# Patient Record
Sex: Female | Born: 1960 | Race: Black or African American | Hispanic: No | Marital: Married | Smoking: Never smoker
Health system: Southern US, Community
[De-identification: ages and names within clinical notes are randomized; demographics above are authoritative.]

## PROBLEM LIST (undated history)

## (undated) ENCOUNTER — Emergency Department (HOSPITAL_COMMUNITY): Admission: EM | Payer: BC Managed Care – PPO | Source: Home / Self Care

## (undated) DIAGNOSIS — I739 Peripheral vascular disease, unspecified: Secondary | ICD-10-CM

## (undated) DIAGNOSIS — D649 Anemia, unspecified: Secondary | ICD-10-CM

## (undated) DIAGNOSIS — R011 Cardiac murmur, unspecified: Secondary | ICD-10-CM

## (undated) DIAGNOSIS — K5900 Constipation, unspecified: Secondary | ICD-10-CM

## (undated) DIAGNOSIS — K219 Gastro-esophageal reflux disease without esophagitis: Secondary | ICD-10-CM

## (undated) DIAGNOSIS — E119 Type 2 diabetes mellitus without complications: Secondary | ICD-10-CM

## (undated) DIAGNOSIS — I10 Essential (primary) hypertension: Secondary | ICD-10-CM

## (undated) DIAGNOSIS — F329 Major depressive disorder, single episode, unspecified: Secondary | ICD-10-CM

## (undated) DIAGNOSIS — I509 Heart failure, unspecified: Secondary | ICD-10-CM

## (undated) DIAGNOSIS — R1032 Left lower quadrant pain: Secondary | ICD-10-CM

## (undated) DIAGNOSIS — R079 Chest pain, unspecified: Secondary | ICD-10-CM

## (undated) HISTORY — DX: Constipation, unspecified: K59.00

## (undated) HISTORY — DX: Type 2 diabetes mellitus without complications: E11.9

## (undated) HISTORY — DX: Major depressive disorder, single episode, unspecified: F32.9

## (undated) HISTORY — DX: Essential (primary) hypertension: I10

## (undated) HISTORY — DX: Left lower quadrant pain: R10.32

## (undated) HISTORY — PX: INGUINAL HERNIA REPAIR: SUR1180

## (undated) HISTORY — DX: Peripheral vascular disease, unspecified: I73.9

## (undated) HISTORY — DX: Chest pain, unspecified: R07.9

## (undated) HISTORY — PX: OVARIAN CYST REMOVAL: SHX89

## (undated) SURGERY — Surgical Case
Anesthesia: *Unknown

---

## 1994-08-10 HISTORY — PX: BREAST SURGERY: SHX581

## 1994-08-10 HISTORY — PX: APPENDECTOMY: SHX54

## 1996-03-28 DIAGNOSIS — N809 Endometriosis, unspecified: Secondary | ICD-10-CM

## 1996-03-28 HISTORY — DX: Endometriosis, unspecified: N80.9

## 1998-03-01 ENCOUNTER — Ambulatory Visit (HOSPITAL_COMMUNITY): Admission: RE | Admit: 1998-03-01 | Discharge: 1998-03-01 | Payer: Self-pay | Admitting: Internal Medicine

## 1998-03-21 LAB — HM COLONOSCOPY: HM Colonoscopy: NORMAL

## 1998-03-27 ENCOUNTER — Ambulatory Visit (HOSPITAL_COMMUNITY): Admission: RE | Admit: 1998-03-27 | Discharge: 1998-03-27 | Payer: Self-pay | Admitting: Gastroenterology

## 1998-04-01 ENCOUNTER — Ambulatory Visit (HOSPITAL_COMMUNITY): Admission: RE | Admit: 1998-04-01 | Discharge: 1998-04-01 | Payer: Self-pay | Admitting: Gastroenterology

## 1998-06-03 ENCOUNTER — Other Ambulatory Visit: Admission: RE | Admit: 1998-06-03 | Discharge: 1998-06-03 | Payer: Self-pay | Admitting: *Deleted

## 1999-05-14 ENCOUNTER — Other Ambulatory Visit: Admission: RE | Admit: 1999-05-14 | Discharge: 1999-05-14 | Payer: Self-pay | Admitting: *Deleted

## 2000-06-02 ENCOUNTER — Other Ambulatory Visit: Admission: RE | Admit: 2000-06-02 | Discharge: 2000-06-02 | Payer: Self-pay | Admitting: *Deleted

## 2000-08-25 ENCOUNTER — Other Ambulatory Visit: Admission: RE | Admit: 2000-08-25 | Discharge: 2000-08-25 | Payer: Self-pay | Admitting: Radiology

## 2000-11-28 ENCOUNTER — Encounter: Payer: Self-pay | Admitting: Emergency Medicine

## 2000-11-28 ENCOUNTER — Emergency Department (HOSPITAL_COMMUNITY): Admission: EM | Admit: 2000-11-28 | Discharge: 2000-11-28 | Payer: Self-pay | Admitting: Emergency Medicine

## 2001-06-30 ENCOUNTER — Other Ambulatory Visit: Admission: RE | Admit: 2001-06-30 | Discharge: 2001-06-30 | Payer: Self-pay | Admitting: *Deleted

## 2002-05-10 ENCOUNTER — Emergency Department (HOSPITAL_COMMUNITY): Admission: EM | Admit: 2002-05-10 | Discharge: 2002-05-10 | Payer: Self-pay | Admitting: Emergency Medicine

## 2002-07-10 ENCOUNTER — Other Ambulatory Visit: Admission: RE | Admit: 2002-07-10 | Discharge: 2002-07-10 | Payer: Self-pay | Admitting: *Deleted

## 2003-07-12 ENCOUNTER — Other Ambulatory Visit: Admission: RE | Admit: 2003-07-12 | Discharge: 2003-07-12 | Payer: Self-pay | Admitting: *Deleted

## 2003-10-05 DIAGNOSIS — R079 Chest pain, unspecified: Secondary | ICD-10-CM

## 2003-10-05 HISTORY — DX: Chest pain, unspecified: R07.9

## 2003-11-23 DIAGNOSIS — I10 Essential (primary) hypertension: Secondary | ICD-10-CM | POA: Insufficient documentation

## 2003-11-23 HISTORY — DX: Essential (primary) hypertension: I10

## 2004-06-24 DIAGNOSIS — K5900 Constipation, unspecified: Secondary | ICD-10-CM

## 2004-06-24 HISTORY — DX: Constipation, unspecified: K59.00

## 2004-07-14 ENCOUNTER — Inpatient Hospital Stay (HOSPITAL_COMMUNITY): Admission: AD | Admit: 2004-07-14 | Discharge: 2004-07-14 | Payer: Self-pay | Admitting: *Deleted

## 2004-10-27 ENCOUNTER — Inpatient Hospital Stay (HOSPITAL_COMMUNITY): Admission: AD | Admit: 2004-10-27 | Discharge: 2004-10-30 | Payer: Self-pay | Admitting: Obstetrics

## 2005-07-20 ENCOUNTER — Other Ambulatory Visit: Admission: RE | Admit: 2005-07-20 | Discharge: 2005-07-20 | Payer: Self-pay | Admitting: *Deleted

## 2005-07-31 DIAGNOSIS — F331 Major depressive disorder, recurrent, moderate: Secondary | ICD-10-CM | POA: Insufficient documentation

## 2005-07-31 DIAGNOSIS — F3289 Other specified depressive episodes: Secondary | ICD-10-CM

## 2005-07-31 DIAGNOSIS — F329 Major depressive disorder, single episode, unspecified: Secondary | ICD-10-CM

## 2005-07-31 HISTORY — DX: Other specified depressive episodes: F32.89

## 2005-07-31 HISTORY — DX: Major depressive disorder, recurrent, moderate: F33.1

## 2005-07-31 HISTORY — DX: Major depressive disorder, single episode, unspecified: F32.9

## 2006-09-08 DIAGNOSIS — E119 Type 2 diabetes mellitus without complications: Secondary | ICD-10-CM

## 2006-09-08 HISTORY — DX: Type 2 diabetes mellitus without complications: E11.9

## 2007-01-15 ENCOUNTER — Encounter: Admission: RE | Admit: 2007-01-15 | Discharge: 2007-01-15 | Payer: Self-pay | Admitting: Family Medicine

## 2011-11-08 ENCOUNTER — Encounter (HOSPITAL_COMMUNITY): Payer: Self-pay | Admitting: *Deleted

## 2011-11-08 ENCOUNTER — Emergency Department (HOSPITAL_COMMUNITY): Payer: BC Managed Care – PPO

## 2011-11-08 ENCOUNTER — Emergency Department (HOSPITAL_COMMUNITY)
Admission: EM | Admit: 2011-11-08 | Discharge: 2011-11-08 | Disposition: A | Discharge status: Home / Self Care | Payer: BC Managed Care – PPO | Attending: Emergency Medicine | Admitting: Emergency Medicine

## 2011-11-08 DIAGNOSIS — R079 Chest pain, unspecified: Secondary | ICD-10-CM | POA: Insufficient documentation

## 2011-11-08 DIAGNOSIS — R7309 Other abnormal glucose: Secondary | ICD-10-CM | POA: Insufficient documentation

## 2011-11-08 DIAGNOSIS — R739 Hyperglycemia, unspecified: Secondary | ICD-10-CM

## 2011-11-08 DIAGNOSIS — R0602 Shortness of breath: Secondary | ICD-10-CM | POA: Insufficient documentation

## 2011-11-08 LAB — CBC
HCT: 40.7 % (ref 36.0–46.0)
Hemoglobin: 13.9 g/dL (ref 12.0–15.0)
MCH: 26.8 pg (ref 26.0–34.0)
MCHC: 34.2 g/dL (ref 30.0–36.0)
MCV: 78.4 fL (ref 78.0–100.0)
Platelets: 254 10*3/uL (ref 150–400)
RBC: 5.19 MIL/uL — ABNORMAL HIGH (ref 3.87–5.11)
RDW: 13 % (ref 11.5–15.5)
WBC: 11 10*3/uL — ABNORMAL HIGH (ref 4.0–10.5)

## 2011-11-08 LAB — BASIC METABOLIC PANEL
BUN: 8 mg/dL (ref 6–23)
CO2: 20 mEq/L (ref 19–32)
Calcium: 9.4 mg/dL (ref 8.4–10.5)
Chloride: 96 mEq/L (ref 96–112)
Creatinine, Ser: 0.56 mg/dL (ref 0.50–1.10)
GFR calc Af Amer: >90 mL/min (ref >90)
GFR calc non Af Amer: >90 mL/min (ref >90)
Glucose, Bld: 380 mg/dL — ABNORMAL HIGH (ref 70–99)
Potassium: 3.8 mEq/L (ref 3.5–5.1)
Sodium: 133 mEq/L — ABNORMAL LOW (ref 135–145)

## 2011-11-08 LAB — D-DIMER, QUANTITATIVE: D-Dimer, Quant: 0.23 ug/mL-FEU (ref 0.00–0.48)

## 2011-11-08 LAB — HEPATIC FUNCTION PANEL
ALT: 10 U/L (ref 0–35)
AST: 12 U/L (ref 0–37)
Albumin: 4 g/dL (ref 3.5–5.2)
Alkaline Phosphatase: 84 U/L (ref 39–117)
Bilirubin, Direct: <0.1 mg/dL (ref 0.0–0.3)
Total Bilirubin: 0.3 mg/dL (ref 0.3–1.2)
Total Protein: 8.2 g/dL (ref 6.0–8.3)

## 2011-11-08 LAB — GLUCOSE, CAPILLARY: Glucose-Capillary: 236 mg/dL — ABNORMAL HIGH (ref 70–99)

## 2011-11-08 LAB — TROPONIN I: Troponin I: <0.3 ng/mL (ref <0.30)

## 2011-11-08 LAB — PRO B NATRIURETIC PEPTIDE: Pro B Natriuretic peptide (BNP): 69 pg/mL (ref 0–125)

## 2011-11-08 MED ORDER — HYDROMORPHONE HCL PF 1 MG/ML IJ SOLN
1.0000 mg | Freq: Once | INTRAMUSCULAR | Status: AC
  Administered 2011-11-08: 1 mg via INTRAVENOUS
  Filled 2011-11-08: qty 1

## 2011-11-08 MED ORDER — ONDANSETRON HCL 4 MG/2ML IJ SOLN
4.0000 mg | Freq: Once | INTRAMUSCULAR | Status: AC
  Administered 2011-11-08: 4 mg via INTRAVENOUS
  Filled 2011-11-08: qty 2

## 2011-11-08 MED ORDER — HYDROCODONE-ACETAMINOPHEN 5-325 MG PO TABS: 1.0000 | ORAL_TABLET | Freq: Four times a day (QID) | ORAL | Status: AC | PRN

## 2011-11-08 MED ORDER — HYDROCODONE-ACETAMINOPHEN 5-325 MG PO TABS
1.0000 | ORAL_TABLET | Freq: Once | ORAL | Status: AC
  Administered 2011-11-08: 1 via ORAL
  Filled 2011-11-08: qty 1

## 2011-11-08 NOTE — Discharge Instructions (Signed)
Follow up with your md next week for recheck

## 2011-11-08 NOTE — ED Notes (Signed)
Reports left sided cp that started this morning while pt was sitting.  C/o sob and diaphoresis.  Denies n/v.

## 2011-11-08 NOTE — ED Notes (Signed)
Pt CBG is 236.

## 2011-11-08 NOTE — ED Provider Notes (Signed)
History     CSN: [id number]  Arrival date & time 11/08/11  1303   First MD Initiated Contact with Patient 11/08/11 1312      Chief Complaint  Patient presents with  . Chest Pain    (Consider location/radiation/quality/duration/timing/severity/associated sxs/prior treatment) Patient is a 51 y.o. female presenting with chest pain. The history is provided by the patient (patient complains of left-sided chest pain worse with inspiration workup base.). No language interpreter was used.  Chest Pain The chest pain began 2 days ago. Chest pain occurs constantly. The chest pain is improving. The pain is associated with breathing. At its most intense, the pain is at 4/10. The pain is currently at 1/10. The severity of the pain is moderate. The quality of the pain is described as aching. The pain does not radiate. Chest pain is worsened by deep breathing. Pertinent negatives for primary symptoms include no fever, no fatigue, no cough and no abdominal pain.  Pertinent negatives for associated symptoms include no claudication and no numbness. She tried nothing for the symptoms.  Pertinent negatives for past medical history include no seizures.     Past Medical History  Diagnosis Date  . Endometriosis     Past Surgical History  Procedure Date  . Inguinal hernia repair   . Ovarian cyst removal     No family history on file.  History  Substance Use Topics  . Smoking status: Never Smoker   . Smokeless tobacco: Not on file  . Alcohol Use: No    OB History    Grav Para Term Preterm Abortions TAB SAB Ect Mult Living                  Review of Systems  Constitutional: Negative for fever and fatigue.  HENT: Negative for congestion, sinus pressure and ear discharge.   Eyes: Negative for discharge.  Respiratory: Negative for cough.   Cardiovascular: Positive for chest pain. Negative for claudication.  Gastrointestinal: Negative for abdominal pain and diarrhea.  Genitourinary: Negative  for frequency and hematuria.  Musculoskeletal: Negative for back pain.  Skin: Negative for rash.  Neurological: Negative for seizures, numbness and headaches.  Hematological: Negative.   Psychiatric/Behavioral: Negative for hallucinations.    Allergies  Aspirin and Darvocet  Home Medications   Current Outpatient Rx  Name Route Sig Dispense Refill  . IBUPROFEN 200 MG PO TABS Oral Take 200 mg by mouth every 6 (six) hours as needed. Pain///sometimes she will take 2 tabs if pain is severe    . HYDROCODONE-ACETAMINOPHEN 5-325 MG PO TABS Oral Take 1 tablet by mouth every 6 (six) hours as needed for pain. 20 tablet 0    BP 158/94  Pulse 90  Temp(Src) 98 F (36.7 C) (Oral)  Resp 18  Ht 5\' 4"  (1.626 m)  Wt 124 lb (56.246 kg)  BMI 21.28 kg/m2  SpO2 100%  LMP 10/28/2011  Physical Exam  Constitutional: She is oriented to person, place, and time. She appears well-developed.  HENT:  Head: Normocephalic and atraumatic.  Eyes: Conjunctivae and EOM are normal. No scleral icterus.  Neck: Neck supple. No thyromegaly present.  Cardiovascular: Normal rate and regular rhythm.  Exam reveals no gallop and no friction rub.   No murmur heard. Pulmonary/Chest: No stridor. She has no wheezes. She has no rales. She exhibits no tenderness.  Abdominal: She exhibits no distension. There is no tenderness. There is no rebound.  Musculoskeletal: Normal range of motion. She exhibits no edema.  Lymphadenopathy:  She has no cervical adenopathy.  Neurological: She is oriented to person, place, and time. Coordination normal.  Skin: No rash noted. No erythema.  Psychiatric: She has a normal mood and affect. Her behavior is normal.    ED Course  Procedures (including critical care time)  Labs Reviewed  CBC - Abnormal; Notable for the following:    WBC 11.0 (*)    RBC 5.19 (*)    All other components within normal limits  BASIC METABOLIC PANEL - Abnormal; Notable for the following:    Sodium 133 (*)     Glucose, Bld 380 (*)    All other components within normal limits  GLUCOSE, CAPILLARY - Abnormal; Notable for the following:    Glucose-Capillary 236 (*)    All other components within normal limits  PRO B NATRIURETIC PEPTIDE  TROPONIN I  HEPATIC FUNCTION PANEL  D-DIMER, QUANTITATIVE   Chest Portable 1 View  11/08/2011  *RADIOLOGY REPORT*  Clinical Data: Left-sided chest pain.  PORTABLE CHEST - 1 VIEW  Comparison: Chest x-ray 09/08/2006.  Findings: Lung volumes are normal.  No consolidative airspace disease.  No pleural effusions.  No pneumothorax.  No pulmonary nodule or mass noted.  Pulmonary vasculature and the cardiomediastinal silhouette are within normal limits.  IMPRESSION: 1. No radiographic evidence of acute cardiopulmonary disease.  Original Report Authenticated By: Florencia Reasons, M.D.     1. Chest pain   2. Elevated blood sugar      Date: 11/08/2011  Rate: 65  Rhythm: normal sinus rhythm  QRS Axis: normal  Intervals: normal  ST/T Wave abnormalities: normal  Conduction Disutrbances:none  Narrative Interpretation: lvh  Old EKG Reviewed: none available    MDM   Chest pain non cardiac with normal d-dimer.  Will tx synptoms  pleuritis       Benny Lennert, MD 11/08/11 1616

## 2012-05-11 LAB — HM MAMMOGRAPHY: HM Mammogram: NEGATIVE

## 2012-09-06 ENCOUNTER — Ambulatory Visit: Payer: Self-pay

## 2012-09-15 ENCOUNTER — Other Ambulatory Visit: Payer: Self-pay | Admitting: Internal Medicine

## 2012-09-15 DIAGNOSIS — M25512 Pain in left shoulder: Secondary | ICD-10-CM

## 2012-09-16 ENCOUNTER — Other Ambulatory Visit: Payer: BC Managed Care – PPO

## 2012-09-20 ENCOUNTER — Encounter: Payer: BC Managed Care – PPO | Attending: Endocrinology | Admitting: *Deleted

## 2012-09-20 DIAGNOSIS — Z713 Dietary counseling and surveillance: Secondary | ICD-10-CM | POA: Insufficient documentation

## 2012-09-20 DIAGNOSIS — E119 Type 2 diabetes mellitus without complications: Secondary | ICD-10-CM | POA: Insufficient documentation

## 2012-09-25 ENCOUNTER — Encounter: Payer: Self-pay | Admitting: *Deleted

## 2012-09-25 NOTE — Patient Instructions (Signed)

## 2012-09-25 NOTE — Progress Notes (Signed)
Patient was seen on 09/20/2012 for the first of a series of three diabetes self-management courses at the [facility]. Patient's most recent A1c was 13.7 % on 08/25/2012 The following learning objectives were met by the patient during this course:   Defines the role of glucose and insulin  Identifies type of diabetes and pathophysiology  Defines the diagnostic criteria for diabetes and prediabetes  States the risk factors for Type 2 Diabetes  States the symptoms of Type 2 Diabetes  Defines Type 2 Diabetes treatment goals  Defines Type 2 Diabetes treatment options  States the rationale for glucose monitoring  Identifies A1C, glucose targets, and testing times  Identifies proper sharps disposal  Defines the purpose of a diabetes food plan  Identifies carbohydrate food groups  Defines effects of carbohydrate foods on glucose levels  Identifies carbohydrate choices/grams/food labels  States benefits of physical activity and effect on glucose  Review of suggested activity guidelines  Handouts given during class include:  Type 2 Diabetes: Basics Book  My Food Plan Book  Food and Activity Log   Follow-Up Plan: Core Class 2

## 2012-10-11 ENCOUNTER — Ambulatory Visit: Payer: BC Managed Care – PPO

## 2013-03-15 ENCOUNTER — Ambulatory Visit: Payer: Self-pay | Admitting: Internal Medicine

## 2013-03-28 ENCOUNTER — Encounter: Payer: Self-pay | Admitting: *Deleted

## 2013-03-28 ENCOUNTER — Ambulatory Visit (INDEPENDENT_AMBULATORY_CARE_PROVIDER_SITE_OTHER): Payer: BC Managed Care – PPO | Admitting: Internal Medicine

## 2013-03-28 ENCOUNTER — Encounter: Payer: Self-pay | Admitting: Internal Medicine

## 2013-03-28 VITALS — BP 152/98 | HR 55 | Temp 100.2°F | Resp 18 | Ht 65.0 in | Wt 123.4 lb

## 2013-03-28 DIAGNOSIS — N809 Endometriosis, unspecified: Secondary | ICD-10-CM

## 2013-03-28 DIAGNOSIS — F329 Major depressive disorder, single episode, unspecified: Secondary | ICD-10-CM

## 2013-03-28 DIAGNOSIS — L0293 Carbuncle, unspecified: Secondary | ICD-10-CM

## 2013-03-28 DIAGNOSIS — I10 Essential (primary) hypertension: Secondary | ICD-10-CM

## 2013-03-28 DIAGNOSIS — E119 Type 2 diabetes mellitus without complications: Secondary | ICD-10-CM

## 2013-03-28 DIAGNOSIS — L0292 Furuncle, unspecified: Secondary | ICD-10-CM

## 2013-03-28 MED ORDER — HYDROCODONE-ACETAMINOPHEN 5-325 MG PO TABS
ORAL_TABLET | ORAL | Status: DC
Start: 2013-03-28 — End: 2013-12-27

## 2013-03-28 MED ORDER — CEPHALEXIN 500 MG PO CAPS: ORAL_CAPSULE | ORAL | Status: DC

## 2013-03-28 NOTE — Progress Notes (Signed)
  Subjective:    Patient ID: Brooke [name], female    DOB: [birth year], 52 y.o.   MRN: [id number]  HPI Has not been using metformin or glyburide/ metformin. Made her have diarrhea. Has been using Glucosil from [facility] (herbal product).  Has had welts and carbuncles breaking out. Largest in on her abd. Has not used any antibiotics.  Current Outpatient Prescriptions on File Prior to Visit  Medication Sig Dispense Refill  . glyBURIDE-metformin (GLUCOVANCE) 2.5-500 MG per tablet Take 1 tablet by mouth 2 (two) times daily with a meal.      . ibuprofen (ADVIL,MOTRIN) 200 MG tablet Take 200 mg by mouth every 6 (six) hours as needed. Pain///sometimes she will take 2 tabs if pain is severe       No current facility-administered medications on file prior to visit.    Review of Systems  Constitutional: Negative.   HENT: Negative.   Eyes: Negative.   Respiratory: Negative.   Cardiovascular: Negative.   Endocrine: Negative for heat intolerance, polydipsia, polyphagia and polyuria.       Diabetic  Genitourinary: Negative.   Musculoskeletal: Negative.   Allergic/Immunologic: Negative.   Neurological: Negative.   Hematological: Negative.   Psychiatric/Behavioral: Negative.        Objective:BP 152/98  Pulse 55  Temp(Src) 100.2 F (37.9 C) (Oral)  Resp 18  Ht 5\' 5"  (1.651 m)  Wt 123 lb 6.4 oz (55.974 kg)  BMI 20.53 kg/m2  SpO2 95%    Physical Exam  Constitutional: She is oriented to person, place, and time. She appears well-developed and well-nourished. No distress.  HENT:  Head: Normocephalic and atraumatic.  Right Ear: External ear normal.  Left Ear: External ear normal.  Nose: Nose normal.  Mouth/Throat: No oropharyngeal exudate.  Eyes: Conjunctivae and EOM are normal. Pupils are equal, round, and reactive to light. Right eye exhibits no discharge. Left eye exhibits no discharge.  Neck: Neck supple. No JVD present. No tracheal deviation present. No thyromegaly present.   Cardiovascular: Normal rate, regular rhythm, normal heart sounds and intact distal pulses.  Exam reveals no friction rub.   No murmur heard. Pulmonary/Chest: Breath sounds normal. No respiratory distress. She has no wheezes. She exhibits no tenderness.  Abdominal: Bowel sounds are normal. She exhibits no distension and no mass. There is no tenderness.  Musculoskeletal: Normal range of motion. She exhibits no edema and no tenderness.  Lymphadenopathy:    She has no cervical adenopathy.  Neurological: She is alert and oriented to person, place, and time. She has normal reflexes. No cranial nerve deficit. Coordination normal.  Skin: No rash noted. No erythema. No pallor.  Psychiatric: She has a normal mood and affect. Her behavior is normal. Judgment and thought content normal.      Abstract on 03/28/2013  Component Date Value Range Status  . HM Mammogram 05/11/2012 Negative   Final  . HM Colonoscopy 03/21/1998 normal   Final       Assessment & Plan:  Diabetes mellitus without complication - Plan: Hemoglobin A1c, Comprehensive metabolic panel, Lipid panel  Hypertension: home BP all normal per patient  Depressive disorder, not elsewhere classified: doing OK  Endometriosis: quiet at this time. Still gets flares. Using pain medication at that time

## 2013-03-28 NOTE — Addendum Note (Signed)
Addended byMarvia Pickles on: 03/28/2013 03:17 PM   Modules accepted: Orders

## 2013-03-28 NOTE — Patient Instructions (Signed)
Continue current medications. 

## 2013-03-29 LAB — HEMOGLOBIN A1C
Est. average glucose Bld gHb Est-mCnc: 275 mg/dL
Hgb A1c MFr Bld: 11.2 % — ABNORMAL HIGH (ref 4.8–5.6)

## 2013-03-29 LAB — COMPREHENSIVE METABOLIC PANEL
ALT: 7 IU/L (ref 0–32)
AST: 14 IU/L (ref 0–40)
Albumin/Globulin Ratio: 1.6 (ref 1.1–2.5)
Albumin: 4.5 g/dL (ref 3.5–5.5)
Alkaline Phosphatase: 89 IU/L (ref 39–117)
BUN/Creatinine Ratio: 15 (ref 9–23)
BUN: 9 mg/dL (ref 6–24)
CO2: 25 mmol/L (ref 18–29)
Calcium: 9.2 mg/dL (ref 8.7–10.2)
Chloride: 98 mmol/L (ref 97–108)
Creatinine, Ser: 0.59 mg/dL (ref 0.57–1.00)
GFR calc Af Amer: 124 mL/min/{1.73_m2} (ref >59)
GFR calc non Af Amer: 107 mL/min/{1.73_m2} (ref >59)
Globulin, Total: 2.9 g/dL (ref 1.5–4.5)
Glucose: 252 mg/dL — ABNORMAL HIGH (ref 65–99)
Potassium: 3.7 mmol/L (ref 3.5–5.2)
Sodium: 139 mmol/L (ref 134–144)
Total Bilirubin: 0.2 mg/dL (ref 0.0–1.2)
Total Protein: 7.4 g/dL (ref 6.0–8.5)

## 2013-03-29 LAB — LIPID PANEL
Chol/HDL Ratio: 5.3 ratio units — ABNORMAL HIGH (ref 0.0–4.4)
Cholesterol, Total: 207 mg/dL — ABNORMAL HIGH (ref 100–199)
HDL: 39 mg/dL — ABNORMAL LOW (ref >39)
LDL Calculated: 102 mg/dL — ABNORMAL HIGH (ref 0–99)
Triglycerides: 331 mg/dL — ABNORMAL HIGH (ref 0–149)
VLDL Cholesterol Cal: 66 mg/dL — ABNORMAL HIGH (ref 5–40)

## 2013-04-04 ENCOUNTER — Telehealth: Payer: Self-pay | Admitting: *Deleted

## 2013-04-04 NOTE — Telephone Encounter (Signed)
Patient states that she will go back on Glyburide/metformin 5mg . Can call into [facility] if you prescribe. Please Advise.

## 2013-04-05 ENCOUNTER — Other Ambulatory Visit: Payer: Self-pay | Admitting: *Deleted

## 2013-04-05 MED ORDER — GLYBURIDE-METFORMIN 5-500 MG PO TABS: ORAL_TABLET | ORAL | Status: DC

## 2013-04-05 NOTE — Telephone Encounter (Signed)
Called in [facility] into [facility] # [phone] for Glyburide/metformin 5-500 once daily.

## 2013-04-05 NOTE — Telephone Encounter (Signed)
Call in order for glyburide 5 mg daily and metformin 500 mg daily.

## 2013-06-22 ENCOUNTER — Encounter: Payer: Self-pay | Admitting: Internal Medicine

## 2013-07-24 ENCOUNTER — Other Ambulatory Visit: Payer: BC Managed Care – PPO

## 2013-07-26 ENCOUNTER — Ambulatory Visit: Payer: BC Managed Care – PPO | Admitting: Internal Medicine

## 2013-10-25 ENCOUNTER — Ambulatory Visit: Payer: BC Managed Care – PPO | Admitting: Internal Medicine

## 2013-11-21 ENCOUNTER — Ambulatory Visit: Payer: BC Managed Care – PPO | Admitting: Internal Medicine

## 2013-11-28 ENCOUNTER — Ambulatory Visit: Payer: BC Managed Care – PPO | Admitting: Internal Medicine

## 2013-12-27 ENCOUNTER — Ambulatory Visit (INDEPENDENT_AMBULATORY_CARE_PROVIDER_SITE_OTHER): Payer: BC Managed Care – PPO | Admitting: Internal Medicine

## 2013-12-27 ENCOUNTER — Encounter: Payer: Self-pay | Admitting: Internal Medicine

## 2013-12-27 VITALS — BP 158/98 | HR 94 | Temp 99.5°F | Resp 20 | Ht 65.0 in | Wt 122.8 lb

## 2013-12-27 DIAGNOSIS — E119 Type 2 diabetes mellitus without complications: Secondary | ICD-10-CM

## 2013-12-27 DIAGNOSIS — I10 Essential (primary) hypertension: Secondary | ICD-10-CM

## 2013-12-27 DIAGNOSIS — N809 Endometriosis, unspecified: Secondary | ICD-10-CM

## 2013-12-27 MED ORDER — HYDROCODONE-ACETAMINOPHEN 5-325 MG PO TABS: ORAL_TABLET | ORAL | Status: DC

## 2013-12-27 NOTE — Progress Notes (Signed)
Patient ID: Brooke [name], female   DOB: 21-Mar-1961, 53 y.o.   MRN: [id number]    Location:  [facility]  Place of Service: OFFICE    Allergies  Allergen Reactions  . Aspirin     Intolerance (aspirin sensitive stomach)  . Darvocet [Propoxyphene N-Acetaminophen]   . Erythromycin   . Ibuprofen   . Ivp Dye [Iodinated Diagnostic Agents]     Chief Complaint  Patient presents with  . Follow-up    HPI:  Scarred area of the right 4th finger mid shaft of the proximal phalanx. Had an infection there several weeks ago.  Off medications for diabetes.  Endometriosis continues to be active with variable pains on most days.  Continues to deal with the stress of her chronically ill husband who is back on dialysis after his renal transplant failed.  Medications: Patient's Medications  New Prescriptions   No medications on file  Previous Medications   CEPHALEXIN (KEFLEX) 500 MG CAPSULE    One 3 times daily for infection   CHROMIUM-CINNAMON (CINNAMON PLUS CHROMIUM PO)    Take 1,000 mg by mouth 2 (two) times daily with a meal.   FERROUS SULFATE (IRON SUPPLEMENT PO)    Take by mouth.   FOLIC ACID (FOLVITE) 706 MCG TABLET    Take 400 mcg by mouth daily.   GLYBURIDE-METFORMIN (GLUCOVANCE) 5-500 MG PER TABLET    Take one tablet once daily to control blood sugar   IBUPROFEN (ADVIL,MOTRIN) 200 MG TABLET    Take 200 mg by mouth every 6 (six) hours as needed. Pain///sometimes she will take 2 tabs if pain is severe   MAGNESIUM OXIDE (MAG-OX) 400 MG TABLET    Take 400 mg by mouth daily. Take 2 tablets daily to supplement magnesium.   MILK THISTLE EXTRACT PO    Take by mouth.   MISC NATURAL PRODUCTS (ECHINACEA GOLDENSEAL PLUS PO)    Take by mouth. Take 2 capsules once daily.   MULTIPLE VITAMINS-MINERALS (ADVANCED DIABETIC MULTIVITAMIN PO)    Take by mouth. Take 1 packet once daily.   VITAMIN C (ASCORBIC ACID) 500 MG TABLET    Take 1,000 mg by mouth 3 (three) times daily. Take 1 tablet once daily  Modified  Medications   Modified Medication Previous Medication   HYDROCODONE-ACETAMINOPHEN (NORCO/VICODIN) 5-325 MG PER TABLET HYDROcodone-acetaminophen (NORCO/VICODIN) 5-325 MG per tablet      One every 6 hours if needed for pain    One every 6 hours if needed for pain  Discontinued Medications   No medications on file     Review of Systems  Constitutional: Negative.   HENT: Negative.   Eyes: Negative.   Respiratory: Negative.   Cardiovascular: Negative.   Endocrine: Negative for heat intolerance, polydipsia, polyphagia and polyuria.       Diabetic  Genitourinary: Negative.   Musculoskeletal: Negative.   Allergic/Immunologic: Negative.   Neurological: Negative.   Hematological: Negative.   Psychiatric/Behavioral: Negative.     Filed Vitals:   12/27/13 1522  BP: 158/98  Pulse: 94  Temp: 99.5 F (37.5 C)  TempSrc: Oral  Resp: 20  Height: 5\' 5"  (1.651 m)  Weight: 122 lb 12.8 oz (55.702 kg)  SpO2: 95%   Body mass index is 20.44 kg/(m^2).  Physical Exam  Constitutional: She is oriented to person, place, and time. She appears well-developed and well-nourished. No distress.  HENT:  Head: Normocephalic and atraumatic.  Right Ear: External ear normal.  Left Ear: External ear normal.  Nose: Nose normal.  Mouth/Throat:  No oropharyngeal exudate.  Eyes: Conjunctivae and EOM are normal. Pupils are equal, round, and reactive to light. Right eye exhibits no discharge. Left eye exhibits no discharge.  Neck: Neck supple. No JVD present. No tracheal deviation present. No thyromegaly present.  Cardiovascular: Normal rate, regular rhythm, normal heart sounds and intact distal pulses.  Exam reveals no friction rub.   No murmur heard. Pulmonary/Chest: Breath sounds normal. No respiratory distress. She has no wheezes. She exhibits no tenderness.  Abdominal: Bowel sounds are normal. She exhibits no distension and no mass. There is no tenderness.  Bilateral inguinal adenopathy with nodes a little  larger on the left.  Musculoskeletal: Normal range of motion. She exhibits no edema and no tenderness.  Lymphadenopathy:    She has no cervical adenopathy.  Neurological: She is alert and oriented to person, place, and time. She has normal reflexes. No cranial nerve deficit. Coordination normal.  Skin: No rash noted. No erythema. No pallor.  Scarred nodular feeling area of the right 4th finger proximal phalanx dorsum.  Psychiatric: She has a normal mood and affect. Her behavior is normal. Judgment and thought content normal.     Labs reviewed: No visits with results within 3 Month(s) from this visit. Latest known visit with results is:  Office Visit on 03/28/2013  Component Date Value Ref Range Status  . Hemoglobin A1C 03/28/2013 11.2* 4.8 - 5.6 % Final   Comment:          Increased risk for diabetes: 5.7 - 6.4                                   Diabetes: >6.4                                   Glycemic control for adults with diabetes: <7.0  . Estimated average glucose 03/28/2013 275   Final  . Glucose 03/28/2013 252* 65 - 99 mg/dL Final  . BUN 03/28/2013 9  6 - 24 mg/dL Final  . Creatinine, Ser 03/28/2013 0.59  0.57 - 1.00 mg/dL Final  . GFR calc non Af Amer 03/28/2013 107  >59 mL/min/1.73 Final  . GFR calc Af Amer 03/28/2013 124  >59 mL/min/1.73 Final  . BUN/Creatinine Ratio 03/28/2013 15  9 - 23 Final  . Sodium 03/28/2013 139  134 - 144 mmol/L Final  . Potassium 03/28/2013 3.7  3.5 - 5.2 mmol/L Final  . Chloride 03/28/2013 98  97 - 108 mmol/L Final  . CO2 03/28/2013 25  18 - 29 mmol/L Final  . Calcium 03/28/2013 9.2  8.7 - 10.2 mg/dL Final  . Total Protein 03/28/2013 7.4  6.0 - 8.5 g/dL Final  . Albumin 03/28/2013 4.5  3.5 - 5.5 g/dL Final  . Globulin, Total 03/28/2013 2.9  1.5 - 4.5 g/dL Final  . Albumin/Globulin Ratio 03/28/2013 1.6  1.1 - 2.5 Final  . Total Bilirubin 03/28/2013 0.2  0.0 - 1.2 mg/dL Final  . Alkaline Phosphatase 03/28/2013 89  39 - 117 IU/L Final  . AST  03/28/2013 14  0 - 40 IU/L Final  . ALT 03/28/2013 7  0 - 32 IU/L Final  . Cholesterol, Total 03/28/2013 207* 100 - 199 mg/dL Final  . Triglycerides 03/28/2013 331* 0 - 149 mg/dL Final  . HDL 03/28/2013 39* >39 mg/dL Final   Comment: According to ATP-III Guidelines,  HDL-C >59 mg/dL is considered a                          negative risk factor for CHD.  Marland Kitchen VLDL Cholesterol Cal 03/28/2013 66* 5 - 40 mg/dL Final  . LDL Calculated 03/28/2013 102* 0 - 99 mg/dL Final  . Chol/HDL Ratio 03/28/2013 5.3* 0.0 - 4.4 ratio units Final      Assessment/Plan  1. Endometriosis Likely cause of the inguinal adenopathy - HYDROcodone-acetaminophen (NORCO/VICODIN) 5-325 MG per tablet; One every 6 hours if needed for pain  Dispense: 100 tablet; Refill: 0  2. Diabetes mellitus without complication Needs to return for lab - Hemoglobin A1c; Future - CMP; Future  3. Hypertension moderate elevation. She prefers to hold off on medicating. Asymptomatic.

## 2013-12-27 NOTE — Patient Instructions (Signed)
Return for lab work.

## 2014-01-16 ENCOUNTER — Other Ambulatory Visit: Payer: Self-pay | Admitting: Internal Medicine

## 2014-01-16 DIAGNOSIS — L0390 Cellulitis, unspecified: Secondary | ICD-10-CM

## 2014-01-16 MED ORDER — DOXYCYCLINE HYCLATE 100 MG PO TABS: ORAL_TABLET | ORAL | Status: DC

## 2014-01-23 ENCOUNTER — Encounter: Payer: Self-pay | Admitting: Internal Medicine

## 2014-05-22 LAB — HM MAMMOGRAPHY: HM Mammogram: NEGATIVE

## 2014-05-23 ENCOUNTER — Encounter: Payer: Self-pay | Admitting: *Deleted

## 2014-07-04 ENCOUNTER — Encounter: Payer: Self-pay | Admitting: Internal Medicine

## 2014-07-04 ENCOUNTER — Ambulatory Visit (INDEPENDENT_AMBULATORY_CARE_PROVIDER_SITE_OTHER): Payer: BC Managed Care – PPO | Admitting: Internal Medicine

## 2014-07-04 VITALS — BP 160/90 | HR 93 | Temp 98.8°F | Ht 65.0 in | Wt 120.6 lb

## 2014-07-04 DIAGNOSIS — I10 Essential (primary) hypertension: Secondary | ICD-10-CM

## 2014-07-04 DIAGNOSIS — N809 Endometriosis, unspecified: Secondary | ICD-10-CM

## 2014-07-04 DIAGNOSIS — E119 Type 2 diabetes mellitus without complications: Secondary | ICD-10-CM

## 2014-07-04 DIAGNOSIS — F411 Generalized anxiety disorder: Secondary | ICD-10-CM

## 2014-07-04 MED ORDER — ALPRAZOLAM 0.25 MG PO TABS: ORAL_TABLET | ORAL | Status: DC

## 2014-07-04 NOTE — Progress Notes (Signed)
Patient ID: Brooke [name], female   DOB: 06-Apr-1961, 53 y.o.   MRN: [id number]    Facility  [facility]    Place of Service:   OFFICE   Allergies  Allergen Reactions  . Aspirin     Intolerance (aspirin sensitive stomach)  . Darvocet [Propoxyphene N-Acetaminophen]   . Erythromycin   . Ibuprofen   . Ivp Dye [Iodinated Diagnostic Agents]     Chief Complaint  Patient presents with  . Follow-up    6 month follow up    HPI:  She went on Glucocil, an OTC glucose lowering product. Last sugar was about 160 mg% 25mo ago. She says the glyburide/ glucophage would cause sugar to bottom out and caused diarrhea.  Diabetes mellitus without complication - Last Hemoglobin A1C was in 2014  Endometriosis: states it seems more active. She thinks she may need surgery in the future. She is under the care of a gyn.  Essential hypertension: controlled  Generalized anxiety disorder - very stressed by difficult family situation with her mother still forgetting and at odds with patient/s husband. Patient's husband is on dialysis.    Medications: Patient's Medications  New Prescriptions   No medications on file  Previous Medications   CHROMIUM-CINNAMON (CINNAMON PLUS CHROMIUM PO)    Take 1,000 mg by mouth 2 (two) times daily with a meal.   FOLIC ACID (FOLVITE) 563 MCG TABLET    Take 400 mcg by mouth daily.   HYDROCODONE-ACETAMINOPHEN (NORCO/VICODIN) 5-325 MG PER TABLET    One every 6 hours if needed for pain   MILK THISTLE EXTRACT PO    Take by mouth.   MISC NATURAL PRODUCTS (ECHINACEA GOLDENSEAL PLUS PO)    Take by mouth. Take 2 capsules once daily.   MULTIPLE VITAMINS-MINERALS (ADVANCED DIABETIC MULTIVITAMIN PO)    Take by mouth. Take 1 packet once daily.   VITAMIN C (ASCORBIC ACID) 500 MG TABLET    Take 1,000 mg by mouth 3 (three) times daily. Take 1 tablet once daily  Modified Medications   No medications on file  Discontinued Medications   CEPHALEXIN (KEFLEX) 500 MG CAPSULE    One 3 times daily  for infection   DOXYCYCLINE (VIBRA-TABS) 100 MG TABLET    One twice daily for infection   FERROUS SULFATE (IRON SUPPLEMENT PO)    Take by mouth.   GLYBURIDE-METFORMIN (GLUCOVANCE) 5-500 MG PER TABLET    Take one tablet once daily to control blood sugar   IBUPROFEN (ADVIL,MOTRIN) 200 MG TABLET    Take 200 mg by mouth every 6 (six) hours as needed. Pain///sometimes she will take 2 tabs if pain is severe   MAGNESIUM OXIDE (MAG-OX) 400 MG TABLET    Take 400 mg by mouth daily. Take 2 tablets daily to supplement magnesium.     Review of Systems  Constitutional: Negative.   HENT: Negative.   Eyes: Negative.   Respiratory: Negative.   Cardiovascular: Negative.   Endocrine: Negative for heat intolerance, polydipsia, polyphagia and polyuria.       Diabetic  Genitourinary: Negative.   Musculoskeletal: Negative.   Allergic/Immunologic: Negative.   Neurological: Negative.   Hematological: Negative.   Psychiatric/Behavioral: Negative.     Filed Vitals:   07/04/14 1505  BP: 160/90  Pulse: 93  Temp: 98.8 F (37.1 C)  TempSrc: Oral  Height: 5\' 5"  (1.651 m)  Weight: 120 lb 9.6 oz (54.704 kg)  SpO2: 99%   Body mass index is 20.07 kg/(m^2).  Physical Exam  Constitutional: She is  oriented to person, place, and time. She appears well-developed and well-nourished. No distress.  HENT:  Head: Normocephalic and atraumatic.  Right Ear: External ear normal.  Left Ear: External ear normal.  Nose: Nose normal.  Mouth/Throat: No oropharyngeal exudate.  Eyes: Conjunctivae and EOM are normal. Pupils are equal, round, and reactive to light. Right eye exhibits no discharge. Left eye exhibits no discharge.  Neck: Neck supple. No JVD present. No tracheal deviation present. No thyromegaly present.  Cardiovascular: Normal rate, regular rhythm, normal heart sounds and intact distal pulses.  Exam reveals no friction rub.   No murmur heard. Pulmonary/Chest: Breath sounds normal. No respiratory distress. She  has no wheezes. She exhibits no tenderness.  Abdominal: Bowel sounds are normal. She exhibits no distension and no mass. There is no tenderness.  Musculoskeletal: Normal range of motion. She exhibits no edema or tenderness.  Lymphadenopathy:    She has no cervical adenopathy.  Neurological: She is alert and oriented to person, place, and time. She has normal reflexes. No cranial nerve deficit. Coordination normal.  Skin: No rash noted. No erythema. No pallor.  Scarred nodular feeling area of the right 4th finger proximal phalanx dorsum.  Psychiatric: She has a normal mood and affect. Her behavior is normal. Judgment and thought content normal.     Labs reviewed: Abstract on 05/23/2014  Component Date Value Ref Range Status  . HM Mammogram 05/22/2014 Negative done at [facility]   Final   Lab Results  Component Value Date   HGBA1C 11.2* 03/28/2013   CMP Latest Ref Rng 03/28/2013 11/08/2011  Glucose 65 - 99 mg/dL 252(H) 380(H)  BUN 6 - 24 mg/dL 9 8  Creatinine 0.57 - 1.00 mg/dL 0.59 0.56  Sodium 134 - 144 mmol/L 139 133(L)  Potassium 3.5 - 5.2 mmol/L 3.7 3.8  Chloride 97 - 108 mmol/L 98 96  CO2 18 - 29 mmol/L 25 20  Calcium 8.7 - 10.2 mg/dL 9.2 9.4  Total Protein 6.0 - 8.5 g/dL 7.4 8.2  Albumin 3.5 - 5.5 g/dL 4.5 -  Total Bilirubin 0.0 - 1.2 mg/dL 0.2 0.3  Alkaline Phos 39 - 117 IU/L 89 84  AST 0 - 40 IU/L 14 12  ALT 0 - 32 IU/L 7 10        Assessment/Plan 1. Diabetes mellitus without complication - Hemoglobin A1C  2. Endometriosis continue with GYN  3. Essential hypertension Modest elevation in SBP that she blames on a stressful week  4. Generalized anxiety disorder - ALPRAZolam (XANAX) 0.25 MG tablet; One up to 3 times daily for anxiety  Dispense: 100 tablet; Refill: 0

## 2014-07-05 LAB — HEMOGLOBIN A1C
Est. average glucose Bld gHb Est-mCnc: 252 mg/dL
Hgb A1c MFr Bld: 10.4 % — ABNORMAL HIGH (ref 4.8–5.6)

## 2014-07-09 ENCOUNTER — Telehealth: Payer: Self-pay | Admitting: *Deleted

## 2014-07-09 NOTE — Telephone Encounter (Signed)
-----   Message from Estill Dooms, MD sent at 07/09/2014  1:34 PM EST ----- Call patient. Hemoglobin A1c is still very high at 10.4. I would strongly suggest that she resume metformin 1000 mg daily. She should have a follow-up A1c in 3 months.

## 2015-01-30 ENCOUNTER — Encounter: Payer: Self-pay | Admitting: Internal Medicine

## 2015-01-30 ENCOUNTER — Ambulatory Visit (INDEPENDENT_AMBULATORY_CARE_PROVIDER_SITE_OTHER): Payer: BLUE CROSS/BLUE SHIELD | Admitting: Internal Medicine

## 2015-01-30 VITALS — BP 140/90 | HR 105 | Temp 99.1°F | Resp 18 | Ht 65.0 in | Wt 112.4 lb

## 2015-01-30 DIAGNOSIS — F411 Generalized anxiety disorder: Secondary | ICD-10-CM | POA: Diagnosis not present

## 2015-01-30 DIAGNOSIS — I10 Essential (primary) hypertension: Secondary | ICD-10-CM

## 2015-01-30 DIAGNOSIS — E119 Type 2 diabetes mellitus without complications: Secondary | ICD-10-CM | POA: Diagnosis not present

## 2015-01-30 DIAGNOSIS — L821 Other seborrheic keratosis: Secondary | ICD-10-CM

## 2015-01-30 DIAGNOSIS — E785 Hyperlipidemia, unspecified: Secondary | ICD-10-CM | POA: Diagnosis not present

## 2015-01-30 DIAGNOSIS — N809 Endometriosis, unspecified: Secondary | ICD-10-CM | POA: Diagnosis not present

## 2015-01-30 MED ORDER — ALPRAZOLAM 0.25 MG PO TABS: ORAL_TABLET | ORAL | Status: DC

## 2015-01-30 MED ORDER — HYDROCODONE-ACETAMINOPHEN 5-325 MG PO TABS: ORAL_TABLET | ORAL | Status: DC

## 2015-01-30 NOTE — Progress Notes (Signed)
Patient ID: Brooke [name], female   DOB: 1960/09/21, 54 y.o.   MRN: [id number]    Facility  [facility]    Place of Service:   OFFICE    Allergies  Allergen Reactions  . Aspirin     Intolerance (aspirin sensitive stomach)  . Darvocet [Propoxyphene N-Acetaminophen]   . Erythromycin   . Ibuprofen   . Ivp Dye [Iodinated Diagnostic Agents]     Chief Complaint  Patient presents with  . Medical Management of Chronic Issues    mole on left breast,referral for colonoscopy    HPI:  Diabetes mellitus without complication: non compliant with diet. Denies polyphagia, polydipsia , or excessive urination.  Essential hypertension: controlled  Generalized anxiety disorder - benefits from ALPRAZolam (XANAX) 0.25 MG tablet  Endometriosis - continues with intermittent abdomiinal pains and benerfits from  HYDROcodone-acetaminophen (NORCO/VICODIN) 5-325 MG per tablet  Seborrheic keratoses: large black one at the left upper breast. Not irritated or bleeding  Hyperlipidemia: hx of mild increase in LDL. Has not been checked in a while.    Medications: Patient's Medications  New Prescriptions   No medications on file  Previous Medications   CHROMIUM-CINNAMON (CINNAMON PLUS CHROMIUM PO)    Take 1,000 mg by mouth 2 (two) times daily with a meal.   FOLIC ACID (FOLVITE) 627 MCG TABLET    Take 400 mcg by mouth daily.   MILK THISTLE EXTRACT PO    Take by mouth.   MISC NATURAL PRODUCTS (ECHINACEA GOLDENSEAL PLUS PO)    Take by mouth. Take 2 capsules once daily.   MULTIPLE VITAMINS-MINERALS (ADVANCED DIABETIC MULTIVITAMIN PO)    Take by mouth. Take 1 packet once daily.   NUTRITIONAL SUPPLEMENTS (NUTRITIONAL SUPPLEMENT PO)    Take by mouth. Glucocil= 2 tablets BID   VITAMIN C (ASCORBIC ACID) 500 MG TABLET    Take 1,000 mg by mouth 3 (three) times daily. Take 1 tablet once daily  Modified Medications   Modified Medication Previous Medication   ALPRAZOLAM (XANAX) 0.25 MG TABLET ALPRAZolam (XANAX) 0.25 MG  tablet      One up to 3 times daily for anxiety    One up to 3 times daily for anxiety   HYDROCODONE-ACETAMINOPHEN (NORCO/VICODIN) 5-325 MG PER TABLET HYDROcodone-acetaminophen (NORCO/VICODIN) 5-325 MG per tablet      One every 6 hours if needed for pain    One every 6 hours if needed for pain  Discontinued Medications   No medications on file     Review of Systems  Constitutional: Negative.   HENT: Negative.   Eyes: Negative.   Respiratory: Negative.   Cardiovascular: Negative.   Endocrine: Negative for heat intolerance, polydipsia, polyphagia and polyuria.       Diabetic  Genitourinary: Negative.   Musculoskeletal: Negative.   Allergic/Immunologic: Negative.   Neurological: Negative.   Hematological: Negative.   Psychiatric/Behavioral: Negative.     Filed Vitals:   01/30/15 1342  BP: 140/90  Pulse: 105  Temp: 99.1 F (37.3 C)  TempSrc: Oral  Resp: 18  Height: 5\' 5"  (1.651 m)  Weight: 112 lb 6.4 oz (50.984 kg)  SpO2: 98%   Body mass index is 18.7 kg/(m^2).  Physical Exam  Constitutional: She is oriented to person, place, and time. She appears well-developed and well-nourished. No distress.  HENT:  Head: Normocephalic and atraumatic.  Right Ear: External ear normal.  Left Ear: External ear normal.  Nose: Nose normal.  Mouth/Throat: No oropharyngeal exudate.  Eyes: Conjunctivae and EOM are normal. Pupils are  equal, round, and reactive to light. Right eye exhibits no discharge. Left eye exhibits no discharge.  Neck: Neck supple. No JVD present. No tracheal deviation present. No thyromegaly present.  Cardiovascular: Normal rate, regular rhythm, normal heart sounds and intact distal pulses.  Exam reveals no friction rub.   No murmur heard. Pulmonary/Chest: Breath sounds normal. No respiratory distress. She has no wheezes. She exhibits no tenderness.  Abdominal: Bowel sounds are normal. She exhibits no distension and no mass. There is no tenderness.  Musculoskeletal:  Normal range of motion. She exhibits no edema or tenderness.  Lymphadenopathy:    She has no cervical adenopathy.  Neurological: She is alert and oriented to person, place, and time. She has normal reflexes. No cranial nerve deficit. Coordination normal.  Skin: No rash noted. No erythema. No pallor.  Scarred nodular feeling area of the right 4th finger proximal phalanx dorsum. Large black seborrheic keratosis of the left upper breast.  Psychiatric: She has a normal mood and affect. Her behavior is normal. Judgment and thought content normal.     Labs reviewed: No visits with results within 3 Month(s) from this visit. Latest known visit with results is:  Office Visit on 07/04/2014  Component Date Value Ref Range Status  . Hgb A1c MFr Bld 07/04/2014 10.4* 4.8 - 5.6 % Final   Comment:          Pre-diabetes: 5.7 - 6.4          Diabetes: >6.4          Glycemic control for adults with diabetes: <7.0   . Est. average glucose Bld gHb Est-m* 07/04/2014 252   Final     Assessment/Plan  1. Diabetes mellitus without complication If S2L is high, should resume metformin - Hemoglobin A1c; Future - Basic metabolic panel; Future - Hemoglobin T5V - Basic metabolic panel  2. Essential hypertension - Basic metabolic panel; Future - Basic metabolic panel  3. Generalized anxiety disorder - ALPRAZolam (XANAX) 0.25 MG tablet; One up to 3 times daily for anxiety  Dispense: 100 tablet; Refill: 0  4. Endometriosis - HYDROcodone-acetaminophen (NORCO/VICODIN) 5-325 MG per tablet; One every 6 hours if needed for pain  Dispense: 100 tablet; Refill: 0  5. Seborrheic keratoses Observe. She is not interested in derm referral yet  6. Hyperlipidemia - Lipid panel; Future - Lipid panel

## 2015-01-31 LAB — HEMOGLOBIN A1C
Est. average glucose Bld gHb Est-mCnc: 318 mg/dL
Hgb A1c MFr Bld: 12.7 % — ABNORMAL HIGH (ref 4.8–5.6)

## 2015-01-31 LAB — LIPID PANEL
Chol/HDL Ratio: 5.7 ratio units — ABNORMAL HIGH (ref 0.0–4.4)
Cholesterol, Total: 252 mg/dL — ABNORMAL HIGH (ref 100–199)
HDL: 44 mg/dL (ref >39)
LDL Calculated: 144 mg/dL — ABNORMAL HIGH (ref 0–99)
Triglycerides: 322 mg/dL — ABNORMAL HIGH (ref 0–149)
VLDL Cholesterol Cal: 64 mg/dL — ABNORMAL HIGH (ref 5–40)

## 2015-01-31 LAB — BASIC METABOLIC PANEL
BUN/Creatinine Ratio: 14 (ref 9–23)
BUN: 9 mg/dL (ref 6–24)
CO2: 25 mmol/L (ref 18–29)
Calcium: 9.4 mg/dL (ref 8.7–10.2)
Chloride: 96 mmol/L — ABNORMAL LOW (ref 97–108)
Creatinine, Ser: 0.65 mg/dL (ref 0.57–1.00)
GFR calc Af Amer: 118 mL/min/{1.73_m2} (ref >59)
GFR calc non Af Amer: 102 mL/min/{1.73_m2} (ref >59)
Glucose: 357 mg/dL — ABNORMAL HIGH (ref 65–99)
Potassium: 4.2 mmol/L (ref 3.5–5.2)
Sodium: 136 mmol/L (ref 134–144)

## 2015-04-04 ENCOUNTER — Other Ambulatory Visit: Payer: Self-pay | Admitting: Internal Medicine

## 2015-04-04 DIAGNOSIS — E119 Type 2 diabetes mellitus without complications: Secondary | ICD-10-CM

## 2015-04-04 MED ORDER — METFORMIN HCL 500 MG PO TABS: ORAL_TABLET | ORAL | Status: DC

## 2015-04-17 ENCOUNTER — Encounter: Payer: Self-pay | Admitting: Gastroenterology

## 2015-05-10 ENCOUNTER — Encounter: Payer: Self-pay | Admitting: *Deleted

## 2015-05-10 LAB — HM COLONOSCOPY

## 2015-05-28 ENCOUNTER — Other Ambulatory Visit: Payer: Self-pay | Admitting: Internal Medicine

## 2015-05-28 DIAGNOSIS — K5792 Diverticulitis of intestine, part unspecified, without perforation or abscess without bleeding: Secondary | ICD-10-CM | POA: Insufficient documentation

## 2015-05-28 DIAGNOSIS — K5732 Diverticulitis of large intestine without perforation or abscess without bleeding: Secondary | ICD-10-CM

## 2015-05-28 LAB — HM MAMMOGRAPHY: HM Mammogram: NEGATIVE

## 2015-05-28 MED ORDER — CIPROFLOXACIN HCL 500 MG PO TABS: 500.0000 mg | ORAL_TABLET | Freq: Two times a day (BID) | ORAL | Status: DC

## 2015-05-28 MED ORDER — METRONIDAZOLE 500 MG PO TABS: 500.0000 mg | ORAL_TABLET | Freq: Three times a day (TID) | ORAL | Status: DC

## 2015-05-29 ENCOUNTER — Telehealth: Payer: Self-pay | Admitting: *Deleted

## 2015-05-29 ENCOUNTER — Encounter: Payer: Self-pay | Admitting: *Deleted

## 2015-05-29 NOTE — Telephone Encounter (Signed)
Mammogram was abstracted .

## 2015-06-18 ENCOUNTER — Encounter: Payer: Self-pay | Admitting: Gastroenterology

## 2015-07-30 ENCOUNTER — Other Ambulatory Visit: Payer: Self-pay

## 2015-07-30 DIAGNOSIS — I10 Essential (primary) hypertension: Secondary | ICD-10-CM

## 2015-07-30 DIAGNOSIS — E119 Type 2 diabetes mellitus without complications: Secondary | ICD-10-CM

## 2015-07-30 DIAGNOSIS — E785 Hyperlipidemia, unspecified: Secondary | ICD-10-CM

## 2015-08-02 ENCOUNTER — Other Ambulatory Visit: Payer: BLUE CROSS/BLUE SHIELD

## 2015-08-02 DIAGNOSIS — I10 Essential (primary) hypertension: Secondary | ICD-10-CM

## 2015-08-02 DIAGNOSIS — E119 Type 2 diabetes mellitus without complications: Secondary | ICD-10-CM

## 2015-08-02 DIAGNOSIS — E785 Hyperlipidemia, unspecified: Secondary | ICD-10-CM

## 2015-08-03 LAB — BASIC METABOLIC PANEL
BUN/Creatinine Ratio: 16 (ref 9–23)
BUN: 10 mg/dL (ref 6–24)
CO2: 25 mmol/L (ref 18–29)
Calcium: 9.8 mg/dL (ref 8.7–10.2)
Chloride: 100 mmol/L (ref 96–106)
Creatinine, Ser: 0.63 mg/dL (ref 0.57–1.00)
GFR calc Af Amer: 119 mL/min/{1.73_m2} (ref >59)
GFR calc non Af Amer: 103 mL/min/{1.73_m2} (ref >59)
Glucose: 237 mg/dL — ABNORMAL HIGH (ref 65–99)
Potassium: 4.2 mmol/L (ref 3.5–5.2)
Sodium: 140 mmol/L (ref 134–144)

## 2015-08-03 LAB — LIPID PANEL
Chol/HDL Ratio: 4.3 ratio units (ref 0.0–4.4)
Cholesterol, Total: 213 mg/dL — ABNORMAL HIGH (ref 100–199)
HDL: 50 mg/dL (ref >39)
LDL Calculated: 140 mg/dL — ABNORMAL HIGH (ref 0–99)
Triglycerides: 116 mg/dL (ref 0–149)
VLDL Cholesterol Cal: 23 mg/dL (ref 5–40)

## 2015-08-03 LAB — HEMOGLOBIN A1C
Est. average glucose Bld gHb Est-mCnc: 289 mg/dL
Hgb A1c MFr Bld: 11.7 % — ABNORMAL HIGH (ref 4.8–5.6)

## 2015-08-06 ENCOUNTER — Ambulatory Visit: Payer: BLUE CROSS/BLUE SHIELD | Admitting: Internal Medicine

## 2015-08-07 ENCOUNTER — Encounter: Payer: Self-pay | Admitting: Internal Medicine

## 2015-08-07 ENCOUNTER — Ambulatory Visit (INDEPENDENT_AMBULATORY_CARE_PROVIDER_SITE_OTHER): Payer: BLUE CROSS/BLUE SHIELD | Admitting: Internal Medicine

## 2015-08-07 VITALS — BP 144/82 | HR 82 | Temp 98.1°F | Ht 65.0 in | Wt 117.6 lb

## 2015-08-07 DIAGNOSIS — Z860100 Personal history of colon polyps, unspecified: Secondary | ICD-10-CM

## 2015-08-07 DIAGNOSIS — E785 Hyperlipidemia, unspecified: Secondary | ICD-10-CM

## 2015-08-07 DIAGNOSIS — I10 Essential (primary) hypertension: Secondary | ICD-10-CM

## 2015-08-07 DIAGNOSIS — N809 Endometriosis, unspecified: Secondary | ICD-10-CM

## 2015-08-07 DIAGNOSIS — F411 Generalized anxiety disorder: Secondary | ICD-10-CM

## 2015-08-07 DIAGNOSIS — E119 Type 2 diabetes mellitus without complications: Secondary | ICD-10-CM

## 2015-08-07 HISTORY — DX: Personal history of colon polyps, unspecified: Z86.0100

## 2015-08-07 MED ORDER — ALPRAZOLAM 0.25 MG PO TABS
ORAL_TABLET | ORAL | Status: DC
Start: 2015-08-07 — End: 2015-12-17

## 2015-08-07 MED ORDER — HYDROCODONE-ACETAMINOPHEN 5-325 MG PO TABS
ORAL_TABLET | ORAL | Status: DC
Start: 2015-08-07 — End: 2015-12-17

## 2015-08-07 MED ORDER — CANAGLIFLOZIN 300 MG PO TABS
ORAL_TABLET | ORAL | Status: DC
Start: 2015-08-07 — End: 2015-12-17

## 2015-08-07 NOTE — Patient Instructions (Addendum)
Stop metformin. Start Invokana 100 mg and advance to 300 mg daily after 10 days.

## 2015-08-07 NOTE — Progress Notes (Signed)
Patient ID: Brooke [name], female   DOB: 27-May-1961, 54 y.o.   MRN: [id number]    Facility  [facility]    Place of Service:   OFFICE    Allergies  Allergen Reactions  . Aspirin     Intolerance (aspirin sensitive stomach)  . Darvocet [Propoxyphene N-Acetaminophen]   . Erythromycin   . Ibuprofen   . Ivp Dye [Iodinated Diagnostic Agents]     Chief Complaint  Patient presents with  . Medical Management of Chronic Issues    Medical Management of Chronic Issues. 6 month follow up    HPI:  Not feeling well  Since colon polyp removed on 05/10/15. Feels tired. May have had diverticuitis. Having loose stools x 4 today. No blood in the stools.  Endometriosis seems better, but she still has lower abd pain.  Nerves are shot.  Metformin seemed associated with diarrhea. Had 4-5 loose stools daily. She stopped the metformin.  Tingling in the leftt 4th finger.  Medications: Patient's Medications  New Prescriptions   No medications on file  Previous Medications   CHROMIUM-CINNAMON (CINNAMON PLUS CHROMIUM PO)    Take 1,000 mg by mouth 2 (two) times daily with a meal.   FOLIC ACID (FOLVITE) 993 MCG TABLET    Take 400 mcg by mouth daily.   MAGNESIUM 30 MG TABLET    Take two tablets by mouth once daily   METFORMIN (GLUCOPHAGE) 500 MG TABLET    1 each morning to control diabetes   MILK THISTLE EXTRACT PO    Take by mouth.   MISC NATURAL PRODUCTS (ECHINACEA GOLDENSEAL PLUS PO)    Take by mouth. Take 2 capsules once daily.   MULTIPLE VITAMINS-MINERALS (ADVANCED DIABETIC MULTIVITAMIN PO)    Take by mouth. Take 1 packet once daily.   NUTRITIONAL SUPPLEMENTS (NUTRITIONAL SUPPLEMENT PO)    Take by mouth. Glucocil= 2 tablets BID   RANITIDINE (ZANTAC) 75 MG TABLET    Take one tablet by mouth once daily as needed   VITAMIN C (ASCORBIC ACID) 500 MG TABLET    Take 1,000 mg by mouth 3 (three) times daily. Take 1 tablet once daily  Modified Medications   Modified Medication Previous Medication   ALPRAZOLAM  (XANAX) 0.25 MG TABLET ALPRAZolam (XANAX) 0.25 MG tablet      One up to 3 times daily for anxiety    One up to 3 times daily for anxiety   HYDROCODONE-ACETAMINOPHEN (NORCO/VICODIN) 5-325 MG TABLET HYDROcodone-acetaminophen (NORCO/VICODIN) 5-325 MG per tablet      One every 6 hours if needed for pain    One every 6 hours if needed for pain  Discontinued Medications   CIPROFLOXACIN (CIPRO) 500 MG TABLET    Take 1 tablet (500 mg total) by mouth 2 (two) times daily.   METRONIDAZOLE (FLAGYL) 500 MG TABLET    Take 1 tablet (500 mg total) by mouth 3 (three) times daily.    Review of Systems  Constitutional: Negative.   HENT: Negative.   Eyes: Negative.   Respiratory: Negative.   Cardiovascular: Negative.   Endocrine: Negative for heat intolerance, polydipsia, polyphagia and polyuria.       Diabetic  Genitourinary: Negative.   Musculoskeletal: Negative.   Allergic/Immunologic: Negative.   Neurological: Negative.   Hematological: Negative.   Psychiatric/Behavioral: Negative.     Filed Vitals:   08/07/15 1543  BP: 144/82  Pulse: 82  Temp: 98.1 F (36.7 C)  TempSrc: Oral  Height: _0  (1.651 m)  Weight: 117 lb 9.6 oz (  53.343 kg)   Body mass index is 19.57 kg/(m^2). Filed Weights   08/07/15 1543  Weight: 117 lb 9.6 oz (53.343 kg)     Physical Exam  Constitutional: She is oriented to person, place, and time. She appears well-developed and well-nourished. No distress.  HENT:  Head: Normocephalic and atraumatic.  Right Ear: External ear normal.  Left Ear: External ear normal.  Nose: Nose normal.  Mouth/Throat: No oropharyngeal exudate.  Eyes: Conjunctivae and EOM are normal. Pupils are equal, round, and reactive to light. Right eye exhibits no discharge. Left eye exhibits no discharge.  Neck: Neck supple. No JVD present. No tracheal deviation present. No thyromegaly present.  Cardiovascular: Normal rate, regular rhythm, normal heart sounds and intact distal pulses.  Exam reveals  no friction rub.   No murmur heard. Pulmonary/Chest: Breath sounds normal. No respiratory distress. She has no wheezes. She exhibits no tenderness.  Abdominal: Bowel sounds are normal. She exhibits no distension and no mass. There is no tenderness.  Musculoskeletal: Normal range of motion. She exhibits no edema or tenderness.  Lymphadenopathy:    She has no cervical adenopathy.  Neurological: She is alert and oriented to person, place, and time. She has normal reflexes. No cranial nerve deficit. Coordination normal.  Skin: No rash noted. No erythema. No pallor.  Scarred nodular feeling area of the right 4th finger proximal phalanx dorsum. Large black seborrheic keratosis of the left upper breast.  Psychiatric: She has a normal mood and affect. Her behavior is normal. Judgment and thought content normal.    Labs reviewed: Lab Summary Latest Ref Rng 08/02/2015 01/30/2015 03/28/2013 11/08/2011  Hemoglobin 12.0 - 15.0 g/dL (None) (None) (None) 13.9  Hematocrit 36.0 - 46.0 % (None) (None) (None) 40.7  White count 4.0 - 10.5 K/uL (None) (None) (None) 11.0(H)  Platelet count 150 - 400 K/uL (None) (None) (None) 254  Sodium 134 - 144 mmol/L 140 136 139 133(L)  Potassium 3.5 - 5.2 mmol/L 4.2 4.2 3.7 3.8  Calcium 8.7 - 10.2 mg/dL 9.8 9.4 9.2 9.4  Phosphorus - (None) (None) (None) (None)  Creatinine 0.57 - 1.00 mg/dL 0.63 0.65 0.59 0.56  AST 0 - 40 IU/L (None) (None) 14 12  Alk Phos 39 - 117 IU/L (None) (None) 89 84  Bilirubin 0.0 - 1.2 mg/dL (None) (None) 0.2 0.3  Glucose 65 - 99 mg/dL 237(H) 357(H) 252(H) 380(H)  Cholesterol - (None) (None) (None) (None)  HDL cholesterol >39 mg/dL 50 44 39(L) (None)  Triglycerides 0 - 149 mg/dL 116 322(H) 331(H) (None)  LDL Direct - (None) (None) (None) (None)  LDL Calc 0 - 99 mg/dL 140(H) 144(H) 102(H) (None)  Total protein 6.0 - 8.3 g/dL (None) (None) (None) 8.2  Albumin 3.5 - 5.5 g/dL (None) (None) 4.5 4.0   No results found for: TSH, T3TOTAL, T4TOTAL,  THYROIDAB Lab Results  Component Value Date   BUN 10 08/02/2015   BUN 9 01/30/2015   BUN 9 03/28/2013   Lab Results  Component Value Date   HGBA1C 11.7* 08/02/2015   HGBA1C 12.7* 01/30/2015   HGBA1C 10.4* 07/04/2014    Assessment/Plan  1. Generalized anxiety disorder - ALPRAZolam (XANAX) 0.25 MG tablet; One up to 3 times daily for anxiety  Dispense: 100 tablet; Refill: 0  2. Endometriosis - HYDROcodone-acetaminophen (NORCO/VICODIN) 5-325 MG tablet; One every 6 hours if needed for pain  Dispense: 100 tablet; Refill: 0  3. Diabetes mellitus without complication (HCC) Poor control. Not using metformin. Discontinue metformin due to loose stools. Start  Invokana 300 mg daily  4. Hyperlipidemia Continues with modestly high LDL. I think her blood sugar and is brought under control before we concentrated on this.  5. Essential hypertension Controlled  6. History of colonic polyps Continues to be followed by Dr. Collene Mares

## 2015-10-03 ENCOUNTER — Encounter: Payer: Self-pay | Admitting: Internal Medicine

## 2015-11-01 ENCOUNTER — Other Ambulatory Visit: Payer: BLUE CROSS/BLUE SHIELD

## 2015-11-05 ENCOUNTER — Ambulatory Visit: Payer: BLUE CROSS/BLUE SHIELD | Admitting: Internal Medicine

## 2015-11-29 ENCOUNTER — Other Ambulatory Visit: Payer: BLUE CROSS/BLUE SHIELD

## 2015-12-03 ENCOUNTER — Ambulatory Visit: Payer: BLUE CROSS/BLUE SHIELD | Admitting: Internal Medicine

## 2015-12-13 ENCOUNTER — Other Ambulatory Visit: Payer: BLUE CROSS/BLUE SHIELD

## 2015-12-13 DIAGNOSIS — E119 Type 2 diabetes mellitus without complications: Secondary | ICD-10-CM

## 2015-12-13 DIAGNOSIS — I10 Essential (primary) hypertension: Secondary | ICD-10-CM

## 2015-12-13 DIAGNOSIS — E785 Hyperlipidemia, unspecified: Secondary | ICD-10-CM

## 2015-12-14 LAB — LIPID PANEL
Chol/HDL Ratio: 4.7 ratio units — ABNORMAL HIGH (ref 0.0–4.4)
Cholesterol, Total: 192 mg/dL (ref 100–199)
HDL: 41 mg/dL (ref >39)
LDL Calculated: 118 mg/dL — ABNORMAL HIGH (ref 0–99)
Triglycerides: 166 mg/dL — ABNORMAL HIGH (ref 0–149)
VLDL Cholesterol Cal: 33 mg/dL (ref 5–40)

## 2015-12-14 LAB — MICROALBUMIN, URINE: Microalbumin, Urine: 28.5 ug/mL

## 2015-12-14 LAB — BASIC METABOLIC PANEL
BUN/Creatinine Ratio: 9 (ref 9–23)
BUN: 7 mg/dL (ref 6–24)
CO2: 26 mmol/L (ref 18–29)
Calcium: 9.3 mg/dL (ref 8.7–10.2)
Chloride: 96 mmol/L (ref 96–106)
Creatinine, Ser: 0.77 mg/dL (ref 0.57–1.00)
GFR calc Af Amer: 102 mL/min/{1.73_m2} (ref >59)
GFR calc non Af Amer: 88 mL/min/{1.73_m2} (ref >59)
Glucose: 229 mg/dL — ABNORMAL HIGH (ref 65–99)
Potassium: 4.4 mmol/L (ref 3.5–5.2)
Sodium: 136 mmol/L (ref 134–144)

## 2015-12-14 LAB — HEMOGLOBIN A1C
Est. average glucose Bld gHb Est-mCnc: 329 mg/dL
Hgb A1c MFr Bld: 13.1 % — ABNORMAL HIGH (ref 4.8–5.6)

## 2015-12-17 ENCOUNTER — Encounter: Payer: Self-pay | Admitting: Internal Medicine

## 2015-12-17 ENCOUNTER — Ambulatory Visit (INDEPENDENT_AMBULATORY_CARE_PROVIDER_SITE_OTHER): Payer: BLUE CROSS/BLUE SHIELD | Admitting: Internal Medicine

## 2015-12-17 VITALS — BP 152/100 | HR 68 | Temp 98.5°F | Ht 65.0 in | Wt 115.0 lb

## 2015-12-17 DIAGNOSIS — I10 Essential (primary) hypertension: Secondary | ICD-10-CM | POA: Diagnosis not present

## 2015-12-17 DIAGNOSIS — N809 Endometriosis, unspecified: Secondary | ICD-10-CM | POA: Diagnosis not present

## 2015-12-17 DIAGNOSIS — E785 Hyperlipidemia, unspecified: Secondary | ICD-10-CM

## 2015-12-17 DIAGNOSIS — R1032 Left lower quadrant pain: Secondary | ICD-10-CM | POA: Insufficient documentation

## 2015-12-17 DIAGNOSIS — E119 Type 2 diabetes mellitus without complications: Secondary | ICD-10-CM | POA: Diagnosis not present

## 2015-12-17 DIAGNOSIS — F411 Generalized anxiety disorder: Secondary | ICD-10-CM

## 2015-12-17 HISTORY — DX: Left lower quadrant pain: R10.32

## 2015-12-17 MED ORDER — GLYBURIDE 5 MG PO TABS: ORAL_TABLET | ORAL | Status: DC

## 2015-12-17 MED ORDER — ALPRAZOLAM 0.5 MG PO TBDP: 0.5000 mg | ORAL_TABLET | Freq: Two times a day (BID) | ORAL | Status: DC | PRN

## 2015-12-17 MED ORDER — METFORMIN HCL 500 MG PO TABS: ORAL_TABLET | ORAL | Status: DC

## 2015-12-17 MED ORDER — HYDROCODONE-ACETAMINOPHEN 5-325 MG PO TABS: ORAL_TABLET | ORAL | Status: DC

## 2015-12-17 MED ORDER — ALPRAZOLAM 0.25 MG PO TABS: ORAL_TABLET | ORAL | Status: DC

## 2015-12-17 NOTE — Addendum Note (Signed)
Addended by: Estill Dooms on: 12/17/2015 04:19 PM   Modules accepted: Orders

## 2015-12-17 NOTE — Progress Notes (Addendum)
Patient ID: Brooke [name], female   DOB: 1960/11/29, 55 y.o.   MRN: [id number]    Facility  [facility]    Place of Service:   OFFICE    Allergies  Allergen Reactions  . Aspirin     Intolerance (aspirin sensitive stomach)  . Darvocet [Propoxyphene N-Acetaminophen]   . Erythromycin   . Ibuprofen   . Ivp Dye [Iodinated Diagnostic Agents]     Chief Complaint  Patient presents with  . Medical Management of Chronic Issues    medication management blood sugar, blood pressure, cholesterol, anxiety    HPI:  Generalized anxiety disorder - concerned about proposal for her daughter to have surgery for scoliosis  Endometriosis - benefits from HYDROcodone-acetaminophen (NORCO/VICODIN) 5-325 MG tablet  Diabetes mellitus without complication ([place]) - out of control  Hyperlipidemia - controlled  Essential hypertension - poor control  Has had diffuse abd pains ever since she had the colonoscopy in Sept 2016.   Medications: Patient's Medications  New Prescriptions   No medications on file  Previous Medications   CHROMIUM-CINNAMON (CINNAMON PLUS CHROMIUM PO)    Take 1,000 mg by mouth 2 (two) times daily with a meal.   FOLIC ACID (FOLVITE) 027 MCG TABLET    Take 400 mcg by mouth daily.   MAGNESIUM 30 MG TABLET    Take two tablets by mouth once daily   METFORMIN (GLUCOPHAGE) 500 MG TABLET    1 each morning to control diabetes   MILK THISTLE EXTRACT PO    Take by mouth.   MISC NATURAL PRODUCTS (ECHINACEA GOLDENSEAL PLUS PO)    Take by mouth. Take 2 capsules once daily.   MULTIPLE VITAMINS-MINERALS (ADVANCED DIABETIC MULTIVITAMIN PO)    Take by mouth. Take 1 packet once daily.   NUTRITIONAL SUPPLEMENTS (NUTRITIONAL SUPPLEMENT PO)    Take by mouth. Glucocil= 2 tablets BID   OVER THE COUNTER MEDICATION    Yacon Root take 1 teaspoon as needed to lower blood sugar   RANITIDINE (ZANTAC) 75 MG TABLET    Take one tablet by mouth once daily as needed   RED YEAST RICE EXTRACT (RED YEAST RICE PO)    Take  by mouth. Take two tablespoons in evening for cholesterol   VITAMIN C (ASCORBIC ACID) 500 MG TABLET    Take 1,000 mg by mouth 3 (three) times daily. Take 1 tablet once daily  Modified Medications   Modified Medication Previous Medication   ALPRAZOLAM (XANAX) 0.25 MG TABLET ALPRAZolam (XANAX) 0.25 MG tablet      One up to 3 times daily for anxiety    One up to 3 times daily for anxiety   HYDROCODONE-ACETAMINOPHEN (NORCO/VICODIN) 5-325 MG TABLET HYDROcodone-acetaminophen (NORCO/VICODIN) 5-325 MG tablet      One every 6 hours if needed for pain    One every 6 hours if needed for pain  Discontinued Medications   CANAGLIFLOZIN (INVOKANA) 300 MG TABS TABLET    One each morning to control diabetes    Review of Systems  Constitutional: Negative.   HENT: Negative.   Eyes: Negative.   Respiratory: Negative.   Cardiovascular: Negative.   Endocrine: Negative for heat intolerance, polydipsia, polyphagia and polyuria.       Diabetic  Genitourinary: Negative.   Musculoskeletal: Negative.   Allergic/Immunologic: Negative.   Neurological: Negative.   Hematological: Negative.   Psychiatric/Behavioral: Negative.     Filed Vitals:   12/17/15 1447  BP: 152/100  Pulse: 68  Temp: 98.5 F (36.9 C)  TempSrc: Oral  Height: 5' 5"  (1.651 m)  Weight: 115 lb (52.164 kg)  SpO2: 96%   Body mass index is 19.14 kg/(m^2). Filed Weights   12/17/15 1447  Weight: 115 lb (52.164 kg)     Physical Exam  Constitutional: She is oriented to person, place, and time. She appears well-developed and well-nourished. No distress.  HENT:  Head: Normocephalic and atraumatic.  Right Ear: External ear normal.  Left Ear: External ear normal.  Nose: Nose normal.  Mouth/Throat: No oropharyngeal exudate.  Eyes: Conjunctivae and EOM are normal. Pupils are equal, round, and reactive to light. Right eye exhibits no discharge. Left eye exhibits no discharge.  Neck: Neck supple. No JVD present. No tracheal deviation  present. No thyromegaly present.  Cardiovascular: Normal rate, regular rhythm, normal heart sounds and intact distal pulses.  Exam reveals no friction rub.   No murmur heard. Pulmonary/Chest: Breath sounds normal. No respiratory distress. She has no wheezes. She exhibits no tenderness.  Abdominal: Bowel sounds are normal. She exhibits no distension and no mass. There is no tenderness.  Musculoskeletal: Normal range of motion. She exhibits no edema or tenderness.  Lymphadenopathy:    She has no cervical adenopathy.  Neurological: She is alert and oriented to person, place, and time. She has normal reflexes. No cranial nerve deficit. Coordination normal.  Skin: No rash noted. No erythema. No pallor.  Scarred nodular feeling area of the right 4th finger proximal phalanx dorsum. Large black seborrheic keratosis of the left upper breast.  Psychiatric: She has a normal mood and affect. Her behavior is normal. Judgment and thought content normal.    Labs reviewed: Lab Summary Latest Ref Rng 12/13/2015 08/02/2015 01/30/2015 03/28/2013  Hemoglobin 13.0-17.0 g/dL (None) (None) (None) (None)  Hematocrit 39.0-52.0 % (None) (None) (None) (None)  White count - (None) (None) (None) (None)  Platelet count - (None) (None) (None) (None)  Sodium 134 - 144 mmol/L 136 140 136 139  Potassium 3.5 - 5.2 mmol/L 4.4 4.2 4.2 3.7  Calcium 8.7 - 10.2 mg/dL 9.3 9.8 9.4 9.2  Phosphorus - (None) (None) (None) (None)  Creatinine 0.57 - 1.00 mg/dL 0.77 0.63 0.65 0.59  AST 0 - 40 IU/L (None) (None) (None) 14  Alk Phos 39 - 117 IU/L (None) (None) (None) 89  Bilirubin 0.0 - 1.2 mg/dL (None) (None) (None) 0.2  Glucose 65 - 99 mg/dL 229(H) 237(H) 357(H) 252(H)  Cholesterol - (None) (None) (None) (None)  HDL cholesterol >39 mg/dL 41 50 44 39(L)  Triglycerides 0 - 149 mg/dL 166(H) 116 322(H) 331(H)  LDL Direct - (None) (None) (None) (None)  LDL Calc 0 - 99 mg/dL 118(H) 140(H) 144(H) 102(H)  Total protein - (None) (None)  (None) (None)  Albumin 3.5 - 5.5 g/dL (None) (None) (None) 4.5   No results found for: TSH, T3TOTAL, T4TOTAL, THYROIDAB Lab Results  Component Value Date   BUN 7 12/13/2015   BUN 10 08/02/2015   BUN 9 01/30/2015   Lab Results  Component Value Date   HGBA1C 13.1* 12/13/2015   HGBA1C 11.7* 08/02/2015   HGBA1C 12.7* 01/30/2015    Assessment/Plan  1. Generalized anxiety disorder - ALPRAZolam (NIRAVAM) 0.5 MG dissolvable tablet; Take 1 tablet (0.5 mg total) by mouth 2 (two) times daily as needed for anxiety.  Dispense: 60 tablet; Refill: 3  2. Endometriosis - HYDROcodone-acetaminophen (NORCO/VICODIN) 5-325 MG tablet; One every 6 hours if needed for pain  Dispense: 100 tablet; Refill: 0  3. Diabetes mellitus without complication (HCC) - metFORMIN (GLUCOPHAGE) 500 MG tablet;  1 before breakfast and 1 before dinner to control diabetes  Dispense: 180 tablet; Refill: 3 - glyBURIDE (DIABETA) 5 MG tablet; One before breakfast and 1 before dinner to control diabetes  Dispense: 180 tablet; Refill: 4  4. Hyperlipidemia Continue red yeast rice  5. Essential hypertension She will monitor at home  6. Ab pain in LLQ and suprapubic area -CT of abd

## 2015-12-23 ENCOUNTER — Telehealth: Payer: Self-pay | Admitting: Internal Medicine

## 2015-12-23 NOTE — Telephone Encounter (Signed)
Cancel the CT of the abdomen with contrast. Schedule MRI of the abdomen without contrast.

## 2015-12-23 NOTE — Telephone Encounter (Signed)
Arena from [facility] called, and patient reports she gets a rapid heartbeat with contrast and it is noted in the patients chart as being allergic.  Please advise, Thank you      (If test is to be done with contrast arrangements will have to be made to have test done in the hospital due to allergy)

## 2015-12-24 NOTE — Addendum Note (Signed)
Addended by: Rafael Bihari A on: 12/24/2015 09:31 AM   Modules accepted: Orders

## 2015-12-24 NOTE — Telephone Encounter (Signed)
Order placed for MRI abdomen without contrast.

## 2015-12-25 ENCOUNTER — Other Ambulatory Visit: Payer: Self-pay | Admitting: Internal Medicine

## 2015-12-25 DIAGNOSIS — R1032 Left lower quadrant pain: Secondary | ICD-10-CM

## 2015-12-27 ENCOUNTER — Telehealth: Payer: Self-pay | Admitting: Internal Medicine

## 2015-12-27 NOTE — Telephone Encounter (Signed)
[employer] has declined the order for [employer] as the ordering Physician have the option of calling the Physician Courtesy Review line if you wish to have this decision appealed.  [phone]  Option 1 Then option 7   Contract # [id number] Brooke [name] Dob: August 02, 1961 Member# [id number]

## 2016-01-01 ENCOUNTER — Inpatient Hospital Stay: Admission: RE | Admit: 2016-01-01 | Payer: Self-pay | Source: Ambulatory Visit

## 2016-01-01 NOTE — Telephone Encounter (Signed)
Patients appointment was scheduled for this afternoon and [facility] called to ask if they need to reschedule patient.   Please Advise,  Thank you

## 2016-01-03 ENCOUNTER — Other Ambulatory Visit: Payer: Self-pay

## 2016-01-15 ENCOUNTER — Other Ambulatory Visit: Payer: Self-pay | Admitting: Internal Medicine

## 2016-01-15 DIAGNOSIS — E119 Type 2 diabetes mellitus without complications: Secondary | ICD-10-CM

## 2016-01-15 MED ORDER — GLYBURIDE 5 MG PO TABS: ORAL_TABLET | ORAL | Status: DC

## 2016-01-15 MED ORDER — METFORMIN HCL 500 MG PO TABS: ORAL_TABLET | ORAL | Status: DC

## 2016-01-15 NOTE — Telephone Encounter (Signed)
The prior authorization inquired about below has now expired, how would you like to proceed with the patient. Please advise, thank you

## 2016-01-22 NOTE — Telephone Encounter (Signed)
CT of the abd with and without contrast is scheduled for 01/27/16. I assume insurance has approved this test. Let me know if there is a problem.

## 2016-01-23 NOTE — Telephone Encounter (Signed)
That was an old message I sent to you from the last tests that was ordered. We are still working on the prior Auth for the test scheduled for 01/27/16. I will keep you updated on that thanks.

## 2016-01-24 ENCOUNTER — Telehealth: Payer: Self-pay | Admitting: Internal Medicine

## 2016-01-24 NOTE — Telephone Encounter (Signed)
[employer] has denied the request for CT Abdomen & Pelvis   As the Order physician you have the option to call and speak with a physician reviewer  [phone]   Brooke [name] Member ID: [id number] Contract #: [id number]

## 2016-01-27 ENCOUNTER — Inpatient Hospital Stay: Admission: RE | Admit: 2016-01-27 | Payer: Self-pay | Source: Ambulatory Visit

## 2016-01-30 ENCOUNTER — Other Ambulatory Visit: Payer: Self-pay

## 2016-03-06 ENCOUNTER — Telehealth: Payer: Self-pay | Admitting: *Deleted

## 2016-03-06 DIAGNOSIS — R1032 Left lower quadrant pain: Secondary | ICD-10-CM

## 2016-03-06 NOTE — Telephone Encounter (Signed)
Please refer to gastroenterologist, Dr. Ardis Hughs, as requested.

## 2016-03-06 NOTE — Telephone Encounter (Signed)
Order placed and patient notified 

## 2016-03-06 NOTE — Telephone Encounter (Signed)
Patient called and stated that since she cannot get a referral for MRI she wants a referral to GI-Dr. Eugenia Pancoast NP for her stomach. Please Advise.

## 2016-03-10 ENCOUNTER — Telehealth: Payer: Self-pay | Admitting: Gastroenterology

## 2016-03-10 NOTE — Telephone Encounter (Signed)
The referral was sent from [facility] sent to Dr.Jacobs. Previous Gi records are in [place]. Dr.Jacobs will you accept to see patient?

## 2016-03-11 ENCOUNTER — Encounter: Payer: Self-pay | Admitting: Gastroenterology

## 2016-03-11 NOTE — Telephone Encounter (Signed)
Dr Jacobs please advise  

## 2016-03-11 NOTE — Telephone Encounter (Signed)
Patient has been scheduled for earliest available with Dr. Ardis Hughs 05/20/16 and patient has been notified.

## 2016-03-11 NOTE — Telephone Encounter (Signed)
i'm happy to see her.  Schedule for my next available NGI appt. Do not double book and do not schedule her with extender.    Thanks

## 2016-03-11 NOTE — Telephone Encounter (Signed)
Brooke [name] please see Dr Ardis Hughs note, can you please get her scheduled.

## 2016-03-18 ENCOUNTER — Other Ambulatory Visit: Payer: Self-pay | Admitting: Internal Medicine

## 2016-03-18 DIAGNOSIS — N809 Endometriosis, unspecified: Secondary | ICD-10-CM

## 2016-03-18 MED ORDER — HYDROCODONE-ACETAMINOPHEN 5-325 MG PO TABS: ORAL_TABLET | ORAL | 0 refills | Status: DC

## 2016-03-18 NOTE — Telephone Encounter (Signed)
I discussed her situation with the patient today. She is content to see the gastroenterologist and not try to appeal this yet.

## 2016-03-23 ENCOUNTER — Other Ambulatory Visit: Payer: BLUE CROSS/BLUE SHIELD

## 2016-03-25 ENCOUNTER — Ambulatory Visit: Payer: BLUE CROSS/BLUE SHIELD | Admitting: Internal Medicine

## 2016-04-10 ENCOUNTER — Other Ambulatory Visit: Payer: Self-pay

## 2016-04-10 DIAGNOSIS — E119 Type 2 diabetes mellitus without complications: Secondary | ICD-10-CM

## 2016-05-20 ENCOUNTER — Encounter (INDEPENDENT_AMBULATORY_CARE_PROVIDER_SITE_OTHER): Payer: Self-pay

## 2016-05-20 ENCOUNTER — Ambulatory Visit (INDEPENDENT_AMBULATORY_CARE_PROVIDER_SITE_OTHER): Payer: BLUE CROSS/BLUE SHIELD | Admitting: Gastroenterology

## 2016-05-20 ENCOUNTER — Encounter: Payer: Self-pay | Admitting: Gastroenterology

## 2016-05-20 VITALS — BP 166/74 | HR 104 | Ht 63.39 in | Wt 118.2 lb

## 2016-05-20 DIAGNOSIS — R1030 Lower abdominal pain, unspecified: Secondary | ICD-10-CM | POA: Diagnosis not present

## 2016-05-20 DIAGNOSIS — M5442 Lumbago with sciatica, left side: Secondary | ICD-10-CM | POA: Diagnosis not present

## 2016-05-20 NOTE — Patient Instructions (Addendum)
Stop the magnesium (it can contribute to abd pains).  MRI of LS spine for left sided sciatica.    You have been scheduled for an MRI at [facility] on 05/29/16. Your appointment time is 3  pm. Please arrive 15 minutes prior to your appointment time for registration purposes. Please make certain not to have anything to eat or drink 6 hours prior to your test. In addition, if you have any metal in your body, have a pacemaker or defibrillator, please be sure to let your ordering physician know. This test typically takes 45 minutes to 1 hour to complete.  CT scan of pelvis for lower abdominal pains.    You have been scheduled for a CT scan of the pelvis at [facility] ([address]---this is in the same building as Press [facility]).   You are scheduled on 05/27/16 at 1 pm. You should arrive 15 minutes prior to your appointment time for registration. Please follow the written instructions below on the day of your exam:  WARNING: IF YOU ARE ALLERGIC TO IODINE/X-RAY DYE, PLEASE NOTIFY RADIOLOGY IMMEDIATELY AT [phone]! YOU WILL BE GIVEN A 13 HOUR PREMEDICATION PREP.  1) Do not eat or drink anything after 9 am (4 hours prior to your test) 2) You have been given 2 bottles of oral contrast to drink. The solution may taste better if refrigerated, but do NOT add ice or any other liquid to this solution. Shake well before drinking.    Drink 1 bottle of contrast @ 11 am (2 hours prior to your exam)   You may take any medications as prescribed with a small amount of water except for the following: Metformin, Glucophage, Glucovance, Avandamet, Riomet, Fortamet, Actoplus Met, Janumet, Glumetza or Metaglip. The above medications must be held the day of the exam AND 48 hours after the exam.  The purpose of you drinking the oral contrast is to aid in the visualization of your intestinal tract. The contrast solution may cause some diarrhea. Before your exam is started, you will be  given a small amount of fluid to drink. Depending on your individual set of symptoms, you may also receive an intravenous injection of x-ray contrast/dye. Plan on being at [facility] for 30 minutes or longer, depending on the type of exam you are having performed.  This test typically takes 30-45 minutes to complete.  If you have any questions regarding your exam or if you need to reschedule, you may call the CT department at [phone] between the hours of 8:00 am and 5:00 pm, Monday-Friday.  ________________________________________________________________________  Recall colonoscopy Sept 2019 for history of polyps.  Your physician has requested that you go to the basement for the following lab work before leaving today:  BUN and CREAT

## 2016-05-20 NOTE — Progress Notes (Signed)
HPI: This is a   pleasant 55 year old woman   who was referred to me by Estill Dooms, MD  to evaluate  left lower abdominal pains, sciatica, lower abdominal discomforts .    Chief complaint is lower abdominal discomforts, left-sided sciatica  Colonoscopy Dr. Juanita Craver September 2016 for rectal bleeding and colon cancer screening; a single 2 cm pedunculated polyp (path TVA without HGD, margin showed no adenoma) in the colon was removed with hot snare and then the site was injected with epinephrine and hemoclips.  Her terminal ileum was normal. She also diverticulosis. She had prominent internal hemorrhoids.  Dr. Collene Mares recommended recall colonoscopy in 1 year  Says she had pain from polyp site from one year ago, radiates down her left leg.  Since the time of the colonoscopy she has been hurting.  Left sided leg pain, sciatica pain since the time of the polyp resection.  She asked Dr. Collene Mares about it and was not happy.  She has also had lower abdominal pains since the time of the colonoscopy.  These pains are constant.  Moving her bowels has no effect on the pain.    Overall her weight is stable.  She has normal bowels.  Minor red blood on one occasion.  Review of systems: Pertinent positive and negative review of systems were noted in the above HPI section. Complete review of systems was performed and was otherwise normal.   Past Medical History:  Diagnosis Date  . Abdominal pain, left lower quadrant 12/17/2015  . Chest pain, unspecified 10/05/2003  . Depressive disorder, not elsewhere classified 07/31/2005  . Diabetes mellitus without complication ([place]) 123456  . Endometriosis 09/14/2012  . Hypertension 11/23/2003  . Unspecified constipation 06/24/2004    Past Surgical History:  Procedure Laterality Date  . APPENDECTOMY  1996   Dr. Lindon Romp  . BREAST SURGERY Bilateral 1996   Dr. Isaiah Blakes  . INGUINAL HERNIA REPAIR    . OVARIAN CYST REMOVAL      Current Outpatient Prescriptions   Medication Sig Dispense Refill  . ALPRAZolam (NIRAVAM) 0.5 MG dissolvable tablet Take 1 tablet (0.5 mg total) by mouth 2 (two) times daily as needed for anxiety. 60 tablet 3  . Chromium-Cinnamon (CINNAMON PLUS CHROMIUM PO) Take 1,000 mg by mouth 2 (two) times daily with a meal.    . folic acid (FOLVITE) A999333 MCG tablet Take 400 mcg by mouth daily.    Marland Kitchen glyBURIDE (DIABETA) 5 MG tablet One before breakfast and 1 before dinner to control diabetes 180 tablet 4  . HYDROcodone-acetaminophen (NORCO/VICODIN) 5-325 MG tablet One every 6 hours if needed for pain 100 tablet 0  . magnesium 30 MG tablet Take two tablets by mouth once daily    . MILK THISTLE EXTRACT PO Take by mouth.    . Misc Natural Products (ECHINACEA GOLDENSEAL PLUS PO) Take by mouth. Take 2 capsules once daily.    . Multiple Vitamins-Minerals (ADVANCED DIABETIC MULTIVITAMIN PO) Take by mouth. Take 1 packet once daily.    . Nutritional Supplements (NUTRITIONAL SUPPLEMENT PO) Take by mouth. Glucocil= 2 tablets BID    . OVER THE COUNTER MEDICATION Yacon Root take 1 teaspoon as needed to lower blood sugar    . Red Yeast Rice Extract (RED YEAST RICE PO) Take by mouth. Take two tablespoons in evening for cholesterol    . vitamin C (ASCORBIC ACID) 500 MG tablet Take 1,000 mg by mouth 3 (three) times daily. Take 1 tablet once daily     No  current facility-administered medications for this visit.     Allergies as of 05/20/2016 - Review Complete 05/20/2016  Allergen Reaction Noted  . Aspirin  11/08/2011  . Darvocet [propoxyphene n-acetaminophen]  11/08/2011  . Erythromycin  03/28/2013  . Ibuprofen  03/28/2013    Family History  Problem Relation Age of Onset  . Diabetes Mother   . Hypertension Mother   . Arthritis Mother     RA  . Pancreatic cancer Father   . Stomach cancer Neg Hx   . Colon cancer Neg Hx     Social History   Social History  . Marital status: Single    Spouse name: N/A  . Number of children: N/A  . Years of  education: N/A   Occupational History  . Not on file.   Social History Main Topics  . Smoking status: Never Smoker  . Smokeless tobacco: Never Used  . Alcohol use No  . Drug use: No  . Sexual activity: Not Currently   Other Topics Concern  . Not on file   Social History Narrative  . No narrative on file     Physical Exam: Ht 5' 3.39" (1.61 m) Comment: w/o shoes  Wt 118 lb 4 oz (53.6 kg)   BMI 20.69 kg/m  Constitutional: generally well-appearing Psychiatric: alert and oriented x3 Eyes: extraocular movements intact Mouth: oral pharynx moist, no lesions Neck: supple no lymphadenopathy Cardiovascular: heart regular rate and rhythm Lungs: clear to auscultation bilaterally Abdomen: soft, nontender, nondistended, no obvious ascites, no peritoneal signs, normal bowel sounds Extremities: no lower extremity edema bilaterally Skin: no lesions on visible extremities   Assessment and plan: 55 y.o. 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 She will be set up for recall colonoscopy 3 years from the time of that last colonoscopy. Does not need to be repeated now. Her abdominal discomforts which are low in her pelvis bilaterally, not affected by her bowels seem functional. She takes multiple herbal supplements and perhaps those are causing some side effects. I asked her to try to quit and if she could. I recommend pelvic CT scan to rule out significant mass lesions, other pathology. She has left sided sciatica. She thinks this is related to her polyp resection which I explained to her is highly unlikely. Going to order a lumbosacral MRI to see if she has disc disease which seems much more likely.   Owens Loffler, MD [facility] 05/20/2016, 2:25 PM  Cc: Estill Dooms, MD

## 2016-05-26 ENCOUNTER — Other Ambulatory Visit (INDEPENDENT_AMBULATORY_CARE_PROVIDER_SITE_OTHER): Payer: BLUE CROSS/BLUE SHIELD

## 2016-05-26 DIAGNOSIS — M5442 Lumbago with sciatica, left side: Secondary | ICD-10-CM | POA: Diagnosis not present

## 2016-05-26 DIAGNOSIS — R1030 Lower abdominal pain, unspecified: Secondary | ICD-10-CM | POA: Diagnosis not present

## 2016-05-26 LAB — BUN: BUN: 7 mg/dL (ref 6–23)

## 2016-05-26 LAB — CREATININE, SERUM: Creatinine, Ser: 0.66 mg/dL (ref 0.40–1.20)

## 2016-05-27 ENCOUNTER — Ambulatory Visit (INDEPENDENT_AMBULATORY_CARE_PROVIDER_SITE_OTHER)
Admission: RE | Admit: 2016-05-27 | Discharge: 2016-05-27 | Disposition: A | Discharge status: Home / Self Care | Payer: BLUE CROSS/BLUE SHIELD | Source: Ambulatory Visit | Attending: Gastroenterology | Admitting: Gastroenterology

## 2016-05-27 ENCOUNTER — Other Ambulatory Visit: Payer: BLUE CROSS/BLUE SHIELD

## 2016-05-27 DIAGNOSIS — M5442 Lumbago with sciatica, left side: Secondary | ICD-10-CM | POA: Diagnosis not present

## 2016-05-27 DIAGNOSIS — R1030 Lower abdominal pain, unspecified: Secondary | ICD-10-CM

## 2016-05-27 MED ORDER — IOPAMIDOL (ISOVUE-300) INJECTION 61%
100.0000 mL | Freq: Once | INTRAVENOUS | Status: AC | PRN
  Administered 2016-05-27: 100 mL via INTRAVENOUS

## 2016-05-29 ENCOUNTER — Ambulatory Visit (HOSPITAL_COMMUNITY)
Admission: RE | Admit: 2016-05-29 | Discharge: 2016-05-29 | Disposition: A | Discharge status: Home / Self Care | Payer: BLUE CROSS/BLUE SHIELD | Source: Ambulatory Visit | Attending: Gastroenterology | Admitting: Gastroenterology

## 2016-05-29 DIAGNOSIS — M5126 Other intervertebral disc displacement, lumbar region: Secondary | ICD-10-CM | POA: Diagnosis not present

## 2016-05-29 DIAGNOSIS — M5442 Lumbago with sciatica, left side: Secondary | ICD-10-CM | POA: Diagnosis present

## 2016-05-29 DIAGNOSIS — R1030 Lower abdominal pain, unspecified: Secondary | ICD-10-CM

## 2016-05-29 LAB — POCT I-STAT CREATININE: Creatinine, Ser: 0.6 mg/dL (ref 0.44–1.00)

## 2016-05-29 MED ORDER — GADOBENATE DIMEGLUMINE 529 MG/ML IV SOLN
10.0000 mL | Freq: Once | INTRAVENOUS | Status: AC | PRN
  Administered 2016-05-29: 10 mL via INTRAVENOUS

## 2016-06-02 ENCOUNTER — Telehealth: Payer: Self-pay | Admitting: Gastroenterology

## 2016-06-02 LAB — HM MAMMOGRAPHY: HM Mammogram: NORMAL (ref 0–4)

## 2016-06-02 NOTE — Telephone Encounter (Signed)
The patient has been notified of this information and all questions answered.

## 2016-06-03 ENCOUNTER — Ambulatory Visit (INDEPENDENT_AMBULATORY_CARE_PROVIDER_SITE_OTHER): Payer: BLUE CROSS/BLUE SHIELD | Admitting: Internal Medicine

## 2016-06-03 ENCOUNTER — Encounter: Payer: Self-pay | Admitting: Internal Medicine

## 2016-06-03 VITALS — BP 160/100 | HR 92 | Temp 98.4°F | Ht 63.0 in | Wt 120.0 lb

## 2016-06-03 DIAGNOSIS — N809 Endometriosis, unspecified: Secondary | ICD-10-CM | POA: Diagnosis not present

## 2016-06-03 DIAGNOSIS — G8929 Other chronic pain: Secondary | ICD-10-CM

## 2016-06-03 DIAGNOSIS — I10 Essential (primary) hypertension: Secondary | ICD-10-CM

## 2016-06-03 DIAGNOSIS — M549 Dorsalgia, unspecified: Secondary | ICD-10-CM | POA: Insufficient documentation

## 2016-06-03 DIAGNOSIS — Z86010 Personal history of colonic polyps: Secondary | ICD-10-CM | POA: Diagnosis not present

## 2016-06-03 DIAGNOSIS — M5442 Lumbago with sciatica, left side: Secondary | ICD-10-CM | POA: Diagnosis not present

## 2016-06-03 DIAGNOSIS — E119 Type 2 diabetes mellitus without complications: Secondary | ICD-10-CM

## 2016-06-03 MED ORDER — HYDROCODONE-ACETAMINOPHEN 5-325 MG PO TABS: ORAL_TABLET | ORAL | 0 refills | Status: DC

## 2016-06-03 NOTE — Progress Notes (Signed)
Facility  [facility]    Place of Service:   OFFICE    Allergies  Allergen Reactions  . Aspirin     Intolerance (aspirin sensitive stomach)  . Darvocet [Propoxyphene N-Acetaminophen]   . Erythromycin   . Ibuprofen     Chief Complaint  Patient presents with  . Medical Management of Chronic Issues    talk about MRI lumbar spin 05/29/16    HPI:  Low back pain since she had colonoscopy and polypectomy. in Sept 2016. She is trying to exercise. It is painful to ride a bike. Using Vicodin for pain relief. MRI of LS  On 05/29/16 showed bulging discs without significant foraminal stenoses.  Left wrist brace due to pain in left wrist ulnar side about 1 month ago. Occurred as injury sustained when she got between 2 dogs.  BP was high at Dr. Ardis Hughs office. She says BP at home was then 125/59.  Endometriosis - Plan: benefits from HYDROcodone-acetaminophen (NORCO/VICODIN) 5-325 MG tablet  Diabetes mellitus without complication ([place]) - due for recheck with lab on 07/01/16.  History of colonic polyps - Dr. Paulita Fujita said she can wait up to 3 years prior to repeat colonoscopy.    Medications: Patient's Medications  New Prescriptions   No medications on file  Previous Medications   ALPRAZOLAM (NIRAVAM) 0.5 MG DISSOLVABLE TABLET    Take 1 tablet (0.5 mg total) by mouth 2 (two) times daily as needed for anxiety.   CHROMIUM-CINNAMON (CINNAMON PLUS CHROMIUM PO)    Take 1,000 mg by mouth 2 (two) times daily with a meal.   FOLIC ACID (FOLVITE) 695 MCG TABLET    Take 400 mcg by mouth daily.   GLYBURIDE (DIABETA) 5 MG TABLET    One before breakfast and 1 before dinner to control diabetes   HYDROCODONE-ACETAMINOPHEN (NORCO/VICODIN) 5-325 MG TABLET    One every 6 hours if needed for pain   MAGNESIUM 30 MG TABLET    Take two tablets by mouth once daily   MILK THISTLE EXTRACT PO    Take by mouth.   MISC NATURAL PRODUCTS (ECHINACEA GOLDENSEAL PLUS PO)    Take by mouth. Take 2 capsules once daily.   MULTIPLE VITAMINS-MINERALS (ADVANCED DIABETIC MULTIVITAMIN PO)    Take by mouth. Take 1 packet once daily.   NUTRITIONAL SUPPLEMENTS (NUTRITIONAL SUPPLEMENT PO)    Take by mouth. Glucocil= 2 tablets BID   OVER THE COUNTER MEDICATION    Yacon Root take 1 teaspoon as needed to lower blood sugar   RED YEAST RICE EXTRACT (RED YEAST RICE PO)    Take by mouth. Take two tablespoons in evening for cholesterol   VITAMIN C (ASCORBIC ACID) 500 MG TABLET    Take 1,000 mg by mouth 3 (three) times daily. Take 1 tablet once daily  Modified Medications   No medications on file  Discontinued Medications   No medications on file    Review of Systems  Constitutional: Negative.   HENT: Negative.   Eyes: Negative.   Respiratory: Negative.   Cardiovascular: Negative.   Gastrointestinal: Positive for abdominal distention.  Endocrine: Negative for heat intolerance, polydipsia, polyphagia and polyuria.       Diabetic  Genitourinary: Negative.        Hx endometriosis.  Musculoskeletal: Positive for back pain.  Allergic/Immunologic: Negative.   Neurological: Negative.   Hematological: Negative.   Psychiatric/Behavioral: The patient is nervous/anxious.     Vitals:   06/03/16 1549  BP: (!) 148/98  Pulse: 92  Temp: 98.4 F (36.9 C)  TempSrc: Oral  SpO2: 98%  Weight: 120 lb (54.4 kg)  Height: 5' 3"  (1.6 m)   Body mass index is 21.26 kg/m. Wt Readings from Last 3 Encounters:  06/03/16 120 lb (54.4 kg)  05/20/16 118 lb 4 oz (53.6 kg)  12/17/15 115 lb (52.2 kg)      Physical Exam  Constitutional: She is oriented to person, place, and time. She appears well-developed and well-nourished. No distress.  HENT:  Head: Normocephalic and atraumatic.  Right Ear: External ear normal.  Left Ear: External ear normal.  Nose: Nose normal.  Mouth/Throat: No oropharyngeal exudate.  Eyes: Conjunctivae and EOM are normal. Pupils are equal, round, and reactive to light. Right eye exhibits no discharge. Left eye  exhibits no discharge.  Neck: Neck supple. No JVD present. No tracheal deviation present. No thyromegaly present.  Cardiovascular: Normal rate, regular rhythm, normal heart sounds and intact distal pulses.  Exam reveals no friction rub.   No murmur heard. Pulmonary/Chest: Breath sounds normal. No respiratory distress. She has no wheezes. She exhibits no tenderness.  Abdominal: Bowel sounds are normal. She exhibits no distension and no mass. There is no tenderness.  Musculoskeletal: Normal range of motion. She exhibits no edema or tenderness.  Lymphadenopathy:    She has no cervical adenopathy.  Neurological: She is alert and oriented to person, place, and time. She has normal reflexes. No cranial nerve deficit. Coordination normal.  Skin: No rash noted. No erythema. No pallor.  Scarred nodular feeling area of the right 4th finger proximal phalanx dorsum. Large black seborrheic keratosis of the left upper breast.  Psychiatric: She has a normal mood and affect. Her behavior is normal. Judgment and thought content normal.    Labs reviewed: Lab Summary Latest Ref Rng & Units 05/29/2016 05/26/2016 12/13/2015 08/02/2015 01/30/2015  Hemoglobin 13.0-17.0 g/dL (None) (None) (None) (None) (None)  Hematocrit 39.0-52.0 % (None) (None) (None) (None) (None)  White count - (None) (None) (None) (None) (None)  Platelet count - (None) (None) (None) (None) (None)  Sodium 134 - 144 mmol/L (None) (None) 136 140 136  Potassium 3.5 - 5.2 mmol/L (None) (None) 4.4 4.2 4.2  Calcium 8.7 - 10.2 mg/dL (None) (None) 9.3 9.8 9.4  Phosphorus - (None) (None) (None) (None) (None)  Creatinine 0.44 - 1.00 mg/dL 0.60 0.66 0.77 0.63 0.65  AST - (None) (None) (None) (None) (None)  Alk Phos - (None) (None) (None) (None) (None)  Bilirubin - (None) (None) (None) (None) (None)  Glucose 65 - 99 mg/dL (None) (None) 229(H) 237(H) 357(H)  Cholesterol - (None) (None) (None) (None) (None)  HDL cholesterol >39 mg/dL (None) (None) 41 50  44  Triglycerides 0 - 149 mg/dL (None) (None) 166(H) 116 322(H)  LDL Direct - (None) (None) (None) (None) (None)  LDL Calc 0 - 99 mg/dL (None) (None) 118(H) 140(H) 144(H)  Total protein - (None) (None) (None) (None) (None)  Albumin - (None) (None) (None) (None) (None)  Some recent data might be hidden   No results found for: TSH, T3TOTAL, T4TOTAL, THYROIDAB Lab Results  Component Value Date   BUN 7 05/26/2016   BUN 7 12/13/2015   BUN 10 08/02/2015   Lab Results  Component Value Date   HGBA1C 13.1 (H) 12/13/2015   HGBA1C 11.7 (H) 08/02/2015   HGBA1C 12.7 (H) 01/30/2015   Mr Lumbar Spine W Wo Contrast  Result Date: 05/29/2016 CLINICAL DATA:  Low back pain radiating to the left leg. EXAM: MRI LUMBAR SPINE WITHOUT AND  WITH CONTRAST TECHNIQUE: Multiplanar and multiecho pulse sequences of the lumbar spine were obtained without and with intravenous contrast. CONTRAST:  41m MULTIHANCE GADOBENATE DIMEGLUMINE 529 MG/ML IV SOLN COMPARISON:  None. FINDINGS: Segmentation:  Standard. Alignment:  Physiologic. Vertebrae:  No fracture, evidence of discitis, or bone lesion. Conus medullaris: Extends to the L1 level and appears normal. Paraspinal and other soft tissues: Negative. Disc levels: Disc spaces: Disc spaces are preserved. T12-L1: No significant disc bulge. No evidence of neural foraminal stenosis. No central canal stenosis. L1-L2: Mild broad-based disc bulge. No evidence of neural foraminal stenosis. No central canal stenosis. L2-L3: No significant disc bulge. No evidence of neural foraminal stenosis. No central canal stenosis. L3-L4: No significant disc bulge. No evidence of neural foraminal stenosis. No central canal stenosis. L4-L5: Shallow right foraminal disc protrusion. No evidence of neural foraminal stenosis. No central canal stenosis. L5-S1: Broad-based disc bulge. No evidence of neural foraminal stenosis. No central canal stenosis. IMPRESSION: 1. At L4-5 there is a shallow right foraminal  disc protrusion. 2. At L5-S1 there is a broad-based disc bulge. Electronically Signed   By: HKathreen Devoid  On: 05/29/2016 17:16   Assessment/Plan  1. Chronic midline low back pain with left-sided sciatica Continue to try to exercise. Use Vicodin if needed.  2. Essential hypertension Mild elevation today likely related to stress  3. Endometriosis GYN said she does not need hysterectomy because heer uterus is getting smaller - HYDROcodone-acetaminophen (NORCO/VICODIN) 5-325 MG tablet; One every 6 hours if needed for pain  Dispense: 100 tablet; Refill: 0  4. Diabetes mellitus without complication ([place] Follow up in Nov 2017.  5. History of colonic polyps Repeat colonoscopy in 2020.

## 2016-06-03 NOTE — Patient Instructions (Signed)
Continue current medications. 

## 2016-06-24 ENCOUNTER — Ambulatory Visit
Admission: RE | Admit: 2016-06-24 | Discharge: 2016-06-24 | Disposition: A | Discharge status: Home / Self Care | Payer: BLUE CROSS/BLUE SHIELD | Source: Ambulatory Visit | Attending: Internal Medicine | Admitting: Internal Medicine

## 2016-06-24 ENCOUNTER — Other Ambulatory Visit: Payer: Self-pay | Admitting: Internal Medicine

## 2016-06-24 DIAGNOSIS — M25539 Pain in unspecified wrist: Secondary | ICD-10-CM | POA: Insufficient documentation

## 2016-06-24 DIAGNOSIS — M25532 Pain in left wrist: Secondary | ICD-10-CM

## 2016-06-29 ENCOUNTER — Other Ambulatory Visit: Payer: BLUE CROSS/BLUE SHIELD

## 2016-06-29 DIAGNOSIS — E119 Type 2 diabetes mellitus without complications: Secondary | ICD-10-CM

## 2016-06-29 LAB — BASIC METABOLIC PANEL WITH GFR
BUN: 9 mg/dL (ref 7–25)
CO2: 31 mmol/L (ref 20–31)
Calcium: 9.3 mg/dL (ref 8.6–10.4)
Chloride: 101 mmol/L (ref 98–110)
Creat: 0.55 mg/dL (ref 0.50–1.05)
GFR, Est African American: >89 mL/min (ref >=60)
GFR, Est Non African American: >89 mL/min (ref >=60)
Glucose, Bld: 193 mg/dL — ABNORMAL HIGH (ref 65–99)
Potassium: 3.6 mmol/L (ref 3.5–5.3)
Sodium: 140 mmol/L (ref 135–146)

## 2016-06-30 LAB — HEMOGLOBIN A1C
Hgb A1c MFr Bld: 11 % — ABNORMAL HIGH (ref <5.7)
Mean Plasma Glucose: 269 mg/dL

## 2016-07-01 ENCOUNTER — Ambulatory Visit (INDEPENDENT_AMBULATORY_CARE_PROVIDER_SITE_OTHER): Payer: BLUE CROSS/BLUE SHIELD | Admitting: Internal Medicine

## 2016-07-01 ENCOUNTER — Encounter: Payer: Self-pay | Admitting: Internal Medicine

## 2016-07-01 VITALS — BP 144/90 | HR 87 | Temp 97.9°F | Ht 63.0 in | Wt 121.0 lb

## 2016-07-01 DIAGNOSIS — E785 Hyperlipidemia, unspecified: Secondary | ICD-10-CM

## 2016-07-01 DIAGNOSIS — M25532 Pain in left wrist: Secondary | ICD-10-CM | POA: Diagnosis not present

## 2016-07-01 DIAGNOSIS — I10 Essential (primary) hypertension: Secondary | ICD-10-CM

## 2016-07-01 DIAGNOSIS — N809 Endometriosis, unspecified: Secondary | ICD-10-CM

## 2016-07-01 DIAGNOSIS — E119 Type 2 diabetes mellitus without complications: Secondary | ICD-10-CM

## 2016-07-01 DIAGNOSIS — R1032 Left lower quadrant pain: Secondary | ICD-10-CM

## 2016-07-01 DIAGNOSIS — M5442 Lumbago with sciatica, left side: Secondary | ICD-10-CM

## 2016-07-01 DIAGNOSIS — F419 Anxiety disorder, unspecified: Secondary | ICD-10-CM | POA: Insufficient documentation

## 2016-07-01 DIAGNOSIS — G8929 Other chronic pain: Secondary | ICD-10-CM

## 2016-07-01 DIAGNOSIS — F411 Generalized anxiety disorder: Secondary | ICD-10-CM

## 2016-07-01 HISTORY — DX: Anxiety disorder, unspecified: F41.9

## 2016-07-01 MED ORDER — ALPRAZOLAM 0.5 MG PO TBDP: 0.5000 mg | ORAL_TABLET | Freq: Two times a day (BID) | ORAL | 3 refills | Status: DC | PRN

## 2016-07-01 MED ORDER — GLIPIZIDE 10 MG PO TABS: ORAL_TABLET | ORAL | 3 refills | Status: DC

## 2016-07-01 NOTE — Progress Notes (Signed)
Facility  [facility]    Place of Service:   OFFICE    Allergies  Allergen Reactions  . Aspirin     Intolerance (aspirin sensitive stomach)  . Darvocet [Propoxyphene N-Acetaminophen]   . Erythromycin   . Ibuprofen     Chief Complaint  Patient presents with  . Medical Management of Chronic Issues    3 month routine visit  . Flu Vaccine    refused    HPI:  Last seen 06/03/16 for low back pain, left wrist pain, BP, DM, and endometriosis.  Diabetes mellitus without complication (HCC) - poor control. Has improved diet and eliminated sweets.  Essential hypertension - controlled  Abdominal pain, left lower quadrant  Chronic midline low back pain with left-sided sciatica - continues with pain in the back and radiation down the left leg.  Left wrist pain - continues to wear splint. Xray showed soft tissue swelling at the ulnar styloid.  Hyperlipidemia, unspecified hyperlipidemia type - follow in th future  Endometriosis - likel associated with her chronic LLQ apin.     Medications: Patient's Medications  New Prescriptions   No medications on file  Previous Medications   ALPRAZOLAM (NIRAVAM) 0.5 MG DISSOLVABLE TABLET    Take 1 tablet (0.5 mg total) by mouth 2 (two) times daily as needed for anxiety.   CHROMIUM-CINNAMON (CINNAMON PLUS CHROMIUM PO)    Take 1,000 mg by mouth 2 (two) times daily with a meal.   FOLIC ACID (FOLVITE) 163 MCG TABLET    Take 400 mcg by mouth daily.   GLYBURIDE (DIABETA) 5 MG TABLET    One before breakfast and 1 before dinner to control diabetes   HYDROCODONE-ACETAMINOPHEN (NORCO/VICODIN) 5-325 MG TABLET    One every 6 hours if needed for pain   MAGNESIUM 30 MG TABLET    Take two tablets by mouth once daily   MILK THISTLE EXTRACT PO    Take by mouth.   MISC NATURAL PRODUCTS (ECHINACEA GOLDENSEAL PLUS PO)    Take by mouth. Take 2 capsules once daily.   MULTIPLE VITAMINS-MINERALS (ADVANCED DIABETIC MULTIVITAMIN PO)    Take by mouth. Take 1 packet once  daily.   NUTRITIONAL SUPPLEMENTS (NUTRITIONAL SUPPLEMENT PO)    Take by mouth. Glucocil= 2 tablets BID   OVER THE COUNTER MEDICATION    Yacon Root take 1 teaspoon as needed to lower blood sugar   RED YEAST RICE EXTRACT (RED YEAST RICE PO)    Take by mouth. Take two tablespoons in evening for cholesterol   VITAMIN C (ASCORBIC ACID) 500 MG TABLET    Take 1,000 mg by mouth 3 (three) times daily. Take 1 tablet once daily  Modified Medications   No medications on file  Discontinued Medications   No medications on file    Review of Systems  Constitutional: Negative.   HENT: Negative.   Eyes: Negative.   Respiratory: Negative.   Cardiovascular: Negative.   Gastrointestinal: Positive for abdominal distention.       LLQ pains intermittently  Endocrine: Negative for heat intolerance, polydipsia, polyphagia and polyuria.       Diabetic  Genitourinary: Negative.        Hx endometriosis.  Musculoskeletal: Positive for back pain.  Allergic/Immunologic: Negative.   Neurological: Negative.   Hematological: Negative.   Psychiatric/Behavioral: The patient is nervous/anxious.     Vitals:   07/01/16 1316  BP: (!) 144/90  Pulse: 87  Temp: 97.9 F (36.6 C)  TempSrc: Oral  SpO2: 98%  Weight: 121 lb (54.9 kg)  Height: 5' 3" (1.6 m)   Body mass index is 21.43 kg/m. Wt Readings from Last 3 Encounters:  07/01/16 121 lb (54.9 kg)  06/03/16 120 lb (54.4 kg)  05/20/16 118 lb 4 oz (53.6 kg)      Physical Exam  Constitutional: She is oriented to person, place, and time. She appears well-developed and well-nourished. No distress.  HENT:  Head: Normocephalic and atraumatic.  Right Ear: External ear normal.  Left Ear: External ear normal.  Nose: Nose normal.  Mouth/Throat: No oropharyngeal exudate.  Eyes: Conjunctivae and EOM are normal. Pupils are equal, round, and reactive to light. Right eye exhibits no discharge. Left eye exhibits no discharge.  Neck: Neck supple. No JVD present. No  tracheal deviation present. No thyromegaly present.  Cardiovascular: Normal rate, regular rhythm, normal heart sounds and intact distal pulses.  Exam reveals no friction rub.   No murmur heard. Pulmonary/Chest: Breath sounds normal. No respiratory distress. She has no wheezes. She exhibits no tenderness.  Abdominal: Bowel sounds are normal. She exhibits no distension and no mass. There is tenderness (LLQ ).  Musculoskeletal: Normal range of motion. She exhibits no edema or tenderness.  Lymphadenopathy:    She has no cervical adenopathy.  Neurological: She is alert and oriented to person, place, and time. She has normal reflexes. No cranial nerve deficit. Coordination normal.  Skin: No rash noted. No erythema. No pallor.  Scarred nodular feeling area of the right 4th finger proximal phalanx dorsum. Large black seborrheic keratosis of the left upper breast.  Psychiatric: She has a normal mood and affect. Her behavior is normal. Judgment and thought content normal.    Labs reviewed: Lab Summary Latest Ref Rng & Units 06/29/2016 05/29/2016 05/26/2016 12/13/2015 08/02/2015 01/30/2015  Hemoglobin 13.0-17.0 g/dL (None) (None) (None) (None) (None) (None)  Hematocrit 39.0-52.0 % (None) (None) (None) (None) (None) (None)  White count - (None) (None) (None) (None) (None) (None)  Platelet count - (None) (None) (None) (None) (None) (None)  Sodium 135 - 146 mmol/L 140 (None) (None) 136 140 136  Potassium 3.5 - 5.3 mmol/L 3.6 (None) (None) 4.4 4.2 4.2  Calcium 8.6 - 10.4 mg/dL 9.3 (None) (None) 9.3 9.8 9.4  Phosphorus - (None) (None) (None) (None) (None) (None)  Creatinine 0.50 - 1.05 mg/dL 0.55 0.60 0.66 0.77 0.63 0.65  AST - (None) (None) (None) (None) (None) (None)  Alk Phos - (None) (None) (None) (None) (None) (None)  Bilirubin - (None) (None) (None) (None) (None) (None)  Glucose 65 - 99 mg/dL 193(H) (None) (None) 229(H) 237(H) 357(H)  Cholesterol - (None) (None) (None) (None) (None) (None)  HDL  cholesterol >39 mg/dL (None) (None) (None) 41 50 44  Triglycerides 0 - 149 mg/dL (None) (None) (None) 166(H) 116 322(H)  LDL Direct - (None) (None) (None) (None) (None) (None)  LDL Calc 0 - 99 mg/dL (None) (None) (None) 118(H) 140(H) 144(H)  Total protein - (None) (None) (None) (None) (None) (None)  Albumin - (None) (None) (None) (None) (None) (None)  Some recent data might be hidden   No results found for: TSH, T3TOTAL, T4TOTAL, THYROIDAB Lab Results  Component Value Date   BUN 9 06/29/2016   BUN 7 05/26/2016   BUN 7 12/13/2015   Lab Results  Component Value Date   HGBA1C 11.0 (H) 06/29/2016   HGBA1C 13.1 (H) 12/13/2015   HGBA1C 11.7 (H) 08/02/2015    Assessment/Plan  1. Diabetes mellitus without complication ([place]) -stop glipizide - Hemoglobin A1c; Future - Comprehensive  metabolic panel; Future - glipiZIDE (GLUCOTROL) 10 MG tablet; One before breakfast and one before supper to control diabetes  Dispense: 180 tablet; Refill: 3 - Microalbumin, urine; Future  2. Essential hypertension controlled  3. Abdominal pain, left lower quadrant Chronic LLQ pain is unchanged  4. Chronic midline low back pain with left-sided sciatica unchanged  5. Left wrist pain -try Aspercream with lidocaine qid Continue splint until pain free.  6. Hyperlipidemia, unspecified hyperlipidemia type FU in future  7. Endometriosis Has been told her uterus is shrinkng and she does not need surgery  8. Generalized anxiety disorder - ALPRAZolam (NIRAVAM) 0.5 MG dissolvable tablet; Take 1 tablet (0.5 mg total) by mouth 2 (two) times daily as needed for anxiety.  Dispense: 60 tablet; Refill: 3

## 2016-09-29 ENCOUNTER — Other Ambulatory Visit: Payer: Self-pay | Admitting: Internal Medicine

## 2016-09-29 DIAGNOSIS — F411 Generalized anxiety disorder: Secondary | ICD-10-CM

## 2016-10-05 ENCOUNTER — Other Ambulatory Visit: Payer: BLUE CROSS/BLUE SHIELD

## 2016-10-07 ENCOUNTER — Ambulatory Visit: Payer: BLUE CROSS/BLUE SHIELD | Admitting: Internal Medicine

## 2016-11-13 ENCOUNTER — Other Ambulatory Visit: Payer: BLUE CROSS/BLUE SHIELD

## 2016-11-17 ENCOUNTER — Ambulatory Visit: Payer: BLUE CROSS/BLUE SHIELD | Admitting: Internal Medicine

## 2016-11-27 ENCOUNTER — Other Ambulatory Visit: Payer: BLUE CROSS/BLUE SHIELD

## 2016-11-30 ENCOUNTER — Other Ambulatory Visit: Payer: BLUE CROSS/BLUE SHIELD

## 2016-11-30 DIAGNOSIS — E119 Type 2 diabetes mellitus without complications: Secondary | ICD-10-CM

## 2016-11-30 LAB — COMPREHENSIVE METABOLIC PANEL
ALT: 7 U/L (ref 6–29)
AST: 12 U/L (ref 10–35)
Albumin: 3.9 g/dL (ref 3.6–5.1)
Alkaline Phosphatase: 97 U/L (ref 33–130)
BUN: 11 mg/dL (ref 7–25)
CO2: 22 mmol/L (ref 20–31)
Calcium: 9 mg/dL (ref 8.6–10.4)
Chloride: 103 mmol/L (ref 98–110)
Creat: 0.63 mg/dL (ref 0.50–1.05)
Glucose, Bld: 301 mg/dL — ABNORMAL HIGH (ref 65–99)
Potassium: 3.9 mmol/L (ref 3.5–5.3)
Sodium: 137 mmol/L (ref 135–146)
Total Bilirubin: 0.3 mg/dL (ref 0.2–1.2)
Total Protein: 7.3 g/dL (ref 6.1–8.1)

## 2016-12-01 ENCOUNTER — Encounter: Payer: Self-pay | Admitting: Internal Medicine

## 2016-12-01 ENCOUNTER — Ambulatory Visit (INDEPENDENT_AMBULATORY_CARE_PROVIDER_SITE_OTHER): Payer: BLUE CROSS/BLUE SHIELD | Admitting: Internal Medicine

## 2016-12-01 VITALS — BP 144/96 | HR 94 | Temp 98.2°F | Ht 63.0 in | Wt 127.0 lb

## 2016-12-01 DIAGNOSIS — F411 Generalized anxiety disorder: Secondary | ICD-10-CM

## 2016-12-01 DIAGNOSIS — N809 Endometriosis, unspecified: Secondary | ICD-10-CM | POA: Diagnosis not present

## 2016-12-01 DIAGNOSIS — I10 Essential (primary) hypertension: Secondary | ICD-10-CM

## 2016-12-01 DIAGNOSIS — E119 Type 2 diabetes mellitus without complications: Secondary | ICD-10-CM | POA: Diagnosis not present

## 2016-12-01 DIAGNOSIS — E785 Hyperlipidemia, unspecified: Secondary | ICD-10-CM

## 2016-12-01 DIAGNOSIS — F331 Major depressive disorder, recurrent, moderate: Secondary | ICD-10-CM | POA: Diagnosis not present

## 2016-12-01 LAB — HEMOGLOBIN A1C
Hgb A1c MFr Bld: 11.3 % — ABNORMAL HIGH (ref <5.7)
Mean Plasma Glucose: 278 mg/dL

## 2016-12-01 LAB — MICROALBUMIN, URINE: Microalb, Ur: 9.6 mg/dL

## 2016-12-01 MED ORDER — HYDROCODONE-ACETAMINOPHEN 5-325 MG PO TABS: ORAL_TABLET | ORAL | 0 refills | Status: DC

## 2016-12-01 MED ORDER — ALPRAZOLAM 0.5 MG PO TABS: ORAL_TABLET | ORAL | 0 refills | Status: DC

## 2016-12-01 MED ORDER — ALPRAZOLAM 0.5 MG PO TBDP: ORAL_TABLET | ORAL | 0 refills | Status: DC

## 2016-12-01 NOTE — Progress Notes (Signed)
Facility  [facility]    Place of Service:   OFFICE    Allergies  Allergen Reactions  . Aspirin     Intolerance (aspirin sensitive stomach)  . Darvocet [Propoxyphene N-Acetaminophen]   . Erythromycin   . Ibuprofen   . Metformin And Related Nausea Only    Chief Complaint  Patient presents with  . Medical Management of Chronic Issues    blood pressure, blood sugar, cholesterol. Review labs.    HPI:  Diabetes mellitus without complication ([place]) - poor control. She blames on a very stressful home situation. Not eating right. Skips her medication sometimes.  Essential hypertension - she blames current [poor control on stress  Depression, major, recurrent, moderate ([place]) - husband is on dialysis. Mother is undergoing memory loss and personality changes.  Generalized anxiety disorder - she is feeling that her mother blames her for poor outcomes in everythng. She gets no appreciation for what she is providing.  Endometriosis - chronic pains controlled with HYDROcodone-acetaminophen (NORCO/VICODIN) 5-325 MG tablet  Hyperlipidemia, unspecified hyperlipidemia type - no recent trouble.    Medications: Patient's Medications  New Prescriptions   No medications on file  Previous Medications   CHROMIUM-CINNAMON (CINNAMON PLUS CHROMIUM PO)    Take 1,000 mg by mouth 2 (two) times daily with a meal.   DIPHENHYDRAMINE (BENADRYL) 25 MG TABLET    Take 25 mg by mouth. 2 tablets once daily as needed for allergies   FOLIC ACID (FOLVITE) 254 MCG TABLET    Take 400 mcg by mouth daily.   GLIPIZIDE (GLUCOTROL) 10 MG TABLET    One before breakfast and one before supper to control diabetes   MILK THISTLE EXTRACT PO    Take by mouth.   MISC NATURAL PRODUCTS (ECHINACEA GOLDENSEAL PLUS PO)    Take by mouth. Take 2 capsules once daily.   MISC NATURAL PRODUCTS (LYDIA [place]/HERBAL COMPOUND PO)    Take by mouth. Take 2 tablets once daily   MULTIPLE VITAMINS-MINERALS (ADVANCED DIABETIC MULTIVITAMIN PO)     Take by mouth. Take 1 packet once daily.   NUTRITIONAL SUPPLEMENTS (NUTRITIONAL SUPPLEMENT PO)    Take by mouth. Glucocil= 2 tablets BID   OVER THE COUNTER MEDICATION    Yacon Root take 1 teaspoon as needed to lower blood sugar   RED YEAST RICE EXTRACT (RED YEAST RICE PO)    Take by mouth. Take two tablespoons in evening for cholesterol   VITAMIN C (ASCORBIC ACID) 500 MG TABLET    Take 1,000 mg by mouth 3 (three) times daily. Take 1 tablet once daily  Modified Medications   Modified Medication Previous Medication   ALPRAZOLAM (NIRAVAM) 0.5 MG DISSOLVABLE TABLET ALPRAZolam (NIRAVAM) 0.5 MG dissolvable tablet      Take one tablet by mouth twice a day as needed for anxiety    TAKE 1 TABLET BY MOUTH TWICE A DAY AS NEEDED FOR ANXIETY   HYDROCODONE-ACETAMINOPHEN (NORCO/VICODIN) 5-325 MG TABLET HYDROcodone-acetaminophen (NORCO/VICODIN) 5-325 MG tablet      One every 6 hours if needed for pain    One every 6 hours if needed for pain  Discontinued Medications   MAGNESIUM 30 MG TABLET    Take two tablets by mouth once daily    Review of Systems  Constitutional: Negative.  Negative for activity change, appetite change, chills, diaphoresis, fatigue, fever and unexpected weight change.  HENT: Negative.  Negative for congestion, ear discharge, ear pain, hearing loss, postnasal drip, rhinorrhea, sore throat, tinnitus, trouble swallowing and voice  change.   Eyes: Negative.  Negative for pain, redness, itching and visual disturbance.  Respiratory: Negative.  Negative for cough, choking, shortness of breath and wheezing.   Cardiovascular: Negative.  Negative for chest pain, palpitations and leg swelling.  Gastrointestinal: Positive for abdominal distention. Negative for abdominal pain, constipation, diarrhea and nausea.       LLQ pains intermittently  Endocrine: Negative for cold intolerance, heat intolerance, polydipsia, polyphagia and polyuria.       Diabetic  Genitourinary: Negative.  Negative for  difficulty urinating, dysuria, flank pain, frequency, hematuria, pelvic pain, urgency and vaginal discharge.       Hx endometriosis.  Musculoskeletal: Positive for back pain. Negative for arthralgias, gait problem, myalgias, neck pain and neck stiffness.  Skin: Negative for color change, pallor and rash.  Allergic/Immunologic: Negative.   Neurological: Negative.  Negative for dizziness, tremors, seizures, syncope, weakness, numbness and headaches.  Hematological: Negative.  Negative for adenopathy. Does not bruise/bleed easily.  Psychiatric/Behavioral: Positive for agitation, dysphoric mood and sleep disturbance. Negative for behavioral problems, confusion, hallucinations and suicidal ideas. The patient is nervous/anxious and is hyperactive.     Vitals:   12/01/16 1634  BP: (!) 144/96  Pulse: 94  Temp: 98.2 F (36.8 C)  TempSrc: Oral  SpO2: 98%  Weight: 127 lb (57.6 kg)  Height: _0  (1.6 m)   Body mass index is 22.5 kg/m. Wt Readings from Last 3 Encounters:  12/01/16 127 lb (57.6 kg)  07/01/16 121 lb (54.9 kg)  06/03/16 120 lb (54.4 kg)      Physical Exam  Constitutional: She is oriented to person, place, and time. She appears well-developed and well-nourished. No distress.  HENT:  Head: Normocephalic and atraumatic.  Right Ear: External ear normal.  Left Ear: External ear normal.  Nose: Nose normal.  Mouth/Throat: No oropharyngeal exudate.  Eyes: Conjunctivae and EOM are normal. Pupils are equal, round, and reactive to light. Right eye exhibits no discharge. Left eye exhibits no discharge.  Neck: Neck supple. No JVD present. No tracheal deviation present. No thyromegaly present.  Cardiovascular: Normal rate, regular rhythm, normal heart sounds and intact distal pulses.  Exam reveals no friction rub.   No murmur heard. Pulmonary/Chest: Breath sounds normal. No respiratory distress. She has no wheezes. She exhibits no tenderness.  Abdominal: Bowel sounds are normal. She  exhibits no distension and no mass. There is tenderness (LLQ ).  Musculoskeletal: Normal range of motion. She exhibits no edema or tenderness.  Lymphadenopathy:    She has no cervical adenopathy.  Neurological: She is alert and oriented to person, place, and time. She has normal reflexes. No cranial nerve deficit. Coordination normal.  Skin: No rash noted. No erythema. No pallor.  Scarred nodular feeling area of the right 4th finger proximal phalanx dorsum. Large black seborrheic keratosis of the left upper breast.  Psychiatric: Her behavior is normal. Judgment and thought content normal.  persistent stress related to family problems. Stressed. Talks rapidly. Does not exhibit flight of ideas or perseveration.    Labs reviewed: Lab Summary Latest Ref Rng & Units 11/30/2016 06/29/2016 05/29/2016 05/26/2016 12/13/2015 08/02/2015  Hemoglobin 13.0-17.0 g/dL (None) (None) (None) (None) (None) (None)  Hematocrit 39.0-52.0 % (None) (None) (None) (None) (None) (None)  White count - (None) (None) (None) (None) (None) (None)  Platelet count - (None) (None) (None) (None) (None) (None)  Sodium 135 - 146 mmol/L 137 140 (None) (None) 136 140  Potassium 3.5 - 5.3 mmol/L 3.9 3.6 (None) (None) 4.4 4.2  Calcium  8.6 - 10.4 mg/dL 9.0 9.3 (None) (None) 9.3 9.8  Phosphorus - (None) (None) (None) (None) (None) (None)  Creatinine 0.50 - 1.05 mg/dL 0.63 0.55 0.60 0.66 0.77 0.63  AST 10 - 35 U/L 12 (None) (None) (None) (None) (None)  Alk Phos 33 - 130 U/L 97 (None) (None) (None) (None) (None)  Bilirubin 0.2 - 1.2 mg/dL 0.3 (None) (None) (None) (None) (None)  Glucose 65 - 99 mg/dL 301(H) 193(H) (None) (None) 229(H) 237(H)  Cholesterol - (None) (None) (None) (None) (None) (None)  HDL cholesterol >39 mg/dL (None) (None) (None) (None) 41 50  Triglycerides 0 - 149 mg/dL (None) (None) (None) (None) 166(H) 116  LDL Direct - (None) (None) (None) (None) (None) (None)  LDL Calc 0 - 99 mg/dL (None) (None) (None) (None) 118(H)  140(H)  Total protein 6.1 - 8.1 g/dL 7.3 (None) (None) (None) (None) (None)  Albumin 3.6 - 5.1 g/dL 3.9 (None) (None) (None) (None) (None)  Some recent data might be hidden   No results found for: TSH, T3TOTAL, T4TOTAL, THYROIDAB Lab Results  Component Value Date   BUN 11 11/30/2016   BUN 9 06/29/2016   BUN 7 05/26/2016   Lab Results  Component Value Date   HGBA1C 11.3 (H) 11/30/2016   HGBA1C 11.0 (H) 06/29/2016   HGBA1C 13.1 (H) 12/13/2015    Assessment/Plan  1. Diabetes mellitus without complication ([place]) She says there is room to improve and she will take medications and eat properly - Hemoglobin A1c; Future - Comprehensive metabolic panel; Future  2. Essential hypertension Reduce salt in diet. - Comprehensive metabolic panel; Future  3. Depression, major, recurrent, moderate ([place]) May need antidepressant , but she wants to continue alprazolam for now  4. Generalized anxiety disorder Needs to find a way to be able to walk away from some situations. - ALPRAZolam (XANAX) 0.5 MG tablet; Take one up to twice daily for anxiety or rest  Dispense: 60 tablet; Refill: 0  5. Endometriosis - HYDROcodone-acetaminophen (NORCO/VICODIN) 5-325 MG tablet; One every 6 hours if needed for pain  Dispense: 100 tablet; Refill: 0  6. Hyperlipidemia, unspecified hyperlipidemia type - Lipid panel; Future

## 2017-01-19 ENCOUNTER — Other Ambulatory Visit: Payer: Self-pay | Admitting: Internal Medicine

## 2017-01-19 DIAGNOSIS — F419 Anxiety disorder, unspecified: Secondary | ICD-10-CM

## 2017-01-19 DIAGNOSIS — F411 Generalized anxiety disorder: Secondary | ICD-10-CM

## 2017-01-19 MED ORDER — ALPRAZOLAM 0.5 MG PO TABS: ORAL_TABLET | ORAL | 0 refills | Status: DC

## 2017-02-01 ENCOUNTER — Ambulatory Visit (INDEPENDENT_AMBULATORY_CARE_PROVIDER_SITE_OTHER): Payer: BLUE CROSS/BLUE SHIELD | Admitting: Nurse Practitioner

## 2017-02-01 ENCOUNTER — Encounter: Payer: Self-pay | Admitting: Nurse Practitioner

## 2017-02-01 VITALS — BP 136/88 | HR 63 | Temp 98.6°F | Resp 18 | Ht 63.0 in | Wt 121.4 lb

## 2017-02-01 DIAGNOSIS — R1032 Left lower quadrant pain: Secondary | ICD-10-CM

## 2017-02-01 DIAGNOSIS — W57XXXA Bitten or stung by nonvenomous insect and other nonvenomous arthropods, initial encounter: Secondary | ICD-10-CM | POA: Diagnosis not present

## 2017-02-01 LAB — CBC WITH DIFFERENTIAL/PLATELET
Basophils Absolute: 0 cells/uL (ref 0–200)
Basophils Relative: 0 %
Eosinophils Absolute: 75 cells/uL (ref 15–500)
Eosinophils Relative: 1 %
HCT: 41.9 % (ref 35.0–45.0)
Hemoglobin: 13.9 g/dL (ref 11.7–15.5)
Lymphocytes Relative: 39 %
Lymphs Abs: 2925 cells/uL (ref 850–3900)
MCH: 26.1 pg — ABNORMAL LOW (ref 27.0–33.0)
MCHC: 33.2 g/dL (ref 32.0–36.0)
MCV: 78.8 fL — ABNORMAL LOW (ref 80.0–100.0)
MPV: 8.1 fL (ref 7.5–12.5)
Monocytes Absolute: 375 cells/uL (ref 200–950)
Monocytes Relative: 5 %
Neutro Abs: 4125 cells/uL (ref 1500–7800)
Neutrophils Relative %: 55 %
Platelets: 253 10*3/uL (ref 140–400)
RBC: 5.32 MIL/uL — ABNORMAL HIGH (ref 3.80–5.10)
RDW: 14.3 % (ref 11.0–15.0)
WBC: 7.5 10*3/uL (ref 3.8–10.8)

## 2017-02-01 MED ORDER — DOXYCYCLINE HYCLATE 100 MG PO TABS: 100.0000 mg | ORAL_TABLET | Freq: Two times a day (BID) | ORAL | 0 refills | Status: DC

## 2017-02-01 NOTE — Patient Instructions (Signed)
Rx for Doxycyline 100 mg by mouth twice daily to take all prescribed Will get blood work today due to tick bite

## 2017-02-01 NOTE — Progress Notes (Signed)
Careteam: Patient Care Team: Gayland Curry, DO as PCP - General (Geriatric Medicine) Artelia Laroche, CNM as Midwife (Obstetrics and Gynecology)  Advanced Directive information Does Patient Have a Medical Advance Directive?: No  Allergies  Allergen Reactions  . Aspirin     Intolerance (aspirin sensitive stomach)  . Darvocet [Propoxyphene N-Acetaminophen]   . Erythromycin   . Ibuprofen   . Metformin And Related Nausea Only    Chief Complaint  Patient presents with  . Acute Visit    Pt is being seen due to finding tick in pubic area 1 week ago but unsure of how long tick was attached.  . Tick was removed but a red area came up. Pt reports fever and left side pain x 2 week.      HPI: Patient is a 56 y.o. female seen in the office today due to tick bite.  Found the tick 1 week ago which was the size of her tip of her pinky to her left outer labia.  Was having side pain prior to finding tick but by the size of the tick it has been there a while.  Hx of uterine fibroids and endometriosis for years but pain finally calmed down, pain reminds of her this but worse Now having pain in lower abdominal, left lower back, hip and upper leg.  No dysuria.  2 weeks ago she started having a drilling pain after she removed tick now severe sharp stabbing pain. Took hydrocodone but it did not help. Was in a ball in the shower pain was so bad. Took extra tylenol but it did not help.  Feeling feverish Body aches No rash.  Review of Systems:  Review of Systems  Constitutional: Negative for chills, fever and weight loss.  HENT: Negative for tinnitus.   Respiratory: Negative for cough, sputum production and shortness of breath.   Cardiovascular: Negative for chest pain, palpitations and leg swelling.  Gastrointestinal: Negative for abdominal pain, constipation, diarrhea and heartburn.  Genitourinary: Negative for dysuria, frequency and urgency.  Musculoskeletal: Positive for back pain, joint  pain and myalgias. Negative for falls.  Skin: Positive for itching. Negative for rash.  Neurological: Negative for dizziness and headaches.    Past Medical History:  Diagnosis Date  . Abdominal pain, left lower quadrant 12/17/2015  . Chest pain, unspecified 10/05/2003  . Depressive disorder, not elsewhere classified 07/31/2005  . Diabetes mellitus without complication ([place]) 08/12/7251  . Endometriosis 09/14/2012  . Hypertension 11/23/2003  . Unspecified constipation 06/24/2004   Past Surgical History:  Procedure Laterality Date  . APPENDECTOMY  1996   Dr. Lindon Romp  . BREAST SURGERY Bilateral 1996   Dr. Isaiah Blakes  . INGUINAL HERNIA REPAIR    . OVARIAN CYST REMOVAL     Social History:   reports that she has never smoked. She has never used smokeless tobacco. She reports that she does not drink alcohol or use drugs.  Family History  Problem Relation Age of Onset  . Diabetes Mother   . Hypertension Mother   . Arthritis Mother        RA  . Pancreatic cancer Father   . Stomach cancer Neg Hx   . Colon cancer Neg Hx     Medications: Patient's Medications  New Prescriptions   No medications on file  Previous Medications   ALPRAZOLAM (XANAX) 0.5 MG TABLET    Take one up to twice daily for anxiety or rest   CHROMIUM-CINNAMON (CINNAMON PLUS CHROMIUM PO)  Take 1,000 mg by mouth 2 (two) times daily with a meal.   DIPHENHYDRAMINE (BENADRYL) 25 MG TABLET    Take 25 mg by mouth. 2 tablets once daily as needed for allergies   FOLIC ACID (FOLVITE) 615 MCG TABLET    Take 400 mcg by mouth daily.   GLIPIZIDE (GLUCOTROL) 10 MG TABLET    One before breakfast and one before supper to control diabetes   HYDROCODONE-ACETAMINOPHEN (NORCO/VICODIN) 5-325 MG TABLET    One every 6 hours if needed for pain   MILK THISTLE EXTRACT PO    Take by mouth.   MISC NATURAL PRODUCTS (ECHINACEA GOLDENSEAL PLUS PO)    Take by mouth. Take 2 capsules once daily.   MISC NATURAL PRODUCTS (LYDIA PINKHAM/HERBAL COMPOUND  PO)    Take by mouth. Take 2 tablets once daily   MULTIPLE VITAMINS-MINERALS (ADVANCED DIABETIC MULTIVITAMIN PO)    Take by mouth. Take 1 packet once daily.   NUTRITIONAL SUPPLEMENTS (NUTRITIONAL SUPPLEMENT PO)    Take by mouth. Glucocil= 2 tablets BID   OVER THE COUNTER MEDICATION    Yacon Root take 1 teaspoon as needed to lower blood sugar   RED YEAST RICE EXTRACT (RED YEAST RICE PO)    Take by mouth. Take two tablespoons in evening for cholesterol   VITAMIN C (ASCORBIC ACID) 500 MG TABLET    Take 1,000 mg by mouth 3 (three) times daily. Take 1 tablet once daily  Modified Medications   No medications on file  Discontinued Medications   No medications on file     Physical Exam:  Vitals:   02/01/17 1550  BP: 136/88  Pulse: 63  Resp: 18  Temp: 98.6 F (37 C)  TempSrc: Oral  SpO2: 97%  Weight: 121 lb 6.4 oz (55.1 kg)  Height: 5' 3" (1.6 m)   Body mass index is 21.51 kg/m.  Physical Exam  Constitutional: She is oriented to person, place, and time. She appears well-developed and well-nourished. No distress.  HENT:  Head: Normocephalic and atraumatic.  Nose: Nose normal.  Mouth/Throat: No oropharyngeal exudate.  Eyes: Conjunctivae and EOM are normal. Pupils are equal, round, and reactive to light. Right eye exhibits no discharge. Left eye exhibits no discharge.  Neck: Neck supple. No JVD present. No tracheal deviation present. No thyromegaly present.  Cardiovascular: Normal rate, regular rhythm, normal heart sounds and intact distal pulses.  Exam reveals no friction rub.   No murmur heard. Pulmonary/Chest: Breath sounds normal. No respiratory distress. She has no wheezes. She exhibits no tenderness.  Abdominal: Bowel sounds are normal. She exhibits no distension and no mass. There is tenderness (LLQ ).  Musculoskeletal: Normal range of motion. She exhibits no edema or tenderness.       Left hip: She exhibits normal range of motion, normal strength and no tenderness.        Lumbar back: She exhibits no tenderness.  Lymphadenopathy:    She has no cervical adenopathy.  Neurological: She is alert and oriented to person, place, and time. She has normal reflexes. No cranial nerve deficit. Coordination normal.  Skin: Skin is warm and dry. No rash noted. No erythema. No pallor.  Large black seborrheic keratosis of the left upper breast.  Left outer labia-small area of induration where tick was removed- pinpoint, area pink. No rash or erythema. No pain.   Psychiatric: Her behavior is normal. Judgment and thought content normal.  persistent stress related to family problems. Stressed. Talks rapidly. Does not exhibit flight of  ideas or perseveration.    Labs reviewed: Basic Metabolic Panel:  Recent Labs  05/26/16 1311 05/29/16 1435 06/29/16 1359 11/30/16 0808  NA  --   --  140 137  K  --   --  3.6 3.9  CL  --   --  101 103  CO2  --   --  31 22  GLUCOSE  --   --  193* 301*  BUN 7  --  9 11  CREATININE 0.66 0.60 0.55 0.63  CALCIUM  --   --  9.3 9.0   Liver Function Tests:  Recent Labs  11/30/16 0808  AST 12  ALT 7  ALKPHOS 97  BILITOT 0.3  PROT 7.3  ALBUMIN 3.9   No results for input(s): LIPASE, AMYLASE in the last 8760 hours. No results for input(s): AMMONIA in the last 8760 hours. CBC: No results for input(s): WBC, NEUTROABS, HGB, HCT, MCV, PLT in the last 8760 hours. Lipid Panel: No results for input(s): CHOL, HDL, LDLCALC, TRIG, CHOLHDL, LDLDIRECT in the last 8760 hours. TSH: No results for input(s): TSH in the last 8760 hours. A1C: Lab Results  Component Value Date   HGBA1C 11.3 (H) 11/30/2016     Assessment/Plan 1. Tick bite, initial encounter -no erythema to or drainage to area tick was removed.  - CBC with Differential/Platelets - CMP with eGFR - [place] spotted fvr abs pnl(IgG+IgM) - Lyme Disease Abs IgG, IgM, IFA, CSF - doxycycline (VIBRA-TABS) 100 MG tablet; Take 1 tablet (100 mg total) by mouth 2 (two) times daily.   Dispense: 20 tablet; Refill: 0  2. Left lower quadrant pain -appears to be chronic based on review of records. Seems to be exacerbated after tick bite.  May take additional dose of hydrocodone/apap tonight and tomorrow X 2 (so 3 extra tablets total of the course of 24 hours) - CBC with Differential/Platelets - CMP with eGFR - doxycycline (VIBRA-TABS) 100 MG tablet; Take 1 tablet (100 mg total) by mouth 2 (two) times daily.  Dispense: 20 tablet; Refill: 0  Return precautions discussed  Carlos American. Harle Battiest  [facility] [phone] 8 am - 5 pm) [phone] (after hours)

## 2017-02-02 ENCOUNTER — Telehealth: Payer: Self-pay | Admitting: *Deleted

## 2017-02-02 LAB — ROCKY MTN SPOTTED FVR ABS PNL(IGG+IGM)
RMSF IgG: NOT DETECTED
RMSF IgM: NOT DETECTED

## 2017-02-02 LAB — COMPLETE METABOLIC PANEL WITH GFR
ALT: 10 U/L (ref 6–29)
AST: 13 U/L (ref 10–35)
Albumin: 4.3 g/dL (ref 3.6–5.1)
Alkaline Phosphatase: 96 U/L (ref 33–130)
BUN: 10 mg/dL (ref 7–25)
CO2: 23 mmol/L (ref 20–31)
Calcium: 9.8 mg/dL (ref 8.6–10.4)
Chloride: 100 mmol/L (ref 98–110)
Creat: 0.75 mg/dL (ref 0.50–1.05)
GFR, Est African American: >89 mL/min (ref >=60)
GFR, Est Non African American: >89 mL/min (ref >=60)
Glucose, Bld: 288 mg/dL — ABNORMAL HIGH (ref 65–99)
Potassium: 4.2 mmol/L (ref 3.5–5.3)
Sodium: 135 mmol/L (ref 135–146)
Total Bilirubin: 0.4 mg/dL (ref 0.2–1.2)
Total Protein: 8 g/dL (ref 6.1–8.1)

## 2017-02-02 NOTE — Telephone Encounter (Signed)
Brooke [name] with [facility] called and stated that they cannot order the lab ordered due to not receiving fluid but can order the Lyme with Serum. Serum test ordered.

## 2017-02-05 LAB — LYME ABY, WSTRN BLT IGG & IGM W/BANDS

## 2017-02-09 ENCOUNTER — Ambulatory Visit: Payer: Self-pay | Admitting: Nurse Practitioner

## 2017-03-02 ENCOUNTER — Other Ambulatory Visit: Payer: Self-pay

## 2017-03-04 ENCOUNTER — Ambulatory Visit: Payer: Self-pay | Admitting: Internal Medicine

## 2017-06-29 ENCOUNTER — Other Ambulatory Visit: Payer: Self-pay

## 2017-06-29 DIAGNOSIS — E119 Type 2 diabetes mellitus without complications: Secondary | ICD-10-CM

## 2017-06-29 DIAGNOSIS — E785 Hyperlipidemia, unspecified: Secondary | ICD-10-CM

## 2017-06-29 DIAGNOSIS — I10 Essential (primary) hypertension: Secondary | ICD-10-CM

## 2017-07-05 ENCOUNTER — Other Ambulatory Visit: Payer: Self-pay

## 2017-07-05 LAB — HM MAMMOGRAPHY

## 2017-07-08 ENCOUNTER — Encounter: Payer: Self-pay | Admitting: *Deleted

## 2017-07-08 ENCOUNTER — Ambulatory Visit: Payer: Self-pay | Admitting: Internal Medicine

## 2017-09-20 ENCOUNTER — Other Ambulatory Visit: Payer: Self-pay | Admitting: *Deleted

## 2017-09-20 DIAGNOSIS — E119 Type 2 diabetes mellitus without complications: Secondary | ICD-10-CM

## 2017-09-20 MED ORDER — GLIPIZIDE 10 MG PO TABS: ORAL_TABLET | ORAL | 0 refills | Status: DC

## 2017-09-20 NOTE — Telephone Encounter (Signed)
[facility] 

## 2018-06-01 ENCOUNTER — Encounter: Payer: Self-pay | Admitting: Gastroenterology

## 2018-07-04 ENCOUNTER — Telehealth: Payer: Self-pay | Admitting: *Deleted

## 2018-07-04 NOTE — Telephone Encounter (Signed)
.  left message to have patient return my call.  

## 2018-07-11 NOTE — Telephone Encounter (Signed)
.  left message to have patient return my call.  

## 2018-07-12 LAB — HM MAMMOGRAPHY

## 2018-07-20 ENCOUNTER — Encounter: Payer: Self-pay | Admitting: *Deleted

## 2018-07-26 ENCOUNTER — Ambulatory Visit: Payer: BLUE CROSS/BLUE SHIELD | Admitting: Obstetrics & Gynecology

## 2019-02-20 ENCOUNTER — Other Ambulatory Visit: Payer: Self-pay | Admitting: *Deleted

## 2019-02-20 DIAGNOSIS — Z20822 Contact with and (suspected) exposure to covid-19: Secondary | ICD-10-CM

## 2019-02-24 LAB — NOVEL CORONAVIRUS, NAA: SARS-CoV-2, NAA: NOT DETECTED

## 2020-05-06 ENCOUNTER — Other Ambulatory Visit: Payer: Self-pay

## 2020-05-06 ENCOUNTER — Encounter: Payer: Self-pay | Admitting: Nurse Practitioner

## 2020-05-06 ENCOUNTER — Ambulatory Visit (INDEPENDENT_AMBULATORY_CARE_PROVIDER_SITE_OTHER): Payer: 59 | Admitting: Nurse Practitioner

## 2020-05-06 VITALS — BP 138/88 | Ht 65.0 in | Wt 110.0 lb

## 2020-05-06 DIAGNOSIS — R102 Pelvic and perineal pain: Secondary | ICD-10-CM | POA: Diagnosis not present

## 2020-05-06 DIAGNOSIS — Z01419 Encounter for gynecological examination (general) (routine) without abnormal findings: Secondary | ICD-10-CM | POA: Diagnosis not present

## 2020-05-06 DIAGNOSIS — G8929 Other chronic pain: Secondary | ICD-10-CM | POA: Diagnosis not present

## 2020-05-06 DIAGNOSIS — Z780 Asymptomatic menopausal state: Secondary | ICD-10-CM | POA: Diagnosis not present

## 2020-05-06 DIAGNOSIS — Z13820 Encounter for screening for osteoporosis: Secondary | ICD-10-CM

## 2020-05-06 MED ORDER — TIZANIDINE HCL 2 MG PO TABS: 2.0000 mg | ORAL_TABLET | Freq: Four times a day (QID) | ORAL | 1 refills | Status: DC | PRN

## 2020-05-06 NOTE — Patient Instructions (Signed)

## 2020-05-06 NOTE — Progress Notes (Signed)
   Brooke [name] 25-Jul-1961 [id number]   History:  59 y.o. G1P1 presents as new patient to establish care. History of endometriosis with chronic pelvic pain for greater than 20 years. Has had 3 surgeries for this and has seen pelvic floor specialists with treatments to include Cymbalta, gabapentin, muscle relaxers, narcotics, and anti-inflammatories. Current regimen includes Midol and Zanaflex. She needs refill on Zanaflex. History of ovarian cysts with two cystectomies. Ultrasound 01/2019 showed uterine fibroids, unable to visualize endometrial thickness. Abnormal pap in 1988/1992, subsequent normal. Right breast numbness for 4-5 months. Normal mammogram history, screening mammogram next week. Denies nipple discharge, breast tenderness, mass, or swelling.   Gynecologic History Contraception: post menopausal status LMP 4 years ago Last Pap: 2018. Results were: normal Last mammogram: 07/16/2018. Results were: benign right breast calcification Last colonoscopy: 05/10/2015. Results were: polyps, diverticulosis Last Dexa: Never   Past medical history, past surgical history, family history and social history were all reviewed and documented in the EPIC chart.  ROS:  A ROS was performed and pertinent positives and negatives are included.  Exam:  Vitals:   05/06/20 1517  BP: 138/88  Weight: 110 lb (49.9 kg)  Height: 5\' 5"  (1.651 m)   Body mass index is 18.3 kg/m.  General appearance:  Normal Thyroid:  Symmetrical, normal in size, without palpable masses or nodularity. Respiratory  Auscultation:  Clear without wheezing or rhonchi Cardiovascular  Auscultation:  Regular rate, without rubs, murmurs or gallops  Edema/varicosities:  Not grossly evident Abdominal  Soft, tender with palpation in lower abdominal regions, without masses, guarding or rebound.  Liver/spleen:  No organomegaly noted  Hernia:  None appreciated  Skin  Inspection:  Grossly normal   Breasts: Examined lying and  sitting.   Right: Without masses, retractions, discharge or axillary adenopathy. Reports numbness at bottom center of breast.    Left: Without masses, retractions, discharge or axillary adenopathy. Gentitourinary   Inguinal/mons:  Normal without inguinal adenopathy  External genitalia:  Normal  BUS/Urethra/Skene's glands:  Normal  Vagina:  Atrophy  Cervix:  Normal  Uterus:  Normal in size, shape and contour.  Midline and mobile. Tender with palpation  Adnexa/parametria:     Rt: Without masses or tenderness.   Lt: Without masses or tenderness.  Anus and perineum: Normal  Assessment/Plan:  59 y.o. G1P1 presents to establish care.   Well female exam with routine gynecological exam - Education provided on SBEs, importance of preventative screenings, current guidelines, high calcium diet, regular exercise, and multivitamin daily. Labs elsewhere.   Chronic pelvic pain in female - Plan: tiZANidine (ZANAFLEX) 2 MG tablet every 6 hours as needed. She mostly takes at night and Midol during the day. 20+ year history, 3 surgeries for endometriosis, two cystectomies. The hope was for pain to improve after menopause. Pain is still present daily. Would like to meet with Dr. Dellis Filbert to discuss possible hysterectomy.   Screening for cervical cancer - abnormal paps >30 years ago. Most recent in 2018. Will repeat at 5 year interval.   Postmenopausal - Plan: DG Bone Density. No HRT, no bleeding.   Screening for osteoporosis - Plan: DG Bone Density  Follow up in 1 year for annual.         [name] [facility], 3:52 PM 05/06/2020

## 2020-05-07 ENCOUNTER — Telehealth: Payer: Self-pay | Admitting: *Deleted

## 2020-05-07 NOTE — Telephone Encounter (Signed)
Patient called and left message was seen yesterday for annual exam Rx sent in for Tizanidine 2 mg tablet, patient said she thought at Rx for generic Robaxin would be sent in as well? Patient said she really needs this medication. Please advise

## 2020-05-07 NOTE — Telephone Encounter (Signed)
Patient informed, patient picked up the Tizanidine she will try this and call back if no improvement.

## 2020-05-07 NOTE — Telephone Encounter (Signed)
Those are both muscle relaxors so I can only send in one of them. She said the Tizanidine worked good for her in the past so we decided on that one. If she has not picked up the Tizanidine I can discontinue and do the Robaxin if she would rather that.

## 2020-05-08 ENCOUNTER — Other Ambulatory Visit: Payer: Self-pay | Admitting: Nurse Practitioner

## 2020-05-08 DIAGNOSIS — G8929 Other chronic pain: Secondary | ICD-10-CM

## 2020-05-08 DIAGNOSIS — R102 Pelvic and perineal pain: Secondary | ICD-10-CM

## 2020-05-10 ENCOUNTER — Telehealth: Payer: Self-pay | Admitting: *Deleted

## 2020-05-10 NOTE — Telephone Encounter (Signed)
Patient had annual exam on 05/06/20 reports she mentioned some breast discomfort at annual exam, she is scheduled for her mammogram on 05/14/20 at [facility]. Patient called [facility] to inform them and was told an order for diag mammogram order is required. Okay to send order?

## 2020-05-12 NOTE — Telephone Encounter (Signed)
Yes please send. Thank you.  °

## 2020-05-13 NOTE — Telephone Encounter (Signed)
Order faxed to [facility] and patient informed.

## 2020-05-14 ENCOUNTER — Encounter: Payer: Self-pay | Admitting: Nurse Practitioner

## 2020-05-23 ENCOUNTER — Telehealth: Payer: Self-pay | Admitting: *Deleted

## 2020-05-23 ENCOUNTER — Other Ambulatory Visit: Payer: Self-pay | Admitting: Nurse Practitioner

## 2020-05-23 DIAGNOSIS — G8929 Other chronic pain: Secondary | ICD-10-CM

## 2020-05-23 MED ORDER — METHOCARBAMOL 500 MG PO TABS: 500.0000 mg | ORAL_TABLET | Freq: Four times a day (QID) | ORAL | 1 refills | Status: DC | PRN

## 2020-05-23 NOTE — Telephone Encounter (Signed)
I Dc'd Zanaflex and sent Robaxin 500 mg every 6 hours as needed to her pharmacy.

## 2020-05-23 NOTE — Telephone Encounter (Signed)
Patient called requesting a Rx on Robaxin 100 mg, report this help better than the Zanaflex 2 mg, she is going to d/c the Zanaflex. Please advise

## 2020-05-31 ENCOUNTER — Ambulatory Visit: Payer: 59 | Admitting: Obstetrics & Gynecology

## 2020-05-31 ENCOUNTER — Encounter: Payer: Self-pay | Admitting: Obstetrics & Gynecology

## 2020-05-31 ENCOUNTER — Other Ambulatory Visit: Payer: Self-pay

## 2020-05-31 VITALS — BP 136/84

## 2020-05-31 DIAGNOSIS — Z780 Asymptomatic menopausal state: Secondary | ICD-10-CM | POA: Diagnosis not present

## 2020-05-31 DIAGNOSIS — R102 Pelvic and perineal pain: Secondary | ICD-10-CM | POA: Diagnosis not present

## 2020-05-31 DIAGNOSIS — G8929 Other chronic pain: Secondary | ICD-10-CM

## 2020-05-31 DIAGNOSIS — N762 Acute vulvitis: Secondary | ICD-10-CM

## 2020-05-31 NOTE — Progress Notes (Signed)
    Brooke [name] May 12, 1961 [id number]        59 y.o.  G1P1L1   RP:  Counseling on management Chronic Pelvic Pain  HPI: Seen by Marny Lowenstein for Annual/Gyn exam 05/06/2020: "History of endometriosis with chronic pelvic pain for greater than 20 years. Has had 3 surgeries for this and has seen pelvic floor specialists in [facility] with treatments to include Cymbalta, gabapentin, muscle relaxers, narcotics, and anti-inflammatories. Current regimen includes Midol and Zanaflex. History of ovarian cysts with two cystectomies. Ultrasound 01/2019 showed uterine fibroids, unable to visualize endometrial thickness. Abnormal pap in 1988/1992, subsequent normal." Postmenopausal x many years, on no HRT.  No PMB.  Colonoscopy showed Polyps and Diverticulosis in 04/2015.   OB History  No obstetric history on file.    Past medical history,surgical history, problem list, medications, allergies, family history and social history were all reviewed and documented in the EPIC chart.   Directed ROS with pertinent positives and negatives documented in the history of present illness/assessment and plan.  Exam:  Vitals:   05/31/20 1420  BP: 136/84   General appearance:  Normal  Abdomen: Soft, non-distended  Gynecologic exam: Vulvitis Rt>Lt.  Hemorrhoid/Rectal prolapse.  Bimanual exam:  Uterus AV, normal volume, mobile, mildly tender.  Adnexa no mass/NT bilaterally.   Assessment/Plan:  59 y.o.   1. Chronic pelvic pain in female Refer to PT for Pelvic floor Dysfunction.  F/U Pelvic US. - US Transvaginal Non-OB; Future  2. Postmenopausal Well on no HRT.  3. Acute vulvitis Hydrocortisone 1% cream on vulva daily x 2 weeks.  Avoid irritants.  [name] MD, 2:36 PM 05/31/2020

## 2020-06-02 ENCOUNTER — Encounter: Payer: Self-pay | Admitting: Obstetrics & Gynecology

## 2020-06-03 ENCOUNTER — Telehealth: Payer: Self-pay | Admitting: *Deleted

## 2020-06-03 DIAGNOSIS — M6289 Other specified disorders of muscle: Secondary | ICD-10-CM

## 2020-06-03 NOTE — Telephone Encounter (Signed)
-----   Message from [name], MD sent at 05/31/2020  3:15 PM EDT ----- Regarding: Refer to Physical Therapy Chronic Pelvic Pain.  Probable Pelvic Floor Dysfunction.

## 2020-06-03 NOTE — Telephone Encounter (Signed)
Referral placed at [facility] physical therapy they will call to schedule.

## 2020-06-04 ENCOUNTER — Encounter: Payer: Self-pay | Admitting: Nurse Practitioner

## 2020-06-20 ENCOUNTER — Other Ambulatory Visit: Payer: 59

## 2020-06-20 ENCOUNTER — Ambulatory Visit: Payer: 59 | Admitting: Obstetrics & Gynecology

## 2020-06-27 ENCOUNTER — Ambulatory Visit: Payer: 59 | Admitting: Obstetrics & Gynecology

## 2020-06-27 ENCOUNTER — Other Ambulatory Visit: Payer: 59

## 2020-06-28 ENCOUNTER — Ambulatory Visit (INDEPENDENT_AMBULATORY_CARE_PROVIDER_SITE_OTHER): Payer: 59 | Admitting: Obstetrics & Gynecology

## 2020-06-28 ENCOUNTER — Ambulatory Visit (INDEPENDENT_AMBULATORY_CARE_PROVIDER_SITE_OTHER): Payer: 59

## 2020-06-28 ENCOUNTER — Encounter: Payer: Self-pay | Admitting: Obstetrics & Gynecology

## 2020-06-28 ENCOUNTER — Other Ambulatory Visit: Payer: Self-pay

## 2020-06-28 VITALS — BP 140/86

## 2020-06-28 DIAGNOSIS — F419 Anxiety disorder, unspecified: Secondary | ICD-10-CM | POA: Diagnosis not present

## 2020-06-28 DIAGNOSIS — R102 Pelvic and perineal pain unspecified side: Secondary | ICD-10-CM

## 2020-06-28 DIAGNOSIS — N854 Malposition of uterus: Secondary | ICD-10-CM | POA: Diagnosis not present

## 2020-06-28 DIAGNOSIS — D251 Intramural leiomyoma of uterus: Secondary | ICD-10-CM | POA: Diagnosis not present

## 2020-06-28 DIAGNOSIS — G8929 Other chronic pain: Secondary | ICD-10-CM

## 2020-06-28 DIAGNOSIS — M6289 Other specified disorders of muscle: Secondary | ICD-10-CM

## 2020-06-28 DIAGNOSIS — D219 Benign neoplasm of connective and other soft tissue, unspecified: Secondary | ICD-10-CM

## 2020-06-28 MED ORDER — ALPRAZOLAM 0.25 MG PO TABS: 0.2500 mg | ORAL_TABLET | Freq: Every day | ORAL | 0 refills | Status: DC | PRN

## 2020-06-28 NOTE — Progress Notes (Signed)
    Brooke [name] February 28, 1961 [id number]        59 y.o. G1P1L1   RP:  Pelvic US for Chronic Pelvic Pain/Fibroids  HPI: At last visit 05/31/2020 we noted: "History of endometriosis with chronic pelvic painfor greater than 20 years. Has had 3 surgeries for this and hasseen pelvic floor specialists in [facility] with treatments to include Cymbalta, gabapentin, muscle relaxers, narcotics, and anti-inflammatories. Current regimen includes Midol and Zanaflex.History of ovarian cysts with two cystectomies. Ultrasound 01/2019 showed uterine fibroids, unable to visualize endometrial thickness. Abnormal pap in 1988/1992, subsequent normal." Postmenopausal x many years, on no HRT.  No PMB.  Colonoscopy showed Polyps and Diverticulosis in 04/2015.   OB History  No obstetric history on file.    Past medical history,surgical history, problem list, medications, allergies, family history and social history were all reviewed and documented in the EPIC chart.   Directed ROS with pertinent positives and negatives documented in the history of present illness/assessment and plan.  Exam:  Vitals:   06/28/20 1354  BP: 140/86   General appearance:  Normal  Pelvic US today: T/V images.  Retroverted uterus with several small intramural fibroids, the largest measured at 1.9 x 1.8 cm.  The overall uterine size is measured at 6.86 x 4.49 x 2.91 cm.  The endometrial lining is thin and symmetrical measured at 2.04 mm.  No thickening or mass seen.  Both ovaries are mobile, small with atrophic appearance.  No adnexal mass seen.  No free fluid in the posterior cul-de-sac.   Assessment/Plan:  59 y.o. No obstetric history on file.   1. Chronic pelvic pain in female Chronic pelvic pain probably associated with pelvic floor dysfunction.  Rule out intestinal origin.  Probably not of gynecologic origin.  Pelvic ultrasound showing very small fibroids and normal ovaries bilaterally.  Patient reassured.  2.  Fibroids Very small intramural fibroids the largest of which is at 1.9 cm.  Will observe.  3. Pelvic floor dysfunction Recommend physical therapy.  4. Anxiety Xanax 0.25 mg daily as needed for anxiety prescribed.  Recommend physical activities with endorphin release to lower patient's anxiety level.  Other orders - ALPRAZolam (XANAX) 0.25 MG tablet; Take 1 tablet (0.25 mg total) by mouth daily as needed for anxiety.  Brooke Bruins MD, 2:08 PM 06/28/2020

## 2020-07-01 NOTE — Telephone Encounter (Signed)
Patient scheduled on 08/15/20

## 2020-07-03 ENCOUNTER — Encounter: Payer: Self-pay | Admitting: Obstetrics & Gynecology

## 2020-08-08 ENCOUNTER — Other Ambulatory Visit: Payer: Self-pay | Admitting: Nurse Practitioner

## 2020-08-08 DIAGNOSIS — G8929 Other chronic pain: Secondary | ICD-10-CM

## 2020-08-08 DIAGNOSIS — R102 Pelvic and perineal pain: Secondary | ICD-10-CM

## 2020-08-15 ENCOUNTER — Other Ambulatory Visit: Payer: Self-pay

## 2020-08-15 ENCOUNTER — Ambulatory Visit: Payer: 59 | Attending: Obstetrics & Gynecology | Admitting: Physical Therapy

## 2020-08-15 ENCOUNTER — Encounter: Payer: Self-pay | Admitting: Physical Therapy

## 2020-08-15 DIAGNOSIS — M6281 Muscle weakness (generalized): Secondary | ICD-10-CM | POA: Insufficient documentation

## 2020-08-15 DIAGNOSIS — R293 Abnormal posture: Secondary | ICD-10-CM | POA: Insufficient documentation

## 2020-08-15 NOTE — Patient Instructions (Addendum)
Moisturizers . They are used in the vagina to hydrate the mucous membrane that make up the vaginal canal. . Designed to keep a more normal acid balance (ph) . Once placed in the vagina, it will last between two to three days.  . Use 2-3 times per week at bedtime  . Ingredients to avoid is glycerin and fragrance, can increase chance of infection . Should not be used just before sex due to causing irritation . Most are gels administered either in a tampon-shaped applicator or as a vaginal suppository. They are non-hormonal.   Types of Moisturizers(internal use)  . Vitamin E vaginal suppositories- Whole foods, Amazon . Moist Again . Coconut oil- can break down condoms . Julva- (Do no use if on Tamoxifen) amazon . Yes moisturizer- amazon . NeuEve Silk , NeuEve Silver for menopausal or over 65 (if have severe vaginal atrophy or cancer treatments use NeuEve Silk for  1 month than move to NeuEve Silver)- Amazon, Neuve.com . Olive and Bee intimate cream- www.oliveandbee.com.au . Mae vaginal moisturizer- Amazon . Aloe .    Creams to use externally on the Vulva area  Desert Harvest Releveum (good for for cancer patients that had radiation to the area)- amazon or www.desertharvest.com  V-magic cream - amazon  Julva-amazon  Vital "V Wild Yam salve ( help moisturize and help with thinning vulvar area, does have Beeswax  MoodMaid Botanical Pro-Meno Wild Yam Cream- Amazon  Desert Harvest Gele  Cleo by Damiva labial moisturizer (Amazon,   Coconut or olive oil  aloe   Things to avoid in the vaginal area . Do not use things to irritate the vulvar area . No lotions just specialized creams for the vulva area- Neogyn, V-magic, No soaps; can use Aveeno or Calendula cleanser if needed. Must be gentle . No deodorants . No douches . Good to sleep without underwear to let the vaginal area to air out . No scrubbing: spread the lips to let warm water rinse over labias and pat dry  [facility] [address], [address] [place], [place] [address] Phone # [phone] Fax [phone]  

## 2020-08-16 NOTE — Therapy (Addendum)
[facility] [address] [place], [place], [address] Phone: [phone]   Fax:  [phone]  Physical Therapy Evaluation  Patient Details  Name: Brooke [name] MRN: [id number] Date of Birth: [birth year] Referring Provider (PT): [name], MD   Encounter Date: 08/15/2020   PT End of Session - 08/16/20 1237    Visit Number 1    Date for PT Re-Evaluation 11/07/20    PT Start Time 1016    PT Stop Time 1058    PT Time Calculation (min) 42 min    Activity Tolerance Patient tolerated treatment well    Behavior During Therapy [facility] for tasks assessed/performed           Past Medical History:  Diagnosis Date  . Abdominal pain, left lower quadrant 12/17/2015  . Chest pain, unspecified 10/05/2003  . Depressive disorder, not elsewhere classified 07/31/2005  . Diabetes mellitus without complication ([place]) 76/73/4193  . Endometriosis 09/14/2012  . Hypertension 11/23/2003  . Unspecified constipation 06/24/2004    Past Surgical History:  Procedure Laterality Date  . APPENDECTOMY  1996   Dr. Lindon Romp  . BREAST SURGERY Bilateral 1996   Dr. Isaiah Blakes  . INGUINAL HERNIA REPAIR    . OVARIAN CYST REMOVAL      There were no vitals filed for this visit.    Subjective Assessment - 08/15/20 1021    Subjective Pt has complex history with pain. States her Lt leg anterior thigh has pain and very sensative to light touch like clothes.  Pt states    Pertinent History hx; ovarian cyst, inguinal hernia/repair; appendectomy, endo, LLQ pain, diverticulitis, low back pain, anxiety    Currently in Pain? Yes    Pain Score 8     Pain Location Abdomen    Pain Orientation Right;Left   starts more on the left   Pain Descriptors / Indicators Aching    Pain Type Chronic pain    Pain Radiating Towards down left leg and back    Pain Onset More than a month ago    Pain Frequency Constant    Aggravating Factors  nothing in particular     Pain Relieving Factors muscle relaxer help sometimes    Multiple Pain Sites No              [facility] PT Assessment - 08/29/20 0001      Assessment   Medical Diagnosis M62.89 (ICD-10-CM) - Pelvic floor dysfunction    Referring Provider (PT) [name], MD    Onset Date/Surgical Date --   2018   Prior Therapy No      Precautions   Precautions None      Balance Screen   Has the patient fallen in the past 6 months No      [place] residence    Living Arrangements Spouse/significant other;Children   2 dogs     Prior Function   Level of Independence Independent    Leisure walking when she can      Cognition   Overall Cognitive Status Within Functional Limits for tasks assessed      Posture/Postural Control   Posture/Postural Control Postural limitations    Postural Limitations Flexed trunk;Right pelvic obliquity   trunk rotation to the Lt at mid thoracic     ROM / Strength   AROM / PROM / Strength Strength;PROM;AROM      AROM   Overall AROM Comments Lt lumbar Sidebend 50%  PROM   Overall PROM Comments hip ER 80%      Strength   Overall Strength Comments LE grossly 4/5; core weakness noted with low tone even when engaging the muscles      Flexibility   Soft Tissue Assessment /Muscle Length yes    Hamstrings 75%      Palpation   SI assessment  Rt ilium anteriorly rotated    Palpation comment tight glutes, hamstrings, lumbar; fascial restriction and TTP throughout abdomen      Ambulation/Gait   Gait Pattern Decreased stride length;Trunk flexed                      Objective measurements completed on examination: See above findings.     Pelvic Floor Special Questions - 08/29/20 0001    Prior Pregnancies Yes    Number of Pregnancies 1    Number of Vaginal Deliveries 1    Any difficulty with labor and deliveries --   10lb   Currently Sexually Active No    Marinoff Scale pain prevents any attempts at  intercourse    Urinary Leakage Yes    How often 1/ day    Pad use 1 depend    Activities that cause leaking Other   anything, leaks any time   Urinary urgency Yes    Urinary frequency normal    Fecal incontinence No   not for the last 3 months   Skin Integrity --   dryness   Pelvic Floor Internal Exam pt identity confirmed and informed consent given to perform internal assessment    Exam Type Vaginal    Strength weak squeeze, no lift    Strength # of seconds 3    Tone high                    PT Education - 08/29/20 0833    Education Details moisturizing    Person(s) Educated Patient    Methods Explanation;Demonstration;Handout    Comprehension Verbalized understanding            PT Short Term Goals - 08/29/20 0827      PT SHORT TERM GOAL #1   Title report 25% reduction of pain    Time 4    Period Weeks    Status New    Target Date 09/12/20      PT SHORT TERM GOAL #2   Title ind with initial HEP    Time 4    Period Weeks    Status New    Target Date 09/12/20             PT Long Term Goals - 08/15/20 1032      PT LONG TERM GOAL #1   Title Pt will be able to start walking again for exercise for at least 1/2 mile    Baseline can now walk about 2 blocks    Time 12    Period Weeks    Status New    Target Date 11/07/20      PT LONG TERM GOAL #2   Title Pt will be able to sit or stand for at least 45-60    Baseline 15 minutes at a time    Time 12    Period Weeks    Status New    Target Date 11/07/20      PT LONG TERM GOAL #3   Title ind with advanced HEP to continue progression of strength and endurance  Time 12    Period Weeks    Status New    Target Date 11/07/20      PT LONG TERM GOAL #4   Title Pt will report at least 50% decreased pain and numbness due to improved pain management strategies and posutre    Time 12    Period Weeks    Status New    Target Date 11/07/20                  Plan - 08/29/20 1941    Clinical  Impression Statement Pt presenting to skilled PT due to chronic pain and pelvic floor dysfunction.  Gross assessment of strength done due to extra time needed because of complex medical history.  Pt has posture abnormalities with tension along posterior kinetic chain as mentioned above and generally weak in the core and high tone pelvic floor with weakness of MMT 2/5 only holding for 3 seconds. She also has notably weak abdominal wall and hard time activating deep core muscles.  Pt has been having pain and difficulty walking as well as leakage throughout the day. Pt will benefit from skilled PT to address impairments and restore her to greater function and ability to manage pain.    Personal Factors and Comorbidities Comorbidity 3+    Comorbidities hx; ovarian cyst, inguinal hernia/repair; appendectomy, endo, LLQ pain, diverticulitis, low back pain, anxiety    Examination-Activity Limitations Sit;Sleep;Lift;Bend;Carry    Examination-Participation Restrictions Community Activity    Stability/Clinical Decision Making Evolving/Moderate complexity    Clinical Decision Making Moderate    Rehab Potential Good    PT Frequency 1x / week    PT Duration 12 weeks    PT Treatment/Interventions ADLs/Self Care Home Management;Biofeedback;Cryotherapy;Electrical Stimulation;Moist Heat;Traction;Therapeutic activities;Therapeutic exercise;Neuromuscular re-education;Patient/family education;Manual techniques;Taping;Passive range of motion;Dry needling    PT Next Visit Plan f/u on moisturizers; abdominal fascial release, correct pelvic rotation; basic core ex's    PT Home Exercise Plan gave moisturizer list    Consulted and Agree with Plan of Care Patient           Patient will benefit from skilled therapeutic intervention in order to improve the following deficits and impairments:  Abnormal gait,Decreased strength,Increased fascial restricitons,Pain,Postural dysfunction,Impaired flexibility,Decreased range of  motion,Increased muscle spasms,Difficulty walking  Visit Diagnosis: Muscle weakness (generalized)  Abnormal posture     Problem List Patient Active Problem List   Diagnosis Date Noted  . Chronic pelvic pain in female 05/06/2020  . Anxiety 07/01/2016  . Wrist pain 06/24/2016  . Back pain 06/03/2016  . Abdominal pain, left lower quadrant 12/17/2015  . History of colonic polyps 08/07/2015  . Diverticulitis 05/28/2015  . Seborrheic keratoses 01/30/2015  . Hyperlipidemia 01/30/2015  . Generalized anxiety disorder 07/04/2014  . Diabetes mellitus without complication ([place]) 74/03/1447  . Depression, major, recurrent, moderate ([place]) 07/31/2005  . Hypertension 11/23/2003  . Endometriosis 03/28/1996    Jule Ser, PT 08/29/2020, 8:46 AM  [facility] [address], [place], [place], [address] Phone: [phone]   Fax:  [phone]  Name: Kikuye Korenek MRN: [id number] Date of Birth: 1961-02-04

## 2020-08-29 NOTE — Addendum Note (Signed)
Addended by: Su Hoff on: 08/29/2020 08:47 AM   Modules accepted: Orders

## 2020-09-05 ENCOUNTER — Other Ambulatory Visit: Payer: Self-pay

## 2020-09-05 ENCOUNTER — Ambulatory Visit: Payer: 59 | Admitting: Physical Therapy

## 2020-09-05 ENCOUNTER — Encounter: Payer: Self-pay | Admitting: Physical Therapy

## 2020-09-05 DIAGNOSIS — M6281 Muscle weakness (generalized): Secondary | ICD-10-CM

## 2020-09-05 DIAGNOSIS — R293 Abnormal posture: Secondary | ICD-10-CM

## 2020-09-05 NOTE — Patient Instructions (Signed)
Access Code: [email or web address] URL: [email or web address] Date: 09/05/2020 Prepared by: Jari Favre  Exercises Supine Heel Slide - 1 x daily - 7 x weekly - 3 sets - 10 reps Supine Single Knee to Chest Stretch - 2 x daily - 7 x weekly - 1 sets - 5 reps - 10 sec hold

## 2020-09-05 NOTE — Therapy (Signed)
[facility] [address] [place], [place], [address] Phone: [phone]   Fax:  [phone]  Physical Therapy Treatment  Patient Details  Name: Brooke [name] MRN: [id number] Date of Birth: [birth year] Referring Provider (PT): [name], MD   Encounter Date: 09/05/2020   PT End of Session - 09/05/20 1411    Visit Number 2    Date for PT Re-Evaluation 11/07/20    PT Start Time 1157    PT Stop Time 1443    PT Time Calculation (min) 40 min    Activity Tolerance Patient tolerated treatment well    Behavior During Therapy [facility] for tasks assessed/performed           Past Medical History:  Diagnosis Date  . Abdominal pain, left lower quadrant 12/17/2015  . Chest pain, unspecified 10/05/2003  . Depressive disorder, not elsewhere classified 07/31/2005  . Diabetes mellitus without complication ([place]) 26/20/3559  . Endometriosis 09/14/2012  . Hypertension 11/23/2003  . Unspecified constipation 06/24/2004    Past Surgical History:  Procedure Laterality Date  . APPENDECTOMY  1996   Dr. Lindon Romp  . BREAST SURGERY Bilateral 1996   Dr. Isaiah Blakes  . INGUINAL HERNIA REPAIR    . OVARIAN CYST REMOVAL      There were no vitals filed for this visit.   Subjective Assessment - 09/05/20 1407    Subjective I had to take 2 muscle relaxers yesterday because it was so much pain.  Today it is 10+.  Pain around the pelvis and down the side of the legs    Pertinent History hx; ovarian cyst, inguinal hernia/repair; appendectomy, endo, LLQ pain, diverticulitis, low back pain, anxiety    Patient Stated Goals reduce pain and be able to be more active    Currently in Pain? Yes    Pain Score 10-Worst pain ever    Pain Location Generalized    Pain Orientation Right;Left;Lower    Pain Descriptors / Indicators Cramping;Pressure    Pain Type Chronic pain    Pain Radiating Towards down the legs and around the back andpelvis    Pain  Onset More than a month ago    Pain Frequency Intermittent    Aggravating Factors  sinus cold brought it on    Pain Relieving Factors muscle relaxer    Multiple Pain Sites No                             [facility] Adult PT Treatment/Exercise - 09/05/20 0001      Neuro Re-ed    Neuro Re-ed Details  educated on how to correctly engage the core with heel slides      Exercises   Exercises Lumbar      Lumbar Exercises: Stretches   Single Knee to Chest Stretch Right;Left;3 reps      Lumbar Exercises: Supine   Heel Slides 10 reps      Manual Therapy   Manual Therapy Myofascial release    Myofascial Release liver, colon, rectum fascial releases in abdomen                  PT Education - 09/05/20 1446    Education Details Access Code: [email or web address]    Person(s) Educated Patient    Methods Explanation;Demonstration;Handout;Verbal cues;Tactile cues    Comprehension Verbalized understanding;Returned demonstration            PT Short Term Goals - 08/29/20 3646  PT SHORT TERM GOAL #1   Title report 25% reduction of pain    Time 4    Period Weeks    Status New    Target Date 09/12/20      PT SHORT TERM GOAL #2   Title ind with initial HEP    Time 4    Period Weeks    Status New    Target Date 09/12/20             PT Long Term Goals - 08/15/20 1032      PT LONG TERM GOAL #1   Title Pt will be able to start walking again for exercise for at least 1/2 mile    Baseline can now walk about 2 blocks    Time 12    Period Weeks    Status New    Target Date 11/07/20      PT LONG TERM GOAL #2   Title Pt will be able to sit or stand for at least 45-60    Baseline 15 minutes at a time    Time 12    Period Weeks    Status New    Target Date 11/07/20      PT LONG TERM GOAL #3   Title ind with advanced HEP to continue progression of strength and endurance    Time 12    Period Weeks    Status New    Target Date 11/07/20      PT LONG TERM GOAL #4    Title Pt will report at least 50% decreased pain and numbness due to improved pain management strategies and posutre    Time 12    Period Weeks    Status New    Target Date 11/07/20                 Plan - 09/05/20 1553    Clinical Impression Statement Pt states she has been doing breathing exercises at home.  She has not tried using moisturizers yet.  Pt responded well to abdominal fascial release to ascending colon and cecum.  Pt was educated in basic core strength with lumbar stretch to improve posture.  Pt at first follow up since eval and no goals met yet.  Pt will benefit from skilled PT to continue with plan of care and address multiple impairments    PT Treatment/Interventions ADLs/Self Care Home Management;Biofeedback;Cryotherapy;Electrical Stimulation;Moist Heat;Traction;Therapeutic activities;Therapeutic exercise;Neuromuscular re-education;Patient/family education;Manual techniques;Taping;Passive range of motion;Dry needling    PT Next Visit Plan f/u on moisturizers and basic core ex's; intial pelvic floor ex with core; lumbar and hip stretches    PT Home Exercise Plan gave moisturizer list; Access Code: [email or web address]    Consulted and Agree with Plan of Care Patient           Patient will benefit from skilled therapeutic intervention in order to improve the following deficits and impairments:  Abnormal gait,Decreased strength,Increased fascial restricitons,Pain,Postural dysfunction,Impaired flexibility,Decreased range of motion,Increased muscle spasms,Difficulty walking  Visit Diagnosis: Muscle weakness (generalized)  Abnormal posture     Problem List Patient Active Problem List   Diagnosis Date Noted  . Chronic pelvic pain in female 05/06/2020  . Anxiety 07/01/2016  . Wrist pain 06/24/2016  . Back pain 06/03/2016  . Abdominal pain, left lower quadrant 12/17/2015  . History of colonic polyps 08/07/2015  . Diverticulitis 05/28/2015  . Seborrheic keratoses  01/30/2015  . Hyperlipidemia 01/30/2015  . Generalized anxiety disorder 07/04/2014  . Diabetes mellitus without complication ([place])  09/08/2006  . Depression, major, recurrent, moderate ([place]) 07/31/2005  . Hypertension 11/23/2003  . Endometriosis 03/28/1996    Jule Ser, PT 09/05/2020, 5:11 PM  [facility] [address] [place], [place], [address] Phone: [phone]   Fax:  [phone]  Name: Ashantee Deupree MRN: [id number] Date of Birth: 01/15/1961

## 2020-09-26 ENCOUNTER — Other Ambulatory Visit: Payer: Self-pay

## 2020-09-26 ENCOUNTER — Encounter: Payer: Self-pay | Admitting: Physical Therapy

## 2020-09-26 ENCOUNTER — Ambulatory Visit: Payer: 59 | Attending: Obstetrics & Gynecology | Admitting: Physical Therapy

## 2020-09-26 DIAGNOSIS — R293 Abnormal posture: Secondary | ICD-10-CM

## 2020-09-26 DIAGNOSIS — M6281 Muscle weakness (generalized): Secondary | ICD-10-CM

## 2020-09-26 NOTE — Therapy (Signed)
[facility] [address], [place], [place], [address] Phone: [phone]   Fax:  [phone]  Physical Therapy Treatment  Patient Details  Name: Brooke [name] MRN: [id number] Date of Birth: Apr 24, 1961 Referring Provider (PT): [name], MD   Encounter Date: 09/26/2020   PT End of Session - 09/26/20 1523    Visit Number 3    Date for PT Re-Evaluation 11/07/20    PT Start Time 2979    PT Stop Time 1530    PT Time Calculation (min) 43 min    Activity Tolerance Patient tolerated treatment well    Behavior During Therapy [facility] for tasks assessed/performed           Past Medical History:  Diagnosis Date  . Abdominal pain, left lower quadrant 12/17/2015  . Chest pain, unspecified 10/05/2003  . Depressive disorder, not elsewhere classified 07/31/2005  . Diabetes mellitus without complication ([place]) 89/21/1941  . Endometriosis 09/14/2012  . Hypertension 11/23/2003  . Unspecified constipation 06/24/2004    Past Surgical History:  Procedure Laterality Date  . APPENDECTOMY  1996   Dr. Lindon Romp  . BREAST SURGERY Bilateral 1996   Dr. Isaiah Blakes  . INGUINAL HERNIA REPAIR    . OVARIAN CYST REMOVAL      There were no vitals filed for this visit.   Subjective Assessment - 09/26/20 1451    Subjective I was up all night last week after getting a muscle spasm when I was doing the breathing and exhaling.  The pain is Lt upper abdomen and flank region.  It is pulsating in the lower abdomen. This has been constant since Monday.    Pertinent History hx; ovarian cyst, inguinal hernia/repair; appendectomy, endo, LLQ pain, diverticulitis, low back pain, anxiety    Patient Stated Goals reduce pain and be able to be more active    Currently in Pain? Yes    Pain Score 8     Pain Location Abdomen    Pain Orientation Left;Lateral;Lower    Pain Descriptors / Indicators Cramping;Spasm;Tightness    Pain Type Chronic pain    Pain  Onset More than a month ago    Multiple Pain Sites No                             [facility] Adult PT Treatment/Exercise - 09/26/20 0001      Lumbar Exercises: Stretches   Other Lumbar Stretch Exercise side bend in standing and      Lumbar Exercises: Standing   Other Standing Lumbar Exercises standing lean on table pillows then pillow and ball to support - no change with either position and cues to relax shoulders when breathing - lumbar paraspinals tighter on Lt side      Modalities   Modalities Electrical Stimulation      Electrical Stimulation   Electrical Stimulation Location left flank    Electrical Stimulation Action IFC    Electrical Stimulation Parameters 10 min 14 mA to tolerance without muscle fasciculation    Electrical Stimulation Goals Pain      Manual Therapy   Myofascial Release stomach and left side; Lt rectus abdominus                    PT Short Term Goals - 08/29/20 0827      PT SHORT TERM GOAL #1   Title report 25% reduction of pain    Time 4    Period  Weeks    Status New    Target Date 09/12/20      PT SHORT TERM GOAL #2   Title ind with initial HEP    Time 4    Period Weeks    Status New    Target Date 09/12/20             PT Long Term Goals - 08/15/20 1032      PT LONG TERM GOAL #1   Title Pt will be able to start walking again for exercise for at least 1/2 mile    Baseline can now walk about 2 blocks    Time 12    Period Weeks    Status New    Target Date 11/07/20      PT LONG TERM GOAL #2   Title Pt will be able to sit or stand for at least 45-60    Baseline 15 minutes at a time    Time 12    Period Weeks    Status New    Target Date 11/07/20      PT LONG TERM GOAL #3   Title ind with advanced HEP to continue progression of strength and endurance    Time 12    Period Weeks    Status New    Target Date 11/07/20      PT LONG TERM GOAL #4   Title Pt will report at least 50% decreased pain and  numbness due to improved pain management strategies and posutre    Time 12    Period Weeks    Status New    Target Date 11/07/20                 Plan - 09/26/20 1524    Clinical Impression Statement PT attempted to find positions for patient where she had any change in pain.  Pt lying on pillows or face up or doing side bending stretch causes no change in symptoms today.  Pt seems tohave less muscle tension in supine but reports no change in pain.  Pt had good fascial release in left abdominal wall and left side of rectus abdominus.  Attempted estim for pain management today since patient has this at home if it seems to work.  Pt was having a hard time feeling the stim but no muscle twitches were elicited and no increased pain.    PT Treatment/Interventions ADLs/Self Care Home Management;Biofeedback;Cryotherapy;Electrical Stimulation;Moist Heat;Traction;Therapeutic activities;Therapeutic exercise;Neuromuscular re-education;Patient/family education;Manual techniques;Taping;Passive range of motion;Dry needling    PT Next Visit Plan f/u on muscle spasms; basic core ex's; intial pelvic floor ex with core; lumbar and hip stretches    PT Home Exercise Plan gave moisturizer list; Access Code: [email or web address]    Consulted and Agree with Plan of Care Patient           Patient will benefit from skilled therapeutic intervention in order to improve the following deficits and impairments:  Abnormal gait,Decreased strength,Increased fascial restricitons,Pain,Postural dysfunction,Impaired flexibility,Decreased range of motion,Increased muscle spasms,Difficulty walking  Visit Diagnosis: Muscle weakness (generalized)  Abnormal posture     Problem List Patient Active Problem List   Diagnosis Date Noted  . Chronic pelvic pain in female 05/06/2020  . Anxiety 07/01/2016  . Wrist pain 06/24/2016  . Back pain 06/03/2016  . Abdominal pain, left lower quadrant 12/17/2015  . History of colonic polyps  08/07/2015  . Diverticulitis 05/28/2015  . Seborrheic keratoses 01/30/2015  . Hyperlipidemia 01/30/2015  . Generalized anxiety disorder 07/04/2014  .  Diabetes mellitus without complication ([place]) 02/54/2706  . Depression, major, recurrent, moderate ([place]) 07/31/2005  . Hypertension 11/23/2003  . Endometriosis 03/28/1996    Jule Ser, PT 09/26/2020, 4:05 PM  [facility] [address], [place], [place], [address] Phone: [phone]   Fax:  [phone]  Name: Brooke [name] MRN: [id number] Date of Birth: 19-Feb-1961

## 2020-10-03 ENCOUNTER — Encounter: Payer: Self-pay | Admitting: Physical Therapy

## 2020-10-10 ENCOUNTER — Other Ambulatory Visit: Payer: Self-pay

## 2020-10-10 ENCOUNTER — Encounter: Payer: Self-pay | Admitting: Physical Therapy

## 2020-10-10 ENCOUNTER — Ambulatory Visit: Payer: 59 | Attending: Obstetrics & Gynecology | Admitting: Physical Therapy

## 2020-10-10 ENCOUNTER — Other Ambulatory Visit: Payer: Self-pay | Admitting: Nurse Practitioner

## 2020-10-10 DIAGNOSIS — G8929 Other chronic pain: Secondary | ICD-10-CM

## 2020-10-10 DIAGNOSIS — R293 Abnormal posture: Secondary | ICD-10-CM | POA: Insufficient documentation

## 2020-10-10 DIAGNOSIS — M6281 Muscle weakness (generalized): Secondary | ICD-10-CM | POA: Insufficient documentation

## 2020-10-10 NOTE — Therapy (Signed)
[facility] [address], [place], [place], [address] Phone: [phone]   Fax:  [phone]  Physical Therapy Treatment  Patient Details  Name: Brooke [name] MRN: [id number] Date of Birth: 05/18/1961 Referring Provider (PT): [name], MD   Encounter Date: 10/10/2020   PT End of Session - 10/10/20 1541    Visit Number 4    Date for PT Re-Evaluation 11/07/20    PT Start Time 7494    PT Stop Time 1615    PT Time Calculation (min) 40 min    Activity Tolerance Patient tolerated treatment well    Behavior During Therapy [facility] for tasks assessed/performed           Past Medical History:  Diagnosis Date  . Abdominal pain, left lower quadrant 12/17/2015  . Chest pain, unspecified 10/05/2003  . Depressive disorder, not elsewhere classified 07/31/2005  . Diabetes mellitus without complication ([place]) 49/67/5916  . Endometriosis 09/14/2012  . Hypertension 11/23/2003  . Unspecified constipation 06/24/2004    Past Surgical History:  Procedure Laterality Date  . APPENDECTOMY  1996   Dr. Lindon Romp  . BREAST SURGERY Bilateral 1996   Dr. Isaiah Blakes  . INGUINAL HERNIA REPAIR    . OVARIAN CYST REMOVAL      There were no vitals filed for this visit.   Subjective Assessment - 10/10/20 1536    Subjective I had a fall from missing the chair on Sunday.  I fell on the Rt side and pain from knee up to my head.    Patient Stated Goals reduce pain and be able to be more active    Currently in Pain? Yes    Pain Location Generalized    Pain Orientation Right    Pain Descriptors / Indicators Aching    Pain Radiating Towards knee to head    Pain Onset 1 to 4 weeks ago    Pain Frequency Intermittent    Aggravating Factors  fell    Pain Relieving Factors tylenol    Multiple Pain Sites No                             [facility] Adult PT Treatment/Exercise - 10/10/20 0001      Lumbar Exercises: Stretches   Figure  4 Stretch 2 reps;60 seconds;Supine    Other Lumbar Stretch Exercise butterfly stretch - 60 sec      Lumbar Exercises: Supine   Bridge with Ball Squeeze Limitations ball squeeze in hooklying - 10x    Straight Leg Raise 10 reps    Large Ball Oblique Isometric Limitations roll outs 10 x - listing to the Rt side - min assist with ball; then knees bent on ball and ball squeeze with blue ball btwn knees      Lumbar Exercises: Quadruped   Madcat/Old Horse 10 reps    Madcat/Old Horse Limitations needed heavy tactile cues                  PT Education - 10/10/20 1617    Education Details Access Code: [email or web address]    Person(s) Educated Patient    Methods Explanation;Demonstration;Handout;Verbal cues;Tactile cues    Comprehension Verbalized understanding;Returned demonstration            PT Short Term Goals - 08/29/20 0827      PT SHORT TERM GOAL #1   Title report 25% reduction of pain    Time 4    Period  Weeks    Status New    Target Date 09/12/20      PT SHORT TERM GOAL #2   Title ind with initial HEP    Time 4    Period Weeks    Status New    Target Date 09/12/20             PT Long Term Goals - 08/15/20 1032      PT LONG TERM GOAL #1   Title Pt will be able to start walking again for exercise for at least 1/2 mile    Baseline can now walk about 2 blocks    Time 12    Period Weeks    Status New    Target Date 11/07/20      PT LONG TERM GOAL #2   Title Pt will be able to sit or stand for at least 45-60    Baseline 15 minutes at a time    Time 12    Period Weeks    Status New    Target Date 11/07/20      PT LONG TERM GOAL #3   Title ind with advanced HEP to continue progression of strength and endurance    Time 12    Period Weeks    Status New    Target Date 11/07/20      PT LONG TERM GOAL #4   Title Pt will report at least 50% decreased pain and numbness due to improved pain management strategies and posutre    Time 12    Period Weeks    Status New     Target Date 11/07/20                 Plan - 10/10/20 1656    Clinical Impression Statement Pt was able to focus on exercises during today's session.  Pt had no increased pain. She needed a lot of cues when doing cat-cow due to lack of mobility in the spine and muscle coordination.  She demonstrates fatigue especially Rt LE today with difficulty activating adductor muscles.  Pt did report she felt better after her treatment last week but the pain returned after her fall. Pt was given basic exercises to do at home and will benefit from skilled PT to continue working on body awareness, improved soft tissue mobility and hip and LE strength and stability for reduced risk of falls.    PT Treatment/Interventions ADLs/Self Care Home Management;Biofeedback;Cryotherapy;Electrical Stimulation;Moist Heat;Traction;Therapeutic activities;Therapeutic exercise;Neuromuscular re-education;Patient/family education;Manual techniques;Taping;Passive range of motion;Dry needling    PT Next Visit Plan 5x sit to stand test; f/u on muscle spasms; LE and core ex's; intial pelvic floor ex with core; lumbar and hip stretches    PT Home Exercise Plan gave moisturizer list; Access Code: [email or web address]    Consulted and Agree with Plan of Care Patient           Patient will benefit from skilled therapeutic intervention in order to improve the following deficits and impairments:  Abnormal gait,Decreased strength,Increased fascial restricitons,Pain,Postural dysfunction,Impaired flexibility,Decreased range of motion,Increased muscle spasms,Difficulty walking  Visit Diagnosis: Abnormal posture  Muscle weakness (generalized)     Problem List Patient Active Problem List   Diagnosis Date Noted  . Chronic pelvic pain in female 05/06/2020  . Anxiety 07/01/2016  . Wrist pain 06/24/2016  . Back pain 06/03/2016  . Abdominal pain, left lower quadrant 12/17/2015  . History of colonic polyps 08/07/2015  . Diverticulitis  05/28/2015  . Seborrheic keratoses 01/30/2015  . Hyperlipidemia  01/30/2015  . Generalized anxiety disorder 07/04/2014  . Diabetes mellitus without complication ([place]) 22/48/2500  . Depression, major, recurrent, moderate ([place]) 07/31/2005  . Hypertension 11/23/2003  . Endometriosis 03/28/1996    Jule Ser, PT 10/10/2020, 5:10 PM  [facility] [address], [place], [place], [address] Phone: [phone]   Fax:  [phone]  Name: Brooke [name] MRN: [id number] Date of Birth: [birth year]

## 2020-10-10 NOTE — Patient Instructions (Signed)
Access Code: [email or web address] URL: [email or web address] Date: 10/10/2020 Prepared by: Jari Favre  Exercises Supine Heel Slide - 1 x daily - 7 x weekly - 3 sets - 10 reps Supine Single Knee to Chest Stretch - 2 x daily - 7 x weekly - 1 sets - 5 reps - 10 sec hold Doorway Hip Flexor Stretch with Chair - 1 x daily - 7 x weekly - 3 reps - 1 sets - 30 hold Supine Hip Adductor Squeeze with Small Ball - 3 x daily - 7 x weekly - 1 sets - 10 reps - 3 sec hold

## 2020-10-17 ENCOUNTER — Encounter: Payer: Self-pay | Admitting: Physical Therapy

## 2020-10-17 ENCOUNTER — Ambulatory Visit: Payer: 59 | Admitting: Physical Therapy

## 2020-10-17 ENCOUNTER — Other Ambulatory Visit: Payer: Self-pay

## 2020-10-17 DIAGNOSIS — R293 Abnormal posture: Secondary | ICD-10-CM | POA: Diagnosis not present

## 2020-10-17 DIAGNOSIS — M6281 Muscle weakness (generalized): Secondary | ICD-10-CM

## 2020-10-17 NOTE — Therapy (Signed)
[facility] [address] [place], [place], [address] Phone: [phone]   Fax:  [phone]  Physical Therapy Treatment  Patient Details  Name: Brooke [name] MRN: [id number] Date of Birth: 08/12/1960 Referring Provider (PT): [name], MD   Encounter Date: 10/17/2020   PT End of Session - 10/17/20 1525    Visit Number 5    Date for PT Re-Evaluation 11/07/20    PT Start Time 9326    PT Stop Time 1527    PT Time Calculation (min) 38 min    Activity Tolerance Patient limited by pain           Past Medical History:  Diagnosis Date  . Abdominal pain, left lower quadrant 12/17/2015  . Chest pain, unspecified 10/05/2003  . Depressive disorder, not elsewhere classified 07/31/2005  . Diabetes mellitus without complication ([place]) 71/24/5809  . Endometriosis 09/14/2012  . Hypertension 11/23/2003  . Unspecified constipation 06/24/2004    Past Surgical History:  Procedure Laterality Date  . APPENDECTOMY  1996   Dr. Lindon Romp  . BREAST SURGERY Bilateral 1996   Dr. Isaiah Blakes  . INGUINAL HERNIA REPAIR    . OVARIAN CYST REMOVAL      There were no vitals filed for this visit.   Subjective Assessment - 10/17/20 1453    Subjective Last week after PT I was in a lot of pain.  I have a prolapse issue with the rectum today.  Not sure how or why it happens    Pertinent History hx; ovarian cyst, inguinal hernia/repair; appendectomy, endo, LLQ pain, diverticulitis, low back pain, anxiety    Patient Stated Goals reduce pain and be able to be more active    Currently in Pain? Yes    Pain Score 8     Pain Location Rectum    Pain Orientation Right    Pain Descriptors / Indicators Aching    Pain Type Acute pain;Chronic pain    Pain Onset In the past 7 days    Multiple Pain Sites No                          Pelvic Floor Special Questions - 10/17/20 0001    Falling out feeling (prolapse) Yes    Activities  that cause feeling of prolapse not sure but has a rectal prolapse today not sure why; pt declined internal because of pain             [facility] Adult PT Treatment/Exercise - 10/17/20 0001      Lumbar Exercises: Stretches   Single Knee to Chest Stretch Right;Left;3 reps      Manual Therapy   Manual therapy comments on wedge pillow in supine    Myofascial Release stomach and left side; Lt rectus abdominus, rectum in central lower abdomen; bilat glute med and lumbar fascia                    PT Short Term Goals - 08/29/20 0827      PT SHORT TERM GOAL #1   Title report 25% reduction of pain    Time 4    Period Weeks    Status New    Target Date 09/12/20      PT SHORT TERM GOAL #2   Title ind with initial HEP    Time 4    Period Weeks    Status New    Target Date 09/12/20  PT Long Term Goals - 08/15/20 1032      PT LONG TERM GOAL #1   Title Pt will be able to start walking again for exercise for at least 1/2 mile    Baseline can now walk about 2 blocks    Time 12    Period Weeks    Status New    Target Date 11/07/20      PT LONG TERM GOAL #2   Title Pt will be able to sit or stand for at least 45-60    Baseline 15 minutes at a time    Time 12    Period Weeks    Status New    Target Date 11/07/20      PT LONG TERM GOAL #3   Title ind with advanced HEP to continue progression of strength and endurance    Time 12    Period Weeks    Status New    Target Date 11/07/20      PT LONG TERM GOAL #4   Title Pt will report at least 50% decreased pain and numbness due to improved pain management strategies and posutre    Time 12    Period Weeks    Status New    Target Date 11/07/20                 Plan - 10/17/20 1529    Clinical Impression Statement Today's session was a slight setback due to pain.  Had to focus more on pain management.  Discussed using pillows to elevate the pelvis during stretches for reduced pressure on the pelvic  floor . Pt had more pain after previous session that was more focused on exercises.  Pt will benefit from skilled PT to progress strength along with pain management.    PT Treatment/Interventions ADLs/Self Care Home Management;Biofeedback;Cryotherapy;Electrical Stimulation;Moist Heat;Traction;Therapeutic activities;Therapeutic exercise;Neuromuscular re-education;Patient/family education;Manual techniques;Taping;Passive range of motion;Dry needling    PT Next Visit Plan 5x sit to stand test; f/u on muscle spasms; LE and core ex's; intial pelvic floor ex with core; lumbar and hip stretches    PT Home Exercise Plan gave moisturizer list; Access Code: [email or web address]    Consulted and Agree with Plan of Care Patient           Patient will benefit from skilled therapeutic intervention in order to improve the following deficits and impairments:  Abnormal gait,Decreased strength,Increased fascial restricitons,Pain,Postural dysfunction,Impaired flexibility,Decreased range of motion,Increased muscle spasms,Difficulty walking  Visit Diagnosis: Abnormal posture  Muscle weakness (generalized)     Problem List Patient Active Problem List   Diagnosis Date Noted  . Chronic pelvic pain in female 05/06/2020  . Anxiety 07/01/2016  . Wrist pain 06/24/2016  . Back pain 06/03/2016  . Abdominal pain, left lower quadrant 12/17/2015  . History of colonic polyps 08/07/2015  . Diverticulitis 05/28/2015  . Seborrheic keratoses 01/30/2015  . Hyperlipidemia 01/30/2015  . Generalized anxiety disorder 07/04/2014  . Diabetes mellitus without complication ([place]) 14/43/1540  . Depression, major, recurrent, moderate ([place]) 07/31/2005  . Hypertension 11/23/2003  . Endometriosis 03/28/1996    Brooke [name], PT 10/17/2020, 3:31 PM  [facility] [address], [place], [place], [address] Phone: [phone]   Fax:  [phone]  Name: Brooke [name] MRN:  [id number] Date of Birth: 1961-06-27

## 2020-10-24 ENCOUNTER — Ambulatory Visit: Payer: 59 | Admitting: Physical Therapy

## 2020-10-24 ENCOUNTER — Other Ambulatory Visit: Payer: Self-pay

## 2020-10-24 ENCOUNTER — Encounter: Payer: Self-pay | Admitting: Physical Therapy

## 2020-10-24 DIAGNOSIS — M6281 Muscle weakness (generalized): Secondary | ICD-10-CM

## 2020-10-24 DIAGNOSIS — R293 Abnormal posture: Secondary | ICD-10-CM | POA: Diagnosis not present

## 2020-10-24 NOTE — Therapy (Signed)
[facility] [address] [place], [place], [address] Phone: [phone]   Fax:  [phone]  Physical Therapy Treatment  Patient Details  Name: Brooke [name] MRN: [id number] Date of Birth: [birth year] Referring Provider (PT): [name], MD   Encounter Date: 10/24/2020   PT End of Session - 10/24/20 1616    Visit Number 6    Date for PT Re-Evaluation 11/07/20    PT Start Time 5885    PT Stop Time 1525    PT Time Calculation (min) 40 min    Activity Tolerance Patient limited by pain    Behavior During Therapy [facility] for tasks assessed/performed           Past Medical History:  Diagnosis Date  . Abdominal pain, left lower quadrant 12/17/2015  . Chest pain, unspecified 10/05/2003  . Depressive disorder, not elsewhere classified 07/31/2005  . Diabetes mellitus without complication ([place]) 02/77/4128  . Endometriosis 09/14/2012  . Hypertension 11/23/2003  . Unspecified constipation 06/24/2004    Past Surgical History:  Procedure Laterality Date  . APPENDECTOMY  1996   Dr. Lindon Romp  . BREAST SURGERY Bilateral 1996   Dr. Isaiah Blakes  . INGUINAL HERNIA REPAIR    . OVARIAN CYST REMOVAL      There were no vitals filed for this visit.   Subjective Assessment - 10/24/20 1456    Subjective I have been so tight in the abdomen and back.  I did an enema and pain shot up the Lt side of my trunk.    Pertinent History hx; ovarian cyst, inguinal hernia/repair; appendectomy, endo, LLQ pain, diverticulitis, low back pain, anxiety    Patient Stated Goals reduce pain and be able to be more active    Currently in Pain? Yes    Pain Score 7     Pain Location Generalized    Pain Orientation Mid    Pain Descriptors / Indicators Aching    Pain Type Acute pain    Pain Onset In the past 7 days                          Pelvic Floor Special Questions - 10/24/20 0001    Pelvic Floor Internal Exam pt identity  confirmed and informed consent given to perform internal assessment    Exam Type Vaginal    Biofeedback manual cues to relax and breathe             [facility] Adult PT Treatment/Exercise - 10/24/20 0001      Lumbar Exercises: Stretches   Other Lumbar Stretch Exercise butterfly stretch - 60 sec      Lumbar Exercises: Aerobic   Nustep L1 seat 6 PT present for status x4 min      Manual Therapy   Manual Therapy Internal Pelvic Floor    Myofascial Release sacral release    Internal Pelvic Floor stretch to levators bilaterally - instructed to have her husband help to stretching with vit E and coconut oil                    PT Short Term Goals - 08/29/20 0827      PT SHORT TERM GOAL #1   Title report 25% reduction of pain    Time 4    Period Weeks    Status New    Target Date 09/12/20      PT SHORT TERM GOAL #2   Title ind with  initial HEP    Time 4    Period Weeks    Status New    Target Date 09/12/20             PT Long Term Goals - 08/15/20 1032      PT LONG TERM GOAL #1   Title Pt will be able to start walking again for exercise for at least 1/2 mile    Baseline can now walk about 2 blocks    Time 12    Period Weeks    Status New    Target Date 11/07/20      PT LONG TERM GOAL #2   Title Pt will be able to sit or stand for at least 45-60    Baseline 15 minutes at a time    Time 12    Period Weeks    Status New    Target Date 11/07/20      PT LONG TERM GOAL #3   Title ind with advanced HEP to continue progression of strength and endurance    Time 12    Period Weeks    Status New    Target Date 11/07/20      PT LONG TERM GOAL #4   Title Pt will report at least 50% decreased pain and numbness due to improved pain management strategies and posutre    Time 12    Period Weeks    Status New    Target Date 11/07/20                 Plan - 10/24/20 1532    Clinical Impression Statement Pt responded well to sacral release in supine one hand  anterior one posterior pelvis.  Re-assessed pelvic floor today and still has high tone.  Pt has not been doing self massage so was educated in using coconut oil and vitamin e.  Pt states her husband would be able to help her do stretches to vaginal canal.  Pt did not report increased pain during today's session and she will benefit from skilled PT to continue to address very chronic pain for pain management.    Comorbidities hx; ovarian cyst, inguinal hernia/repair; appendectomy, endo, LLQ pain, diverticulitis, low back pain, anxiety    PT Treatment/Interventions ADLs/Self Care Home Management;Biofeedback;Cryotherapy;Electrical Stimulation;Moist Heat;Traction;Therapeutic activities;Therapeutic exercise;Neuromuscular re-education;Patient/family education;Manual techniques;Taping;Passive range of motion;Dry needling    PT Next Visit Plan 5x sit to stand test; f/u on muscle spasms; LE and core ex's; intial pelvic floor ex with core; lumbar and hip stretches    PT Home Exercise Plan gave moisturizer list; Access Code: [email or web address]    Consulted and Agree with Plan of Care Patient           Patient will benefit from skilled therapeutic intervention in order to improve the following deficits and impairments:  Abnormal gait,Decreased strength,Increased fascial restricitons,Pain,Postural dysfunction,Impaired flexibility,Decreased range of motion,Increased muscle spasms,Difficulty walking  Visit Diagnosis: Abnormal posture  Muscle weakness (generalized)     Problem List Patient Active Problem List   Diagnosis Date Noted  . Chronic pelvic pain in female 05/06/2020  . Anxiety 07/01/2016  . Wrist pain 06/24/2016  . Back pain 06/03/2016  . Abdominal pain, left lower quadrant 12/17/2015  . History of colonic polyps 08/07/2015  . Diverticulitis 05/28/2015  . Seborrheic keratoses 01/30/2015  . Hyperlipidemia 01/30/2015  . Generalized anxiety disorder 07/04/2014  . Diabetes mellitus without complication  ([place]) 05/26/5101  . Depression, major, recurrent, moderate ([place]) 07/31/2005  . Hypertension 11/23/2003  .  Endometriosis 03/28/1996    Jule Ser, PT 10/24/2020, 4:17 PM  [facility] [address] [place], [place], [address] Phone: [phone]   Fax:  [phone]  Name: Danea Manter MRN: [id number] Date of Birth: [birth year]

## 2020-10-31 ENCOUNTER — Encounter: Payer: Self-pay | Admitting: Physical Therapy

## 2020-10-31 ENCOUNTER — Ambulatory Visit: Payer: 59 | Admitting: Physical Therapy

## 2020-10-31 ENCOUNTER — Other Ambulatory Visit: Payer: Self-pay

## 2020-10-31 DIAGNOSIS — R293 Abnormal posture: Secondary | ICD-10-CM | POA: Diagnosis not present

## 2020-10-31 DIAGNOSIS — M6281 Muscle weakness (generalized): Secondary | ICD-10-CM

## 2020-10-31 NOTE — Therapy (Addendum)
[facility] [address], [place], [place], [address] Phone: [phone]   Fax:  [phone]  Physical Therapy Treatment  Patient Details  Name: Brooke [name] MRN: [id number] Date of Birth: Dec 25, 1960 Referring Provider (PT): [name], MD   Encounter Date: 10/31/2020   PT End of Session - 10/31/20 1458     Visit Number 7    Date for PT Re-Evaluation 11/07/20    PT Start Time 2440    PT Stop Time 1520    PT Time Calculation (min) 29 min    Activity Tolerance Patient limited by pain    Behavior During Therapy [facility] for tasks assessed/performed             Past Medical History:  Diagnosis Date   Abdominal pain, left lower quadrant 12/17/2015   Chest pain, unspecified 10/05/2003   Depressive disorder, not elsewhere classified 07/31/2005   Diabetes mellitus without complication ([place]) 06/06/2535   Endometriosis 09/14/2012   Hypertension 11/23/2003   Unspecified constipation 06/24/2004    Past Surgical History:  Procedure Laterality Date   APPENDECTOMY  1996   Dr. Lindon Romp   BREAST SURGERY Bilateral 1996   Dr. Isaiah Blakes   INGUINAL HERNIA REPAIR     OVARIAN CYST REMOVAL      There were no vitals filed for this visit.   Subjective Assessment - 10/31/20 1456     Subjective I have been feeling pain across upper abdomen like scraping and lower abdomen feels like severe.    Patient Stated Goals reduce pain and be able to be more active    Currently in Pain? Yes    Pain Score 9     Pain Location Abdomen    Pain Orientation Upper;Lower    Pain Descriptors / Indicators Aching;Cramping    Pain Type Chronic pain    Pain Onset More than a month ago    Pain Frequency Intermittent    Multiple Pain Sites No                               [facility] Adult PT Treatment/Exercise - 10/31/20 0001       Lumbar Exercises: Stretches   Active Hamstring Stretch Right;Left;2 reps;30 seconds    Hip  Flexor Stretch Right;Left;3 reps;20 seconds      Lumbar Exercises: Standing   Other Standing Lumbar Exercises hip flex, ext, abduction - 1.5 lb - 2x10      Lumbar Exercises: Seated   Long Arc Quad on Chair Strengthening;Right;Left;2 sets;10 reps    LAQ on Chair Weights (lbs) 1.5    Hip Flexion on Ball Limitations hip flexion seated; 1.5 20x each    Sit to Stand 5 reps   3 sets - 20, 18, 15 sec                     PT Short Term Goals - 10/31/20 1459       PT SHORT TERM GOAL #1   Title report 25% reduction of pain    Status Not Met      PT SHORT TERM GOAL #2   Title ind with initial HEP    Status Achieved               PT Long Term Goals - 10/31/20 1501       PT LONG TERM GOAL #1   Title Pt will be able to start walking again  for exercise for at least 1/2 mile    Baseline walked in the mall for 45 minutes and it was a half mile due to stopping and starting, but not consistent    Status On-going      PT LONG TERM GOAL #2   Title Pt will be able to sit or stand for at least 45-60    Baseline sitting for 35 minutes; standing 20 minutes because left side is weak    Status On-going      PT LONG TERM GOAL #3   Title ind with advanced HEP to continue progression of strength and endurance    Status On-going      PT LONG TERM GOAL #4   Title Pt will report at least 50% decreased pain and numbness due to improved pain management strategies and posutre    Status On-going                   Plan - 10/31/20 1621     Clinical Impression Statement Pt is still having overall same amount of pain.  Pt able to walk 1/2 mile but has not been doing consistently yet.  Pt was able to do more focus on LE strength today.  Exercises added to HEP for improved function.  Will benefit from skilled PT to continue working on strength and function    PT Treatment/Interventions ADLs/Self Care Home Management;Biofeedback;Cryotherapy;Electrical Stimulation;Moist  Heat;Traction;Therapeutic activities;Therapeutic exercise;Neuromuscular re-education;Patient/family education;Manual techniques;Taping;Passive range of motion;Dry needling    PT Next Visit Plan re-do 5xsit to stand; progress HEP    PT Home Exercise Plan gave moisturizer list; Access Code: [email or web address]    Consulted and Agree with Plan of Care Patient             Patient will benefit from skilled therapeutic intervention in order to improve the following deficits and impairments:  Abnormal gait,Decreased strength,Increased fascial restricitons,Pain,Postural dysfunction,Impaired flexibility,Decreased range of motion,Increased muscle spasms,Difficulty walking  Visit Diagnosis: Abnormal posture  Muscle weakness (generalized)     Problem List Patient Active Problem List   Diagnosis Date Noted   Chronic pelvic pain in female 05/06/2020   Anxiety 07/01/2016   Wrist pain 06/24/2016   Back pain 06/03/2016   Abdominal pain, left lower quadrant 12/17/2015   History of colonic polyps 08/07/2015   Diverticulitis 05/28/2015   Seborrheic keratoses 01/30/2015   Hyperlipidemia 01/30/2015   Generalized anxiety disorder 07/04/2014   Diabetes mellitus without complication ([place]) 77/93/9030   Depression, major, recurrent, moderate ([place]) 07/31/2005   Hypertension 11/23/2003   Endometriosis 03/28/1996    Camillo Flaming Jorian [place], PT 10/31/2020, 4:50 PM  [facility] [address] [place], [place], [address] Phone: [phone]   Fax:  [phone]  Name: Brooke [name] MRN: [id number] Date of Birth: May 24, 1961  PHYSICAL THERAPY DISCHARGE SUMMARY  Visits from Start of Care: 7  Current functional level related to goals / functional outcomes: See above goals   Remaining deficits: See above   Education / Equipment: HEP   Patient agrees to discharge. Patient goals were not met. Patient is being discharged due to not returning since the  last visit.  Gustavus Bryant, PT 02/26/21 11:19 AM

## 2020-11-07 ENCOUNTER — Ambulatory Visit (HOSPITAL_COMMUNITY)
Admission: EM | Admit: 2020-11-07 | Discharge: 2020-11-07 | Disposition: A | Discharge status: Home / Self Care | Payer: 59 | Source: Home / Self Care

## 2020-11-07 ENCOUNTER — Encounter (HOSPITAL_COMMUNITY): Payer: Self-pay

## 2020-11-07 ENCOUNTER — Emergency Department (HOSPITAL_COMMUNITY)
Admission: EM | Admit: 2020-11-07 | Discharge: 2020-11-07 | Disposition: A | Discharge status: Home / Self Care | Payer: 59 | Attending: Emergency Medicine | Admitting: Emergency Medicine

## 2020-11-07 ENCOUNTER — Emergency Department (HOSPITAL_COMMUNITY): Payer: 59

## 2020-11-07 ENCOUNTER — Ambulatory Visit: Payer: 59 | Admitting: Physical Therapy

## 2020-11-07 ENCOUNTER — Other Ambulatory Visit: Payer: Self-pay

## 2020-11-07 DIAGNOSIS — I10 Essential (primary) hypertension: Secondary | ICD-10-CM | POA: Insufficient documentation

## 2020-11-07 DIAGNOSIS — E119 Type 2 diabetes mellitus without complications: Secondary | ICD-10-CM | POA: Diagnosis not present

## 2020-11-07 DIAGNOSIS — R109 Unspecified abdominal pain: Secondary | ICD-10-CM

## 2020-11-07 DIAGNOSIS — R1012 Left upper quadrant pain: Secondary | ICD-10-CM | POA: Insufficient documentation

## 2020-11-07 DIAGNOSIS — R102 Pelvic and perineal pain: Secondary | ICD-10-CM

## 2020-11-07 DIAGNOSIS — Z7982 Long term (current) use of aspirin: Secondary | ICD-10-CM | POA: Diagnosis not present

## 2020-11-07 DIAGNOSIS — R030 Elevated blood-pressure reading, without diagnosis of hypertension: Secondary | ICD-10-CM | POA: Insufficient documentation

## 2020-11-07 DIAGNOSIS — M549 Dorsalgia, unspecified: Secondary | ICD-10-CM | POA: Diagnosis not present

## 2020-11-07 DIAGNOSIS — R1011 Right upper quadrant pain: Secondary | ICD-10-CM | POA: Insufficient documentation

## 2020-11-07 DIAGNOSIS — M25559 Pain in unspecified hip: Secondary | ICD-10-CM

## 2020-11-07 DIAGNOSIS — R1013 Epigastric pain: Secondary | ICD-10-CM | POA: Diagnosis not present

## 2020-11-07 DIAGNOSIS — G8929 Other chronic pain: Secondary | ICD-10-CM

## 2020-11-07 LAB — COMPREHENSIVE METABOLIC PANEL
ALT: 10 U/L (ref 0–44)
AST: 12 U/L — ABNORMAL LOW (ref 15–41)
Albumin: 4 g/dL (ref 3.5–5.0)
Alkaline Phosphatase: 85 U/L (ref 38–126)
Anion gap: 10 (ref 5–15)
BUN: 8 mg/dL (ref 6–20)
CO2: 26 mmol/L (ref 22–32)
Calcium: 9.6 mg/dL (ref 8.9–10.3)
Chloride: 104 mmol/L (ref 98–111)
Creatinine, Ser: 0.59 mg/dL (ref 0.44–1.00)
GFR, Estimated: >60 mL/min (ref >60)
Glucose, Bld: 274 mg/dL — ABNORMAL HIGH (ref 70–99)
Potassium: 4.6 mmol/L (ref 3.5–5.1)
Sodium: 140 mmol/L (ref 135–145)
Total Bilirubin: 0.5 mg/dL (ref 0.3–1.2)
Total Protein: 8.4 g/dL — ABNORMAL HIGH (ref 6.5–8.1)

## 2020-11-07 LAB — LIPASE, BLOOD: Lipase: 29 U/L (ref 11–51)

## 2020-11-07 LAB — POCT URINALYSIS DIPSTICK, ED / UC
Bilirubin Urine: NEGATIVE
Glucose, UA: >=1000 mg/dL — AB
Ketones, ur: NEGATIVE mg/dL
Leukocytes,Ua: NEGATIVE
Nitrite: NEGATIVE
Protein, ur: 100 mg/dL — AB
Specific Gravity, Urine: 1.025 (ref 1.005–1.030)
Urobilinogen, UA: 0.2 mg/dL (ref 0.0–1.0)
pH: 5 (ref 5.0–8.0)

## 2020-11-07 LAB — CBC
HCT: 42.5 % (ref 36.0–46.0)
Hemoglobin: 14.4 g/dL (ref 12.0–15.0)
MCH: 27.2 pg (ref 26.0–34.0)
MCHC: 33.9 g/dL (ref 30.0–36.0)
MCV: 80.3 fL (ref 80.0–100.0)
Platelets: 289 10*3/uL (ref 150–400)
RBC: 5.29 MIL/uL — ABNORMAL HIGH (ref 3.87–5.11)
RDW: 12.6 % (ref 11.5–15.5)
WBC: 7.9 10*3/uL (ref 4.0–10.5)
nRBC: 0 % (ref 0.0–0.2)

## 2020-11-07 MED ORDER — SODIUM CHLORIDE 0.9 % IV BOLUS
1000.0000 mL | Freq: Once | INTRAVENOUS | Status: AC
Start: 2020-11-07 — End: 2020-11-07
  Administered 2020-11-07: 1000 mL via INTRAVENOUS

## 2020-11-07 MED ORDER — FAMOTIDINE 20 MG PO TABS: 20.0000 mg | ORAL_TABLET | Freq: Two times a day (BID) | ORAL | 0 refills | Status: DC

## 2020-11-07 MED ORDER — DICYCLOMINE HCL 20 MG PO TABS: 20.0000 mg | ORAL_TABLET | Freq: Two times a day (BID) | ORAL | 0 refills | Status: DC

## 2020-11-07 MED ORDER — IOHEXOL 300 MG/ML  SOLN
100.0000 mL | Freq: Once | INTRAMUSCULAR | Status: AC | PRN
  Administered 2020-11-07: 80 mL via INTRAVENOUS

## 2020-11-07 MED ORDER — IOHEXOL 350 MG/ML SOLN: 100.0000 mL | Freq: Once | INTRAVENOUS | Status: DC | PRN

## 2020-11-07 NOTE — Discharge Instructions (Signed)
It was our pleasure taking care of you here in the emergency department today.  Your CT scan showed a possible esophagitis which is irritation of the esophagus.  This does not necessarily correlate with your upper abdominal pain.  I am unsure the cause of this.  I started you on a medication called Bentyl as well as Pepcid.  I would recommend following up with the GI doctors.  Your blood pressure was also elevated here in the emergency department.  I would suggest following up with your primary care doctor to possibly start medications for this  Return for any worsening symptoms.

## 2020-11-07 NOTE — ED Triage Notes (Signed)
Patient c/o abdominal and lower back pain x several months. Patient denies any N/v/D

## 2020-11-07 NOTE — ED Provider Notes (Signed)
[facility] DEPT Provider Note   CSN: [id number] Arrival date & time: 11/07/20  1233     History Chief Complaint  Patient presents with  . Abdominal Pain  . Back Pain    Brooke [name] is a 60 y.o. female with history significant for diabetes, hypertension, chronic abdominal pain, chronic pelvic pain who presents for evaluation of abdominal pain.  Began 3 months ago. Pain to right upper quadrant and left upper quadrant.  Will occasionally radiate into her flanks.  She has no current back pain.  No saddle paresthesias, bowel or bladder incontinence, saddle paresthesia.  She denies any associated chest pain, shortness of breath, hemoptysis.  No dysuria, hematuria.  Pain is occasionally worse with movement. Pain is not worse with food intake.  Patient states she is followed by physical therapy and Dr. Clovis [name] with GYN for her chronic pelvic pain.  Patient states her upper abdominal pain has not improved her has not worsened.  She denies any fever, chills, nausea, vomiting, chest pain, shortness of breath, paresthesias or weakness.  Denies additional aggravating or alleviating factors.  Has some intermittent lower back pain however none currently.  No history of IV drug use, bowel or bladder incontinence, saddle paresthesia.  No recent trauma or injuries.  History obtained from patient and past medical records.  No interpreter used.   <<GYN>>06/28/2020- "History of endometriosis with chronic pelvic painfor greater than 20 years. Has had 3 surgeries for this [place] Cymbalta, gabapentin, muscle relaxers, narcotics, and anti-inflammatories. Current regimen includes Midol and Zanaflex.History of ovarian cysts with two cystectomies. Ultrasound 01/2019 showed uterine fibroids, unable to visualize endometrial thickness. Abnormal pap in 1988/1992, subsequent normal."  <<2016 Colonoscopy- Dr. Ardis Hughs  Polyps and Diverticulosis in 04/2015 Patient not currently followed by GI.    HPI     Past Medical History:  Diagnosis Date  . Abdominal pain, left lower quadrant 12/17/2015  . Chest pain, unspecified 10/05/2003  . Depressive disorder, not elsewhere classified 07/31/2005  . Diabetes mellitus without complication ([place]) 89/21/1941  . Endometriosis 09/14/2012  . Hypertension 11/23/2003  . Unspecified constipation 06/24/2004    Patient Active Problem List   Diagnosis Date Noted  . Chronic pelvic pain in female 05/06/2020  . Anxiety 07/01/2016  . Wrist pain 06/24/2016  . Back pain 06/03/2016  . Abdominal pain, left lower quadrant 12/17/2015  . History of colonic polyps 08/07/2015  . Diverticulitis 05/28/2015  . Seborrheic keratoses 01/30/2015  . Hyperlipidemia 01/30/2015  . Generalized anxiety disorder 07/04/2014  . Diabetes mellitus without complication ([place]) 74/03/1447  . Depression, major, recurrent, moderate ([place]) 07/31/2005  . Hypertension 11/23/2003  . Endometriosis 03/28/1996    Past Surgical History:  Procedure Laterality Date  . APPENDECTOMY  1996   Dr. Lindon Romp  . BREAST SURGERY Bilateral 1996   Dr. Isaiah Blakes  . INGUINAL HERNIA REPAIR    . OVARIAN CYST REMOVAL       OB History   No obstetric history on file.     Family History  Problem Relation Age of Onset  . Diabetes Mother   . Hypertension Mother   . Arthritis Mother        RA  . Pancreatic cancer Father   . Stomach cancer Neg Hx   . Colon cancer Neg Hx     Social History   Tobacco Use  . Smoking status: Never Smoker  . Smokeless tobacco: Never Used  Vaping Use  . Vaping Use:  Never used  Substance Use Topics  . Alcohol use: No  . Drug use: No    Home Medications Prior to Admission medications   Medication Sig Start Date End Date Taking? Authorizing Provider  acetaminophen (TYLENOL) 500 MG tablet Take 1,000 mg by mouth every 6 (six) hours as needed for mild pain, fever or headache.   Yes  [provider]  Aspirin-Acetaminophen-Caffeine (PAMPRIN MAX PO) Take 1 tablet by mouth 2 (two) times daily as needed (pelvic pain).   Yes [provider]  cholecalciferol (VITAMIN D3) 25 MCG (1000 UNIT) tablet Take 4,000 Units by mouth daily.   Yes [provider]  dicyclomine (BENTYL) 20 MG tablet Take 1 tablet (20 mg total) by mouth 2 (two) times daily. 11/07/20  Yes Kalecia Hartney A, PA-C  famotidine (PEPCID) 20 MG tablet Take 1 tablet (20 mg total) by mouth 2 (two) times daily. 11/07/20  Yes Shawntina Diffee A, PA-C  methocarbamol (ROBAXIN) 500 MG tablet TAKE 1 TABLET BY MOUTH EVERY 6 HOURS AS NEEDED FOR MUSCLE SPASMS. Patient taking differently: Take 500 mg by mouth every 6 (six) hours as needed for muscle spasms. 10/16/20  Yes Marny Lowenstein A, NP  ALPRAZolam Duanne Moron) 0.25 MG tablet Take 1 tablet (0.25 mg total) by mouth daily as needed for anxiety. Patient not taking: Reported on 11/07/2020 06/28/20   [name], MD    Allergies    Aspirin, Darvocet [propoxyphene n-acetaminophen], Erythromycin, Gabapentin, Ibuprofen, and Metformin and related  Review of Systems   Review of Systems  Constitutional: Negative.   HENT: Negative.   Respiratory: Negative.   Cardiovascular: Negative.   Gastrointestinal: Positive for abdominal pain. Negative for abdominal distention, anal bleeding, blood in stool, constipation, diarrhea, nausea, rectal pain and vomiting.  Genitourinary: Positive for pelvic pain (Chronic x 16 years).  Musculoskeletal: Negative.   Skin: Negative.   Neurological: Negative.   All other systems reviewed and are negative.   Physical Exam Updated Vital Signs BP (!) 193/97   Pulse 79   Temp 98 F (36.7 C) (Oral)   Resp 18   Ht 5\' 4"  (1.626 m)   Wt 52.6 kg   LMP 11/16/2014 (Approximate)   SpO2 100%   BMI 19.91 kg/m   Physical Exam Vitals and nursing note reviewed.  Constitutional:      General: She is not in acute distress.     Appearance: She is well-developed. She is not ill-appearing, toxic-appearing or diaphoretic.  HENT:     Head: Normocephalic and atraumatic.     Mouth/Throat:     Mouth: Mucous membranes are moist.  Eyes:     Pupils: Pupils are equal, round, and reactive to light.  Cardiovascular:     Rate and Rhythm: Normal rate.     Pulses: Normal pulses.          Radial pulses are 2+ on the right side and 2+ on the left side.     Heart sounds: Normal heart sounds.  Pulmonary:     Effort: Pulmonary effort is normal. No respiratory distress.     Breath sounds: Normal breath sounds and air entry.     Comments: Clear to auscultation bilaterally.  Speaks in full sentences without difficulty Chest:     Comments: Equal rise and fall to chest wall Abdominal:     General: Bowel sounds are normal. There is no distension.     Palpations: Abdomen is soft.     Tenderness: There is abdominal tenderness in the right upper quadrant,  epigastric area and left upper quadrant. There is no right CVA tenderness, left CVA tenderness, guarding or rebound. Negative signs include Murphy's sign and McBurney's sign.     Hernia: No hernia is present.     Comments: Diffuse tenderness to abdomen, worse in upper quadrants however negative Murphy sign.  Musculoskeletal:        General: Normal range of motion.     Cervical back: Normal range of motion.     Comments: No bony tenderness.  Moves all 4 extremities at difficulty.  Compartments soft  Skin:    General: Skin is warm and dry.     Capillary Refill: Capillary refill takes less than 2 seconds.     Comments: No rashes or lesions  Neurological:     General: No focal deficit present.     Mental Status: She is alert.     Cranial Nerves: Cranial nerves are intact.     Sensory: Sensation is intact.     Motor: Motor function is intact.     Gait: Gait is intact.    ED Results / Procedures / Treatments   Labs (all labs ordered are listed, but only abnormal results are  displayed) Labs Reviewed  COMPREHENSIVE METABOLIC PANEL - Abnormal; Notable for the following components:      Result Value   Glucose, Bld 274 (*)    Total Protein 8.4 (*)    AST 12 (*)    All other components within normal limits  CBC - Abnormal; Notable for the following components:   RBC 5.29 (*)    All other components within normal limits  LIPASE, BLOOD    EKG None  Radiology CT Abdomen Pelvis W Contrast  Result Date: 11/07/2020 CLINICAL DATA:  Bilateral upper abdominal pain. EXAM: CT ABDOMEN AND PELVIS WITH CONTRAST TECHNIQUE: Multidetector CT imaging of the abdomen and pelvis was performed using the standard protocol following bolus administration of intravenous contrast. CONTRAST:  73mL OMNIPAQUE IOHEXOL 300 MG/ML  SOLN COMPARISON:  Pelvic ultrasound 05/27/2016 FINDINGS: Lower chest: No acute findings. Incidental accessory fissure in the right lower lobe. Hepatobiliary: No focal liver abnormality is seen. No gallstones, gallbladder wall thickening, or biliary dilatation. Pancreas: No ductal dilatation or inflammation. Spleen: Normal in size without focal abnormality. Adrenals/Urinary Tract: Normal adrenal glands. No hydronephrosis or perinephric edema. Homogeneous renal enhancement with symmetric excretion on delayed phase imaging. Urinary bladder is physiologically distended without wall thickening. Stomach/Bowel: Detailed bowel evaluation is limited in the absence of enteric contrast. Minimal distal esophageal wall thickening. Decompressed stomach. No small bowel obstruction or inflammatory change. Appendectomy. Moderate colonic stool burden. Mild descending colonic diverticulosis. No diverticulitis. No colonic wall thickening or inflammation. Vascular/Lymphatic: No acute vascular findings. Abdominal aorta is normal in caliber. Aortic atherosclerosis. Patent portal vein. Circumaortic left renal vein. Mesenteric vessels are patent. No adenopathy. Reproductive: Bulbous uterus with  fibroids, less well-defined on the current exam than previously. Uterus is retroverted. There is no adnexal mass. Ovaries are not well-defined. Other: Trace free fluid in the pelvis. No upper abdominal ascites. No free air. Tiny fat containing umbilical hernia. Musculoskeletal: Mild scoliosis and degenerative change in the lumbar spine. There are no acute or suspicious osseous abnormalities. IMPRESSION: 1. Minimal distal esophageal wall thickening, can be seen with reflux or esophagitis. 2. Mild descending colonic diverticulosis without diverticulitis. 3. Bulbous uterus with fibroids, less well-defined on the current exam than previously. Aortic Atherosclerosis (ICD10-I70.0). Electronically Signed   By: Keith Rake M.D.   On: 11/07/2020 15:34  Procedures Procedures   Medications Ordered in ED Medications  iohexol (OMNIPAQUE) 300 MG/ML solution 100 mL (80 mLs Intravenous Contrast Given 11/07/20 1500)  sodium chloride 0.9 % bolus 1,000 mL (1,000 mLs Intravenous New Bag/Given 11/07/20 1512)   ED Course  I have reviewed the triage vital signs and the nursing notes.  Pertinent labs & imaging results that were available during my care of the patient were reviewed by me and considered in my medical decision making (see chart for details).  60 year old here for evaluation abdominal pain.  Afebrile, nonseptic, non-ill-appearing has history of chronic abdominal pain.  Not followed by GI.  Pain not worse with food intake.  Will occasionally radiate into bilateral flanks however no back pain or flank pain currently.  No urinary complaints.  No chest pain, shortness of breath, hemoptysis.  No unilateral leg swelling, redness or warmth.  Heart lungs clear.  Abdomen some mild upper tenderness however negative Murphy sign.  Negative CVA tap bilaterally.  Urine reviewed from urgent care does not show evidence of infection.  Plan on labs, imaging and reassess  Labs and imaging personally reviewed and  interpreted:  CBC without leukocytosis Metabolic panel glucose 300, given IV fluids, similar to prior other visits.  Normal anion gap, no ketones in urgent care visit, low suspicion for HHS, DKA Lipase 29 CT abdomen pelvis with possible esophagitis, fibroid uterus, no acute findings  Patient reassessed.  Does not want anything for pain.  She is tolerating p.o. intake.  Unclear etiology of patient's symptoms given the been going on for many months. Patient does not meet the SIRS or Sepsis criteria.  On repeat exam patient does not have a surgical abdomin and there are no peritoneal signs.  No indication of appendicitis, bowel obstruction, bowel perforation, cholecystitis, diverticulitis, AAA, dissection, Atypical ACS, PE, PNA, PID or ectopic pregnancy.  Patient is hypertensive here.  I will suspicion for acute hypertensive urgency or emergency.  No headache, chest pain, shortness of breath, paresthesias, weakness, emesis.  Does have history of hypertension who is on any medications.  Discussed close follow-up with PCP for evaluation of this   Will DC home with symptomatic management and have follow-up with GI.  She is agreeable to this.  The patient has been appropriately medically screened and/or stabilized in the ED. I have low suspicion for any other emergent medical condition which would require further screening, evaluation or treatment in the ED or require inpatient management.  Patient is hemodynamically stable and in no acute distress.  Patient able to ambulate in department prior to ED.  Evaluation does not show acute pathology that would require ongoing or additional emergent interventions while in the emergency department or further inpatient treatment.  I have discussed the diagnosis with the patient and answered all questions.  Pain is been managed while in the emergency department and patient has no further complaints prior to discharge.  Patient is comfortable with plan discussed in  room and is stable for discharge at this time.  I have discussed strict return precautions for returning to the emergency department.  Patient was encouraged to follow-up with PCP/specialist refer to at discharge.     MDM Rules/Calculators/A&P                           Final Clinical Impression(s) / ED Diagnoses Final diagnoses:  Chronic abdominal pain  Hypertension, unspecified type    Rx / DC Orders ED Discharge Orders  Ordered    dicyclomine (BENTYL) 20 MG tablet  2 times daily        11/07/20 1630    famotidine (PEPCID) 20 MG tablet  2 times daily        11/07/20 1630           Jaeleah Smyser A, PA-C 11/07/20 1633    Daleen Bo, MD 11/08/20 [phone]

## 2020-11-07 NOTE — ED Notes (Signed)
Patient is being discharged from the Urgent Care and sent to the Emergency Department via POV . Per Sherryle Lis, patient is in need of higher level of care due to abdominal pain. Patient is aware and verbalizes understanding of plan of care.  Vitals:   11/07/20 1135  BP: (S) (!) 187/98  Pulse: 92  Resp: 16  Temp: 98.6 F (37 C)  SpO2: 100%

## 2020-11-07 NOTE — Discharge Instructions (Addendum)
-  Head straight to [facility] emergency room for further evaluation and management of your abdominal pain.  If you experience worsening of symptoms on the way, like worsening pain, dizziness, chest pain, shortness of breath-stop and call 911 immediately.

## 2020-11-07 NOTE — ED Triage Notes (Addendum)
Patient presents to Urgent Care with complaints of abdominal, back pain x couple months ago. Pt states hip pain is an ongoing issue x 2 years ago related to prolapse, fibroids, endometriosis and currently being seen by physical therapy. She reports the back pain and abdominal pain increased a few months ago she is unsure if related to PT. She states she has been on Robaxin, tylenol with no relief. Her PT is aware of back and abdominal pain instructed to continue to take Robaxin. Has a hx of urinary freq. Related to the prolapse.   Denies fever, n/v, or diarrhea.

## 2020-11-07 NOTE — ED Provider Notes (Signed)
[place]    CSN: [id number] Arrival date & time: 11/07/20  1104      History   Chief Complaint Chief Complaint  Patient presents with  . Abdominal Pain  . Back Pain  . Hip Pain    HPI Brooke [name] is a 60 y.o. female presenting with abdominal pain, back pain.  History of chronic abdominal pain, pelvic pain and fibroids that is followed by GYN,  pelvic floor weakness, chest pain, endometriosis, hypertension, diabetes.  She is postmenopausal.  States she has been going to physical therapy for her pelvic floor weakness and pain, thinks that this is making her pelvic pain worse instead of better.  Endorses 3 months of diffuse abdominal pain radiating to her pelvis, as well as left upper quadrant pain radiating to back and right upper quadrant pain.  States pain is worse with movement, but denies other triggers like eating, time of day.  Denies changes in bowel or bladder function.  Last bowel movement was 1 day ago and was normal, still passing gas.  States she does not have an appointment with her gynecologist coming up.  Blood pressure is typically well controlled on no medications, denies chest pain, dizziness, shortness of breath, left arm pain, headaches, vision changes.  Has been monitoring her blood pressure at home and this typically runs 130 over 80s.   Last gyn note 06/28/2020- "History of endometriosis with chronic pelvic painfor greater than 20 years. Has had 3 surgeries for this [place] Cymb[place], gabapentin, muscle relaxers, narcotics, and anti-inflammatories. Current regimen includes Midol and Zanaflex.History of ovarian cysts with two cystectomies. Ultrasound 01/2019 showed uterine fibroids, unable to visualize endometrial thickness. Abnormal pap in 1988/1992, subsequent normal." Postmenopausal x many years, on no HRT. No PMB. Colonoscopy showed Polyps and Diverticulosis in  04/2015."  HPI  Past Medical History:  Diagnosis Date  . Abdominal pain, left lower quadrant 12/17/2015  . Chest pain, unspecified 10/05/2003  . Depressive disorder, not elsewhere classified 07/31/2005  . Diabetes mellitus without complication ([place]) 03/12/2335  . Endometriosis 09/14/2012  . Hypertension 11/23/2003  . Unspecified constipation 06/24/2004    Patient Active Problem List   Diagnosis Date Noted  . Chronic pelvic pain in female 05/06/2020  . Anxiety 07/01/2016  . Wrist pain 06/24/2016  . Back pain 06/03/2016  . Abdominal pain, left lower quadrant 12/17/2015  . History of colonic polyps 08/07/2015  . Diverticulitis 05/28/2015  . Seborrheic keratoses 01/30/2015  . Hyperlipidemia 01/30/2015  . Generalized anxiety disorder 07/04/2014  . Diabetes mellitus without complication ([place]) 08/02/4974  . Depression, major, recurrent, moderate ([place]) 07/31/2005  . Hypertension 11/23/2003  . Endometriosis 03/28/1996    Past Surgical History:  Procedure Laterality Date  . APPENDECTOMY  1996   Dr. Lindon Romp  . BREAST SURGERY Bilateral 1996   Dr. Isaiah Blakes  . INGUINAL HERNIA REPAIR    . OVARIAN CYST REMOVAL      OB History   No obstetric history on file.      Home Medications    Prior to Admission medications   Medication Sig Start Date End Date Taking? Authorizing Provider  Acetaminophen (MIDOL PO) Take by mouth.    [provider]  ALPRAZolam Duanne Moron) 0.25 MG tablet Take 1 tablet (0.25 mg total) by mouth daily as needed for anxiety. 06/28/20   [name], MD  Cholecalciferol (VITAMIN D3) 125 MCG (5000 UT) CHEW Chew by mouth.    [provider]  methocarbamol (  ROBAXIN) 500 MG tablet TAKE 1 TABLET BY MOUTH EVERY 6 HOURS AS NEEDED FOR MUSCLE SPASMS. 10/16/20   Marny Lowenstein A, NP  Multiple Vitamins-Minerals (ADVANCED DIABETIC MULTIVITAMIN PO) Take by mouth. Take 1 packet once daily.    [provider]    Family History Family History   Problem Relation Age of Onset  . Diabetes Mother   . Hypertension Mother   . Arthritis Mother        RA  . Pancreatic cancer Father   . Stomach cancer Neg Hx   . Colon cancer Neg Hx     Social History Social History   Tobacco Use  . Smoking status: Never Smoker  . Smokeless tobacco: Never Used  Vaping Use  . Vaping Use: Never used  Substance Use Topics  . Alcohol use: No  . Drug use: No     Allergies   Aspirin, Darvocet [propoxyphene n-acetaminophen], Erythromycin, Gabapentin, Ibuprofen, and Metformin and related   Review of Systems Review of Systems  Constitutional: Negative for appetite change, chills, diaphoresis, fever and unexpected weight change.  HENT: Negative for congestion, ear pain, sinus pressure, sinus pain, sneezing, sore throat and trouble swallowing.   Respiratory: Negative for cough, chest tightness and shortness of breath.   Cardiovascular: Negative for chest pain.  Gastrointestinal: Positive for abdominal pain. Negative for abdominal distention, anal bleeding, blood in stool, constipation, diarrhea, nausea, rectal pain and vomiting.  Genitourinary: Negative for dysuria, flank pain, frequency and urgency.  Musculoskeletal: Positive for back pain. Negative for myalgias.  Neurological: Negative for dizziness, light-headedness and headaches.  All other systems reviewed and are negative.    Physical Exam Triage Vital Signs ED Triage Vitals  Enc Vitals Group     BP      Pulse      Resp      Temp      Temp src      SpO2      Weight      Height      Head Circumference      Peak Flow      Pain Score      Pain Loc      Pain Edu?      Excl. in [place]?    No data found.  Updated Vital Signs BP (S) (!) 187/98 (BP Location: Right Arm) Comment: baseline 125/75, Mickel Baas, PA notified  Pulse 92   Temp 98.6 F (37 C) (Oral)   Resp 16   Wt 116 lb (52.6 kg)   LMP 11/16/2014 (Approximate)   SpO2 100%   BMI 19.30 kg/m   Visual Acuity Right Eye  Distance:   Left Eye Distance:   Bilateral Distance:    Right Eye Near:   Left Eye Near:    Bilateral Near:     Physical Exam Vitals reviewed.  Constitutional:      General: She is not in acute distress.    Appearance: Normal appearance. She is not ill-appearing.  HENT:     Head: Normocephalic and atraumatic.     Mouth/Throat:     Mouth: Mucous membranes are moist.     Comments: Moist mucous membranes Eyes:     Extraocular Movements: Extraocular movements intact.     Pupils: Pupils are equal, round, and reactive to light.  Cardiovascular:     Rate and Rhythm: Normal rate and regular rhythm.     Heart sounds: Normal heart sounds.  Pulmonary:     Effort: Pulmonary effort is normal.  Breath sounds: Normal breath sounds. No wheezing, rhonchi or rales.  Abdominal:     General: Bowel sounds are normal. There is no distension.     Palpations: Abdomen is soft. There is no mass.     Tenderness: There is generalized abdominal tenderness and tenderness in the right upper quadrant and left upper quadrant. There is no right CVA tenderness, left CVA tenderness, guarding or rebound.     Hernia: No hernia is present.     Comments: Generalized abd pain to soft palpation, worse in RUQ and LUQ. No rebound.  Skin:    General: Skin is warm.     Capillary Refill: Capillary refill takes less than 2 seconds.     Comments: Good skin turgor  Neurological:     General: No focal deficit present.     Mental Status: She is alert and oriented to person, place, and time.  Psychiatric:        Mood and Affect: Mood normal.        Behavior: Behavior normal.      UC Treatments / Results  Labs (all labs ordered are listed, but only abnormal results are displayed) Labs Reviewed  POCT URINALYSIS DIPSTICK, ED / UC - Abnormal; Notable for the following components:      Result Value   Glucose, UA >=1000 (*)    Hgb urine dipstick TRACE (*)    Protein, ur 100 (*)    All other components within normal  limits  URINE CULTURE    EKG   Radiology No results found.  Procedures Procedures (including critical care time)  Medications Ordered in UC Medications - No data to display  Initial Impression / Assessment and Plan / UC Course  I have reviewed the triage vital signs and the nursing notes.  Pertinent labs & imaging results that were available during my care of the patient were reviewed by me and considered in my medical decision making (see chart for details).     This patient is a 60 year old female presenting with worsening of chronic abdominal pain. Today this pt is afebrile nontachycardic nontachypneic, oxygenating well on room air, no wheezes rhonchi or rales. This patient has a long history of chronic pelvic pain and fibroids, followed by GYN and pelvic floor PT for this.  Current regimen of Robaxin and Midol providing no relief.  UA today with trace blood, otherwise wnl. Culture sent, but I have low suspicion for UTI/pyelo. Denies STI risk.  For elevated blood pressure, no red flag symptoms.  Recommend rechecking this at home, follow-up with primary care if this continues to be 140/90 at home.  I suspect that this patient is suffering from acute exacerbation of chronic abdominal pain, but I am not able to rule out cholecystitis, pancreatitis.  For this reason I am sending her to [facility] emergency room for further evaluation and management of abdominal pain.  Patient verbalizes understanding and agreement.  She is hemodynamically stable for transport and proceeding with this time.  This chart was dictated using voice recognition software, Dragon. Despite the best efforts of this provider to proofread and correct errors, errors may still occur which can change documentation meaning.   Final Clinical Impressions(s) / UC Diagnoses   Final diagnoses:  Left upper quadrant abdominal pain  Chronic abdominal pain  Chronic pelvic pain in female  Elevated blood pressure reading  without diagnosis of hypertension     Discharge Instructions     -Head straight to [facility] emergency room for  further evaluation and management of your abdominal pain.  If you experience worsening of symptoms on the way, like worsening pain, dizziness, chest pain, shortness of breath-stop and call 911 immediately.     ED Prescriptions    None     PDMP not reviewed this encounter.   Hazel Sams, PA-C 11/07/20 1219

## 2020-11-09 LAB — URINE CULTURE: Culture: >=100000 — AB

## 2020-11-11 ENCOUNTER — Telehealth: Payer: Self-pay | Admitting: *Deleted

## 2020-11-11 ENCOUNTER — Telehealth (HOSPITAL_COMMUNITY): Payer: Self-pay | Admitting: Emergency Medicine

## 2020-11-11 MED ORDER — SULFAMETHOXAZOLE-TRIMETHOPRIM 800-160 MG PO TABS: 1.0000 | ORAL_TABLET | Freq: Two times a day (BID) | ORAL | 0 refills | Status: AC

## 2020-11-11 NOTE — Telephone Encounter (Signed)
Patient called and left message stating had to go to the ER on 11/07/20 due to abdominal pain. The ER doctor told her to follow up with Dr. Ardis Hughs, has seen in past. Patient called asking if referral is needed, I explained no referral should be needed because the notes are in the chart along with imaging as well. Patient said she will call GI to schedule office visit.

## 2020-11-14 ENCOUNTER — Encounter: Payer: Self-pay | Admitting: Physical Therapy

## 2020-11-15 ENCOUNTER — Ambulatory Visit (INDEPENDENT_AMBULATORY_CARE_PROVIDER_SITE_OTHER): Payer: 59 | Admitting: Physician Assistant

## 2020-11-15 ENCOUNTER — Encounter: Payer: Self-pay | Admitting: Physician Assistant

## 2020-11-15 VITALS — BP 130/90 | HR 80 | Ht 64.5 in | Wt 109.0 lb

## 2020-11-15 DIAGNOSIS — R1084 Generalized abdominal pain: Secondary | ICD-10-CM

## 2020-11-15 DIAGNOSIS — R935 Abnormal findings on diagnostic imaging of other abdominal regions, including retroperitoneum: Secondary | ICD-10-CM | POA: Diagnosis not present

## 2020-11-15 MED ORDER — OMEPRAZOLE 20 MG PO CPDR: 20.0000 mg | DELAYED_RELEASE_CAPSULE | Freq: Two times a day (BID) | ORAL | 3 refills | Status: DC

## 2020-11-15 MED ORDER — NA SULFATE-K SULFATE-MG SULF 17.5-3.13-1.6 GM/177ML PO SOLN: 1.0000 | Freq: Once | ORAL | 0 refills | Status: AC

## 2020-11-15 NOTE — Progress Notes (Signed)
Chief Complaint: Follow-up ER visit for abdominal pain  HPI:    Brooke [name] is a 60 year old female, known to Dr. Ardis Hughs, with a past medical history as listed below including diabetes, who presents to clinic today for follow-up after being seen in the ER for abdominal pain.    04/2015 colonoscopy Dr. Ardis Hughs with one 20 mm pedunculated polyp and diverticulosis.  Pathology showed adenoma.  Repeat was recommended in 3 years    06/28/2020 gynecology note shows it history of endometriosis with chronic pelvic pain for greater than 20 years.  3 surgeries for this and is seen pelvic floor specialist in [facility] with treatments to include Cymbalta, gabapentin, muscle relaxers, narcotics and anti-inflammatories.  Currently on Midol and Zanaflex.  History of ovarian cyst with 2 cystectomies.  Ultrasound 01/2019 showed fibroids, unable to visualize endometrial thickness.      11/07/2020 patient initially presented to the urgent care with abdominal and back pain.  Described a history of chronic abdominal pain, pelvic pain and fibroids followed by GYN with pelvic floor weakness and endometriosis.  She had been going to physical therapy for pelvic floor weakness and pain but thought that was making her pelvic pain worse instead of better.  Describes 3 months of diffuse abdominal pain radiating to her pelvis as well as left upper quadrant pain radiating to her back and right upper quadrant.  Pain is worse with movement and eating.  Urinalysis and urine culture were normal.  She was sent to the [facility], ER to rule out cholecystitis and pancreatitis.    11/07/2020 ER visit for abdominal pain.  CT the abdomen pelvis showed minimal distal esophageal wall thickening which can be seen with reflux or esophagitis and mild descending colonic diverticulosis without diverticulitis.  Bulbous uterus with fibroids, less well-defined.  Aortic atherosclerosis.  She was given Bentyl 20 mg twice daily and Pepcid 20 mg twice daily.      Today, the patient tells me that she continues with abdominal pain.  She has chronic pelvic pain as above but has developed a new left upper quadrant pain over the past month which seems to radiate across to her right upper quadrant and into her back.  This is rated as an 8/10.  She thought initially this was maybe worse with movement because she was going through a lot of his physical therapy and thought she maybe stretched this area wrong, but now it is just pretty constant.  She has tried pain medications, a TENS unit, massage and heating pad but nothing helps.  Tells me the Bentyl 20 mg twice a day and the Pepcid 20 mg twice a day have changed nothing.  Denies any overt heartburn or reflux symptoms. Tells me she radiates back-and-forth from diarrhea to constipation with her chronic pelvic pain.  Does not think that the pain is any worse when she is constipated.    Denies fever, chills, weight loss, blood in her stool or symptoms that awaken her from sleep.  Past Medical History:  Diagnosis Date  . Abdominal pain, left lower quadrant 12/17/2015  . Chest pain, unspecified 10/05/2003  . Depressive disorder, not elsewhere classified 07/31/2005  . Diabetes mellitus without complication ([place]) 25/36/6440  . Endometriosis 09/14/2012  . Hypertension 11/23/2003  . Unspecified constipation 06/24/2004    Past Surgical History:  Procedure Laterality Date  . APPENDECTOMY  1996   Dr. Lindon Romp  . BREAST SURGERY Bilateral 1996   Dr. Isaiah Blakes  . INGUINAL HERNIA REPAIR    .  OVARIAN CYST REMOVAL      Current Outpatient Medications  Medication Sig Dispense Refill  . acetaminophen (TYLENOL) 500 MG tablet Take 1,000 mg by mouth every 6 (six) hours as needed for mild pain, fever or headache.    . ALPRAZolam (XANAX) 0.25 MG tablet Take 1 tablet (0.25 mg total) by mouth daily as needed for anxiety. (Patient not taking: Reported on 11/07/2020) 30 tablet 0  . Aspirin-Acetaminophen-Caffeine (PAMPRIN MAX PO) Take 1  tablet by mouth 2 (two) times daily as needed (pelvic pain).    . cholecalciferol (VITAMIN D3) 25 MCG (1000 UNIT) tablet Take 4,000 Units by mouth daily.    Marland Kitchen dicyclomine (BENTYL) 20 MG tablet Take 1 tablet (20 mg total) by mouth 2 (two) times daily. 20 tablet 0  . famotidine (PEPCID) 20 MG tablet Take 1 tablet (20 mg total) by mouth 2 (two) times daily. 30 tablet 0  . methocarbamol (ROBAXIN) 500 MG tablet TAKE 1 TABLET BY MOUTH EVERY 6 HOURS AS NEEDED FOR MUSCLE SPASMS. (Patient taking differently: Take 500 mg by mouth every 6 (six) hours as needed for muscle spasms.) 30 tablet 1   No current facility-administered medications for this visit.    Allergies as of 11/15/2020 - Review Complete 11/11/2020  Allergen Reaction Noted  . Aspirin  11/08/2011  . Darvocet [propoxyphene n-acetaminophen]  11/08/2011  . Erythromycin  03/28/2013  . Gabapentin Other (See Comments) 11/07/2020  . Ibuprofen  03/28/2013  . Metformin and related Nausea Only 07/01/2016    Family History  Problem Relation Age of Onset  . Diabetes Mother   . Hypertension Mother   . Arthritis Mother        RA  . Pancreatic cancer Father   . Stomach cancer Neg Hx   . Colon cancer Neg Hx     Social History   Socioeconomic History  . Marital status: Married    Spouse name: Not on file  . Number of children: Not on file  . Years of education: Not on file  . Highest education level: Not on file  Occupational History  . Not on file  Tobacco Use  . Smoking status: Never Smoker  . Smokeless tobacco: Never Used  Vaping Use  . Vaping Use: Never used  Substance and Sexual Activity  . Alcohol use: No  . Drug use: No  . Sexual activity: Not Currently  Other Topics Concern  . Not on file  Social History Narrative  . Not on file   Social Determinants of Health   Financial Resource Strain: Not on file  Food Insecurity: Not on file  Transportation Needs: Not on file  Physical Activity: Not on file  Stress: Not on  file  Social Connections: Not on file  Intimate Partner Violence: Not on file    Review of Systems:    Constitutional: No weight loss, fever or chills Skin: No rash  Cardiovascular: No chest pain Respiratory: No SOB  Gastrointestinal: See HPI and otherwise negative Genitourinary: No dysuria Neurological: No headache, dizziness or syncope Musculoskeletal: No new muscle or joint pain Hematologic: No bleeding Psychiatric: No history of depression or anxiety   Physical Exam:  Vital signs: BP 130/90   Pulse 80   Ht 5' 4.5" (1.638 m)   Wt 109 lb (49.4 kg)   LMP 11/16/2014 (Approximate)   BMI 18.42 kg/m   Constitutional:   Pleasant thin appearing AA female appears to be in NAD, Well developed, Well nourished, alert and cooperative Head:  Normocephalic and  atraumatic. Eyes:   PEERL, EOMI. No icterus. Conjunctiva pink. Ears:  Normal auditory acuity. Neck:  Supple Throat: Oral cavity and pharynx without inflammation, swelling or lesion.  Respiratory: Respirations even and unlabored. Lungs clear to auscultation bilaterally.   No wheezes, crackles, or rhonchi.  Cardiovascular: Normal S1, S2. No MRG. Regular rate and rhythm. No peripheral edema, cyanosis or pallor.  Gastrointestinal:  Soft, nondistended, marked upper abdominal TTP bilaterally with involuntary guarding. Normal bowel sounds. No appreciable masses or hepatomegaly. Rectal:  Not performed.  Msk:  Symmetrical without gross deformities. Without edema, no deformity or joint abnormality.  Neurologic:  Alert and  oriented x4;  grossly normal neurologically.  Skin:   Dry and intact without significant lesions or rashes. Psychiatric: Demonstrates good judgement and reason without abnormal affect or behaviors.  RELEVANT LABS AND IMAGING: CBC    Component Value Date/Time   WBC 7.9 11/07/2020 1312   RBC 5.29 (H) 11/07/2020 1312   HGB 14.4 11/07/2020 1312   HCT 42.5 11/07/2020 1312   PLT 289 11/07/2020 1312   MCV 80.3 11/07/2020  1312   MCH 27.2 11/07/2020 1312   MCHC 33.9 11/07/2020 1312   RDW 12.6 11/07/2020 1312   LYMPHSABS 2,925 02/01/2017 1628   MONOABS 375 02/01/2017 1628   EOSABS 75 02/01/2017 1628   BASOSABS 0 02/01/2017 1628    CMP     Component Value Date/Time   NA 140 11/07/2020 1312   NA 136 12/13/2015 0856   K 4.6 11/07/2020 1312   CL 104 11/07/2020 1312   CO2 26 11/07/2020 1312   GLUCOSE 274 (H) 11/07/2020 1312   BUN 8 11/07/2020 1312   BUN 7 12/13/2015 0856   CREATININE 0.59 11/07/2020 1312   CREATININE 0.75 02/01/2017 1628   CALCIUM 9.6 11/07/2020 1312   PROT 8.4 (H) 11/07/2020 1312   PROT 7.4 03/28/2013 1511   ALBUMIN 4.0 11/07/2020 1312   ALBUMIN 4.5 03/28/2013 1511   AST 12 (L) 11/07/2020 1312   ALT 10 11/07/2020 1312   ALKPHOS 85 11/07/2020 1312   BILITOT 0.5 11/07/2020 1312   GFRNONAA >60 11/07/2020 1312   GFRNONAA >89 02/01/2017 1628   GFRAA >89 02/01/2017 1628    Assessment: 1.  Generalized abdominal pain: Chronic pelvic pain, followed by Gyn, new upper abdominal pain and a CT showing possible esophagitis with no change after Pepcid and Bentyl for a few weeks; consider gastritis versus other 2.  History of adenomatous polyps: Last colonoscopy in 2016 with recommendations for repeat in 3 years, patient is overdue  Plan: 1.  Scheduled patient for diagnostic EGD and surveillance colonoscopy in the [place] with Dr. Ardis Hughs.  Did provide the patient with a detailed list of risks for the procedures and she agrees to proceed. 2.  Continue Bentyl 20 mg twice daily for now. 3.  Discontinue Pepcid.  Prescribed Omeprazole 20 mg twice daily, 30-60 minutes before breakfast and dinner #60 with 3 refills. 4.  Patient to follow in clinic per recommendations from Dr. Ardis Hughs after time of procedures.  Ellouise Newer, PA-C [facility] 11/15/2020, 11:12 AM  Cc: No ref. provider found

## 2020-11-15 NOTE — Progress Notes (Signed)
I agree with the above note, plan 

## 2020-11-15 NOTE — Patient Instructions (Signed)
If you are age 58 or older, your body mass index should be between 23-30. Your Body mass index is 18.42 kg/m. If this is out of the aforementioned range listed, please consider follow up with your Primary Care Provider.  If you are age 49 or younger, your body mass index should be between 19-25. Your Body mass index is 18.42 kg/m. If this is out of the aformentioned range listed, please consider follow up with your Primary Care Provider.   You have been scheduled for an endoscopy and colonoscopy. Please follow the written instructions given to you at your visit today. Please pick up your prep supplies at the pharmacy within the next 1-3 days. If you use inhalers (even only as needed), please bring them with you on the day of your procedure.  Thank you for choosing me and [facility].  Ellouise Newer, PA-C

## 2020-11-26 ENCOUNTER — Telehealth: Payer: Self-pay | Admitting: Physician Assistant

## 2020-11-26 DIAGNOSIS — R935 Abnormal findings on diagnostic imaging of other abdominal regions, including retroperitoneum: Secondary | ICD-10-CM

## 2020-11-26 DIAGNOSIS — R1084 Generalized abdominal pain: Secondary | ICD-10-CM

## 2020-11-26 MED ORDER — OMEPRAZOLE 40 MG PO CPDR: 40.0000 mg | DELAYED_RELEASE_CAPSULE | Freq: Two times a day (BID) | ORAL | 3 refills | Status: DC

## 2020-11-26 MED ORDER — AMBULATORY NON FORMULARY MEDICATION: 1 refills | Status: DC

## 2020-11-26 MED ORDER — DICYCLOMINE HCL 20 MG PO TABS: 20.0000 mg | ORAL_TABLET | Freq: Three times a day (TID) | ORAL | 3 refills | Status: DC

## 2020-11-26 NOTE — Telephone Encounter (Signed)
Spoke with patient in regards to medications. Patient is aware that I will send in new prescriptions for the updated dosages, she is aware that I will send the prescription for GI cocktail to [facility]. Patient will try this for the next week to see if this helps and if not she will call and let us know. At that time it will be determined if we need to get her in for a sooner EGD appt. Patient verbalized understanding and had no concerns at the end of the call.   Prescriptions sent to pharmacy on file - pt confirmed. Verbal order for GI cocktail called into [facility].

## 2020-11-26 NOTE — Telephone Encounter (Signed)
Pt states that sxs have worsened since she was since here. She reports to feel extremelly weak, her abdomen is very tender to the touch and distented. She thinks that she has an infection somewhere in her body. She would like something prescribed. She stated that omeprazole has not helped.

## 2020-11-26 NOTE — Telephone Encounter (Signed)
Spoke with patient, she reports continued upper abdominal pain. She is eating small portions of food and trying to drink at least 96 oz of water a day. Patient states that she finished the bentyl and it did not provide any relief. She reports taking Omeprazole as directed with no relief as well. Patient states that her upper abdomen is extremely tender to the touch and she feels like if she moves wrong way she will be in severe pain. Patient states that she feels bloated but she isn't. Patient seeking further advice, her procedures are not scheduled until 01/28/21 with Dr. Ardis Hughs. Please advise, thanks.

## 2020-11-26 NOTE — Telephone Encounter (Signed)
Lets increase her omeprazole to 40 mg twice daily.  #60 refill x3 She can also use her Bentyl 20 mg scheduled 4 times daily 20 to 30 minutes before meals and at bedtime. #120 refillx3  Can also send in a GI cocktail for her to use 5-10 mL every 4-6 hours for abdominal pain.  Refill x1 Tell her lets try this over the next week and see if this helps at all.  She should contact us if it does not.  At that point could look into just doing the EGD by itself first for further evaluation.  Thanks, JLL

## 2020-12-03 NOTE — Telephone Encounter (Signed)
Spoke with patient, she states that the GI cocktail made her nauseous.  Patient states that she is having continued left sided epigastric pain, describes as a sharp pain, almost looks like a muscle spasm. After cocktail she was having diarrhea also. Denies any heavy lifting or extraneous activity. Patient states that she increased all medications as directed before and has had no relief. Patient states that on her last colonoscopy she was told that she had diverticulosis and wanted to know if it could be that causing her symptoms. Patient is still attending pelvic floor PT. Patient states that she has been trying to hold out until her procedures but contiues to have pain. Please advise, thanks

## 2020-12-03 NOTE — Telephone Encounter (Signed)
Lm on vm for patient to return call 

## 2020-12-03 NOTE — Addendum Note (Signed)
Addended by: Yevette Edwards on: 12/03/2020 02:03 PM   Modules accepted: Orders

## 2020-12-03 NOTE — Telephone Encounter (Signed)
Spoke with patient, she has been scheduled for an EGD with Dr. Ardis Hughs on Wednesday, 12/11/20 at 10 AM. She is aware that she needs to arrive on the 4th floor at 9 AM with a care partner. Patient is ware that she will need to be NPO after midnight the night before. Patient verbalized understanding and had no concerns at the end of the call.

## 2020-12-08 DEATH — deceased

## 2020-12-11 ENCOUNTER — Ambulatory Visit (AMBULATORY_SURGERY_CENTER): Payer: 59 | Admitting: Gastroenterology

## 2020-12-11 ENCOUNTER — Other Ambulatory Visit: Payer: Self-pay

## 2020-12-11 ENCOUNTER — Encounter: Payer: Self-pay | Admitting: Gastroenterology

## 2020-12-11 VITALS — BP 160/91 | HR 80 | Temp 97.8°F | Resp 12 | Ht 64.5 in | Wt 109.0 lb

## 2020-12-11 DIAGNOSIS — K319 Disease of stomach and duodenum, unspecified: Secondary | ICD-10-CM

## 2020-12-11 DIAGNOSIS — R935 Abnormal findings on diagnostic imaging of other abdominal regions, including retroperitoneum: Secondary | ICD-10-CM

## 2020-12-11 DIAGNOSIS — K2970 Gastritis, unspecified, without bleeding: Secondary | ICD-10-CM

## 2020-12-11 MED ORDER — SODIUM CHLORIDE 0.9 % IV SOLN
500.0000 mL | Freq: Once | INTRAVENOUS | Status: DC
Start: 2020-12-11 — End: 2020-12-11

## 2020-12-11 NOTE — Progress Notes (Signed)
Report given to PACU, vss 

## 2020-12-11 NOTE — Progress Notes (Signed)
BP 179/105, Labetalol given IV, MD update, vss

## 2020-12-11 NOTE — Progress Notes (Signed)
Called to room to assist during endoscopic procedure.  Patient ID and intended procedure confirmed with present staff. Received instructions for my participation in the procedure from the performing physician.  

## 2020-12-11 NOTE — Progress Notes (Signed)
0948 Robinul 0.1 mg IV given due large amount of secretions upon assessment.  MD made aware, vss  

## 2020-12-11 NOTE — Patient Instructions (Signed)
Please read handouts provided. Continue present medications. Await pathology results.    YOU HAD AN ENDOSCOPIC PROCEDURE TODAY AT THE [facility]:   Refer to the procedure report that was given to you for any specific questions about what was found during the examination.  If the procedure report does not answer your questions, please call your gastroenterologist to clarify.  If you requested that your care partner not be given the details of your procedure findings, then the procedure report has been included in a sealed envelope for you to review at your convenience later.  YOU SHOULD EXPECT: Some feelings of bloating in the abdomen. Passage of more gas than usual.  Walking can help get rid of the air that was put into your GI tract during the procedure and reduce the bloating. If you had a lower endoscopy (such as a colonoscopy or flexible sigmoidoscopy) you may notice spotting of blood in your stool or on the toilet paper. If you underwent a bowel prep for your procedure, you may not have a normal bowel movement for a few days.  Please Note:  You might notice some irritation and congestion in your nose or some drainage.  This is from the oxygen used during your procedure.  There is no need for concern and it should clear up in a day or so.  SYMPTOMS TO REPORT IMMEDIATELY:    Following upper endoscopy (EGD)  Vomiting of blood or coffee ground material  New chest pain or pain under the shoulder blades  Painful or persistently difficult swallowing  New shortness of breath  Fever of 100F or higher  Black, tarry-looking stools  For urgent or emergent issues, a gastroenterologist can be reached at any hour by calling [phone]. Do not use MyChart messaging for urgent concerns.    DIET:  We do recommend a small meal at first, but then you may proceed to your regular diet.  Drink plenty of fluids but you should avoid alcoholic beverages for 24 hours.  ACTIVITY:  You  should plan to take it easy for the rest of today and you should NOT DRIVE or use heavy machinery until tomorrow (because of the sedation medicines used during the test).    FOLLOW UP: Our staff will call the number listed on your records 48-72 hours following your procedure to check on you and address any questions or concerns that you may have regarding the information given to you following your procedure. If we do not reach you, we will leave a message.  We will attempt to reach you two times.  During this call, we will ask if you have developed any symptoms of COVID 19. If you develop any symptoms (ie: fever, flu-like symptoms, shortness of breath, cough etc.) before then, please call [phone].  If you test positive for Covid 19 in the 2 weeks post procedure, please call and report this information to us.    If any biopsies were taken you will be contacted by phone or by letter within the next 1-3 weeks.  Please call us at [phone] if you have not heard about the biopsies in 3 weeks.    SIGNATURES/CONFIDENTIALITY: You and/or your care partner have signed paperwork which will be entered into your electronic medical record.  These signatures attest to the fact that that the information above on your After Visit Summary has been reviewed and is understood.  Full responsibility of the confidentiality of this discharge information lies with you and/or your care-partner. 

## 2020-12-11 NOTE — Op Note (Signed)
[facility] Patient Name: Brooke [name] Procedure Date: 12/11/2020 9:47 AM MRN: [id number] Endoscopist: Milus Banister , MD Age: 60 Referring MD:  Date of Birth: [birth year] Gender: Female Account #: [id number] Procedure:                Upper GI endoscopy Indications:              Lower abdominal pain, Upper abdominal pain Medicines:                Monitored Anesthesia Care Procedure:                Pre-Anesthesia Assessment:                           - Prior to the procedure, a History and Physical                            was performed, and patient medications and                            allergies were reviewed. The patient's tolerance of                            previous anesthesia was also reviewed. The risks                            and benefits of the procedure and the sedation                            options and risks were discussed with the patient.                            All questions were answered, and informed consent                            was obtained. Prior Anticoagulants: The patient has                            taken no previous anticoagulant or antiplatelet                            agents. ASA Grade Assessment: III - A patient with                            severe systemic disease. After reviewing the risks                            and benefits, the patient was deemed in                            satisfactory condition to undergo the procedure.                           After obtaining informed consent, the endoscope was  passed under direct vision. Throughout the                            procedure, the patient's blood pressure, pulse, and                            oxygen saturations were monitored continuously. The                            Endoscope was introduced through the mouth, and                            advanced to the second part of duodenum. The upper                            GI  endoscopy was accomplished without difficulty.                            The patient tolerated the procedure well. Scope In: Scope Out: Findings:                 Mild inflammation characterized by erythema and                            granularity was found in the gastric antrum.                            Biopsies were taken with a cold forceps for                            histology.                           The exam was otherwise without abnormality. Complications:            No immediate complications. Estimated blood loss:                            None. Estimated Blood Loss:     Estimated blood loss: none. Impression:               - Mild, non-specific gastritis. Biopsies taken to                            check for H. pylori.                           - The examination was otherwise normal. Recommendation:           - Patient has a contact number available for                            emergencies. The signs and symptoms of potential                            delayed complications were discussed with the  patient. Return to normal activities tomorrow.                            Written discharge instructions were provided to the                            patient.                           - Resume previous diet.                           - Continue present medications.                           - Await pathology results. Milus Banister, MD 12/11/2020 10:00:07 AM This report has been signed electronically.

## 2020-12-11 NOTE — Progress Notes (Signed)
Pt's states no medical or surgical changes since previsit or office visit. 

## 2020-12-13 ENCOUNTER — Telehealth: Payer: Self-pay

## 2020-12-13 NOTE — Telephone Encounter (Signed)
LVM

## 2020-12-20 ENCOUNTER — Encounter: Payer: Self-pay | Admitting: Gastroenterology

## 2020-12-24 ENCOUNTER — Telehealth: Payer: Self-pay | Admitting: Gastroenterology

## 2020-12-24 NOTE — Telephone Encounter (Signed)
The pt has been advised and aware she will get a letter in the mail   Dec 20, 2020   Brooke [name] [address] [place] [address]   Dear Ms. Bayus,  The biopsies taken during your recent upper endoscopy showed no sign of infection, serious inflammation or cancer.  You should continue to follow the recommendations that we discussed at the time of your procedure.  If you have any questions or concerns, please don't hesitate to call.  Sincerely,

## 2020-12-24 NOTE — Telephone Encounter (Signed)
Patient called requesting for path results

## 2020-12-25 ENCOUNTER — Telehealth: Payer: Self-pay | Admitting: Gastroenterology

## 2020-12-25 NOTE — Telephone Encounter (Signed)
The pt was actually calling for path results.  She was told that a letter was mailed. I did read it to her over the phone and all questions answered.

## 2021-01-24 ENCOUNTER — Telehealth: Payer: Self-pay | Admitting: Gastroenterology

## 2021-01-24 NOTE — Telephone Encounter (Signed)
Hey Dr. Ardis Hughs,   Patient cancelled procedure 01/28/21 due to insurance issues. Rescheduled for 8/10.

## 2021-01-28 ENCOUNTER — Encounter: Payer: 59 | Admitting: Gastroenterology

## 2021-02-13 ENCOUNTER — Other Ambulatory Visit: Payer: Self-pay | Admitting: Physician Assistant

## 2021-02-14 ENCOUNTER — Telehealth: Payer: Self-pay

## 2021-02-14 NOTE — Telephone Encounter (Addendum)
Brooke [name] is a 60 y.o. female scheduled for a new pt appt 03/17/21 at 10:40am

## 2021-02-22 ENCOUNTER — Other Ambulatory Visit: Payer: Self-pay | Admitting: Physician Assistant

## 2021-03-17 ENCOUNTER — Encounter: Payer: Self-pay | Admitting: Obstetrics and Gynecology

## 2021-03-17 ENCOUNTER — Other Ambulatory Visit: Payer: Self-pay

## 2021-03-17 ENCOUNTER — Ambulatory Visit (INDEPENDENT_AMBULATORY_CARE_PROVIDER_SITE_OTHER): Payer: 59 | Admitting: Obstetrics and Gynecology

## 2021-03-17 VITALS — BP 170/93 | HR 90 | Ht 64.5 in | Wt 106.0 lb

## 2021-03-17 DIAGNOSIS — N3001 Acute cystitis with hematuria: Secondary | ICD-10-CM

## 2021-03-17 DIAGNOSIS — R319 Hematuria, unspecified: Secondary | ICD-10-CM

## 2021-03-17 DIAGNOSIS — N950 Postmenopausal bleeding: Secondary | ICD-10-CM

## 2021-03-17 DIAGNOSIS — M62838 Other muscle spasm: Secondary | ICD-10-CM

## 2021-03-17 DIAGNOSIS — N3941 Urge incontinence: Secondary | ICD-10-CM

## 2021-03-17 DIAGNOSIS — N952 Postmenopausal atrophic vaginitis: Secondary | ICD-10-CM

## 2021-03-17 LAB — POCT URINALYSIS DIPSTICK
Appearance: NORMAL
Bilirubin, UA: NEGATIVE
Glucose, UA: POSITIVE — AB
Ketones, UA: NEGATIVE
Leukocytes, UA: NEGATIVE
Nitrite, UA: NEGATIVE
Protein, UA: POSITIVE — AB
Spec Grav, UA: >=1.03 — AB (ref 1.010–1.025)
Urobilinogen, UA: 0.2 E.U./dL
pH, UA: 5 (ref 5.0–8.0)

## 2021-03-17 MED ORDER — ESTRADIOL 0.1 MG/GM VA CREA: 0.5000 g | TOPICAL_CREAM | VAGINAL | 11 refills | Status: DC

## 2021-03-17 MED ORDER — DIAZEPAM 5 MG PO TABS: ORAL_TABLET | ORAL | 0 refills | Status: DC

## 2021-03-17 NOTE — Progress Notes (Signed)
[facility] New Patient Evaluation and Consultation  Referring Provider: No ref. provider found PCP: Patient, No Pcp Per (Inactive) Date of Service: 03/17/2021  SUBJECTIVE Chief Complaint: New Patient (Initial Visit)- pelvic pain  History of Present Illness: Brooke [name] is a 60 y.o. Black or African-American female for evaluation of pelvic pain.     Urinary Symptoms: Leaks urine with cough/ sneeze, with a full bladder, with movement to the bathroom, and without sensation Leaks 1-5 time(s) per day. Other times not at all. Sometimes would have a large gush of urine.  Pad use: 1 liners/ mini-pads per day.   She is bothered by her UI symptoms.  Day time voids 8.  Nocturia: 4-5 times per night to void. Voiding dysfunction: she empties her bladder well.  does not use a catheter to empty bladder.  When urinating, she feels she has no difficulties Drinks: only water   UTIs:  0  UTI's in the last year.   Denies history of blood in urine and kidney or bladder stones  Pelvic Organ Prolapse Symptoms:                  She Denies a feeling of a bulge the vaginal area.   Bowel Symptom: Bowel movements: every 2-3 days Stool consistency: hard, soft , or loose Straining: yes.  Splinting: yes.  Incomplete evacuation: yes.  She Admits to accidental bowel leakage / fecal incontinence  Occurs: 3-4 time(s) per month  Consistency with leakage: soft  or liquid Bowel regimen: diet, stool softener, and enemas Last colonoscopy: scheduled 03/19/21  Sexual Function Sexually active: yes.  Pain with sex: Yes, deep in the pelvis, has discomfort due to dryness, and pain with orgasm  Pelvic Pain Admits to pelvic pain. Has been going on several years. Also has history of  Location: lower pelvic pain into back and pain in abdomen on both sides Pain occurs: anytime Prior pain treatment: physical therapy and medications- tizanadine (sedating), robaxin (helped- '500mg'$  q8hrs), vaginal valium  (helped), gabapentin (had bad reaction), cymbalta Improved by: sometimes medication Worsened by: nothing Had several visits of physical therapy.    Past Medical History:  Past Medical History:  Diagnosis Date   Abdominal pain, left lower quadrant 12/17/2015   Chest pain, unspecified 10/05/2003   Depressive disorder, not elsewhere classified 07/31/2005   Diabetes mellitus without complication ([place]) 123456   Endometriosis 09/14/2012   Hypertension 11/23/2003   Unspecified constipation 06/24/2004     Past Surgical History:   Past Surgical History:  Procedure Laterality Date   APPENDECTOMY  1996   Dr. Lindon Romp   BREAST SURGERY Bilateral 1996   Dr. Haskell Riling HERNIA REPAIR     OVARIAN CYST REMOVAL       Past OB/GYN History: G1 P1 Vaginal deliveries: 1,  Forceps/ Vacuum deliveries: 0, Cesarean section: 0 Menopausal: Yes, Admits to vaginal bleeding since menopause. Had a clot pass 2-3 weeks ago.  Last pap smear was several years ago.    Medications: She has a current medication list which includes the following prescription(s): acetaminophen, alprazolam, aspirin-acetaminophen-caffeine, cholecalciferol, diazepam, and estradiol.   Allergies: Patient is allergic to aspirin, darvocet [propoxyphene n-acetaminophen], erythromycin, gabapentin, ibuprofen, and metformin and related.   Social History:  Social History   Tobacco Use   Smoking status: Never   Smokeless tobacco: Never  Vaping Use   Vaping Use: Never used  Substance Use Topics   Alcohol use: No   Drug use: No    Relationship status:  married   Family History:   Family History  Problem Relation Age of Onset   Diabetes Mother    Hypertension Mother    Arthritis Mother        RA   Pancreatic cancer Father    Stomach cancer Neg Hx    Colon cancer Neg Hx    Esophageal cancer Neg Hx    Rectal cancer Neg Hx      Review of Systems: Review of Systems  Constitutional:  Positive for malaise/fatigue.  Negative for fever and weight loss.  Respiratory:  Negative for cough, shortness of breath and wheezing.   Cardiovascular:  Positive for chest pain. Negative for palpitations and leg swelling.  Gastrointestinal:  Positive for abdominal pain. Negative for blood in stool.  Genitourinary:  Negative for dysuria.  Musculoskeletal:  Positive for myalgias.  Skin:  Negative for rash.  Neurological:  Positive for headaches. Negative for dizziness.  Endo/Heme/Allergies:  Does not bruise/bleed easily.  Psychiatric/Behavioral:  Negative for depression. The patient is nervous/anxious.     OBJECTIVE Physical Exam: Vitals:   03/17/21 1042  BP: (!) 170/93  Pulse: 90  Weight: 106 lb (48.1 kg)  Height: 5' 4.5" (1.638 m)    Physical Exam Constitutional:      General: She is not in acute distress. Pulmonary:     Effort: Pulmonary effort is normal.  Abdominal:     General: There is no distension.     Palpations: Abdomen is soft.     Tenderness: There is abdominal tenderness. There is no rebound.     Comments: Tenderness on palpation of rectus muscles, tension noted  Musculoskeletal:        General: No swelling. Normal range of motion.  Skin:    General: Skin is warm and dry.     Findings: No rash.  Neurological:     Mental Status: She is alert and oriented to person, place, and time.  Psychiatric:        Mood and Affect: Mood normal.        Behavior: Behavior normal.     GU / Detailed Urogynecologic Evaluation:  Pelvic Exam: Normal external female genitalia; Bartholin's and Skene's glands normal in appearance; urethral meatus normal in appearance, no urethral masses or discharge.   CST: negative  Speculum exam reveals normal vaginal mucosa with atrophy. Cervix normal appearance. Uterus normal single, nontender. Adnexa no mass, fullness, tenderness.     Pelvic floor strength I/V  Pelvic floor musculature: Right levator tender, Right obturator tender, Left levator tender, Left  obturator tender  POP-Q:   POP-Q  -1                                            Aa   -1                                           Ba  -6                                              C   2.5  Gh  3                                            Pb  9                                            tvl   -3                                            Ap  -3                                            Bp  -8.5                                              D     Rectal Exam:  Normal external rectum  Post-Void Residual (PVR) by Bladder Scan: In order to evaluate bladder emptying, we discussed obtaining a postvoid residual and she agreed to this procedure.  Procedure: The ultrasound unit was placed on the patient's abdomen in the suprapubic region after the patient had voided. A PVR of 0 ml was obtained by bladder scan.  Laboratory Results: POC urine: + protein, moderate blood   ASSESSMENT AND PLAN Ms. Passero is a 60 y.o. with:  1. Levator spasm   2. Vaginal atrophy   3. Postmenopausal bleeding   4. Hematuria, unspecified type   5. Urge incontinence    Levator spasm -The origin of pelvic floor muscle spasm can be multifactorial, including primary, reactive to a different pain source, trauma, or even part of a centralized pain syndrome.Treatment options include pelvic floor physical therapy, local (vaginal) or oral  muscle relaxants, pelvic muscle trigger point injections or centrally acting pain medications.   - She does not want PT at this time since it seemed to cause more pain.  - Will prescribe valium '5mg'$  tab to use vaginally nightly for 2 weeks then as needed after that. May have a component of central sensitization so will consider starting Elavil.  - She would also like to start trigger point injections- will have her return for a serious of 6 weekly  2. Vaginal atrophy - prescribed estrace 0.5g nightly for two weeks then twice  weekly  3. PMB - ordered TVUS to assess uterus  4. Hematuria - unclear if there is a vaginal source although no VB noted on exam today - Will send UA and urine culture. If culture negative will plan for renal US and cystoscopy  5. Urge incontinence - Not as bothersome as her pain symptoms - can reassess after pain improvement   Jaquita Folds, MD   Time spent: I spent 50 minutes dedicated to the care of this patient on the date of this encounter to include pre-visit review of records, face-to-face time with the patient and post visit documentation and ordering medication/ testing.

## 2021-03-18 ENCOUNTER — Telehealth: Payer: Self-pay | Admitting: Gastroenterology

## 2021-03-18 LAB — URINALYSIS, MICROSCOPIC ONLY
Casts: NONE SEEN /lpf
Epithelial Cells (non renal): NONE SEEN /hpf (ref 0–10)
RBC, Urine: NONE SEEN /hpf (ref 0–2)

## 2021-03-18 NOTE — Telephone Encounter (Signed)
Good morning Dr. Ardis Hughs, patient called stating their insurance is currently inactive so they canceled their procedure scheduled for 03/19/2021 until they reactive it.

## 2021-03-19 ENCOUNTER — Encounter: Payer: Self-pay | Admitting: Gastroenterology

## 2021-03-19 ENCOUNTER — Encounter: Payer: 59 | Admitting: Gastroenterology

## 2021-03-20 ENCOUNTER — Telehealth: Payer: Self-pay

## 2021-03-20 LAB — URINE CULTURE

## 2021-03-20 MED ORDER — SULFAMETHOXAZOLE-TRIMETHOPRIM 800-160 MG PO TABS: 1.0000 | ORAL_TABLET | Freq: Two times a day (BID) | ORAL | 0 refills | Status: AC

## 2021-03-20 NOTE — Telephone Encounter (Signed)
-----   Message from Jaquita Folds, MD sent at 03/20/2021  1:24 PM EDT ----- Please let patient know that she has a urinary tract infection and that  have sent her antibiotics (bactrim- take twice a day for 3 days).

## 2021-03-20 NOTE — Telephone Encounter (Signed)
Attempt made to contact Brooke [name] is a 60 y.o. female re: message from Dr. Wannetta Sender below.  Pt was not available.  LM on the VM for the patient to call me back.

## 2021-03-20 NOTE — Addendum Note (Signed)
Addended by: Jaquita Folds on: 03/20/2021 01:24 PM   Modules accepted: Orders

## 2021-03-21 ENCOUNTER — Ambulatory Visit: Payer: Self-pay | Admitting: Obstetrics and Gynecology

## 2021-03-28 ENCOUNTER — Ambulatory Visit: Payer: Self-pay | Admitting: Obstetrics and Gynecology

## 2021-04-02 ENCOUNTER — Ambulatory Visit: Payer: Self-pay

## 2021-04-04 ENCOUNTER — Ambulatory Visit: Payer: Self-pay | Admitting: Obstetrics and Gynecology

## 2021-04-10 ENCOUNTER — Ambulatory Visit: Payer: Self-pay | Admitting: Obstetrics and Gynecology

## 2021-04-18 ENCOUNTER — Ambulatory Visit: Payer: Self-pay | Admitting: Obstetrics and Gynecology

## 2021-04-25 ENCOUNTER — Ambulatory Visit: Payer: Self-pay | Admitting: Obstetrics and Gynecology

## 2021-10-08 ENCOUNTER — Other Ambulatory Visit: Payer: Self-pay

## 2021-10-08 ENCOUNTER — Emergency Department (HOSPITAL_COMMUNITY): Payer: Medicaid Other

## 2021-10-08 ENCOUNTER — Encounter (HOSPITAL_COMMUNITY): Payer: Self-pay | Admitting: Emergency Medicine

## 2021-10-08 ENCOUNTER — Inpatient Hospital Stay (HOSPITAL_COMMUNITY)
Admission: EM | Admit: 2021-10-08 | Discharge: 2021-10-17 | DRG: 280 | Disposition: A | Discharge status: Home / Self Care | Payer: Medicaid Other | Attending: Internal Medicine | Admitting: Internal Medicine

## 2021-10-08 DIAGNOSIS — Z20822 Contact with and (suspected) exposure to covid-19: Secondary | ICD-10-CM | POA: Diagnosis present

## 2021-10-08 DIAGNOSIS — E43 Unspecified severe protein-calorie malnutrition: Secondary | ICD-10-CM | POA: Diagnosis not present

## 2021-10-08 DIAGNOSIS — Z833 Family history of diabetes mellitus: Secondary | ICD-10-CM | POA: Diagnosis not present

## 2021-10-08 DIAGNOSIS — I5023 Acute on chronic systolic (congestive) heart failure: Secondary | ICD-10-CM | POA: Diagnosis not present

## 2021-10-08 DIAGNOSIS — F411 Generalized anxiety disorder: Secondary | ICD-10-CM | POA: Diagnosis present

## 2021-10-08 DIAGNOSIS — L8990 Pressure ulcer of unspecified site, unspecified stage: Secondary | ICD-10-CM | POA: Insufficient documentation

## 2021-10-08 DIAGNOSIS — I255 Ischemic cardiomyopathy: Secondary | ICD-10-CM | POA: Diagnosis present

## 2021-10-08 DIAGNOSIS — Z91199 Patient's noncompliance with other medical treatment and regimen due to unspecified reason: Secondary | ICD-10-CM | POA: Diagnosis not present

## 2021-10-08 DIAGNOSIS — Z8249 Family history of ischemic heart disease and other diseases of the circulatory system: Secondary | ICD-10-CM | POA: Diagnosis not present

## 2021-10-08 DIAGNOSIS — G8929 Other chronic pain: Secondary | ICD-10-CM | POA: Diagnosis not present

## 2021-10-08 DIAGNOSIS — I110 Hypertensive heart disease with heart failure: Secondary | ICD-10-CM | POA: Diagnosis not present

## 2021-10-08 DIAGNOSIS — Z79899 Other long term (current) drug therapy: Secondary | ICD-10-CM | POA: Diagnosis not present

## 2021-10-08 DIAGNOSIS — R570 Cardiogenic shock: Secondary | ICD-10-CM | POA: Diagnosis present

## 2021-10-08 DIAGNOSIS — E876 Hypokalemia: Secondary | ICD-10-CM | POA: Diagnosis present

## 2021-10-08 DIAGNOSIS — I21A1 Myocardial infarction type 2: Secondary | ICD-10-CM | POA: Diagnosis not present

## 2021-10-08 DIAGNOSIS — J9601 Acute respiratory failure with hypoxia: Secondary | ICD-10-CM

## 2021-10-08 DIAGNOSIS — E872 Acidosis, unspecified: Secondary | ICD-10-CM

## 2021-10-08 DIAGNOSIS — Z681 Body mass index (BMI) 19 or less, adult: Secondary | ICD-10-CM

## 2021-10-08 DIAGNOSIS — R64 Cachexia: Secondary | ICD-10-CM | POA: Diagnosis present

## 2021-10-08 DIAGNOSIS — E1165 Type 2 diabetes mellitus with hyperglycemia: Secondary | ICD-10-CM | POA: Diagnosis present

## 2021-10-08 DIAGNOSIS — I509 Heart failure, unspecified: Secondary | ICD-10-CM

## 2021-10-08 DIAGNOSIS — E785 Hyperlipidemia, unspecified: Secondary | ICD-10-CM | POA: Diagnosis not present

## 2021-10-08 DIAGNOSIS — Z800 Family history of malignant neoplasm of digestive organs: Secondary | ICD-10-CM | POA: Diagnosis not present

## 2021-10-08 DIAGNOSIS — I5082 Biventricular heart failure: Secondary | ICD-10-CM | POA: Diagnosis not present

## 2021-10-08 DIAGNOSIS — I25119 Atherosclerotic heart disease of native coronary artery with unspecified angina pectoris: Secondary | ICD-10-CM | POA: Diagnosis present

## 2021-10-08 LAB — CBC WITH DIFFERENTIAL/PLATELET
Abs Immature Granulocytes: 0.07 10*3/uL (ref 0.00–0.07)
Basophils Absolute: 0 10*3/uL (ref 0.0–0.1)
Basophils Relative: 0 %
Eosinophils Absolute: 0 10*3/uL (ref 0.0–0.5)
Eosinophils Relative: 0 %
HCT: 45.2 % (ref 36.0–46.0)
Hemoglobin: 14.6 g/dL (ref 12.0–15.0)
Immature Granulocytes: 1 %
Lymphocytes Relative: 8 %
Lymphs Abs: 1.1 10*3/uL (ref 0.7–4.0)
MCH: 24.8 pg — ABNORMAL LOW (ref 26.0–34.0)
MCHC: 32.3 g/dL (ref 30.0–36.0)
MCV: 76.7 fL — ABNORMAL LOW (ref 80.0–100.0)
Monocytes Absolute: 0.7 10*3/uL (ref 0.1–1.0)
Monocytes Relative: 5 %
Neutro Abs: 11.4 10*3/uL — ABNORMAL HIGH (ref 1.7–7.7)
Neutrophils Relative %: 86 %
Platelets: 316 10*3/uL (ref 150–400)
RBC: 5.89 MIL/uL — ABNORMAL HIGH (ref 3.87–5.11)
RDW: 13.8 % (ref 11.5–15.5)
WBC: 13.3 10*3/uL — ABNORMAL HIGH (ref 4.0–10.5)
nRBC: 0 % (ref 0.0–0.2)

## 2021-10-08 LAB — COMPREHENSIVE METABOLIC PANEL
ALT: 13 U/L (ref 0–44)
AST: 22 U/L (ref 15–41)
Albumin: 3.7 g/dL (ref 3.5–5.0)
Alkaline Phosphatase: 120 U/L (ref 38–126)
Anion gap: 14 (ref 5–15)
BUN: 8 mg/dL (ref 6–20)
CO2: 23 mmol/L (ref 22–32)
Calcium: 9.1 mg/dL (ref 8.9–10.3)
Chloride: 97 mmol/L — ABNORMAL LOW (ref 98–111)
Creatinine, Ser: 0.7 mg/dL (ref 0.44–1.00)
GFR, Estimated: >60 mL/min (ref >60)
Glucose, Bld: 341 mg/dL — ABNORMAL HIGH (ref 70–99)
Potassium: 3.5 mmol/L (ref 3.5–5.1)
Sodium: 134 mmol/L — ABNORMAL LOW (ref 135–145)
Total Bilirubin: 1 mg/dL (ref 0.3–1.2)
Total Protein: 8.1 g/dL (ref 6.5–8.1)

## 2021-10-08 LAB — TROPONIN I (HIGH SENSITIVITY)
Troponin I (High Sensitivity): 35 ng/L — ABNORMAL HIGH (ref <18)
Troponin I (High Sensitivity): 44 ng/L — ABNORMAL HIGH (ref <18)

## 2021-10-08 LAB — BLOOD GAS, ARTERIAL
Acid-base deficit: 1.8 mmol/L (ref 0.0–2.0)
Bicarbonate: 22.1 mmol/L (ref 20.0–28.0)
O2 Saturation: 94.8 %
Patient temperature: 36.9
pCO2 arterial: 34 mmHg (ref 32–48)
pH, Arterial: 7.42 (ref 7.35–7.45)
pO2, Arterial: 69 mmHg — ABNORMAL LOW (ref 83–108)

## 2021-10-08 LAB — RESP PANEL BY RT-PCR (FLU A&B, COVID) ARPGX2
Influenza A by PCR: NEGATIVE
Influenza B by PCR: NEGATIVE
SARS Coronavirus 2 by RT PCR: NEGATIVE

## 2021-10-08 LAB — PROCALCITONIN: Procalcitonin: 0.21 ng/mL

## 2021-10-08 LAB — LACTIC ACID, PLASMA: Lactic Acid, Venous: 4.4 mmol/L (ref 0.5–1.9)

## 2021-10-08 LAB — BRAIN NATRIURETIC PEPTIDE: B Natriuretic Peptide: 1373.8 pg/mL — ABNORMAL HIGH (ref 0.0–100.0)

## 2021-10-08 MED ORDER — CEFTRIAXONE SODIUM 1 G IJ SOLR
1.0000 g | Freq: Once | INTRAMUSCULAR | Status: AC
  Administered 2021-10-08: 1 g via INTRAVENOUS
  Filled 2021-10-08: qty 10

## 2021-10-08 MED ORDER — ONDANSETRON HCL 4 MG/2ML IJ SOLN
4.0000 mg | Freq: Once | INTRAMUSCULAR | Status: AC
  Administered 2021-10-08: 4 mg via INTRAVENOUS
  Filled 2021-10-08: qty 2

## 2021-10-08 MED ORDER — IOHEXOL 350 MG/ML SOLN
80.0000 mL | Freq: Once | INTRAVENOUS | Status: AC | PRN
  Administered 2021-10-08: 80 mL via INTRAVENOUS

## 2021-10-08 MED ORDER — MORPHINE SULFATE (PF) 4 MG/ML IV SOLN
4.0000 mg | Freq: Once | INTRAVENOUS | Status: AC
  Administered 2021-10-08: 4 mg via INTRAVENOUS
  Filled 2021-10-08: qty 1

## 2021-10-08 MED ORDER — FUROSEMIDE 10 MG/ML IJ SOLN
40.0000 mg | Freq: Once | INTRAMUSCULAR | Status: AC
  Administered 2021-10-08: 40 mg via INTRAVENOUS
  Filled 2021-10-08: qty 4

## 2021-10-08 MED ORDER — SODIUM CHLORIDE 0.9 % IV BOLUS: 1000.0000 mL | Freq: Once | INTRAVENOUS | Status: DC

## 2021-10-08 NOTE — ED Notes (Signed)
C/o SOB and CP center of chest, note  swelling to bilat feet ?

## 2021-10-08 NOTE — ED Notes (Signed)
Cardio provider at bedside ?

## 2021-10-08 NOTE — ED Triage Notes (Signed)
Patient reports CP and SOB w/ non-productive cough since 3:00pm today, also reports LE edema that has been ongoing. States she cannot get enough air, RR 34, O2 94% RA. Also reports blood in urine for the past 6 months.  ?

## 2021-10-08 NOTE — ED Provider Notes (Signed)
[facility] DEPT Provider Note   CSN: [id number] Arrival date & time: 10/08/21  1759     History  No chief complaint on file.   Brooke [name] is a 61 y.o. female.  Patient presents with chief complaint of bilateral leg swelling for about 2 weeks and shortness of breath for about 2 weeks.  Worse when she lays flat.  She is also noticed a cough that started today.  Complaining also of chest pain intermittent for the past week describes it as a tightness sensation in her mid chest.      Home Medications Prior to Admission medications   Medication Sig Start Date End Date Taking? Authorizing Provider  acetaminophen (TYLENOL) 500 MG tablet Take 1,000 mg by mouth every 6 (six) hours as needed for mild pain, fever or headache.    [provider]  ALPRAZolam Duanne Moron) 0.25 MG tablet Take 1 tablet (0.25 mg total) by mouth daily as needed for anxiety. 06/28/20   [name], MD  Aspirin-Acetaminophen-Caffeine (PAMPRIN MAX PO) Take 1 tablet by mouth 2 (two) times daily as needed (pelvic pain).    [provider]  cholecalciferol (VITAMIN D3) 25 MCG (1000 UNIT) tablet Take 4,000 Units by mouth daily.    [provider]  diazepam (VALIUM) 5 MG tablet Place 1 tablet vaginally nightly as needed for muscle spasm/ pelvic pain. 03/17/21   Jaquita Folds, MD  estradiol (ESTRACE) 0.1 MG/GM vaginal cream Place 0.5 g vaginally 2 (two) times a week. Place 0.5g nightly for two weeks then twice a week after 03/17/21   Jaquita Folds, MD      Allergies    Aspirin, Darvocet [propoxyphene n-acetaminophen], Erythromycin, Gabapentin, Ibuprofen, and Metformin and related    Review of Systems   Review of Systems  Constitutional:  Negative for fever.  HENT:  Negative for ear pain.   Eyes:  Negative for pain.  Respiratory:  Positive for cough and shortness of breath.   Cardiovascular:  Negative for chest pain.  Gastrointestinal:  Negative  for abdominal pain.  Genitourinary:  Negative for flank pain.  Musculoskeletal:  Negative for back pain.  Skin:  Negative for rash.  Neurological:  Negative for headaches.   Physical Exam Updated Vital Signs BP 129/85    Pulse 94    Temp 98.4 F (36.9 C) (Oral)    Resp (!) 28    Ht 5' 4.5" (1.638 m)    Wt 48 kg    LMP 11/16/2014 (Approximate)    SpO2 97%    BMI 17.88 kg/m  Physical Exam Constitutional:      General: She is not in acute distress.    Appearance: Normal appearance.  HENT:     Head: Normocephalic.     Nose: Nose normal.  Eyes:     Extraocular Movements: Extraocular movements intact.  Cardiovascular:     Rate and Rhythm: Normal rate.  Pulmonary:     Effort: Respiratory distress present.  Abdominal:     Tenderness: There is no abdominal tenderness. There is no guarding or rebound.  Musculoskeletal:        General: Normal range of motion.     Cervical back: Normal range of motion.     Right lower leg: Edema present.     Left lower leg: Edema present.  Neurological:     General: No focal deficit present.     Mental Status: She is alert. Mental status is at baseline.    ED Results /  Procedures / Treatments   Labs (all labs ordered are listed, but only abnormal results are displayed) Labs Reviewed  CBC WITH DIFFERENTIAL/PLATELET - Abnormal; Notable for the following components:      Result Value   WBC 13.3 (*)    RBC 5.89 (*)    MCV 76.7 (*)    MCH 24.8 (*)    Neutro Abs 11.4 (*)    All other components within normal limits  COMPREHENSIVE METABOLIC PANEL - Abnormal; Notable for the following components:   Sodium 134 (*)    Chloride 97 (*)    Glucose, Bld 341 (*)    All other components within normal limits  BRAIN NATRIURETIC PEPTIDE - Abnormal; Notable for the following components:   B Natriuretic Peptide 1,373.8 (*)    All other components within normal limits  LACTIC ACID, PLASMA - Abnormal; Notable for the following components:   Lactic Acid, Venous  4.4 (*)    All other components within normal limits  BLOOD GAS, ARTERIAL - Abnormal; Notable for the following components:   pO2, Arterial 69 (*)    All other components within normal limits  TROPONIN I (HIGH SENSITIVITY) - Abnormal; Notable for the following components:   Troponin I (High Sensitivity) 35 (*)    All other components within normal limits  CULTURE, BLOOD (ROUTINE X 2)  CULTURE, BLOOD (ROUTINE X 2)  RESP PANEL BY RT-PCR (FLU A&B, COVID) ARPGX2  LACTIC ACID, PLASMA  URINALYSIS, ROUTINE W REFLEX MICROSCOPIC  PROCALCITONIN  TROPONIN I (HIGH SENSITIVITY)    EKG EKG Interpretation  Date/Time:  Wednesday October 08 2021 19:39:32 EST Ventricular Rate:  109 PR Interval:  153 QRS Duration: 89 QT Interval:  323 QTC Calculation: 435 R Axis:   50 Text Interpretation: Sinus tachycardia Probable anterior infarct, age indeterminate Confirmed by Thamas Jaegers ([id number]) on 10/08/2021 8:41:03 PM  Radiology CT Angio Chest PE W and/or Wo Contrast  Result Date: 10/08/2021 CLINICAL DATA:  Pulmonary embolism (PE) suspected, unknown D-dimer. reports CP and SOB w/ non-productive cough since 3:00pm today, also reports LE edema that has been ongoing. States she cannot get enough air, RR 34, O2 94% RA. Also reports blood in urine for the past 6 months. EXAM: CT ANGIOGRAPHY CHEST WITH CONTRAST TECHNIQUE: Multidetector CT imaging of the chest was performed using the standard protocol during bolus administration of intravenous contrast. Multiplanar CT image reconstructions and MIPs were obtained to evaluate the vascular anatomy. RADIATION DOSE REDUCTION: This exam was performed according to the departmental dose-optimization program which includes automated exposure control, adjustment of the mA and/or kV according to patient size and/or use of iterative reconstruction technique. CONTRAST:  33mL OMNIPAQUE IOHEXOL 350 MG/ML SOLN COMPARISON:  None. FINDINGS: Cardiovascular: Satisfactory opacification of the  pulmonary arteries to the segmental level. No evidence of pulmonary embolism. The main pulmonary artery is normal in caliber. Normal heart size. No significant pericardial effusion. The thoracic aorta is normal in caliber. At least mild atherosclerotic plaque of the thoracic aorta. Four-vessel coronary artery calcifications. Mediastinum/Nodes: No enlarged mediastinal, hilar, or axillary lymph nodes. Thyroid gland, trachea, and esophagus demonstrate no significant findings. Lungs/Pleura: Limited evaluation due to respiratory motion artifact. Partial collapse of bilateral lower lobes in the right middle lobe. Passive atelectasis of the right upper lobe and lingula. No focal consolidation. No pulmonary nodule. No pulmonary mass. Bilateral moderate volume pleural effusions. No pneumothorax. Upper Abdomen: No acute abnormality. Musculoskeletal: No abdominal wall hernia or abnormality. No suspicious lytic or blastic osseous lesions. No acute displaced  fracture. Multilevel degenerative changes of the spine. Review of the MIP images confirms the above findings. IMPRESSION: 1. No pulmonary embolus. 2. Bilateral moderate pleural effusions. 3. Aortic Atherosclerosis (ICD10-I70.0) including four-vessel coronary calcifications. Electronically Signed   By: Iven Finn M.D.   On: 10/08/2021 21:59   DG Chest Port 1 View  Result Date: 10/08/2021 CLINICAL DATA:  Shortness of breath, chest pain EXAM: PORTABLE CHEST 1 VIEW COMPARISON:  11/08/2011 FINDINGS: Small right pleural effusion and moderate left pleural effusion. Bibasilar atelectasis or infiltrates. Heart is normal size. No effusions. IMPRESSION: Bilateral effusions, left larger than right. Bibasilar atelectasis or infiltrates. Electronically Signed   By: Rolm Baptise M.D.   On: 10/08/2021 19:40    Procedures .Critical Care Performed by: Luna Fuse, MD Authorized by: Luna Fuse, MD   Critical care provider statement:    Critical care time (minutes):  40    Critical care time was exclusive of:  Separately billable procedures and treating other patients and teaching time   Critical care was necessary to treat or prevent imminent or life-threatening deterioration of the following conditions:  Respiratory failure and cardiac failure    Medications Ordered in ED Medications  cefTRIAXone (ROCEPHIN) 1 g in sodium chloride 0.9 % 100 mL IVPB (0 g Intravenous Stopped 10/08/21 2211)  morphine (PF) 4 MG/ML injection 4 mg (4 mg Intravenous Given 10/08/21 2123)  ondansetron (ZOFRAN) injection 4 mg (4 mg Intravenous Given 10/08/21 2122)  furosemide (LASIX) injection 40 mg (40 mg Intravenous Given 10/08/21 2120)  iohexol (OMNIPAQUE) 350 MG/ML injection 80 mL (80 mLs Intravenous Contrast Given 10/08/21 2142)    ED Course/ Medical Decision Making/ A&P                           Medical Decision Making Amount and/or Complexity of Data Reviewed Labs: ordered. Radiology: ordered.  Risk Prescription drug management.   This patient presents to the ED for concern of pain, shortness of breath this involves an extensive number of treatment options, and is a complaint that carries with it a high risk of complications and morbidity.  The differential diagnosis includes acute coronary syndrome, pneumothorax, congestive heart failure, pneumonia, other     Co morbidities that complicate the patient evaluation   Diabetes, hypertension     Additional history obtained:   History from significant other at bedside     Lab Tests:   I Ordered, and personally interpreted labs.  The pertinent results include: Labs are sent.  White count elevated 13 chemistry otherwise unremarkable.  Lactic acid elevated 4.4.  Troponin elevated 35 and proBNP elevated 1300.     Imaging Studies ordered:   Chest x-ray, showing bilateral pleural effusions.  Normal cardiac silhouette.     Cardiac Monitoring:   Sinus rhythm, mild tachycardia.        Consultations Obtained:    Cardiology on-call consultation.  Requesting CT angio and transfer to [facility] prior to resolution of CT.     Patient presents with significant respiratory distress but maintaining O2 saturation at 93 to 97% on room air.  However work of breathing is increasing respiratory rate about 35 breaths/min.  Blood pressure remained stable, no fevers, but elevated white count elevated lactic acidosis.  Chest x-ray shows pleural effusions no obvious pneumonia noted.  Initially ordered IV fluids, with concern for sepsis however proBNP elevated with pleural effusions bilateral leg swelling and orthopnea, more consistent with CHF new onset.  Moreover  afebrile and no reports of fevers.  Calcitonin added to tests.  IV Rocephin started empirically, fluids held and Lasix started.  Case discussed with on-call cardiology who will see the patient at [facility].  Patient transferred to [facility] for further evaluation by cardiology.        Final Clinical Impression(s) / ED Diagnoses Final diagnoses:  Acute respiratory failure with hypoxia (HCC)  Acute congestive heart failure, unspecified heart failure type (HCC)  Lactic acidosis    Rx / DC Orders ED Discharge Orders     None         Luna Fuse, MD 10/08/21 2220

## 2021-10-08 NOTE — ED Notes (Signed)
Pt transferred her from [place] by [facility]. Pt has new onset of CHF BNP 1378, WBC elevated, LA 4.4, Bipap 16/6 35%, lungs are rales. Resp rate 40 before bipap 30 now. Bipap only on for about 15 min, received Lasix, Rocephin, Morphine, Zofran. VS with [facility] hr 100, o2 100%, rr 30, bp 168/92, PIV 20ga Rt AC ?

## 2021-10-09 ENCOUNTER — Encounter (HOSPITAL_COMMUNITY)
Admission: EM | Disposition: A | Discharge status: Home / Self Care | Payer: Self-pay | Source: Home / Self Care | Attending: Internal Medicine

## 2021-10-09 ENCOUNTER — Inpatient Hospital Stay (HOSPITAL_COMMUNITY): Payer: Medicaid Other

## 2021-10-09 DIAGNOSIS — I25119 Atherosclerotic heart disease of native coronary artery with unspecified angina pectoris: Secondary | ICD-10-CM | POA: Diagnosis present

## 2021-10-09 DIAGNOSIS — Z8249 Family history of ischemic heart disease and other diseases of the circulatory system: Secondary | ICD-10-CM | POA: Diagnosis not present

## 2021-10-09 DIAGNOSIS — E43 Unspecified severe protein-calorie malnutrition: Secondary | ICD-10-CM | POA: Diagnosis not present

## 2021-10-09 DIAGNOSIS — E785 Hyperlipidemia, unspecified: Secondary | ICD-10-CM | POA: Diagnosis present

## 2021-10-09 DIAGNOSIS — E872 Acidosis, unspecified: Secondary | ICD-10-CM | POA: Diagnosis not present

## 2021-10-09 DIAGNOSIS — I5021 Acute systolic (congestive) heart failure: Secondary | ICD-10-CM | POA: Diagnosis not present

## 2021-10-09 DIAGNOSIS — R64 Cachexia: Secondary | ICD-10-CM | POA: Diagnosis not present

## 2021-10-09 DIAGNOSIS — F411 Generalized anxiety disorder: Secondary | ICD-10-CM | POA: Diagnosis present

## 2021-10-09 DIAGNOSIS — Z79899 Other long term (current) drug therapy: Secondary | ICD-10-CM | POA: Diagnosis not present

## 2021-10-09 DIAGNOSIS — I2510 Atherosclerotic heart disease of native coronary artery without angina pectoris: Secondary | ICD-10-CM

## 2021-10-09 DIAGNOSIS — I5082 Biventricular heart failure: Secondary | ICD-10-CM | POA: Diagnosis not present

## 2021-10-09 DIAGNOSIS — I509 Heart failure, unspecified: Secondary | ICD-10-CM | POA: Diagnosis not present

## 2021-10-09 DIAGNOSIS — R7989 Other specified abnormal findings of blood chemistry: Secondary | ICD-10-CM | POA: Diagnosis not present

## 2021-10-09 DIAGNOSIS — R570 Cardiogenic shock: Secondary | ICD-10-CM | POA: Diagnosis present

## 2021-10-09 DIAGNOSIS — Z800 Family history of malignant neoplasm of digestive organs: Secondary | ICD-10-CM | POA: Diagnosis not present

## 2021-10-09 DIAGNOSIS — I5023 Acute on chronic systolic (congestive) heart failure: Secondary | ICD-10-CM | POA: Diagnosis not present

## 2021-10-09 DIAGNOSIS — E876 Hypokalemia: Secondary | ICD-10-CM | POA: Diagnosis present

## 2021-10-09 DIAGNOSIS — Z833 Family history of diabetes mellitus: Secondary | ICD-10-CM | POA: Diagnosis not present

## 2021-10-09 DIAGNOSIS — Z681 Body mass index (BMI) 19 or less, adult: Secondary | ICD-10-CM | POA: Diagnosis not present

## 2021-10-09 DIAGNOSIS — I21A1 Myocardial infarction type 2: Secondary | ICD-10-CM | POA: Diagnosis not present

## 2021-10-09 DIAGNOSIS — J9601 Acute respiratory failure with hypoxia: Secondary | ICD-10-CM | POA: Diagnosis not present

## 2021-10-09 DIAGNOSIS — Z20822 Contact with and (suspected) exposure to covid-19: Secondary | ICD-10-CM | POA: Diagnosis not present

## 2021-10-09 DIAGNOSIS — E119 Type 2 diabetes mellitus without complications: Secondary | ICD-10-CM | POA: Diagnosis not present

## 2021-10-09 DIAGNOSIS — E1165 Type 2 diabetes mellitus with hyperglycemia: Secondary | ICD-10-CM | POA: Diagnosis not present

## 2021-10-09 DIAGNOSIS — G8929 Other chronic pain: Secondary | ICD-10-CM | POA: Diagnosis present

## 2021-10-09 DIAGNOSIS — Z91199 Patient's noncompliance with other medical treatment and regimen due to unspecified reason: Secondary | ICD-10-CM | POA: Diagnosis not present

## 2021-10-09 DIAGNOSIS — I255 Ischemic cardiomyopathy: Secondary | ICD-10-CM | POA: Diagnosis present

## 2021-10-09 DIAGNOSIS — I110 Hypertensive heart disease with heart failure: Secondary | ICD-10-CM | POA: Diagnosis not present

## 2021-10-09 HISTORY — PX: RIGHT/LEFT HEART CATH AND CORONARY ANGIOGRAPHY: CATH118266

## 2021-10-09 LAB — POCT I-STAT 7, (LYTES, BLD GAS, ICA,H+H)
Acid-Base Excess: 6 mmol/L — ABNORMAL HIGH (ref 0.0–2.0)
Bicarbonate: 28.6 mmol/L — ABNORMAL HIGH (ref 20.0–28.0)
Calcium, Ion: 1.11 mmol/L — ABNORMAL LOW (ref 1.15–1.40)
HCT: 37 % (ref 36.0–46.0)
Hemoglobin: 12.6 g/dL (ref 12.0–15.0)
O2 Saturation: 96 %
Potassium: 3.4 mmol/L — ABNORMAL LOW (ref 3.5–5.1)
Sodium: 138 mmol/L (ref 135–145)
TCO2: 30 mmol/L (ref 22–32)
pCO2 arterial: 33.9 mmHg (ref 32–48)
pH, Arterial: 7.534 — ABNORMAL HIGH (ref 7.35–7.45)
pO2, Arterial: 72 mmHg — ABNORMAL LOW (ref 83–108)

## 2021-10-09 LAB — POCT I-STAT EG7
Acid-Base Excess: 6 mmol/L — ABNORMAL HIGH (ref 0.0–2.0)
Acid-Base Excess: 6 mmol/L — ABNORMAL HIGH (ref 0.0–2.0)
Bicarbonate: 31 mmol/L — ABNORMAL HIGH (ref 20.0–28.0)
Bicarbonate: 31.1 mmol/L — ABNORMAL HIGH (ref 20.0–28.0)
Calcium, Ion: 1.07 mmol/L — ABNORMAL LOW (ref 1.15–1.40)
Calcium, Ion: 1.12 mmol/L — ABNORMAL LOW (ref 1.15–1.40)
HCT: 36 % (ref 36.0–46.0)
HCT: 37 % (ref 36.0–46.0)
Hemoglobin: 12.2 g/dL (ref 12.0–15.0)
Hemoglobin: 12.6 g/dL (ref 12.0–15.0)
O2 Saturation: 68 %
O2 Saturation: 75 %
Potassium: 3.3 mmol/L — ABNORMAL LOW (ref 3.5–5.1)
Potassium: 3.4 mmol/L — ABNORMAL LOW (ref 3.5–5.1)
Sodium: 139 mmol/L (ref 135–145)
Sodium: 139 mmol/L (ref 135–145)
TCO2: 32 mmol/L (ref 22–32)
TCO2: 32 mmol/L (ref 22–32)
pCO2, Ven: 43.4 mmHg — ABNORMAL LOW (ref 44–60)
pCO2, Ven: 45.3 mmHg (ref 44–60)
pH, Ven: 7.444 — ABNORMAL HIGH (ref 7.25–7.43)
pH, Ven: 7.462 — ABNORMAL HIGH (ref 7.25–7.43)
pO2, Ven: 33 mmHg (ref 32–45)
pO2, Ven: 39 mmHg (ref 32–45)

## 2021-10-09 LAB — LACTIC ACID, PLASMA
Lactic Acid, Venous: 4 mmol/L (ref 0.5–1.9)
Lactic Acid, Venous: 3 mmol/L (ref 0.5–1.9)
Lactic Acid, Venous: 2.1 mmol/L (ref 0.5–1.9)

## 2021-10-09 LAB — BASIC METABOLIC PANEL
Anion gap: 15 (ref 5–15)
BUN: 8 mg/dL (ref 6–20)
CO2: 24 mmol/L (ref 22–32)
Calcium: 8.5 mg/dL — ABNORMAL LOW (ref 8.9–10.3)
Chloride: 96 mmol/L — ABNORMAL LOW (ref 98–111)
Creatinine, Ser: 0.79 mg/dL (ref 0.44–1.00)
GFR, Estimated: >60 mL/min (ref >60)
Glucose, Bld: 330 mg/dL — ABNORMAL HIGH (ref 70–99)
Potassium: 3.4 mmol/L — ABNORMAL LOW (ref 3.5–5.1)
Sodium: 135 mmol/L (ref 135–145)

## 2021-10-09 LAB — SURGICAL PCR SCREEN
MRSA, PCR: NEGATIVE
Staphylococcus aureus: NEGATIVE

## 2021-10-09 LAB — TSH: TSH: 2.06 u[IU]/mL (ref 0.350–4.500)

## 2021-10-09 LAB — CBC
HCT: 42.4 % (ref 36.0–46.0)
Hemoglobin: 13.5 g/dL (ref 12.0–15.0)
MCH: 24.9 pg — ABNORMAL LOW (ref 26.0–34.0)
MCHC: 31.8 g/dL (ref 30.0–36.0)
MCV: 78.1 fL — ABNORMAL LOW (ref 80.0–100.0)
Platelets: 311 10*3/uL (ref 150–400)
RBC: 5.43 MIL/uL — ABNORMAL HIGH (ref 3.87–5.11)
RDW: 13.9 % (ref 11.5–15.5)
WBC: 12.5 10*3/uL — ABNORMAL HIGH (ref 4.0–10.5)
nRBC: 0 % (ref 0.0–0.2)

## 2021-10-09 LAB — ECHOCARDIOGRAM COMPLETE
AR max vel: 1.79 cm2
AV Area VTI: 1.65 cm2
AV Area mean vel: 1.68 cm2
AV Mean grad: 2 mmHg
AV Peak grad: 3.1 mmHg
Ao pk vel: 0.88 m/s
Area-P 1/2: 5.62 cm2
Height: 64.5 in
S' Lateral: 4.1 cm
Weight: 1693.13 oz

## 2021-10-09 LAB — MAGNESIUM: Magnesium: 1.6 mg/dL — ABNORMAL LOW (ref 1.7–2.4)

## 2021-10-09 LAB — PROCALCITONIN: Procalcitonin: 0.81 ng/mL

## 2021-10-09 LAB — COOXEMETRY PANEL
Carboxyhemoglobin: 0.5 % (ref 0.5–1.5)
Carboxyhemoglobin: 1.6 % — ABNORMAL HIGH (ref 0.5–1.5)
Methemoglobin: 2.5 % — ABNORMAL HIGH (ref 0.0–1.5)
Methemoglobin: 1.1 % (ref 0.0–1.5)
O2 Saturation: 62.9 %
O2 Saturation: 57.5 %
Total hemoglobin: 12.5 g/dL (ref 12.0–16.0)
Total hemoglobin: 12.1 g/dL (ref 12.0–16.0)

## 2021-10-09 LAB — TROPONIN I (HIGH SENSITIVITY)
Troponin I (High Sensitivity): 58 ng/L — ABNORMAL HIGH (ref <18)
Troponin I (High Sensitivity): 135 ng/L (ref <18)

## 2021-10-09 LAB — CBG MONITORING, ED: Glucose-Capillary: 194 mg/dL — ABNORMAL HIGH (ref 70–99)

## 2021-10-09 LAB — HEMOGLOBIN A1C
Hgb A1c MFr Bld: 11.5 % — ABNORMAL HIGH (ref 4.8–5.6)
Mean Plasma Glucose: 283.35 mg/dL

## 2021-10-09 LAB — PROTIME-INR
INR: 1.3 — ABNORMAL HIGH (ref 0.8–1.2)
Prothrombin Time: 16.6 seconds — ABNORMAL HIGH (ref 11.4–15.2)

## 2021-10-09 LAB — HIV ANTIBODY (ROUTINE TESTING W REFLEX): HIV Screen 4th Generation wRfx: NONREACTIVE

## 2021-10-09 SURGERY — RIGHT/LEFT HEART CATH AND CORONARY ANGIOGRAPHY
Anesthesia: LOCAL

## 2021-10-09 MED ORDER — ASPIRIN 81 MG PO CHEW
81.0000 mg | CHEWABLE_TABLET | Freq: Every day | ORAL | Status: DC
  Administered 2021-10-10 – 2021-10-17 (×8): 81 mg via ORAL
  Filled 2021-10-09 (×8): qty 1

## 2021-10-09 MED ORDER — LABETALOL HCL 5 MG/ML IV SOLN: 10.0000 mg | INTRAVENOUS | Status: AC | PRN

## 2021-10-09 MED ORDER — MIDAZOLAM HCL 2 MG/2ML IJ SOLN
INTRAMUSCULAR | Status: DC | PRN
  Administered 2021-10-09: 1 mg via INTRAVENOUS

## 2021-10-09 MED ORDER — HEPARIN (PORCINE) IN NACL 1000-0.9 UT/500ML-% IV SOLN
INTRAVENOUS | Status: AC
  Filled 2021-10-09: qty 500

## 2021-10-09 MED ORDER — FENTANYL CITRATE (PF) 100 MCG/2ML IJ SOLN
INTRAMUSCULAR | Status: DC | PRN
Start: 2021-10-09 — End: 2021-10-09
  Administered 2021-10-09: 25 ug via INTRAVENOUS

## 2021-10-09 MED ORDER — SODIUM CHLORIDE 0.9 % IV SOLN
INTRAVENOUS | Status: AC | PRN
  Administered 2021-10-09: 10 mL/h via INTRAVENOUS

## 2021-10-09 MED ORDER — ATORVASTATIN CALCIUM 80 MG PO TABS
80.0000 mg | ORAL_TABLET | Freq: Every day | ORAL | Status: DC
  Administered 2021-10-09 – 2021-10-17 (×9): 80 mg via ORAL
  Filled 2021-10-09 (×9): qty 1

## 2021-10-09 MED ORDER — HEPARIN (PORCINE) 25000 UT/250ML-% IV SOLN
900.0000 [IU]/h | INTRAVENOUS | Status: DC
  Administered 2021-10-09: 600 [IU]/h via INTRAVENOUS
  Administered 2021-10-11: 900 [IU]/h via INTRAVENOUS
  Filled 2021-10-09 (×2): qty 250

## 2021-10-09 MED ORDER — MIDAZOLAM HCL 2 MG/2ML IJ SOLN
INTRAMUSCULAR | Status: AC
  Filled 2021-10-09: qty 2

## 2021-10-09 MED ORDER — HYDRALAZINE HCL 20 MG/ML IJ SOLN: 10.0000 mg | INTRAMUSCULAR | Status: AC | PRN

## 2021-10-09 MED ORDER — SODIUM CHLORIDE 0.9 % IV SOLN: 250.0000 mL | INTRAVENOUS | Status: DC | PRN

## 2021-10-09 MED ORDER — ASPIRIN 81 MG PO CHEW
CHEWABLE_TABLET | ORAL | Status: AC
  Filled 2021-10-09: qty 1

## 2021-10-09 MED ORDER — VERAPAMIL HCL 2.5 MG/ML IV SOLN
INTRAVENOUS | Status: DC | PRN
  Administered 2021-10-09: 10 mL via INTRA_ARTERIAL

## 2021-10-09 MED ORDER — SODIUM CHLORIDE 0.9 % IV SOLN
INTRAVENOUS | Status: AC | PRN
Start: 2021-10-09 — End: 2021-10-09
  Administered 2021-10-09 (×2): 10 mL/h via INTRAVENOUS

## 2021-10-09 MED ORDER — HEPARIN (PORCINE) IN NACL 1000-0.9 UT/500ML-% IV SOLN
INTRAVENOUS | Status: DC | PRN
  Administered 2021-10-09 (×2): 500 mL

## 2021-10-09 MED ORDER — ONDANSETRON HCL 4 MG/2ML IJ SOLN: 4.0000 mg | Freq: Four times a day (QID) | INTRAMUSCULAR | Status: DC | PRN

## 2021-10-09 MED ORDER — ENOXAPARIN SODIUM 30 MG/0.3ML IJ SOSY
30.0000 mg | PREFILLED_SYRINGE | INTRAMUSCULAR | Status: DC
  Administered 2021-10-09: 30 mg via SUBCUTANEOUS
  Filled 2021-10-09: qty 0.3

## 2021-10-09 MED ORDER — HEPARIN SODIUM (PORCINE) 1000 UNIT/ML IJ SOLN
INTRAMUSCULAR | Status: DC | PRN
  Administered 2021-10-09: 5000 [IU] via INTRAVENOUS

## 2021-10-09 MED ORDER — ASPIRIN EC 325 MG PO TBEC: 325.0000 mg | DELAYED_RELEASE_TABLET | Freq: Every day | ORAL | Status: DC

## 2021-10-09 MED ORDER — ASPIRIN 81 MG PO CHEW
81.0000 mg | CHEWABLE_TABLET | ORAL | Status: AC
  Administered 2021-10-09: 81 mg via ORAL

## 2021-10-09 MED ORDER — ACETAMINOPHEN 500 MG PO TABS: 1000.0000 mg | ORAL_TABLET | Freq: Four times a day (QID) | ORAL | Status: DC | PRN

## 2021-10-09 MED ORDER — FENTANYL CITRATE (PF) 100 MCG/2ML IJ SOLN
INTRAMUSCULAR | Status: AC
  Filled 2021-10-09: qty 2

## 2021-10-09 MED ORDER — ACETAMINOPHEN 325 MG PO TABS
650.0000 mg | ORAL_TABLET | ORAL | Status: DC | PRN
  Filled 2021-10-09: qty 2

## 2021-10-09 MED ORDER — IOHEXOL 350 MG/ML SOLN
INTRAVENOUS | Status: DC | PRN
  Administered 2021-10-09: 55 mL

## 2021-10-09 MED ORDER — POTASSIUM CHLORIDE CRYS ER 20 MEQ PO TBCR
40.0000 meq | EXTENDED_RELEASE_TABLET | Freq: Once | ORAL | Status: AC
  Administered 2021-10-09: 40 meq via ORAL
  Filled 2021-10-09: qty 2

## 2021-10-09 MED ORDER — SODIUM CHLORIDE 0.9% FLUSH: 3.0000 mL | INTRAVENOUS | Status: DC | PRN

## 2021-10-09 MED ORDER — SODIUM CHLORIDE 0.9% FLUSH
3.0000 mL | Freq: Two times a day (BID) | INTRAVENOUS | Status: DC
  Administered 2021-10-09 – 2021-10-17 (×11): 3 mL via INTRAVENOUS

## 2021-10-09 MED ORDER — ACETAMINOPHEN 325 MG PO TABS
650.0000 mg | ORAL_TABLET | ORAL | Status: DC | PRN
  Administered 2021-10-10 – 2021-10-17 (×3): 650 mg via ORAL
  Filled 2021-10-09 (×3): qty 2

## 2021-10-09 MED ORDER — LIDOCAINE HCL (PF) 1 % IJ SOLN
INTRAMUSCULAR | Status: AC
  Filled 2021-10-09: qty 30

## 2021-10-09 MED ORDER — LIDOCAINE HCL (PF) 1 % IJ SOLN
INTRAMUSCULAR | Status: DC | PRN
  Administered 2021-10-09 (×2): 2 mL

## 2021-10-09 MED ORDER — ALPRAZOLAM 0.25 MG PO TABS
0.2500 mg | ORAL_TABLET | Freq: Every day | ORAL | Status: DC | PRN
  Administered 2021-10-10 – 2021-10-13 (×4): 0.25 mg via ORAL
  Filled 2021-10-09 (×4): qty 1

## 2021-10-09 MED ORDER — SODIUM CHLORIDE 0.9 % IV SOLN: INTRAVENOUS | Status: DC

## 2021-10-09 MED ORDER — MILRINONE LACTATE IN DEXTROSE 20-5 MG/100ML-% IV SOLN
0.2500 ug/kg/min | INTRAVENOUS | Status: DC
  Administered 2021-10-09: 0.25 ug/kg/min via INTRAVENOUS
  Filled 2021-10-09: qty 100

## 2021-10-09 MED ORDER — VERAPAMIL HCL 2.5 MG/ML IV SOLN
INTRAVENOUS | Status: AC
  Filled 2021-10-09: qty 2

## 2021-10-09 MED ORDER — SODIUM CHLORIDE 0.9% FLUSH
3.0000 mL | Freq: Two times a day (BID) | INTRAVENOUS | Status: DC
  Administered 2021-10-09 – 2021-10-13 (×6): 3 mL via INTRAVENOUS

## 2021-10-09 MED ORDER — SODIUM CHLORIDE 0.9 % IV SOLN
INTRAVENOUS | Status: AC | PRN
Start: 2021-10-09 — End: 2021-10-09
  Administered 2021-10-09: 10 mL/h via INTRAVENOUS

## 2021-10-09 MED ORDER — MILRINONE LACTATE IN DEXTROSE 20-5 MG/100ML-% IV SOLN
0.1250 ug/kg/min | INTRAVENOUS | Status: DC
  Administered 2021-10-09 – 2021-10-10 (×3): 0.5 ug/kg/min via INTRAVENOUS
  Administered 2021-10-12 (×2): 0.375 ug/kg/min via INTRAVENOUS
  Administered 2021-10-13: 0.25 ug/kg/min via INTRAVENOUS
  Administered 2021-10-14: 0.125 ug/kg/min via INTRAVENOUS
  Filled 2021-10-09 (×8): qty 100

## 2021-10-09 MED ORDER — PANTOPRAZOLE SODIUM 40 MG PO TBEC
40.0000 mg | DELAYED_RELEASE_TABLET | Freq: Every day | ORAL | Status: DC
Start: 2021-10-09 — End: 2021-10-15
  Administered 2021-10-10 – 2021-10-14 (×5): 40 mg via ORAL
  Filled 2021-10-09 (×5): qty 1

## 2021-10-09 MED ORDER — LOSARTAN POTASSIUM 25 MG PO TABS: 25.0000 mg | ORAL_TABLET | Freq: Every day | ORAL | Status: DC

## 2021-10-09 MED ORDER — MUPIROCIN 2 % EX OINT
1.0000 "application " | TOPICAL_OINTMENT | Freq: Two times a day (BID) | CUTANEOUS | Status: DC
  Administered 2021-10-09: 1 via NASAL
  Filled 2021-10-09: qty 22

## 2021-10-09 MED ORDER — HEPARIN SODIUM (PORCINE) 1000 UNIT/ML IJ SOLN
INTRAMUSCULAR | Status: AC
  Filled 2021-10-09: qty 10

## 2021-10-09 MED ORDER — DIAZEPAM 5 MG PO TABS: 5.0000 mg | ORAL_TABLET | Freq: Every day | ORAL | Status: DC | PRN

## 2021-10-09 MED ORDER — SODIUM CHLORIDE 0.9% FLUSH
3.0000 mL | Freq: Two times a day (BID) | INTRAVENOUS | Status: DC
  Administered 2021-10-09 – 2021-10-14 (×9): 3 mL via INTRAVENOUS

## 2021-10-09 MED ORDER — FUROSEMIDE 10 MG/ML IJ SOLN
60.0000 mg | Freq: Four times a day (QID) | INTRAMUSCULAR | Status: DC
  Administered 2021-10-09 (×2): 60 mg via INTRAVENOUS
  Filled 2021-10-09 (×2): qty 6

## 2021-10-09 MED ORDER — CHLORHEXIDINE GLUCONATE CLOTH 2 % EX PADS
6.0000 | MEDICATED_PAD | Freq: Every day | CUTANEOUS | Status: DC
  Administered 2021-10-09 – 2021-10-17 (×7): 6 via TOPICAL

## 2021-10-09 MED ORDER — CAPTOPRIL 12.5 MG PO TABS
6.2500 mg | ORAL_TABLET | Freq: Three times a day (TID) | ORAL | Status: DC
  Administered 2021-10-09: 6.25 mg via ORAL
  Filled 2021-10-09 (×4): qty 0.5

## 2021-10-09 SURGICAL SUPPLY — 17 items
CATH DIAG 6FR JL4 (CATHETERS) ×1 IMPLANT
CATH DIAG 6FR JR4 (CATHETERS) ×1 IMPLANT
CATH DIAG 6FR PIGTAIL ANGLED (CATHETERS) ×1 IMPLANT
CATH INFINITI 5FR JL4 (CATHETERS) ×1 IMPLANT
CATH INFINITI 6F FL3.5 (CATHETERS) ×1 IMPLANT
CATH SWAN GANZ VIP 7.5F (CATHETERS) ×1 IMPLANT
DEVICE RAD TR BAND REGULAR (VASCULAR PRODUCTS) ×1 IMPLANT
GLIDESHEATH SLEND SS 6F .021 (SHEATH) ×1 IMPLANT
GUIDEWIRE INQWIRE 1.5J.035X260 (WIRE) IMPLANT
INQWIRE 1.5J .035X260CM (WIRE) ×2
KIT HEART LEFT (KITS) ×3 IMPLANT
KIT MICROPUNCTURE NIT STIFF (SHEATH) ×1 IMPLANT
PACK CARDIAC CATHETERIZATION (CUSTOM PROCEDURE TRAY) ×3 IMPLANT
SHEATH PINNACLE 8F 10CM (SHEATH) ×1 IMPLANT
SLEEVE REPOSITIONING LENGTH 30 (MISCELLANEOUS) ×1 IMPLANT
TRANSDUCER W/STOPCOCK (MISCELLANEOUS) ×3 IMPLANT
TUBING CIL FLEX 10 FLL-RA (TUBING) ×3 IMPLANT

## 2021-10-09 NOTE — H&P (Signed)
Cardiology Admission History and Physical:   Patient ID: Brooke [name] MRN: [id number]; DOB: Feb 08, 1961   Admission date: 10/08/2021  PCP:  Patient, No Pcp Per (Inactive)   [facility] Providers Cardiologist:  None       Chief Complaint: Shortness of breath and chest pain  Patient Profile:   Brooke [name] is a 61 y.o. female with pelvic floor dysfunction, chronic abdominal pain, endometriosis, diabetes who is being seen 10/09/2021 for the evaluation of chest pain and shortness of breath.  History of Present Illness:   Brooke [name] presented to [facility] long ED today with complaint of shortness of breath.  She was evaluated with labs and found to have an elevated BNP, mildly elevated troponin, and elevated lactate.  CT angiogram pulmonary embolism protocol excluded an acute PE. She was quite tachypneic at presentation and started on BiPAP and diuretics to which she responded.  Lactic acid was elevated.  I was contacted about transfer.  On my evaluation she reports shortness of breath starting about 6 weeks ago.  It is gotten progressively worse to the point in which she can barely walk across the room.  She has orthopnea, PND, and lower extremity edema.  She has symptoms of early satiety and generalized fatigue.  No fevers, chills, and she has chronic abdominal pain.  She has a cough but has been nonproductive in the context of her pulmonary edema.  He has noticed intermittent dizziness but had no palpitations.  She also describes chest pain which has been going off and on for a few weeks.  It starts in her left shoulder and radiates up her neck.  She had an episode of this today which was worse than prior so she presented.  She is not having chest pain or neck pain now.  Nitroglycerin was not trialed.  No prior cardiovascular history.  She had a father had an MI in his 44s.  She does not smoke and denies illicit drug or alcohol use.  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  She is accompanied by her kind husband who is dialyzed at our facility.  Past Medical History:  Diagnosis Date   Abdominal pain, left lower quadrant 12/17/2015   Chest pain, unspecified 10/05/2003   Depressive disorder, not elsewhere classified 07/31/2005   Diabetes mellitus without complication ([place]) 12/45/8099   Endometriosis 09/14/2012   Hypertension 11/23/2003   Unspecified constipation 06/24/2004    Past Surgical History:  Procedure Laterality Date   APPENDECTOMY  1996   Dr. Lindon Romp   BREAST SURGERY Bilateral 1996   Dr. Isaiah Blakes   INGUINAL HERNIA REPAIR     OVARIAN CYST REMOVAL       Medications Prior to Admission: Prior to Admission medications   Medication Sig Start Date End Date Taking? Authorizing Provider  acetaminophen (TYLENOL) 500 MG tablet Take 1,000 mg by mouth every 6 (six) hours as needed for mild pain, fever or headache.   Yes [provider]  ALPRAZolam (XANAX) 0.25 MG tablet Take 1 tablet (0.25 mg total) by mouth daily as needed for anxiety. 06/28/20  Yes [name], MD  Aspirin-Acetaminophen-Caffeine (PAMPRIN MAX PO) Take 1 tablet by mouth 2 (two) times daily as needed (pelvic pain).   Yes [provider]  cholecalciferol (VITAMIN D3) 25 MCG (1000 UNIT) tablet Take 4,000 Units by mouth daily.   Yes [provider]  diazepam (VALIUM) 5 MG tablet Place 1 tablet vaginally nightly as needed for muscle spasm/ pelvic  pain. 03/17/21  Yes Jaquita Folds, MD  diphenhydrAMINE (BENADRYL) 25 MG tablet Take 50 mg by mouth every 6 (six) hours as needed for itching or allergies.   Yes [provider]  vitamin B-12 (CYANOCOBALAMIN) 1000 MCG tablet Take 3,000 mcg by mouth daily.   Yes [provider]  estradiol (ESTRACE) 0.1 MG/GM vaginal cream Place 0.5 g vaginally 2 (two) times a week. Place 0.5g nightly for two weeks then twice a week after Patient not taking: Reported on 10/09/2021 03/17/21   Jaquita Folds, MD     Allergies:    Allergies  Allergen Reactions   Aspirin     Intolerance (aspirin sensitive stomach)   Darvocet [Propoxyphene N-Acetaminophen]    Erythromycin    Gabapentin Other (See Comments)    Suicidal ideations    Ibuprofen    Metformin And Related Nausea Only    Social History:   Social History   Socioeconomic History   Marital status: Married    Spouse name: Not on file   Number of children: Not on file   Years of education: Not on file   Highest education level: Not on file  Occupational History   Not on file  Tobacco Use   Smoking status: Never   Smokeless tobacco: Never  Vaping Use   Vaping Use: Never used  Substance and Sexual Activity   Alcohol use: No   Drug use: No   Sexual activity: Not Currently  Other Topics Concern   Not on file  Social History Narrative   Not on file   Social Determinants of Health   Financial Resource Strain: Not on file  Food Insecurity: Not on file  Transportation Needs: Not on file  Physical Activity: Not on file  Stress: Not on file  Social Connections: Not on file  Intimate Partner Violence: Not on file    Family History:   Per HPI The patient's family history includes Arthritis in her mother; Diabetes in her mother; Hypertension in her mother; Pancreatic cancer in her father. There is no history of Stomach cancer, Colon cancer, Esophageal cancer, or Rectal cancer.    ROS:  Please see the history of present illness.  All other ROS reviewed and negative.     Physical Exam/Data:   Vitals:   10/08/21 2210 10/08/21 2246 10/08/21 2300 10/09/21 0000  BP: 129/85  (!) 151/102 (!) 152/109  Pulse: 94 98 95 79  Resp: (!) 28  17 (!) 24  Temp:      TempSrc:      SpO2: 97% 97% 96% 91%  Weight:      Height:       No intake or output data in the 24 hours ending 10/09/21 0039 Last 3 Weights 10/08/2021 03/17/2021 12/11/2020  Weight (lbs) 105 lb 13.1 oz 106 lb 109 lb  Weight (kg) 48 kg 48.081 kg 49.442 kg      Body mass index is 17.88 kg/m.  General: Fatigued appearing with surprisingly pleasant affect HEENT: normal Neck: Jugular venous pressure is greater than 15 cmH2O Vascular: No carotid bruits; Distal pulses 2+ bilaterally   Cardiac: Tachycardic regular rate and rhythm without a murmur Lungs:  clear to auscultation bilaterally, no wheezing, rhonchi or rales  Abd: soft, nontender, no hepatomegaly  Ext: 2+ peripheral edema extremities are cold Musculoskeletal:  No deformities, BUE and BLE strength normal and equal Skin: warm and dry  Neuro:  CNs 2-12 intact, no focal abnormalities noted Psych:  Normal affect  EKG: Personally reviewed sinus tachycardia.  Q waves in 1 and 3 these are new from prior ECG.  Nonspecific ST changes present  Relevant CV Studies: All available cardiovascular studies  Laboratory Data:  High Sensitivity Troponin:   Recent Labs  Lab 10/08/21 1947 10/08/21 2150  TROPONINIHS 35* 44*      Chemistry Recent Labs  Lab 10/08/21 1947  NA 134*  K 3.5  CL 97*  CO2 23  GLUCOSE 341*  BUN 8  CREATININE 0.70  CALCIUM 9.1  GFRNONAA >60  ANIONGAP 14    Recent Labs  Lab 10/08/21 1947  PROT 8.1  ALBUMIN 3.7  AST 22  ALT 13  ALKPHOS 120  BILITOT 1.0   Lipids No results for input(s): CHOL, TRIG, HDL, LABVLDL, LDLCALC, CHOLHDL in the last 168 hours. Hematology Recent Labs  Lab 10/08/21 1947  WBC 13.3*  RBC 5.89*  HGB 14.6  HCT 45.2  MCV 76.7*  MCH 24.8*  MCHC 32.3  RDW 13.8  PLT 316   Thyroid No results for input(s): TSH, FREET4 in the last 168 hours. BNP Recent Labs  Lab 10/08/21 1947  BNP 1,373.8*    DDimer No results for input(s): DDIMER in the last 168 hours.   Radiology/Studies:  CT Angio Chest PE W and/or Wo Contrast  Result Date: 10/08/2021 CLINICAL DATA:  Pulmonary embolism (PE) suspected, unknown D-dimer. reports CP and SOB w/ non-productive cough since 3:00pm today, also reports LE edema that has been ongoing. States she  cannot get enough air, RR 34, O2 94% RA. Also reports blood in urine for the past 6 months. EXAM: CT ANGIOGRAPHY CHEST WITH CONTRAST TECHNIQUE: Multidetector CT imaging of the chest was performed using the standard protocol during bolus administration of intravenous contrast. Multiplanar CT image reconstructions and MIPs were obtained to evaluate the vascular anatomy. RADIATION DOSE REDUCTION: This exam was performed according to the departmental dose-optimization program which includes automated exposure control, adjustment of the mA and/or kV according to patient size and/or use of iterative reconstruction technique. CONTRAST:  43mL OMNIPAQUE IOHEXOL 350 MG/ML SOLN COMPARISON:  None. FINDINGS: Cardiovascular: Satisfactory opacification of the pulmonary arteries to the segmental level. No evidence of pulmonary embolism. The main pulmonary artery is normal in caliber. Normal heart size. No significant pericardial effusion. The thoracic aorta is normal in caliber. At least mild atherosclerotic plaque of the thoracic aorta. Four-vessel coronary artery calcifications. Mediastinum/Nodes: No enlarged mediastinal, hilar, or axillary lymph nodes. Thyroid gland, trachea, and esophagus demonstrate no significant findings. Lungs/Pleura: Limited evaluation due to respiratory motion artifact. Partial collapse of bilateral lower lobes in the right middle lobe. Passive atelectasis of the right upper lobe and lingula. No focal consolidation. No pulmonary nodule. No pulmonary mass. Bilateral moderate volume pleural effusions. No pneumothorax. Upper Abdomen: No acute abnormality. Musculoskeletal: No abdominal wall hernia or abnormality. No suspicious lytic or blastic osseous lesions. No acute displaced fracture. Multilevel degenerative changes of the spine. Review of the MIP images confirms the above findings. IMPRESSION: 1. No pulmonary embolus. 2. Bilateral moderate pleural effusions. 3. Aortic Atherosclerosis (ICD10-I70.0)  including four-vessel coronary calcifications. Electronically Signed   By: Iven Finn M.D.   On: 10/08/2021 21:59   DG Chest Port 1 View  Result Date: 10/08/2021 CLINICAL DATA:  Shortness of breath, chest pain EXAM: PORTABLE CHEST 1 VIEW COMPARISON:  11/08/2011 FINDINGS: Small right pleural effusion and moderate left pleural effusion. Bibasilar atelectasis or infiltrates. Heart is normal size. No effusions. IMPRESSION: Bilateral effusions, left larger than right. Bibasilar atelectasis  or infiltrates. Electronically Signed   By: Rolm Baptise M.D.   On: 10/08/2021 19:40     Assessment and Plan:   61 year old female with a history of diabetes some pelvic floor dysfunction presents with cardiogenic shock.  Signs of shock including sinus tachycardia, elevated lactate, acute hypoxic respiratory failure presentation.  Her blood pressure remains robust, renal function is normal, and LFTs are within normal limits.  She is at risk for complicating sepsis given her pelvic floor dysfunction and incontinence so cannot exclude a septic component, which she is ice cold on exam.  Given robust blood pressure, response to diuretics in the ED, I do not know that ICU will add much at this point.  Started inotrope.  Start aggressive diuresis.  Start low-dose captopril and titrate for afterload reduction.  Given the anginal symptoms and heavy calcification seen on her pulmonary embolism study today I suspect an underlying ischemic cardiomyopathy.  N.p.o. for invasive angiography and hemodynamics tomorrow.  Plan Cardiogenic shock Acute heart failure syndrome -Furosemide 60 mg 3 times daily including now -Potassium -Start milrinone 0.25 mcg/kg/min.  PICC ordered. -Start captopril 6.25 mg 3 times daily uptitrate as tolerated -Echo -Telemetry  Elevated troponin Chest pain -ASA 325 mg in the ED -Convinced this is acute coronary syndrome at this time with a relatively adynamic mildly elevated troponin in the  setting of hypo-perfusion. -Having coronary calcification: Favor invasive angiogram tomorrow.  N.p.o. for cardiac catheterization.  I have discussed with the patient.  Generalized anxiety Alprazolam 0.25 mg as needed Diazepam 5 mg daily as needed.  She actually uses intravaginally for pelvic floor dysfunction.  Gastritis Has an aspirin sensitivity with gastritis.  Starting daily PPI.  Risk Assessment/Risk Scores:       New York Heart Association (NYHA) Functional Class NYHA Class IV     Severity of Illness: The appropriate patient status for this patient is INPATIENT. Inpatient status is judged to be reasonable and necessary in order to provide the required intensity of service to ensure the patient's safety. The patient's presenting symptoms, physical exam findings, and initial radiographic and laboratory data in the context of their chronic comorbidities is felt to place them at high risk for further clinical deterioration. Furthermore, it is not anticipated that the patient will be medically stable for discharge from the hospital within 2 midnights of admission.   * I certify that at the point of admission it is my clinical judgment that the patient will require inpatient hospital care spanning beyond 2 midnights from the point of admission due to high intensity of service, high risk for further deterioration and high frequency of surveillance required.*   For questions or updates, please contact [place] Please consult [email or web address] for contact info under     Signed, Delight Hoh, MD  10/09/2021 12:39 AM

## 2021-10-09 NOTE — Progress Notes (Signed)
Progress Note  Patient Name: Brooke [name] Date of Encounter: 10/09/2021  [facility] Cardiologist: None Jarrick Fjeld new  Subjective   BiPAP, laying minimally upright breathing more comfortably.  Ill-appearing, thin but conversant.  Inpatient Medications    Scheduled Meds:  aspirin EC  325 mg Oral Daily   captopril  6.25 mg Oral TID   enoxaparin (LOVENOX) injection  30 mg Subcutaneous Q24H   furosemide  60 mg Intravenous Q6H   pantoprazole  40 mg Oral Daily   sodium chloride flush  3 mL Intravenous Q12H   Continuous Infusions:  sodium chloride     milrinone 0.25 mcg/kg/min (10/09/21 0212)   PRN Meds: sodium chloride, acetaminophen, ALPRAZolam, diazepam, ondansetron (ZOFRAN) IV, sodium chloride flush   Vital Signs    Vitals:   10/09/21 0430 10/09/21 0500 10/09/21 0600 10/09/21 0715  BP: 121/78 133/87 (!) 141/89 123/87  Pulse: 87 89 92 80  Resp: (!) 24 (!) 22 19 19   Temp: 98 F (36.7 C)     TempSrc: Axillary     SpO2: 100% 100% 100% 100%  Weight:      Height:        Intake/Output Summary (Last 24 hours) at 10/09/2021 0937 Last data filed at 10/09/2021 0813 Gross per 24 hour  Intake --  Output 1750 ml  Net -1750 ml   Last 3 Weights 10/08/2021 03/17/2021 12/11/2020  Weight (lbs) 105 lb 13.1 oz 106 lb 109 lb  Weight (kg) 48 kg 48.081 kg 49.442 kg      Telemetry    Sinus rhythm/sinus tachycardia 100-120- Personally Reviewed  ECG    Sinus tachycardia nonspecific ST-T wave changes- Personally Reviewed  Physical Exam   GEN: Thin, BiPAP, in mild to moderate distress Neck: Midneck JVD Cardiac: Sinus tachycardia, no murmurs, rubs, or gallops.  Cold extremities Respiratory: Decreased breath sounds at bases bilaterally. GI: Soft, nontender, non-distended thin MS: 2+ lower extremity edema; No deformity. Neuro:  Nonfocal  Psych: Normal affect   Labs    High Sensitivity Troponin:   Recent Labs  Lab 10/08/21 1947 10/08/21 2150 10/08/21 2305 10/09/21 0812   TROPONINIHS 35* 44* 58* 135*     Chemistry Recent Labs  Lab 10/08/21 1947 10/08/21 2305  NA 134* 135  K 3.5 3.4*  CL 97* 96*  CO2 23 24  GLUCOSE 341* 330*  BUN 8 8  CREATININE 0.70 0.79  CALCIUM 9.1 8.5*  MG  --  1.6*  PROT 8.1  --   ALBUMIN 3.7  --   AST 22  --   ALT 13  --   ALKPHOS 120  --   BILITOT 1.0  --   GFRNONAA >60 >60  ANIONGAP 14 15    Lipids No results for input(s): CHOL, TRIG, HDL, LABVLDL, LDLCALC, CHOLHDL in the last 168 hours.  Hematology Recent Labs  Lab 10/08/21 1947 10/08/21 2305  WBC 13.3* 12.5*  RBC 5.89* 5.43*  HGB 14.6 13.5  HCT 45.2 42.4  MCV 76.7* 78.1*  MCH 24.8* 24.9*  MCHC 32.3 31.8  RDW 13.8 13.9  PLT 316 311   Thyroid  Recent Labs  Lab 10/09/21 0056  TSH 2.060    BNP Recent Labs  Lab 10/08/21 1947  BNP 1,373.8*    DDimer No results for input(s): DDIMER in the last 168 hours.   Radiology    CT Angio Chest PE W and/or Wo Contrast  Result Date: 10/08/2021 CLINICAL DATA:  Pulmonary embolism (PE) suspected, unknown D-dimer. reports CP and SOB  w/ non-productive cough since 3:00pm today, also reports LE edema that has been ongoing. States she cannot get enough air, RR 34, O2 94% RA. Also reports blood in urine for the past 6 months. EXAM: CT ANGIOGRAPHY CHEST WITH CONTRAST TECHNIQUE: Multidetector CT imaging of the chest was performed using the standard protocol during bolus administration of intravenous contrast. Multiplanar CT image reconstructions and MIPs were obtained to evaluate the vascular anatomy. RADIATION DOSE REDUCTION: This exam was performed according to the departmental dose-optimization program which includes automated exposure control, adjustment of the mA and/or kV according to patient size and/or use of iterative reconstruction technique. CONTRAST:  66mL OMNIPAQUE IOHEXOL 350 MG/ML SOLN COMPARISON:  None. FINDINGS: Cardiovascular: Satisfactory opacification of the pulmonary arteries to the segmental level. No  evidence of pulmonary embolism. The main pulmonary artery is normal in caliber. Normal heart size. No significant pericardial effusion. The thoracic aorta is normal in caliber. At least mild atherosclerotic plaque of the thoracic aorta. Four-vessel coronary artery calcifications. Mediastinum/Nodes: No enlarged mediastinal, hilar, or axillary lymph nodes. Thyroid gland, trachea, and esophagus demonstrate no significant findings. Lungs/Pleura: Limited evaluation due to respiratory motion artifact. Partial collapse of bilateral lower lobes in the right middle lobe. Passive atelectasis of the right upper lobe and lingula. No focal consolidation. No pulmonary nodule. No pulmonary mass. Bilateral moderate volume pleural effusions. No pneumothorax. Upper Abdomen: No acute abnormality. Musculoskeletal: No abdominal wall hernia or abnormality. No suspicious lytic or blastic osseous lesions. No acute displaced fracture. Multilevel degenerative changes of the spine. Review of the MIP images confirms the above findings. IMPRESSION: 1. No pulmonary embolus. 2. Bilateral moderate pleural effusions. 3. Aortic Atherosclerosis (ICD10-I70.0) including four-vessel coronary calcifications. Electronically Signed   By: Iven Finn M.D.   On: 10/08/2021 21:59   DG Chest Port 1 View  Result Date: 10/08/2021 CLINICAL DATA:  Shortness of breath, chest pain EXAM: PORTABLE CHEST 1 VIEW COMPARISON:  11/08/2011 FINDINGS: Small right pleural effusion and moderate left pleural effusion. Bibasilar atelectasis or infiltrates. Heart is normal size. No effusions. IMPRESSION: Bilateral effusions, left larger than right. Bibasilar atelectasis or infiltrates. Electronically Signed   By: Rolm Baptise M.D.   On: 10/08/2021 19:40    Cardiac Studies   Preliminary echocardiogram EF 20 to 25%.  No evidence of LV thrombus.  No evidence of significant valvular abnormalities.  Patient Profile     61 y.o. female here with cardiogenic shock, acute  HFrEF, chronic pelvic floor dysfunction followed by GI, protein calorie malnutrition BMI 17.8  Assessment & Plan    Cardiogenic shock/acute HFrEF -She is n.p.o. for right and left heart catheterization.  Risk and benefits of been explained including stroke heart attack death renal impairment bleeding.  Her creatinine currently is 0.79.  BNP 1373 she is willing to proceed. -Continue with milrinone 0.25 -Exam cold extremities. -6 weeks of shortness of breath, progression.  Also describes chest pain off and on over the last several weeks.  No current pain.  Moderate pleural effusions noted.  Her father had MI in his 2s.  No smoking. -Currently on captopril 6.25 mg 3 times a day for afterload reduction. -Furosemide 60 mg IV 3 times a day -Discussed case with our interventional team, Dr. Ali Lowe.  Type II myocardial infarction - Troponin 135 up from 58 in the setting of cardiogenic shock.  This is likely secondary to supply demand mismatch.  We will perform left heart catheterization.  Chest CT personally reviewed does show coronary artery calcifications present.  She may  in fact have an ischemic cardiomyopathy potentially.  Hypoxic respiratory failure - Currently on BiPAP.  Breathing better after approximately 2 L diuresis assisted with milrinone.  Should be able to lay down with minimal elevation for heart catheterization.  Generalized anxiety - Alprazolam 0.25 mg as needed, diazepam 5 mg daily as needed.  She has used intravaginally for pelvic floor dysfunction in the past.  Elevated glucose - 300.  Insulin sliding scale  CRITICAL CARE Performed by: Candee Furbish   Total critical care time: 55 minutes  Critical care time was exclusive of separately billable procedures and treating other patients.  Critical care was necessary to treat or prevent imminent or life-threatening deterioration.  Critical care was time spent personally by me on the following activities: development of  treatment plan with patient and/or surrogate as well as nursing, discussions with consultants, evaluation of patient's response to treatment, examination of patient, obtaining history from patient or surrogate, ordering and performing treatments and interventions, ordering and review of laboratory studies, ordering and review of radiographic studies, pulse oximetry and re-evaluation of patient's condition.   For questions or updates, please contact [place] Please consult [email or web address] for contact info under        Signed, Candee Furbish, MD  10/09/2021, 9:37 AM

## 2021-10-09 NOTE — ED Notes (Signed)
Bi pap removed per resp tx.  ?

## 2021-10-09 NOTE — H&P (View-Only) (Signed)
Progress Note  Patient Name: Brooke [name] Date of Encounter: 10/09/2021  [facility] Cardiologist: None Freddy Kinne new  Subjective   BiPAP, laying minimally upright breathing more comfortably.  Ill-appearing, thin but conversant.  Inpatient Medications    Scheduled Meds:  aspirin EC  325 mg Oral Daily   captopril  6.25 mg Oral TID   enoxaparin (LOVENOX) injection  30 mg Subcutaneous Q24H   furosemide  60 mg Intravenous Q6H   pantoprazole  40 mg Oral Daily   sodium chloride flush  3 mL Intravenous Q12H   Continuous Infusions:  sodium chloride     milrinone 0.25 mcg/kg/min (10/09/21 0212)   PRN Meds: sodium chloride, acetaminophen, ALPRAZolam, diazepam, ondansetron (ZOFRAN) IV, sodium chloride flush   Vital Signs    Vitals:   10/09/21 0430 10/09/21 0500 10/09/21 0600 10/09/21 0715  BP: 121/78 133/87 (!) 141/89 123/87  Pulse: 87 89 92 80  Resp: (!) 24 (!) 22 19 19   Temp: 98 F (36.7 C)     TempSrc: Axillary     SpO2: 100% 100% 100% 100%  Weight:      Height:        Intake/Output Summary (Last 24 hours) at 10/09/2021 0937 Last data filed at 10/09/2021 0813 Gross per 24 hour  Intake --  Output 1750 ml  Net -1750 ml   Last 3 Weights 10/08/2021 03/17/2021 12/11/2020  Weight (lbs) 105 lb 13.1 oz 106 lb 109 lb  Weight (kg) 48 kg 48.081 kg 49.442 kg      Telemetry    Sinus rhythm/sinus tachycardia 100-120- Personally Reviewed  ECG    Sinus tachycardia nonspecific ST-T wave changes- Personally Reviewed  Physical Exam   GEN: Thin, BiPAP, in mild to moderate distress Neck: Midneck JVD Cardiac: Sinus tachycardia, no murmurs, rubs, or gallops.  Cold extremities Respiratory: Decreased breath sounds at bases bilaterally. GI: Soft, nontender, non-distended thin MS: 2+ lower extremity edema; No deformity. Neuro:  Nonfocal  Psych: Normal affect   Labs    High Sensitivity Troponin:   Recent Labs  Lab 10/08/21 1947 10/08/21 2150 10/08/21 2305 10/09/21 0812   TROPONINIHS 35* 44* 58* 135*     Chemistry Recent Labs  Lab 10/08/21 1947 10/08/21 2305  NA 134* 135  K 3.5 3.4*  CL 97* 96*  CO2 23 24  GLUCOSE 341* 330*  BUN 8 8  CREATININE 0.70 0.79  CALCIUM 9.1 8.5*  MG  --  1.6*  PROT 8.1  --   ALBUMIN 3.7  --   AST 22  --   ALT 13  --   ALKPHOS 120  --   BILITOT 1.0  --   GFRNONAA >60 >60  ANIONGAP 14 15    Lipids No results for input(s): CHOL, TRIG, HDL, LABVLDL, LDLCALC, CHOLHDL in the last 168 hours.  Hematology Recent Labs  Lab 10/08/21 1947 10/08/21 2305  WBC 13.3* 12.5*  RBC 5.89* 5.43*  HGB 14.6 13.5  HCT 45.2 42.4  MCV 76.7* 78.1*  MCH 24.8* 24.9*  MCHC 32.3 31.8  RDW 13.8 13.9  PLT 316 311   Thyroid  Recent Labs  Lab 10/09/21 0056  TSH 2.060    BNP Recent Labs  Lab 10/08/21 1947  BNP 1,373.8*    DDimer No results for input(s): DDIMER in the last 168 hours.   Radiology    CT Angio Chest PE W and/or Wo Contrast  Result Date: 10/08/2021 CLINICAL DATA:  Pulmonary embolism (PE) suspected, unknown D-dimer. reports CP and SOB  w/ non-productive cough since 3:00pm today, also reports LE edema that has been ongoing. States she cannot get enough air, RR 34, O2 94% RA. Also reports blood in urine for the past 6 months. EXAM: CT ANGIOGRAPHY CHEST WITH CONTRAST TECHNIQUE: Multidetector CT imaging of the chest was performed using the standard protocol during bolus administration of intravenous contrast. Multiplanar CT image reconstructions and MIPs were obtained to evaluate the vascular anatomy. RADIATION DOSE REDUCTION: This exam was performed according to the departmental dose-optimization program which includes automated exposure control, adjustment of the mA and/or kV according to patient size and/or use of iterative reconstruction technique. CONTRAST:  61mL OMNIPAQUE IOHEXOL 350 MG/ML SOLN COMPARISON:  None. FINDINGS: Cardiovascular: Satisfactory opacification of the pulmonary arteries to the segmental level. No  evidence of pulmonary embolism. The main pulmonary artery is normal in caliber. Normal heart size. No significant pericardial effusion. The thoracic aorta is normal in caliber. At least mild atherosclerotic plaque of the thoracic aorta. Four-vessel coronary artery calcifications. Mediastinum/Nodes: No enlarged mediastinal, hilar, or axillary lymph nodes. Thyroid gland, trachea, and esophagus demonstrate no significant findings. Lungs/Pleura: Limited evaluation due to respiratory motion artifact. Partial collapse of bilateral lower lobes in the right middle lobe. Passive atelectasis of the right upper lobe and lingula. No focal consolidation. No pulmonary nodule. No pulmonary mass. Bilateral moderate volume pleural effusions. No pneumothorax. Upper Abdomen: No acute abnormality. Musculoskeletal: No abdominal wall hernia or abnormality. No suspicious lytic or blastic osseous lesions. No acute displaced fracture. Multilevel degenerative changes of the spine. Review of the MIP images confirms the above findings. IMPRESSION: 1. No pulmonary embolus. 2. Bilateral moderate pleural effusions. 3. Aortic Atherosclerosis (ICD10-I70.0) including four-vessel coronary calcifications. Electronically Signed   By: Iven Finn M.D.   On: 10/08/2021 21:59   DG Chest Port 1 View  Result Date: 10/08/2021 CLINICAL DATA:  Shortness of breath, chest pain EXAM: PORTABLE CHEST 1 VIEW COMPARISON:  11/08/2011 FINDINGS: Small right pleural effusion and moderate left pleural effusion. Bibasilar atelectasis or infiltrates. Heart is normal size. No effusions. IMPRESSION: Bilateral effusions, left larger than right. Bibasilar atelectasis or infiltrates. Electronically Signed   By: Rolm Baptise M.D.   On: 10/08/2021 19:40    Cardiac Studies   Preliminary echocardiogram EF 20 to 25%.  No evidence of LV thrombus.  No evidence of significant valvular abnormalities.  Patient Profile     61 y.o. female here with cardiogenic shock, acute  HFrEF, chronic pelvic floor dysfunction followed by GI, protein calorie malnutrition BMI 17.8  Assessment & Plan    Cardiogenic shock/acute HFrEF -She is n.p.o. for right and left heart catheterization.  Risk and benefits of been explained including stroke heart attack death renal impairment bleeding.  Her creatinine currently is 0.79.  BNP 1373 she is willing to proceed. -Continue with milrinone 0.25 -Exam cold extremities. -6 weeks of shortness of breath, progression.  Also describes chest pain off and on over the last several weeks.  No current pain.  Moderate pleural effusions noted.  Her father had MI in his 9s.  No smoking. -Currently on captopril 6.25 mg 3 times a day for afterload reduction. -Furosemide 60 mg IV 3 times a day -Discussed case with our interventional team, Dr. Ali Lowe.  Type II myocardial infarction - Troponin 135 up from 58 in the setting of cardiogenic shock.  This is likely secondary to supply demand mismatch.  We will perform left heart catheterization.  Chest CT personally reviewed does show coronary artery calcifications present.  She may  in fact have an ischemic cardiomyopathy potentially.  Hypoxic respiratory failure - Currently on BiPAP.  Breathing better after approximately 2 L diuresis assisted with milrinone.  Should be able to lay down with minimal elevation for heart catheterization.  Generalized anxiety - Alprazolam 0.25 mg as needed, diazepam 5 mg daily as needed.  She has used intravaginally for pelvic floor dysfunction in the past.  Elevated glucose - 300.  Insulin sliding scale  CRITICAL CARE Performed by: Candee Furbish   Total critical care time: 55 minutes  Critical care time was exclusive of separately billable procedures and treating other patients.  Critical care was necessary to treat or prevent imminent or life-threatening deterioration.  Critical care was time spent personally by me on the following activities: development of  treatment plan with patient and/or surrogate as well as nursing, discussions with consultants, evaluation of patient's response to treatment, examination of patient, obtaining history from patient or surrogate, ordering and performing treatments and interventions, ordering and review of laboratory studies, ordering and review of radiographic studies, pulse oximetry and re-evaluation of patient's condition.   For questions or updates, please contact [place] Please consult [email or web address] for contact info under        Signed, Candee Furbish, MD  10/09/2021, 9:37 AM

## 2021-10-09 NOTE — Consult Note (Addendum)
Advanced Heart Failure Team Consult Note   Primary Physician: Patient, No Pcp Per (Inactive) PCP-Cardiologist:  Candee Furbish, MD  Reason for Consultation: acute biventricular heart failure>>cardiogenic shock   HPI:    Brooke [name] is seen today for evaluation of cardiogenic shock at the request of Dr. Ali Lowe, Cardiology.   61 y/o female w/ reported h/o DM but not on medications, chronic pelvic floor dysfunction and protein calorie malnutrition (Body mass index is 17.88 kg/m.) but no prior cardiac history. No routien preventative care. Has not seen a PCP nor been on medications for > 2 years due to issues w/ her insurance.   Presented to the ED overnight w/ complaints of progressive CP and exertional dyspnea. Started several weeks ago.     She was tachypneic/hypoxic on arrival requiring BiPAP w/ exam concerning for acute CHF and cardiogenic shock. Noted to be markedly fluid overloaded and cold on exam. Laboratory findings also c/w shock. Lactic acid 4.4. BNP 1,373. HS trop 35>>44>>135>>135. Renal indices and LFTs actually normal. CXR showed bilateral effusions L>R + bibasilar atelectasis vs infiltrates. Chest CT negative for PE, + for moderate b/l pleural effusions and multivessel coronary artery calcifications.   She was started on empiric milrinone 0.25 and given IV Lasix. 2D echo showed severe biventricular dysfunction, LVEF 20-25%, GIIDD, RV moderately reduced. Trivial MR.  Went to cath lab today, LHC showed severe multivessel disease w/ severe LV dysfunction. RHC (on milrinone 0.25 mg/kg/min) showed low filling pressures w/ Fick cardiac output of 5.2 L/min and Fick cardiac index of 3.4 L/min, PA sat 68%, however Thermodilution outputs done in triplicate demonstrated a cardiac output of 3 L/min and an index of 2 L/min/m. PAPI 1.8. Subsequently milrinone was increased to 0.5 mcg/kg/min. AHF team consulted to assist w/ further management.   She remains in cath lab holding, awaiting  2H bed. Denies any current dyspnea. No resting CP.     2D Echo 3/2 LVEF 20-25%, RV moderately reduced   R/LHC 3/2   Prox RCA lesion is 100% stenosed.   Ost LM lesion is 60% stenosed.   Mid LAD lesion is 80% stenosed.   2nd Diag lesion is 85% stenosed.   1st Diag lesion is 99% stenosed.   Prox LAD lesion is 80% stenosed.   Mid Cx lesion is 70% stenosed.   The left ventricular ejection fraction is 25-35% by visual estimate.   1.  Severe multivessel disease. 2.  Severe LV dysfunction 3.  Fick cardiac output of 5.2 L/min and Fick cardiac index of 3.4 L a minute per meter squared with a PA saturation of 68%. 4.  Thermodilution outputs done in triplicate demonstrated a cardiac output of 3 L/min and an index of 2 L/min/m; milrinone was increased to 2.5 mcg per kilogram per minute. 5.  Cardiac hemodynamics as follows:   A.  Right atrial pressure 9/7 with a mean of 5 mmHg B.  RV 38/3 with an end-diastolic pressure of 18 mmHg C.  Wedge pressure of 12/11 with a mean of 10 mmHg D.  Pulmonary artery pressure of 23/12 with a mean pressure of 17 mmHg; PAPI =9/5=1.8 E.  LVEDP of 79mmHg.   Review of Systems: [y] = yes, [ ]  = no   General: Weight gain [ ] ; Weight loss [ ] ; Anorexia [ ] ; Fatigue [ ] ; Fever [ ] ; Chills [ ] ; Weakness [ ]   Cardiac: Chest pain/pressure [Y ]; Resting SOB [ ] ; Exertional SOB [ Y]; Orthopnea [ ] ; Pedal Edema [ ] ;  Palpitations [ ] ; Syncope [ ] ; Presyncope [ ] ; Paroxysmal nocturnal dyspnea[ ]   Pulmonary: Cough [ ] ; Wheezing[ ] ; Hemoptysis[ ] ; Sputum [ ] ; Snoring [ ]   GI: Vomiting[ ] ; Dysphagia[ ] ; Melena[ ] ; Hematochezia [ ] ; Heartburn[ ] ; Abdominal pain [ ] ; Constipation [ ] ; Diarrhea [ ] ; BRBPR [ ]   GU: Hematuria[ ] ; Dysuria [ ] ; Nocturia[ ]   Vascular: Pain in legs with walking [ ] ; Pain in feet with lying flat [ ] ; Non-healing sores [ ] ; Stroke [ ] ; TIA [ ] ; Slurred speech [ ] ;  Neuro: Headaches[ ] ; Vertigo[ ] ; Seizures[ ] ; Paresthesias[ ] ;Blurred vision [ ] ; Diplopia [  ]; Vision changes [ ]   Ortho/Skin: Arthritis [ ] ; Joint pain [ ] ; Muscle pain [ ] ; Joint swelling [ ] ; Back Pain [ ] ; Rash [ ]   Psych: Depression[ ] ; Anxiety[ ]   Heme: Bleeding problems [ ] ; Clotting disorders [ ] ; Anemia [ ]   Endocrine: Diabetes [ Y]; Thyroid dysfunction[ ]   Home Medications Prior to Admission medications   Medication Sig Start Date End Date Taking? Authorizing Provider  acetaminophen (TYLENOL) 500 MG tablet Take 1,000 mg by mouth every 6 (six) hours as needed for mild pain, fever or headache.   Yes [provider]  ALPRAZolam (XANAX) 0.25 MG tablet Take 1 tablet (0.25 mg total) by mouth daily as needed for anxiety. 06/28/20  Yes [name], MD  Aspirin-Acetaminophen-Caffeine (PAMPRIN MAX PO) Take 1 tablet by mouth 2 (two) times daily as needed (pelvic pain).   Yes [provider]  cholecalciferol (VITAMIN D3) 25 MCG (1000 UNIT) tablet Take 4,000 Units by mouth daily.   Yes [provider]  diazepam (VALIUM) 5 MG tablet Place 1 tablet vaginally nightly as needed for muscle spasm/ pelvic pain. 03/17/21  Yes Jaquita Folds, MD  diphenhydrAMINE (BENADRYL) 25 MG tablet Take 50 mg by mouth every 6 (six) hours as needed for itching or allergies.   Yes [provider]  vitamin B-12 (CYANOCOBALAMIN) 1000 MCG tablet Take 3,000 mcg by mouth daily.   Yes [provider]  estradiol (ESTRACE) 0.1 MG/GM vaginal cream Place 0.5 g vaginally 2 (two) times a week. Place 0.5g nightly for two weeks then twice a week after Patient not taking: Reported on 10/09/2021 03/17/21   Jaquita Folds, MD    Past Medical History: Past Medical History:  Diagnosis Date   Abdominal pain, left lower quadrant 12/17/2015   Chest pain, unspecified 10/05/2003   Depressive disorder, not elsewhere classified 07/31/2005   Diabetes mellitus without complication ([place]) 29/92/4268   Endometriosis 09/14/2012   Hypertension 11/23/2003   Unspecified  constipation 06/24/2004    Past Surgical History: Past Surgical History:  Procedure Laterality Date   APPENDECTOMY  1996   Dr. Lindon Romp   BREAST SURGERY Bilateral 1996   Dr. Haskell Riling HERNIA REPAIR     OVARIAN CYST REMOVAL      Family History: Family History  Problem Relation Age of Onset   Diabetes Mother    Hypertension Mother    Arthritis Mother        RA   Pancreatic cancer Father    Stomach cancer Neg Hx    Colon cancer Neg Hx    Esophageal cancer Neg Hx    Rectal cancer Neg Hx     Social History: Social History   Socioeconomic History   Marital status: Married    Spouse name: Not on file   Number of children: Not on file   Years  of education: Not on file   Highest education level: Not on file  Occupational History   Not on file  Tobacco Use   Smoking status: Never   Smokeless tobacco: Never  Vaping Use   Vaping Use: Never used  Substance and Sexual Activity   Alcohol use: No   Drug use: No   Sexual activity: Not Currently  Other Topics Concern   Not on file  Social History Narrative   Not on file   Social Determinants of Health   Financial Resource Strain: Not on file  Food Insecurity: Not on file  Transportation Needs: Not on file  Physical Activity: Not on file  Stress: Not on file  Social Connections: Not on file    Allergies:  Allergies  Allergen Reactions   Aspirin     Intolerance (aspirin sensitive stomach)   Darvocet [Propoxyphene N-Acetaminophen]    Erythromycin    Gabapentin Other (See Comments)    Suicidal ideations    Ibuprofen    Metformin And Related Nausea Only    Objective:    Vital Signs:   Temp:  [98 F (36.7 C)-98.4 F (36.9 C)] 98.4 F (36.9 C) (03/02 1420) Pulse Rate:  [41-138] 88 (03/02 1420) Resp:  [15-41] 26 (03/02 1420) BP: (80-161)/(57-109) 118/75 (03/02 1410) SpO2:  [63 %-100 %] 97 % (03/02 1420) FiO2 (%):  [40 %-50 %] 40 % (03/02 0900) Weight:  [48 kg] 48 kg (03/01 1807)    Weight  change: Filed Weights   10/08/21 1807  Weight: 48 kg    Intake/Output:   Intake/Output Summary (Last 24 hours) at 10/09/2021 1439 Last data filed at 10/09/2021 1350 Gross per 24 hour  Intake --  Output 1950 ml  Net -1950 ml      Physical Exam    General:  thin appearing female, looks older than actual age. No resp difficulty HEENT: normal + rt IJ Swan  Neck: supple. JVP not elevated . Carotids 2+ bilat; no bruits. No lymphadenopathy or thyromegaly appreciated. Cor: PMI nondisplaced. Regular rate & rhythm. No rubs, gallops or murmurs. Lungs: clear Abdomen: soft, nontender, nondistended. No hepatosplenomegaly. No bruits or masses. Good bowel sounds. Extremities: no cyanosis, clubbing, rash, trace b/l ankle edema Neuro: alert & orientedx3, cranial nerves grossly intact. moves all 4 extremities w/o difficulty. Affect pleasant   Telemetry   NSR 80s personally reviewed   EKG    Sinus tachycardia 109 bpm   Labs   Basic Metabolic Panel: Recent Labs  Lab 10/08/21 1947 10/08/21 2305  NA 134* 135  K 3.5 3.4*  CL 97* 96*  CO2 23 24  GLUCOSE 341* 330*  BUN 8 8  CREATININE 0.70 0.79  CALCIUM 9.1 8.5*  MG  --  1.6*    Liver Function Tests: Recent Labs  Lab 10/08/21 1947  AST 22  ALT 13  ALKPHOS 120  BILITOT 1.0  PROT 8.1  ALBUMIN 3.7   No results for input(s): LIPASE, AMYLASE in the last 168 hours. No results for input(s): AMMONIA in the last 168 hours.  CBC: Recent Labs  Lab 10/08/21 1947 10/08/21 2305  WBC 13.3* 12.5*  NEUTROABS 11.4*  --   HGB 14.6 13.5  HCT 45.2 42.4  MCV 76.7* 78.1*  PLT 316 311    Cardiac Enzymes: No results for input(s): CKTOTAL, CKMB, CKMBINDEX, TROPONINI in the last 168 hours.  BNP: BNP (last 3 results) Recent Labs    10/08/21 1947  BNP 1,373.8*    ProBNP (last 3  results) No results for input(s): PROBNP in the last 8760 hours.   CBG: Recent Labs  Lab 10/09/21 1215  GLUCAP 194*    Coagulation  Studies: Recent Labs    10/08/21 2305  LABPROT 16.6*  INR 1.3*     Imaging   CT Angio Chest PE W and/or Wo Contrast  Result Date: 10/08/2021 CLINICAL DATA:  Pulmonary embolism (PE) suspected, unknown D-dimer. reports CP and SOB w/ non-productive cough since 3:00pm today, also reports LE edema that has been ongoing. States she cannot get enough air, RR 34, O2 94% RA. Also reports blood in urine for the past 6 months. EXAM: CT ANGIOGRAPHY CHEST WITH CONTRAST TECHNIQUE: Multidetector CT imaging of the chest was performed using the standard protocol during bolus administration of intravenous contrast. Multiplanar CT image reconstructions and MIPs were obtained to evaluate the vascular anatomy. RADIATION DOSE REDUCTION: This exam was performed according to the departmental dose-optimization program which includes automated exposure control, adjustment of the mA and/or kV according to patient size and/or use of iterative reconstruction technique. CONTRAST:  55mL OMNIPAQUE IOHEXOL 350 MG/ML SOLN COMPARISON:  None. FINDINGS: Cardiovascular: Satisfactory opacification of the pulmonary arteries to the segmental level. No evidence of pulmonary embolism. The main pulmonary artery is normal in caliber. Normal heart size. No significant pericardial effusion. The thoracic aorta is normal in caliber. At least mild atherosclerotic plaque of the thoracic aorta. Four-vessel coronary artery calcifications. Mediastinum/Nodes: No enlarged mediastinal, hilar, or axillary lymph nodes. Thyroid gland, trachea, and esophagus demonstrate no significant findings. Lungs/Pleura: Limited evaluation due to respiratory motion artifact. Partial collapse of bilateral lower lobes in the right middle lobe. Passive atelectasis of the right upper lobe and lingula. No focal consolidation. No pulmonary nodule. No pulmonary mass. Bilateral moderate volume pleural effusions. No pneumothorax. Upper Abdomen: No acute abnormality. Musculoskeletal: No  abdominal wall hernia or abnormality. No suspicious lytic or blastic osseous lesions. No acute displaced fracture. Multilevel degenerative changes of the spine. Review of the MIP images confirms the above findings. IMPRESSION: 1. No pulmonary embolus. 2. Bilateral moderate pleural effusions. 3. Aortic Atherosclerosis (ICD10-I70.0) including four-vessel coronary calcifications. Electronically Signed   By: Iven Finn M.D.   On: 10/08/2021 21:59   CARDIAC CATHETERIZATION  Result Date: 10/09/2021   Prox RCA lesion is 100% stenosed.   Ost LM lesion is 60% stenosed.   Mid LAD lesion is 80% stenosed.   2nd Diag lesion is 85% stenosed.   1st Diag lesion is 99% stenosed.   Prox LAD lesion is 80% stenosed.   Mid Cx lesion is 70% stenosed.   The left ventricular ejection fraction is 25-35% by visual estimate. 1.  Severe multivessel disease. 2.  Severe LV dysfunction 3.  Fick cardiac output of 5.2 L/min and Fick cardiac index of 3.4 L a minute per meter squared with a PA saturation of 68%. 4.  Thermodilution outputs done in triplicate demonstrated a cardiac output of 3 L/min and an index of 2 L/min/m; milrinone was increased to 2.5 mcg per kilogram per minute. 5.  Cardiac hemodynamics as follows: A.  Right atrial pressure 9/7 with a mean of 5 mmHg B.  RV 38/3 with an end-diastolic pressure of 18 mmHg C.  Wedge pressure of 12/11 with a mean of 10 mmHg D.  Pulmonary artery pressure of 23/12 with a mean pressure of 17 mmHg; PAPI =9/5=1.8 E.  LVEDP of 68mmHg. The results were reviewed with Dr. Haroldine Laws.  The patient be transferred to the to heart unit for medical  optimization.  No mechanical circulatory support was pursued.   DG Chest Port 1 View  Result Date: 10/08/2021 CLINICAL DATA:  Shortness of breath, chest pain EXAM: PORTABLE CHEST 1 VIEW COMPARISON:  11/08/2011 FINDINGS: Small right pleural effusion and moderate left pleural effusion. Bibasilar atelectasis or infiltrates. Heart is normal size. No effusions.  IMPRESSION: Bilateral effusions, left larger than right. Bibasilar atelectasis or infiltrates. Electronically Signed   By: Rolm Baptise M.D.   On: 10/08/2021 19:40   ECHOCARDIOGRAM COMPLETE  Result Date: 10/09/2021    ECHOCARDIOGRAM REPORT   Patient Name:   Brooke [name] Date of Exam: 10/09/2021 Medical Rec #:  [id number]      Height:       64.5 in Accession #:    [id number]     Weight:       105.8 lb Date of Birth:  1960-10-11      BSA:          1.501 m Patient Age:    22 years       BP:           123/87 mmHg Patient Gender: F              HR:           80 bpm. Exam Location:  Inpatient Procedure: 2D Echo Indications:    Acute systolic CHF  History:        Patient has no prior history of Echocardiogram examinations.                 Signs/Symptoms:Chest Pain; Risk Factors:Diabetes and                 Hypertension.  Sonographer:    Arlyss Gandy Referring Phys: [id number] [place] T NIPP  Sonographer Comments: Supine. IMPRESSIONS  1. Left ventricular ejection fraction, by estimation, is 20 to 25%. The left ventricle has severely decreased function. The left ventricle demonstrates global hypokinesis with regional variation, particularly worse at the anterior apex and apex. Left ventricular diastolic parameters are consistent with Grade II diastolic dysfunction (pseudonormalization). Elevated left ventricular end-diastolic pressure. The E/e' is 21.  2. Right ventricular systolic function is moderately reduced. The right ventricular size is normal. Tricuspid regurgitation signal is inadequate for assessing PA pressure.  3. Left atrial size was mildly dilated.  4. The mitral valve is abnormal. Trivial mitral valve regurgitation.  5. The aortic valve is tricuspid. Aortic valve regurgitation is not visualized. Aortic valve sclerosis is present, with no evidence of aortic valve stenosis.  6. The inferior vena cava is dilated in size with <50% respiratory variability, suggesting right atrial pressure of 15 mmHg. Comparison(s):  No prior Echocardiogram. FINDINGS  Left Ventricle: Left ventricular ejection fraction, by estimation, is 20 to 25%. The left ventricle has severely decreased function. The left ventricle demonstrates global hypokinesis. The left ventricular internal cavity size was normal in size. There is no left ventricular hypertrophy. Left ventricular diastolic parameters are consistent with Grade II diastolic dysfunction (pseudonormalization). Elevated left ventricular end-diastolic pressure. The E/e' is 21. Right Ventricle: The right ventricular size is normal. No increase in right ventricular wall thickness. Right ventricular systolic function is moderately reduced. Tricuspid regurgitation signal is inadequate for assessing PA pressure. Left Atrium: Left atrial size was mildly dilated. Right Atrium: Right atrial size was normal in size. Pericardium: There is no evidence of pericardial effusion. Mitral Valve: The mitral valve is abnormal. There is mild thickening of the anterior and posterior mitral valve leaflet(s). There is  mild calcification of the mitral valve leaflet(s). Trivial mitral valve regurgitation. Tricuspid Valve: The tricuspid valve is grossly normal. Tricuspid valve regurgitation is not demonstrated. Aortic Valve: The aortic valve is tricuspid. Aortic valve regurgitation is not visualized. Aortic valve sclerosis is present, with no evidence of aortic valve stenosis. Aortic valve mean gradient measures 2.0 mmHg. Aortic valve peak gradient measures 3.1  mmHg. Aortic valve area, by VTI measures 1.65 cm. Pulmonic Valve: The pulmonic valve was normal in structure. Pulmonic valve regurgitation is not visualized. Aorta: The aortic root and ascending aorta are structurally normal, with no evidence of dilitation. Venous: The inferior vena cava is dilated in size with less than 50% respiratory variability, suggesting right atrial pressure of 15 mmHg. IAS/Shunts: No atrial level shunt detected by color flow Doppler.  Additional Comments: There is pleural effusion in both left and right lateral regions.  LEFT VENTRICLE PLAX 2D LVIDd:         4.50 cm   Diastology LVIDs:         4.10 cm   LV e' medial:    4.19 cm/s LV PW:         0.80 cm   LV E/e' medial:  23.2 LV IVS:        0.80 cm   LV e' lateral:   5.22 cm/s LVOT diam:     2.00 cm   LV E/e' lateral: 18.6 LV SV:         25 LV SV Index:   17 LVOT Area:     3.14 cm  RIGHT VENTRICLE            IVC RV Basal diam:  3.30 cm    IVC diam: 2.30 cm RV Mid diam:    3.30 cm RV S prime:     6.60 cm/s TAPSE (M-mode): 1.6 cm LEFT ATRIUM             Index        RIGHT ATRIUM           Index LA diam:        3.00 cm 2.00 cm/m   RA Area:     12.90 cm LA Vol (A2C):   21.7 ml 14.46 ml/m  RA Volume:   30.90 ml  20.59 ml/m LA Vol (A4C):   51.2 ml 34.12 ml/m LA Biplane Vol: 36.7 ml 24.46 ml/m  AORTIC VALVE AV Area (Vmax):    1.79 cm AV Area (Vmean):   1.68 cm AV Area (VTI):     1.65 cm AV Vmax:           88.00 cm/s AV Vmean:          59.200 cm/s AV VTI:            0.153 m AV Peak Grad:      3.1 mmHg AV Mean Grad:      2.0 mmHg LVOT Vmax:         50.20 cm/s LVOT Vmean:        31.600 cm/s LVOT VTI:          0.080 m LVOT/AV VTI ratio: 0.53  AORTA Ao Root diam: 3.10 cm Ao Asc diam:  3.10 cm MITRAL VALVE MV Area (PHT): 5.62 cm    SHUNTS MV Decel Time: 135 msec    Systemic VTI:  0.08 m MV E velocity: 97.10 cm/s  Systemic Diam: 2.00 cm MV A velocity: 74.80 cm/s MV E/A ratio:  1.30 Lyman Bishop MD Electronically signed  by Lyman Bishop MD Signature Date/Time: 10/09/2021/11:30:25 AM    Final      Medications:     Current Medications:  [MAR Hold] aspirin EC  325 mg Oral Daily   [MAR Hold] captopril  6.25 mg Oral TID   [MAR Hold] enoxaparin (LOVENOX) injection  30 mg Subcutaneous Q24H   [MAR Hold] furosemide  60 mg Intravenous Q6H   [MAR Hold] pantoprazole  40 mg Oral Daily   [MAR Hold] sodium chloride flush  3 mL Intravenous Q12H    Infusions:  [MAR Hold] sodium chloride     sodium  chloride 10 mL/hr at 10/09/21 1126   sodium chloride 10 mL/hr (10/09/21 1335)   sodium chloride 10 mL/hr (10/09/21 1334)   sodium chloride 10 mL/hr (10/09/21 1336)   sodium chloride 10 mL/hr (10/09/21 1337)   milrinone 0.5 mcg/kg/min (10/09/21 1355)      Patient Profile   61 y/o female w/ reported h/o DM but not on medications, chronic pelvic floor dysfunction and protein calorie malnutrition (Body mass index is 17.88 kg/m.) but no prior cardiac history. No routien preventative care. Has not seen a PCP nor been on medications for > 2 years due to issues w/ her insurance, admitted w/ new systolic heart failure and cardiogenic shock.   Echo EF 20-25%, RV moderately reduced  Assessment/Plan   Acute Biventricular Heart Failure>>Cardiogenic Shock  - New. Echo EF 20-25%, RV moderately reduced. Acidotic on admit, LA 4.4. Started on empiric milrinone + IV Lasix - ICM. LHC w/ severe diffuse MVCAD - RHC on Milrinone 0.25 w/ low filling pressures and low CO/CI by thermo, CO 3L/min, CI 2L/m. PAPI 1.8. Milrinone increased to 0.5. No indication for MCS currently - Continue milrinone. Wean as tolerated. Will follow swan hemodynamics and check Co-ox  - Follow LA for clearance, 4.0>>3.0>>2.1  - Hold IV diuretics (PWP 10, RAP 5). CVP 4  - Was started on an ACE-I on admit (Captopril). Will plan to d/c and switch to Losartan to help w/ eventual transition to Entresto - Add Spiro 12.5 mg daily  - Add Digoxin 0.125 mg daily    2. CAD - diffuse MV CAD -  Prox RCA lesion is 100% stenosed. (RPAV filled by collaterals from Dist Cx)  - Ost LM lesion is 60% stenosed.  - Mid LAD lesion is 80% stenosed.  - 2nd Diag lesion is 85% stenosed.  - 1st Diag lesion is 99% stenosed.  - Prox LAD lesion is 80% stenosed.  - Mid Cx lesion is 70% stenosed. -  Will need optimization of HF first, prior to potential intervention. Will review PCI options w/ IC team - on ASA 325 mg daily  - add high intensity statin.  Check FLP in AM - no ? blocker w/ low output HF    3. Diabetes  - not on meds PTA  - Hyperglycemic, gluc 300 range - SSI - check Hgb A1c   4. Hypokalemia - K 3.4 - supp w/ KCL - add spiro 12.5 mg - follow BMP   5. Possible PNA - CXR + b/l atelectasis vs infiltrates  - WBC 13K, AF  - initial PCT 0.21 - repeat PCT, if trending up will start abx     Length of Stay: 0  Lyda Jester, PA-C  10/09/2021, 2:39 PM  Advanced Heart Failure Team Pager [phone] (M-F; 7a - 5p)  Please contact [facility] for night-coverage after hours (4p -7a ) and weekends on [email or web address]  Agree with above.  61 y/o woman as above with h/o DM2 (no follow up), chronic malnutrition and pelvic floor dysfunction. No known h/o heart disease.  Presented to ER with CP and progressive SOB.Marland Kitchen Found to be in cardiogenic shock with lactic acid 4.0 and biventricular dysfunction on echo EF 20-25. Hs trop 44. Ecg non-acute  Started on milrinone and taken to cath lab. Cath with severely calcified 3v CAD including 60% LM, CTO RCA, pLAD 80% with diffuse severely calcified disease throughout vessel. Hemodynamic improved on milrinone. We discussed in cath lab and mechanical support not felt to be needed at this point.   General:  Thin weak appearing. No resp difficulty HEENT: normal Neck: supple. no JVD. Carotids 2+ bilat; no bruits. No lymphadenopathy or thryomegaly appreciated. Cor: Regular rate & rhythm. No rubs, gallops or murmurs. Lungs: clear Abdomen: soft, nontender, nondistended. No hepatosplenomegaly. No bruits or masses. Good bowel sounds. Extremities: no cyanosis, clubbing, rash, edema Neuro: alert & orientedx3, cranial nerves grossly intact. moves all 4 extremities w/o difficulty. Affect pleasant  She has severe 3v CAD with severe iCM.  Ideally needs CABG but she would be high risk with low EF, uncontrolled DM2, malnutrition and heavily calcified CAD with limited targets.   Continue milrinone, Start  GDMT,   Will check hgba1c, prealbumin and consult TCTS to see if she is surgical candidate. Percutaneous options are limited.   CRITICAL CARE Performed by: Glori Bickers  Total critical care time: 55 minutes  Critical care time was exclusive of separately billable procedures and treating other patients.  Critical care was necessary to treat or prevent imminent or life-threatening deterioration.  Critical care was time spent personally by me (independent of midlevel providers or residents) on the following activities: development of treatment plan with patient and/or surrogate as well as nursing, discussions with consultants, evaluation of patient's response to treatment, examination of patient, obtaining history from patient or surrogate, ordering and performing treatments and interventions, ordering and review of laboratory studies, ordering and review of radiographic studies, pulse oximetry and re-evaluation of patient's condition.  Glori Bickers, MD  9:44 PM

## 2021-10-09 NOTE — Progress Notes (Signed)
AHF rounding team consulted/following this admission. ? ?Kevan Rosebush, RN, BSN, CHFN ?Heart Failure Navigator ?Heart & Vascular Care Navigation Team ? ?

## 2021-10-09 NOTE — Progress Notes (Addendum)
Received patient  from [facility] ER via stretcher, alert and oriented X4, skin warm and dry resp even and unlabored.  Pt denies any CP at this time, placed on bedside monitor and noted IV Milrinone infusing and NS by gravity.  IVF placed on a pump, and report taken from Yoe.  BS rechecked, consent signed and pt went to Cath Lab for procedure. ?

## 2021-10-09 NOTE — Progress Notes (Signed)
?  Transition of Care (TOC) Screening Note ? ? ?Patient Details  ?Name: Brooke [name] ?Date of Birth: 1960/08/17 ? ? ?Transition of Care (TOC) CM/SW Contact:    ?Khushbu Pippen, LCSW ?Phone Number: ?10/09/2021, 6:07 PM ? ? ? ?Transition of Care Department [facility]) has reviewed patient and no TOC needs have been identified at this time. We will continue to monitor patient advancement through interdisciplinary progression rounds. Patient will benefit from PT/OT consult for disposition recommendations. If new patient transition needs arise, please place a TOC consult. ?  ?

## 2021-10-09 NOTE — ED Notes (Signed)
Verbal report given to Craig Guess RN at this time ?

## 2021-10-09 NOTE — Progress Notes (Signed)
ANTICOAGULATION CONSULT NOTE - Initial Consult ? ?Pharmacy Consult for Heparin ?Indication: chest pain/ACS ? ?Allergies  ?Allergen Reactions  ? Aspirin   ?  Intolerance (aspirin sensitive stomach)  ? Darvocet [Propoxyphene N-Acetaminophen]   ? Erythromycin   ? Gabapentin Other (See Comments)  ?  Suicidal ideations   ? Ibuprofen   ? Metformin And Related Nausea Only  ? ? ?Patient Measurements: ?Height: 5' 4.5" (163.8 cm) ?Weight: 48 kg (105 lb 13.1 oz) ?IBW/kg (Calculated) : 55.85 ?Heparin Dosing Weight: 48 kg  ? ?Vital Signs: ?Temp: 98.4 ?F (36.9 ?C) (03/02 1420) ?Temp Source: Temporal (03/02 1420) ?BP: 117/60 (03/02 1455) ?Pulse Rate: 87 (03/02 1455) ? ?Labs: ?Recent Labs  ?  10/08/21 ?1740 10/08/21 ?2150 10/08/21 ?2305 10/09/21 ?8144  ?HGB 14.6  --  13.5  --   ?HCT 45.2  --  42.4  --   ?PLT 316  --  311  --   ?LABPROT  --   --  16.6*  --   ?INR  --   --  1.3*  --   ?CREATININE 0.70  --  0.79  --   ?TROPONINIHS 35* 44* 58* 135*  ? ? ?Estimated Creatinine Clearance: 57.4 mL/min (by C-G formula based on SCr of 0.79 mg/dL). ? ? ?Medical History: ?Past Medical History:  ?Diagnosis Date  ? Abdominal pain, left lower quadrant 12/17/2015  ? Chest pain, unspecified 10/05/2003  ? Depressive disorder, not elsewhere classified 07/31/2005  ? Diabetes mellitus without complication ([place]) 81/85/6314  ? Endometriosis 09/14/2012  ? Hypertension 11/23/2003  ? Unspecified constipation 06/24/2004  ? ?Assessment: ?61 yo female admitted 10/08/2021. 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 Pharmacy consulted to resume heparin 8 hrs after sheath removal. Sheath removed at 1341.  ? ?Goal of Therapy:  ?Heparin level 0.3-0.7 units/ml ?Monitor platelets by anticoagulation protocol: Yes ?  ?Plan:  ?Start heparin at 600 units/hr at 2130  ?Check 6 hr heparin level ?Monitor heparin level, CBC and s/s of bleeding  ? ?Cristela Felt, PharmD, BCPS ?Clinical Pharmacist ?10/09/2021 3:25  PM ? ? ? ?

## 2021-10-09 NOTE — Interval H&P Note (Signed)
History and Physical Interval Note: ? ?10/09/2021 ?10:37 AM ? ?Brooke [name]  has presented today for surgery, with the diagnosis of chf.  The various methods of treatment have been discussed with the patient and family. After consideration of risks, benefits and other options for treatment, the patient has consented to  Procedure(s): ?RIGHT/LEFT HEART CATH AND CORONARY ANGIOGRAPHY (N/A) as a surgical intervention.  The patient's history has been reviewed, patient examined, no change in status, stable for surgery.  I have reviewed the patient's chart and labs.  Questions were answered to the patient's satisfaction.   ? ? ? ?Early [place]sm[place] ? ? ?

## 2021-10-09 NOTE — Progress Notes (Signed)
Echocardiogram ?2D Echocardiogram has been performed. ? ?Brooke [name] ?10/09/2021, 9:35 AM ?

## 2021-10-10 ENCOUNTER — Encounter (HOSPITAL_COMMUNITY): Payer: Self-pay | Admitting: Internal Medicine

## 2021-10-10 ENCOUNTER — Other Ambulatory Visit (HOSPITAL_COMMUNITY): Payer: Self-pay

## 2021-10-10 DIAGNOSIS — I5023 Acute on chronic systolic (congestive) heart failure: Secondary | ICD-10-CM

## 2021-10-10 DIAGNOSIS — I10 Essential (primary) hypertension: Secondary | ICD-10-CM

## 2021-10-10 DIAGNOSIS — I2510 Atherosclerotic heart disease of native coronary artery without angina pectoris: Secondary | ICD-10-CM

## 2021-10-10 DIAGNOSIS — E119 Type 2 diabetes mellitus without complications: Secondary | ICD-10-CM

## 2021-10-10 DIAGNOSIS — I255 Ischemic cardiomyopathy: Secondary | ICD-10-CM

## 2021-10-10 DIAGNOSIS — L8990 Pressure ulcer of unspecified site, unspecified stage: Secondary | ICD-10-CM | POA: Insufficient documentation

## 2021-10-10 LAB — LIPID PANEL
Cholesterol: 154 mg/dL (ref 0–200)
HDL: 33 mg/dL — ABNORMAL LOW (ref >40)
LDL Cholesterol: 102 mg/dL — ABNORMAL HIGH (ref 0–99)
Total CHOL/HDL Ratio: 4.7 RATIO
Triglycerides: 97 mg/dL (ref <150)
VLDL: 19 mg/dL (ref 0–40)

## 2021-10-10 LAB — CBC
HCT: 32.4 % — ABNORMAL LOW (ref 36.0–46.0)
Hemoglobin: 10.3 g/dL — ABNORMAL LOW (ref 12.0–15.0)
MCH: 24.8 pg — ABNORMAL LOW (ref 26.0–34.0)
MCHC: 31.8 g/dL (ref 30.0–36.0)
MCV: 78.1 fL — ABNORMAL LOW (ref 80.0–100.0)
Platelets: 221 10*3/uL (ref 150–400)
RBC: 4.15 MIL/uL (ref 3.87–5.11)
RDW: 13.9 % (ref 11.5–15.5)
WBC: 8.5 10*3/uL (ref 4.0–10.5)
nRBC: 0 % (ref 0.0–0.2)

## 2021-10-10 LAB — BASIC METABOLIC PANEL
Anion gap: 7 (ref 5–15)
BUN: 12 mg/dL (ref 6–20)
CO2: 29 mmol/L (ref 22–32)
Calcium: 7.6 mg/dL — ABNORMAL LOW (ref 8.9–10.3)
Chloride: 99 mmol/L (ref 98–111)
Creatinine, Ser: 0.82 mg/dL (ref 0.44–1.00)
GFR, Estimated: >60 mL/min (ref >60)
Glucose, Bld: 321 mg/dL — ABNORMAL HIGH (ref 70–99)
Potassium: 3 mmol/L — ABNORMAL LOW (ref 3.5–5.1)
Sodium: 135 mmol/L (ref 135–145)

## 2021-10-10 LAB — URINALYSIS, COMPLETE (UACMP) WITH MICROSCOPIC
Bacteria, UA: NONE SEEN
Bilirubin Urine: NEGATIVE
Glucose, UA: >=500 mg/dL — AB
Ketones, ur: NEGATIVE mg/dL
Nitrite: NEGATIVE
Protein, ur: 30 mg/dL — AB
Specific Gravity, Urine: 1.026 (ref 1.005–1.030)
pH: 5 (ref 5.0–8.0)

## 2021-10-10 LAB — PREALBUMIN: Prealbumin: 8.4 mg/dL — ABNORMAL LOW (ref 18–38)

## 2021-10-10 LAB — COOXEMETRY PANEL
Carboxyhemoglobin: 1.5 % (ref 0.5–1.5)
Methemoglobin: <0.7 % (ref 0.0–1.5)
O2 Saturation: 75.8 %
Total hemoglobin: 10.6 g/dL — ABNORMAL LOW (ref 12.0–16.0)

## 2021-10-10 LAB — MAGNESIUM: Magnesium: 1.5 mg/dL — ABNORMAL LOW (ref 1.7–2.4)

## 2021-10-10 LAB — HEPARIN LEVEL (UNFRACTIONATED)
Heparin Unfractionated: <0.1 IU/mL — ABNORMAL LOW (ref 0.30–0.70)
Heparin Unfractionated: 0.28 IU/mL — ABNORMAL LOW (ref 0.30–0.70)

## 2021-10-10 LAB — TROPONIN I (HIGH SENSITIVITY)
Troponin I (High Sensitivity): 107 ng/L (ref <18)
Troponin I (High Sensitivity): 93 ng/L — ABNORMAL HIGH (ref <18)

## 2021-10-10 LAB — GLUCOSE, CAPILLARY
Glucose-Capillary: 220 mg/dL — ABNORMAL HIGH (ref 70–99)
Glucose-Capillary: 149 mg/dL — ABNORMAL HIGH (ref 70–99)
Glucose-Capillary: 180 mg/dL — ABNORMAL HIGH (ref 70–99)

## 2021-10-10 MED ORDER — LIVING WELL WITH DIABETES BOOK
Freq: Once | Status: AC
  Administered 2021-10-10: 1
  Filled 2021-10-10: qty 1

## 2021-10-10 MED ORDER — INSULIN STARTER KIT- PEN NEEDLES (ENGLISH)
1.0000 | Freq: Once | Status: AC
  Administered 2021-10-10: 1
  Filled 2021-10-10: qty 1

## 2021-10-10 MED ORDER — LOSARTAN POTASSIUM 25 MG PO TABS
12.5000 mg | ORAL_TABLET | Freq: Every day | ORAL | Status: DC
  Administered 2021-10-10: 12.5 mg via ORAL
  Filled 2021-10-10: qty 1

## 2021-10-10 MED ORDER — FUROSEMIDE 10 MG/ML IJ SOLN
40.0000 mg | Freq: Two times a day (BID) | INTRAMUSCULAR | Status: DC
  Administered 2021-10-10 – 2021-10-11 (×3): 40 mg via INTRAVENOUS
  Filled 2021-10-10 (×3): qty 4

## 2021-10-10 MED ORDER — ALUM & MAG HYDROXIDE-SIMETH 200-200-20 MG/5ML PO SUSP
30.0000 mL | Freq: Once | ORAL | Status: AC
  Administered 2021-10-10: 30 mL via ORAL
  Filled 2021-10-10: qty 30

## 2021-10-10 MED ORDER — INSULIN ASPART 100 UNIT/ML IJ SOLN
0.0000 [IU] | Freq: Three times a day (TID) | INTRAMUSCULAR | Status: DC
  Administered 2021-10-10: 5 [IU] via SUBCUTANEOUS
  Administered 2021-10-10 – 2021-10-11 (×2): 2 [IU] via SUBCUTANEOUS
  Administered 2021-10-11: 8 [IU] via SUBCUTANEOUS
  Administered 2021-10-11: 2 [IU] via SUBCUTANEOUS
  Administered 2021-10-12 – 2021-10-13 (×3): 3 [IU] via SUBCUTANEOUS
  Administered 2021-10-13: 8 [IU] via SUBCUTANEOUS
  Administered 2021-10-13: 3 [IU] via SUBCUTANEOUS
  Administered 2021-10-14: 2 [IU] via SUBCUTANEOUS
  Administered 2021-10-14: 3 [IU] via SUBCUTANEOUS
  Administered 2021-10-15: 2 [IU] via SUBCUTANEOUS
  Administered 2021-10-15: 5 [IU] via SUBCUTANEOUS
  Administered 2021-10-15: 3 [IU] via SUBCUTANEOUS
  Administered 2021-10-16: 13:00:00 11 [IU] via SUBCUTANEOUS
  Administered 2021-10-17: 3 [IU] via SUBCUTANEOUS
  Administered 2021-10-17: 8 [IU] via SUBCUTANEOUS

## 2021-10-10 MED ORDER — INSULIN GLARGINE-YFGN 100 UNIT/ML ~~LOC~~ SOLN
8.0000 [IU] | Freq: Every day | SUBCUTANEOUS | Status: DC
  Administered 2021-10-10 – 2021-10-17 (×8): 8 [IU] via SUBCUTANEOUS
  Filled 2021-10-10 (×8): qty 0.08

## 2021-10-10 MED ORDER — NITROGLYCERIN 0.4 MG SL SUBL
0.4000 mg | SUBLINGUAL_TABLET | Freq: Once | SUBLINGUAL | Status: AC
  Administered 2021-10-10: 0.4 mg via SUBLINGUAL
  Filled 2021-10-10: qty 1

## 2021-10-10 MED ORDER — NITROGLYCERIN IN D5W 200-5 MCG/ML-% IV SOLN
0.0000 ug/min | INTRAVENOUS | Status: DC
  Administered 2021-10-10: 5 ug/min via INTRAVENOUS
  Filled 2021-10-10: qty 250

## 2021-10-10 MED ORDER — FUROSEMIDE 10 MG/ML IJ SOLN: 40.0000 mg | Freq: Once | INTRAMUSCULAR | Status: DC

## 2021-10-10 MED ORDER — POTASSIUM CHLORIDE CRYS ER 20 MEQ PO TBCR
40.0000 meq | EXTENDED_RELEASE_TABLET | Freq: Once | ORAL | Status: AC
  Administered 2021-10-10: 40 meq via ORAL
  Filled 2021-10-10: qty 2

## 2021-10-10 MED ORDER — ALUM & MAG HYDROXIDE-SIMETH 200-200-20 MG/5ML PO SUSP
30.0000 mL | Freq: Four times a day (QID) | ORAL | Status: DC | PRN
  Administered 2021-10-10 – 2021-10-15 (×5): 30 mL via ORAL
  Filled 2021-10-10 (×5): qty 30

## 2021-10-10 MED ORDER — POTASSIUM CHLORIDE CRYS ER 20 MEQ PO TBCR
40.0000 meq | EXTENDED_RELEASE_TABLET | Freq: Two times a day (BID) | ORAL | Status: AC
  Administered 2021-10-10 (×2): 40 meq via ORAL
  Filled 2021-10-10 (×2): qty 2

## 2021-10-10 MED ORDER — MAGNESIUM SULFATE 4 GM/100ML IV SOLN
4.0000 g | Freq: Once | INTRAVENOUS | Status: AC
  Administered 2021-10-10: 4 g via INTRAVENOUS
  Filled 2021-10-10: qty 100

## 2021-10-10 MED ORDER — FUROSEMIDE 10 MG/ML IJ SOLN
INTRAMUSCULAR | Status: AC
  Filled 2021-10-10: qty 4

## 2021-10-10 MED ORDER — SPIRONOLACTONE 12.5 MG HALF TABLET
12.5000 mg | ORAL_TABLET | Freq: Every day | ORAL | Status: DC
  Administered 2021-10-10 – 2021-10-11 (×2): 12.5 mg via ORAL
  Filled 2021-10-10 (×2): qty 1

## 2021-10-10 NOTE — Progress Notes (Addendum)
Advanced Heart Failure Rounding Note  PCP-Cardiologist: Candee Furbish, MD  AHF: Dr. Haroldine Laws   Subjective:    Remains on Milrinone 0.5. Co-ox 78%   IV Lasix held post cath given low filling pressures, mPCW 10, mRAP 5.   Fluid building up, CVP 9-10 today. dPAP ~20. Wt trending up.   Denies dyspnea but complaining of 6/10 chest pain that just started ~30 min ago. SSCP radiating to back. EKG NSR 91 bpm, lateral ST depressions   F/u HS trop pending. On IV heparin. RN just gave SL NTG  SCr 0.82. K 3.0   Swan #s (on Milrinone 0.5) CVP 9-10 PAP 37/20 (26) Thermo CO 4.24 Thermo CI 2.81 FICK CO 5.38  FICK CI 3.51 Co-ox 76% PAPi 1.9    Hgb A1c 11.5  Lipid panel: LDL 102. TG 97, HLD 33    Objective:   Weight Range: 51.5 kg Body mass index is 19.19 kg/m.   Vital Signs:   Temp:  [98.4 F (36.9 C)-99.9 F (37.7 C)] 98.6 F (37 C) (03/03 0700) Pulse Rate:  [82-97] 82 (03/03 0700) Resp:  [15-29] 20 (03/03 0700) BP: (80-132)/(55-81) 126/66 (03/03 0700) SpO2:  [89 %-100 %] 98 % (03/03 0700) FiO2 (%):  [40 %] 40 % (03/02 0900) Weight:  [51.5 kg] 51.5 kg (03/03 0600) Last BM Date : 10/05/21  Weight change: Filed Weights   10/08/21 1807 10/10/21 0600  Weight: 48 kg 51.5 kg    Intake/Output:   Intake/Output Summary (Last 24 hours) at 10/10/2021 0750 Last data filed at 10/10/2021 0600 Gross per 24 hour  Intake 478.17 ml  Output 2100 ml  Net -1621.83 ml      Physical Exam    CVP 9  General:  thin female. No resp difficulty HEENT: Normal + Rt IJ swan  Neck: Supple. JVP 9 cm . Carotids 2+ bilat; no bruits. No lymphadenopathy or thyromegaly appreciated. Cor: PMI nondisplaced. Regular rate & rhythm. No rubs, gallops or murmurs. Lungs: decreased BS at bases bilaterally, faint bibasilar crackles  Abdomen: Soft, nontender, nondistended. No hepatosplenomegaly. No bruits or masses. Good bowel sounds. Extremities: No cyanosis, clubbing, rash, 1+ b/l LE edema Neuro:  Alert & orientedx3, cranial nerves grossly intact. moves all 4 extremities w/o difficulty. Affect pleasant   Telemetry   NSR 90s, personally reviewed   EKG    NSR 91 bpm, lateral ST depressions   Labs    CBC Recent Labs    10/08/21 1947 10/08/21 2305 10/09/21 1253 10/09/21 1259 10/10/21 0358  WBC 13.3* 12.5*  --   --  8.5  NEUTROABS 11.4*  --   --   --   --   HGB 14.6 13.5   < > 12.6 10.3*  HCT 45.2 42.4   < > 37.0 32.4*  MCV 76.7* 78.1*  --   --  78.1*  PLT 316 311  --   --  221   < > = values in this interval not displayed.   Basic Metabolic Panel Recent Labs    10/08/21 2305 10/09/21 1253 10/09/21 1259 10/10/21 0358  NA 135   < > 139 135  K 3.4*   < > 3.4* 3.0*  CL 96*  --   --  99  CO2 24  --   --  29  GLUCOSE 330*  --   --  321*  BUN 8  --   --  12  CREATININE 0.79  --   --  0.82  CALCIUM 8.5*  --   --  7.6*  MG 1.6*  --   --   --    < > = values in this interval not displayed.   Liver Function Tests Recent Labs    10/08/21 1947  AST 22  ALT 13  ALKPHOS 120  BILITOT 1.0  PROT 8.1  ALBUMIN 3.7   No results for input(s): LIPASE, AMYLASE in the last 72 hours. Cardiac Enzymes No results for input(s): CKTOTAL, CKMB, CKMBINDEX, TROPONINI in the last 72 hours.  BNP: BNP (last 3 results) Recent Labs    10/08/21 1947  BNP 1,373.8*    ProBNP (last 3 results) No results for input(s): PROBNP in the last 8760 hours.   D-Dimer No results for input(s): DDIMER in the last 72 hours. Hemoglobin A1C Recent Labs    10/09/21 1633  HGBA1C 11.5*   Fasting Lipid Panel Recent Labs    10/10/21 0358  CHOL 154  HDL 33*  LDLCALC 102*  TRIG 97  CHOLHDL 4.7   Thyroid Function Tests Recent Labs    10/09/21 0056  TSH 2.060    Other results:   Imaging    CARDIAC CATHETERIZATION  Result Date: 10/09/2021   Prox RCA lesion is 100% stenosed.   Ost LM lesion is 60% stenosed.   Mid LAD lesion is 80% stenosed.   2nd Diag lesion is 85% stenosed.    1st Diag lesion is 99% stenosed.   Prox LAD lesion is 80% stenosed.   Mid Cx lesion is 70% stenosed.   The left ventricular ejection fraction is 25-35% by visual estimate. 1.  Severe multivessel disease. 2.  Severe LV dysfunction 3.  Fick cardiac output of 5.2 L/min and Fick cardiac index of 3.4 L a minute per meter squared with a PA saturation of 68%. 4.  Thermodilution outputs done in triplicate demonstrated a cardiac output of 3 L/min and an index of 2 L/min/m; milrinone was increased to 2.5 mcg per kilogram per minute. 5.  Cardiac hemodynamics as follows: A.  Right atrial pressure 9/7 with a mean of 5 mmHg B.  RV 38/3 with an end-diastolic pressure of 18 mmHg C.  Wedge pressure of 12/11 with a mean of 10 mmHg D.  Pulmonary artery pressure of 23/12 with a mean pressure of 17 mmHg; PAPI =9/5=1.8 E.  LVEDP of 2mmHg. The results were reviewed with Dr. Haroldine Laws.  The patient be transferred to the to heart unit for medical optimization.  No mechanical circulatory support was pursued.   ECHOCARDIOGRAM COMPLETE  Result Date: 10/09/2021    ECHOCARDIOGRAM REPORT   Patient Name:   Brooke [name] Date of Exam: 10/09/2021 Medical Rec #:  [id number]      Height:       64.5 in Accession #:    [id number]     Weight:       105.8 lb Date of Birth:  05/14/1961      BSA:          1.501 m Patient Age:    61 years       BP:           123/87 mmHg Patient Gender: F              HR:           80 bpm. Exam Location:  Inpatient Procedure: 2D Echo Indications:    Acute systolic CHF  History:        Patient has no prior history of Echocardiogram examinations.  Signs/Symptoms:Chest Pain; Risk Factors:Diabetes and                 Hypertension.  Sonographer:    Arlyss Gandy Referring Phys: [id number] [place] T NIPP  Sonographer Comments: Supine. IMPRESSIONS  1. Left ventricular ejection fraction, by estimation, is 20 to 25%. The left ventricle has severely decreased function. The left ventricle demonstrates global hypokinesis  with regional variation, particularly worse at the anterior apex and apex. Left ventricular diastolic parameters are consistent with Grade II diastolic dysfunction (pseudonormalization). Elevated left ventricular end-diastolic pressure. The E/e' is 21.  2. Right ventricular systolic function is moderately reduced. The right ventricular size is normal. Tricuspid regurgitation signal is inadequate for assessing PA pressure.  3. Left atrial size was mildly dilated.  4. The mitral valve is abnormal. Trivial mitral valve regurgitation.  5. The aortic valve is tricuspid. Aortic valve regurgitation is not visualized. Aortic valve sclerosis is present, with no evidence of aortic valve stenosis.  6. The inferior vena cava is dilated in size with <50% respiratory variability, suggesting right atrial pressure of 15 mmHg. Comparison(s): No prior Echocardiogram. FINDINGS  Left Ventricle: Left ventricular ejection fraction, by estimation, is 20 to 25%. The left ventricle has severely decreased function. The left ventricle demonstrates global hypokinesis. The left ventricular internal cavity size was normal in size. There is no left ventricular hypertrophy. Left ventricular diastolic parameters are consistent with Grade II diastolic dysfunction (pseudonormalization). Elevated left ventricular end-diastolic pressure. The E/e' is 21. Right Ventricle: The right ventricular size is normal. No increase in right ventricular wall thickness. Right ventricular systolic function is moderately reduced. Tricuspid regurgitation signal is inadequate for assessing PA pressure. Left Atrium: Left atrial size was mildly dilated. Right Atrium: Right atrial size was normal in size. Pericardium: There is no evidence of pericardial effusion. Mitral Valve: The mitral valve is abnormal. There is mild thickening of the anterior and posterior mitral valve leaflet(s). There is mild calcification of the mitral valve leaflet(s). Trivial mitral valve  regurgitation. Tricuspid Valve: The tricuspid valve is grossly normal. Tricuspid valve regurgitation is not demonstrated. Aortic Valve: The aortic valve is tricuspid. Aortic valve regurgitation is not visualized. Aortic valve sclerosis is present, with no evidence of aortic valve stenosis. Aortic valve mean gradient measures 2.0 mmHg. Aortic valve peak gradient measures 3.1  mmHg. Aortic valve area, by VTI measures 1.65 cm. Pulmonic Valve: The pulmonic valve was normal in structure. Pulmonic valve regurgitation is not visualized. Aorta: The aortic root and ascending aorta are structurally normal, with no evidence of dilitation. Venous: The inferior vena cava is dilated in size with less than 50% respiratory variability, suggesting right atrial pressure of 15 mmHg. IAS/Shunts: No atrial level shunt detected by color flow Doppler. Additional Comments: There is pleural effusion in both left and right lateral regions.  LEFT VENTRICLE PLAX 2D LVIDd:         4.50 cm   Diastology LVIDs:         4.10 cm   LV e' medial:    4.19 cm/s LV PW:         0.80 cm   LV E/e' medial:  23.2 LV IVS:        0.80 cm   LV e' lateral:   5.22 cm/s LVOT diam:     2.00 cm   LV E/e' lateral: 18.6 LV SV:         25 LV SV Index:   17 LVOT Area:     3.14 cm  RIGHT VENTRICLE            IVC RV Basal diam:  3.30 cm    IVC diam: 2.30 cm RV Mid diam:    3.30 cm RV S prime:     6.60 cm/s TAPSE (M-mode): 1.6 cm LEFT ATRIUM             Index        RIGHT ATRIUM           Index LA diam:        3.00 cm 2.00 cm/m   RA Area:     12.90 cm LA Vol (A2C):   21.7 ml 14.46 ml/m  RA Volume:   30.90 ml  20.59 ml/m LA Vol (A4C):   51.2 ml 34.12 ml/m LA Biplane Vol: 36.7 ml 24.46 ml/m  AORTIC VALVE AV Area (Vmax):    1.79 cm AV Area (Vmean):   1.68 cm AV Area (VTI):     1.65 cm AV Vmax:           88.00 cm/s AV Vmean:          59.200 cm/s AV VTI:            0.153 m AV Peak Grad:      3.1 mmHg AV Mean Grad:      2.0 mmHg LVOT Vmax:         50.20 cm/s LVOT  Vmean:        31.600 cm/s LVOT VTI:          0.080 m LVOT/AV VTI ratio: 0.53  AORTA Ao Root diam: 3.10 cm Ao Asc diam:  3.10 cm MITRAL VALVE MV Area (PHT): 5.62 cm    SHUNTS MV Decel Time: 135 msec    Systemic VTI:  0.08 m MV E velocity: 97.10 cm/s  Systemic Diam: 2.00 cm MV A velocity: 74.80 cm/s MV E/A ratio:  1.30 Lyman Bishop MD Electronically signed by Lyman Bishop MD Signature Date/Time: 10/09/2021/11:30:25 AM    Final      Medications:     Scheduled Medications:  aspirin  81 mg Oral Daily   atorvastatin  80 mg Oral Daily   Chlorhexidine Gluconate Cloth  6 each Topical Daily   losartan  25 mg Oral Daily   mupirocin ointment  1 application Nasal BID   pantoprazole  40 mg Oral Daily   potassium chloride  40 mEq Oral BID   sodium chloride flush  3 mL Intravenous Q12H   sodium chloride flush  3 mL Intravenous Q12H   sodium chloride flush  3 mL Intravenous Q12H    Infusions:  sodium chloride 10 mL/hr at 10/10/21 0600   sodium chloride     sodium chloride 10 mL/hr at 10/10/21 0600   heparin 800 Units/hr (10/10/21 0600)   milrinone 0.5 mcg/kg/min (10/10/21 0600)    PRN Medications: sodium chloride, sodium chloride, sodium chloride, acetaminophen, ALPRAZolam, diazepam, ondansetron (ZOFRAN) IV, sodium chloride flush, sodium chloride flush, sodium chloride flush    Patient Profile   61 y/o female w/ reported h/o DM but not on medications, chronic pelvic floor dysfunction and protein calorie malnutrition (Body mass index is 17.88 kg/m.) but no prior cardiac history. No routien preventative care. Has not seen a PCP nor been on medications for > 2 years due to issues w/ her insurance, admitted w/ new systolic heart failure and cardiogenic shock.    Echo EF 20-25%, RV moderately reduced  Assessment/Plan   1. Acute Biventricular Heart Failure>>Low  Output  - New. Echo EF 20-25%, RV moderately reduced. Acidotic on admit, LA 4.4. Started on empiric milrinone + IV Lasix - ICM. LHC w/  severe diffuse MVCAD - RHC on Milrinone 0.25 w/ low filling pressures and low CO/CI by thermo, CO 3L/min, CI 2L/m. PAPI 1.8. Milrinone increased to 0.5.  - Remains on Milrinone 0.5. Co-ox 78%. CI by Thermo 2.81, FICK CI 3.51 - Diuretics held post cath w/ low filling pressures (mPCW 10, mRAP 5) - Volume trending up off diuretics. CVP 9-10, dPAP ~20. Wt trending up - Restart IV Lasix 40 mg bid  - Add Spiro 12.5 mg daily (K 3.0)  - Continue milrinone. Wean as tolerated. Follow swan hemodynamics  - Was started on an ACE-I on admit (Captopril). Will switch to Losartan 12.5 mg qhs to help w/ eventual transition to Entresto    2. CAD - diffuse MV CAD -  Prox RCA lesion is 100% stenosed. (RPAV filled by collaterals from Dist Cx)  - Ost LM lesion is 60% stenosed.  - Mid LAD lesion is 80% stenosed.  - 2nd Diag lesion is 85% stenosed.  - 1st Diag lesion is 99% stenosed.  - Prox LAD lesion is 80% stenosed.  - Mid Cx lesion is 70% stenosed. - CT surgery to evaluate for possible CABG, though suspect she may be a poor candidate - Will review PCI options w/ IC team - Continue to optimize HF, prior to potential intervention. - Currently w/ 6/10 CP. EKG w/ lateral ST depressions  - Continue IV heparin + SL NTG. BP may be too soft for NTG gtt  - May need IABP placement to help w/ coronary perfusion  - Continue ASA 81 mg daily  - Continue Atorva 80 mg  - no ? blocker w/ low output HF      3. Type 2 Diabetes  - not on meds PTA, not followed by PCP   - Hgb A1c 11.5 - SSI - Consult DM coordinator - Will need to establish care w/ PCP post d/c    4. Hypokalemia - K 3.0 - supp w/ KCL - add spiro 12.5 mg - check Mg, supp if needed  - follow BMP    5. ID - CXR + b/l atelectasis vs infiltrates, but doubt PNA (no cough)   - WBC 13>>8.5K, AF  - PCT 0.21>>0.81 - UA + mod leukocytes. Nitrate negative. UCx pending   6. HLD, LDL goal < 70  - LDL 102 - Has been started on Atorva 80 - Needs f/u FLP  and HFTs in 6-8 wks    Length of Stay: 1  Brittainy Simmons, PA-C  10/10/2021, 7:50 AM  Advanced Heart Failure Team Pager [phone] (M-F; 7a - 5p)  Please contact [facility] for night-coverage after hours (5p -7a ) and weekends on [email or web address]   Patient seen and examined with the above-signed Advanced Practice Provider and/or Housestaff. I personally reviewed laboratory data, imaging studies and relevant notes. I independently examined the patient and formulated the important aspects of the plan. I have edited the note to reflect any of my changes or salient points. I have personally discussed the plan with the patient and/or family.  Remains tenuous. Having recurrent CP today. ECG with minimal lateral changes. Co-ox ok on milrinone. Volume up.   HGba1c 11.5 Prealbumin 8.4. Body mass index is 19.19 kg/m.  General:  Thin women lying in bed No resp difficulty HEENT: normal Neck: supple. RIJ swan  Carotids 2+ bilat; no bruits. No  lymphadenopathy or thryomegaly appreciated. Cor: PMI nondisplaced. Regular rate & rhythm. No rubs, gallops or murmurs. Lungs: crackles at the bases Abdomen: soft, nontender, nondistended. No hepatosplenomegaly. No bruits or masses. Good bowel sounds. Extremities: no cyanosis, clubbing, rash, edema Neuro: alert & orientedx3, cranial nerves grossly intact. moves all 4 extremities w/o difficulty. Affect pleasant  She has severe 3v CAD with ischemic CM. Output improved on milrinone. Now having recurrent angina.   Given uncontrolled DM2, cachexia, poor nutritional status and heavily calcified arteries I am not sure that she is adequate surgical candidate. Have d/w TCTS who will evaluate.  Agree with heparin and IV lasix. Start low-dose NTG (can wean milrinone if needed). If pain persists may need IABP.   If not surgical candidate will need to review options with interventional team for supported high-risk PCI/atherectomy.   CRITICAL CARE Performed by:  Glori Bickers  Total critical care time: 45 minutes  Critical care time was exclusive of separately billable procedures and treating other patients.  Critical care was necessary to treat or prevent imminent or life-threatening deterioration.  Critical care was time spent personally by me (independent of midlevel providers or residents) on the following activities: development of treatment plan with patient and/or surrogate as well as nursing, discussions with consultants, evaluation of patient's response to treatment, examination of patient, obtaining history from patient or surrogate, ordering and performing treatments and interventions, ordering and review of laboratory studies, ordering and review of radiographic studies, pulse oximetry and re-evaluation of patient's condition.  Glori Bickers, MD  10:51 AM

## 2021-10-10 NOTE — Progress Notes (Signed)
Patient refuse NIV for the night, states does not wear mask at home.  ?

## 2021-10-10 NOTE — Progress Notes (Signed)
ANTICOAGULATION CONSULT NOTE ? ?Pharmacy Consult for Heparin ?Indication: chest pain/ACS ? ?Allergies  ?Allergen Reactions  ? Aspirin   ?  Intolerance (aspirin sensitive stomach)  ? Darvocet [Propoxyphene N-Acetaminophen]   ? Erythromycin   ? Gabapentin Other (See Comments)  ?  Suicidal ideations   ? Ibuprofen   ? Metformin And Related Nausea Only  ? ? ?Patient Measurements: ?Height: 5' 4.5" (163.8 cm) ?Weight: 51.5 kg (113 lb 8.6 oz) ?IBW/kg (Calculated) : 55.85 ?Heparin Dosing Weight: 48 kg  ? ?Vital Signs: ?Temp: 99 ?F (37.2 ?C) (03/03 1100) ?Temp Source: Core (03/03 0800) ?BP: 110/67 (03/03 1100) ?Pulse Rate: 87 (03/03 1100) ? ?Labs: ?Recent Labs  ?  10/08/21 ?1947 10/08/21 ?2150 10/08/21 ?2305 10/09/21 ?8115 10/09/21 ?1253 10/09/21 ?1256 10/09/21 ?1259 10/10/21 ?7262 10/10/21 ?0750 10/10/21 ?1335  ?HGB 14.6  --  13.5  --    < > 12.2 12.6 10.3*  --   --   ?HCT 45.2  --  42.4  --    < > 36.0 37.0 32.4*  --   --   ?PLT 316  --  311  --   --   --   --  221  --   --   ?LABPROT  --   --  16.6*  --   --   --   --   --   --   --   ?INR  --   --  1.3*  --   --   --   --   --   --   --   ?HEPARINUNFRC  --   --   --   --   --   --   --  <0.10*  --  0.28*  ?CREATININE 0.70  --  0.79  --   --   --   --  0.82  --   --   ?TROPONINIHS 35*   < > 58* 135*  --   --   --   --  107*  --   ? < > = values in this interval not displayed.  ? ? ? ?Estimated Creatinine Clearance: 60.1 mL/min (by C-G formula based on SCr of 0.82 mg/dL). ? ? ?Medical History: ?Past Medical History:  ?Diagnosis Date  ? Abdominal pain, left lower quadrant 12/17/2015  ? Chest pain, unspecified 10/05/2003  ? Depressive disorder, not elsewhere classified 07/31/2005  ? Diabetes mellitus without complication ([place]) 03/55/9741  ? Endometriosis 09/14/2012  ? Hypertension 11/23/2003  ? Unspecified constipation 06/24/2004  ? ?Assessment: ?61 yo female admitted 10/08/2021. Patient s/p cath today with severe multivessel disease and severe LV dysfunction with estimated EF  25-35%. Pharmacy consulted to resume heparin 8 hrs after sheath removal. ? ?Repeat heparin level this afternoon is slightly subtherapeutic at 0.28. No bleeding or infusion issues noted by nursing staff. ? ?Goal of Therapy:  ?Heparin level 0.3-0.7 units/ml ?Monitor platelets by anticoagulation protocol: Yes ?  ?Plan:  ?Increase heparin to 900 units/h ?Recheck heparin level and CBC with am labs ? ? ?Arrie Senate, PharmD, BCPS, BCCP ?Clinical Pharmacist ?[phone] ?Please check AMION for all [place] numbers ?10/10/2021 ? ? ?

## 2021-10-10 NOTE — Progress Notes (Signed)
ANTICOAGULATION CONSULT NOTE ? ?Pharmacy Consult for Heparin ?Indication: chest pain/ACS ? ?Allergies  ?Allergen Reactions  ? Aspirin   ?  Intolerance (aspirin sensitive stomach)  ? Darvocet [Propoxyphene N-Acetaminophen]   ? Erythromycin   ? Gabapentin Other (See Comments)  ?  Suicidal ideations   ? Ibuprofen   ? Metformin And Related Nausea Only  ? ? ?Patient Measurements: ?Height: 5' 4.5" (163.8 cm) ?Weight: 48 kg (105 lb 13.1 oz) ?IBW/kg (Calculated) : 55.85 ?Heparin Dosing Weight: 48 kg  ? ?Vital Signs: ?Temp: 99.5 ?F (37.5 ?C) (03/03 0400) ?BP: 104/63 (03/03 0400) ?Pulse Rate: 90 (03/03 0400) ? ?Labs: ?Recent Labs  ?  10/08/21 ?1947 10/08/21 ?2150 10/08/21 ?2305 10/09/21 ?2947 10/09/21 ?1253 10/09/21 ?1256 10/09/21 ?1259 10/10/21 ?0358  ?HGB 14.6  --  13.5  --    < > 12.2 12.6 10.3*  ?HCT 45.2  --  42.4  --    < > 36.0 37.0 32.4*  ?PLT 316  --  311  --   --   --   --  221  ?LABPROT  --   --  16.6*  --   --   --   --   --   ?INR  --   --  1.3*  --   --   --   --   --   ?HEPARINUNFRC  --   --   --   --   --   --   --  <0.10*  ?CREATININE 0.70  --  0.79  --   --   --   --   --   ?TROPONINIHS 35* 44* 58* 135*  --   --   --   --   ? < > = values in this interval not displayed.  ? ? ? ?Estimated Creatinine Clearance: 57.4 mL/min (by C-G formula based on SCr of 0.79 mg/dL). ? ? ?Medical History: ?Past Medical History:  ?Diagnosis Date  ? Abdominal pain, left lower quadrant 12/17/2015  ? Chest pain, unspecified 10/05/2003  ? Depressive disorder, not elsewhere classified 07/31/2005  ? Diabetes mellitus without complication ([place]) 65/46/5035  ? Endometriosis 09/14/2012  ? Hypertension 11/23/2003  ? Unspecified constipation 06/24/2004  ? ?Assessment: ?61 yo female admitted 10/08/2021. 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 Pharmacy consulted to resume heparin 8 hrs after sheath removal. ? ?Initial heparin level is undetectable on 600 units/hr. Hgb trending  down from 13 on admission to 10.3 this morning. Platelet count also down from 300s to 221 now post cath. No bleeding or IV issues noted per nursing.  ? ?Goal of Therapy:  ?Heparin level 0.3-0.7 units/ml ?Monitor platelets by anticoagulation protocol: Yes ?  ?Plan:  ?Increase heparin to 800 units/hr ?Recheck heparin level in 6 hours ?Follow cbc closely and watch for signs and symptoms of bleeding ? ?Erin Hearing PharmD., BCPS ?Clinical Pharmacist ?10/10/2021 4:58 AM ? ?

## 2021-10-10 NOTE — Progress Notes (Signed)
Spoke with Primary RN re: PICC order, to hold off on placing PICC for now. RN will discontinue PICC order and will place back when needed. Patient has 2 working PIVs and Programmer, systems.  ?

## 2021-10-10 NOTE — Progress Notes (Signed)
Pt awake and alert on 1LNC tolerating well. Bipap not at bedside. Pt states she is breathing normally and does not believe she needs it. Will continue to monitor.  ?

## 2021-10-10 NOTE — TOC CM/SW Note (Signed)
HF TOC CM arranged follow up appt for 11/05/2021 at 3:10 pm at [facility] and [facility]. Will assist with medication using MATCH/HF fund. Will request meds be sent from [place] at dc. Will continue to follow for dc needs. Jonnie Finner RN3 CCM, Heart Failure TOC CM [phone]  ?

## 2021-10-10 NOTE — TOC Initial Note (Signed)
Transition of Care (TOC) - Initial/Assessment Note  ? ? ?Patient Details  ?Name: Brooke [name] ?MRN: [id number] ?Date of Birth: 1961-05-17 ? ?Transition of Care [facility]) CM/SW Contact:    ?Marcheta Grammes Rexene Alberts, RN ?Phone Number: [phone] ?10/10/2021, 3:59 PM ? ?Clinical Narrative:                 ?HF TOC CM spoke to pt at bedside. States she was independent prior to hospital stay. Explained TOC CM will following for medication assistance from [facility] or HF funds, and follow up PCP appt. Appt arranged at [place] on 11/05/21. Appt will print on dc instructions at time of dc. Will continue to follow for dc needs.  ? ?Expected Discharge Plan: Home/Self Care ?Barriers to Discharge: Continued Medical Work up ? ? ?Patient Goals and CMS Choice ?Patient states their goals for this hospitalization and ongoing recovery are:: wants to get better ?CMS Medicare.gov Compare Post Acute Care list provided to:: Patient ?  ? ?Expected Discharge Plan and Services ?Expected Discharge Plan: Home/Self Care ?In-house Referral: Clinical Social Work ?Discharge Planning Services: CM Consult ?  ?Living arrangements for the past 2 months: [place] ?                ?  ?  ?  ?  ?  ?  ?  ?  ?  ?  ? ?Prior Living Arrangements/Services ?Living arrangements for the past 2 months: [place] ?Lives with:: Spouse, Minor Children ?Patient language and need for interpreter reviewed:: Yes ?Do you feel safe going back to the place where you live?: Yes      ?Need for Family Participation in Patient Care: No (Comment) ?Care giver support system in place?: No (comment) ?  ?Criminal Activity/Legal Involvement Pertinent to Current Situation/Hospitalization: No - Comment as needed ? ?Activities of Daily Living ?Home Assistive Devices/Equipment: None ?ADL Screening (condition at time of admission) ?Patient's cognitive ability adequate to safely complete daily activities?: Yes ?Is the patient deaf or have difficulty hearing?:  No ?Does the patient have difficulty seeing, even when wearing glasses/contacts?: No ?Does the patient have difficulty concentrating, remembering, or making decisions?: No ?Patient able to express need for assistance with ADLs?: Yes ?Does the patient have difficulty dressing or bathing?: No ?Independently performs ADLs?: Yes (appropriate for developmental age) ?Does the patient have difficulty walking or climbing stairs?: Yes (hard x 2 weeks) ?Weakness of Legs: Both ?Weakness of Arms/Hands: Both ? ?Permission Sought/Granted ?Permission sought to share information with : Case Manager, Family Supports, PCP ?Permission granted to share information with : Yes, Verbal Permission Granted ? Share Information with NAME: Elnita Maxwell ?   ? Permission granted to share info w Relationship: husband ? Permission granted to share info w Contact Information: [phone] ? ?Emotional Assessment ?Appearance:: Appears stated age ?Attitude/Demeanor/Rapport: Engaged, Gracious ?Affect (typically observed): Accepting ?Orientation: : Oriented to Self, Oriented to Place, Oriented to  Time, Oriented to Situation ?  ?Psych Involvement: No (comment) ? ?Admission diagnosis:  Cardiogenic shock ([place]) [R57.0] ?Lactic acidosis [E87.20] ?Acute respiratory failure with hypoxia ([place]) [J96.01] ?Acute congestive heart failure, unspecified heart failure type ([place]) [I50.9] ?Patient Active Problem List  ? Diagnosis Date Noted  ? Cardiogenic shock ([place]) 10/09/2021  ? Chronic pelvic pain in female 05/06/2020  ? Anxiety 07/01/2016  ? Wrist pain 06/24/2016  ? Back pain 06/03/2016  ? Abdominal pain, left lower quadrant 12/17/2015  ? History of colonic polyps 08/07/2015  ? Diverticulitis 05/28/2015  ? Seborrheic  keratoses 01/30/2015  ? Hyperlipidemia 01/30/2015  ? Generalized anxiety disorder 07/04/2014  ? Diabetes mellitus without complication ([place]) 64/35/3912  ? Depression, major, recurrent, moderate ([place]) 07/31/2005  ? Hypertension 11/23/2003  ?  Endometriosis 03/28/1996  ? ?PCP:  Patient, No Pcp Per (Inactive) ?Pharmacy:   ?[id number] - [place], [place] [address] ?[place] [address] ?Phone: [phone] Fax: [phone] ? ?[place] [place] [address] ?Phone: [phone] Fax: [phone] ? ? ? ? ?Social Determinants of Health (SDOH) Interventions ?  ? ?Readmission Risk Interventions ?No flowsheet data found. ? ? ?

## 2021-10-10 NOTE — TOC Progression Note (Addendum)
Transition of Care (TOC) - Progression Note  ? ? ?Patient Details  ?Name: Brooke [name] ?MRN: [id number] ?Date of Birth: 1961-01-09 ? ?Transition of Care (TOC) CM/SW Contact  ?Maureena Dabbs, LCSW ?Phone Number: ?10/10/2021, 10:14 AM ? ?Clinical Narrative:    ?HF CSW reached out to [employer] to see about a Medicaid screen as Ms. Elmore account states self-pay/no health insurance. [facility] reported that her case will be referred to [facility]. ? ?CSW will continue to follow throughout discharge. ? ? ?  ?Barriers to Discharge: Continued Medical Work up ? ?Expected Discharge Plan and Services ?  ?In-house Referral: Clinical Social Work ?  ?  ?  ?                ?  ?  ?  ?  ?  ?  ?  ?  ?  ?  ? ? ?Social Determinants of Health (SDOH) Interventions ?  ? ?Readmission Risk Interventions ?No flowsheet data found. ? ?[place], MSW, LCSW ?[phone] ?Heart Failure Social Worker  ?

## 2021-10-10 NOTE — Consult Note (Signed)
Reason for Consult:CAD with ischemic cardiomyopathy Referring Physician: Dr. Roseanne Kaufman [name] is an 61 y.o. female.  HPI: Brooke [name] is a 61 yo woman with a long standing history of diabetes, hypertension, endometriosis and depression.  She presented with a cc/o 6 week history of shortness of breath and more recently substernal chest pain.  Also complained of orthopnea and peripheral edema.  Workup revealed elevated BNP, mildly elevated troponin and lactic acidosis.  CT ruled out PE.  Diuresed and placed on BIPAP. Echo showed EF 20% with anterior and apical akinesis. Cardiac cath revealed severe 3 vessel CAD with diffusely diseased targets.  Shortness of breath has improved since admission, but still intermittently having substernal chest pressure.  Past Medical History:  Diagnosis Date   Abdominal pain, left lower quadrant 12/17/2015   Chest pain, unspecified 10/05/2003   Depressive disorder, not elsewhere classified 07/31/2005   Diabetes mellitus without complication ([place]) 91/79/1505   Endometriosis 09/14/2012   Hypertension 11/23/2003   Unspecified constipation 06/24/2004    Past Surgical History:  Procedure Laterality Date   APPENDECTOMY  1996   Dr. Lindon Romp   BREAST SURGERY Bilateral 1996   Dr. Haskell Riling HERNIA REPAIR     OVARIAN CYST REMOVAL     RIGHT/LEFT HEART CATH AND CORONARY ANGIOGRAPHY N/A 10/09/2021   Procedure: RIGHT/LEFT HEART CATH AND CORONARY ANGIOGRAPHY;  Surgeon: Early Osmond, MD;  Location: [facility];  Service: Cardiovascular;  Laterality: N/A;    Family History  Problem Relation Age of Onset   Diabetes Mother    Hypertension Mother    Arthritis Mother        RA   Pancreatic cancer Father    Stomach cancer Neg Hx    Colon cancer Neg Hx    Esophageal cancer Neg Hx    Rectal cancer Neg Hx     Social History:  reports that she has never smoked. She has never used smokeless tobacco. She reports that she does not drink alcohol  and does not use drugs.  Allergies:  Allergies  Allergen Reactions   Aspirin     Intolerance (aspirin sensitive stomach)   Darvocet [Propoxyphene N-Acetaminophen]    Erythromycin    Gabapentin Other (See Comments)    Suicidal ideations    Ibuprofen    Metformin And Related Nausea Only    Medications: Scheduled:  alum & mag hydroxide-simeth  30 mL Oral Once   aspirin  81 mg Oral Daily   atorvastatin  80 mg Oral Daily   Chlorhexidine Gluconate Cloth  6 each Topical Daily   furosemide  40 mg Intravenous BID   insulin aspart  0-15 Units Subcutaneous TID WC   insulin glargine-yfgn  8 Units Subcutaneous Daily   losartan  12.5 mg Oral QHS   pantoprazole  40 mg Oral Daily   potassium chloride  40 mEq Oral BID   sodium chloride flush  3 mL Intravenous Q12H   sodium chloride flush  3 mL Intravenous Q12H   sodium chloride flush  3 mL Intravenous Q12H   spironolactone  12.5 mg Oral Daily    Results for orders placed or performed during the hospital encounter of 10/08/21 (from the past 48 hour(s))  CBC with Differential     Status: Abnormal   Collection Time: 10/08/21  7:47 PM  Result Value Ref Range   WBC 13.3 (H) 4.0 - 10.5 K/uL   RBC 5.89 (H) 3.87 - 5.11 MIL/uL   Hemoglobin 14.6 12.0 -  15.0 g/dL   HCT 45.2 36.0 - 46.0 %   MCV 76.7 (L) 80.0 - 100.0 fL   MCH 24.8 (L) 26.0 - 34.0 pg   MCHC 32.3 30.0 - 36.0 g/dL   RDW 13.8 11.5 - 15.5 %   Platelets 316 150 - 400 K/uL   nRBC 0.0 0.0 - 0.2 %   Neutrophils Relative % 86 %   Neutro Abs 11.4 (H) 1.7 - 7.7 K/uL   Lymphocytes Relative 8 %   Lymphs Abs 1.1 0.7 - 4.0 K/uL   Monocytes Relative 5 %   Monocytes Absolute 0.7 0.1 - 1.0 K/uL   Eosinophils Relative 0 %   Eosinophils Absolute 0.0 0.0 - 0.5 K/uL   Basophils Relative 0 %   Basophils Absolute 0.0 0.0 - 0.1 K/uL   Immature Granulocytes 1 %   Abs Immature Granulocytes 0.07 0.00 - 0.07 K/uL    Comment: Performed at [facility], [place] [address],  [place], [place] [address]  Comprehensive metabolic panel     Status: Abnormal   Collection Time: 10/08/21  7:47 PM  Result Value Ref Range   Sodium 134 (L) 135 - 145 mmol/L   Potassium 3.5 3.5 - 5.1 mmol/L   Chloride 97 (L) 98 - 111 mmol/L   CO2 23 22 - 32 mmol/L   Glucose, Bld 341 (H) 70 - 99 mg/dL    Comment: Glucose reference range applies only to samples taken after fasting for at least 8 hours.   BUN 8 6 - 20 mg/dL   Creatinine, Ser 0.70 0.44 - 1.00 mg/dL   Calcium 9.1 8.9 - 10.3 mg/dL   Total Protein 8.1 6.5 - 8.1 g/dL   Albumin 3.7 3.5 - 5.0 g/dL   AST 22 15 - 41 U/L   ALT 13 0 - 44 U/L   Alkaline Phosphatase 120 38 - 126 U/L   Total Bilirubin 1.0 0.3 - 1.2 mg/dL   GFR, Estimated >60 >60 mL/min    Comment: (NOTE) Calculated using the CKD-EPI Creatinine Equation (2021)    Anion gap 14 5 - 15    Comment: Performed at [facility], [place] [address], [place], [place] [address]  Troponin I (High Sensitivity)     Status: Abnormal   Collection Time: 10/08/21  7:47 PM  Result Value Ref Range   Troponin I (High Sensitivity) 35 (H) <18 ng/L    Comment: (NOTE) Elevated high sensitivity troponin I (hsTnI) values and significant  changes across serial measurements may suggest ACS but many other  chronic and acute conditions are known to elevate hsTnI results.  Refer to the "Links" section for chest pain algorithms and additional  guidance. Performed at [facility], [place] [address] [place], [place] [address]   Brain natriuretic peptide     Status: Abnormal   Collection Time: 10/08/21  7:47 PM  Result Value Ref Range   B Natriuretic Peptide 1,373.8 (H) 0.0 - 100.0 pg/mL    Comment: Performed at [facility], [place] [address], [place], [place] [address]  Lactic acid, plasma     Status: Abnormal   Collection Time: 10/08/21  7:47 PM  Result Value Ref Range   Lactic Acid, Venous 4.4 (HH) 0.5 - 1.9 mmol/L    Comment: CRITICAL RESULT  CALLED TO, READ BACK BY AND VERIFIED WITH: Rochel Brome RN  10/08/21 @ 2019 VS Performed at [facility], [place] [address] [place], [place] [address]   Culture,  blood (routine x 2)     Status: None (Preliminary result)   Collection Time: 10/08/21  7:50 PM   Specimen: BLOOD  Result Value Ref Range   Specimen Description      BLOOD Performed at [facility], [place] [address] [place], [place] [address]    Special Requests      BOTTLES DRAWN AEROBIC AND ANAEROBIC Blood Culture adequate volume Performed at [facility] [address] [place], [place] [address]    Culture      NO GROWTH 2 DAYS Performed at [facility], [place] [address] [place], [place] [address]    Report Status PENDING   Procalcitonin - Baseline     Status: None   Collection Time: 10/08/21  9:50 PM  Result Value Ref Range   Procalcitonin 0.21 ng/mL    Comment:        Interpretation: PCT (Procalcitonin) <= 0.5 ng/mL: Systemic infection (sepsis) is not likely. Local bacterial infection is possible. (NOTE)       Sepsis PCT Algorithm           Lower Respiratory Tract                                      Infection PCT Algorithm    ----------------------------     ----------------------------         PCT < 0.25 ng/mL                PCT < 0.10 ng/mL          Strongly encourage             Strongly discourage   discontinuation of antibiotics    initiation of antibiotics    ----------------------------     -----------------------------       PCT 0.25 - 0.50 ng/mL            PCT 0.10 - 0.25 ng/mL               OR       >80% decrease in PCT            Discourage initiation of                                            antibiotics      Encourage discontinuation           of antibiotics    ----------------------------     -----------------------------         PCT >= 0.50 ng/mL              PCT 0.26 - 0.50 ng/mL               AND        <80% decrease in PCT              Encourage initiation of                                             antibiotics       Encourage continuation           of antibiotics    ----------------------------     -----------------------------  PCT >= 0.50 ng/mL                  PCT > 0.50 ng/mL               AND         increase in PCT                  Strongly encourage                                      initiation of antibiotics    Strongly encourage escalation           of antibiotics                                     -----------------------------                                           PCT <= 0.25 ng/mL                                                 OR                                        > 80% decrease in PCT                                      Discontinue / Do not initiate                                             antibiotics  Performed at [facility] [address], [place], [place] [address]   Troponin I (High Sensitivity)     Status: Abnormal   Collection Time: 10/08/21  9:50 PM  Result Value Ref Range   Troponin I (High Sensitivity) 44 (H) <18 ng/L    Comment: (NOTE) Elevated high sensitivity troponin I (hsTnI) values and significant  changes across serial measurements may suggest ACS but many other  chronic and acute conditions are known to elevate hsTnI results.  Refer to the "Links" section for chest pain algorithms and additional  guidance. Performed at [facility], [place] [address], [place], [place] [address]   Blood gas, arterial     Status: Abnormal   Collection Time: 10/08/21 10:00 PM  Result Value Ref Range   pH, Arterial 7.42 7.35 - 7.45   pCO2 arterial 34 32 - 48 mmHg   pO2, Arterial 69 (L) 83 - 108 mmHg   Bicarbonate 22.1 20.0 - 28.0 mmol/L   Acid-base deficit 1.8 0.0 - 2.0 mmol/L   O2 Saturation 94.8 %   Patient temperature 36.9    Allens test (pass/fail) PASS PASS    Comment: Performed at [facility], [address] [place], [place] [address]  Resp Panel by RT-PCR (Flu A&B, Covid) Nasopharyngeal Swab     Status: None   Collection Time: 10/08/21 10:00 PM   Specimen: Nasopharyngeal Swab; Nasopharyngeal(NP) swabs in vial transport medium  Result Value Ref Range   SARS Coronavirus 2 by RT PCR NEGATIVE NEGATIVE    Comment: (NOTE) SARS-CoV-2 target nucleic acids are NOT DETECTED.  The SARS-CoV-2 RNA is generally detectable in upper respiratory specimens during the acute phase of infection. The lowest concentration of SARS-CoV-2 viral copies this assay can detect is 138 copies/mL. A negative result does not preclude SARS-Cov-2 infection and should not be used as the sole basis for treatment or other patient management decisions. A negative result may occur with  improper specimen collection/handling, submission of specimen other than nasopharyngeal swab, presence of viral mutation(s) within the areas targeted by this assay, and inadequate number of viral copies(<138 copies/mL). A negative result must be combined with clinical observations, patient history, and epidemiological information. The expected result is Negative.  Fact Sheet for Patients:  EntrepreneurPulse.com.au  Fact Sheet for Healthcare Providers:  IncredibleEmployment.be  This test is no t yet approved or cleared by the [place] FDA and  has been authorized for detection and/or diagnosis of SARS-CoV-2 by FDA under an Emergency Use Authorization (EUA). This EUA will remain  in effect (meaning this test can be used) for the duration of the COVID-19 declaration under Section 564(b)(1) of the Act, 21 U.S.C.section 360bbb-3(b)(1), unless the authorization is terminated  or revoked sooner.       Influenza A by PCR NEGATIVE NEGATIVE   Influenza B by PCR NEGATIVE NEGATIVE    Comment: (NOTE) The Xpert Xpress SARS-CoV-2/FLU/RSV plus assay is intended as an aid in the diagnosis of influenza  from Nasopharyngeal swab specimens and should not be used as a sole basis for treatment. Nasal washings and aspirates are unacceptable for Xpert Xpress SARS-CoV-2/FLU/RSV testing.  Fact Sheet for Patients: EntrepreneurPulse.com.au  Fact Sheet for Healthcare Providers: IncredibleEmployment.be  This test is not yet approved or cleared by the [place] FDA and has been authorized for detection and/or diagnosis of SARS-CoV-2 by FDA under an Emergency Use Authorization (EUA). This EUA will remain in effect (meaning this test can be used) for the duration of the COVID-19 declaration under Section 564(b)(1) of the Act, 21 U.S.C. section 360bbb-3(b)(1), unless the authorization is terminated or revoked.  Performed at [facility], [place] [address], [place], [place] [address]   Lactic acid, plasma     Status: Abnormal   Collection Time: 10/08/21 11:05 PM  Result Value Ref Range   Lactic Acid, Venous 4.0 (HH) 0.5 - 1.9 mmol/L    Comment: CRITICAL RESULT CALLED TO, READ BACK BY AND VERIFIED WITH:  Rayburn Felt,  RN, 2329, 10/08/21, Johnette Abraham ADFEDOKUN Performed at [facility], [address], [place], [place] [address]   Culture, blood (routine x 2)     Status: None (Preliminary result)   Collection Time: 10/08/21 11:05 PM   Specimen: BLOOD  Result Value Ref Range   Specimen Description BLOOD SITE NOT SPECIFIED    Special Requests      BOTTLES DRAWN AEROBIC AND ANAEROBIC Blood Culture adequate volume   Culture      NO GROWTH 1 DAY Performed at [facility], [place] [address] [place], [place] [address]    Report Status PENDING   HIV Antibody (routine testing w rflx)     Status: None  Collection Time: 10/08/21 11:05 PM  Result Value Ref Range   HIV Screen 4th Generation wRfx Non Reactive Non Reactive    Comment: Performed at [facility], [place] [address], [place], [place] [address]  CBC     Status: Abnormal   Collection  Time: 10/08/21 11:05 PM  Result Value Ref Range   WBC 12.5 (H) 4.0 - 10.5 K/uL   RBC 5.43 (H) 3.87 - 5.11 MIL/uL   Hemoglobin 13.5 12.0 - 15.0 g/dL   HCT 42.4 36.0 - 46.0 %   MCV 78.1 (L) 80.0 - 100.0 fL   MCH 24.9 (L) 26.0 - 34.0 pg   MCHC 31.8 30.0 - 36.0 g/dL   RDW 13.9 11.5 - 15.5 %   Platelets 311 150 - 400 K/uL   nRBC 0.0 0.0 - 0.2 %    Comment: Performed at [facility], [place] [address] [place], [place] [address]  Protime-INR     Status: Abnormal   Collection Time: 10/08/21 11:05 PM  Result Value Ref Range   Prothrombin Time 16.6 (H) 11.4 - 15.2 seconds   INR 1.3 (H) 0.8 - 1.2    Comment: (NOTE) INR goal varies based on device and disease states. Performed at [facility], [place] [address], [place], [place] [address]   Magnesium     Status: Abnormal   Collection Time: 10/08/21 11:05 PM  Result Value Ref Range   Magnesium 1.6 (L) 1.7 - 2.4 mg/dL    Comment: Performed at [facility] [address] [place], [place] [address]  Troponin I (High Sensitivity)     Status: Abnormal   Collection Time: 10/08/21 11:05 PM  Result Value Ref Range   Troponin I (High Sensitivity) 58 (H) <18 ng/L    Comment: (NOTE) Elevated high sensitivity troponin I (hsTnI) values and significant  changes across serial measurements may suggest ACS but many other  chronic and acute conditions are known to elevate hsTnI results.  Refer to the "Links" section for chest pain algorithms and additional  guidance. Performed at [facility], [place] [address] [place], [place] [address]   Basic metabolic panel     Status: Abnormal   Collection Time: 10/08/21 11:05 PM  Result Value Ref Range   Sodium 135 135 - 145 mmol/L   Potassium 3.4 (L) 3.5 - 5.1 mmol/L   Chloride 96 (L) 98 - 111 mmol/L   CO2 24 22 - 32 mmol/L   Glucose, Bld 330 (H) 70 - 99 mg/dL    Comment: Glucose reference range applies only to samples taken after fasting for at least 8 hours.   BUN 8 6 - 20 mg/dL    Creatinine, Ser 0.79 0.44 - 1.00 mg/dL   Calcium 8.5 (L) 8.9 - 10.3 mg/dL   GFR, Estimated >60 >60 mL/min    Comment: (NOTE) Calculated using the CKD-EPI Creatinine Equation (2021)    Anion gap 15 5 - 15    Comment: Performed at [facility] [address], [place], [place] [address]  TSH     Status: None   Collection Time: 10/09/21 12:56 AM  Result Value Ref Range   TSH 2.060 0.350 - 4.500 uIU/mL    Comment: Performed by a 3rd Generation assay with a functional sensitivity of <=0.01 uIU/mL. Performed at [facility], [place] [address], [place], [place] [address]   Lactic acid, plasma     Status: Abnormal   Collection Time: 10/09/21  2:27 AM  Result Value Ref Range   Lactic Acid, Venous 3.0 (HH) 0.5 - 1.9 mmol/L    Comment: CRITICAL VALUE NOTED.  VALUE IS CONSISTENT WITH PREVIOUSLY REPORTED AND CALLED VALUE. Performed at [facility], [place] [address] [place], [place] [address]   Cooxemetry Panel (carboxy, met, total hgb, O2 sat)     Status: Abnormal   Collection Time: 10/09/21  8:10 AM  Result Value Ref Range   Total hemoglobin 12.5 12.0 - 16.0 g/dL   O2 Saturation 62.9 %   Carboxyhemoglobin 0.5 0.5 - 1.5 %   Methemoglobin 2.5 (H) 0.0 - 1.5 %    Comment: Performed at [facility] [address] [place], [place] [address]  Troponin I (High Sensitivity)     Status: Abnormal   Collection Time: 10/09/21  8:12 AM  Result Value Ref Range   Troponin I (High Sensitivity) 135 (HH) <18 ng/L    Comment: CRITICAL RESULT CALLED TO, READ BACK BY AND VERIFIED WITH: K.MOON,RN 7628 10/09/21 CLARK,S (NOTE) Elevated high sensitivity troponin I (hsTnI) values and significant  changes across serial measurements may suggest ACS but many other  chronic and acute conditions are known to elevate hsTnI results.  Refer to the Links section for chest pain algorithms and additional  guidance. Performed at [facility], [place] [address] [place], [place] [address]   Lactic  acid, plasma     Status: Abnormal   Collection Time: 10/09/21  8:21 AM  Result Value Ref Range   Lactic Acid, Venous 2.1 (HH) 0.5 - 1.9 mmol/L    Comment: CRITICAL VALUE NOTED.  VALUE IS CONSISTENT WITH PREVIOUSLY REPORTED AND CALLED VALUE. Performed at [facility], [place] [address] [place], [place] [address]   CBG monitoring, ED     Status: Abnormal   Collection Time: 10/09/21 12:15 PM  Result Value Ref Range   Glucose-Capillary 194 (H) 70 - 99 mg/dL    Comment: Glucose reference range applies only to samples taken after fasting for at least 8 hours.  I-STAT 7, (LYTES, BLD GAS, ICA, H+H)     Status: Abnormal   Collection Time: 10/09/21 12:53 PM  Result Value Ref Range   pH, Arterial 7.534 (H) 7.35 - 7.45   pCO2 arterial 33.9 32 - 48 mmHg   pO2, Arterial 72 (L) 83 - 108 mmHg   Bicarbonate 28.6 (H) 20.0 - 28.0 mmol/L   TCO2 30 22 - 32 mmol/L   O2 Saturation 96 %   Acid-Base Excess 6.0 (H) 0.0 - 2.0 mmol/L   Sodium 138 135 - 145 mmol/L   Potassium 3.4 (L) 3.5 - 5.1 mmol/L   Calcium, Ion 1.11 (L) 1.15 - 1.40 mmol/L   HCT 37.0 36.0 - 46.0 %   Hemoglobin 12.6 12.0 - 15.0 g/dL   Sample type ARTERIAL   POCT I-Stat EG7     Status: Abnormal   Collection Time: 10/09/21 12:56 PM  Result Value Ref Range   pH, Ven 7.444 (H) 7.25 - 7.43   pCO2, Ven 45.3 44 - 60 mmHg   pO2, Ven 39 32 - 45 mmHg   Bicarbonate 31.1 (H) 20.0 - 28.0 mmol/L   TCO2 32 22 - 32 mmol/L   O2 Saturation 75 %   Acid-Base Excess 6.0 (H) 0.0 - 2.0 mmol/L   Sodium 139 135 - 145 mmol/L   Potassium 3.3 (L) 3.5 - 5.1 mmol/L   Calcium, Ion 1.07 (L) 1.15 - 1.40 mmol/L  HCT 36.0 36.0 - 46.0 %   Hemoglobin 12.2 12.0 - 15.0 g/dL   Sample type MIXED VENOUS SAMPLE    Comment NOTIFIED PHYSICIAN   POCT I-Stat EG7     Status: Abnormal   Collection Time: 10/09/21 12:59 PM  Result Value Ref Range   pH, Ven 7.462 (H) 7.25 - 7.43   pCO2, Ven 43.4 (L) 44 - 60 mmHg   pO2, Ven 33 32 - 45 mmHg   Bicarbonate 31.0 (H) 20.0 -  28.0 mmol/L   TCO2 32 22 - 32 mmol/L   O2 Saturation 68 %   Acid-Base Excess 6.0 (H) 0.0 - 2.0 mmol/L   Sodium 139 135 - 145 mmol/L   Potassium 3.4 (L) 3.5 - 5.1 mmol/L   Calcium, Ion 1.12 (L) 1.15 - 1.40 mmol/L   HCT 37.0 36.0 - 46.0 %   Hemoglobin 12.6 12.0 - 15.0 g/dL   Sample type MIXED VENOUS SAMPLE    Comment NOTIFIED PHYSICIAN   Hemoglobin A1c     Status: Abnormal   Collection Time: 10/09/21  4:33 PM  Result Value Ref Range   Hgb A1c MFr Bld 11.5 (H) 4.8 - 5.6 %    Comment: (NOTE) Pre diabetes:          5.7%-6.4%  Diabetes:              >6.4%  Glycemic control for   <7.0% adults with diabetes    Mean Plasma Glucose 283.35 mg/dL    Comment: Performed at [facility], [address], [place], [place] [address]  Procalcitonin - Baseline     Status: None   Collection Time: 10/09/21  5:53 PM  Result Value Ref Range   Procalcitonin 0.81 ng/mL    Comment:        Interpretation: PCT > 0.5 ng/mL and <= 2 ng/mL: Systemic infection (sepsis) is possible, but other conditions are known to elevate PCT as well. (NOTE)       Sepsis PCT Algorithm           Lower Respiratory Tract                                      Infection PCT Algorithm    ----------------------------     ----------------------------         PCT < 0.25 ng/mL                PCT < 0.10 ng/mL          Strongly encourage             Strongly discourage   discontinuation of antibiotics    initiation of antibiotics    ----------------------------     -----------------------------       PCT 0.25 - 0.50 ng/mL            PCT 0.10 - 0.25 ng/mL               OR       >80% decrease in PCT            Discourage initiation of                                            antibiotics      Encourage discontinuation  of antibiotics    ----------------------------     -----------------------------         PCT >= 0.50 ng/mL              PCT 0.26 - 0.50 ng/mL                AND       <80% decrease in PCT              Encourage initiation of                                             antibiotics       Encourage continuation           of antibiotics    ----------------------------     -----------------------------        PCT >= 0.50 ng/mL                  PCT > 0.50 ng/mL               AND         increase in PCT                  Strongly encourage                                      initiation of antibiotics    Strongly encourage escalation           of antibiotics                                     -----------------------------                                           PCT <= 0.25 ng/mL                                                 OR                                        > 80% decrease in PCT                                      Discontinue / Do not initiate                                             antibiotics  Performed at [facility], [address], [place], [place] [address]   .Cooxemetry Panel (carboxy, met, total hgb, O2 sat)     Status: Abnormal   Collection Time: 10/09/21  5:53 PM  Result Value Ref Range  Total hemoglobin 12.1 12.0 - 16.0 g/dL   O2 Saturation 57.5 %   Carboxyhemoglobin 1.6 (H) 0.5 - 1.5 %   Methemoglobin 1.1 0.0 - 1.5 %    Comment: Performed at [facility] [address] [place], [place] [address]  Surgical PCR screen     Status: None   Collection Time: 10/09/21  5:53 PM   Specimen: Nasal Mucosa; Nasal Swab  Result Value Ref Range   MRSA, PCR NEGATIVE NEGATIVE   Staphylococcus aureus NEGATIVE NEGATIVE    Comment: (NOTE) The Xpert SA Assay (FDA approved for NASAL specimens in patients 98 years of age and older), is one component of a comprehensive surveillance program. It is not intended to diagnose infection nor to guide or monitor treatment. Performed at [facility], [place] [address] [place], [place] [address]   Heparin level (unfractionated)     Status: Abnormal   Collection Time: 10/10/21  3:58 AM  Result Value Ref  Range   Heparin Unfractionated <0.10 (L) 0.30 - 0.70 IU/mL    Comment: (NOTE) The clinical reportable range upper limit is being lowered to >1.10 to align with the FDA approved guidance for the current laboratory assay.  If heparin results are below expected values, and patient dosage has  been confirmed, suggest follow up testing of antithrombin III levels. Performed at [facility], [place] [address], [place], [place] [address]   Basic metabolic panel     Status: Abnormal   Collection Time: 10/10/21  3:58 AM  Result Value Ref Range   Sodium 135 135 - 145 mmol/L   Potassium 3.0 (L) 3.5 - 5.1 mmol/L   Chloride 99 98 - 111 mmol/L   CO2 29 22 - 32 mmol/L   Glucose, Bld 321 (H) 70 - 99 mg/dL    Comment: Glucose reference range applies only to samples taken after fasting for at least 8 hours.   BUN 12 6 - 20 mg/dL   Creatinine, Ser 0.82 0.44 - 1.00 mg/dL   Calcium 7.6 (L) 8.9 - 10.3 mg/dL   GFR, Estimated >60 >60 mL/min    Comment: (NOTE) Calculated using the CKD-EPI Creatinine Equation (2021)    Anion gap 7 5 - 15    Comment: Performed at [facility] [address], [place], [place] [address]  CBC     Status: Abnormal   Collection Time: 10/10/21  3:58 AM  Result Value Ref Range   WBC 8.5 4.0 - 10.5 K/uL   RBC 4.15 3.87 - 5.11 MIL/uL   Hemoglobin 10.3 (L) 12.0 - 15.0 g/dL   HCT 32.4 (L) 36.0 - 46.0 %   MCV 78.1 (L) 80.0 - 100.0 fL   MCH 24.8 (L) 26.0 - 34.0 pg   MCHC 31.8 30.0 - 36.0 g/dL   RDW 13.9 11.5 - 15.5 %   Platelets 221 150 - 400 K/uL   nRBC 0.0 0.0 - 0.2 %    Comment: Performed at [facility], [place] [address], [place], [place] [address]  Lipid panel     Status: Abnormal   Collection Time: 10/10/21  3:58 AM  Result Value Ref Range   Cholesterol 154 0 - 200 mg/dL   Triglycerides 97 <150 mg/dL   HDL 33 (L) >40 mg/dL   Total CHOL/HDL Ratio 4.7 RATIO   VLDL 19 0 - 40 mg/dL   LDL Cholesterol 102 (H) 0 - 99 mg/dL    Comment:  Total  Cholesterol/HDL:CHD Risk Coronary Heart Disease Risk Table                     Men   Women  1/2 Average Risk   3.4   3.3  Average Risk       5.0   4.4  2 X Average Risk   9.6   7.1  3 X Average Risk  23.4   11.0        Use the calculated Patient Ratio above and the CHD Risk Table to determine the patient's CHD Risk.        ATP III CLASSIFICATION (LDL):  <100     mg/dL   Optimal  100-129  mg/dL   Near or Above                    Optimal  130-159  mg/dL   Borderline  160-189  mg/dL   High  >190     mg/dL   Very High Performed at [facility] [address], [place], [place] [address]   Prealbumin     Status: Abnormal   Collection Time: 10/10/21  3:58 AM  Result Value Ref Range   Prealbumin 8.4 (L) 18 - 38 mg/dL    Comment: Performed at [facility] [address] [place], [place] [address]  Urinalysis, Complete w Microscopic     Status: Abnormal   Collection Time: 10/10/21  5:18 AM  Result Value Ref Range   Color, Urine YELLOW YELLOW   APPearance CLEAR CLEAR   Specific Gravity, Urine 1.026 1.005 - 1.030   pH 5.0 5.0 - 8.0   Glucose, UA >=500 (A) NEGATIVE mg/dL   Hgb urine dipstick SMALL (A) NEGATIVE   Bilirubin Urine NEGATIVE NEGATIVE   Ketones, ur NEGATIVE NEGATIVE mg/dL   Protein, ur 30 (A) NEGATIVE mg/dL   Nitrite NEGATIVE NEGATIVE   Leukocytes,Ua MODERATE (A) NEGATIVE   RBC / HPF 0-5 0 - 5 RBC/hpf   WBC, UA 21-50 0 - 5 WBC/hpf   Bacteria, UA NONE SEEN NONE SEEN   Squamous Epithelial / LPF 0-5 0 - 5   Mucus PRESENT    Budding Yeast PRESENT     Comment: Performed at [facility], [place] [address], [place], [place] [address]  Cooxemetry Panel (carboxy, met, total hgb, O2 sat)     Status: Abnormal   Collection Time: 10/10/21  5:18 AM  Result Value Ref Range   Total hemoglobin 10.6 (L) 12.0 - 16.0 g/dL   O2 Saturation 75.8 %   Carboxyhemoglobin 1.5 0.5 - 1.5 %   Methemoglobin <0.7 0.0 - 1.5 %    Comment: Performed at [facility], [place] [address] [place], [place] [address]  Troponin I (High Sensitivity)     Status: Abnormal   Collection Time: 10/10/21  7:50 AM  Result Value Ref Range   Troponin I (High Sensitivity) 107 (HH) <18 ng/L    Comment: CRITICAL VALUE NOTED.  VALUE IS CONSISTENT WITH PREVIOUSLY REPORTED AND CALLED VALUE. (NOTE) Elevated high sensitivity troponin I (hsTnI) values and significant  changes across serial measurements may suggest ACS but many other  chronic and acute conditions are known to elevate hsTnI results.  Refer to the Links section for chest pain algorithms and additional  guidance. Performed at [facility], [place] [address], [place], [place] [address]   Magnesium     Status: Abnormal  Collection Time: 10/10/21  7:50 AM  Result Value Ref Range   Magnesium 1.5 (L) 1.7 - 2.4 mg/dL    Comment: Performed at [facility] [address] [place], [place] [address]  Glucose, capillary     Status: Abnormal   Collection Time: 10/10/21 11:27 AM  Result Value Ref Range   Glucose-Capillary 220 (H) 70 - 99 mg/dL    Comment: Glucose reference range applies only to samples taken after fasting for at least 8 hours.  Heparin level (unfractionated)     Status: Abnormal   Collection Time: 10/10/21  1:35 PM  Result Value Ref Range   Heparin Unfractionated 0.28 (L) 0.30 - 0.70 IU/mL    Comment: (NOTE) The clinical reportable range upper limit is being lowered to >1.10 to align with the FDA approved guidance for the current laboratory assay.  If heparin results are below expected values, and patient dosage has  been confirmed, suggest follow up testing of antithrombin III levels. Performed at [facility], [place] [address], [place], [place] [address]   Troponin I (High Sensitivity)     Status: Abnormal   Collection Time: 10/10/21  1:45 PM  Result Value Ref Range   Troponin I (High Sensitivity) 93 (H) <18 ng/L    Comment: (NOTE) Elevated high sensitivity troponin I (hsTnI) values and  significant  changes across serial measurements may suggest ACS but many other  chronic and acute conditions are known to elevate hsTnI results.  Refer to the "Links" section for chest pain algorithms and additional  guidance. Performed at [facility], [place] [address], [place], [place] [address]     CT Angio Chest PE W and/or Wo Contrast  Result Date: 10/08/2021 CLINICAL DATA:  Pulmonary embolism (PE) suspected, unknown D-dimer. reports CP and SOB w/ non-productive cough since 3:00pm today, also reports LE edema that has been ongoing. States she cannot get enough air, RR 34, O2 94% RA. Also reports blood in urine for the past 6 months. EXAM: CT ANGIOGRAPHY CHEST WITH CONTRAST TECHNIQUE: Multidetector CT imaging of the chest was performed using the standard protocol during bolus administration of intravenous contrast. Multiplanar CT image reconstructions and MIPs were obtained to evaluate the vascular anatomy. RADIATION DOSE REDUCTION: This exam was performed according to the departmental dose-optimization program which includes automated exposure control, adjustment of the mA and/or kV according to patient size and/or use of iterative reconstruction technique. CONTRAST:  53mL OMNIPAQUE IOHEXOL 350 MG/ML SOLN COMPARISON:  None. FINDINGS: Cardiovascular: Satisfactory opacification of the pulmonary arteries to the segmental level. No evidence of pulmonary embolism. The main pulmonary artery is normal in caliber. Normal heart size. No significant pericardial effusion. The thoracic aorta is normal in caliber. At least mild atherosclerotic plaque of the thoracic aorta. Four-vessel coronary artery calcifications. Mediastinum/Nodes: No enlarged mediastinal, hilar, or axillary lymph nodes. Thyroid gland, trachea, and esophagus demonstrate no significant findings. Lungs/Pleura: Limited evaluation due to respiratory motion artifact. Partial collapse of bilateral lower lobes in the right middle lobe. Passive  atelectasis of the right upper lobe and lingula. No focal consolidation. No pulmonary nodule. No pulmonary mass. Bilateral moderate volume pleural effusions. No pneumothorax. Upper Abdomen: No acute abnormality. Musculoskeletal: No abdominal wall hernia or abnormality. No suspicious lytic or blastic osseous lesions. No acute displaced fracture. Multilevel degenerative changes of the spine. Review of the MIP images confirms the above findings. IMPRESSION: 1. No pulmonary embolus. 2. Bilateral moderate pleural effusions. 3. Aortic Atherosclerosis (ICD10-I70.0) including four-vessel coronary calcifications.  Electronically Signed   By: Iven Finn M.D.   On: 10/08/2021 21:59   CARDIAC CATHETERIZATION  Result Date: 10/09/2021   Prox RCA lesion is 100% stenosed.   Ost LM lesion is 60% stenosed.   Mid LAD lesion is 80% stenosed.   2nd Diag lesion is 85% stenosed.   1st Diag lesion is 99% stenosed.   Prox LAD lesion is 80% stenosed.   Mid Cx lesion is 70% stenosed.   The left ventricular ejection fraction is 25-35% by visual estimate. 1.  Severe multivessel disease. 2.  Severe LV dysfunction 3.  Fick cardiac output of 5.2 L/min and Fick cardiac index of 3.4 L a minute per meter squared with a PA saturation of 68%. 4.  Thermodilution outputs done in triplicate demonstrated a cardiac output of 3 L/min and an index of 2 L/min/m; milrinone was increased to 2.5 mcg per kilogram per minute. 5.  Cardiac hemodynamics as follows: A.  Right atrial pressure 9/7 with a mean of 5 mmHg B.  RV 38/3 with an end-diastolic pressure of 18 mmHg C.  Wedge pressure of 12/11 with a mean of 10 mmHg D.  Pulmonary artery pressure of 23/12 with a mean pressure of 17 mmHg; PAPI =9/5=1.8 E.  LVEDP of 77mHg. The results were reviewed with Dr. BHaroldine Laws  The patient be transferred to the to heart unit for medical optimization.  No mechanical circulatory support was pursued.   DG Chest Port 1 View  Result Date: 10/08/2021 CLINICAL DATA:   Shortness of breath, chest pain EXAM: PORTABLE CHEST 1 VIEW COMPARISON:  11/08/2011 FINDINGS: Small right pleural effusion and moderate left pleural effusion. Bibasilar atelectasis or infiltrates. Heart is normal size. No effusions. IMPRESSION: Bilateral effusions, left larger than right. Bibasilar atelectasis or infiltrates. Electronically Signed   By: KRolm BaptiseM.D.   On: 10/08/2021 19:40   ECHOCARDIOGRAM COMPLETE  Result Date: 10/09/2021    ECHOCARDIOGRAM REPORT   Patient Name:   Brooke CHEADate of Exam: 10/09/2021 Medical Rec #:  [id number]     Height:       64.5 in Accession #:    [id number]    Weight:       105.8 lb Date of Birth:  121-May-1962     BSA:          1.501 m Patient Age:    61years       BP:           123/87 mmHg Patient Gender: F              HR:           80 bpm. Exam Location:  Inpatient Procedure: 2D Echo Indications:    Acute systolic CHF  History:        Patient has no prior history of Echocardiogram examinations.                 Signs/Symptoms:Chest Pain; Risk Factors:Diabetes and                 Hypertension.  Sonographer:    SArlyss GandyReferring Phys: [id number] GulchT NIPP  Sonographer Comments: Supine. IMPRESSIONS  1. Left ventricular ejection fraction, by estimation, is 20 to 25%. The left ventricle has severely decreased function. The left ventricle demonstrates global hypokinesis with regional variation, particularly worse at the anterior apex and apex. Left ventricular diastolic parameters are consistent with Grade II diastolic dysfunction (pseudonormalization). Elevated left ventricular end-diastolic pressure. The E/e'  is 21.  2. Right ventricular systolic function is moderately reduced. The right ventricular size is normal. Tricuspid regurgitation signal is inadequate for assessing PA pressure.  3. Left atrial size was mildly dilated.  4. The mitral valve is abnormal. Trivial mitral valve regurgitation.  5. The aortic valve is tricuspid. Aortic valve regurgitation is  not visualized. Aortic valve sclerosis is present, with no evidence of aortic valve stenosis.  6. The inferior vena cava is dilated in size with <50% respiratory variability, suggesting right atrial pressure of 15 mmHg. Comparison(s): No prior Echocardiogram. FINDINGS  Left Ventricle: Left ventricular ejection fraction, by estimation, is 20 to 25%. The left ventricle has severely decreased function. The left ventricle demonstrates global hypokinesis. The left ventricular internal cavity size was normal in size. There is no left ventricular hypertrophy. Left ventricular diastolic parameters are consistent with Grade II diastolic dysfunction (pseudonormalization). Elevated left ventricular end-diastolic pressure. The E/e' is 21. Right Ventricle: The right ventricular size is normal. No increase in right ventricular wall thickness. Right ventricular systolic function is moderately reduced. Tricuspid regurgitation signal is inadequate for assessing PA pressure. Left Atrium: Left atrial size was mildly dilated. Right Atrium: Right atrial size was normal in size. Pericardium: There is no evidence of pericardial effusion. Mitral Valve: The mitral valve is abnormal. There is mild thickening of the anterior and posterior mitral valve leaflet(s). There is mild calcification of the mitral valve leaflet(s). Trivial mitral valve regurgitation. Tricuspid Valve: The tricuspid valve is grossly normal. Tricuspid valve regurgitation is not demonstrated. Aortic Valve: The aortic valve is tricuspid. Aortic valve regurgitation is not visualized. Aortic valve sclerosis is present, with no evidence of aortic valve stenosis. Aortic valve mean gradient measures 2.0 mmHg. Aortic valve peak gradient measures 3.1  mmHg. Aortic valve area, by VTI measures 1.65 cm. Pulmonic Valve: The pulmonic valve was normal in structure. Pulmonic valve regurgitation is not visualized. Aorta: The aortic root and ascending aorta are structurally normal, with  no evidence of dilitation. Venous: The inferior vena cava is dilated in size with less than 50% respiratory variability, suggesting right atrial pressure of 15 mmHg. IAS/Shunts: No atrial level shunt detected by color flow Doppler. Additional Comments: There is pleural effusion in both left and right lateral regions.  LEFT VENTRICLE PLAX 2D LVIDd:         4.50 cm   Diastology LVIDs:         4.10 cm   LV e' medial:    4.19 cm/s LV PW:         0.80 cm   LV E/e' medial:  23.2 LV IVS:        0.80 cm   LV e' lateral:   5.22 cm/s LVOT diam:     2.00 cm   LV E/e' lateral: 18.6 LV SV:         25 LV SV Index:   17 LVOT Area:     3.14 cm  RIGHT VENTRICLE            IVC RV Basal diam:  3.30 cm    IVC diam: 2.30 cm RV Mid diam:    3.30 cm RV S prime:     6.60 cm/s TAPSE (M-mode): 1.6 cm LEFT ATRIUM             Index        RIGHT ATRIUM           Index LA diam:        3.00 cm 2.00  cm/m   RA Area:     12.90 cm LA Vol (A2C):   21.7 ml 14.46 ml/m  RA Volume:   30.90 ml  20.59 ml/m LA Vol (A4C):   51.2 ml 34.12 ml/m LA Biplane Vol: 36.7 ml 24.46 ml/m  AORTIC VALVE AV Area (Vmax):    1.79 cm AV Area (Vmean):   1.68 cm AV Area (VTI):     1.65 cm AV Vmax:           88.00 cm/s AV Vmean:          59.200 cm/s AV VTI:            0.153 m AV Peak Grad:      3.1 mmHg AV Mean Grad:      2.0 mmHg LVOT Vmax:         50.20 cm/s LVOT Vmean:        31.600 cm/s LVOT VTI:          0.080 m LVOT/AV VTI ratio: 0.53  AORTA Ao Root diam: 3.10 cm Ao Asc diam:  3.10 cm MITRAL VALVE MV Area (PHT): 5.62 cm    SHUNTS MV Decel Time: 135 msec    Systemic VTI:  0.08 m MV E velocity: 97.10 cm/s  Systemic Diam: 2.00 cm MV A velocity: 74.80 cm/s MV E/A ratio:  1.30 Lyman Bishop MD Electronically signed by Lyman Bishop MD Signature Date/Time: 10/09/2021/11:30:25 AM    Final      I personally reviewed the Cath images. Severe LV dysfunction with severe 3 vessel CAD with poor targets  Review of Systems  Constitutional:  Positive for fatigue.   Respiratory:  Positive for shortness of breath.   Cardiovascular:  Positive for chest pain and leg swelling.  Genitourinary:  Positive for pelvic pain.  Blood pressure 110/67, pulse 87, temperature 99 F (37.2 C), resp. rate (!) 24, height 5' 4.5" (1.638 m), weight 51.5 kg, last menstrual period 11/16/2014, SpO2 98 %. Physical Exam Vitals reviewed.  Constitutional:      Appearance: She is ill-appearing.  HENT:     Head: Normocephalic and atraumatic.  Cardiovascular:     Rate and Rhythm: Normal rate and regular rhythm.     Heart sounds:    Gallop (S4) present.  Pulmonary:     Effort: Pulmonary effort is normal.     Breath sounds: Normal breath sounds.  Abdominal:     Palpations: Abdomen is soft.  Musculoskeletal:     Right lower leg: Edema (2+) present.     Left lower leg: Edema (2+) present.  Neurological:     General: No focal deficit present.     Mental Status: She is alert and oriented to person, place, and time.    Assessment/Plan: 61 yo woman with long standing history of poorly controlled diabetes and medical noncompliance presents with decompensated acute on chronic systolic heart failure. 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  Asked to evaluate for possible CABG  Unfortunately she has horribly diseased vessels with no good target sites for CABG. Unlikely to benefit and probably would fail to wean from bypass given severity of LV dysfunction.  Recommend continuing to maximize medical therapy  Melrose Nakayama 10/10/2021, 3:11 PM

## 2021-10-10 NOTE — Progress Notes (Signed)
Pt has an order for PICC placement, per Dr.Narcisse (cardiology fellow in house MD) ordered that it's ok to hold off picc line insertion for now. IV Team will follow up in the morning. ?

## 2021-10-10 NOTE — Progress Notes (Addendum)
Inpatient Diabetes Program Recommendations ? ?AACE/ADA: New Consensus Statement on Inpatient Glycemic Control (2015) ? ?Target Ranges:  Prepandial:   less than 140 mg/dL ?     Peak postprandial:   less than 180 mg/dL (1-2 hours) ?     Critically ill patients:  140 - 180 mg/dL  ? ?Lab Results  ?Component Value Date  ? GLUCAP 194 (H) 10/09/2021  ? HGBA1C 11.5 (H) 10/09/2021  ? ? ?Review of Glycemic Control ? Latest Reference Range & Units 10/10/21 03:58  ?Glucose 70 - 99 mg/dL 321 (H)  ?(H): Data is abnormally high ? ?Diabetes history: DM2 ?Outpatient Diabetes medications: none ?Current orders for Inpatient glycemic control: Novolog 0-15 units TID ? ?Inpatient Diabetes Program Recommendations:   ? ?Semglee 8 units QD (0.15 units/kg) ? ?Plan to speak with patient today.   ? ?Addendum_0 : ? ?Spoke with patient and daughter at bedside.  Reviewed patient's current A1c of 11.5% (average blood sugar of 160 mg/dL). Explained what a A1c is and what it measures. Also reviewed goal A1c with patient, importance of good glucose control @ home, and blood sugar goals.  She had GDM with her daughter.  No Insulin requirements at that time.   ? ?Discussed basic pathophysiology of DM Type 2, basic home care, basic diabetes diet nutrition principles, importance of checking CBGs and maintaining good CBG control to prevent long-term and short-term complications. Reviewed signs and symptoms of hyperglycemia and hypoglycemia and how to treat hypoglycemia at home. Also reviewed blood sugar goals at home.  ?RNs to provide ongoing basic DM education at bedside with this patient. Have ordered educational booklet, insulin starter kit, and DM videos. Have also placed RD consult for DM diet education for this patient.   ? ?Her daughter was diagnosed with DM2 in January.  She administers insulin.  Mother is aware of how to use the insulin pen, 2 shot air shot, administration sites, hypoglycemia, signs, symptoms and treatments.   ? ?She does not  have insurance and will need medication assistance with TOC and TOC pharmacy.  No PCP.  Will ensure TOC order is placed.  Ordered LWWD, insulin starter kit and nursing education.  Will provide her with a glucometer prior to DC.   ? ?Will continue to follow while inpatient. ? ?Thank you, ?Reche Dixon, MSN, RN ?Diabetes Coordinator ?Inpatient Diabetes Program ?[phone] (team pager from 8a-5p) ? ? ? ?

## 2021-10-10 NOTE — Progress Notes (Signed)
? ?  Has had ongoing chest and abdominal pain throughout the day.  ? ?Started on IV NTG with minimal improvement.  ? ?Hstrop has been trending down. CVP and cardiac output stable.  ? ?D/w TCTS and patient turned down for CABG. ? ?Coronary angios reviewed with Dr. Ali Lowe. Possible a candidate for high-risk PCI of LM through mid LAD but not sure how much benefit this would give her.  ? ?Currently I think the best plan will be for medical management of her HF and CAD. Given decreasing trops doubt CP is cardiac. Will give GI cocktail.  ? ?Will plan cMRI on Monday to assess viability. Titrate GDMT. Ambulate. ? ?Additional 30 mins CCT ? ?Glori Bickers, MD  ?6:08 PM ? ?

## 2021-10-10 NOTE — Consult Note (Incomplete)
[address]       [place] [address]             [phone]        Brooke [name] Medical Record #[id number] Date of Birth: [birth year]  Referring: Jolaine Artist, MD Primary Care: None Primary Cardiologist:Mark Marlou Porch, MD  Reason for Consult: Multivessel coronary artery disease.  Evaluation for potential surgical revascularization.   History of Present Illness:         Current Activity/ Functional Status: {functional status:19517}   Zubrod Score: At the time of surgery this patients most appropriate activity status/level should be described as: []     0    Normal activity, no symptoms []     1    Restricted in physical strenuous activity but ambulatory, able to do out light work []     2    Ambulatory and capable of self care, unable to do work activities, up and about                 more than 50%  Of the time                            []     3    Only limited self care, in bed greater than 50% of waking hours []     4    Completely disabled, no self care, confined to bed or chair []     5    Moribund  Past Medical History:  Diagnosis Date   Abdominal pain, left lower quadrant 12/17/2015   Chest pain, unspecified 10/05/2003   Depressive disorder, not elsewhere classified 07/31/2005   Diabetes mellitus without complication ([facility]) 86/57/8469   Endometriosis 09/14/2012   Hypertension 11/23/2003   Unspecified constipation 06/24/2004    Past Surgical History:  Procedure Laterality Date   APPENDECTOMY  1996   Dr. Lindon Romp   BREAST SURGERY Bilateral 1996   Dr. Haskell Riling HERNIA REPAIR     OVARIAN CYST REMOVAL     RIGHT/LEFT HEART CATH AND CORONARY ANGIOGRAPHY N/A 10/09/2021   Procedure: RIGHT/LEFT HEART CATH AND CORONARY ANGIOGRAPHY;  Surgeon: Early Osmond, MD;  Location: [facility];  Service: Cardiovascular;  Laterality: N/A;    Social History   Tobacco Use  Smoking Status Never  Smokeless Tobacco Never     Social History   Substance and Sexual Activity  Alcohol Use No     Allergies  Allergen Reactions   Aspirin     Intolerance (aspirin sensitive stomach)   Darvocet [Propoxyphene N-Acetaminophen]    Erythromycin    Gabapentin Other (See Comments)    Suicidal ideations    Ibuprofen    Metformin And Related Nausea Only    Current Facility-Administered Medications  Medication Dose Route Frequency Provider Last Rate Last Admin   0.9 %  sodium chloride infusion  250 mL Intravenous PRN Nipp, Carriel T, MD   Stopped at 10/10/21 0936   0.9 %  sodium chloride infusion  250 mL Intravenous PRN Early Osmond, MD       0.9 %  sodium chloride infusion  250 mL Intravenous PRN Early Osmond, MD 10 mL/hr at 10/10/21 1100 Infusion Verify at 10/10/21 1100   acetaminophen (TYLENOL) tablet 650 mg  650 mg Oral Q4H PRN Early Osmond, MD       ALPRAZolam Duanne Moron) tablet 0.25 mg  0.25 mg Oral  Daily PRN Nipp, Carriel T, MD       alum & mag hydroxide-simeth (MAALOX/MYLANTA) 200-200-20 MG/5ML suspension 30 mL  30 mL Oral Once Bensimhon, Shaune Pascal, MD       aspirin chewable tablet 81 mg  81 mg Oral Daily Early Osmond, MD   81 mg at 10/10/21 0915   atorvastatin (LIPITOR) tablet 80 mg  80 mg Oral Daily Early Osmond, MD   80 mg at 10/10/21 0915   Chlorhexidine Gluconate Cloth 2 % PADS 6 each  6 each Topical Daily Rudean Haskell A, MD   6 each at 10/09/21 1630   diazepam (VALIUM) tablet 5 mg  5 mg Oral Daily PRN Nipp, Carriel T, MD       furosemide (LASIX) injection 40 mg  40 mg Intravenous BID Lyda Jester M, PA-C   40 mg at 10/10/21 0915   heparin ADULT infusion 100 units/mL (25000 units/238mL)  900 Units/hr Intravenous Continuous Einar Grad, RPH 9 mL/hr at 10/10/21 1505 900 Units/hr at 10/10/21 1505   insulin aspart (novoLOG) injection 0-15 Units  0-15 Units Subcutaneous TID WC Lyda Jester M, PA-C   5 Units at 10/10/21 1200   insulin glargine-yfgn [facility]) injection 8  Units  8 Units Subcutaneous Daily Lyda Jester M, PA-C   8 Units at 10/10/21 1325   losartan (COZAAR) tablet 12.5 mg  12.5 mg Oral QHS Simmons, Brittainy M, PA-C       milrinone (PRIMACOR) 20 MG/100 ML (0.2 mg/mL) infusion  0.5 mcg/kg/min Intravenous Continuous Early Osmond, MD 7.2 mL/hr at 10/10/21 1100 0.5 mcg/kg/min at 10/10/21 1100   nitroGLYCERIN 50 mg in dextrose 5 % 250 mL (0.2 mg/mL) infusion  0-200 mcg/min Intravenous Titrated Lyda Jester M, PA-C 1.5 mL/hr at 10/10/21 1331 5 mcg/min at 10/10/21 1331   ondansetron (ZOFRAN) injection 4 mg  4 mg Intravenous Q6H PRN Early Osmond, MD       pantoprazole (PROTONIX) EC tablet 40 mg  40 mg Oral Daily Nipp, Carriel T, MD   40 mg at 10/10/21 0915   potassium chloride SA (KLOR-CON M) CR tablet 40 mEq  40 mEq Oral BID Chandrasekhar, Mahesh A, MD   40 mEq at 10/10/21 0915   sodium chloride flush (NS) 0.9 % injection 3 mL  3 mL Intravenous Q12H Nipp, Carriel T, MD   3 mL at 10/09/21 2153   sodium chloride flush (NS) 0.9 % injection 3 mL  3 mL Intravenous PRN Nipp, Carriel T, MD       sodium chloride flush (NS) 0.9 % injection 3 mL  3 mL Intravenous Q12H Early Osmond, MD   3 mL at 10/09/21 2154   sodium chloride flush (NS) 0.9 % injection 3 mL  3 mL Intravenous PRN Early Osmond, MD       sodium chloride flush (NS) 0.9 % injection 3 mL  3 mL Intravenous Q12H Early Osmond, MD   3 mL at 10/09/21 2154   sodium chloride flush (NS) 0.9 % injection 3 mL  3 mL Intravenous PRN Early Osmond, MD       spironolactone (ALDACTONE) tablet 12.5 mg  12.5 mg Oral Daily Lyda Jester M, PA-C   12.5 mg at 10/10/21 0915    Medications Prior to Admission  Medication Sig Dispense Refill Last Dose   acetaminophen (TYLENOL) 500 MG tablet Take 1,000 mg by mouth every 6 (six) hours as needed for mild pain, fever or headache.   10/07/2021  ALPRAZolam (XANAX) 0.25 MG tablet Take 1 tablet (0.25 mg total) by mouth daily as needed for anxiety.  30 tablet 0 unk   Aspirin-Acetaminophen-Caffeine (PAMPRIN MAX PO) Take 1 tablet by mouth 2 (two) times daily as needed (pelvic pain).   Past Month   cholecalciferol (VITAMIN D3) 25 MCG (1000 UNIT) tablet Take 4,000 Units by mouth daily.   10/06/2021   diazepam (VALIUM) 5 MG tablet Place 1 tablet vaginally nightly as needed for muscle spasm/ pelvic pain. 30 tablet 0 unk   diphenhydrAMINE (BENADRYL) 25 MG tablet Take 50 mg by mouth every 6 (six) hours as needed for itching or allergies.   10/08/2021   vitamin B-12 (CYANOCOBALAMIN) 1000 MCG tablet Take 3,000 mcg by mouth daily.   Past Month   estradiol (ESTRACE) 0.1 MG/GM vaginal cream Place 0.5 g vaginally 2 (two) times a week. Place 0.5g nightly for two weeks then twice a week after (Patient not taking: Reported on 10/09/2021) 30 g 11 Not Taking    Family History  Problem Relation Age of Onset   Diabetes Mother    Hypertension Mother    Arthritis Mother        RA   Pancreatic cancer Father    Stomach cancer Neg Hx    Colon cancer Neg Hx    Esophageal cancer Neg Hx    Rectal cancer Neg Hx      Review of Systems:   ROS {Ros - complete:30496}     Cardiac Review of Systems: Y or  [    ]= no  Chest Pain [    ]  Resting SOB [   ] Exertional SOB  [  ]  Orthopnea [  ]   Pedal Edema [   ]    Palpitations [  ] Syncope  [  ]   Presyncope [   ]  General Review of Systems: [Y] = yes [  ]=no Constitional: recent weight change [  ]; anorexia [  ]; fatigue [  ]; nausea [  ]; night sweats [  ]; fever [  ]; or chills [  ]                                                               Dental: Last Dentist visit:   Eye : blurred vision [  ]; diplopia [   ]; vision changes [  ];  Amaurosis fugax[  ]; Resp: cough [  ];  wheezing[  ];  hemoptysis[  ]; shortness of breath[  ]; paroxysmal nocturnal dyspnea[  ]; dyspnea on exertion[  ]; or orthopnea[  ];  GI:  gallstones[  ], vomiting[  ];  dysphagia[  ]; melena[  ];  hematochezia [  ]; heartburn[  ];   Hx of   Colonoscopy[  ]; GU: kidney stones [  ]; hematuria[  ];   dysuria [  ];  nocturia[  ];  history of     obstruction [  ]; urinary frequency [  ]             Skin: rash, swelling[  ];, hair loss[  ];  peripheral edema[  ];  or itching[  ]; Musculosketetal: myalgias[  ];  joint swelling[  ];  joint erythema[  ];  joint pain[  ];  back pain[  ];  Heme/Lymph: bruising[  ];  bleeding[  ];  anemia[  ];  Neuro: TIA[  ];  headaches[  ];  stroke[  ];  vertigo[  ];  seizures[  ];   paresthesias[  ];  difficulty walking[  ];  Psych:depression[  ]; anxiety[  ];  Endocrine: diabetes[  ];  thyroid dysfunction[  ];            {Dental Care:21289::"Single injection performed as below:"}            {Flue/Pneumonia Vaccination Status:21291}      Physical Exam: BP 110/67    Pulse 87    Temp 99 F (37.2 C)    Resp (!) 24    Ht 5' 4.5" (1.638 m)    Wt 51.5 kg    LMP 11/16/2014 (Approximate)    SpO2 98%    BMI 19.19 kg/m    {physical exam:21449}  Diagnostic Studies & Laboratory data:     Recent Radiology Findings:   CT Angio Chest PE W and/or Wo Contrast  Result Date: 10/08/2021 CLINICAL DATA:  Pulmonary embolism (PE) suspected, unknown D-dimer. reports CP and SOB w/ non-productive cough since 3:00pm today, also reports LE edema that has been ongoing. States she cannot get enough air, RR 34, O2 94% RA. Also reports blood in urine for the past 6 months. EXAM: CT ANGIOGRAPHY CHEST WITH CONTRAST TECHNIQUE: Multidetector CT imaging of the chest was performed using the standard protocol during bolus administration of intravenous contrast. Multiplanar CT image reconstructions and MIPs were obtained to evaluate the vascular anatomy. RADIATION DOSE REDUCTION: This exam was performed according to the departmental dose-optimization program which includes automated exposure control, adjustment of the mA and/or kV according to patient size and/or use of iterative reconstruction technique. CONTRAST:  90mL OMNIPAQUE IOHEXOL 350  MG/ML SOLN COMPARISON:  None. FINDINGS: Cardiovascular: Satisfactory opacification of the pulmonary arteries to the segmental level. No evidence of pulmonary embolism. The main pulmonary artery is normal in caliber. Normal heart size. No significant pericardial effusion. The thoracic aorta is normal in caliber. At least mild atherosclerotic plaque of the thoracic aorta. Four-vessel coronary artery calcifications. Mediastinum/Nodes: No enlarged mediastinal, hilar, or axillary lymph nodes. Thyroid gland, trachea, and esophagus demonstrate no significant findings. Lungs/Pleura: Limited evaluation due to respiratory motion artifact. Partial collapse of bilateral lower lobes in the right middle lobe. Passive atelectasis of the right upper lobe and lingula. No focal consolidation. No pulmonary nodule. No pulmonary mass. Bilateral moderate volume pleural effusions. No pneumothorax. Upper Abdomen: No acute abnormality. Musculoskeletal: No abdominal wall hernia or abnormality. No suspicious lytic or blastic osseous lesions. No acute displaced fracture. Multilevel degenerative changes of the spine. Review of the MIP images confirms the above findings. IMPRESSION: 1. No pulmonary embolus. 2. Bilateral moderate pleural effusions. 3. Aortic Atherosclerosis (ICD10-I70.0) including four-vessel coronary calcifications. Electronically Signed   By: Iven Finn M.D.   On: 10/08/2021 21:59   CARDIAC CATHETERIZATION Conclusion      Prox RCA lesion is 100% stenosed.   Ost LM lesion is 60% stenosed.   Mid LAD lesion is 80% stenosed.   2nd Diag lesion is 85% stenosed.   1st Diag lesion is 99% stenosed.   Prox LAD lesion is 80% stenosed.   Mid Cx lesion is 70% stenosed.   The left ventricular ejection fraction is 25-35% by visual estimate.   1.  Severe multivessel disease. 2.  Severe LV dysfunction 3.  Fick cardiac  output of 5.2 L/min and Fick cardiac index of 3.4 L a minute per meter squared with a PA saturation of  68%. 4.  Thermodilution outputs done in triplicate demonstrated a cardiac output of 3 L/min and an index of 2 L/min/m; milrinone was increased to 2.5 mcg per kilogram per minute. 5.  Cardiac hemodynamics as follows:   A.  Right atrial pressure 9/7 with a mean of 5 mmHg B.  RV 38/3 with an end-diastolic pressure of 18 mmHg C.  Wedge pressure of 12/11 with a mean of 10 mmHg D.  Pulmonary artery pressure of 23/12 with a mean pressure of 17 mmHg; PAPI =9/5=1.8 E.  LVEDP of 16mmHg.   The results were reviewed with Dr. Haroldine Laws.  The patient be transferred to the to heart unit for medical optimization. No mechanical circulatory support was pursued.  Coronary Findings  Diagnostic Dominance: Right Left Main  Ost LM lesion is 60% stenosed.    Left Anterior Descending  There is moderate diffuse disease throughout the vessel.  Prox LAD lesion is 80% stenosed.  Mid LAD lesion is 80% stenosed.    First Diagonal Branch  Vessel is small in size.  1st Diag lesion is 99% stenosed.    Second Diagonal Branch  2nd Diag lesion is 85% stenosed.    Left Circumflex  Mid Cx lesion is 70% stenosed.    Right Coronary Artery  Collaterals  Dist RCA filled by collaterals from Mid Cx.    Prox RCA lesion is 100% stenosed.    Right Posterior Atrioventricular Artery  Collaterals  RPAV filled by collaterals from Dist Cx.      Intervention   No interventions have been documented.   Wall Motion              Left Heart  Left Ventricle The left ventricular ejection fraction is 25-35% by visual estimate.   Coronary Diagrams   Diagnostic Dominance: Right        Indications  Acute systolic heart failure (HCC) [I50.21 (ICD-10-CM)]   Clinical Presentation  CHF/Shock Congestive heart failure present. NYHA Class IV. Cardiogenic shock present.    DG Chest Port 1 View  Result Date: 10/08/2021 CLINICAL DATA:  Shortness of breath, chest pain EXAM: PORTABLE CHEST 1 VIEW COMPARISON:   11/08/2011 FINDINGS: Small right pleural effusion and moderate left pleural effusion. Bibasilar atelectasis or infiltrates. Heart is normal size. No effusions. IMPRESSION: Bilateral effusions, left larger than right. Bibasilar atelectasis or infiltrates. Electronically Signed   By: Rolm Baptise M.D.   On: 10/08/2021 19:40   ECHOCARDIOGRAM COMPLETE  ECHOCARDIOGRAM REPORT         Patient Name:   Brooke [name] Date of Exam: 10/09/2021  Medical Rec #:  [id number]      Height:       64.5 in  Accession #:    [id number]     Weight:       105.8 lb  Date of Birth:  Jun 26, 1961      BSA:          1.501 m  Patient Age:    30 years       BP:           123/87 mmHg  Patient Gender: F              HR:           80 bpm.  Exam Location:  Inpatient   Procedure: 2D Echo   Indications:    Acute systolic  CHF     History:        Patient has no prior history of Echocardiogram  examinations.                  Signs/Symptoms:Chest Pain; Risk Factors:Diabetes and                  Hypertension.     Sonographer:    Arlyss Gandy  Referring Phys: [id number] [place] T NIPP      Sonographer Comments: Supine.  IMPRESSIONS     1. Left ventricular ejection fraction, by estimation, is 20 to 25%. The  left ventricle has severely decreased function. The left ventricle  demonstrates global hypokinesis with regional variation, particularly  worse at the anterior apex and apex. Left  ventricular diastolic parameters are consistent with Grade II diastolic  dysfunction (pseudonormalization). Elevated left ventricular end-diastolic  pressure. The E/e' is 21.   2. Right ventricular systolic function is moderately reduced. The right  ventricular size is normal. Tricuspid regurgitation signal is inadequate  for assessing PA pressure.   3. Left atrial size was mildly dilated.   4. The mitral valve is abnormal. Trivial mitral valve regurgitation.   5. The aortic valve is tricuspid. Aortic valve regurgitation is not   visualized. Aortic valve sclerosis is present, with no evidence of aortic  valve stenosis.   6. The inferior vena cava is dilated in size with <50% respiratory  variability, suggesting right atrial pressure of 15 mmHg.   Comparison(s): No prior Echocardiogram.   FINDINGS   Left Ventricle: Left ventricular ejection fraction, by estimation, is 20  to 25%. The left ventricle has severely decreased function. The left  ventricle demonstrates global hypokinesis. The left ventricular internal  cavity size was normal in size. There  is no left ventricular hypertrophy. Left ventricular diastolic parameters  are consistent with Grade II diastolic dysfunction (pseudonormalization).  Elevated left ventricular end-diastolic pressure. The E/e' is 21.   Right Ventricle: The right ventricular size is normal. No increase in  right ventricular wall thickness. Right ventricular systolic function is  moderately reduced. Tricuspid regurgitation signal is inadequate for  assessing PA pressure.   Left Atrium: Left atrial size was mildly dilated.   Right Atrium: Right atrial size was normal in size.   Pericardium: There is no evidence of pericardial effusion.   Mitral Valve: The mitral valve is abnormal. There is mild thickening of  the anterior and posterior mitral valve leaflet(s). There is mild  calcification of the mitral valve leaflet(s). Trivial mitral valve  regurgitation.   Tricuspid Valve: The tricuspid valve is grossly normal. Tricuspid valve  regurgitation is not demonstrated.   Aortic Valve: The aortic valve is tricuspid. Aortic valve regurgitation is  not visualized. Aortic valve sclerosis is present, with no evidence of  aortic valve stenosis. Aortic valve mean gradient measures 2.0 mmHg.  Aortic valve peak gradient measures 3.1   mmHg. Aortic valve area, by VTI measures 1.65 cm.   Pulmonic Valve: The pulmonic valve was normal in structure. Pulmonic valve  regurgitation is not  visualized.   Aorta: The aortic root and ascending aorta are structurally normal, with  no evidence of dilitation.   Venous: The inferior vena cava is dilated in size with less than 50%  respiratory variability, suggesting right atrial pressure of 15 mmHg.   IAS/Shunts: No atrial level shunt detected by color flow Doppler.   Additional Comments: There is pleural effusion in both left and right  lateral regions.  LEFT VENTRICLE  PLAX 2D  LVIDd:         4.50 cm   Diastology  LVIDs:         4.10 cm   LV e' medial:    4.19 cm/s  LV PW:         0.80 cm   LV E/e' medial:  23.2  LV IVS:        0.80 cm   LV e' lateral:   5.22 cm/s  LVOT diam:     2.00 cm   LV E/e' lateral: 18.6  LV SV:         25  LV SV Index:   17  LVOT Area:     3.14 cm      RIGHT VENTRICLE            IVC  RV Basal diam:  3.30 cm    IVC diam: 2.30 cm  RV Mid diam:    3.30 cm  RV S prime:     6.60 cm/s  TAPSE (M-mode): 1.6 cm   LEFT ATRIUM             Index        RIGHT ATRIUM           Index  LA diam:        3.00 cm 2.00 cm/m   RA Area:     12.90 cm  LA Vol (A2C):   21.7 ml 14.46 ml/m  RA Volume:   30.90 ml  20.59 ml/m  LA Vol (A4C):   51.2 ml 34.12 ml/m  LA Biplane Vol: 36.7 ml 24.46 ml/m   AORTIC VALVE  AV Area (Vmax):    1.79 cm  AV Area (Vmean):   1.68 cm  AV Area (VTI):     1.65 cm  AV Vmax:           88.00 cm/s  AV Vmean:          59.200 cm/s  AV VTI:            0.153 m  AV Peak Grad:      3.1 mmHg  AV Mean Grad:      2.0 mmHg  LVOT Vmax:         50.20 cm/s  LVOT Vmean:        31.600 cm/s  LVOT VTI:          0.080 m  LVOT/AV VTI ratio: 0.53     AORTA  Ao Root diam: 3.10 cm  Ao Asc diam:  3.10 cm   MITRAL VALVE  MV Area (PHT): 5.62 cm    SHUNTS  MV Decel Time: 135 msec    Systemic VTI:  0.08 m  MV E velocity: 97.10 cm/s  Systemic Diam: 2.00 cm  MV A velocity: 74.80 cm/s  MV E/A ratio:  1.30   Lyman Bishop MD  Electronically signed by Lyman Bishop MD  Signature Date/Time:  10/09/2021/11:30:25 AM       I have independently reviewed the above radiologic studies and discussed with the patient   Recent Lab Findings: Lab Results  Component Value Date   WBC 8.5 10/10/2021   HGB 10.3 (L) 10/10/2021   HCT 32.4 (L) 10/10/2021   PLT 221 10/10/2021   GLUCOSE 321 (H) 10/10/2021   CHOL 154 10/10/2021   TRIG 97 10/10/2021   HDL 33 (L) 10/10/2021   LDLCALC 102 (H) 10/10/2021   ALT 13 10/08/2021   AST 22  10/08/2021   NA 135 10/10/2021   K 3.0 (L) 10/10/2021   CL 99 10/10/2021   CREATININE 0.82 10/10/2021   BUN 12 10/10/2021   CO2 29 10/10/2021   TSH 2.060 10/09/2021   INR 1.3 (H) 10/08/2021   HGBA1C 11.5 (H) 10/09/2021      Assessment / Plan:          I  spent {CHL ONC TIME VISIT - [id number]} counseling the patient face to face.  Antony Odea, PA-C  10/10/2021 3:15 PM

## 2021-10-11 LAB — GLUCOSE, CAPILLARY
Glucose-Capillary: 136 mg/dL — ABNORMAL HIGH (ref 70–99)
Glucose-Capillary: 143 mg/dL — ABNORMAL HIGH (ref 70–99)
Glucose-Capillary: 150 mg/dL — ABNORMAL HIGH (ref 70–99)
Glucose-Capillary: 251 mg/dL — ABNORMAL HIGH (ref 70–99)

## 2021-10-11 LAB — CBC
HCT: 30.9 % — ABNORMAL LOW (ref 36.0–46.0)
Hemoglobin: 9.7 g/dL — ABNORMAL LOW (ref 12.0–15.0)
MCH: 24.4 pg — ABNORMAL LOW (ref 26.0–34.0)
MCHC: 31.4 g/dL (ref 30.0–36.0)
MCV: 77.8 fL — ABNORMAL LOW (ref 80.0–100.0)
Platelets: 208 10*3/uL (ref 150–400)
RBC: 3.97 MIL/uL (ref 3.87–5.11)
RDW: 13.7 % (ref 11.5–15.5)
WBC: 8.6 10*3/uL (ref 4.0–10.5)
nRBC: 0 % (ref 0.0–0.2)

## 2021-10-11 LAB — URINE CULTURE: Culture: <10000 — AB

## 2021-10-11 LAB — HEPARIN LEVEL (UNFRACTIONATED): Heparin Unfractionated: 0.45 IU/mL (ref 0.30–0.70)

## 2021-10-11 LAB — BASIC METABOLIC PANEL
Anion gap: 5 (ref 5–15)
BUN: 8 mg/dL (ref 6–20)
CO2: 28 mmol/L (ref 22–32)
Calcium: 7.4 mg/dL — ABNORMAL LOW (ref 8.9–10.3)
Chloride: 101 mmol/L (ref 98–111)
Creatinine, Ser: 0.66 mg/dL (ref 0.44–1.00)
GFR, Estimated: >60 mL/min (ref >60)
Glucose, Bld: 209 mg/dL — ABNORMAL HIGH (ref 70–99)
Potassium: 4.3 mmol/L (ref 3.5–5.1)
Sodium: 134 mmol/L — ABNORMAL LOW (ref 135–145)

## 2021-10-11 LAB — COOXEMETRY PANEL
Carboxyhemoglobin: 1.2 % (ref 0.5–1.5)
Methemoglobin: 1 % (ref 0.0–1.5)
O2 Saturation: 72.7 %
Total hemoglobin: 10 g/dL — ABNORMAL LOW (ref 12.0–16.0)

## 2021-10-11 LAB — MAGNESIUM: Magnesium: 2.2 mg/dL (ref 1.7–2.4)

## 2021-10-11 MED ORDER — FUROSEMIDE 40 MG PO TABS: 40.0000 mg | ORAL_TABLET | Freq: Every day | ORAL | Status: DC

## 2021-10-11 MED ORDER — CLOPIDOGREL BISULFATE 75 MG PO TABS
75.0000 mg | ORAL_TABLET | Freq: Every day | ORAL | Status: DC
  Administered 2021-10-11 – 2021-10-17 (×7): 75 mg via ORAL
  Filled 2021-10-11 (×7): qty 1

## 2021-10-11 MED ORDER — ENOXAPARIN SODIUM 40 MG/0.4ML IJ SOSY
40.0000 mg | PREFILLED_SYRINGE | INTRAMUSCULAR | Status: DC
Start: 2021-10-11 — End: 2021-10-18
  Administered 2021-10-11 – 2021-10-17 (×7): 40 mg via SUBCUTANEOUS
  Filled 2021-10-11 (×7): qty 0.4

## 2021-10-11 MED ORDER — DIGOXIN 125 MCG PO TABS
0.1250 mg | ORAL_TABLET | Freq: Every day | ORAL | Status: DC
  Administered 2021-10-11 – 2021-10-17 (×7): 0.125 mg via ORAL
  Filled 2021-10-11 (×7): qty 1

## 2021-10-11 MED ORDER — LOSARTAN POTASSIUM 25 MG PO TABS
12.5000 mg | ORAL_TABLET | Freq: Two times a day (BID) | ORAL | Status: DC
  Administered 2021-10-11 (×2): 12.5 mg via ORAL
  Filled 2021-10-11 (×2): qty 1

## 2021-10-11 NOTE — Progress Notes (Signed)
Patient ID: Brooke [name], female   DOB: Sep 08, 1960, 61 y.o.   MRN: [id number] ?  ? ? Advanced Heart Failure Rounding Note ? ?PCP-Cardiologist: Candee Furbish, MD  ?AHF: Dr. Haroldine Laws  ? ?Subjective:   ? ?Remains on Milrinone 0.5. Co-ox 73%.  On milrinone 0.5 and NTG gtt 15.  ? ?I/Os net negative 1696 on Lasix 40 mg IV bid.   ? ?No dyspnea or chest pain today.  ? ?Creatinine stable 0.66. ? ?Swan #s (on Milrinone 0.5) ?CVP 6 ?PAP 37/15 ?Thermo CI 2.6 ?Co-ox 73% ? ? ?Objective:   ?Weight Range: ?51.2 kg ?Body mass index is 19.08 kg/m?.  ? ?Vital Signs:   ?Temp:  [98.4 ?F (36.9 ?C)-99.9 ?F (37.7 ?C)] 98.4 ?F (36.9 ?C) (03/04 0600) ?Pulse Rate:  [87-99] 94 (03/04 0600) ?Resp:  [15-30] 21 (03/04 0600) ?BP: (96-135)/(42-97) 110/63 (03/04 0600) ?SpO2:  [94 %-99 %] 94 % (03/04 0600) ?Weight:  [51.2 kg] 51.2 kg (03/04 0600) ?Last BM Date : 10/05/21 ? ?Weight change: ?Filed Weights  ? 10/08/21 1807 10/10/21 0600 10/11/21 0600  ?Weight: 48 kg 51.5 kg 51.2 kg  ? ? ?Intake/Output:  ? ?Intake/Output Summary (Last 24 hours) at 10/11/2021 1000 ?Last data filed at 10/11/2021 0600 ?Gross per 24 hour  ?Intake 940.11 ml  ?Output 2700 ml  ?Net -1759.89 ml  ?  ? ? ?Physical Exam  ?  ?CVP 6 ?General: NAD, thin ?Neck: No JVD, no thyromegaly or thyroid nodule.  ?Lungs: Clear to auscultation bilaterally with normal respiratory effort. ?CV: Nondisplaced PMI.  Heart regular S1/S2, no S3/S4, no murmur.  No peripheral edema.   ?Abdomen: Soft, nontender, no hepatosplenomegaly, no distention.  ?Skin: Intact without lesions or rashes.  ?Neurologic: Alert and oriented x 3.  ?Psych: Normal affect. ?Extremities: No clubbing or cyanosis.  ?HEENT: Normal.  ? ? ?Telemetry  ? ?NSR 90s, personally reviewed  ?Labs  ?  ?CBC ?Recent Labs  ?  10/08/21 ?1947 10/08/21 ?2305 10/10/21 ?0938 10/11/21 ?1829  ?WBC 13.3*   < > 8.5 8.6  ?NEUTROABS 11.4*  --   --   --   ?HGB 14.6   < > 10.3* 9.7*  ?HCT 45.2   < > 32.4* 30.9*  ?MCV 76.7*   < > 78.1* 77.8*  ?PLT 316   < > 221 208   ? < > = values in this interval not displayed.  ? ?Basic Metabolic Panel ?Recent Labs  ?  10/10/21 ?0358 10/10/21 ?0750 10/11/21 ?9371  ?NA 135  --  134*  ?K 3.0*  --  4.3  ?CL 99  --  101  ?CO2 29  --  28  ?GLUCOSE 321*  --  209*  ?BUN 12  --  8  ?CREATININE 0.82  --  0.66  ?CALCIUM 7.6*  --  7.4*  ?MG  --  1.5* 2.2  ? ?Liver Function Tests ?Recent Labs  ?  10/08/21 ?1947  ?AST 22  ?ALT 13  ?ALKPHOS 120  ?BILITOT 1.0  ?PROT 8.1  ?ALBUMIN 3.7  ? ?No results for input(s): LIPASE, AMYLASE in the last 72 hours. ?Cardiac Enzymes ?No results for input(s): CKTOTAL, CKMB, CKMBINDEX, TROPONINI in the last 72 hours. ? ?BNP: ?BNP (last 3 results) ?Recent Labs  ?  10/08/21 ?1947  ?BNP 1,373.8*  ? ? ?ProBNP (last 3 results) ?No results for input(s): PROBNP in the last 8760 hours. ? ? ?D-Dimer ?No results for input(s): DDIMER in the last 72 hours. ?Hemoglobin A1C ?Recent Labs  ?  10/09/21 ?6967  ?  HGBA1C 11.5*  ? ?Fasting Lipid Panel ?Recent Labs  ?  10/10/21 ?0358  ?CHOL 154  ?HDL 33*  ?LDLCALC 102*  ?TRIG 97  ?CHOLHDL 4.7  ? ?Thyroid Function Tests ?Recent Labs  ?  10/09/21 ?0056  ?TSH 2.060  ? ? ?Other results: ? ? ?Imaging  ? ? ?No results found. ? ? ?Medications:   ? ? ?Scheduled Medications: ? aspirin  81 mg Oral Daily  ? atorvastatin  80 mg Oral Daily  ? Chlorhexidine Gluconate Cloth  6 each Topical Daily  ? clopidogrel  75 mg Oral Daily  ? digoxin  0.125 mg Oral Daily  ? [START ON 10/12/2021] furosemide  40 mg Oral Daily  ? insulin aspart  0-15 Units Subcutaneous TID WC  ? insulin glargine-yfgn  8 Units Subcutaneous Daily  ? losartan  12.5 mg Oral BID  ? pantoprazole  40 mg Oral Daily  ? sodium chloride flush  3 mL Intravenous Q12H  ? sodium chloride flush  3 mL Intravenous Q12H  ? sodium chloride flush  3 mL Intravenous Q12H  ? spironolactone  12.5 mg Oral Daily  ? ? ?Infusions: ? sodium chloride 10 mL/hr at 10/11/21 0600  ? sodium chloride    ? sodium chloride 10 mL/hr at 10/11/21 0600  ? heparin 900 Units/hr (10/11/21  0600)  ? milrinone 0.5 mcg/kg/min (10/11/21 0600)  ? nitroGLYCERIN 15 mcg/min (10/11/21 0600)  ? ? ?PRN Medications: ?sodium chloride, sodium chloride, sodium chloride, acetaminophen, ALPRAZolam, alum & mag hydroxide-simeth, diazepam, ondansetron (ZOFRAN) IV, sodium chloride flush, sodium chloride flush, sodium chloride flush ? ? ? ?Patient Profile  ? ?61 y/o female w/ reported h/o DM but not on medications, chronic pelvic floor dysfunction and protein calorie malnutrition (Body mass index is 17.88 kg/m?Marland Kitchen) but no prior cardiac history. No routien preventative care. Has not seen a PCP nor been on medications for > 2 years due to issues w/ her insurance, admitted w/ new systolic heart failure and cardiogenic shock.  ?  ?Echo EF 20-25%, RV moderately reduced ? ?Assessment/Plan  ? ?1. Acute Biventricular Heart Failure>>Low Output  ?- New. Echo EF 20-25%, RV moderately reduced. Acidotic on admit, LA 4.4. Started on empiric milrinone + IV Lasix ?- ICM. LHC w/ severe diffuse MVCAD ?- RHC on Milrinone 0.25 w/ low filling pressures and low CO/CI by thermo, CO 3L/min, CI 2L/m?Marland Kitchen PAPI 1.8. Milrinone increased to 0.5.  ?- Remains on Milrinone 0.5. Co-ox 73%. CI by Thermo 2.6.  Will decrease milrinone to 0.375 today and add digoxin 0.125.  ?- CVP down to 6, good diuresis with IV Lasix yesterday.  Had 1 dose of IV Lasix today.  Stop IV Lasix, start Lasix 40 mg po daily tomorrow.  ?- Continue spironolactone 12.5 daily.  ?- Increase losartan to 12.5 mg bid (eventual transition to [place]).  ? ?2. CAD ?- diffuse MV CAD ?-  Prox RCA lesion is 100% stenosed. (RPAV filled by collaterals from Dist Cx) ? Colon Flattery LM lesion is 60% stenosed. ? - Mid LAD lesion is 80% stenosed. ? - 2nd Diag lesion is 85% stenosed. ? - 1st Diag lesion is 99% stenosed. ? - Prox LAD lesion is 80% stenosed. ? - Mid Cx lesion is 70% stenosed. ?- Not CABG candidate with poor targets, malnutrition, marked LV systolic dysfunction.  ?- Consider high risk PCI to LM and  LAD, plan for cMRI to assess anterior and apical viability on Monday.  ?- Continue to optimize HF, prior to potential intervention. ?- No  further chest pain.  Can stop IV heparin, start Lovenox prophylaxis.  ?- Wean off NTG gtt.  ?- Add Plavix.  ?- Continue ASA 81 mg daily  ?- Continue Atorva 80 mg  ?- no ? blocker w/ low output HF  ?  ?3. Type 2 Diabetes  ?- not on meds PTA, not followed by PCP   ?- Hgb A1c 11.5 ?- SSI ?- Consult DM coordinator ?- Will need to establish care w/ PCP post d/c  ?  ?4. Hypokalemia ?- K 4.3 today.  ?  ?5. ID ?- CXR + b/l atelectasis vs infiltrates, but doubt PNA (no cough)   ?- WBC 13>>8.5K, AF  ?- PCT 0.21>>0.81 ?- UA + mod leukocytes. Nitrate negative. ? ? 6. HLD, LDL goal < 70  ?- LDL 102 ?- Has been started on Atorva 80 ?- Needs f/u FLP and HFTs in 6-8 wks ? ?Mobilize.  ? ?CRITICAL CARE ?Performed by: Loralie Champagne ? ?Total critical care time: 40 minutes ? ?Critical care time was exclusive of separately billable procedures and treating other patients. ? ?Critical care was necessary to treat or prevent imminent or life-threatening deterioration. ? ?Critical care was time spent personally by me (independent of midlevel providers or residents) on the following activities: development of treatment plan with patient and/or surrogate as well as nursing, discussions with consultants, evaluation of patient's response to treatment, examination of patient, obtaining history from patient or surrogate, ordering and performing treatments and interventions, ordering and review of laboratory studies, ordering and review of radiographic studies, pulse oximetry and re-evaluation of patient's condition. ? ?Loralie Champagne, MD  ?10:00 AM ? ? ?

## 2021-10-12 LAB — CBC
HCT: 34.9 % — ABNORMAL LOW (ref 36.0–46.0)
Hemoglobin: 11.1 g/dL — ABNORMAL LOW (ref 12.0–15.0)
MCH: 24.7 pg — ABNORMAL LOW (ref 26.0–34.0)
MCHC: 31.8 g/dL (ref 30.0–36.0)
MCV: 77.7 fL — ABNORMAL LOW (ref 80.0–100.0)
Platelets: 227 10*3/uL (ref 150–400)
RBC: 4.49 MIL/uL (ref 3.87–5.11)
RDW: 13.6 % (ref 11.5–15.5)
WBC: 8.4 10*3/uL (ref 4.0–10.5)
nRBC: 0 % (ref 0.0–0.2)

## 2021-10-12 LAB — BASIC METABOLIC PANEL
Anion gap: 8 (ref 5–15)
BUN: 5 mg/dL — ABNORMAL LOW (ref 6–20)
CO2: 26 mmol/L (ref 22–32)
Calcium: 8.3 mg/dL — ABNORMAL LOW (ref 8.9–10.3)
Chloride: 103 mmol/L (ref 98–111)
Creatinine, Ser: 0.63 mg/dL (ref 0.44–1.00)
GFR, Estimated: >60 mL/min (ref >60)
Glucose, Bld: 123 mg/dL — ABNORMAL HIGH (ref 70–99)
Potassium: 4.2 mmol/L (ref 3.5–5.1)
Sodium: 137 mmol/L (ref 135–145)

## 2021-10-12 LAB — GLUCOSE, CAPILLARY
Glucose-Capillary: 79 mg/dL (ref 70–99)
Glucose-Capillary: 186 mg/dL — ABNORMAL HIGH (ref 70–99)
Glucose-Capillary: 170 mg/dL — ABNORMAL HIGH (ref 70–99)
Glucose-Capillary: 209 mg/dL — ABNORMAL HIGH (ref 70–99)

## 2021-10-12 LAB — COOXEMETRY PANEL
Carboxyhemoglobin: 1.7 % — ABNORMAL HIGH (ref 0.5–1.5)
Methemoglobin: 2.2 % — ABNORMAL HIGH (ref 0.0–1.5)
O2 Saturation: 70.2 %
Total hemoglobin: 11.5 g/dL — ABNORMAL LOW (ref 12.0–16.0)

## 2021-10-12 LAB — MAGNESIUM: Magnesium: 1.9 mg/dL (ref 1.7–2.4)

## 2021-10-12 MED ORDER — DAPAGLIFLOZIN PROPANEDIOL 10 MG PO TABS
10.0000 mg | ORAL_TABLET | Freq: Every day | ORAL | Status: DC
  Filled 2021-10-12: qty 1

## 2021-10-12 MED ORDER — FUROSEMIDE 20 MG PO TABS
20.0000 mg | ORAL_TABLET | Freq: Every day | ORAL | Status: DC
  Administered 2021-10-12 – 2021-10-13 (×2): 20 mg via ORAL
  Filled 2021-10-12 (×2): qty 1

## 2021-10-12 MED ORDER — SPIRONOLACTONE 25 MG PO TABS
25.0000 mg | ORAL_TABLET | Freq: Every day | ORAL | Status: DC
  Administered 2021-10-12 – 2021-10-17 (×6): 25 mg via ORAL
  Filled 2021-10-12 (×6): qty 1

## 2021-10-12 MED ORDER — SACUBITRIL-VALSARTAN 24-26 MG PO TABS
1.0000 | ORAL_TABLET | Freq: Two times a day (BID) | ORAL | Status: DC
  Administered 2021-10-12 (×2): 1 via ORAL
  Filled 2021-10-12 (×3): qty 1

## 2021-10-12 MED ORDER — SODIUM CHLORIDE 0.9 % IV SOLN: INTRAVENOUS | Status: DC

## 2021-10-12 MED ORDER — SODIUM CHLORIDE 0.9% IV SOLUTION: INTRAVENOUS | Status: DC

## 2021-10-12 MED ORDER — SODIUM CHLORIDE 0.9% IV SOLUTION: INTRAVENOUS | Status: DC | PRN

## 2021-10-12 NOTE — Evaluation (Signed)
Occupational Therapy Evaluation Patient Details Name: Brooke [name] MRN: [id number] DOB: [birth year] Today's Date: 10/12/2021   History of Present Illness This 61 y.o. female admitted 10/08/21 with SOB and CP. She was found to have elevated BNP, mildly elevated troponin. CT angio negative for PE.  She was placed on BiPAP and diuretics with good response. S/p right/left heart cath and coronary angiography 3/2 which revealed severe 3 vessel CAD with diffusely diseased targets. Dx:  cardiogenic shock, acute HF syndrome, CAD, 2D ECHO showed severe biventricular dysfunction LVEF 20-25%.  PMH includes;  pelvic floor dysfunction, endometriosis, HTN   Clinical Impression   Pt admitted with above. She demonstrates the below listed deficits and will benefit from continued OT to maximize safety and independence with BADLs.  Pt presents to OT with generalized weakness, and decreased activity tolerance.  She currently requires min guard assist for ADLs and +2 assist due to lines.  PTA, she lived with spouse and daughter and was fully independent with ADLs and functional mobility.  Will follow.        Recommendations for follow up therapy are one component of a multi-disciplinary discharge planning process, led by the attending physician.  Recommendations may be updated based on patient status, additional functional criteria and insurance authorization.   Follow Up Recommendations  No OT follow up (cardiac rehab)    Assistance Recommended at Discharge PRN  Patient can return home with the following Assistance with cooking/housework;Assist for transportation    Functional Status Assessment  Patient has had a recent decline in their functional status and demonstrates the ability to make significant improvements in function in a reasonable and predictable amount of time.  Equipment Recommendations  None recommended by OT    Recommendations for Other Services       Precautions / Restrictions  Precautions Precautions: Fall;Other (comment) (low fall risk) Precaution Comments: [place] Restrictions Other Position/Activity Restrictions: Limit cervical ROM and R UE shoulder flexion < 90 degrees secondary to [employer]      Mobility Bed Mobility Overal bed mobility: Needs Assistance Bed Mobility: Supine to Sit     Supine to sit: Supervision, HOB elevated, +2 for safety/equipment     General bed mobility comments: Supervision for safety and line management    Transfers Overall transfer level: Needs assistance Equipment used: None Transfers: Sit to/from Stand Sit to Stand: Min guard, +2 safety/equipment           General transfer comment: Able to come to stand without LOB, min guard for safety and line management.      Balance Overall balance assessment: Mild deficits observed, not formally tested (very mild)                                         ADL either performed or assessed with clinical judgement   ADL Overall ADL's : Needs assistance/impaired Eating/Feeding: Independent   Grooming: Wash/dry hands;Wash/dry face;Oral care;Supervision/safety;Standing   Upper Body Bathing: Set up;Supervision/ safety;Sitting   Lower Body Bathing: Sit to/from stand;Min guard   Upper Body Dressing : Supervision/safety;Set up;Sitting   Lower Body Dressing: Sit to/from stand;Min guard   Toilet Transfer: +2 for safety/equipment;Ambulation;Comfort height toilet;Min guard   Toileting- Clothing Manipulation and Hygiene: Sit to/from stand;Min guard;+2 for safety/equipment       Functional mobility during ADLs: Supervision/safety General ADL Comments: limited by lines. Does fatigue     Vision Patient Visual  Report: No change from baseline       Perception     Praxis      Pertinent Vitals/Pain Pain Assessment Pain Assessment: No/denies pain Faces Pain Scale: No hurt     Hand Dominance Right   Extremity/Trunk Assessment Upper Extremity  Assessment Upper Extremity Assessment: Generalized weakness   Lower Extremity Assessment Lower Extremity Assessment: Defer to PT evaluation   Cervical / Trunk Assessment Cervical / Trunk Assessment: Normal   Communication Communication Communication: No difficulties   Cognition Arousal/Alertness: Awake/alert Behavior During Therapy: WFL for tasks assessed/performed Overall Cognitive Status: Within Functional Limits for tasks assessed                                 General Comments: Very pleasant     General Comments  HR 107-109 with activity.  DOE 2/4 - 3/4    Exercises     Shoulder Instructions      Home Living Family/patient expects to be discharged to:: Private residence Living Arrangements: Spouse/significant other;Children Available Help at Discharge: Family;Available 24 hours/day Type of Home: House Home Access: Stairs to enter [employer] of Steps: 10-11 Entrance Stairs-Rails: Right;Left Home Layout: One level     Bathroom Shower/Tub: Tub only (claw leg tub)   Bathroom Toilet: Standard     Home Equipment: None   Additional Comments: lives with spouse and daughter      Prior Functioning/Environment Prior Level of Function : Independent/Modified Independent             Mobility Comments: Pt reports ambulates independently without AD and no h/o falls ADLs Comments: Pt drives and is active in community.  Does not currenlty work        OT Problem List: Decreased activity tolerance;Cardiopulmonary status limiting activity      OT Treatment/Interventions: Self-care/ADL training;Energy conservation;DME and/or AE instruction;Therapeutic activities;Patient/family education    OT Goals(Current goals can be found in the care plan section) Acute Rehab OT Goals Patient Stated Goal: to be able to go home and walk on trails in neighborhood OT Goal Formulation: With patient Time For Goal Achievement: 10/26/21 Potential to Achieve  Goals: Good ADL Goals Pt Will Perform Grooming: with modified independence;standing Pt Will Perform Upper Body Bathing: with modified independence;standing;sitting Pt Will Perform Lower Body Bathing: with modified independence;sit to/from stand Pt Will Perform Upper Body Dressing: with modified independence;sitting;standing Pt Will Perform Lower Body Dressing: with modified independence;sit to/from stand Pt Will Transfer to Toilet: with modified independence;ambulating Pt Will Perform Toileting - Clothing Manipulation and hygiene: with modified independence;sit to/from stand Pt Will Perform Tub/Shower Transfer: Tub transfer;with modified independence;ambulating Additional ADL Goal #1: Pt will independently incorporate energy conservation strategies during ADLs  OT Frequency: Min 2X/week    Co-evaluation PT/OT/SLP Co-Evaluation/Treatment: Yes Reason for Co-Treatment: For patient/therapist safety;To address functional/ADL transfers PT goals addressed during session: Mobility/safety with mobility;Balance OT goals addressed during session: ADL's and self-care      AM-PAC OT "6 Clicks" Daily Activity     Outcome Measure Help from another person eating meals?: None Help from another person taking care of personal grooming?: A Little Help from another person toileting, which includes using toliet, bedpan, or urinal?: A Little Help from another person bathing (including washing, rinsing, drying)?: A Little Help from another person to put on and taking off regular upper body clothing?: A Little Help from another person to put on and taking off regular lower body clothing?: A  Little 6 Click Score: 19   End of Session Nurse Communication: Mobility status  Activity Tolerance: Patient limited by fatigue Patient left: in chair;with call bell/phone within reach;with family/visitor present  OT Visit Diagnosis: Unsteadiness on feet (R26.81)                Time: 3475-8307 OT Time Calculation  (min): 17 min Charges:  OT General Charges $OT Visit: 1 Visit OT Evaluation $OT Eval Moderate Complexity: 1 Mod  Brooke [name] C., OTR/L Acute Rehabilitation Services Pager [phone] Office [phone]   Lucille Passy M 10/12/2021, 2:15 PM

## 2021-10-12 NOTE — Progress Notes (Addendum)
Patient ID: Brooke [name], female   DOB: 02/01/1961, 61 y.o.   MRN: [id number] ?  ? ? Advanced Heart Failure Rounding Note ? ?PCP-Cardiologist: Candee Furbish, MD  ?AHF: Dr. Haroldine Laws  ? ?Subjective:   ? ?Remains on Milrinone 0.375. Off NTG.  ? ?I/Os net negative 1987.    ? ?No dyspnea or chest pain today.  ? ?Creatinine stable 0.63 ? ?Swan #s (on Milrinone 0.375) ?CVP 7 ?PAP 50/28 ?Thermo CI 1.9 ?Co-ox 70% ? ? ?Objective:   ?Weight Range: ?51.2 kg ?Body mass index is 19.08 kg/m?.  ? ?Vital Signs:   ?Temp:  [98 ?F (36.7 ?C)-99.9 ?F (37.7 ?C)] 98 ?F (36.7 ?C) (03/05 2979) ?Pulse Rate:  [89-104] 104 (03/05 0700) ?Resp:  [19-29] 19 (03/05 0700) ?BP: (118-152)/(69-96) 142/91 (03/05 0700) ?SpO2:  [92 %-100 %] 96 % (03/05 0700) ?Last BM Date :  (2/26 at home) ? ?Weight change: ?Filed Weights  ? 10/08/21 1807 10/10/21 0600 10/11/21 0600  ?Weight: 48 kg 51.5 kg 51.2 kg  ? ? ?Intake/Output:  ? ?Intake/Output Summary (Last 24 hours) at 10/12/2021 1030 ?Last data filed at 10/12/2021 0600 ?Gross per 24 hour  ?Intake 1003.12 ml  ?Output 1700 ml  ?Net -696.88 ml  ?  ? ? ?Physical Exam  ?  ?CVP 7 ?General: NAD ?Neck: No JVD, no thyromegaly or thyroid nodule.  ?Lungs: Clear to auscultation bilaterally with normal respiratory effort. ?CV: Lateral PMI.  Heart regular S1/S2, no S3/S4, no murmur.  No peripheral edema.    ?Abdomen: Soft, nontender, no hepatosplenomegaly, no distention.  ?Skin: Intact without lesions or rashes.  ?Neurologic: Alert and oriented x 3.  ?Psych: Normal affect. ?Extremities: No clubbing or cyanosis.  ?HEENT: Normal.  ? ?Telemetry  ? ?NSR 90s-100s, personally reviewed  ? ?Labs  ?  ?CBC ?Recent Labs  ?  10/11/21 ?8921 10/12/21 ?0601  ?WBC 8.6 8.4  ?HGB 9.7* 11.1*  ?HCT 30.9* 34.9*  ?MCV 77.8* 77.7*  ?PLT 208 227  ? ?Basic Metabolic Panel ?Recent Labs  ?  10/11/21 ?1941 10/12/21 ?0601  ?NA 134* 137  ?K 4.3 4.2  ?CL 101 103  ?CO2 28 26  ?GLUCOSE 209* 123*  ?BUN 8 5*  ?CREATININE 0.66 0.63  ?CALCIUM 7.4* 8.3*  ?MG 2.2 1.9   ? ?Liver Function Tests ?No results for input(s): AST, ALT, ALKPHOS, BILITOT, PROT, ALBUMIN in the last 72 hours. ? ?No results for input(s): LIPASE, AMYLASE in the last 72 hours. ?Cardiac Enzymes ?No results for input(s): CKTOTAL, CKMB, CKMBINDEX, TROPONINI in the last 72 hours. ? ?BNP: ?BNP (last 3 results) ?Recent Labs  ?  10/08/21 ?1947  ?BNP 1,373.8*  ? ? ?ProBNP (last 3 results) ?No results for input(s): PROBNP in the last 8760 hours. ? ? ?D-Dimer ?No results for input(s): DDIMER in the last 72 hours. ?Hemoglobin A1C ?Recent Labs  ?  10/09/21 ?7408  ?HGBA1C 11.5*  ? ?Fasting Lipid Panel ?Recent Labs  ?  10/10/21 ?0358  ?CHOL 154  ?HDL 33*  ?LDLCALC 102*  ?TRIG 97  ?CHOLHDL 4.7  ? ?Thyroid Function Tests ?No results for input(s): TSH, T4TOTAL, T3FREE, THYROIDAB in the last 72 hours. ? ?Invalid input(s): FREET3 ? ? ?Other results: ? ? ?Imaging  ? ? ?No results found. ? ? ?Medications:   ? ? ?Scheduled Medications: ? aspirin  81 mg Oral Daily  ? atorvastatin  80 mg Oral Daily  ? Chlorhexidine Gluconate Cloth  6 each Topical Daily  ? clopidogrel  75 mg Oral Daily  ? dapagliflozin  propanediol  10 mg Oral Daily  ? digoxin  0.125 mg Oral Daily  ? enoxaparin (LOVENOX) injection  40 mg Subcutaneous Q24H  ? furosemide  20 mg Oral Daily  ? insulin aspart  0-15 Units Subcutaneous TID WC  ? insulin glargine-yfgn  8 Units Subcutaneous Daily  ? pantoprazole  40 mg Oral Daily  ? sacubitril-valsartan  1 tablet Oral BID  ? sodium chloride flush  3 mL Intravenous Q12H  ? sodium chloride flush  3 mL Intravenous Q12H  ? sodium chloride flush  3 mL Intravenous Q12H  ? spironolactone  12.5 mg Oral Daily  ? ? ?Infusions: ? sodium chloride 10 mL/hr at 10/12/21 0600  ? sodium chloride    ? sodium chloride 10 mL/hr at 10/12/21 0600  ? milrinone 0.375 mcg/kg/min (10/12/21 0600)  ? nitroGLYCERIN Stopped (10/11/21 1340)  ? ? ?PRN Medications: ?sodium chloride, sodium chloride, sodium chloride, acetaminophen, ALPRAZolam, alum & mag  hydroxide-simeth, diazepam, ondansetron (ZOFRAN) IV, sodium chloride flush, sodium chloride flush, sodium chloride flush ? ? ? ?Patient Profile  ? ?61 y/o female w/ reported h/o DM but not on medications, chronic pelvic floor dysfunction and protein calorie malnutrition (Body mass index is 17.88 kg/m?Marland Kitchen) but no prior cardiac history. No routien preventative care. Has not seen a PCP nor been on medications for > 2 years due to issues w/ her insurance, admitted w/ new systolic heart failure and cardiogenic shock.  ?  ?Echo EF 20-25%, RV moderately reduced ? ?Assessment/Plan  ? ?1. Acute Biventricular Heart Failure>>Low Output  ?- New. Echo EF 20-25%, RV moderately reduced. Acidotic on admit, LA 4.4. Started on empiric milrinone + IV Lasix ?- ICM. LHC w/ severe diffuse MVCAD ?- RHC on Milrinone 0.25 w/ low filling pressures and low CO/CI by thermo, CO 3L/min, CI 2L/m?Marland Kitchen PAPI 1.8. Milrinone increased to 0.5.  ?- Remains on Milrinone 0.375. Co-ox 70% but CI by Thermo down to 1.9.  Will continue current milrinone today, try to wean again tomorrow (slow wean). ?- CVP 7, good diuresis with IV Lasix yesterday.  Start Lasix 20 mg daily.  ?- Increase spironolactone to 25 mg daily.  ?- Stop losartan, start Entresto 24/26 bid.  ?- No Farxiga yet with high A1c.  ? ?2. CAD ?- diffuse MV CAD ?-  Prox RCA lesion is 100% stenosed. (RPAV filled by collaterals from Dist Cx) ? Colon Flattery LM lesion is 60% stenosed. ? - Mid LAD lesion is 80% stenosed. ? - 2nd Diag lesion is 85% stenosed. ? - 1st Diag lesion is 99% stenosed. ? - Prox LAD lesion is 80% stenosed. ? - Mid Cx lesion is 70% stenosed. ?- Not CABG candidate with poor targets, malnutrition, marked LV systolic dysfunction.  ?- Consider high risk PCI to LM and LAD, plan for cMRI to assess anterior and apical viability on Monday.  ?- Continue to optimize HF, prior to potential intervention. ?- No further chest pain, off NTG.   ?- Continue Plavix.  ?- Continue ASA 81 mg daily  ?- Continue  Atorva 80 mg  ?- no ? blocker w/ low output HF  ?  ?3. Type 2 Diabetes  ?- not on meds PTA, not followed by PCP   ?- Hgb A1c 11.5 ?- SSI ?- Consult DM coordinator ?- Will need to establish care w/ PCP post d/c  ?  ?4. Hypokalemia ?- K 4.2 today.  ?  ?5. ID ?- CXR + b/l atelectasis vs infiltrates, but doubt PNA (no cough)   ?-  WBC 13>>8.5K, AF  ?- PCT 0.21>>0.81 ?- UA + mod leukocytes. Nitrate negative. ? ? 6. HLD, LDL goal < 70  ?- LDL 102 ?- Has been started on Atorva 80 ?- Needs f/u FLP and HFTs in 6-8 wks ? ?Mobilize.  ? ?CRITICAL CARE ?Performed by: Loralie Champagne ? ?Total critical care time: 40 minutes ? ?Critical care time was exclusive of separately billable procedures and treating other patients. ? ?Critical care was necessary to treat or prevent imminent or life-threatening deterioration. ? ?Critical care was time spent personally by me (independent of midlevel providers or residents) on the following activities: development of treatment plan with patient and/or surrogate as well as nursing, discussions with consultants, evaluation of patient's response to treatment, examination of patient, obtaining history from patient or surrogate, ordering and performing treatments and interventions, ordering and review of laboratory studies, ordering and review of radiographic studies, pulse oximetry and re-evaluation of patient's condition. ? ?Loralie Champagne, MD  ?10:30 AM ? ? ?

## 2021-10-12 NOTE — Evaluation (Signed)
Physical Therapy Evaluation ?Patient Details ?Name: Brooke [name] ?MRN: [id number] ?DOB: 21-Oct-1960 ?Today's Date: 10/12/2021 ? ?History of Present Illness ? This 61 y.o. female admitted 10/08/21 with SOB and CP. She was found to have elevated BNP, mildly elevated troponin. CT angio negative for PE.  She was placed on BiPAP and diuretics with good response. S/p right/left heart cath and coronary angiography 3/2 which revealed severe 3 vessel CAD with diffusely diseased targets. Dx:  cardiogenic shock, acute HF syndrome, CAD, 2D ECHO showed severe biventricular dysfunction LVEF 20-25%.  PMH includes;  pelvic floor dysfunction, endometriosis, HTN ?  ?Clinical Impression ? Pt presents with condition above and deficits mentioned below, see PT Problem List. PTA, she was IND without hx of falls, living with her family in a 1-level house with 10-11 STE. Currently, pt displays some very mild balance deficits along with a slowed gait pace compared to baseline. Pt primarily limited in endurance. Pt able to perform all functional mobility without UE support with min guard-supervision for safety and line management. Pt would benefit from further acute PT services and follow-up with cardiac rehab to address her deficits.   ?   ? ?Recommendations for follow up therapy are one component of a multi-disciplinary discharge planning process, led by the attending physician.  Recommendations may be updated based on patient status, additional functional criteria and insurance authorization. ? ?Follow Up Recommendations Other (comment) (Cardiac Rehab) ? ?  ?Assistance Recommended at Discharge PRN  ?Patient can return home with the following ?   ? ?  ?Equipment Recommendations None recommended by PT  ?Recommendations for Other Services ?    ?  ?Functional Status Assessment Patient has had a recent decline in their functional status and demonstrates the ability to make significant improvements in function in a reasonable and predictable amount  of time.  ? ?  ?Precautions / Restrictions Precautions ?Precautions: Fall;Other (comment) (low fall risk) ?Precaution Comments: [place] Councilman ?Restrictions ?Other Position/Activity Restrictions: Limit cervical ROM and R UE shoulder flexion < 90 degrees secondary to [place] Councilman  ? ?  ? ?Mobility ? Bed Mobility ?Overal bed mobility: Needs Assistance ?Bed Mobility: Supine to Sit ?  ?  ?Supine to sit: Supervision, HOB elevated, +2 for safety/equipment ?  ?  ?General bed mobility comments: Supervision for safety and line management ?  ? ?Transfers ?Overall transfer level: Needs assistance ?Equipment used: None ?Transfers: Sit to/from Stand ?Sit to Stand: Min guard, +2 safety/equipment ?  ?  ?  ?  ?  ?General transfer comment: Able to come to stand without LOB, min guard for safety and line management. ?  ? ?Ambulation/Gait ?Ambulation/Gait assistance: Min guard, +2 safety/equipment ?Gait Distance (Feet): 470 Feet ?Assistive device: 1 person hand held assist, None ?Gait Pattern/deviations: Step-through pattern, Decreased stride length ?Gait velocity: reduced ?Gait velocity interpretation: 1.31 - 2.62 ft/sec, indicative of limited community ambulator ?  ?General Gait Details: Pt with decreased gait speed and slightly guarded compared to pt's reported normal. Initially provided HHA but able to quickly progress to no UE support. No LOB throughout, able to increase speed gradually some. ? ?Stairs ?  ?  ?  ?  ?  ? ?Wheelchair Mobility ?  ? ?Modified Rankin (Stroke Patients Only) ?  ? ?  ? ?Balance Overall balance assessment: Mild deficits observed, not formally tested (very mild) ?  ?  ?  ?  ?  ?  ?  ?  ?  ?  ?  ?  ?  ?  ?  ?  ?  ?  ?   ? ? ? ?  Pertinent Vitals/Pain Pain Assessment ?Pain Assessment: Faces ?Faces Pain Scale: No hurt ?Pain Intervention(s): Monitored during session  ? ? ?Home Living Family/patient expects to be discharged to:: Private residence ?Living Arrangements: Spouse/significant other;Children ?Available Help at  Discharge: Family;Available 24 hours/day ?Type of Home: House ?Home Access: Stairs to enter ?Entrance Stairs-Rails: Right;Left ?Entrance Stairs-Number of Steps: 10-11 ?  ?Home Layout: One level ?Home Equipment: None ?Additional Comments: lives with spouse and daughter  ?  ?Prior Function Prior Level of Function : Independent/Modified Independent ?  ?  ?  ?  ?  ?  ?Mobility Comments: Pt reports ambulates independently without AD and no h/o falls ?ADLs Comments: Pt drives and is active in community.  Does not currenlty work ?  ? ? ?Hand Dominance  ?   ? ?  ?Extremity/Trunk Assessment  ? Upper Extremity Assessment ?Upper Extremity Assessment: Defer to OT evaluation ?  ? ?Lower Extremity Assessment ?Lower Extremity Assessment: Overall WFL for tasks assessed ?  ? ?Cervical / Trunk Assessment ?Cervical / Trunk Assessment: Normal  ?Communication  ? Communication: No difficulties  ?Cognition Arousal/Alertness: Awake/alert ?Behavior During Therapy: [facility] for tasks assessed/performed ?Overall Cognitive Status: Within Functional Limits for tasks assessed ?  ?  ?  ?  ?  ?  ?  ?  ?  ?  ?  ?  ?  ?  ?  ?  ?General Comments: Very pleasant ?  ?  ? ?  ?General Comments General comments (skin integrity, edema, etc.): VSS on RA ? ?  ?Exercises    ? ?Assessment/Plan  ?  ?PT Assessment Patient needs continued PT services  ?PT Problem List Decreased activity tolerance;Decreased balance;Decreased mobility;Cardiopulmonary status limiting activity ? ?   ?  ?PT Treatment Interventions DME instruction;Gait training;Stair training;Functional mobility training;Therapeutic activities;Therapeutic exercise;Balance training;Neuromuscular re-education;Patient/family education   ? ?PT Goals (Current goals can be found in the Care Plan section)  ?Acute Rehab PT Goals ?Patient Stated Goal: to get better and go home to walk trails ?PT Goal Formulation: With patient/family ?Time For Goal Achievement: 10/26/21 ?Potential to Achieve Goals: Good ? ?   ?Frequency Min 2X/week ?  ? ? ?Co-evaluation PT/OT/SLP Co-Evaluation/Treatment: Yes ?Reason for Co-Treatment: For patient/therapist safety;To address functional/ADL transfers ?PT goals addressed during session: Mobility/safety with mobility;Balance ?  ?  ? ? ?  ?AM-PAC PT "6 Clicks" Mobility  ?Outcome Measure Help needed turning from your back to your side while in a flat bed without using bedrails?: A Little ?Help needed moving from lying on your back to sitting on the side of a flat bed without using bedrails?: A Little ?Help needed moving to and from a bed to a chair (including a wheelchair)?: A Little ?Help needed standing up from a chair using your arms (e.g., wheelchair or bedside chair)?: A Little ?Help needed to walk in hospital room?: A Little ?Help needed climbing 3-5 steps with a railing? : A Little ?6 Click Score: 18 ? ?  ?End of Session   ?Activity Tolerance: Patient tolerated treatment well ?Patient left: in chair;with call bell/phone within reach;with family/visitor present ?Nurse Communication: Mobility status ?PT Visit Diagnosis: Unsteadiness on feet (R26.81);Other abnormalities of gait and mobility (R26.89);Difficulty in walking, not elsewhere classified (R26.2) ?  ? ?Time: 1254-1310 ?PT Time Calculation (min) (ACUTE ONLY): 16 min ? ? ?Charges:   PT Evaluation ?$PT Eval Moderate Complexity: 1 Mod ?  ?  ?   ? ? ?Moishe Spice, PT, DPT ?Acute Rehabilitation Services  ?Pager: [phone] ?Office: [phone] ? ? ?Marygrace Drought  M Pettis ?10/12/2021, 1:52 PM ? ?

## 2021-10-13 ENCOUNTER — Other Ambulatory Visit (HOSPITAL_COMMUNITY): Payer: Self-pay

## 2021-10-13 ENCOUNTER — Encounter (HOSPITAL_COMMUNITY): Payer: Self-pay | Admitting: Student

## 2021-10-13 ENCOUNTER — Inpatient Hospital Stay: Payer: Self-pay

## 2021-10-13 LAB — CBC
HCT: 40.4 % (ref 36.0–46.0)
Hemoglobin: 12.9 g/dL (ref 12.0–15.0)
MCH: 24.2 pg — ABNORMAL LOW (ref 26.0–34.0)
MCHC: 31.9 g/dL (ref 30.0–36.0)
MCV: 75.7 fL — ABNORMAL LOW (ref 80.0–100.0)
Platelets: 264 10*3/uL (ref 150–400)
RBC: 5.34 MIL/uL — ABNORMAL HIGH (ref 3.87–5.11)
RDW: 13.7 % (ref 11.5–15.5)
WBC: 9.1 10*3/uL (ref 4.0–10.5)
nRBC: 0 % (ref 0.0–0.2)

## 2021-10-13 LAB — CULTURE, BLOOD (ROUTINE X 2)
Culture: NO GROWTH
Special Requests: ADEQUATE

## 2021-10-13 LAB — COOXEMETRY PANEL
Carboxyhemoglobin: 1.8 % — ABNORMAL HIGH (ref 0.5–1.5)
Methemoglobin: 2 % — ABNORMAL HIGH (ref 0.0–1.5)
O2 Saturation: 66.9 %
Total hemoglobin: 14.1 g/dL (ref 12.0–16.0)

## 2021-10-13 LAB — BASIC METABOLIC PANEL
Anion gap: 7 (ref 5–15)
BUN: 5 mg/dL — ABNORMAL LOW (ref 6–20)
CO2: 27 mmol/L (ref 22–32)
Calcium: 8.6 mg/dL — ABNORMAL LOW (ref 8.9–10.3)
Chloride: 100 mmol/L (ref 98–111)
Creatinine, Ser: 0.67 mg/dL (ref 0.44–1.00)
GFR, Estimated: >60 mL/min (ref >60)
Glucose, Bld: 211 mg/dL — ABNORMAL HIGH (ref 70–99)
Potassium: 4.2 mmol/L (ref 3.5–5.1)
Sodium: 134 mmol/L — ABNORMAL LOW (ref 135–145)

## 2021-10-13 LAB — GLUCOSE, CAPILLARY
Glucose-Capillary: 187 mg/dL — ABNORMAL HIGH (ref 70–99)
Glucose-Capillary: 130 mg/dL — ABNORMAL HIGH (ref 70–99)

## 2021-10-13 LAB — MAGNESIUM: Magnesium: 1.9 mg/dL (ref 1.7–2.4)

## 2021-10-13 MED ORDER — SACUBITRIL-VALSARTAN 49-51 MG PO TABS
1.0000 | ORAL_TABLET | Freq: Two times a day (BID) | ORAL | Status: DC
  Administered 2021-10-13 – 2021-10-17 (×9): 1 via ORAL
  Filled 2021-10-13 (×10): qty 1

## 2021-10-13 MED ORDER — MAGNESIUM SULFATE 2 GM/50ML IV SOLN
2.0000 g | Freq: Once | INTRAVENOUS | Status: AC
  Administered 2021-10-13: 2 g via INTRAVENOUS
  Filled 2021-10-13: qty 50

## 2021-10-13 NOTE — Progress Notes (Signed)
Inpatient Diabetes Program Recommendations ? ?AACE/ADA: New Consensus Statement on Inpatient Glycemic Control (2015) ? ?Target Ranges:  Prepandial:   less than 140 mg/dL ?     Peak postprandial:   less than 180 mg/dL (1-2 hours) ?     Critically ill patients:  140 - 180 mg/dL  ? ?Lab Results  ?Component Value Date  ? GLUCAP 187 (H) 10/13/2021  ? HGBA1C 11.5 (H) 10/09/2021  ? ? ?Review of Glycemic Control ? ?Inpatient Diabetes Program Recommendations:   ?Gave patient a glucose meter and supplies for home use. Patient wants to wait until she is feeling better for insulin teaching. ? ?Thank you, ?Nani Gasser Corianne Buccellato, RN, MSN, CDE  ?Diabetes Coordinator ?Inpatient Glycemic Control Team ?Team Pager [phone] (8am-5pm) ?10/13/2021 1:48 PM ? ? ? ? ?

## 2021-10-13 NOTE — Progress Notes (Signed)
Can ambulate with pt after Swan removed. Will f/u. ?Yves Dill CES, ACSM ?10:49 AM ?10/13/2021 ? ?

## 2021-10-13 NOTE — Progress Notes (Signed)
Peripherally Inserted Central Catheter Placement ? ?The IV Nurse has discussed with the patient and/or persons authorized to consent for the patient, the purpose of this procedure and the potential benefits and risks involved with this procedure.  The benefits include less needle sticks, lab draws from the catheter, and the patient may be discharged home with the catheter. Risks include, but not limited to, infection, bleeding, blood clot (thrombus formation), and puncture of an artery; nerve damage and irregular heartbeat and possibility to perform a PICC exchange if needed/ordered by physician.  Alternatives to this procedure were also discussed.  Bard Power PICC patient education guide, fact sheet on infection prevention and patient information card has been provided to patient /or left at bedside.   ? ?PICC Placement Documentation  ?PICC Double Lumen 15/17/61 Left Basilic 39 cm 0 cm (Active)  ?Indication for Insertion or Continuance of Line Vasoactive infusions 10/12/21 1500  ?Exposed Catheter (cm) 0 cm 10/12/21 1500  ?Site Assessment Clean, Dry, Intact 10/12/21 1500  ?Lumen #1 Status Flushed;Saline locked;Blood return noted 10/12/21 1500  ?Lumen #2 Status Flushed;Saline locked;Blood return noted 10/12/21 1500  ?Dressing Type Securing device;Transparent 10/12/21 1500  ?Dressing Status Antimicrobial disc in place 10/12/21 1500  ?Safety Lock Not Applicable 60/73/71 0626  ?Line Care Connections checked and tightened 10/12/21 1500  ?Line Adjustment (NICU/IV Team Only) No 10/12/21 1500  ?Dressing Intervention New dressing 10/12/21 1500  ?Dressing Change Due 10/20/21 10/12/21 1500  ? ? ? ? ? ?Quin Hoop ?10/13/2021, 7:32 PM ? ?

## 2021-10-13 NOTE — Progress Notes (Addendum)
Patient ID: Brooke [name], female   DOB: 1960-12-27, 61 y.o.   MRN: [id number]     Advanced Heart Failure Rounding Note  PCP-Cardiologist: Candee Furbish, MD  AHF: Dr. Haroldine Laws   Subjective:    Remains on Milrinone 0.375. Off NTG.   Co-ox 67% but CI low by Thermo and Fick, 1.9   3.4L in UOP yesterday. Wt down 5 lb in 2 days. CVP 8.  SCr/K stable. BP stable w/ Entresto addition.   OOB, sitting up in chair. No complaints. Denies CP. No dyspnea. No symptoms w/ activity. Eating breakfast.    Luiz Blare #s (on Milrinone 0.375) CVP 7 PAP 29/19 (24) Thermo CI 1.89 FICK CI 1.97  Co-ox 67%   Objective:   Weight Range: 48.8 kg Body mass index is 18.18 kg/m.   Vital Signs:   Temp:  [98 F (36.7 C)-99.9 F (37.7 C)] 99.1 F (37.3 C) (03/06 0600) Pulse Rate:  [93-106] 106 (03/06 0600) Resp:  [14-33] 21 (03/06 0600) BP: (121-150)/(81-100) 121/81 (03/06 0600) SpO2:  [93 %-100 %] 96 % (03/06 0600) Weight:  [48.8 kg] 48.8 kg (03/06 0500) Last BM Date : 10/12/21  Weight change: Filed Weights   10/10/21 0600 10/11/21 0600 10/13/21 0500  Weight: 51.5 kg 51.2 kg 48.8 kg    Intake/Output:   Intake/Output Summary (Last 24 hours) at 10/13/2021 2423 Last data filed at 10/13/2021 0600 Gross per 24 hour  Intake 1585.91 ml  Output 3360 ml  Net -1774.09 ml      Physical Exam    CVP 8 General:  Well appearing, thin middle aged F. No respiratory difficulty HEENT: normal Neck: supple. JVD 8-9 cm, + Rt IJ Swan. Carotids 2+ bilat; no bruits. No lymphadenopathy or thyromegaly appreciated. Cor: PMI nondisplaced. Regular rhythm, mildly tachy rate. No rubs, gallops or murmurs. Lungs: clear Abdomen: soft, nontender, nondistended. No hepatosplenomegaly. No bruits or masses. Good bowel sounds. Extremities: no cyanosis, clubbing, rash, edema Neuro: alert & oriented x 3, cranial nerves grossly intact. moves all 4 extremities w/o difficulty. Affect pleasant.  Telemetry   Sinus tach, low 100s,  personally reviewed   Labs    CBC Recent Labs    10/12/21 0601 10/13/21 0400  WBC 8.4 9.1  HGB 11.1* 12.9  HCT 34.9* 40.4  MCV 77.7* 75.7*  PLT 227 536   Basic Metabolic Panel Recent Labs    10/12/21 0601 10/13/21 0400  NA 137 134*  K 4.2 4.2  CL 103 100  CO2 26 27  GLUCOSE 123* 211*  BUN 5* 5*  CREATININE 0.63 0.67  CALCIUM 8.3* 8.6*  MG 1.9 1.9   Liver Function Tests No results for input(s): AST, ALT, ALKPHOS, BILITOT, PROT, ALBUMIN in the last 72 hours.  No results for input(s): LIPASE, AMYLASE in the last 72 hours. Cardiac Enzymes No results for input(s): CKTOTAL, CKMB, CKMBINDEX, TROPONINI in the last 72 hours.  BNP: BNP (last 3 results) Recent Labs    10/08/21 1947  BNP 1,373.8*    ProBNP (last 3 results) No results for input(s): PROBNP in the last 8760 hours.   D-Dimer No results for input(s): DDIMER in the last 72 hours. Hemoglobin A1C No results for input(s): HGBA1C in the last 72 hours.  Fasting Lipid Panel No results for input(s): CHOL, HDL, LDLCALC, TRIG, CHOLHDL, LDLDIRECT in the last 72 hours.  Thyroid Function Tests No results for input(s): TSH, T4TOTAL, T3FREE, THYROIDAB in the last 72 hours.  Invalid input(s): FREET3   Other results:   Imaging  No results found.   Medications:     Scheduled Medications:  aspirin  81 mg Oral Daily   atorvastatin  80 mg Oral Daily   Chlorhexidine Gluconate Cloth  6 each Topical Daily   clopidogrel  75 mg Oral Daily   digoxin  0.125 mg Oral Daily   enoxaparin (LOVENOX) injection  40 mg Subcutaneous Q24H   furosemide  20 mg Oral Daily   insulin aspart  0-15 Units Subcutaneous TID WC   insulin glargine-yfgn  8 Units Subcutaneous Daily   pantoprazole  40 mg Oral Daily   sacubitril-valsartan  1 tablet Oral BID   sodium chloride flush  3 mL Intravenous Q12H   sodium chloride flush  3 mL Intravenous Q12H   sodium chloride flush  3 mL Intravenous Q12H   spironolactone  25 mg Oral Daily     Infusions:  sodium chloride     sodium chloride 10 mL/hr at 10/13/21 0600   sodium chloride     sodium chloride 10 mL/hr at 10/13/21 0600   sodium chloride     sodium chloride     milrinone 0.375 mcg/kg/min (10/13/21 0600)   nitroGLYCERIN Stopped (10/11/21 1340)    PRN Medications: sodium chloride, sodium chloride, sodium chloride, sodium chloride, acetaminophen, ALPRAZolam, alum & mag hydroxide-simeth, diazepam, ondansetron (ZOFRAN) IV, sodium chloride flush, sodium chloride flush, sodium chloride flush    Patient Profile   61 y/o female w/ reported h/o DM but not on medications, chronic pelvic floor dysfunction and protein calorie malnutrition (Body mass index is 17.88 kg/m.) but no prior cardiac history. No routien preventative care. Has not seen a PCP nor been on medications for > 2 years due to issues w/ her insurance, admitted w/ new systolic heart failure and cardiogenic shock.    Echo EF 20-25%, RV moderately reduced  Assessment/Plan   1. Acute Biventricular Heart Failure>>Low Output  - New. Echo EF 20-25%, RV moderately reduced. Acidotic on admit, LA 4.4. Started on empiric milrinone + IV Lasix - ICM. LHC w/ severe diffuse MVCAD - RHC on Milrinone 0.25 w/ low filling pressures and low CO/CI by thermo, CO 3L/min, CI 2L/m. PAPI 1.8. Milrinone increased to 0.5.  - Remains on Milrinone 0.375. Co-ox 67% but CI by Thermo and Fick down to 1.9. Slowly wean milrinone, reduced to 0.25 today and continue titration of GDMT - Increase Entresto to 49-51 mg bid  - Continue Spironolactone 25 mg daily.  - No Farxiga yet with high A1c.  - No ? blocker w/ low output - Continue Digoxin 0.125  - CVP 8. Continue Lasix 20 mg daily    2. CAD - diffuse MV CAD -  Prox RCA lesion is 100% stenosed. (RPAV filled by collaterals from Dist Cx)  - Ost LM lesion is 60% stenosed.  - Mid LAD lesion is 80% stenosed.  - 2nd Diag lesion is 85% stenosed.  - 1st Diag lesion is 99% stenosed.  -  Prox LAD lesion is 80% stenosed.  - Mid Cx lesion is 70% stenosed. - Not CABG candidate with poor targets, malnutrition, marked LV systolic dysfunction.  - Consider high risk PCI to LM and LAD, plan for cMRI to assess anterior and apical viability today  - Continue to optimize HF, prior to potential intervention. - No further chest pain, off NTG.   - Continue Plavix.  - Continue ASA 81 mg daily  - Continue Atorva 80 mg  - no ? blocker w/ low output HF    3.  Type 2 Diabetes  - not on meds PTA, not followed by PCP   - Hgb A1c 11.5 - Insulin started  - Appreciate DM coordinator - Will need to establish care w/ PCP post d/c    4. Hypokalemia - resolved  - K 4.2 today.    5. ID - CXR + b/l atelectasis vs infiltrates, but doubt PNA (no cough)   - WBC 13>>8.5K, AF  - PCT 0.21>>0.81 - UA + mod leukocytes. Nitrate negative.   6. HLD, LDL goal < 70  - LDL 102 - Has been started on Atorva 80 - Needs f/u FLP and HFTs in 6-8 wks  Continue to mobilize w/ CR. PT/OT evaluated. No HH needs.     Lyda Jester, PA-C  7:12 AM  Patient seen with PA, agree with the above note.  Good co-ox at 67% but CI low at 1.97.  She remains on milrinone 0.375.  MAP stable with CVP 7-8.   No complaints.   General: NAD Neck: No JVD, no thyromegaly or thyroid nodule.  Lungs: Clear to auscultation bilaterally with normal respiratory effort. CV: Nondisplaced PMI.  Heart regular S1/S2, no S3/S4, no murmur.  No peripheral edema.   Abdomen: Soft, nontender, no hepatosplenomegaly, no distention.  Skin: Intact without lesions or rashes.  Neurologic: Alert and oriented x 3.  Psych: Normal affect. Extremities: No clubbing or cyanosis.  HEENT: Normal.   I will remove Luiz Blare today (needs to be out to get cardiac MRI).  Will follow co-ox/CVP off introducer.  Decrease milrinone to 0.25 and increase Entresto to 49/51 bid.  Continue spironolactone.  No SGLT2 inhibitor with elevated hgbA1c.   Will do cMRI to  assess scarring in LAD territory.  Not CABG candidate, but can consider PCI to left main/LAD.   CRITICAL CARE Performed by: Loralie Champagne  Total critical care time: 35 minutes  Critical care time was exclusive of separately billable procedures and treating other patients.  Critical care was necessary to treat or prevent imminent or life-threatening deterioration.  Critical care was time spent personally by me on the following activities: development of treatment plan with patient and/or surrogate as well as nursing, discussions with consultants, evaluation of patient's response to treatment, examination of patient, obtaining history from patient or surrogate, ordering and performing treatments and interventions, ordering and review of laboratory studies, ordering and review of radiographic studies, pulse oximetry and re-evaluation of patient's condition.   Loralie Champagne 10/13/2021 11:58 AM

## 2021-10-14 ENCOUNTER — Inpatient Hospital Stay (HOSPITAL_COMMUNITY): Payer: Medicaid Other

## 2021-10-14 DIAGNOSIS — R570 Cardiogenic shock: Secondary | ICD-10-CM

## 2021-10-14 LAB — GLUCOSE, CAPILLARY
Glucose-Capillary: 276 mg/dL — ABNORMAL HIGH (ref 70–99)
Glucose-Capillary: 173 mg/dL — ABNORMAL HIGH (ref 70–99)
Glucose-Capillary: 126 mg/dL — ABNORMAL HIGH (ref 70–99)
Glucose-Capillary: 177 mg/dL — ABNORMAL HIGH (ref 70–99)
Glucose-Capillary: 105 mg/dL — ABNORMAL HIGH (ref 70–99)
Glucose-Capillary: 126 mg/dL — ABNORMAL HIGH (ref 70–99)
Glucose-Capillary: 139 mg/dL — ABNORMAL HIGH (ref 70–99)

## 2021-10-14 LAB — CBC
HCT: 39.6 % (ref 36.0–46.0)
Hemoglobin: 13.3 g/dL (ref 12.0–15.0)
MCH: 25.2 pg — ABNORMAL LOW (ref 26.0–34.0)
MCHC: 33.6 g/dL (ref 30.0–36.0)
MCV: 75.1 fL — ABNORMAL LOW (ref 80.0–100.0)
Platelets: 265 10*3/uL (ref 150–400)
RBC: 5.27 MIL/uL — ABNORMAL HIGH (ref 3.87–5.11)
RDW: 13.7 % (ref 11.5–15.5)
WBC: 10.4 10*3/uL (ref 4.0–10.5)
nRBC: 0 % (ref 0.0–0.2)

## 2021-10-14 LAB — BASIC METABOLIC PANEL
Anion gap: 9 (ref 5–15)
BUN: 8 mg/dL (ref 6–20)
CO2: 26 mmol/L (ref 22–32)
Calcium: 8.3 mg/dL — ABNORMAL LOW (ref 8.9–10.3)
Chloride: 98 mmol/L (ref 98–111)
Creatinine, Ser: 0.71 mg/dL (ref 0.44–1.00)
GFR, Estimated: >60 mL/min (ref >60)
Glucose, Bld: 151 mg/dL — ABNORMAL HIGH (ref 70–99)
Potassium: 3.8 mmol/L (ref 3.5–5.1)
Sodium: 133 mmol/L — ABNORMAL LOW (ref 135–145)

## 2021-10-14 LAB — CULTURE, BLOOD (ROUTINE X 2)
Culture: NO GROWTH
Special Requests: ADEQUATE

## 2021-10-14 LAB — COOXEMETRY PANEL
Carboxyhemoglobin: 2.1 % — ABNORMAL HIGH (ref 0.5–1.5)
Methemoglobin: <0.7 % (ref 0.0–1.5)
O2 Saturation: 66.2 %
Total hemoglobin: 13.5 g/dL (ref 12.0–16.0)

## 2021-10-14 LAB — MAGNESIUM: Magnesium: 2 mg/dL (ref 1.7–2.4)

## 2021-10-14 MED ORDER — FUROSEMIDE 20 MG PO TABS: 20.0000 mg | ORAL_TABLET | Freq: Every day | ORAL | Status: DC

## 2021-10-14 MED ORDER — GADOBUTROL 1 MMOL/ML IV SOLN
6.0000 mL | Freq: Once | INTRAVENOUS | Status: AC | PRN
  Administered 2021-10-14: 6 mL via INTRAVENOUS

## 2021-10-14 MED ORDER — POTASSIUM CHLORIDE CRYS ER 20 MEQ PO TBCR
20.0000 meq | EXTENDED_RELEASE_TABLET | Freq: Once | ORAL | Status: AC
  Administered 2021-10-14: 20 meq via ORAL

## 2021-10-14 NOTE — Progress Notes (Addendum)
Patient ID: Brooke [name], female   DOB: [birth year], 60 y.o.   MRN: [id number] ?  ? ? Advanced Heart Failure Rounding Note ? ?PCP-Cardiologist: Candee Furbish, MD  ?AHF: Dr. Haroldine Laws  ? ?Subjective:   ? ?Swan removed yesterday. PICC placed. Milrinone reduced to 0.25. Co-ox 66% today. CVP 3 ?SCr ok, 0.71.  ? ?Remains sinus tach, low 100s.  ? ?Denies CP. No resting dyspnea but feels a "bit winded" ambulating around her room.  ? ?Getting cMRI today.  ? ? ?Objective:   ?Weight Range: ?48.8 kg ?Body mass index is 18.18 kg/m?.  ? ?Vital Signs:   ?Temp:  [77 ?F (25 ?C)-98.6 ?F (37 ?C)] 98 ?F (36.7 ?C) (03/07 0400) ?Pulse Rate:  [94-106] 106 (03/07 0800) ?Resp:  [16-31] 18 (03/07 0800) ?BP: (93-132)/(65-87) 114/87 (03/07 0800) ?SpO2:  [91 %-100 %] 98 % (03/07 0800) ?Last BM Date : 10/13/21 ? ?Weight change: ?Filed Weights  ? 10/10/21 0600 10/11/21 0600 10/13/21 0500  ?Weight: 51.5 kg 51.2 kg 48.8 kg  ? ? ?Intake/Output:  ? ?Intake/Output Summary (Last 24 hours) at 10/14/2021 0839 ?Last data filed at 10/14/2021 0800 ?Gross per 24 hour  ?Intake 524.76 ml  ?Output 800 ml  ?Net -275.24 ml  ?  ? ? ?Physical Exam  ?  ?CVP 3  ?General:  thin middle aged female, sitting up in chair. No respiratory difficulty ?HEENT: normal ?Neck: supple. no JVD. Carotids 2+ bilat; no bruits. No lymphadenopathy or thyromegaly appreciated. ?Cor: PMI nondisplaced. Regular rhythm, tachy rate. No rubs, gallops or murmurs. ?Lungs: clear ?Abdomen: soft, nontender, nondistended. No hepatosplenomegaly. No bruits or masses. Good bowel sounds. ?Extremities: no cyanosis, clubbing, rash, edema + LUE PICC  ?Neuro: alert & oriented x 3, cranial nerves grossly intact. moves all 4 extremities w/o difficulty. Affect pleasant. ? ? ?Telemetry  ? ?Sinus tach, low 100s, personally reviewed  ? ?Labs  ?  ?CBC ?Recent Labs  ?  10/13/21 ?0400 10/14/21 ?0348  ?WBC 9.1 10.4  ?HGB 12.9 13.3  ?HCT 40.4 39.6  ?MCV 75.7* 75.1*  ?PLT 264 265  ? ?Basic Metabolic Panel ?Recent Labs  ?   10/13/21 ?0400 10/14/21 ?0348  ?NA 134* 133*  ?K 4.2 3.8  ?CL 100 98  ?CO2 27 26  ?GLUCOSE 211* 151*  ?BUN 5* 8  ?CREATININE 0.67 0.71  ?CALCIUM 8.6* 8.3*  ?MG 1.9 2.0  ? ?Liver Function Tests ?No results for input(s): AST, ALT, ALKPHOS, BILITOT, PROT, ALBUMIN in the last 72 hours. ? ?No results for input(s): LIPASE, AMYLASE in the last 72 hours. ?Cardiac Enzymes ?No results for input(s): CKTOTAL, CKMB, CKMBINDEX, TROPONINI in the last 72 hours. ? ?BNP: ?BNP (last 3 results) ?Recent Labs  ?  10/08/21 ?1947  ?BNP 1,373.8*  ? ? ?ProBNP (last 3 results) ?No results for input(s): PROBNP in the last 8760 hours. ? ? ?D-Dimer ?No results for input(s): DDIMER in the last 72 hours. ?Hemoglobin A1C ?No results for input(s): HGBA1C in the last 72 hours. ? ?Fasting Lipid Panel ?No results for input(s): CHOL, HDL, LDLCALC, TRIG, CHOLHDL, LDLDIRECT in the last 72 hours. ? ?Thyroid Function Tests ?No results for input(s): TSH, T4TOTAL, T3FREE, THYROIDAB in the last 72 hours. ? ?Invalid input(s): FREET3 ? ? ?Other results: ? ? ?Imaging  ? ? ?[place] EKG SITE RITE ? ?Result Date: 10/13/2021 ?If [employer] not attached, placement could not be confirmed due to current cardiac rhythm.  ? ? ?Medications:   ? ? ?Scheduled Medications: ? aspirin  81 mg Oral  Daily  ? atorvastatin  80 mg Oral Daily  ? Chlorhexidine Gluconate Cloth  6 each Topical Daily  ? clopidogrel  75 mg Oral Daily  ? digoxin  0.125 mg Oral Daily  ? enoxaparin (LOVENOX) injection  40 mg Subcutaneous Q24H  ? furosemide  20 mg Oral Daily  ? insulin aspart  0-15 Units Subcutaneous TID WC  ? insulin glargine-yfgn  8 Units Subcutaneous Daily  ? pantoprazole  40 mg Oral Daily  ? sacubitril-valsartan  1 tablet Oral BID  ? sodium chloride flush  3 mL Intravenous Q12H  ? sodium chloride flush  3 mL Intravenous Q12H  ? sodium chloride flush  3 mL Intravenous Q12H  ? spironolactone  25 mg Oral Daily  ? ? ?Infusions: ? sodium chloride    ? sodium chloride Stopped (10/13/21 1941)  ?  sodium chloride    ? sodium chloride 10 mL/hr at 10/13/21 1200  ? sodium chloride    ? sodium chloride    ? milrinone 0.25 mcg/kg/min (10/14/21 0800)  ? nitroGLYCERIN Stopped (10/11/21 1340)  ? ? ?PRN Medications: ?sodium chloride, sodium chloride, sodium chloride, sodium chloride, acetaminophen, ALPRAZolam, alum & mag hydroxide-simeth, diazepam, ondansetron (ZOFRAN) IV, sodium chloride flush, sodium chloride flush, sodium chloride flush ? ? ? ?Patient Profile  ? ?61 y/o female w/ reported h/o DM but not on medications, chronic pelvic floor dysfunction and protein calorie malnutrition (Body mass index is 17.88 kg/m?Marland Kitchen) but no prior cardiac history. No routien preventative care. Has not seen a PCP nor been on medications for > 2 years due to issues w/ her insurance, admitted w/ new systolic heart failure and cardiogenic shock.  ?  ?Echo EF 20-25%, RV moderately reduced ? ?Assessment/Plan  ? ?1. Acute Biventricular Heart Failure>>Low Output  ?- New. Echo EF 20-25%, RV moderately reduced. Acidotic on admit, LA 4.4. Started on empiric milrinone + IV Lasix ?- ICM. LHC w/ severe diffuse MVCAD ?- RHC on Milrinone 0.25 w/ low filling pressures and low CO/CI by thermo, CO 3L/min, CI 2L/m?Marland Kitchen PAPI 1.8. Milrinone increased to 0.5.  ?- tolerating Milrinone wean, currently at 0.25. Co-ox 66%. Scr ok, 0.71 ?- reduce Milrinone to 0.125 and follow co-ox  ?- Continue Entresto 49-51 mg bid  ?- Continue Spironolactone 25 mg daily.  ?- No Wilder Glade yet with high A1c.  ?- No ? blocker w/ low output ?- Continue Digoxin 0.125  ?- CVP 3. Hold PO Lasix today ?- cMRI today  ? ? ?2. CAD ?- diffuse MV CAD ?-  Prox RCA lesion is 100% stenosed. (RPAV filled by collaterals from Dist Cx) ? Colon Flattery LM lesion is 60% stenosed. ? - Mid LAD lesion is 80% stenosed. ? - 2nd Diag lesion is 85% stenosed. ? - 1st Diag lesion is 99% stenosed. ? - Prox LAD lesion is 80% stenosed. ? - Mid Cx lesion is 70% stenosed. ?- Not CABG candidate with poor targets,  malnutrition, marked LV systolic dysfunction.  ?- Consider high risk PCI to LM and LAD, plan for cMRI to assess anterior and apical viability today  ?- Continue to optimize HF, prior to potential intervention. ?- No further chest pain, off NTG.   ?- Continue Plavix.  ?- Continue ASA 81 mg daily  ?- Continue Atorva 80 mg  ?- no ? blocker w/ low output HF  ?  ?3. Type 2 Diabetes  ?- not on meds PTA, not followed by PCP   ?- Hgb A1c 11.5 ?- Insulin started  ?- Appreciate DM  coordinator ?- Will need to establish care w/ PCP post d/c  ?  ?4. Hypokalemia ?- resolved  ?- K 3.8 today.  ?  ?5. ID ?- CXR + b/l atelectasis vs infiltrates, but doubt PNA (no cough)   ?- WBC 13>>8.5K, AF  ?- PCT 0.21>>0.81 ?- UA + mod leukocytes. Nitrate negative. ? ? 6. HLD, LDL goal < 70  ?- LDL 102 ?- Has been started on Atorva 80 ?- Needs f/u FLP and HFTs in 6-8 wks ? ?Continue to mobilize w/ CR. PT/OT evaluated. No HH needs.  ? ? ?Lyda Jester, PA-C  ?8:39 AM ? ?Patient seen and examined with the above-signed Advanced Practice Provider and/or Housestaff. I personally reviewed laboratory data, imaging studies and relevant notes. I independently examined the patient and formulated the important aspects of the plan. I have edited the note to reflect any of my changes or salient points. I have personally discussed the plan with the patient and/or family. ? ?Denies CP or SOB. Co-ox stable on milrinone 0.25. CVP low.  ? ?General:  Weak appearing. No resp difficulty ?HEENT: normal ?Neck: supple. no JVD. Carotids 2+ bilat; no bruits. No lymphadenopathy or thryomegaly appreciated. ?XBM:WUXLKGM rate & rhythm. No rubs, gallops or murmurs. ?Lungs: clear ?Abdomen: soft, nontender, nondistended. No hepatosplenomegaly. No bruits or masses. Good bowel sounds. ?Extremities: no cyanosis, clubbing, rash, edema ?Neuro: alert & orientedx3, cranial nerves grossly intact. moves all 4 extremities w/o difficulty. Affect pleasant ? ?Improving slowly. Agree with  weaning milrinone. CMRI today. Reviewed with IC team. Not candidate for PCI at this point. Continue medical therapy. Can go to [facility].  ? ?Glori Bickers, MD  ?12:15 PM ? ? ? ? ?

## 2021-10-14 NOTE — TOC CM/SW Note (Signed)
Requesting meds up from [place] at dc. Appt arranged at [facility] on 11/05/21. Appt will print on dc instructions at time of dc. Will continue to follow for dc needs. Jonnie Finner RN3 CCM, Heart Failure TOC CM [phone]  ?

## 2021-10-14 NOTE — Progress Notes (Signed)
CARDIAC REHAB PHASE I  ? ?PRE:  Rate/Rhythm: 103 ST ? ?  BP: sitting 102/64 ? ?  SaO2: 98 RA ? ?MODE:  Ambulation: 580 ft  ? ?POST:  Rate/Rhythm: 108 ST ? ?  BP: sitting 120/64 ? ?  SaO2: wouldn't register ? ?Pt stood and ambulated independently with slow pace. Increased distance and did increase pace slightly. No c/o. VSS but pulse ox would not register due to cold hands after walk. Going to MRI ?[id number]  ? ?[place], ACSM ?10/14/2021 ?11:30 AM ? ? ? ? ?

## 2021-10-15 LAB — CBC
HCT: 40.8 % (ref 36.0–46.0)
Hemoglobin: 13 g/dL (ref 12.0–15.0)
MCH: 24.1 pg — ABNORMAL LOW (ref 26.0–34.0)
MCHC: 31.9 g/dL (ref 30.0–36.0)
MCV: 75.6 fL — ABNORMAL LOW (ref 80.0–100.0)
Platelets: 311 10*3/uL (ref 150–400)
RBC: 5.4 MIL/uL — ABNORMAL HIGH (ref 3.87–5.11)
RDW: 13.9 % (ref 11.5–15.5)
WBC: 9.5 10*3/uL (ref 4.0–10.5)
nRBC: 0 % (ref 0.0–0.2)

## 2021-10-15 LAB — GLUCOSE, CAPILLARY
Glucose-Capillary: 135 mg/dL — ABNORMAL HIGH (ref 70–99)
Glucose-Capillary: 236 mg/dL — ABNORMAL HIGH (ref 70–99)
Glucose-Capillary: 196 mg/dL — ABNORMAL HIGH (ref 70–99)
Glucose-Capillary: 176 mg/dL — ABNORMAL HIGH (ref 70–99)

## 2021-10-15 LAB — BASIC METABOLIC PANEL
Anion gap: 8 (ref 5–15)
BUN: 9 mg/dL (ref 6–20)
CO2: 23 mmol/L (ref 22–32)
Calcium: 8 mg/dL — ABNORMAL LOW (ref 8.9–10.3)
Chloride: 101 mmol/L (ref 98–111)
Creatinine, Ser: 0.81 mg/dL (ref 0.44–1.00)
GFR, Estimated: >60 mL/min (ref >60)
Glucose, Bld: 136 mg/dL — ABNORMAL HIGH (ref 70–99)
Potassium: 4.2 mmol/L (ref 3.5–5.1)
Sodium: 132 mmol/L — ABNORMAL LOW (ref 135–145)

## 2021-10-15 LAB — MAGNESIUM: Magnesium: 1.9 mg/dL (ref 1.7–2.4)

## 2021-10-15 LAB — COOXEMETRY PANEL
Carboxyhemoglobin: 1.3 % (ref 0.5–1.5)
Methemoglobin: 0.9 % (ref 0.0–1.5)
O2 Saturation: 71.6 %
Total hemoglobin: 13.7 g/dL (ref 12.0–16.0)

## 2021-10-15 MED ORDER — FUROSEMIDE 20 MG PO TABS
20.0000 mg | ORAL_TABLET | Freq: Every day | ORAL | Status: DC
  Administered 2021-10-16: 10:00:00 20 mg via ORAL
  Filled 2021-10-15: qty 1

## 2021-10-15 MED ORDER — MAGNESIUM SULFATE 2 GM/50ML IV SOLN
2.0000 g | Freq: Once | INTRAVENOUS | Status: AC
  Administered 2021-10-15: 2 g via INTRAVENOUS
  Filled 2021-10-15: qty 50

## 2021-10-15 NOTE — Progress Notes (Addendum)
Patient ID: Brooke [name], female   DOB: Apr 09, 1961, 61 y.o.   MRN: [id number] ?  ? ? Advanced Heart Failure Rounding Note ? ?PCP-Cardiologist: Candee Furbish, MD  ?AHF: Dr. Haroldine Laws  ? ?Subjective:   ? ?Tolerating milrinone wean, on 0.125 today. Co-ox 72%  ? ?Scr stable, 0.81 ? ?Wt down 6 lb since admit. CVP 2  ? ?BP tolerating GDMT  ? ?cMRI completed yesterday, interpretation pending ? ?Feels well today. No further CP. No dyspnea. Ambulating w/ CR w/o difficulty.   ? ? ?Objective:   ?Weight Range: ?48.8 kg ?Body mass index is 18.18 kg/m?.  ? ?Vital Signs:   ?Temp:  [97.5 ?F (36.4 ?C)-98.3 ?F (36.8 ?C)] 98.3 ?F (36.8 ?C) (03/08 9242) ?Pulse Rate:  [93-106] 93 (03/07 1700) ?Resp:  [17-26] 20 (03/08 6834) ?BP: (98-125)/(62-87) 122/74 (03/08 1962) ?SpO2:  [90 %-98 %] 95 % (03/08 0651) ?Last BM Date : 10/14/21 ? ?Weight change: ?Filed Weights  ? 10/10/21 0600 10/11/21 0600 10/13/21 0500  ?Weight: 51.5 kg 51.2 kg 48.8 kg  ? ? ?Intake/Output:  ? ?Intake/Output Summary (Last 24 hours) at 10/15/2021 0758 ?Last data filed at 10/15/2021 0700 ?Gross per 24 hour  ?Intake 56.26 ml  ?Output 1450 ml  ?Net -1393.74 ml  ?  ? ? ?Physical Exam  ? ?CVP 2  ?General:  Well appearing. No respiratory difficulty ?HEENT: normal ?Neck: supple. no JVD. Carotids 2+ bilat; no bruits. No lymphadenopathy or thyromegaly appreciated. ?Cor: PMI nondisplaced. Regular rate & rhythm. No rubs, gallops or murmurs. ?Lungs: clear ?Abdomen: soft, nontender, nondistended. No hepatosplenomegaly. No bruits or masses. Good bowel sounds. ?Extremities: no cyanosis, clubbing, rash, edema + LUE PICC  ?Neuro: alert & oriented x 3, cranial nerves grossly intact. moves all 4 extremities w/o difficulty. Affect pleasant. ? ? ?Telemetry  ? ?NSR 97 bpm personally reviewed  ? ?Labs  ?  ?CBC ?Recent Labs  ?  10/14/21 ?0348 10/15/21 ?0359  ?WBC 10.4 9.5  ?HGB 13.3 13.0  ?HCT 39.6 40.8  ?MCV 75.1* 75.6*  ?PLT 265 311  ? ?Basic Metabolic Panel ?Recent Labs  ?  10/14/21 ?0348  10/15/21 ?0359  ?NA 133* 132*  ?K 3.8 4.2  ?CL 98 101  ?CO2 26 23  ?GLUCOSE 151* 136*  ?BUN 8 9  ?CREATININE 0.71 0.81  ?CALCIUM 8.3* 8.0*  ?MG 2.0 1.9  ? ?Liver Function Tests ?No results for input(s): AST, ALT, ALKPHOS, BILITOT, PROT, ALBUMIN in the last 72 hours. ? ?No results for input(s): LIPASE, AMYLASE in the last 72 hours. ?Cardiac Enzymes ?No results for input(s): CKTOTAL, CKMB, CKMBINDEX, TROPONINI in the last 72 hours. ? ?BNP: ?BNP (last 3 results) ?Recent Labs  ?  10/08/21 ?1947  ?BNP 1,373.8*  ? ? ?ProBNP (last 3 results) ?No results for input(s): PROBNP in the last 8760 hours. ? ? ?D-Dimer ?No results for input(s): DDIMER in the last 72 hours. ?Hemoglobin A1C ?No results for input(s): HGBA1C in the last 72 hours. ? ?Fasting Lipid Panel ?No results for input(s): CHOL, HDL, LDLCALC, TRIG, CHOLHDL, LDLDIRECT in the last 72 hours. ? ?Thyroid Function Tests ?No results for input(s): TSH, T4TOTAL, T3FREE, THYROIDAB in the last 72 hours. ? ?Invalid input(s): FREET3 ? ? ?Other results: ? ? ?Imaging  ? ? ?No results found. ? ? ?Medications:   ? ? ?Scheduled Medications: ? aspirin  81 mg Oral Daily  ? atorvastatin  80 mg Oral Daily  ? Chlorhexidine Gluconate Cloth  6 each Topical Daily  ? clopidogrel  75 mg Oral  Daily  ? digoxin  0.125 mg Oral Daily  ? enoxaparin (LOVENOX) injection  40 mg Subcutaneous Q24H  ? furosemide  20 mg Oral Daily  ? insulin aspart  0-15 Units Subcutaneous TID WC  ? insulin glargine-yfgn  8 Units Subcutaneous Daily  ? pantoprazole  40 mg Oral Daily  ? sacubitril-valsartan  1 tablet Oral BID  ? sodium chloride flush  3 mL Intravenous Q12H  ? spironolactone  25 mg Oral Daily  ? ? ?Infusions: ? sodium chloride    ? sodium chloride Stopped (10/13/21 1941)  ? sodium chloride    ? sodium chloride    ? magnesium sulfate bolus IVPB 2 g (10/15/21 0734)  ? milrinone 0.125 mcg/kg/min (10/15/21 0700)  ? ? ?PRN Medications: ?sodium chloride, sodium chloride, acetaminophen, ALPRAZolam, alum & mag  hydroxide-simeth, diazepam, ondansetron (ZOFRAN) IV, sodium chloride flush ? ? ? ?Patient Profile  ? ?61 y/o female w/ reported h/o DM but not on medications, chronic pelvic floor dysfunction and protein calorie malnutrition (Body mass index is 17.88 kg/m?Marland Kitchen) but no prior cardiac history. No routien preventative care. Has not seen a PCP nor been on medications for > 2 years due to issues w/ her insurance, admitted w/ new systolic heart failure and cardiogenic shock.  ?  ?Echo EF 20-25%, RV moderately reduced ? ?Assessment/Plan  ? ?1. Acute Biventricular Heart Failure>>Low Output  ?- New. Echo EF 20-25%, RV moderately reduced. Acidotic on admit, LA 4.4. Started on empiric milrinone + IV Lasix ?- ICM. LHC w/ severe diffuse MVCAD ?- RHC on Milrinone 0.25 w/ low filling pressures and low CO/CI by thermo, CO 3L/min, CI 2L/m?Marland Kitchen PAPI 1.8. Milrinone increased to 0.5.  ?- tolerating Milrinone wean, currently at 0.125. Co-ox 72%. Scr ok, 0.81 ?- stop milrinone today, follow co-ox x 48 hr ?- Continue Entresto 49-51 mg bid  ?- Continue Spironolactone 25 mg daily.  ?- No Wilder Glade yet with high A1c.  ?- No ? blocker w/ low output ?- Continue Digoxin 0.125  ?- CVP 2. Hold PO Lasix today ?- cMRI pending  ? ? ?2. CAD ?- diffuse MV CAD ?-  Prox RCA lesion is 100% stenosed. (RPAV filled by collaterals from Dist Cx) ? Colon Flattery LM lesion is 60% stenosed. ? - Mid LAD lesion is 80% stenosed. ? - 2nd Diag lesion is 85% stenosed. ? - 1st Diag lesion is 99% stenosed. ? - Prox LAD lesion is 80% stenosed. ? - Mid Cx lesion is 70% stenosed. ?- Not CABG candidate with poor targets, malnutrition, marked LV systolic dysfunction.  ?- Reviewed with IC team. Not candidate for PCI at this point ?- No further chest pain, off NTG.   ?- Continue Plavix.  ?- Continue ASA 81 mg daily  ?- Continue Atorva 80 mg  ?- no ? blocker w/ low output HF  ?  ?3. Type 2 Diabetes  ?- not on meds PTA, not followed by PCP   ?- Hgb A1c 11.5 ?- Insulin started  ?- Appreciate DM  coordinator ?- Will need to establish care w/ PCP post d/c  ?  ?4. Hypokalemia ?- resolved  ?- K 4.2 today.  ?  ?5. ID ?- CXR + b/l atelectasis vs infiltrates, but doubt PNA (no cough)   ?- WBC 13>>8.5K, AF  ?- PCT 0.21>>0.81 ?- UA + mod leukocytes. Nitrate negative. ? ? 6. HLD, LDL goal < 70  ?- LDL 102 ?- Has been started on Atorva 80 ?- Needs f/u FLP and HFTs in 6-8  wks ? ?Transfer to 2c today  ? ?Lyda Jester, PA-C  ?7:58 AM ? ?Patient seen and examined with the above-signed Advanced Practice Provider and/or Housestaff. I personally reviewed laboratory data, imaging studies and relevant notes. I independently examined the patient and formulated the important aspects of the plan. I have edited the note to reflect any of my changes or salient points. I have personally discussed the plan with the patient and/or family. ? ?Feels good today. Co-ox 72% on milrinone 0.125. Denies SOB, CP orthopnea or PND.  ? ?General:  Sitting up in bed  No resp difficulty ?HEENT: normal ?Neck: supple. no JVD. Carotids 2+ bilat; no bruits. No lymphadenopathy or thryomegaly appreciated. ?Cor: PMI nondisplaced. Regular rate & rhythm. No rubs, gallops or murmurs. ?Lungs: clear ?Abdomen: soft, nontender, nondistended. No hepatosplenomegaly. No bruits or masses. Good bowel sounds. ?Extremities: no cyanosis, clubbing, rash, edema ?Neuro: alert & orientedx3, cranial nerves grossly intact. moves all 4 extremities w/o difficulty. Affect pleasant ? ?Co-ox stable on low-dose milrinone. Will stop milrinone and follow CVP and co-ox. Continue to titrate GDMT as tolerated. Tranafer to SDU.  ? ?Glori Bickers, MD  ?3:20 PM ? ?

## 2021-10-15 NOTE — Progress Notes (Signed)
Inpatient Diabetes Program Recommendations ? ?AACE/ADA: New Consensus Statement on Inpatient Glycemic Control (2015) ? ?Target Ranges:  Prepandial:   less than 140 mg/dL ?     Peak postprandial:   less than 180 mg/dL (1-2 hours) ?     Critically ill patients:  140 - 180 mg/dL  ? ?Lab Results  ?Component Value Date  ? GLUCAP 236 (H) 10/15/2021  ? HGBA1C 11.5 (H) 10/09/2021  ? ? ?Review of Glycemic Control ? ?Diabetes history: DM2 ?Outpatient Diabetes medications: none ?Current orders for Inpatient glycemic control: Semglee 8 units, Novolog 0-15 units TID ? ?Inpatient Diabetes Program Recommendations:   ?Spoke with patient @ bedside. Patient and family is resting and requests insulin pen review tomorrow. ? ?Thank you, ?Nani Gasser Raizy Auzenne, RN, MSN, CDE  ?Diabetes Coordinator ?Inpatient Glycemic Control Team ?Team Pager [phone] (8am-5pm) ?10/15/2021 2:10 PM ? ? ? ? ?

## 2021-10-15 NOTE — Progress Notes (Signed)
Pt arrived from [facility], VSS, CHG complete, tele started, oriented to unit, call light within reach. Orders checked and released.  ? ?Chrisandra Carota, RN ?10/15/2021 ?6:02 PM  ?

## 2021-10-15 NOTE — Progress Notes (Signed)
Physical Therapy Treatment ?Patient Details ?Name: Brooke [name] ?MRN: [id number] ?DOB: Oct 16, 1960 ?Today's Date: 10/15/2021 ? ? ?History of Present Illness This 61 y.o. female admitted 10/08/21 with SOB and CP. She was found to have elevated BNP, mildly elevated troponin. CT angio negative for PE.  She was placed on BiPAP and diuretics with good response. S/p right/left heart cath and coronary angiography 3/2 which revealed severe 3 vessel CAD with diffusely diseased targets. Dx:  cardiogenic shock, acute HF syndrome, CAD, 2D ECHO showed severe biventricular dysfunction LVEF 20-25%.  PMH includes;  pelvic floor dysfunction, endometriosis, HTN ? ?  ?PT Comments  ? ? Focused session on dynamic gait training, with pt displaying some mild balance deficits. Pt's DGI score of 15 this date further supports that pt has balance deficits. However, pt appears to safely alter her gait speed to accommodate for balance challenges to ensure no LOB. Pt continues to ambulate at a decreased speed and displays some deficits in activity tolerance/endurance. Current recommendations remain appropriate. Will continue to follow acutely. ?  ?Recommendations for follow up therapy are one component of a multi-disciplinary discharge planning process, led by the attending physician.  Recommendations may be updated based on patient status, additional functional criteria and insurance authorization. ? ?Follow Up Recommendations ? Other (comment) (Cardiac Rehab) ?  ?  ?Assistance Recommended at Discharge PRN  ?Patient can return home with the following Help with stairs or ramp for entrance;Assistance with cooking/housework ?  ?Equipment Recommendations ? None recommended by PT  ?  ?Recommendations for Other Services   ? ? ?  ?Precautions / Restrictions Precautions ?Precautions: Fall (low fall risk) ?Restrictions ?Weight Bearing Restrictions: No  ?  ? ?Mobility ? Bed Mobility ?Overal bed mobility: Needs Assistance ?Bed Mobility: Supine to Sit, Sit to  Supine ?  ?  ?Supine to sit: Supervision, HOB elevated ?Sit to supine: Supervision, HOB elevated ?  ?General bed mobility comments: Supervision for safety and line management ?  ? ?Transfers ?Overall transfer level: Needs assistance ?Equipment used: None ?Transfers: Sit to/from Stand ?Sit to Stand: Min guard ?  ?  ?  ?  ?  ?General transfer comment: Able to come to stand without LOB, min guard for safety and line management. Extra time standing at EOB to get accomodated to being upright again ?  ? ?Ambulation/Gait ?Ambulation/Gait assistance: Min guard ?Gait Distance (Feet): 310 Feet ?Assistive device: None ?Gait Pattern/deviations: Step-through pattern, Decreased stride length ?Gait velocity: reduced ?Gait velocity interpretation: 1.31 - 2.62 ft/sec, indicative of limited community ambulator ?  ?General Gait Details: Pt with slowed gait compared to pt's reported baseline speed, but overall mostly steady. Decreases gait speed to safely acommodate for gait challenges, no LOB, min guard for safety. ? ? ?Stairs ?  ?  ?  ?  ?  ? ? ?Wheelchair Mobility ?  ? ?Modified Rankin (Stroke Patients Only) ?  ? ? ?  ?Balance Overall balance assessment: Mild deficits observed, not formally tested (very mild) ?  ?  ?  ?  ?  ?  ?  ?  ?  ?  ?  ?  ?  ?  ?  ?Standardized Balance Assessment ?Standardized Balance Assessment : Dynamic Gait Index ?  ?Dynamic Gait Index ?Level Surface: Mild Impairment ?Change in Gait Speed: Mild Impairment ?Gait with Horizontal Head Turns: Moderate Impairment ?Gait with Vertical Head Turns: Mild Impairment ?Gait and Pivot Turn: Normal ?Step Over Obstacle: Mild Impairment ?Step Around Obstacles: Mild Impairment ?Steps: Moderate Impairment (assumed, not tested) ?Total  Score: 15 ?  ? ?  ?Cognition Arousal/Alertness: Awake/alert ?Behavior During Therapy: [facility] for tasks assessed/performed ?Overall Cognitive Status: Within Functional Limits for tasks assessed ?  ?  ?  ?  ?  ?  ?  ?  ?  ?  ?  ?  ?  ?  ?  ?  ?  ?  ?   ? ?  ?Exercises Other Exercises ?Other Exercises: quick sit <> stand from EOB, 10x ? ?  ?General Comments   ?  ?  ? ?Pertinent Vitals/Pain Pain Assessment ?Pain Assessment: Faces ?Faces Pain Scale: No hurt ?Pain Intervention(s): Monitored during session  ? ? ?Home Living   ?  ?  ?  ?  ?  ?  ?  ?  ?  ?   ?  ?Prior Function    ?  ?  ?   ? ?PT Goals (current goals can now be found in the care plan section) Acute Rehab PT Goals ?Patient Stated Goal: to get sleep ?PT Goal Formulation: With patient ?Time For Goal Achievement: 10/26/21 ?Potential to Achieve Goals: Good ?Progress towards PT goals: Progressing toward goals ? ?  ?Frequency ? ? ? Min 2X/week ? ? ? ?  ?PT Plan Current plan remains appropriate  ? ? ?Co-evaluation   ?  ?  ?  ?  ? ?  ?AM-PAC PT "6 Clicks" Mobility   ?Outcome Measure ? Help needed turning from your back to your side while in a flat bed without using bedrails?: A Little ?Help needed moving from lying on your back to sitting on the side of a flat bed without using bedrails?: A Little ?Help needed moving to and from a bed to a chair (including a wheelchair)?: A Little ?Help needed standing up from a chair using your arms (e.g., wheelchair or bedside chair)?: A Little ?Help needed to walk in hospital room?: A Little ?Help needed climbing 3-5 steps with a railing? : A Little ?6 Click Score: 18 ? ?  ?End of Session   ?Activity Tolerance: Patient tolerated treatment well ?Patient left: with call bell/phone within reach;in bed ?Nurse Communication: Mobility status ?PT Visit Diagnosis: Unsteadiness on feet (R26.81);Other abnormalities of gait and mobility (R26.89);Difficulty in walking, not elsewhere classified (R26.2) ?  ? ? ?Time: 5597-4163 ?PT Time Calculation (min) (ACUTE ONLY): 20 min ? ?Charges:  $Gait Training: 8-22 mins          ?          ? ?Moishe Spice, PT, DPT ?Acute Rehabilitation Services  ?Pager: [phone] ?Office: [phone] ? ? ? ?Maretta Bees Pettis ?10/15/2021, 5:39 PM ? ?

## 2021-10-16 LAB — COOXEMETRY PANEL
Carboxyhemoglobin: 1.4 % (ref 0.5–1.5)
Carboxyhemoglobin: 1.7 % — ABNORMAL HIGH (ref 0.5–1.5)
Methemoglobin: 2.6 % — ABNORMAL HIGH (ref 0.0–1.5)
Methemoglobin: 2.2 % — ABNORMAL HIGH (ref 0.0–1.5)
O2 Saturation: 83 %
O2 Saturation: 66.6 %
Total hemoglobin: 13.7 g/dL (ref 12.0–16.0)
Total hemoglobin: 13 g/dL (ref 12.0–16.0)

## 2021-10-16 LAB — GLUCOSE, CAPILLARY
Glucose-Capillary: 316 mg/dL — ABNORMAL HIGH (ref 70–99)
Glucose-Capillary: 96 mg/dL (ref 70–99)
Glucose-Capillary: 140 mg/dL — ABNORMAL HIGH (ref 70–99)

## 2021-10-16 LAB — CBC
HCT: 39.7 % (ref 36.0–46.0)
Hemoglobin: 12.6 g/dL (ref 12.0–15.0)
MCH: 23.9 pg — ABNORMAL LOW (ref 26.0–34.0)
MCHC: 31.7 g/dL (ref 30.0–36.0)
MCV: 75.3 fL — ABNORMAL LOW (ref 80.0–100.0)
Platelets: 329 10*3/uL (ref 150–400)
RBC: 5.27 MIL/uL — ABNORMAL HIGH (ref 3.87–5.11)
RDW: 13.7 % (ref 11.5–15.5)
WBC: 10 10*3/uL (ref 4.0–10.5)
nRBC: 0 % (ref 0.0–0.2)

## 2021-10-16 LAB — MAGNESIUM: Magnesium: 2.1 mg/dL (ref 1.7–2.4)

## 2021-10-16 LAB — BASIC METABOLIC PANEL
Anion gap: 9 (ref 5–15)
BUN: 8 mg/dL (ref 6–20)
CO2: 25 mmol/L (ref 22–32)
Calcium: 8.3 mg/dL — ABNORMAL LOW (ref 8.9–10.3)
Chloride: 101 mmol/L (ref 98–111)
Creatinine, Ser: 0.88 mg/dL (ref 0.44–1.00)
GFR, Estimated: >60 mL/min (ref >60)
Glucose, Bld: 173 mg/dL — ABNORMAL HIGH (ref 70–99)
Potassium: 4.3 mmol/L (ref 3.5–5.1)
Sodium: 135 mmol/L (ref 135–145)

## 2021-10-16 LAB — DIGOXIN LEVEL: Digoxin Level: 0.4 ng/mL — ABNORMAL LOW (ref 0.8–2.0)

## 2021-10-16 MED ORDER — DAPAGLIFLOZIN PROPANEDIOL 10 MG PO TABS
10.0000 mg | ORAL_TABLET | Freq: Every day | ORAL | Status: DC
  Administered 2021-10-16 – 2021-10-17 (×2): 10 mg via ORAL
  Filled 2021-10-16 (×2): qty 1

## 2021-10-16 NOTE — Progress Notes (Signed)
Inpatient Diabetes Program Recommendations ? ?AACE/ADA: New Consensus Statement on Inpatient Glycemic Control (2015) ? ?Target Ranges:  Prepandial:   less than 140 mg/dL ?     Peak postprandial:   less than 180 mg/dL (1-2 hours) ?     Critically ill patients:  140 - 180 mg/dL  ? ?Lab Results  ?Component Value Date  ? GLUCAP 316 (H) 10/16/2021  ? HGBA1C 11.5 (H) 10/09/2021  ? ? ?Spoke with patient '@bedside'$  and reviewed insulin pen. Patient had training on insulin administration and use of insulin pen when her 59 year old daughter was diagnosed with type 1 diabetes January 2023. ?Reviewed hypoglycemia protocol and patient verbalized appropriate response. ?Patient has meter and supplies with her to take home. ?No further questions and no further needs identified. ? ?Thank you, ?Nani Gasser Derica Leiber, RN, MSN, CDE  ?Diabetes Coordinator ?Inpatient Glycemic Control Team ?Team Pager [phone] (8am-5pm) ?10/16/2021 2:34 PM ? ? ? ? ? ?

## 2021-10-16 NOTE — Progress Notes (Addendum)
Patient ID: Brooke [name], female   DOB: 01/19/1961, 61 y.o.   MRN: [id number] ?  ? ? Advanced Heart Failure Rounding Note ? ?PCP-Cardiologist: Candee Furbish, MD  ?AHF: Dr. Haroldine Laws  ? ?Subjective:   ? ?Milrinone discontinued yesterday. Co-ox 83%  ? ?Has been off lasix given low CVPs. Wt not charted today. CVP remains low ~2.  ? ?Scr ok 0.88. BP stable.  ? ?Denies CP. No dyspnea.  ? ? ?Objective:   ?Weight Range: ?48.8 kg ?Body mass index is 18.18 kg/m?.  ? ?Vital Signs:   ?Temp:  [98.1 ?F (36.7 ?C)-98.7 ?F (37.1 ?C)] 98.3 ?F (36.8 ?C) (03/09 0335) ?Pulse Rate:  [89-92] 91 (03/09 0335) ?Resp:  [16-26] 17 (03/09 0335) ?BP: (108-123)/(58-75) 123/73 (03/09 0335) ?SpO2:  [96 %-100 %] 98 % (03/09 0335) ?Last BM Date : 10/15/21 ? ?Weight change: ?Filed Weights  ? 10/10/21 0600 10/11/21 0600 10/13/21 0500  ?Weight: 51.5 kg 51.2 kg 48.8 kg  ? ? ?Intake/Output:  ? ?Intake/Output Summary (Last 24 hours) at 10/16/2021 1012 ?Last data filed at 10/16/2021 6222 ?Gross per 24 hour  ?Intake 242.07 ml  ?Output 2250 ml  ?Net -2007.93 ml  ?  ? ? ?Physical Exam  ? ?CVP 2  ?General:  thin female. No respiratory difficulty ?HEENT: normal ?Neck: supple. no JVD. Carotids 2+ bilat; no bruits. No lymphadenopathy or thyromegaly appreciated. ?Cor: PMI nondisplaced. Regular rate & rhythm. No rubs, gallops or murmurs. ?Lungs: clear ?Abdomen: soft, nontender, nondistended. No hepatosplenomegaly. No bruits or masses. Good bowel sounds. ?Extremities: no cyanosis, clubbing, rash, edema + RUE PICC  ?Neuro: alert & oriented x 3, cranial nerves grossly intact. moves all 4 extremities w/o difficulty. Affect pleasant. ? ? ? ?Telemetry  ? ?NSR 90s personally reviewed  ? ?Labs  ?  ?CBC ?Recent Labs  ?  10/15/21 ?0359 10/16/21 ?0443  ?WBC 9.5 10.0  ?HGB 13.0 12.6  ?HCT 40.8 39.7  ?MCV 75.6* 75.3*  ?PLT 311 329  ? ?Basic Metabolic Panel ?Recent Labs  ?  10/15/21 ?0359 10/16/21 ?0443  ?NA 132* 135  ?K 4.2 4.3  ?CL 101 101  ?CO2 23 25  ?GLUCOSE 136* 173*  ?BUN 9 8   ?CREATININE 0.81 0.88  ?CALCIUM 8.0* 8.3*  ?MG 1.9 2.1  ? ?Liver Function Tests ?No results for input(s): AST, ALT, ALKPHOS, BILITOT, PROT, ALBUMIN in the last 72 hours. ? ?No results for input(s): LIPASE, AMYLASE in the last 72 hours. ?Cardiac Enzymes ?No results for input(s): CKTOTAL, CKMB, CKMBINDEX, TROPONINI in the last 72 hours. ? ?BNP: ?BNP (last 3 results) ?Recent Labs  ?  10/08/21 ?1947  ?BNP 1,373.8*  ? ? ?ProBNP (last 3 results) ?No results for input(s): PROBNP in the last 8760 hours. ? ? ?D-Dimer ?No results for input(s): DDIMER in the last 72 hours. ?Hemoglobin A1C ?No results for input(s): HGBA1C in the last 72 hours. ? ?Fasting Lipid Panel ?No results for input(s): CHOL, HDL, LDLCALC, TRIG, CHOLHDL, LDLDIRECT in the last 72 hours. ? ?Thyroid Function Tests ?No results for input(s): TSH, T4TOTAL, T3FREE, THYROIDAB in the last 72 hours. ? ?Invalid input(s): FREET3 ? ? ?Other results: ? ? ?Imaging  ? ? ?No results found. ? ? ?Medications:   ? ? ?Scheduled Medications: ? aspirin  81 mg Oral Daily  ? atorvastatin  80 mg Oral Daily  ? Chlorhexidine Gluconate Cloth  6 each Topical Daily  ? clopidogrel  75 mg Oral Daily  ? digoxin  0.125 mg Oral Daily  ? enoxaparin (LOVENOX) injection  40 mg Subcutaneous Q24H  ? furosemide  20 mg Oral Daily  ? insulin aspart  0-15 Units Subcutaneous TID WC  ? insulin glargine-yfgn  8 Units Subcutaneous Daily  ? sacubitril-valsartan  1 tablet Oral BID  ? sodium chloride flush  3 mL Intravenous Q12H  ? spironolactone  25 mg Oral Daily  ? ? ?Infusions: ? sodium chloride    ? sodium chloride Stopped (10/13/21 1941)  ? sodium chloride    ? sodium chloride    ? ? ?PRN Medications: ?sodium chloride, sodium chloride, acetaminophen, ALPRAZolam, alum & mag hydroxide-simeth, diazepam, ondansetron (ZOFRAN) IV, sodium chloride flush ? ? ? ?Patient Profile  ? ?61 y/o female w/ reported h/o DM but not on medications, chronic pelvic floor dysfunction and protein calorie malnutrition (Body  mass index is 17.88 kg/m?Marland Kitchen) but no prior cardiac history. No routien preventative care. Has not seen a PCP nor been on medications for > 2 years due to issues w/ her insurance, admitted w/ new systolic heart failure and cardiogenic shock.  ?  ?Echo EF 20-25%, RV moderately reduced ? ?Assessment/Plan  ? ?1. Acute Biventricular Heart Failure>>Low Output  ?- New. Echo EF 20-25%, RV moderately reduced. Acidotic on admit, LA 4.4. Started on empiric milrinone + IV Lasix ?- ICM. LHC w/ severe diffuse MVCAD ?- RHC on Milrinone 0.25 w/ low filling pressures and low CO/CI by thermo, CO 3L/min, CI 2L/m?Marland Kitchen PAPI 1.8. Milrinone increased to 0.5.  ?- Milrinone weaned off 3/8. Co-ox 83% (repeat pending) ?- Continue Entresto 49-51 mg bid  ?- Continue Spironolactone 25 mg daily.  ?- No [place] yet with high A1c.  ?- No ? blocker w/ low output ?- Continue Digoxin 0.125  ?- CVP 2. Hold PO Lasix today ?- cMRI completed, interpretation pending  ? ? ?2. CAD ?- diffuse MV CAD ?-  Prox RCA lesion is 100% stenosed. (RPAV filled by collaterals from Dist Cx) ? Colon Flattery LM lesion is 60% stenosed. ? - Mid LAD lesion is 80% stenosed. ? - 2nd Diag lesion is 85% stenosed. ? - 1st Diag lesion is 99% stenosed. ? - Prox LAD lesion is 80% stenosed. ? - Mid Cx lesion is 70% stenosed. ?- Not CABG candidate with poor targets, malnutrition, marked LV systolic dysfunction.  ?- Reviewed with IC team. Not candidate for PCI at this point ?- No further chest pain, off NTG.   ?- Continue Plavix.  ?- Continue ASA 81 mg daily  ?- Continue Atorva 80 mg  ?- no ? blocker w/ low output HF  ?  ?3. Type 2 Diabetes  ?- not on meds PTA, not followed by PCP   ?- Hgb A1c 11.5 ?- Insulin started  ?- Appreciate DM coordinator ?- Will need to establish care w/ PCP post d/c. CM assisting   ?  ?4. Hypokalemia ?- resolved  ?- K 4.3 today.  ?  ?5. ID ?- CXR + b/l atelectasis vs infiltrates, but doubt PNA (no cough)   ?- WBC 13>>8.5K, AF  ?- PCT 0.21>>0.81 ?- UA + mod leukocytes.  Nitrate negative. ? ? 6. HLD, LDL goal < 70  ?- LDL 102 ?- Has been started on Atorva 80 ?- Needs f/u FLP and HFTs in 6-8 wks ? ?Follow co-ox off milrinone for 1 more day. If stable, plan d/c home tomorrow. Meds to [facility] pharmacy.  ? ? ? ?Lyda Jester, PA-C  ?10:12 AM ? ?Patient seen and examined with the above-signed Advanced Practice Provider and/or Housestaff. I personally reviewed laboratory  data, imaging studies and relevant notes. I independently examined the patient and formulated the important aspects of the plan. I have edited the note to reflect any of my changes or salient points. I have personally discussed the plan with the patient and/or family. ? ? ?Denies CP or SOB. Milrinone stopped yesterday. Co-ox ok. CVP remains low.  ? ?General:  Sitting up in bed. No resp difficulty ?HEENT: normal ?Neck: supple. no JVD. Carotids 2+ bilat; no bruits. No lymphadenopathy or thryomegaly appreciated. ?Cor: PMI nondisplaced. Regular rate & rhythm. No rubs, gallops or murmurs. ?Lungs: clear ?Abdomen: soft, nontender, nondistended. No hepatosplenomegaly. No bruits or masses. Good bowel sounds. ?Extremities: no cyanosis, clubbing, rash, edema ?Neuro: alert & orientedx3, cranial nerves grossly intact. moves all 4 extremities w/o difficulty. Affect pleasant ? ?Co-ox stable off milrinone. CVP remains low. No ischemic symptoms. CBGs now with improved control. Will add Farxiga 10.  ? ?Glori Bickers, MD  ?11:16 AM ? ? ? ? ?

## 2021-10-16 NOTE — Progress Notes (Signed)
CARDIAC REHAB PHASE I  ? ?PRE:  Rate/Rhythm: 92 SR ? ?  BP: sitting 100/65 ? ?  SaO2: 98 RA ? ?MODE:  Ambulation: 470 ft  ? ?POST:  Rate/Rhythm: 96 SR ? ?  BP: sitting 126/64  ? ?  SaO2: 98 RA ? ?Tolerated well, no c/o. To recliner. Discussed HF management, low sodium and DM diet, exercise, and CRPII. Pt receptive. Will refer to [place], she might be able to do virtual (no insurance). ?[id number]  ? ?[place], ACSM ?10/16/2021 ?12:17 PM ? ? ? ? ?

## 2021-10-16 NOTE — Progress Notes (Signed)
Occupational Therapy Treatment ?Patient Details ?Name: Brooke [name] ?MRN: [id number] ?DOB: 1960/08/27 ?Today's Date: 10/16/2021 ? ? ?History of present illness 61 y.o. female admitted 10/08/21 with SOB and CP. She was found to have elevated BNP, mildly elevated troponin. CT angio negative for PE.  She was placed on BiPAP and diuretics with good response. S/p right/left heart cath and coronary angiography 3/2 which revealed severe 3 vessel CAD with diffusely diseased targets. Dx:  cardiogenic shock, acute HF syndrome, CAD, 2D ECHO showed severe biventricular dysfunction LVEF 20-25%.  PMH includes;  pelvic floor dysfunction, endometriosis, HTN ?  ?OT comments ? Pt progressing towards established OT goals. Pt performing toileting, grooming, and functional mobility in hallway at Mod I level with seated rest breaks in between. Pt very motivated. Providing education on appropriate rest breaks to conserve energy during ADLs. Continue to recommend dc to home once medically stable per physician. Will continue to follow acutely.   ? ?Recommendations for follow up therapy are one component of a multi-disciplinary discharge planning process, led by the attending physician.  Recommendations may be updated based on patient status, additional functional criteria and insurance authorization. ?   ?Follow Up Recommendations ? No OT follow up  ?  ?Assistance Recommended at Discharge PRN  ?Patient can return home with the following ? Assistance with cooking/housework;Assist for transportation ?  ?Equipment Recommendations ? None recommended by OT  ?  ?Recommendations for Other Services   ? ?  ?Precautions / Restrictions Precautions ?Precautions: Fall  ? ? ?  ? ?Mobility Bed Mobility ?  ?  ?  ?  ?  ?  ?  ?General bed mobility comments: Sitting in recliner ?  ? ?Transfers ?Overall transfer level: Modified independent ?  ?  ?  ?  ?  ?  ?  ?  ?  ?  ?  ?Balance Overall balance assessment: No apparent balance deficits (not formally assessed) ?   ?  ?  ?  ?  ?  ?  ?  ?  ?  ?  ?  ?  ?  ?  ?  ?  ?  ?   ? ?ADL either performed or assessed with clinical judgement  ? ?ADL Overall ADL's : Modified independent ?  ?  ?  ?  ?  ?  ?  ?  ?  ?  ?  ?  ?  ?  ?  ?  ?  ?  ?  ?General ADL Comments: Pt performing toileting, UB bathing (while seated), grooming at sink, and mobility in hallway at Mod I level ?  ? ?Extremity/Trunk Assessment Upper Extremity Assessment ?Upper Extremity Assessment: Generalized weakness ?  ?Lower Extremity Assessment ?Lower Extremity Assessment: Defer to PT evaluation ?  ?  ?  ? ?Vision   ?  ?  ?Perception   ?  ?Praxis   ?  ? ?Cognition Arousal/Alertness: Awake/alert ?Behavior During Therapy: [facility] for tasks assessed/performed ?Overall Cognitive Status: Within Functional Limits for tasks assessed ?  ?  ?  ?  ?  ?  ?  ?  ?  ?  ?  ?  ?  ?  ?  ?  ?General Comments: Very pleasant ?  ?  ?   ?Exercises   ? ?  ?Shoulder Instructions   ? ? ?  ?General Comments HR 90s  ? ? ?Pertinent Vitals/ Pain       Pain Assessment ?Pain Assessment: No/denies pain ? ?Home Living   ?  ?  ?  ?  ?  ?  ?  ?  ?  ?  ?  ?  ?  ?  ?  ?  ?  ?  ? ?  ?  Prior Functioning/Environment    ?  ?  ?  ?   ? ?Frequency ? Min 2X/week  ? ? ? ? ?  ?Progress Toward Goals ? ?OT Goals(current goals can now be found in the care plan section) ? Progress towards OT goals: Progressing toward goals ? ?Acute Rehab OT Goals ?OT Goal Formulation: With patient ?Time For Goal Achievement: 10/26/21 ?Potential to Achieve Goals: Good ?ADL Goals ?Pt Will Perform Grooming: with modified independence;standing ?Pt Will Perform Upper Body Bathing: with modified independence;standing;sitting ?Pt Will Perform Lower Body Bathing: with modified independence;sit to/from stand ?Pt Will Perform Upper Body Dressing: with modified independence;sitting;standing ?Pt Will Perform Lower Body Dressing: with modified independence;sit to/from stand ?Pt Will Transfer to Toilet: with modified independence;ambulating ?Pt Will Perform  Toileting - Clothing Manipulation and hygiene: with modified independence;sit to/from stand ?Pt Will Perform Tub/Shower Transfer: Tub transfer;with modified independence;ambulating ?Additional ADL Goal #1: Pt will independently incorporate energy conservation strategies during ADLs  ?Plan Discharge plan remains appropriate   ? ?Co-evaluation ? ? ?   ?  ?  ?  ?  ? ?  ?AM-PAC OT "6 Clicks" Daily Activity     ?Outcome Measure ? ? Help from another person eating meals?: None ?Help from another person taking care of personal grooming?: A Little ?Help from another person toileting, which includes using toliet, bedpan, or urinal?: A Little ?Help from another person bathing (including washing, rinsing, drying)?: A Little ?Help from another person to put on and taking off regular upper body clothing?: A Little ?Help from another person to put on and taking off regular lower body clothing?: A Little ?6 Click Score: 19 ? ?  ?End of Session   ? ?OT Visit Diagnosis: Unsteadiness on feet (R26.81) ?  ?Activity Tolerance Patient tolerated treatment well ?  ?Patient Left in chair;with call bell/phone within reach ?  ?Nurse Communication Mobility status ?  ? ?   ? ?Time: 3220-2542 ?OT Time Calculation (min): 26 min ? ?Charges: OT General Charges ?$OT Visit: 1 Visit ?OT Treatments ?$Self Care/Home Management : 23-37 mins ? ?Kaydi Kley MSOT, OTR/L ?Acute Rehab ?Pager: [phone] ?Office: [phone] ? ?Heywood Footman Zephaniah Lubrano ?10/16/2021, 3:23 PM ? ? ?

## 2021-10-16 NOTE — Discharge Summary (Addendum)
Advanced Heart Failure Team  Discharge Summary   Patient ID: Brooke [name] MRN: [id number], DOB/AGE: Jan 10, 1961 61 y.o. Admit date: 10/08/2021 D/C date:     10/17/2021   Primary Discharge Diagnoses:  Acute Biventricular Heart Failure>>Low output Ischemic Cardiomyopathy  Severe Multivessel CAD, Medically Managed  Poorly Controlled Type 2DM  Hyperlipidemia  Severe protein calorie malnutrition   Hospital Course:   61 y/o woman as above with h/o DM2 (no follow up), chronic malnutrition and pelvic floor dysfunction. No known h/o heart disease.   Presented to ER with CP and progressive SOB.Marland Kitchen Found to be in cardiogenic shock with lactic acid 4.0 and biventricular dysfunction on echo EF 20-25. Hs trop 44. Ecg non-acute   Started on milrinone and taken to cath lab. Cath with severely calcified 3v CAD including 60% LM, CTO RCA, pLAD 80% with diffuse severely calcified disease throughout vessel. Hemodynamic improved on milrinone. We discussed in cath lab and mechanical support not felt to be needed. She was continued on milrinone and GDMT added. Diuresed w/ IV Lasix.   CT surgery was consulted but pt felt not a good candidate for CABG given diffusely diseased coronaries w/ no good targets and poorly controlled DM. Angiograms were reviewed w/ IC team and also felt not a candidate for PCI. cMRI with evidence of viability but no good targets for PCI. Medical therapy recommended. She was placed on ASA, Plavix and high dose statin therapy. Diabetes coordinator consulted and started insulin regimen.   After diuresis, she was weaned off milrinone. GDMT titrated. She had no further CP   F/u in HF clinic arranged at discharge. Referred to community health and wellness.  Received medications through [facility] program and HF fund.  Discharge Weight Range: 113 lb >>>103 lb Discharge Vitals: Blood pressure (!) 93/53, pulse 84, temperature 98 F (36.7 C), temperature source Oral, resp. rate 18, height 5' 4.5"  (1.638 m), weight 46.8 kg, last menstrual period 11/16/2014, SpO2 95 %.  Labs: Lab Results  Component Value Date   WBC 10.6 (H) 10/17/2021   HGB 13.3 10/17/2021   HCT 40.7 10/17/2021   MCV 75.7 (L) 10/17/2021   PLT 376 10/17/2021    Recent Labs  Lab 10/17/21 0459  NA 136  K 4.3  CL 100  CO2 27  BUN 7  CREATININE 0.84  CALCIUM 8.8*  GLUCOSE 116*   Lab Results  Component Value Date   CHOL 154 10/10/2021   HDL 33 (L) 10/10/2021   LDLCALC 102 (H) 10/10/2021   TRIG 97 10/10/2021   BNP (last 3 results) Recent Labs    10/08/21 1947  BNP 1,373.8*    ProBNP (last 3 results) No results for input(s): PROBNP in the last 8760 hours.   Diagnostic Studies/Procedures   2D Echo 10/09/21  Left ventricular ejection fraction, by estimation, is 20 to 25%. The left ventricle has severely decreased function. The left ventricle demonstrates global hypokinesis with regional variation, particularly worse at the anterior apex and apex. Left ventricular diastolic parameters are consistent with Grade II diastolic dysfunction (pseudonormalization). Elevated left ventricular end-diastolic pressure. The E/e' is 21. 1. Right ventricular systolic function is moderately reduced. The right ventricular size is normal. Tricuspid regurgitation signal is inadequate for assessing PA pressure. 2. 3. Left atrial size was mildly dilated. 4. The mitral valve is abnormal. Trivial mitral valve regurgitation. The aortic valve is tricuspid. Aortic valve regurgitation is not visualized. Aortic valve sclerosis is present, with no evidence of aortic valve stenosis. 5. The inferior vena cava  is dilated in size with <50% respiratory variability, suggesting right atrial pressure of 15 mmHg.  LHC 10/10/21    Prox RCA lesion is 100% stenosed.   Ost LM lesion is 60% stenosed.   Mid LAD lesion is 80% stenosed.   2nd Diag lesion is 85% stenosed.   1st Diag lesion is 99% stenosed.   Prox LAD lesion is 80%  stenosed.   Mid Cx lesion is 70% stenosed.   The left ventricular ejection fraction is 25-35% by visual estimate.   1.  Severe multivessel disease. 2.  Severe LV dysfunction 3.  Fick cardiac output of 5.2 L/min and Fick cardiac index of 3.4 L a minute per meter squared with a PA saturation of 68%. 4.  Thermodilution outputs done in triplicate demonstrated a cardiac output of 3 L/min and an index of 2 L/min/m; milrinone was increased to 2.5 mcg per kilogram per minute. 5.  Cardiac hemodynamics as follows:   A.  Right atrial pressure 9/7 with a mean of 5 mmHg B.  RV 38/3 with an end-diastolic pressure of 18 mmHg C.  Wedge pressure of 12/11 with a mean of 10 mmHg D.  Pulmonary artery pressure of 23/12 with a mean pressure of 17 mmHg; PAPI =9/5=1.8 E.  LVEDP of 19mHg.   cMRI, 10/14/2021: IMPRESSION: 1. Normal LV size with wall motion abnormalities as noted above, EF 24%.   2.  Normal RV size with low normal systolic function, EF 466%   3. There is coronary disease-type LGE present. However, LGE is <50% wall thickness in all area except for the apex. Suspect there would be a good chance of improved wall motion if patient were revascularized (except at the true apex).    Discharge Medications   Allergies as of 10/17/2021       Reactions   Aspirin    Intolerance (aspirin sensitive stomach)   Darvocet [propoxyphene N-acetaminophen]    Erythromycin    Gabapentin Other (See Comments)   Suicidal ideations    Ibuprofen    Metformin And Related Nausea Only        Medication List     STOP taking these medications    cholecalciferol 25 MCG (1000 UNIT) tablet Commonly known as: VITAMIN D3   diphenhydrAMINE 25 MG tablet Commonly known as: BENADRYL   estradiol 0.1 MG/GM vaginal cream Commonly known as: ESTRACE   PAMPRIN MAX PO       TAKE these medications    acetaminophen 500 MG tablet Commonly known as: TYLENOL Take 1,000 mg by mouth every 6 (six) hours as needed  for mild pain, fever or headache.   ALPRAZolam 0.25 MG tablet Commonly known as: XANAX Take 1 tablet (0.25 mg total) by mouth daily as needed for anxiety.   Aspirin Low Dose 81 MG chewable tablet Generic drug: aspirin Chew 1 tablet (81 mg total) by mouth daily. Start taking on: October 18, 2021   atorvastatin 80 MG tablet Commonly known as: LIPITOR Take 1 tablet (80 mg total) by mouth daily. Start taking on: October 18, 2021   Basaglar KwikPen 100 UNIT/ML Inject 8 Units into the skin daily.   clopidogrel 75 MG tablet Commonly known as: PLAVIX Take 1 tablet (75 mg total) by mouth daily. Start taking on: October 18, 2021   diazepam 5 MG tablet Commonly known as: VALIUM Place 1 tablet vaginally nightly as needed for muscle spasm/ pelvic pain.   digoxin 0.125 MG tablet Commonly known as: LANOXIN Take 1 tablet (0.125 mg total)  by mouth daily. Start taking on: October 18, 2021   Entresto 49-51 MG Generic drug: sacubitril-valsartan Take 1 tablet by mouth 2 (two) times daily.   Farxiga 10 MG Tabs tablet Generic drug: dapagliflozin propanediol Take 1 tablet (10 mg total) by mouth daily. Start taking on: October 18, 2021   Pentips 32G X 4 MM Misc Generic drug: Insulin Pen Needle Use as directed with insulin pen   spironolactone 25 MG tablet Commonly known as: ALDACTONE Take 1 tablet (25 mg total) by mouth daily. Start taking on: October 18, 2021   vitamin B-12 1000 MCG tablet Commonly known as: CYANOCOBALAMIN Take 3,000 mcg by mouth daily.        Disposition   The patient will be discharged in stable condition to home. Discharge Instructions     (HEART FAILURE PATIENTS) Call MD:  Anytime you have any of the following symptoms: 1) 3 pound weight gain in 24 hours or 5 pounds in 1 week 2) shortness of breath, with or without a dry hacking cough 3) swelling in the hands, feet or stomach 4) if you have to sleep on extra pillows at night in order to breathe.   Complete by: As  directed    Amb Referral to Cardiac Rehabilitation   Complete by: As directed    Diagnosis: Heart Failure (see criteria below if ordering Phase II)   Heart Failure Type: Chronic Systolic & Diastolic   After initial evaluation and assessments completed: Virtual Based Care may be provided alone or in conjunction with Phase 2 Cardiac Rehab based on patient barriers.: Yes   Diet - low sodium heart healthy   Complete by: As directed    Increase activity slowly   Complete by: As directed    PICC line removal   Complete by: As directed    STOP any activity that causes chest pain, shortness of breath, dizziness, sweating, or exessive weakness   Complete by: As directed        Follow-up Information     [facility] Follow up.   Contact information: [place] [address] [facility] Follow up on 10/31/2021.   Specialty: Cardiology Why: Advanced Heart Failure Clinic at [facility] 2:30 pm Entrance C, Garage Code 6962 Contact information: [address] [id number] [place] [phone]                  Duration of Discharge Encounter: Greater than 35 minutes   Signed, [facility], LINDSAY N  10/17/2021, 7:29 PM  Agree with above. [place] for d/c. See my rounding note for further details.   I reviewed cMRI and meds with patient prior to d/c  Glori Bickers, MD  7:46 PM

## 2021-10-17 ENCOUNTER — Other Ambulatory Visit (HOSPITAL_COMMUNITY): Payer: Self-pay

## 2021-10-17 LAB — BASIC METABOLIC PANEL
Anion gap: 9 (ref 5–15)
BUN: 7 mg/dL (ref 6–20)
CO2: 27 mmol/L (ref 22–32)
Calcium: 8.8 mg/dL — ABNORMAL LOW (ref 8.9–10.3)
Chloride: 100 mmol/L (ref 98–111)
Creatinine, Ser: 0.84 mg/dL (ref 0.44–1.00)
GFR, Estimated: >60 mL/min (ref >60)
Glucose, Bld: 116 mg/dL — ABNORMAL HIGH (ref 70–99)
Potassium: 4.3 mmol/L (ref 3.5–5.1)
Sodium: 136 mmol/L (ref 135–145)

## 2021-10-17 LAB — GLUCOSE, CAPILLARY
Glucose-Capillary: 114 mg/dL — ABNORMAL HIGH (ref 70–99)
Glucose-Capillary: 254 mg/dL — ABNORMAL HIGH (ref 70–99)
Glucose-Capillary: 169 mg/dL — ABNORMAL HIGH (ref 70–99)

## 2021-10-17 LAB — COOXEMETRY PANEL
Carboxyhemoglobin: 1.3 % (ref 0.5–1.5)
Methemoglobin: 0.7 % (ref 0.0–1.5)
O2 Saturation: 70 %
Total hemoglobin: 13 g/dL (ref 12.0–16.0)

## 2021-10-17 LAB — CBC
HCT: 40.7 % (ref 36.0–46.0)
Hemoglobin: 13.3 g/dL (ref 12.0–15.0)
MCH: 24.7 pg — ABNORMAL LOW (ref 26.0–34.0)
MCHC: 32.7 g/dL (ref 30.0–36.0)
MCV: 75.7 fL — ABNORMAL LOW (ref 80.0–100.0)
Platelets: 376 10*3/uL (ref 150–400)
RBC: 5.38 MIL/uL — ABNORMAL HIGH (ref 3.87–5.11)
RDW: 13.9 % (ref 11.5–15.5)
WBC: 10.6 10*3/uL — ABNORMAL HIGH (ref 4.0–10.5)
nRBC: 0 % (ref 0.0–0.2)

## 2021-10-17 LAB — MAGNESIUM: Magnesium: 2 mg/dL (ref 1.7–2.4)

## 2021-10-17 MED ORDER — BASAGLAR KWIKPEN 100 UNIT/ML ~~LOC~~ SOPN
8.0000 [IU] | PEN_INJECTOR | Freq: Every day | SUBCUTANEOUS | 1 refills | Status: DC
  Filled 2021-10-17: qty 3, 30d supply, fill #0
  Filled 2021-11-14: qty 3, 30d supply, fill #1
  Filled 2021-11-21: qty 3, 28d supply, fill #0

## 2021-10-17 MED ORDER — DAPAGLIFLOZIN PROPANEDIOL 10 MG PO TABS
10.0000 mg | ORAL_TABLET | Freq: Every day | ORAL | 1 refills | Status: DC
  Filled 2021-10-17: qty 30, 30d supply, fill #0

## 2021-10-17 MED ORDER — SPIRONOLACTONE 25 MG PO TABS
25.0000 mg | ORAL_TABLET | Freq: Every day | ORAL | 1 refills | Status: DC
  Filled 2021-10-17: qty 30, 30d supply, fill #0

## 2021-10-17 MED ORDER — SACUBITRIL-VALSARTAN 49-51 MG PO TABS
1.0000 | ORAL_TABLET | Freq: Two times a day (BID) | ORAL | 1 refills | Status: DC
  Filled 2021-10-17: qty 60, 30d supply, fill #0

## 2021-10-17 MED ORDER — PENTIPS 32G X 4 MM MISC: 0 refills | Status: DC

## 2021-10-17 MED ORDER — CLOPIDOGREL BISULFATE 75 MG PO TABS
75.0000 mg | ORAL_TABLET | Freq: Every day | ORAL | 1 refills | Status: DC
  Filled 2021-10-17: qty 30, 30d supply, fill #0

## 2021-10-17 MED ORDER — ASPIRIN 81 MG PO CHEW
81.0000 mg | CHEWABLE_TABLET | Freq: Every day | ORAL | 1 refills | Status: DC
  Filled 2021-10-17: qty 30, 30d supply, fill #0
  Filled 2021-11-14: qty 30, 30d supply, fill #1

## 2021-10-17 MED ORDER — DIGOXIN 125 MCG PO TABS
0.1250 mg | ORAL_TABLET | Freq: Every day | ORAL | 1 refills | Status: DC
  Filled 2021-10-17: qty 30, 30d supply, fill #0

## 2021-10-17 MED ORDER — PENTIPS 32G X 4 MM MISC
0 refills | Status: DC
  Filled 2021-10-17: qty 100, 30d supply, fill #0

## 2021-10-17 MED ORDER — ATORVASTATIN CALCIUM 80 MG PO TABS
80.0000 mg | ORAL_TABLET | Freq: Every day | ORAL | 1 refills | Status: DC
  Filled 2021-10-17: qty 30, 30d supply, fill #0

## 2021-10-17 NOTE — Progress Notes (Signed)
Inpatient Diabetes Program Recommendations ? ?AACE/ADA: New Consensus Statement on Inpatient Glycemic Control  ? ?Target Ranges:  Prepandial:   less than 140 mg/dL ?     Peak postprandial:   less than 180 mg/dL (1-2 hours) ?     Critically ill patients:  140 - 180 mg/dL  ? ? Latest Reference Range & Units 10/16/21 12:27 10/16/21 16:12 10/16/21 21:14 10/17/21 06:16  ?Glucose-Capillary 70 - 99 mg/dL 316 (H) 96 140 (H) 114 (H)  ? ?Review of Glycemic Control ? ?Diabetes history: DM2 ?Outpatient Diabetes medications: None ?Current orders for Inpatient glycemic control: Farxiga 10 mg daily, Semglee 8 units daily, Novolog 0-15 units TID with meals ? ?Inpatient Diabetes Program Recommendations:   ? ?Outpatient DM medications: At time of discharge please provide Rx for Farxiga 10 mg daily and Kara Mead (# [id number]; 1 box of 5 insulin pens) 8 units daily, and insulin pen needles [phone]). ? ?NOTE: Received page from [facility] with Pharmacy about discharge recommendations for DM control. Delsa Sale states that cost of Wilder Glade will be provided from heart failure fund. Patient will be following up with [facility]). Called [facility] pharmacy to see which insulins they have samples of and spoke with Claiborne Billings who reports that they have Basaglar, Humalog, and Humalog mix insulins samples available and pharmacy [facility] closes at 6pm today. ? ?Thanks, ?Barnie Alderman, RN, MSN, CDE ?Diabetes Coordinator ?Inpatient Diabetes Program ?[phone] (Team Pager from 8am to 5pm) ? ?

## 2021-10-17 NOTE — Plan of Care (Signed)
?  Problem: Education: ?Goal: Knowledge of General Education information will improve ?Description: Including pain rating scale, medication(s)/side effects and non-pharmacologic comfort measures ?Outcome: Progressing ?  ?Problem: Health Behavior/Discharge Planning: ?Goal: Ability to manage health-related needs will improve ?Outcome: Progressing ?  ?Problem: Clinical Measurements: ?Goal: Ability to maintain clinical measurements within normal limits will improve ?Outcome: Progressing ?Goal: Diagnostic test results will improve ?Outcome: Progressing ?Goal: Cardiovascular complication will be avoided ?Outcome: Progressing ?  ?Problem: Cardiac: ?Goal: Ability to achieve and maintain adequate cardiopulmonary perfusion will improve ?Outcome: Progressing ?  ?

## 2021-10-17 NOTE — TOC Transition Note (Signed)
Transition of Care (TOC) - CM/SW Discharge Note ?Marvetta Gibbons Therapist, sports, BSN ?Transitions of Care ?Unit 4E- RN Case Manager ?See Treatment Team for direct phone #  ? ? ?Patient Details  ?Name: Brooke [name] ?MRN: [id number] ?Date of Birth: May 11, 1961 ? ?Transition of Care (TOC) CM/SW Contact:  ?Dahlia Client, Romeo Rabon, RN ?Phone Number: ?10/17/2021, 4:02 PM ? ? ?Clinical Narrative:    ?Pt stable for transition home today, pt will be assisted with medications through HF fund and [place] [place] pharmacy to fill meds and deliver to bedside prior to discharge.  ? ?Pt has f/u appointments in place- info on AVS.  ? ? ?Final next level of care: Home/Self Care ?Barriers to Discharge: Barriers Resolved ? ? ?Patient Goals and CMS Choice ?Patient states their goals for this hospitalization and ongoing recovery are:: wants to get better ?CMS Medicare.gov Compare Post Acute Care list provided to:: Patient ?  ? ?Discharge Placement ?  ?           ? home ?  ?  ?  ? ?Discharge Plan and Services ?In-house Referral: Clinical Social Work ?Discharge Planning Services: [place] [facility], Medication Assistance, Follow-up appt scheduled, [facility] ?           ?  ?DME Agency: NA ?  ?  ?  ?HH Arranged: NA ?[place] Agency: NA ?  ?  ?  ? ?Social Determinants of Health (SDOH) Interventions ?  ? ? ?Readmission Risk Interventions ?Readmission Risk Prevention Plan 10/17/2021  ?Post Dischage Appt Complete  ?Medication Screening Complete  ?Transportation Screening Complete  ?Some recent data might be hidden  ? ? ? ? ? ?

## 2021-10-17 NOTE — Progress Notes (Addendum)
Patient ID: Livian Vanderbeck, female   DOB: [birth year], 61 y.o.   MRN: [id number] ?  ? ? Advanced Heart Failure Rounding Note ? ?PCP-Cardiologist: Candee Furbish, MD  ?AHF: Dr. Haroldine Laws  ? ?Subjective:   ? ?Milrinone discontinued 03/08.  ? ?Co-ox 70% this am. ? ?Wt down to 103 lb. CVP 1 ? ?BP stable. ? ?Feeling well today. No CP or dyspnea.   ? ?Objective:   ?Weight Range: ?46.8 kg ?Body mass index is 17.44 kg/m?.  ? ?Vital Signs:   ?Temp:  [98.2 ?F (36.8 ?C)-98.3 ?F (36.8 ?C)] 98.3 ?F (36.8 ?C) (03/10 0749) ?Pulse Rate:  [87-95] 87 (03/10 0749) ?Resp:  [16-20] 18 (03/10 0749) ?BP: (111-139)/(67-90) 111/69 (03/10 0749) ?SpO2:  [97 %-100 %] 99 % (03/10 0749) ?Weight:  [46.8 kg] 46.8 kg (03/10 0411) ?Last BM Date : 10/16/21 ? ?Weight change: ?Filed Weights  ? 10/11/21 0600 10/13/21 0500 10/17/21 0411  ?Weight: 51.2 kg 48.8 kg 46.8 kg  ? ? ?Intake/Output:  ? ?Intake/Output Summary (Last 24 hours) at 10/17/2021 1139 ?Last data filed at 10/17/2021 0418 ?Gross per 24 hour  ?Intake 180 ml  ?Output 2025 ml  ?Net -1845 ml  ?  ? ? ?Physical Exam  ? ?General:  Thin. Sitting up in bed. ?HEENT: normal ?Neck: supple. no JVD. Carotids 2+ bilat; no bruits. No lymphadenopathy or thryomegaly appreciated. ?Cor: PMI nondisplaced. Regular rate & rhythm. No rubs, gallops or murmurs. ?Lungs: clear ?Abdomen: soft, nontender, nondistended. No hepatosplenomegaly.  ?Extremities: no cyanosis, clubbing, rash, edema, + RUE PICC ?Neuro: alert & orientedx3, cranial nerves grossly intact. moves all 4 extremities w/o difficulty. Affect pleasant ? ? ? ? ?Telemetry  ? ?NSR 80s ? ?Labs  ?  ?CBC ?Recent Labs  ?  10/16/21 ?0443 10/17/21 ?0459  ?WBC 10.0 10.6*  ?HGB 12.6 13.3  ?HCT 39.7 40.7  ?MCV 75.3* 75.7*  ?PLT 329 376  ? ?Basic Metabolic Panel ?Recent Labs  ?  10/16/21 ?0443 10/17/21 ?0459  ?NA 135 136  ?K 4.3 4.3  ?CL 101 100  ?CO2 25 27  ?GLUCOSE 173* 116*  ?BUN 8 7  ?CREATININE 0.88 0.84  ?CALCIUM 8.3* 8.8*  ?MG 2.1 2.0  ? ?Liver Function Tests ?No  results for input(s): AST, ALT, ALKPHOS, BILITOT, PROT, ALBUMIN in the last 72 hours. ? ?No results for input(s): LIPASE, AMYLASE in the last 72 hours. ?Cardiac Enzymes ?No results for input(s): CKTOTAL, CKMB, CKMBINDEX, TROPONINI in the last 72 hours. ? ?BNP: ?BNP (last 3 results) ?Recent Labs  ?  10/08/21 ?1947  ?BNP 1,373.8*  ? ? ?ProBNP (last 3 results) ?No results for input(s): PROBNP in the last 8760 hours. ? ? ?D-Dimer ?No results for input(s): DDIMER in the last 72 hours. ?Hemoglobin A1C ?No results for input(s): HGBA1C in the last 72 hours. ? ?Fasting Lipid Panel ?No results for input(s): CHOL, HDL, LDLCALC, TRIG, CHOLHDL, LDLDIRECT in the last 72 hours. ? ?Thyroid Function Tests ?No results for input(s): TSH, T4TOTAL, T3FREE, THYROIDAB in the last 72 hours. ? ?Invalid input(s): FREET3 ? ? ?Other results: ? ? ?Imaging  ? ? ?No results found. ? ? ?Medications:   ? ? ?Scheduled Medications: ? aspirin  81 mg Oral Daily  ? atorvastatin  80 mg Oral Daily  ? Chlorhexidine Gluconate Cloth  6 each Topical Daily  ? clopidogrel  75 mg Oral Daily  ? dapagliflozin propanediol  10 mg Oral Daily  ? digoxin  0.125 mg Oral Daily  ? enoxaparin (LOVENOX) injection  40 mg Subcutaneous  Q24H  ? insulin aspart  0-15 Units Subcutaneous TID WC  ? insulin glargine-yfgn  8 Units Subcutaneous Daily  ? sacubitril-valsartan  1 tablet Oral BID  ? sodium chloride flush  3 mL Intravenous Q12H  ? spironolactone  25 mg Oral Daily  ? ? ?Infusions: ? sodium chloride    ? sodium chloride Stopped (10/13/21 1941)  ? sodium chloride    ? sodium chloride    ? ? ?PRN Medications: ?sodium chloride, sodium chloride, acetaminophen, ALPRAZolam, alum & mag hydroxide-simeth, diazepam, ondansetron (ZOFRAN) IV, sodium chloride flush ? ? ? ?Patient Profile  ? ?61 y/o female w/ reported h/o DM but not on medications, chronic pelvic floor dysfunction and protein calorie malnutrition (Body mass index is 17.88 kg/m?Marland Kitchen) but no prior cardiac history. No routien  preventative care. Has not seen a PCP nor been on medications for > 2 years due to issues w/ her insurance, admitted w/ new systolic heart failure and cardiogenic shock.  ?  ?Echo EF 20-25%, RV moderately reduced ? ?Assessment/Plan  ? ?1. Acute Biventricular Heart Failure>>Low Output  ?- New. Echo EF 20-25%, RV moderately reduced. Acidotic on admit, LA 4.4. Started on empiric milrinone + IV Lasix ?- ICM. LHC w/ severe diffuse MVCAD ?- RHC on Milrinone 0.25 w/ low filling pressures and low CO/CI by thermo, CO 3L/min, CI 2L/m?Marland Kitchen PAPI 1.8. Milrinone increased to 0.5.  ?- Milrinone weaned off 3/8. Co-ox stable at 70% ?- Continue Entresto 49-51 mg bid  ?- Continue Spironolactone 25 mg daily.  ?- Continue Farxiga 10 mg daily.   ?- No ? blocker w/ low output ?- Continue Digoxin 0.125  ?- CVP 1. No diuretics today. ?- cMRI with EF 24%, RVEF 48%, coronary disease type LGE present, LGE < 50% wall thickness in all areas except apex.  ? ?2. CAD ?- diffuse MV CAD ?-  Prox RCA lesion is 100% stenosed. (RPAV filled by collaterals from Dist Cx) ?Colon Flattery LM lesion is 60% stenosed. ?- Mid LAD lesion is 80% stenosed. ?- 2nd Diag lesion is 85% stenosed. ?- 1st Diag lesion is 99% stenosed. ?- Prox LAD lesion is 80% stenosed. ?- Mid Cx lesion is 70% stenosed. ?- Not CABG candidate with poor targets, malnutrition, marked LV systolic dysfunction.  ?- Reviewed with IC team. Not candidate for PCI at this point ?- cMRI EF 24% with < 50% wall thickness LGE in all areas except apex. Coronary anatomy not favorable for PCI ?- No further chest pain, off NTG.   ?- Continue Plavix.  ?- Continue ASA 81 mg daily  ?- Continue Atorva 80 mg  ?- no ? blocker w/ low output HF  ?  ?3. Type 2 Diabetes  ?- not on meds PTA, not followed by PCP   ?- Hgb A1c 11.5 ?- Insulin started  ?- Appreciate DM coordinator ?- Will need to establish care w/ PCP post d/c. CM assisting   ?  ?4. Hypokalemia ?- resolved  ?- K 4.3 today.  ?  ?5. ID ?- CXR + b/l atelectasis vs  infiltrates, but doubt PNA (no cough)   ?- WBC 13>>8.5K>10.6, AF  ?- PCT 0.21>>0.81 ?- UA + mod leukocytes. Nitrate negative. ? ? 6. HLD, LDL goal < 70  ?- LDL 102 ?- Has been started on Atorva 80 ?- Needs f/u FLP and HFTs in 6-8 wks ? ?Can discharge later today. ? ?No insurance. Will arrange for her to get HF medications from TOC. ? ?FINCH, LINDSAY N, PA-C  ?11:39 AM ? ?  Patient seen and examined with the above-signed Advanced Practice Provider and/or Housestaff. I personally reviewed laboratory data, imaging studies and relevant notes. I independently examined the patient and formulated the important aspects of the plan. I have edited the note to reflect any of my changes or salient points. I have personally discussed the plan with the patient and/or family. ? ?Feels better. No CP or SOB. Co-ox stable ? ?General:  Well appearing. No resp difficulty ?HEENT: normal ?Neck: supple. no JVD. Carotids 2+ bilat; no bruits. No lymphadenopathy or thryomegaly appreciated. ?Cor: PMI nondisplaced. Regular rate & rhythm. No rubs, gallops or murmurs. ?Lungs: clear ?Abdomen: soft, nontender, nondistended. No hepatosplenomegaly. No bruits or masses. Good bowel sounds. ?Extremities: no cyanosis, clubbing, rash, edema ?Neuro: alert & orientedx3, cranial nerves grossly intact. moves all 4 extremities w/o difficulty. Affect pleasant ? ?cMRI reviewed. EF 24% significant LGE but some viability. No good targets for PCI. Continue medical management. Can d/c today on above meds. F/u in HF Clinic arranged.  ? ?Glori Bickers, MD  ?7:08 PM ? ? ? ? ? ?

## 2021-10-17 NOTE — Progress Notes (Signed)
Mobility Specialist Progress Note ? ? 10/17/21 1215  ?Mobility  ?Activity Ambulated independently in hallway  ?Level of Assistance Standby assist, set-up cues, supervision of patient - no hands on  ?Assistive Device None  ?Distance Ambulated (ft) 790 ft  ?Activity Response Tolerated well  ?$Mobility charge 1 Mobility  ? ?Received pt in bed having no complaints and agreeable to mobility. Asymptomatic throughout ambulation, returned back to bed w/ call bell in reach and all needs met. ? ?Holland Falling ?Mobility Specialist ?Phone Number [phone] ? ?

## 2021-10-17 NOTE — Progress Notes (Signed)
Physical Therapy Treatment ?Patient Details ?Name: Brooke [name] ?MRN: [id number] ?DOB: [birth year] ?Today's Date: 10/17/2021 ? ? ?History of Present Illness 61 y.o. female admitted 10/08/21 with SOB and CP. She was found to have elevated BNP, mildly elevated troponin. CT angio negative for PE.  She was placed on BiPAP and diuretics with good response. S/p right/left heart cath and coronary angiography 3/2 which revealed severe 3 vessel CAD with diffusely diseased targets. Dx:  cardiogenic shock, acute HF syndrome, CAD, 2D ECHO showed severe biventricular dysfunction LVEF 20-25%.  PMH includes;  pelvic floor dysfunction, endometriosis, HTN ? ?  ?PT Comments  ? ? The pt was eager to continue with OOB mobility and endurance training today. She has been mobilizing well with other staff, so session was focused on education regarding exercise program for home as well as goals for continued progression. The pt was able to complete hallway ambulation without need for UE support or assist, and completed x 8 short speed intervals with good change in pace noted. She also completed x 4 bouts of walking marches with no increased assist needed. Will continue to benefit from skilled PT to progress functional strength and endurance training.  ? ?Gait Speed: 0.21ms. (Gait speed < 1.084m indicates increased risk of falls) ? ?60 Second Sit-Stand: The patient completed 7 sit-stands in a 30 second period and 12 sit-stands in a 60 second period. ( < 14 in 30 seconds for individuals aged <6952ears old indicates below average activity tolerance and increased risk of falls)  ?   ?Recommendations for follow up therapy are one component of a multi-disciplinary discharge planning process, led by the attending physician.  Recommendations may be updated based on patient status, additional functional criteria and insurance authorization. ? ?Follow Up Recommendations ? Other (comment) (Cardiac Rehab) ?  ?  ?Assistance Recommended at Discharge PRN   ?Patient can return home with the following Help with stairs or ramp for entrance;Assistance with cooking/housework ?  ?Equipment Recommendations ? None recommended by PT  ?  ?Recommendations for Other Services   ? ? ?  ?Precautions / Restrictions Precautions ?Precautions: Fall ?Restrictions ?Weight Bearing Restrictions: No  ?  ? ?Mobility ? Bed Mobility ?Overal bed mobility: Modified Independent ?  ?  ?  ?  ?  ?  ?General bed mobility comments: slightly increased time, pt able to complete without assist ?  ? ?Transfers ?Overall transfer level: Modified independent ?Equipment used: None ?Transfers: Sit to/from Stand ?Sit to Stand: Min guard ?  ?  ?  ?  ?  ?General transfer comment: Able to come to stand without LOB, min guard for safety and line management. Extra time standing at EOB to get accomodated to being upright again ?  ? ?Ambulation/Gait ?Ambulation/Gait assistance: Min guard ?Gait Distance (Feet): 470 Feet ?Assistive device: None ?Gait Pattern/deviations: Step-through pattern, Decreased stride length ?Gait velocity: 0.63 m/s self-selected; 0.83 m/s when asked to walk fast ?Gait velocity interpretation: 1.31 - 2.62 ft/sec, indicative of limited community ambulator ?  ?General Gait Details: pt with slowed gait, but able to complete short bouts of speed intervals as well as walking high-knees to build intensity. good change in pace when asked to walk "fast" ? ? ? ?  ?Balance Overall balance assessment: No apparent balance deficits (not formally assessed) ?  ?  ?  ?  ?  ?  ?  ?  ?  ?  ?  ?  ?  ?  ?  ?  ?  ?  ?  ? ?  ?  Cognition Arousal/Alertness: Awake/alert ?Behavior During Therapy: [facility] for tasks assessed/performed ?Overall Cognitive Status: Within Functional Limits for tasks assessed ?  ?  ?  ?  ?  ?  ?  ?  ?  ?  ?  ?  ?  ?  ?  ?  ?General Comments: Very pleasant ?  ?  ? ?  ?Exercises Other Exercises ?Other Exercises: sit-stand from recliner for 60 sec. 7 in 30 sec then 12 in 60 sec ? ?  ?General Comments  General comments (skin integrity, edema, etc.): VSS on RA ?  ?  ? ?Pertinent Vitals/Pain Pain Assessment ?Pain Assessment: No/denies pain ?Pain Intervention(s): Monitored during session  ? ? ? ?PT Goals (current goals can now be found in the care plan section) Acute Rehab PT Goals ?Patient Stated Goal: to get stronger ?PT Goal Formulation: With patient ?Time For Goal Achievement: 10/26/21 ?Potential to Achieve Goals: Good ?Progress towards PT goals: Progressing toward goals ? ?  ?Frequency ? ? ? Min 2X/week ? ? ? ?  ?PT Plan Current plan remains appropriate  ? ? ?   ?AM-PAC PT "6 Clicks" Mobility   ?Outcome Measure ? Help needed turning from your back to your side while in a flat bed without using bedrails?: None ?Help needed moving from lying on your back to sitting on the side of a flat bed without using bedrails?: None ?Help needed moving to and from a bed to a chair (including a wheelchair)?: A Little ?Help needed standing up from a chair using your arms (e.g., wheelchair or bedside chair)?: A Little ?Help needed to walk in hospital room?: A Little ?Help needed climbing 3-5 steps with a railing? : A Little ?6 Click Score: 20 ? ?  ?End of Session Equipment Utilized During Treatment: Gait belt ?Activity Tolerance: Patient tolerated treatment well ?Patient left: with call bell/phone within reach;in chair ?Nurse Communication: Mobility status ?PT Visit Diagnosis: Unsteadiness on feet (R26.81);Other abnormalities of gait and mobility (R26.89);Difficulty in walking, not elsewhere classified (R26.2) ?  ? ? ?Time: 9518-8416 ?PT Time Calculation (min) (ACUTE ONLY): 23 min ? ?Charges:  $Therapeutic Exercise: 23-37 mins          ?          ? ?West Carbo, PT, DPT  ? ?Acute Rehabilitation Department ?Pager #: [phone] - [phone] ? ? ?Sandra Cockayne ?10/17/2021, 2:52 PM ? ?

## 2021-10-17 NOTE — TOC CM/SW Note (Addendum)
HF TOC CM spoke to pt about MATCH/HF funds to assist with medications. MATCH entered. Will have meds come up from [facility]. Possible dc home Friday or Sat. If dc home Sat, will have pharmacy hold for delivery to pt. Message sent to attending to have dc meds sent to [facility] pharmacy Friday.  ? ?Jonnie Finner RN3 CCM, Heart Failure TOC CM [phone]  ?

## 2021-10-21 ENCOUNTER — Other Ambulatory Visit (HOSPITAL_COMMUNITY): Payer: Self-pay

## 2021-10-21 MED ORDER — SACUBITRIL-VALSARTAN 49-51 MG PO TABS: 1.0000 | ORAL_TABLET | Freq: Two times a day (BID) | ORAL | 3 refills | Status: DC

## 2021-10-23 ENCOUNTER — Telehealth (HOSPITAL_COMMUNITY): Payer: Self-pay

## 2021-10-23 NOTE — Telephone Encounter (Signed)
Called patient to see if she is interested in the Cardiac Rehab Program. Patient expressed interest in the virtual cardiac rehab only. Explained scheduling process, patient verbalized understanding. Will contact patient for scheduling once f/u has been completed. ?

## 2021-10-27 ENCOUNTER — Telehealth (HOSPITAL_COMMUNITY): Payer: Self-pay

## 2021-10-27 NOTE — Telephone Encounter (Signed)
Transitions of Care Pharmacy  ° °Call attempted for a pharmacy transitions of care follow-up. HIPAA appropriate voicemail was left with call back information provided.  ° °Call attempt #1. Will follow-up in 2-3 days.  °  °

## 2021-10-27 NOTE — Progress Notes (Signed)
? ?ADVANCED HF CLINIC CONSULT NOTE ? ? ?Primary Care: Freeman Caldron, PA-C ?HF Cardiologist: Dr. Haroldine Laws ? ?HPI: ?Brooke [name] is a 61 y.o. female w/ DM2, chronic pelvic floor dysfunction, protein calorie malnutrition, and new diagnosis of CAD and systolic heart failure/iCM. ? ?No routine preventative care. Has not seen a PCP nor been on medications for > 2 years due to issues w/ her insurance.  ?  ?Admitted 3/23 with acute CHF and cardiogenic shock. Chest CT negative for PE, + for moderate b/l pleural effusions and multivessel coronary artery calcifications. She was started on empiric milrinone and diuresed with IV lasix. Echo showed severe biventricular dysfunction, LVEF 20-25%, GIIDD, RV moderately reduced, trivial MR. Underwent R/LHC showing severe multivessel CAD with severe LV dysfunction, low filling pressures and low output. Milrinone increased and AHF consulted. Not CABG candidate with poor targets, malnutrition and LV systolic dysfunction. Cardiac MRI showed LVEF 24% with < 50% wall thickness LGE in all areas except apex. Coronary anatomy not favorable for PCI. Milrinone weaned off and GDMT titrated. Insulin started with A1C 11.5. Discharged home, weight 100 lbs. ?  ?Today she returns for post hospital HF follow up. Overall feeling fine, has some fatigue. Main complaint is LLE weakness/pain. No SOB with walking or completing her home exercises.  Denies abnormal bleeding, palpitations, CP, dizziness, edema, or PND/Orthopnea. Appetite ok. No fever or chills. Weight at home 98-101 pounds. Taking all medications.  ? ?Cardiac Studies: ?- Echo (3/23): severe biventricular dysfunction, LVEF 20-25%, GIIDD, RV moderately reduced, trivial MR. ? ?- R/LHC (3/23): severe multivessel CAD with severe LV dysfunction, low filling pressures and low output. ?  Prox RCA lesion is 100% stenosed. ?  Ost LM lesion is 60% stenosed. ?  Mid LAD lesion is 80% stenosed. ?  2nd Diag lesion is 85% stenosed. ?  1st Diag lesion is  99% stenosed. ?  Prox LAD lesion is 80% stenosed. ?  Mid Cx lesion is 70% stenosed. ?  The left ventricular ejection fraction is 25-35% by visual estimate. ?  ?1.  Severe multivessel disease. ?2.  Severe LV dysfunction ?3.  Fick cardiac output of 5.2 L/min and Fick cardiac index of 3.4 L a minute per meter squared with a PA saturation of 68%. ?4.  Thermodilution outputs done in triplicate demonstrated a cardiac output of 3 L/min and an index of 2 L/min/m?; milrinone was increased to 2.5 mcg per kilogram per minute. ?5.  Cardiac hemodynamics as follows: ?  ?A.  Right atrial pressure 9/7 with a mean of 5 mmHg ?B.  RV 38/3 with an end-diastolic pressure of 18 mmHg ?C.  Wedge pressure of 12/11 with a mean of 10 mmHg ?D.  Pulmonary artery pressure of 23/12 with a mean pressure of 17 mmHg; PAPI =9/5=1.8 ?E.  LVEDP of 19mHg. ? ?- Cardiac MRI (3/23): showed LVEF 24% with < 50% wall thickness LGE in all areas except apex.  ? ?Review of Systems: [y] = yes, '[ ]'$  = no  ? ?General: Weight gain '[ ]'$ ; Weight loss '[ ]'$ ; Anorexia [Blue.Reese]; Fatigue '[ ]'$ ; Fever '[ ]'$ ; Chills '[ ]'$ ; Weakness [Blue.Reese]  ?Cardiac: Chest pain/pressure '[ ]'$ ; Resting SOB '[ ]'$ ; Exertional SOB '[ ]'$ ; Orthopnea '[ ]'$ ; Pedal Edema '[ ]'$ ; Palpitations '[ ]'$ ; Syncope '[ ]'$ ; Presyncope '[ ]'$ ; Paroxysmal nocturnal dyspnea'[ ]'$   ?Pulmonary: Cough '[ ]'$ ; Wheezing'[ ]'$ ; Hemoptysis'[ ]'$ ; Sputum '[ ]'$ ; Snoring '[ ]'$   ?GI: Vomiting'[ ]'$ ; Dysphagia'[ ]'$ ; Melena'[ ]'$ ; Hematochezia '[ ]'$ ; Heartburn'[ ]'$ ; Abdominal  pain '[ ]'$ ; Constipation '[ ]'$ ; Diarrhea '[ ]'$ ; BRBPR '[ ]'$   ?GU: Hematuria'[ ]'$ ; Dysuria '[ ]'$ ; Nocturia'[ ]'$   ?Vascular: Pain in legs with walking '[ ]'$ ; Pain in feet with lying flat '[ ]'$ ; Non-healing sores '[ ]'$ ; Stroke '[ ]'$ ; TIA '[ ]'$ ; Slurred speech '[ ]'$ ;  ?Neuro: Headaches'[ ]'$ ; Vertigo'[ ]'$ ; Seizures'[ ]'$ ; Paresthesias'[ ]'$ ;Blurred vision '[ ]'$ ; Diplopia '[ ]'$ ; Vision changes '[ ]'$   ?Ortho/Skin: Arthritis '[ ]'$ ; Joint pain '[ ]'$ ; Muscle pain Blue.Reese ]; Joint swelling '[ ]'$ ; Back Pain '[ ]'$ ; Rash '[ ]'$   ?Psych: Depression'[ ]'$ ; Anxiety'[ ]'$   ?Heme: Bleeding  problems '[ ]'$ ; Clotting disorders '[ ]'$ ; Anemia '[ ]'$   ?Endocrine: Diabetes [ y]; Thyroid dysfunction'[ ]'$  ? ?Past Medical History:  ?Diagnosis Date  ? Abdominal pain, left lower quadrant 12/17/2015  ? Chest pain, unspecified 10/05/2003  ? Depressive disorder, not elsewhere classified 07/31/2005  ? Diabetes mellitus without complication ([place]) 24/40/1027  ? Endometriosis 09/14/2012  ? Hypertension 11/23/2003  ? Unspecified constipation 06/24/2004  ? ?Current Outpatient Medications  ?Medication Sig Dispense Refill  ? acetaminophen (TYLENOL) 500 MG tablet Take 1,000 mg by mouth every 6 (six) hours as needed for mild pain, fever or headache.    ? ALPRAZolam (XANAX) 0.25 MG tablet Take 1 tablet (0.25 mg total) by mouth daily as needed for anxiety. 30 tablet 0  ? aspirin 81 MG chewable tablet Chew 1 tablet (81 mg total) by mouth daily. 30 tablet 1  ? atorvastatin (LIPITOR) 80 MG tablet Take 1 tablet (80 mg total) by mouth daily. 30 tablet 1  ? clopidogrel (PLAVIX) 75 MG tablet Take 1 tablet (75 mg total) by mouth daily. 30 tablet 1  ? dapagliflozin propanediol (FARXIGA) 10 MG TABS tablet Take 1 tablet (10 mg total) by mouth daily. 30 tablet 1  ? diazepam (VALIUM) 5 MG tablet Place 1 tablet vaginally nightly as needed for muscle spasm/ pelvic pain. 30 tablet 0  ? digoxin (LANOXIN) 0.125 MG tablet Take 1 tablet (0.125 mg total) by mouth daily. 30 tablet 1  ? Insulin Glargine (BASAGLAR KWIKPEN) 100 UNIT/ML Inject 8 Units into the skin daily. 3 mL 1  ? Insulin Pen Needle (PENTIPS) 32G X 4 MM MISC Use as directed with insulin pen 100 each 0  ? sacubitril-valsartan (ENTRESTO) 49-51 MG Take 1 tablet by mouth 2 (two) times daily. 180 tablet 3  ? spironolactone (ALDACTONE) 25 MG tablet Take 1 tablet (25 mg total) by mouth daily. 30 tablet 1  ? vitamin B-12 (CYANOCOBALAMIN) 1000 MCG tablet Take 3,000 mcg by mouth daily.    ? ?No current facility-administered medications for this encounter.  ? ?Allergies  ?Allergen Reactions  ? Aspirin   ?   Intolerance (aspirin sensitive stomach)  ? Darvocet [Propoxyphene N-Acetaminophen]   ? Erythromycin   ? Gabapentin Other (See Comments)  ?  Suicidal ideations   ? Ibuprofen   ? Metformin And Related Nausea Only  ? ?Social History  ? ?Socioeconomic History  ? Marital status: Married  ?  Spouse name: Not on file  ? Number of children: Not on file  ? Years of education: Not on file  ? Highest education level: Not on file  ?Occupational History  ? Not on file  ?Tobacco Use  ? Smoking status: Never  ? Smokeless tobacco: Never  ?Vaping Use  ? Vaping Use: Never used  ?Substance and Sexual Activity  ? Alcohol use: No  ? Drug use: No  ? Sexual activity: Not Currently  ?  Other Topics Concern  ? Not on file  ?Social History Narrative  ? Not on file  ? ?Social Determinants of Health  ? ?Financial Resource Strain: Not on file  ?Food Insecurity: Not on file  ?Transportation Needs: Not on file  ?Physical Activity: Not on file  ?Stress: Not on file  ?Social Connections: Not on file  ?Intimate Partner Violence: Not on file  ? ?Family History  ?Problem Relation Age of Onset  ? Diabetes Mother   ? Hypertension Mother   ? Arthritis Mother   ?     RA  ? Pancreatic cancer Father   ? Stomach cancer Neg Hx   ? Colon cancer Neg Hx   ? Esophageal cancer Neg Hx   ? Rectal cancer Neg Hx   ? ?BP (!) 94/50   Pulse 84   Ht 5' 4.5" (1.638 m)   Wt 45.7 kg   LMP 11/16/2014 (Approximate)   SpO2 99%   BMI 17.04 kg/m?  ? ?Wt Readings from Last 3 Encounters:  ?10/31/21 45.7 kg  ?10/17/21 46.8 kg  ?03/17/21 48.1 kg  ? ?PHYSICAL EXAM: ?General:  NAD. No resp difficulty, thin ?HEENT: Normal ?Neck: Supple. No JVD. Carotids 2+ bilat; no bruits. No lymphadenopathy or thryomegaly appreciated. ?Cor: PMI nondisplaced. Regular rate & rhythm. No rubs, gallops or murmurs. ?Lungs: Clear ?Abdomen: Soft, nontender, nondistended. No hepatosplenomegaly. No bruits or masses. Good bowel sounds. ?Extremities: No cyanosis, clubbing, rash, edema ?Neuro: Alert & oriented  x 3, cranial nerves grossly intact. Moves all 4 extremities w/o difficulty. Affect pleasant. ? ?ECG (personally reviewed): NSR 85 bpm ? ?ASSESSMENT & PLAN: ?1. Chronic Biventricular Heart Failure. ?- New/iCM  ?-

## 2021-10-28 ENCOUNTER — Telehealth (HOSPITAL_COMMUNITY): Payer: Self-pay

## 2021-10-28 NOTE — Telephone Encounter (Signed)
Transitions of Care Pharmacy   Call attempted for a pharmacy transitions of care follow-up. HIPAA appropriate voicemail was left with call back information provided.   Call attempt #2. Will follow-up in 2-3 days.    

## 2021-10-29 ENCOUNTER — Telehealth (HOSPITAL_COMMUNITY): Payer: Self-pay

## 2021-10-29 NOTE — Telephone Encounter (Signed)
Transitions of Care Pharmacy   Call attempted for a pharmacy transitions of care follow-up. HIPAA appropriate voicemail was left with call back information provided.   Call attempt #3. Will no longer attempt follow up for TOC pharmacy.   

## 2021-10-31 ENCOUNTER — Ambulatory Visit (HOSPITAL_COMMUNITY)
Admit: 2021-10-31 | Discharge: 2021-10-31 | Disposition: A | Discharge status: Home / Self Care | Payer: Medicaid Other | Attending: Family Medicine | Admitting: Family Medicine

## 2021-10-31 ENCOUNTER — Other Ambulatory Visit: Payer: Self-pay

## 2021-10-31 ENCOUNTER — Encounter (HOSPITAL_COMMUNITY): Payer: Self-pay

## 2021-10-31 ENCOUNTER — Other Ambulatory Visit (HOSPITAL_COMMUNITY): Payer: Self-pay

## 2021-10-31 VITALS — BP 94/50 | HR 84 | Ht 64.5 in | Wt 100.8 lb

## 2021-10-31 DIAGNOSIS — Z7902 Long term (current) use of antithrombotics/antiplatelets: Secondary | ICD-10-CM | POA: Diagnosis not present

## 2021-10-31 DIAGNOSIS — E785 Hyperlipidemia, unspecified: Secondary | ICD-10-CM | POA: Insufficient documentation

## 2021-10-31 DIAGNOSIS — I5022 Chronic systolic (congestive) heart failure: Secondary | ICD-10-CM

## 2021-10-31 DIAGNOSIS — I110 Hypertensive heart disease with heart failure: Secondary | ICD-10-CM | POA: Diagnosis not present

## 2021-10-31 DIAGNOSIS — Z79899 Other long term (current) drug therapy: Secondary | ICD-10-CM | POA: Diagnosis not present

## 2021-10-31 DIAGNOSIS — Z794 Long term (current) use of insulin: Secondary | ICD-10-CM | POA: Insufficient documentation

## 2021-10-31 DIAGNOSIS — Z7982 Long term (current) use of aspirin: Secondary | ICD-10-CM | POA: Insufficient documentation

## 2021-10-31 DIAGNOSIS — E119 Type 2 diabetes mellitus without complications: Secondary | ICD-10-CM | POA: Diagnosis not present

## 2021-10-31 DIAGNOSIS — I5082 Biventricular heart failure: Secondary | ICD-10-CM | POA: Insufficient documentation

## 2021-10-31 DIAGNOSIS — E46 Unspecified protein-calorie malnutrition: Secondary | ICD-10-CM | POA: Insufficient documentation

## 2021-10-31 DIAGNOSIS — I2510 Atherosclerotic heart disease of native coronary artery without angina pectoris: Secondary | ICD-10-CM | POA: Diagnosis not present

## 2021-10-31 LAB — BASIC METABOLIC PANEL
Anion gap: 8 (ref 5–15)
BUN: 11 mg/dL (ref 6–20)
CO2: 25 mmol/L (ref 22–32)
Calcium: 9.1 mg/dL (ref 8.9–10.3)
Chloride: 103 mmol/L (ref 98–111)
Creatinine, Ser: 0.71 mg/dL (ref 0.44–1.00)
GFR, Estimated: >60 mL/min (ref >60)
Glucose, Bld: 236 mg/dL — ABNORMAL HIGH (ref 70–99)
Potassium: 4 mmol/L (ref 3.5–5.1)
Sodium: 136 mmol/L (ref 135–145)

## 2021-10-31 LAB — DIGOXIN LEVEL: Digoxin Level: 0.9 ng/mL (ref 0.8–2.0)

## 2021-10-31 IMAGING — CT CT ABD-PELV W/ CM
2 of 5 series · 16 of 46 positions shown, 18 images · IV contrast (OMNIPAQUE 300)
Comparison: Pelvic ultrasound 05/27/2016

CLINICAL DATA: Bilateral upper abdominal pain.

EXAM:
CT ABDOMEN AND PELVIS WITH CONTRAST
TECHNIQUE: Multidetector CT imaging of the abdomen and pelvis was performed
using the standard protocol following bolus administration of
intravenous contrast.
CONTRAST:  80mL OMNIPAQUE IOHEXOL 300 MG/ML  SOLN

[Series 2: axial st · axial · 0.70mm/px · z∈[+1213,+1578]mm · 13 of 85 slices shown, 15 images]
[im 6/85  soft-tissue]
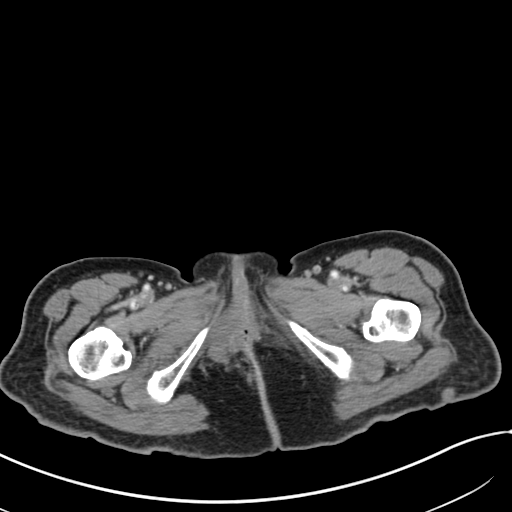
[im 6/85  bone]
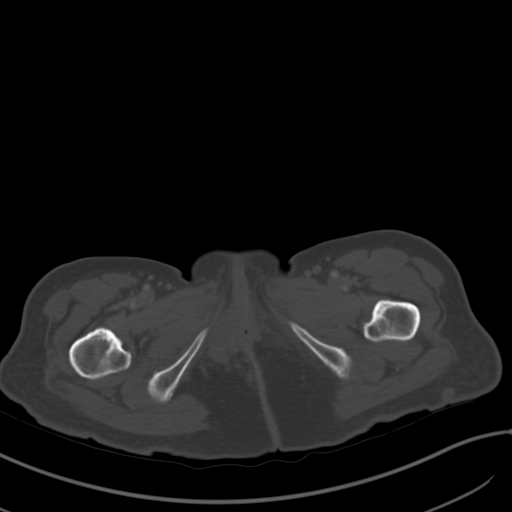
[im 11/85  soft-tissue]
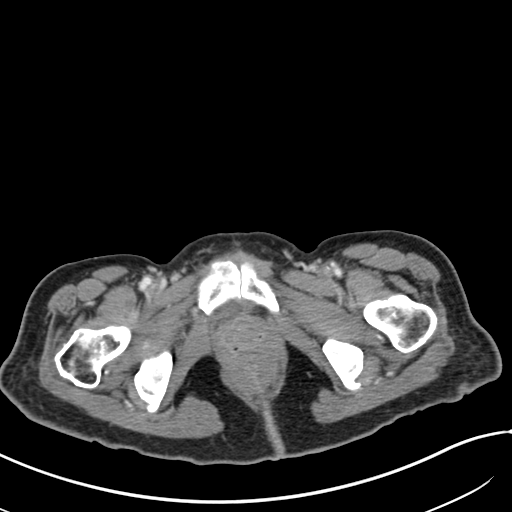
[im 16/85  soft-tissue]
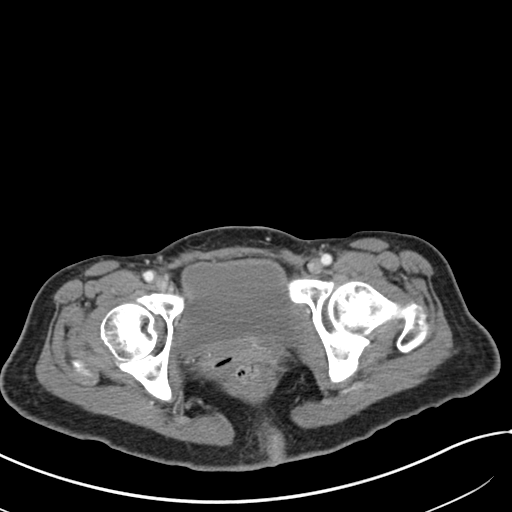
[im 27/85  soft-tissue]
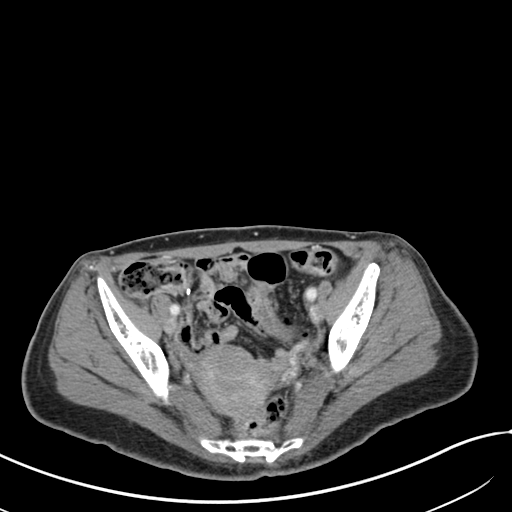
[im 32/85  soft-tissue]
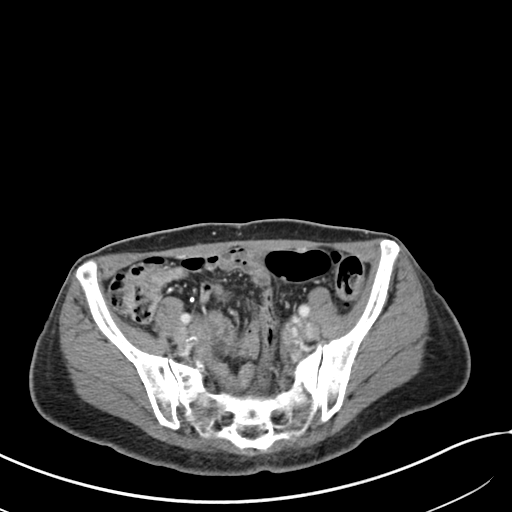
[im 37/85  soft-tissue]
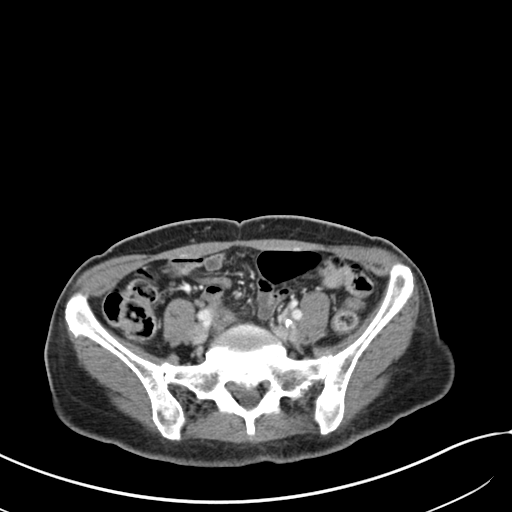
[im 43/85  soft-tissue]
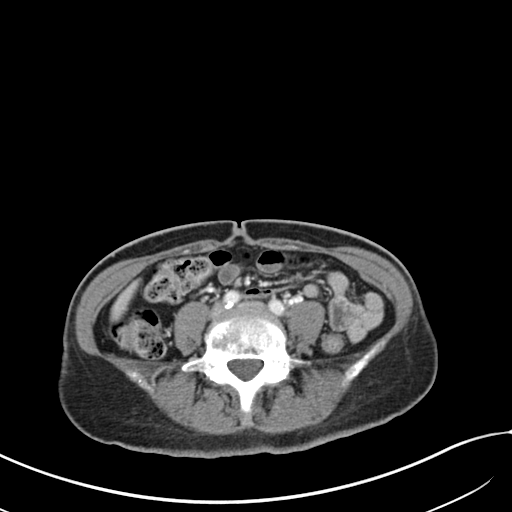
[im 48/85  soft-tissue]
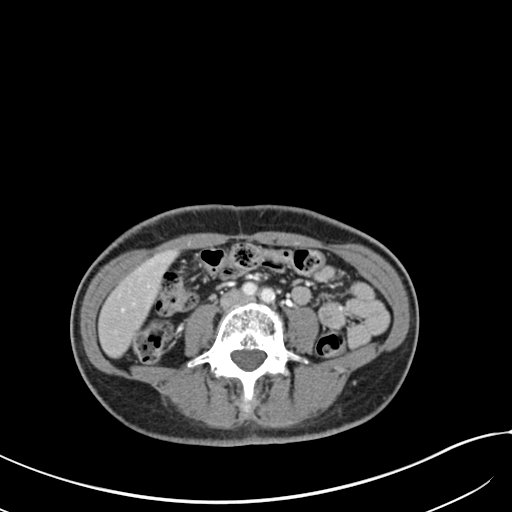
[im 53/85  soft-tissue]
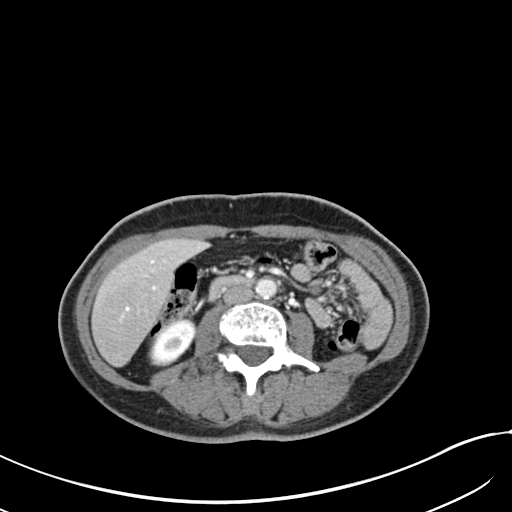
[im 53/85  bone]
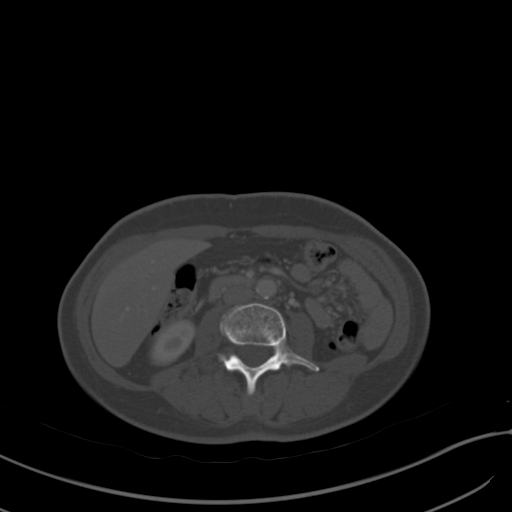
[im 58/85  soft-tissue]
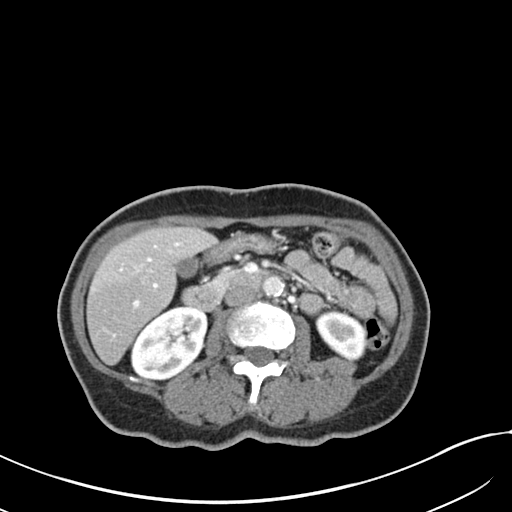
[im 69/85  soft-tissue]
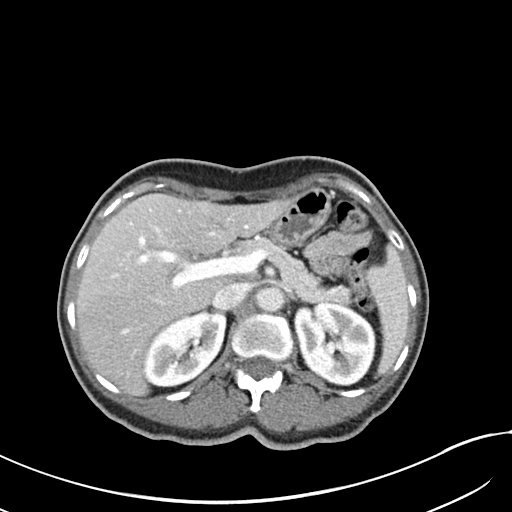
[im 74/85  soft-tissue]
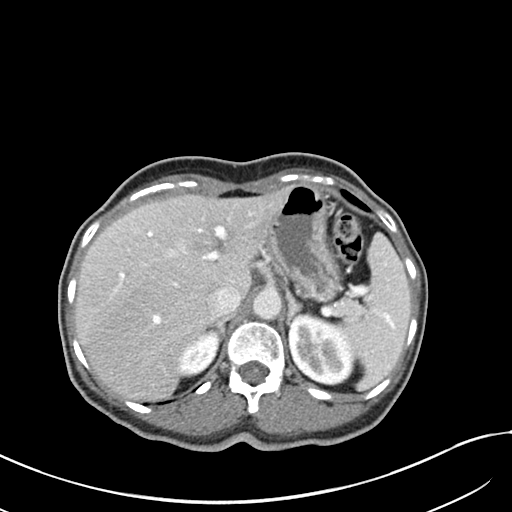
[im 79/85  soft-tissue]
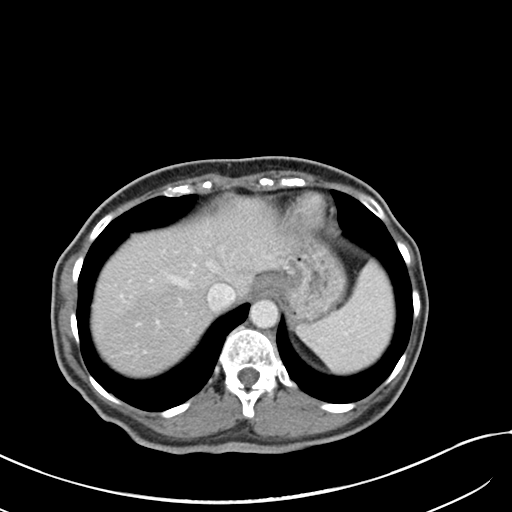

[Series 4: coronal st · coronal · 0.79mm/px · 3 of 108 slices shown]
[im 36/108  soft-tissue]
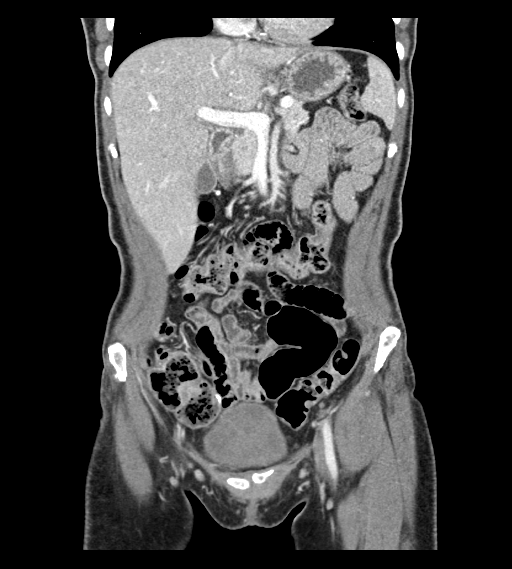
[im 48/108  soft-tissue]
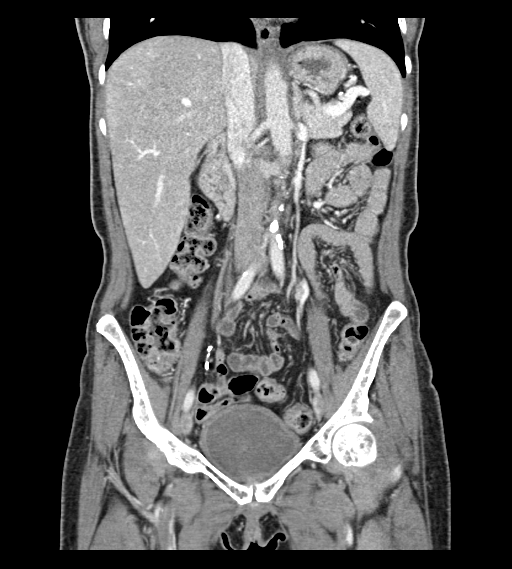
[im 60/108  soft-tissue]
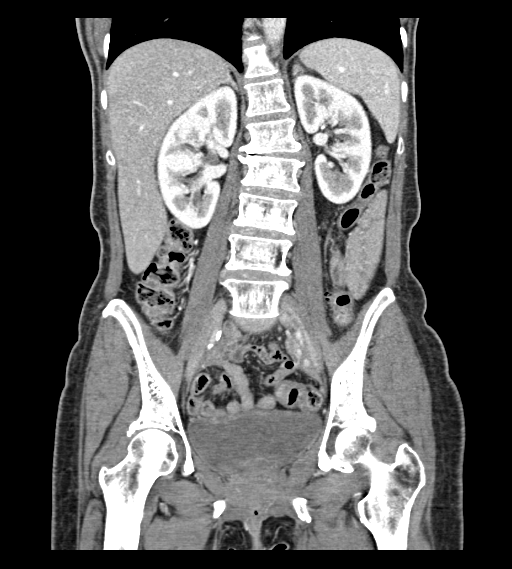

[16 of 46 positions shown; findings below may reference images not displayed]

FINDINGS: Lower chest: No acute findings. Incidental accessory fissure in the
right lower lobe.

Hepatobiliary: No focal liver abnormality is seen. No gallstones,
gallbladder wall thickening, or biliary dilatation.

Pancreas: No ductal dilatation or inflammation.

Spleen: Normal in size without focal abnormality.

Adrenals/Urinary Tract: Normal adrenal glands. No hydronephrosis or
perinephric edema. Homogeneous renal enhancement with symmetric
excretion on delayed phase imaging. Urinary bladder is
physiologically distended without wall thickening.

Stomach/Bowel: Detailed bowel evaluation is limited in the absence
of enteric contrast. Minimal distal esophageal wall thickening.
Decompressed stomach. No small bowel obstruction or inflammatory
change. Appendectomy. Moderate colonic stool burden. Mild descending
colonic diverticulosis. No diverticulitis. No colonic wall
thickening or inflammation.

Vascular/Lymphatic: No acute vascular findings. Abdominal aorta is
normal in caliber. Aortic atherosclerosis. Patent portal vein.
Circumaortic left renal vein. Mesenteric vessels are patent. No
adenopathy.

Reproductive: Bulbous uterus with fibroids, less well-defined on the
current exam than previously. Uterus is retroverted. There is no
adnexal mass. Ovaries are not well-defined.

Other: Trace free fluid in the pelvis. No upper abdominal ascites.
No free air. Tiny fat containing umbilical hernia.

Musculoskeletal: Mild scoliosis and degenerative change in the
lumbar spine. There are no acute or suspicious osseous
abnormalities.
IMPRESSION: 1. Minimal distal esophageal wall thickening, can be seen with
reflux or esophagitis.
2. Mild descending colonic diverticulosis without diverticulitis.
3. Bulbous uterus with fibroids, less well-defined on the current
exam than previously.

Aortic Atherosclerosis ([id number].G).

## 2021-10-31 MED ORDER — CLOPIDOGREL BISULFATE 75 MG PO TABS
75.0000 mg | ORAL_TABLET | Freq: Every day | ORAL | 3 refills | Status: DC
Start: 2021-10-31 — End: 2022-03-10
  Filled 2021-10-31 – 2021-11-14 (×2): qty 30, 30d supply, fill #0
  Filled 2021-12-15: qty 30, 30d supply, fill #1
  Filled 2022-01-15: qty 30, 30d supply, fill #2
  Filled 2022-02-19: qty 30, 30d supply, fill #3

## 2021-10-31 MED ORDER — ATORVASTATIN CALCIUM 80 MG PO TABS
80.0000 mg | ORAL_TABLET | Freq: Every day | ORAL | 3 refills | Status: DC
  Filled 2021-10-31 – 2021-11-14 (×2): qty 30, 30d supply, fill #0
  Filled 2021-12-15: qty 30, 30d supply, fill #1
  Filled 2022-01-15: qty 30, 30d supply, fill #2
  Filled 2022-02-11: qty 30, 30d supply, fill #3

## 2021-10-31 MED ORDER — DIGOXIN 125 MCG PO TABS
0.1250 mg | ORAL_TABLET | Freq: Every day | ORAL | 3 refills | Status: DC
  Filled 2021-10-31: qty 30, 30d supply, fill #0

## 2021-10-31 MED ORDER — DAPAGLIFLOZIN PROPANEDIOL 10 MG PO TABS
10.0000 mg | ORAL_TABLET | Freq: Every day | ORAL | 3 refills | Status: DC
Start: 2021-10-31 — End: 2022-03-10
  Filled 2021-10-31 – 2021-11-14 (×2): qty 30, 30d supply, fill #0
  Filled 2021-12-15: qty 30, 30d supply, fill #1
  Filled 2022-01-15 – 2022-01-19 (×3): qty 30, 30d supply, fill #2
  Filled 2022-02-20: qty 30, 30d supply, fill #0

## 2021-10-31 MED ORDER — SPIRONOLACTONE 25 MG PO TABS
25.0000 mg | ORAL_TABLET | Freq: Every day | ORAL | 3 refills | Status: DC
  Filled 2021-10-31 – 2021-11-14 (×2): qty 30, 30d supply, fill #0
  Filled 2021-12-15: qty 30, 30d supply, fill #1
  Filled 2022-01-15: qty 30, 30d supply, fill #2
  Filled 2022-02-11: qty 30, 30d supply, fill #3

## 2021-10-31 NOTE — Progress Notes (Addendum)
Pt provided with samples of Entresto, she signed [employer] pt assist application as well. ? ?Medication Samples have been provided to the patient. ? ?Drug name: Entresto       Strength: 49/'51mg'$         Qty: 2  LOT: [id number]  Exp.Date: 1/25 ? ?Dosing instructions: Take 1 tab Twice daily  ? ?The patient has been instructed regarding the correct time, dose, and frequency of taking this medication, including desired effects and most common side effects.  ? ?Brooke [name] ?3:22 PM ?10/31/2021 ? ?

## 2021-10-31 NOTE — Patient Instructions (Signed)
Medication Changes: ? ?No Changes ? ?Refills for cardiac medications have been sent to the [facility] ? ?Lab Work: ? ?Done today, we will call you for abnormal results ? ?Testing/Procedures: ? ?Your physician has requested that you have an echocardiogram. Echocardiography is a painless test that uses sound waves to create images of your heart. It provides your doctor with information about the size and shape of your heart and how well your heart?s chambers and valves are working. This procedure takes approximately one hour. There are no restrictions for this procedure. IN 3-4 MONTHS ? ?Referrals: ? ?None ? ?Special Instructions // Education: ? ?Do the following things EVERYDAY: ?Weigh yourself in the morning before breakfast. Write it down and keep it in a log. ?Take your medicines as prescribed ?Eat low salt foods--Limit salt (sodium) to 2000 mg per day.  ?Stay as active as you can everyday ?Limit all fluids for the day to less than 2 liters ? ? ?Follow-Up in: 3-4 weeks and again in 3-4 months with an echocardiogram ? ?At the [facility], you and your health needs are our priority. We have a designated team specialized in the treatment of Heart Failure. This Care Team includes your primary Heart Failure Specialized Cardiologist (physician), Advanced Practice Providers (APPs- Physician Assistants and Nurse Practitioners), and Pharmacist who all work together to provide you with the care you need, when you need it.  ? ?You may see any of the following providers on your designated Care Team at your next follow up: ? ?Dr Glori Bickers ?Dr Loralie Champagne ?Darrick Grinder, NP ?Lyda Jester, PA ?Jessica Milford,NP ?Marlyce Huge, PA ?Audry Riles, PharmD ? ? ?Please be sure to bring in all your medications bottles to every appointment.  ? ?Need to Contact us: ? ?If you have any questions or concerns before your next appointment please send [place] a message through [place] or call our  office at [phone].   ? ?TO LEAVE A MESSAGE FOR THE NURSE SELECT OPTION 2, PLEASE LEAVE A MESSAGE INCLUDING: ?YOUR NAME ?DATE OF BIRTH ?CALL BACK NUMBER ?REASON FOR CALL**this is important as we prioritize the call backs ? ?YOU WILL RECEIVE A CALL BACK THE SAME DAY AS LONG AS YOU CALL BEFORE 4:00 PM ? ? ? ?

## 2021-10-31 NOTE — Progress Notes (Signed)
?Heart and Vascular Care Navigation ? ?10/31/2021 ? ?Brooke [name] ?01-18-1961 ?[id number] ? ?Reason for Referral: Patient seen in clinic for post hospital visit. ?  ?Engaged with patient face to face for initial visit for Heart and Vascular Care Coordination. ?                                                                                                  ?Assessment:   Patient is a 61 yo married female. She reports she lives in a single family home with her husband and 53 yo daughter. Patient states she has not worked since 2008 as she cared for her daughter as well as her mother with dementia. Patient's husband has a end stage renal disease and is on dialysis. He receives social security disability and patient reports she has no income at this time.   She states she has attempted to apply for insurance through [employer] place although had difficult and was unable to complete the process. Patient appears to have been seen in the hospital and has a pending Medicaid at this time.   She denies any other [facility] needs at this time.                             ? ?HRT/VAS Care Coordination   ? ? Patients Home Cardiology Office Heart Failure Clinic  ? Outpatient Care Team Social Worker  ? Social Worker Name: Raquel Sarna, [place] [phone]  ? Living arrangements for the past 2 months Single Family Home  ? Lives with: Minor Children; Spouse  Husband and 46 yo daughter  ? Patient Current Insurance Coverage Self-Pay  ? Patient Has Concern With Paying Medical Bills Yes  ? Does Patient Have Prescription Coverage? No  ? Patient Prescription Assistance Programs Heart Failure Fund  ? Home Assistive Devices/Equipment None  ? DME Agency NA  ? [place] Agency NA  ? ?  ? ? ?Social History:                                                                             ?SDOH Screenings  ? ?Alcohol Screen: Not on file  ?Depression (PHQ2-9): Not on file  ?Financial Resource Strain: Medium Risk  ? Difficulty of Paying Living Expenses:  Somewhat hard  ?Food Insecurity: No Food Insecurity  ? Worried About Charity fundraiser in the Last Year: Never true  ? Ran Out of Food in the Last Year: Never true  ?Housing: Low Risk   ? Last Housing Risk Score: 0  ?Physical Activity: Not on file  ?Social Connections: Not on file  ?Stress: Not on file  ?Tobacco Use: Low Risk   ? Smoking Tobacco Use: Never  ? Smokeless Tobacco Use: Never  ? Passive Exposure: Not  on file  ?Transportation Needs: No Transportation Needs  ? Lack of Transportation (Medical): No  ? Lack of Transportation (Non-Medical): No  ? ? ?SDOH Interventions: ?Financial Resources:  Sales promotion account executive Interventions: Development worker, community ?DSS for financial assistance pending medicaid  ?Food Insecurity:  Food Insecurity Interventions: Intervention Not Indicated Has food stamps  ?Housing Insecurity:  Housing Interventions: Intervention Not Indicated  ?Transportation:   Transportation Interventions: Intervention Not Indicated  ? ? ?Other Care Navigation Interventions:    ? ?Inpatient/Outpatient Substance Abuse Counseling/Rehab Options N/a  ?Provided Pharmacy assistance resources Heart Failure Fund  ?Patient expressed Mental Health concerns No.  ?Patient Referred to: N/a  ? ?Follow-up plan:  Patient is aware of the pending medicaid and will keep a look out in the mail for further correspondence. CSW provided supportive intervention and discussed briefly the medicaid process. Patient will follow up and return call to CSW if further assistance needed for Medicaid follow up. Raquel Sarna, [place], CCSW-MCS [phone] ? ? ? ? ? ?

## 2021-11-04 ENCOUNTER — Telehealth (HOSPITAL_COMMUNITY): Payer: Self-pay | Admitting: Pharmacy Technician

## 2021-11-04 NOTE — Telephone Encounter (Addendum)
Advanced Heart Failure Patient Advocate Encounter ? ?Application for [employer] assistance sent via fax on 03/14. Document scanned to chart.  ? ?Advanced Heart Failure Patient Advocate Encounter ? ?Received notification from [employer] that the patient needs to provide POI in order to continue the application process. Called and left the patient a detailed message with what forms of POI are accepted. If she does not have those documents she would need to call [employer] to have an income attestation letter mailed out for her to fill in as well. ? ?Charlann Boxer, CPhT ? ?

## 2021-11-05 ENCOUNTER — Inpatient Hospital Stay: Payer: Self-pay | Admitting: Physician Assistant

## 2021-11-05 NOTE — Progress Notes (Deleted)
Patient ID: Brooke [name], female   DOB: 12-09-1960, 61 y.o.   MRN: [id number] ? ?After hospitalization 3/01-3/05/2022. ? ? ?Acute Biventricular Heart Failure>>Low output ?Ischemic Cardiomyopathy  ?Severe Multivessel CAD, Medically Managed  ?Poorly Controlled Type 2DM  ?Hyperlipidemia  ?Severe protein calorie malnutrition ?  ?  ?Hospital Course:  ?  ?61 y/o woman as above with h/o DM2 (no follow up), chronic malnutrition and pelvic floor dysfunction. No known h/o heart disease. ?  ?Presented to ER with CP and progressive SOB.Marland Kitchen Found to be in cardiogenic shock with lactic acid 4.0 and biventricular dysfunction on echo EF 20-25. Hs trop 44. Ecg non-acute ?  ?Started on milrinone and taken to cath lab. Cath with severely calcified 3v CAD including 60% LM, CTO RCA, pLAD 80% with diffuse severely calcified disease throughout vessel. Hemodynamic improved on milrinone. We discussed in cath lab and mechanical support not felt to be needed. She was continued on milrinone and GDMT added. Diuresed w/ IV Lasix.  ?  ?CT surgery was consulted but pt felt not a good candidate for CABG given diffusely diseased coronaries w/ no good targets and poorly controlled DM. Angiograms were reviewed w/ IC team and also felt not a candidate for PCI. cMRI with evidence of viability but no good targets for PCI. Medical therapy recommended. She was placed on ASA, Plavix and high dose statin therapy. Diabetes coordinator consulted and started insulin regimen.  ?  ?After diuresis, she was weaned off milrinone. GDMT titrated. She had no further CP  ?  ?F/u in HF clinic arranged at discharge. Referred to community health and wellness. ?  ?Received medications through [facility] program and HF fund. ? ?Cardiology f/up A/P: ?  ? ?ASSESSMENT & PLAN: ?1. Chronic Biventricular Heart Failure. ?- New/iCM  ?- Echo (3/23): EF 20-25%, RV moderately reduced. ?- LHC (3/23): w/ severe diffuse MVCAD. ?- RHC (3/23): on milrinone 0.25 w/ low filling pressures and low  CO/CI by thermo, CO 3L/min, CI 2L/m?Marland Kitchen PAPI 1.8. Milrinone increased to 0.5.  ?- cMRI (3/23): EF 24%, RVEF 48%, coronary disease type LGE present, LGE < 50% wall thickness in all areas except apex.  ?- NYHA II today, volume looks good. GDMT limited by low BP. ?- Continue Entresto 49-51 mg bid.  ?- Continue spironolactone 25 mg daily.  ?- Continue Farxiga 10 mg daily.   ?- Continue digoxin 0.125 mg daily. ?- No ? blocker yet. ?- BMET and dig level today. ?- Echo in 3-4 months after GDMT optimized. ?  ?2. CAD ?- diffuse MV CAD ?- Prox RCA lesion is 100% stenosed. (RPAV filled by collaterals from Dist Cx) ?Colon Flattery LM lesion is 60% stenosed. ?- Mid LAD lesion is 80% stenosed. ?- 2nd Diag lesion is 85% stenosed. ?- 1st Diag lesion is 99% stenosed. ?- Prox LAD lesion is 80% stenosed. ?- Mid Cx lesion is 70% stenosed. ?- Not CABG candidate with poor targets, malnutrition, marked LV systolic dysfunction.  ?- Reviewed with IC team. Not candidate for PCI at this point ?- cMRI (3/23): LVEF 24% with < 50% wall thickness LGE in all areas except apex. Coronary anatomy not favorable for PCI ?- No further chest pain. ?- Continue Plavix.  ?- Continue ASA 81 mg daily  ?- Continue Atorva 80 mg  ?- no ? blocker yet with recent low output HF.  ?- Would benefit from CR, but no insurance. ?  ?3. Type 2 Diabetes  ?- Not on meds PTA, not followed by PCP.   ?- Hgb A1c  11.5 ?- Has new patient appt with PCP next week. ?  ? 4. HLD, LDL goal < 70  ?- LDL 102 ?- Continue atorva 80 ?- Needs f/u FLP and HFTs in 6 wks. ?  ?Follow up with APP in 3-4 weeks (add Toprol) and 3-4 months with Dr. Haroldine Laws + echo. ?

## 2021-11-06 ENCOUNTER — Telehealth (HOSPITAL_COMMUNITY): Payer: Self-pay

## 2021-11-06 DIAGNOSIS — I5022 Chronic systolic (congestive) heart failure: Secondary | ICD-10-CM

## 2021-11-06 NOTE — Telephone Encounter (Signed)
Labs ordered.

## 2021-11-06 NOTE — Telephone Encounter (Addendum)
Pt aware, agreeable, and verbalized understanding ? ? ?Lab scheduled for Monday 04/03 ? ?----- Message from Rafael Bihari, FNP sent at 11/03/2021  8:04 AM EDT ----- ?Dig mildly elevated. Please repeat as a trough ?

## 2021-11-10 ENCOUNTER — Ambulatory Visit (HOSPITAL_COMMUNITY)
Admission: RE | Admit: 2021-11-10 | Discharge: 2021-11-10 | Disposition: A | Discharge status: Home / Self Care | Payer: Medicaid Other | Source: Ambulatory Visit | Attending: Internal Medicine | Admitting: Internal Medicine

## 2021-11-10 DIAGNOSIS — I5022 Chronic systolic (congestive) heart failure: Secondary | ICD-10-CM | POA: Diagnosis not present

## 2021-11-10 LAB — DIGOXIN LEVEL: Digoxin Level: 0.4 ng/mL — ABNORMAL LOW (ref 0.8–2.0)

## 2021-11-14 ENCOUNTER — Other Ambulatory Visit (HOSPITAL_COMMUNITY): Payer: Self-pay

## 2021-11-14 ENCOUNTER — Other Ambulatory Visit: Payer: Self-pay

## 2021-11-14 NOTE — Telephone Encounter (Signed)
Copied from [place] [phone]. Topic: Quick Communication - Rx Refill/Question ?>> Nov 14, 2021 12:19 PM Tessa Lerner A wrote: ?Medication: Rx #: [id number]  ?Insulin Glargine (BASAGLAR KWIKPEN) 100 UNIT/ML [[id number]]  ? ?Has the patient contacted their pharmacy? Yes.  The patient has been directed to contact their PCP ?(Agent: If no, request that the patient contact the pharmacy for the refill. If patient does not wish to contact the pharmacy document the reason why and proceed with request.) ?(Agent: If yes, when and what did the pharmacy advise?) ? ?Preferred Pharmacy (with phone number or street name): [facility] [place], [place] [address] ?Phone: [phone] Fax: [phone] ?Hours: Not open 24 hours ? ?Has the patient been seen for an appointment in the last year OR does the patient have an upcoming appointment? Yes.   ? ?Agent: Please be advised that RX refills may take up to 3 business days. We ask that you follow-up with your pharmacy. ?

## 2021-11-17 NOTE — Telephone Encounter (Signed)
Requested medications are due for refill today.  Should not be. ? ?Requested medications are on the active medications list.  yes ? ?Last refill. 10/17/2021 68m 1 refill ? ?Future visit scheduled.   yes ? ?Notes to clinic.  Has not been seen at [facility]- cancelled previous scheduled appointment. Medication was refilled 3/10/20223 320m1 refill. Rx e written to expired 11/16/2021 - Rx is expired. NO PCP listed. ? ? ? ?Requested Prescriptions  ?Pending Prescriptions Disp Refills  ? Insulin Glargine (BASAGLAR KWIKPEN) 100 UNIT/ML 3 mL 1  ?  Sig: Inject 8 Units into the skin daily.  ?  ? Endocrinology:  Diabetes - Insulins Failed - 11/14/2021  3:42 PM  ?  ?  Failed - HBA1C is between 0 and 7.9 and within 180 days  ?  Hgb A1c MFr Bld  ?Date Value Ref Range Status  ?10/09/2021 11.5 (H) 4.8 - 5.6 % Final  ?  Comment:  ?  (NOTE) ?Pre diabetes:          5.7%-6.4% ? ?Diabetes:              >6.4% ? ?Glycemic control for   <7.0% ?adults with diabetes ?  ?  ?  ?  ?  Failed - Valid encounter within last 6 months  ?  Recent Outpatient Visits   ?None ?  ?  ?Future Appointments   ? ?        ? In 1 week [place]? ?  ? ?  ?  ?  ?  ?

## 2021-11-19 ENCOUNTER — Other Ambulatory Visit (HOSPITAL_COMMUNITY): Payer: Self-pay

## 2021-11-21 ENCOUNTER — Other Ambulatory Visit: Payer: Self-pay

## 2021-11-21 ENCOUNTER — Other Ambulatory Visit (HOSPITAL_COMMUNITY): Payer: Self-pay

## 2021-11-25 ENCOUNTER — Ambulatory Visit (HOSPITAL_COMMUNITY)
Admission: RE | Admit: 2021-11-25 | Discharge: 2021-11-25 | Disposition: A | Discharge status: Home / Self Care | Payer: Medicaid Other | Source: Ambulatory Visit | Attending: Cardiology | Admitting: Cardiology

## 2021-11-25 ENCOUNTER — Encounter (HOSPITAL_COMMUNITY): Payer: Self-pay

## 2021-11-25 ENCOUNTER — Other Ambulatory Visit (HOSPITAL_COMMUNITY): Payer: Self-pay

## 2021-11-25 VITALS — BP 124/76 | HR 77 | Wt 99.8 lb

## 2021-11-25 DIAGNOSIS — I2510 Atherosclerotic heart disease of native coronary artery without angina pectoris: Secondary | ICD-10-CM | POA: Insufficient documentation

## 2021-11-25 DIAGNOSIS — Z7982 Long term (current) use of aspirin: Secondary | ICD-10-CM | POA: Insufficient documentation

## 2021-11-25 DIAGNOSIS — E785 Hyperlipidemia, unspecified: Secondary | ICD-10-CM | POA: Insufficient documentation

## 2021-11-25 DIAGNOSIS — Z7902 Long term (current) use of antithrombotics/antiplatelets: Secondary | ICD-10-CM | POA: Diagnosis not present

## 2021-11-25 DIAGNOSIS — I110 Hypertensive heart disease with heart failure: Secondary | ICD-10-CM | POA: Diagnosis not present

## 2021-11-25 DIAGNOSIS — Z794 Long term (current) use of insulin: Secondary | ICD-10-CM | POA: Insufficient documentation

## 2021-11-25 DIAGNOSIS — E119 Type 2 diabetes mellitus without complications: Secondary | ICD-10-CM | POA: Insufficient documentation

## 2021-11-25 DIAGNOSIS — I5022 Chronic systolic (congestive) heart failure: Secondary | ICD-10-CM | POA: Diagnosis not present

## 2021-11-25 LAB — BASIC METABOLIC PANEL WITH GFR
Anion gap: 7 (ref 5–15)
BUN: 8 mg/dL (ref 6–20)
CO2: 28 mmol/L (ref 22–32)
Calcium: 9.5 mg/dL (ref 8.9–10.3)
Chloride: 103 mmol/L (ref 98–111)
Creatinine, Ser: 0.65 mg/dL (ref 0.44–1.00)
GFR, Estimated: >60 mL/min
Glucose, Bld: 255 mg/dL — ABNORMAL HIGH (ref 70–99)
Potassium: 4.3 mmol/L (ref 3.5–5.1)
Sodium: 138 mmol/L (ref 135–145)

## 2021-11-25 MED ORDER — DIGOXIN 125 MCG PO TABS
0.0625 mg | ORAL_TABLET | Freq: Every day | ORAL | 3 refills | Status: DC
  Filled 2021-11-25: qty 45, 90d supply, fill #0

## 2021-11-25 MED ORDER — METOPROLOL SUCCINATE ER 25 MG PO TB24
12.5000 mg | ORAL_TABLET | Freq: Every day | ORAL | 3 refills | Status: DC
  Filled 2021-11-25: qty 45, 90d supply, fill #0

## 2021-11-25 NOTE — Progress Notes (Signed)
? ?ADVANCED HF CLINIC PROGRESS NOTE ? ? ?Primary Care: Freeman Caldron, PA-C ?HF Cardiologist: Dr. Haroldine Laws ? ?Reason for Visit: F/u for Chronic Systolic Heart Failure  ? ?HPI: ?Brooke [name] is a 61 y.o. female w/ DM2, chronic pelvic floor dysfunction, protein calorie malnutrition, and new diagnosis of CAD and systolic heart failure/iCM. ? ?No routine preventative care. Has not seen a PCP nor been on medications for > 2 years due to issues w/ her insurance.  ?  ?Admitted 3/23 with acute CHF and cardiogenic shock. Chest CT negative for PE, + for moderate b/l pleural effusions and multivessel coronary artery calcifications. She was started on empiric milrinone and diuresed with IV lasix. Echo showed severe biventricular dysfunction, LVEF 20-25%, GIIDD, RV moderately reduced, trivial MR. Underwent R/LHC showing severe multivessel CAD with severe LV dysfunction, low filling pressures and low output. Milrinone increased and AHF consulted. Not CABG candidate with poor targets, malnutrition and LV systolic dysfunction. Cardiac MRI showed LVEF 24% with < 50% wall thickness LGE in all areas except apex. Coronary anatomy not favorable for PCI. Milrinone weaned off and GDMT titrated. Insulin started with A1C 11.5. Discharged home, weight 100 lbs. ? ?We have been gradually triturating meds post hospital. Last visit, digoxin level was elevated at 0.9 but repeat trough was ok at 0.4. She was supposed to continue digoxin but mistakenly discontinued all together.  ? ?She presents to clinic today for f/u. Here w/ husband. Denies CP but notes some mild exertional fatigue and dyspnea, NYHA class II. Denies resting dyspnea. No orthopnea/PND, LEE. No palpitations. No syncope/near syncope. Reports full med compliance. BP ok 124/76, though  pressures have been a bit lower at home, upper 90s-low 100s at times.  ? ?She has f/u w/ her PCP tomorrow.  ? ?Cardiac Studies: ?- Echo (3/23): severe biventricular dysfunction, LVEF 20-25%, GIIDD,  RV moderately reduced, trivial MR. ? ?- R/LHC (3/23): severe multivessel CAD with severe LV dysfunction, low filling pressures and low output. ?  Prox RCA lesion is 100% stenosed. ?  Ost LM lesion is 60% stenosed. ?  Mid LAD lesion is 80% stenosed. ?  2nd Diag lesion is 85% stenosed. ?  1st Diag lesion is 99% stenosed. ?  Prox LAD lesion is 80% stenosed. ?  Mid Cx lesion is 70% stenosed. ?  The left ventricular ejection fraction is 25-35% by visual estimate. ?  ?1.  Severe multivessel disease. ?2.  Severe LV dysfunction ?3.  Fick cardiac output of 5.2 L/min and Fick cardiac index of 3.4 L a minute per meter squared with a PA saturation of 68%. ?4.  Thermodilution outputs done in triplicate demonstrated a cardiac output of 3 L/min and an index of 2 L/min/m?; milrinone was increased to 2.5 mcg per kilogram per minute. ?5.  Cardiac hemodynamics as follows: ?  ?A.  Right atrial pressure 9/7 with a mean of 5 mmHg ?B.  RV 38/3 with an end-diastolic pressure of 18 mmHg ?C.  Wedge pressure of 12/11 with a mean of 10 mmHg ?D.  Pulmonary artery pressure of 23/12 with a mean pressure of 17 mmHg; PAPI =9/5=1.8 ?E.  LVEDP of 30mHg. ? ?- Cardiac MRI (3/23): showed LVEF 24% with < 50% wall thickness LGE in all areas except apex.  ? ?Review of Systems: [y] = yes, '[ ]'$  = no  ? ?General: Weight gain '[ ]'$ ; Weight loss '[ ]'$ ; Anorexia [Blue.Reese]; Fatigue [ Y]; Fever '[ ]'$ ; Chills '[ ]'$ ; Weakness [Blue.Reese]  ?Cardiac: Chest pain/pressure '[ ]'$ ;  Resting SOB '[ ]'$ ; Exertional SOB [ Y]; Orthopnea '[ ]'$ ; Pedal Edema '[ ]'$ ; Palpitations '[ ]'$ ; Syncope '[ ]'$ ; Presyncope '[ ]'$ ; Paroxysmal nocturnal dyspnea'[ ]'$   ?Pulmonary: Cough '[ ]'$ ; Wheezing'[ ]'$ ; Hemoptysis'[ ]'$ ; Sputum '[ ]'$ ; Snoring '[ ]'$   ?GI: Vomiting'[ ]'$ ; Dysphagia'[ ]'$ ; Melena'[ ]'$ ; Hematochezia '[ ]'$ ; Heartburn'[ ]'$ ; Abdominal pain '[ ]'$ ; Constipation '[ ]'$ ; Diarrhea '[ ]'$ ; BRBPR '[ ]'$   ?GU: Hematuria'[ ]'$ ; Dysuria '[ ]'$ ; Nocturia'[ ]'$   ?Vascular: Pain in legs with walking '[ ]'$ ; Pain in feet with lying flat '[ ]'$ ; Non-healing sores '[ ]'$ ; Stroke '[ ]'$ ;  TIA '[ ]'$ ; Slurred speech '[ ]'$ ;  ?Neuro: Headaches'[ ]'$ ; Vertigo'[ ]'$ ; Seizures'[ ]'$ ; Paresthesias'[ ]'$ ;Blurred vision '[ ]'$ ; Diplopia '[ ]'$ ; Vision changes '[ ]'$   ?Ortho/Skin: Arthritis '[ ]'$ ; Joint pain '[ ]'$ ; Muscle pain Blue.Reese ]; Joint swelling '[ ]'$ ; Back Pain '[ ]'$ ; Rash '[ ]'$   ?Psych: Depression'[ ]'$ ; Anxiety'[ ]'$   ?Heme: Bleeding problems '[ ]'$ ; Clotting disorders '[ ]'$ ; Anemia '[ ]'$   ?Endocrine: Diabetes [ y]; Thyroid dysfunction'[ ]'$  ? ?Past Medical History:  ?Diagnosis Date  ? Abdominal pain, left lower quadrant 12/17/2015  ? Chest pain, unspecified 10/05/2003  ? Depressive disorder, not elsewhere classified 07/31/2005  ? Diabetes mellitus without complication ([place]) 41/28/7867  ? Endometriosis 09/14/2012  ? Hypertension 11/23/2003  ? Unspecified constipation 06/24/2004  ? ?Current Outpatient Medications  ?Medication Sig Dispense Refill  ? acetaminophen (TYLENOL) 500 MG tablet Take 1,000 mg by mouth every 6 (six) hours as needed for mild pain, fever or headache.    ? ALPRAZolam (XANAX) 0.25 MG tablet Take 1 tablet (0.25 mg total) by mouth daily as needed for anxiety. 30 tablet 0  ? aspirin 81 MG chewable tablet Chew 1 tablet (81 mg total) by mouth daily. 30 tablet 1  ? atorvastatin (LIPITOR) 80 MG tablet Take 1 tablet (80 mg total) by mouth daily. 30 tablet 3  ? clopidogrel (PLAVIX) 75 MG tablet Take 1 tablet (75 mg total) by mouth daily. 30 tablet 3  ? dapagliflozin propanediol (FARXIGA) 10 MG TABS tablet Take 1 tablet (10 mg total) by mouth daily. 30 tablet 3  ? diazepam (VALIUM) 5 MG tablet Place 1 tablet vaginally nightly as needed for muscle spasm/ pelvic pain. 30 tablet 0  ? Insulin Glargine (BASAGLAR KWIKPEN) 100 UNIT/ML Inject 8 Units into the skin daily. 3 mL 1  ? Insulin Pen Needle (PENTIPS) 32G X 4 MM MISC Use as directed with insulin pen 100 each 0  ? sacubitril-valsartan (ENTRESTO) 49-51 MG Take 1 tablet by mouth 2 (two) times daily. 180 tablet 3  ? spironolactone (ALDACTONE) 25 MG tablet Take 1 tablet (25 mg total) by mouth daily. 30  tablet 3  ? vitamin B-12 (CYANOCOBALAMIN) 1000 MCG tablet Take 3,000 mcg by mouth daily.    ? ?No current facility-administered medications for this encounter.  ? ?Allergies  ?Allergen Reactions  ? Aspirin   ?  Intolerance (aspirin sensitive stomach)  ? Darvocet [Propoxyphene N-Acetaminophen]   ? Erythromycin   ? Gabapentin Other (See Comments)  ?  Suicidal ideations   ? Ibuprofen   ? Metformin And Related Nausea Only  ? ?Social History  ? ?Socioeconomic History  ? Marital status: Married  ?  Spouse name: Not on file  ? Number of children: Not on file  ? Years of education: Not on file  ? Highest education level: Not on file  ?Occupational History  ? Not on file  ?Tobacco Use  ? Smoking  status: Never  ? Smokeless tobacco: Never  ?Vaping Use  ? Vaping Use: Never used  ?Substance and Sexual Activity  ? Alcohol use: No  ? Drug use: No  ? Sexual activity: Not Currently  ?Other Topics Concern  ? Not on file  ?Social History Narrative  ? Not on file  ? ?Social Determinants of Health  ? ?Financial Resource Strain: Medium Risk  ? Difficulty of Paying Living Expenses: Somewhat hard  ?Food Insecurity: No Food Insecurity  ? Worried About Charity fundraiser in the Last Year: Never true  ? Ran Out of Food in the Last Year: Never true  ?Transportation Needs: No Transportation Needs  ? Lack of Transportation (Medical): No  ? Lack of Transportation (Non-Medical): No  ?Physical Activity: Not on file  ?Stress: Not on file  ?Social Connections: Not on file  ?Intimate Partner Violence: Not on file  ? ?Family History  ?Problem Relation Age of Onset  ? Diabetes Mother   ? Hypertension Mother   ? Arthritis Mother   ?     RA  ? Pancreatic cancer Father   ? Stomach cancer Neg Hx   ? Colon cancer Neg Hx   ? Esophageal cancer Neg Hx   ? Rectal cancer Neg Hx   ? ?BP 124/76   Pulse 77   Wt 45.3 kg (99 lb 12.8 oz)   LMP 11/16/2014 (Approximate)   SpO2 99%   BMI 16.87 kg/m?  ? ?Wt Readings from Last 3 Encounters:  ?11/25/21 45.3 kg (99 lb  12.8 oz)  ?10/31/21 45.7 kg (100 lb 12.8 oz)  ?10/17/21 46.8 kg (103 lb 3.2 oz)  ? ?PHYSICAL EXAM: ?General:  thin/cachetic appearing. No respiratory difficulty ?HEENT: normal ?Neck: supple. no JVD. Carotids 2+

## 2021-11-25 NOTE — Patient Instructions (Signed)
RESTART Toprol XL 12.5 mg one tab daily ?RESTART Digoxin 0.0625 mg (one half tab) daily ? ?Labs today ?We will only contact you if something comes back abnormal or we need to make some changes. ?Otherwise no news is good news! ? ?Labs needed in 7-10 days ? ?Your physician recommends that you schedule a follow-up appointment in: 3 weeks with the pharmacy team or  in the [place] (PA/NP) Clinic  ? ? ?Do the following things EVERYDAY: ?Weigh yourself in the morning before breakfast. Write it down and keep it in a log. ?Take your medicines as prescribed ?Eat low salt foods--Limit salt (sodium) to 2000 mg per day.  ?Stay as active as you can everyday ?Limit all fluids for the day to less than 2 liters ? ?At the [facility], you and your health needs are our priority. As part of our continuing mission to provide you with exceptional heart care, we have created designated Provider Care Teams. These Care Teams include your primary Cardiologist (physician) and Advanced Practice Providers (APPs- Physician Assistants and Nurse Practitioners) who all work together to provide you with the care you need, when you need it.  ? ?You may see any of the following providers on your designated Care Team at your next follow up: ?Dr Glori Bickers ?Dr Loralie Champagne ?Darrick Grinder, NP ?Lyda Jester, PA ?Jessica Milford,NP ?Marlyce Huge, PA ?Audry Riles, PharmD ? ? ?Please be sure to bring in all your medications bottles to every appointment.  ? ?If you have any questions or concerns before your next appointment please send [place] a message through [place] or call our office at [phone].   ? ?TO LEAVE A MESSAGE FOR THE NURSE SELECT OPTION 2, PLEASE LEAVE A MESSAGE INCLUDING: ?YOUR NAME ?DATE OF BIRTH ?CALL BACK NUMBER ?REASON FOR CALL**this is important as we prioritize the call backs ? ?YOU WILL RECEIVE A CALL BACK THE SAME DAY AS LONG AS YOU CALL BEFORE 4:00 PM ? ? ?

## 2021-11-26 ENCOUNTER — Other Ambulatory Visit (HOSPITAL_COMMUNITY): Payer: Self-pay

## 2021-11-26 ENCOUNTER — Other Ambulatory Visit (HOSPITAL_COMMUNITY): Payer: Self-pay | Admitting: Pharmacist

## 2021-11-26 MED ORDER — DIGOXIN 125 MCG PO TABS
0.0625 mg | ORAL_TABLET | Freq: Every day | ORAL | 3 refills | Status: DC
  Filled 2021-11-26: qty 15, 30d supply, fill #0
  Filled 2021-12-15: qty 15, 30d supply, fill #1
  Filled 2022-01-15: qty 15, 30d supply, fill #2
  Filled 2022-02-19: qty 15, 30d supply, fill #3

## 2021-11-26 MED ORDER — METOPROLOL SUCCINATE ER 25 MG PO TB24
12.5000 mg | ORAL_TABLET | Freq: Every day | ORAL | 3 refills | Status: DC
  Filled 2021-11-26: qty 15, 30d supply, fill #0
  Filled 2021-12-15 (×2): qty 15, 30d supply, fill #1
  Filled 2022-01-15: qty 15, 30d supply, fill #2

## 2021-11-26 NOTE — Progress Notes (Signed)
Patient ID: Brooke [name], female   DOB: [birth year], 61 y.o.   MRN: [id number] ? ? ? ?Brooke [name], is a 61 y.o. female ? ?BOF:[id number] ? ?DPO:[id number] ? ?DOB - [birth year] ? ?Chief Complaint  ?Patient presents with  ? Hospitalization Follow-up  ?    ? ?Subjective:  ? ?Brooke [name] is a 61 y.o. female here today for a follow up visit and to establish care  After hospitalization 3/1-3/05/2022.  She is being seen by CHF/vascular 11/25/2021.  She is on basaglar 8 units daily-blood sugars running from 90s-300.  States compliant with meds.  Some blurry vision as adjusting to blood sugars lowering.  Was not taking any DM PTA. ? ?Also c/o progressive L leg weakness and lower back pain over the last several years.  Her L leg won't move at times or it will just slip out from under her.  Distribution of sciatic nerve.  NKI to back ? ?From discharge summary ?Primary Discharge Diagnoses:  ?Acute Biventricular Heart Failure>>Low output ?Ischemic Cardiomyopathy  ?Severe Multivessel CAD, Medically Managed  ?Poorly Controlled Type 2DM  ?Hyperlipidemia  ?Severe protein calorie malnutrition ?  ?  ?Hospital Course:  ?  ?61 y/o woman as above with h/o DM2 (no follow up), chronic malnutrition and pelvic floor dysfunction. No known h/o heart disease. ?  ?Presented to ER with CP and progressive SOB.Marland Kitchen Found to be in cardiogenic shock with lactic acid 4.0 and biventricular dysfunction on echo EF 20-25. Hs trop 44. Ecg non-acute ?  ?Started on milrinone and taken to cath lab. Cath with severely calcified 3v CAD including 60% LM, CTO RCA, pLAD 80% with diffuse severely calcified disease throughout vessel. Hemodynamic improved on milrinone. We discussed in cath lab and mechanical support not felt to be needed. She was continued on milrinone and GDMT added. Diuresed w/ IV Lasix.  ?  ?CT surgery was consulted but pt felt not a good candidate for CABG given diffusely diseased coronaries w/ no good targets and poorly controlled DM. Angiograms  were reviewed w/ IC team and also felt not a candidate for PCI. cMRI with evidence of viability but no good targets for PCI. Medical therapy recommended. She was placed on ASA, Plavix and high dose statin therapy. Diabetes coordinator consulted and started insulin regimen.  ?  ?After diuresis, she was weaned off milrinone. GDMT titrated. She had no further CP  ?  ?F/u in HF clinic arranged at discharge. Referred to community health and wellness. ? ?From 10/31/2021 visit with heart and vascular: ?ASSESSMENT & PLAN: ?1. Chronic Biventricular Heart Failure. ?- New/iCM  ?- Echo (3/23): EF 20-25%, RV moderately reduced. ?- LHC (3/23): w/ severe diffuse MVCAD. ?- RHC (3/23): on milrinone 0.25 w/ low filling pressures and low CO/CI by thermo, CO 3L/min, CI 2L/m?Marland Kitchen PAPI 1.8. Milrinone increased to 0.5.  ?- cMRI (3/23): EF 24%, RVEF 48%, coronary disease type LGE present, LGE < 50% wall thickness in all areas except apex.  ?- NYHA II today, volume looks good. GDMT limited by low BP. ?- Continue Entresto 49-51 mg bid.  ?- Continue spironolactone 25 mg daily.  ?- Continue Farxiga 10 mg daily.   ?- Continue digoxin 0.125 mg daily. ?- No ? blocker yet. ?- BMET and dig level today. ?- Echo in 3-4 months after GDMT optimized. ?  ?2. CAD ?- diffuse MV CAD ?- Prox RCA lesion is 100% stenosed. (RPAV filled by collaterals from Dist Cx) ?Colon [place] LM lesion is 60% stenosed. ?- Mid LAD lesion is 80% stenosed. ?-  2nd Diag lesion is 85% stenosed. ?- 1st Diag lesion is 99% stenosed. ?- Prox LAD lesion is 80% stenosed. ?- Mid Cx lesion is 70% stenosed. ?- Not CABG candidate with poor targets, malnutrition, marked LV systolic dysfunction.  ?- Reviewed with IC team. Not candidate for PCI at this point ?- cMRI (3/23): LVEF 24% with < 50% wall thickness LGE in all areas except apex. Coronary anatomy not favorable for PCI ?- No further chest pain. ?- Continue Plavix.  ?- Continue ASA 81 mg daily  ?- Continue Atorva 80 mg  ?- no ? blocker yet with  recent low output HF.  ?- Would benefit from CR, but no insurance. ?  ?3. Type 2 Diabetes  ?- Not on meds PTA, not followed by PCP.   ?- Hgb A1c 11.5 ?- Has new patient appt with PCP next week. ?  ? 4. HLD, LDL goal < 70  ?- LDL 102 ?- Continue atorva 80 ?- Needs f/u FLP and HFTs in 6 wks. ?  ?Follow up with APP in 3-4 weeks (add Toprol) and 3-4 months with Dr. Haroldine Laws + echo. ? ? ?. Patient has No headache, No chest pain, No abdominal pain - No Nausea, No cough/SOB.  Weights staying stable ? ?No problems updated. ? ?ALLERGIES: ?Allergies  ?Allergen Reactions  ? Aspirin   ?  Intolerance (aspirin sensitive stomach)  ? Darvocet [Propoxyphene N-Acetaminophen]   ? Erythromycin   ? Gabapentin Other (See Comments)  ?  Suicidal ideations   ? Ibuprofen   ? Metformin And Related Nausea Only  ? ? ?PAST MEDICAL HISTORY: ?Past Medical History:  ?Diagnosis Date  ? Abdominal pain, left lower quadrant 12/17/2015  ? Chest pain, unspecified 10/05/2003  ? Depressive disorder, not elsewhere classified 07/31/2005  ? Diabetes mellitus without complication ([place]) 03/55/9741  ? Endometriosis 09/14/2012  ? Hypertension 11/23/2003  ? Unspecified constipation 06/24/2004  ? ? ?MEDICATIONS AT HOME: ?Prior to Admission medications   ?Medication Sig Start Date End Date Taking? Authorizing Provider  ?acetaminophen (TYLENOL) 500 MG tablet Take 1,000 mg by mouth every 6 (six) hours as needed for mild pain, fever or headache.   Yes [provider]  ?aspirin 81 MG chewable tablet Chew 1 tablet (81 mg total) by mouth daily. 10/18/21  Yes Joette Catching, PA-C  ?atorvastatin (LIPITOR) 80 MG tablet Take 1 tablet (80 mg total) by mouth daily. 10/31/21  Yes Milford, Maricela Bo, FNP  ?clopidogrel (PLAVIX) 75 MG tablet Take 1 tablet (75 mg total) by mouth daily. 10/31/21  Yes Milford, Maricela Bo, FNP  ?dapagliflozin propanediol (FARXIGA) 10 MG TABS tablet Take 1 tablet (10 mg total) by mouth daily. 10/31/21  Yes Milford, Maricela Bo, FNP  ?digoxin  (LANOXIN) 0.125 MG tablet Take 0.5 tablets (0.0625 mg total) by mouth daily. 11/26/21  Yes Consuelo Pandy, PA-C  ?Insulin Pen Needle (PENTIPS) 32G X 4 MM MISC Use as directed with insulin pen 10/17/21  Yes Almyra Deforest, PA  ?metoprolol succinate (TOPROL-XL) 25 MG 24 hr tablet Take 0.5 tablets (12.5 mg total) by mouth daily. Take with or immediately following a meal. 11/26/21  Yes Simmons, Brittainy M, PA-C  ?sacubitril-valsartan (ENTRESTO) 49-51 MG Take 1 tablet by mouth 2 (two) times daily. 10/21/21  Yes Bensimhon, Shaune Pascal, MD  ?spironolactone (ALDACTONE) 25 MG tablet Take 1 tablet (25 mg total) by mouth daily. 10/31/21  Yes Milford, Maricela Bo, FNP  ?vitamin B-12 (CYANOCOBALAMIN) 1000 MCG tablet Take 3,000 mcg by mouth daily.   Yes [provider]  ?ALPRAZolam (XANAX) 0.25 MG tablet Take 1 tablet (0.25 mg total) by mouth daily as needed for anxiety. 06/28/20   [name], MD  ?diazepam (VALIUM) 5 MG tablet Place 1 tablet vaginally nightly as needed for muscle spasm/ pelvic pain. 03/17/21   Jaquita Folds, MD  ?Insulin Glargine [facility]) 100 UNIT/ML Inject 10 Units into the skin daily. 11/27/21 12/27/21  Argentina Donovan, PA-C  ? ? ?ROS: ?Neg HEENT ?Neg resp ?Neg cardiac ?Neg GI ?Neg GU ?Neg psych ?Neg neuro ? ?Objective:  ? ?Vitals:  ? 11/27/21 1500  ?BP: 122/80  ?Pulse: 83  ?SpO2: 99%  ?Weight: 100 lb 3.2 oz (45.5 kg)  ? ?Exam ?General appearance : Awake, alert, not in any distress. Speech Clear. Not toxic looking ?HEENT: Atraumatic and Normocephalic ?Neck: Supple, no JVD. No cervical lymphadenopathy.  ?Chest: Good air entry bilaterally, CTAB.  No rales/rhonchi/wheezing ?CVS: S1 S2 regular, no murmurs.  ?Back-no spiny TTP.  +SLR on L.  Neg SLR on R.  Patellar reflex absent on L.  Other BLE in tact ?Extremities: B/L Lower Ext shows no edema, both legs are warm to touch ?Neurology: Awake alert, and oriented X 3, CN II-XII intact, Non focal ?Skin: No Rash ? ?Data Review ?Lab Results   ?Component Value Date  ? HGBA1C 11.5 (H) 10/09/2021  ? HGBA1C 11.3 (H) 11/30/2016  ? HGBA1C 11.0 (H) 06/29/2016  ? ? ?Assessment & Plan  ? ?1. Type 2 diabetes mellitus with hyperglycemia, unspecified whether long

## 2021-11-27 ENCOUNTER — Encounter: Payer: Self-pay | Admitting: Physician Assistant

## 2021-11-27 ENCOUNTER — Other Ambulatory Visit: Payer: Self-pay

## 2021-11-27 ENCOUNTER — Ambulatory Visit: Payer: Medicaid Other | Attending: Physician Assistant | Admitting: Physician Assistant

## 2021-11-27 VITALS — BP 122/80 | HR 83 | Wt 100.2 lb

## 2021-11-27 DIAGNOSIS — I110 Hypertensive heart disease with heart failure: Secondary | ICD-10-CM | POA: Insufficient documentation

## 2021-11-27 DIAGNOSIS — Z7902 Long term (current) use of antithrombotics/antiplatelets: Secondary | ICD-10-CM | POA: Diagnosis not present

## 2021-11-27 DIAGNOSIS — Z79899 Other long term (current) drug therapy: Secondary | ICD-10-CM | POA: Diagnosis not present

## 2021-11-27 DIAGNOSIS — Z7982 Long term (current) use of aspirin: Secondary | ICD-10-CM | POA: Diagnosis not present

## 2021-11-27 DIAGNOSIS — I2510 Atherosclerotic heart disease of native coronary artery without angina pectoris: Secondary | ICD-10-CM | POA: Diagnosis not present

## 2021-11-27 DIAGNOSIS — E43 Unspecified severe protein-calorie malnutrition: Secondary | ICD-10-CM | POA: Diagnosis not present

## 2021-11-27 DIAGNOSIS — E1165 Type 2 diabetes mellitus with hyperglycemia: Secondary | ICD-10-CM | POA: Diagnosis not present

## 2021-11-27 DIAGNOSIS — I959 Hypotension, unspecified: Secondary | ICD-10-CM | POA: Diagnosis not present

## 2021-11-27 DIAGNOSIS — H538 Other visual disturbances: Secondary | ICD-10-CM | POA: Diagnosis not present

## 2021-11-27 DIAGNOSIS — Z09 Encounter for follow-up examination after completed treatment for conditions other than malignant neoplasm: Secondary | ICD-10-CM | POA: Diagnosis not present

## 2021-11-27 DIAGNOSIS — E119 Type 2 diabetes mellitus without complications: Secondary | ICD-10-CM | POA: Diagnosis not present

## 2021-11-27 DIAGNOSIS — I10 Essential (primary) hypertension: Secondary | ICD-10-CM | POA: Diagnosis not present

## 2021-11-27 DIAGNOSIS — R292 Abnormal reflex: Secondary | ICD-10-CM | POA: Diagnosis not present

## 2021-11-27 DIAGNOSIS — R29898 Other symptoms and signs involving the musculoskeletal system: Secondary | ICD-10-CM

## 2021-11-27 DIAGNOSIS — M5416 Radiculopathy, lumbar region: Secondary | ICD-10-CM

## 2021-11-27 DIAGNOSIS — E785 Hyperlipidemia, unspecified: Secondary | ICD-10-CM

## 2021-11-27 DIAGNOSIS — I5082 Biventricular heart failure: Secondary | ICD-10-CM

## 2021-11-27 DIAGNOSIS — Z7984 Long term (current) use of oral hypoglycemic drugs: Secondary | ICD-10-CM | POA: Diagnosis not present

## 2021-11-27 LAB — GLUCOSE, POCT (MANUAL RESULT ENTRY): POC Glucose: 281 mg/dl — AB (ref 70–99)

## 2021-11-27 MED ORDER — BASAGLAR KWIKPEN 100 UNIT/ML ~~LOC~~ SOPN
10.0000 [IU] | PEN_INJECTOR | Freq: Every day | SUBCUTANEOUS | 1 refills | Status: DC
  Filled 2021-11-27: qty 3, 30d supply, fill #0
  Filled 2021-12-23: qty 3, 30d supply, fill #1

## 2021-11-27 NOTE — Patient Instructions (Signed)
Check blood sugars fasting and bedtime and record and bring to visit in 3 weeks ?

## 2021-12-02 ENCOUNTER — Ambulatory Visit (HOSPITAL_COMMUNITY)
Admission: RE | Admit: 2021-12-02 | Discharge: 2021-12-02 | Disposition: A | Discharge status: Home / Self Care | Payer: Medicaid Other | Source: Ambulatory Visit | Attending: Cardiology | Admitting: Cardiology

## 2021-12-02 ENCOUNTER — Other Ambulatory Visit: Payer: Self-pay

## 2021-12-02 DIAGNOSIS — I5022 Chronic systolic (congestive) heart failure: Secondary | ICD-10-CM | POA: Diagnosis not present

## 2021-12-02 LAB — BASIC METABOLIC PANEL
Anion gap: 6 (ref 5–15)
BUN: 9 mg/dL (ref 6–20)
CO2: 28 mmol/L (ref 22–32)
Calcium: 9.4 mg/dL (ref 8.9–10.3)
Chloride: 103 mmol/L (ref 98–111)
Creatinine, Ser: 0.73 mg/dL (ref 0.44–1.00)
GFR, Estimated: >60 mL/min (ref >60)
Glucose, Bld: 257 mg/dL — ABNORMAL HIGH (ref 70–99)
Potassium: 4.5 mmol/L (ref 3.5–5.1)
Sodium: 137 mmol/L (ref 135–145)

## 2021-12-02 LAB — DIGOXIN LEVEL: Digoxin Level: 0.3 ng/mL — ABNORMAL LOW (ref 0.8–2.0)

## 2021-12-03 NOTE — Progress Notes (Incomplete)
***In Progress*** ? ?  ?Advanced Heart Failure Clinic Note  ? ?Primary Care: Freeman Caldron, PA-C ?HF Cardiologist: Dr. Haroldine Laws ? ?HPI:  ?Brooke [name] is a 61 y.o. female w/ DM2, chronic pelvic floor dysfunction, protein calorie malnutrition, and new diagnosis of CAD and systolic heart failure/iCM. ?  ?No routine preventative care. Has not seen a PCP nor been on medications for > 2 years due to issues w/ her insurance.  ?  ?Admitted 10/2021 with acute CHF and cardiogenic shock. Chest CT negative for PE, + for moderate b/l pleural effusions and multivessel coronary artery calcifications. She was started on empiric milrinone and diuresed with IV Lasix. Echo showed severe biventricular dysfunction, LVEF 20-25%, GIIDD, RV moderately reduced, trivial MR. Underwent R/LHC showing severe multivessel CAD with severe LV dysfunction, low filling pressures and low output. Milrinone increased and AHF consulted. Not CABG candidate with poor targets, malnutrition and LV systolic dysfunction. Cardiac MRI showed LVEF 24% with < 50% wall thickness LGE in all areas except apex. Coronary anatomy not favorable for PCI. Milrinone weaned off and GDMT titrated. Insulin started with A1C 11.5. Discharged home, weight 100 lbs. ?  ?Gradually titrating medications post hospital. Last visit 10/31/21, digoxin level was elevated at 0.9 but repeat trough was ok at 0.4. She was supposed to continue digoxin but mistakenly discontinued.  ?  ?She presented to AHF Clinic for follow up 11/25/21. Came with her husband. Denied CP but noted some mild exertional fatigue and dyspnea, NYHA class II. Denied resting dyspnea. No orthopnea/PND, LEE. No palpitations. No syncope/near syncope. Reported full medication compliance. BP was ok 124/76, though pressures were running a bit lower at home, upper 90s-low 100s at times.  ? ?Today she returns to HF clinic for pharmacist medication titration. At last visit with APP, digoxin 0.0625 mg daily was restarted and  metoprolol succinate 12.5 mg daily was initiated.  ? ?Shortness of breath/dyspnea on exertion? {YES NO:22349}  ?Orthopnea/PND? {YES NO:22349} ?Edema? {YES NO:22349} ?Lightheadedness/dizziness? {YES NO:22349} ?Daily weights at home? {YES NO:22349} ?Blood pressure/heart rate monitoring at home? {YES NO:22349} ?Following low-sodium/fluid-restricted diet? {YES NO:22349} ? ?HF Medications: ?Metoprolol succinate 12.5 mg daily ?Entresto 49/51 mg BID ?Spironolactone 25 mg daily ?Farxiga 10 mg daily ?Digoxin 0.0625 mg daily ? ?Has the patient been experiencing any side effects to the medications prescribed?  {YES NO:22349} ? ?Does the patient have any problems obtaining medications due to transportation or finances?   {YES NO:22349} ? ?Understanding of regimen: {excellent/good/fair/poor:19665} ?Understanding of indications: {excellent/good/fair/poor:19665} ?Potential of compliance: {excellent/good/fair/poor:19665} ?Patient understands to avoid NSAIDs. ?Patient understands to avoid decongestants. ?  ? ?Pertinent Lab Values: ?Serum creatinine ***, BUN ***, Potassium ***, Sodium ***, BNP ***, Magnesium ***, Digoxin ***  ? ?Vital Signs: ?Weight: *** (last clinic weight: ***) ?Blood pressure: ***  ?Heart rate: ***  ? ?Assessment/Plan: ?1. Chronic Biventricular Heart Failure. ?- New/iCM  ?- Echo (10/2021): EF 20-25%, RV moderately reduced. ?- LHC (10/2021): w/ severe diffuse MVCAD. ?- RHC (10/2021): on milrinone 0.25 w/ low filling pressures and low CO/CI by thermo, CO 3L/min, CI 2L/m?Marland Kitchen PAPI 1.8. Milrinone increased to 0.5.  ?- cMRI (10/2021): EF 24%, RVEF 48%, coronary disease type LGE present, LGE < 50% wall thickness in all areas except apex.  ?- NYHA II. Euvolemic on exam ?- Continue metoprolol succinate 12.5 mg daily ?- Continue Entresto 49-51 mg BID.  ?- Continue spironolactone 25 mg daily.  ?- Continue Farxiga 10 mg daily.   ?- Continue Digoxin 0.0625 mg daily.  ?- Echo in 3-4 months  after GDMT optimized. ?  ?2. CAD ?- diffuse  MV CAD ?- Prox RCA lesion is 100% stenosed. (RPAV filled by collaterals from Dist Cx) ?Colon Flattery LM lesion is 60% stenosed. ?- Mid LAD lesion is 80% stenosed. ?- 2nd Diag lesion is 85% stenosed. ?- 1st Diag lesion is 99% stenosed. ?- Prox LAD lesion is 80% stenosed. ?- Mid Cx lesion is 70% stenosed. ?- Not CABG candidate with poor targets, malnutrition, marked LV systolic dysfunction.  ?- Reviewed with IC team. Not candidate for PCI at this point ?- cMRI (10/2021): LVEF 24% with < 50% wall thickness LGE in all areas except apex. Coronary anatomy not favorable for PCI ?- Stable w/o CP ?- Continue Plavix 75 mg daily  ?- Continue ASA 81 mg daily  ?- Continue Atorvastatin 80 mg daily ?- Continue metoprolol succinate 12.5 mg daily.  ?- Would benefit from CR, but no insurance. ?  ?3. Type 2 Diabetes  ?- Not on meds PTA, not followed by PCP.   ?- Hgb A1c 11.5 ?- Has established w/ new PCP  ?  ? 4. HLD, LDL goal < 70  ?- LDL 102 ?- Continue atorvastatin 80 mg daily ?- Needs f/u FLP and HFTs in 4 more wks  ? ?Follow up *** ? ? ?Audry Riles, PharmD, BCPS, BCCP, CPP ?Heart Failure Clinic Pharmacist ?[phone] ?  ?

## 2021-12-12 ENCOUNTER — Telehealth (HOSPITAL_COMMUNITY): Payer: Self-pay

## 2021-12-12 NOTE — Telephone Encounter (Signed)
Pt insurance is active through [place]. ZPS#[id number] ?  ?Will fax over [facility] Reimbursement form to Dr. Haroldine Laws ?  ?Will contact pt to see if she is interested in the Cardiac Rehab program. ?

## 2021-12-12 NOTE — Telephone Encounter (Signed)
Called patient to see if she is interested in the Cardiac Rehab Program. Patient expressed interest. Explained scheduling process and went over insurance, patient verbalized understanding. Will contact patient for scheduling once Medicaid form is recv'ed back. ?

## 2021-12-15 ENCOUNTER — Other Ambulatory Visit (HOSPITAL_COMMUNITY): Payer: Self-pay

## 2021-12-15 ENCOUNTER — Telehealth (HOSPITAL_COMMUNITY): Payer: Self-pay | Admitting: Licensed Clinical Social Worker

## 2021-12-15 ENCOUNTER — Other Ambulatory Visit (HOSPITAL_COMMUNITY): Payer: Self-pay | Admitting: Physician Assistant

## 2021-12-15 DIAGNOSIS — M5416 Radiculopathy, lumbar region: Secondary | ICD-10-CM | POA: Diagnosis not present

## 2021-12-15 MED ORDER — ASPIRIN 81 MG PO CHEW
81.0000 mg | CHEWABLE_TABLET | Freq: Every day | ORAL | 1 refills | Status: AC
  Filled 2021-12-15: qty 30, 30d supply, fill #0
  Filled 2022-01-15: qty 30, 30d supply, fill #1

## 2021-12-15 NOTE — Telephone Encounter (Signed)
Patient called to inquire about her entresto application process stating she is not sure who to return paperwork to in the clinic. CSW returned call and left message for patient to bring to pharmacy appointment tomorrow 12-16-21. Raquel Sarna, [place], [place] ?  ?

## 2021-12-16 ENCOUNTER — Other Ambulatory Visit (HOSPITAL_COMMUNITY): Payer: Self-pay

## 2021-12-16 ENCOUNTER — Telehealth (HOSPITAL_COMMUNITY): Payer: Self-pay | Admitting: Pharmacy Technician

## 2021-12-16 ENCOUNTER — Inpatient Hospital Stay (HOSPITAL_COMMUNITY): Admission: RE | Admit: 2021-12-16 | Payer: Self-pay | Source: Ambulatory Visit

## 2021-12-16 MED ORDER — SACUBITRIL-VALSARTAN 49-51 MG PO TABS
1.0000 | ORAL_TABLET | Freq: Two times a day (BID) | ORAL | 3 refills | Status: DC
Start: 2021-12-16 — End: 2022-03-10
  Filled 2021-12-16: qty 60, 30d supply, fill #0
  Filled 2022-02-11 (×2): qty 60, 30d supply, fill #1

## 2021-12-16 NOTE — Telephone Encounter (Signed)
Patient called in. Now she has traditional medicaid. Should not need assistance after PA is approved. ? ?Charlann Boxer, CPhT ? ?

## 2021-12-16 NOTE — Telephone Encounter (Signed)
Patient Advocate Encounter ?  ?Received notification from Medicaid that prior authorization for Wilder Glade is required. ?  ?PA submitted on [employer] ?Key [id number] ?Status is pending ?  ?Will continue to follow. ? ?

## 2021-12-16 NOTE — Telephone Encounter (Signed)
Advanced Heart Failure Patient Advocate Encounter ? ?Prior Authorization for Delene Loll has been submitted and approved through [employer].   ? ?PA# [id number] ?Effective dates: 12/16/21 through 12/16/22 ? ?Spoke with patient. Sent 90 day RX request to Enoch (CMA) to send to [facility] outpatient. ? ?Charlann Boxer, CPhT ?  ? ?

## 2021-12-17 NOTE — Telephone Encounter (Signed)
Advanced Heart Failure Patient Advocate Encounter ? ?Prior Authorization for Wilder Glade has been approved.   ? ?PA# [id number] ?Effective dates: 12/16/21 through 12/16/22 ? ?Charlann Boxer, CPhT ? ? ?

## 2021-12-18 DIAGNOSIS — M48061 Spinal stenosis, lumbar region without neurogenic claudication: Secondary | ICD-10-CM | POA: Diagnosis not present

## 2021-12-18 DIAGNOSIS — M545 Low back pain, unspecified: Secondary | ICD-10-CM | POA: Diagnosis not present

## 2021-12-18 DIAGNOSIS — M5416 Radiculopathy, lumbar region: Secondary | ICD-10-CM | POA: Diagnosis not present

## 2021-12-18 NOTE — Progress Notes (Incomplete)
***In Progress*** ? ?  ?Advanced Heart Failure Clinic Note  ? ?Primary Care: Freeman Caldron, PA-C ?HF Cardiologist: Dr. Haroldine Laws ? ?HPI:  ?Brooke [name] is a 61 y.o. female w/ DM2, chronic pelvic floor dysfunction, protein calorie malnutrition, and new diagnosis of CAD and systolic heart failure/iCM. ?  ?No routine preventative care. Has not seen a PCP nor been on medications for > 2 years due to issues w/ her insurance.  ?  ?Admitted 10/2021 with acute CHF and cardiogenic shock. Chest CT negative for PE, + for moderate b/l pleural effusions and multivessel coronary artery calcifications. She was started on empiric milrinone and diuresed with IV Lasix. Echo showed severe biventricular dysfunction, LVEF 20-25%, GIIDD, RV moderately reduced, trivial MR. Underwent R/LHC showing severe multivessel CAD with severe LV dysfunction, low filling pressures and low output. Milrinone increased and AHF consulted. Not CABG candidate with poor targets, malnutrition and LV systolic dysfunction. Cardiac MRI showed LVEF 24% with < 50% wall thickness LGE in all areas except apex. Coronary anatomy not favorable for PCI. Milrinone weaned off and GDMT titrated. Insulin started with A1C 11.5. Discharged home, weight 100 lbs. ?  ?Gradually titrating medications post hospital. Last visit 10/31/21, digoxin level was elevated at 0.9 but repeat trough was ok at 0.4. She was supposed to continue digoxin but mistakenly discontinued.  ?  ?She presented to AHF Clinic for follow up 11/25/21. Came with her husband. Denied CP but noted some mild exertional fatigue and dyspnea, NYHA class II. Denied resting dyspnea. No orthopnea/PND, LEE. No palpitations. No syncope/near syncope. Reported full medication compliance. BP was ok 124/76, though pressures were running a bit lower at home, upper 90s-low 100s at times.  ? ?Today she returns to HF clinic for pharmacist medication titration. At last visit with APP, digoxin 0.0625 mg daily was restarted and  metoprolol succinate 12.5 mg daily was initiated.  ? ?Shortness of breath/dyspnea on exertion? {YES NO:22349}  ?Orthopnea/PND? {YES NO:22349} ?Edema? {YES NO:22349} ?Lightheadedness/dizziness? {YES NO:22349} ?Daily weights at home? {YES NO:22349} ?Blood pressure/heart rate monitoring at home? {YES NO:22349} ?Following low-sodium/fluid-restricted diet? {YES NO:22349} ? ?HF Medications: ?Metoprolol succinate 12.5 mg daily ?Entresto 49/51 mg BID ?Spironolactone 25 mg daily ?Farxiga 10 mg daily ?Digoxin 0.0625 mg daily ? ?Has the patient been experiencing any side effects to the medications prescribed?  {YES NO:22349} ? ?Does the patient have any problems obtaining medications due to transportation or finances?   {YES NO:22349} ? ?Understanding of regimen: {excellent/good/fair/poor:19665} ?Understanding of indications: {excellent/good/fair/poor:19665} ?Potential of compliance: {excellent/good/fair/poor:19665} ?Patient understands to avoid NSAIDs. ?Patient understands to avoid decongestants. ?  ? ?Pertinent Lab Values: ?Serum creatinine ***, BUN ***, Potassium ***, Sodium ***, BNP ***, Magnesium ***, Digoxin ***  ? ?Vital Signs: ?Weight: *** (last clinic weight: ***) ?Blood pressure: ***  ?Heart rate: ***  ? ?Assessment/Plan: ?1. Chronic Biventricular Heart Failure. ?- New/iCM  ?- Echo (10/2021): EF 20-25%, RV moderately reduced. ?- LHC (10/2021): w/ severe diffuse MVCAD. ?- RHC (10/2021): on milrinone 0.25 w/ low filling pressures and low CO/CI by thermo, CO 3L/min, CI 2L/m?Marland Kitchen PAPI 1.8. Milrinone increased to 0.5.  ?- cMRI (10/2021): EF 24%, RVEF 48%, coronary disease type LGE present, LGE < 50% wall thickness in all areas except apex.  ?- NYHA II. Euvolemic on exam ?- Continue metoprolol succinate 12.5 mg daily ?- Continue Entresto 49-51 mg BID.  ?- Continue spironolactone 25 mg daily.  ?- Continue Farxiga 10 mg daily.   ?- Continue Digoxin 0.0625 mg daily.  ?- Echo in 3-4 months  after GDMT optimized. ?  ?2. CAD ?- diffuse  MV CAD ?- Prox RCA lesion is 100% stenosed. (RPAV filled by collaterals from Dist Cx) ?Colon Flattery LM lesion is 60% stenosed. ?- Mid LAD lesion is 80% stenosed. ?- 2nd Diag lesion is 85% stenosed. ?- 1st Diag lesion is 99% stenosed. ?- Prox LAD lesion is 80% stenosed. ?- Mid Cx lesion is 70% stenosed. ?- Not CABG candidate with poor targets, malnutrition, marked LV systolic dysfunction.  ?- Reviewed with IC team. Not candidate for PCI at this point ?- cMRI (10/2021): LVEF 24% with < 50% wall thickness LGE in all areas except apex. Coronary anatomy not favorable for PCI ?- Stable w/o CP ?- Continue Plavix 75 mg daily  ?- Continue ASA 81 mg daily  ?- Continue Atorvastatin 80 mg daily ?- Continue metoprolol succinate 12.5 mg daily.  ?- Would benefit from CR, but no insurance. ?  ?3. Type 2 Diabetes  ?- Not on meds PTA, not followed by PCP.   ?- Hgb A1c 11.5 ?- Has established w/ new PCP  ?  ? 4. HLD, LDL goal < 70  ?- LDL 102 ?- Continue atorvastatin 80 mg daily ?- Needs f/u FLP and HFTs in 4 more wks  ? ?Follow up *** ? ? ?Audry Riles, PharmD, BCPS, BCCP, CPP ?Heart Failure Clinic Pharmacist ?[phone] ?

## 2021-12-23 ENCOUNTER — Other Ambulatory Visit: Payer: Self-pay

## 2021-12-24 ENCOUNTER — Inpatient Hospital Stay (HOSPITAL_COMMUNITY): Admission: RE | Admit: 2021-12-24 | Payer: Self-pay | Source: Ambulatory Visit

## 2021-12-25 DIAGNOSIS — Z681 Body mass index (BMI) 19 or less, adult: Secondary | ICD-10-CM | POA: Diagnosis not present

## 2021-12-25 DIAGNOSIS — M5126 Other intervertebral disc displacement, lumbar region: Secondary | ICD-10-CM | POA: Diagnosis not present

## 2022-01-06 ENCOUNTER — Telehealth (HOSPITAL_COMMUNITY): Payer: Self-pay

## 2022-01-06 NOTE — Telephone Encounter (Signed)
Called and spoke with pt in regards to CR, pt stated she is not interested at this time. She is having issues with her left legs, if anything changes will contact CR at a later date.   Closed referral

## 2022-01-12 ENCOUNTER — Telehealth (HOSPITAL_COMMUNITY): Payer: Self-pay

## 2022-01-12 NOTE — Telephone Encounter (Signed)
Fax received for cardiac clearance from Dr. Feliberto Harts and [place] for planned LESI-left-L4-L5 x 2 procedure. Per Dr. Haroldine Laws confirmed to hold plavix for 7 days prior to procedure and resume plavix the day after procedure. Successful fax transmission returned today to [phone].

## 2022-01-13 ENCOUNTER — Ambulatory Visit: Payer: Medicaid Other | Admitting: Pharmacist

## 2022-01-13 ENCOUNTER — Inpatient Hospital Stay (HOSPITAL_COMMUNITY): Admission: RE | Admit: 2022-01-13 | Payer: Self-pay | Source: Ambulatory Visit

## 2022-01-13 DIAGNOSIS — M5416 Radiculopathy, lumbar region: Secondary | ICD-10-CM | POA: Diagnosis not present

## 2022-01-13 DIAGNOSIS — Z7901 Long term (current) use of anticoagulants: Secondary | ICD-10-CM | POA: Diagnosis not present

## 2022-01-15 ENCOUNTER — Other Ambulatory Visit (HOSPITAL_COMMUNITY): Payer: Self-pay

## 2022-01-17 NOTE — Progress Notes (Incomplete)
***In Progress***    Advanced Heart Failure Clinic Note   Primary Care: Freeman Caldron, PA-C HF Cardiologist: Dr. Haroldine Laws   HPI:  Brooke [name] is a 61 y.o. female w/ DM2, chronic pelvic floor dysfunction, protein calorie malnutrition, CAD, and systolic heart failure/iCM.   No routine preventative care. Has not seen a PCP nor been on medications for > 2 years due to issues w/ her insurance prior to establishing with our clinic.    Admitted 10/2021 with acute CHF and cardiogenic shock. Chest CT negative for PE, + for moderate b/l pleural effusions and multivessel coronary artery calcifications. She was started on empiric milrinone and diuresed with IV lasix. Echo showed severe biventricular dysfunction, LVEF 20-25%, GIIDD, RV moderately reduced, trivial MR. Underwent R/LHC showing severe multivessel CAD with severe LV dysfunction, low filling pressures and low output. Milrinone increased and AHF consulted. Not CABG candidate with poor targets, malnutrition and LV systolic dysfunction. Cardiac MRI showed LVEF 24% with < 50% wall thickness LGE in all areas except apex. Coronary anatomy not favorable for PCI. Milrinone weaned off and GDMT titrated. Insulin started with A1C 11.5%. Discharged home, weight 100 lbs.   We have been gradually triturating meds post hospital. At her 3/24/203 appointment, digoxin level was elevated at 0.9 but repeat trough was ok at 0.4. She was supposed to continue digoxin but mistakenly discontinued all together.    She presents to clinic on 11/25/2021  for follow-up with her husband. Denied CP but noted some mild exertional fatigue and dyspnea, NYHA class II. Denied resting dyspnea, orthopnea, PND, LEE, palpitations, and syncope/near syncope. Reported full med compliance. BP was ok at 124/76, though  pressures had been a bit lower at home in the upper 90s-low 100s at times.    Today she returns to HF clinic for pharmacist medication titration. At last visit with PA she was  restarted on lower dose digoxin (62.5 mcg) daily and initiated on metoprolol succinate 12.5 mg daily. Digoxin level was 0.3 at follow-up.   She is planning on having LESI-left=L4-L5 x 2 procedure.   Overall feeling ***. Dizziness, lightheadedness, fatigue:  Chest pain or palpitations:  How is your breathing?: *** SOB: Able to complete all ADLs. Activity level ***  Weight at home pounds. Does not take a diuretic.  LEE PND/Orthopnea  Appetite *** Low-salt diet:   Physical Exam Cost/affordability of meds   HF Medications: Metoprolol succinate 12.5 mg daily Entresto 49/51 mg BID Spironolactone 25 mg daily Farxiga 10 mg daily Digoxin 0.0625 mg daily   Has the patient been experiencing any side effects to the medications prescribed?  {YES NO:22349}  Does the patient have any problems obtaining medications due to transportation or finances?   {YES NO:22349} Medicaid insurance Psychologist, clinical assistance   Understanding of regimen: {excellent/good/fair/poor:19665} Understanding of indications: {excellent/good/fair/poor:19665} Potential of compliance: {excellent/good/fair/poor:19665} Patient understands to avoid NSAIDs. Patient understands to avoid decongestants.    Pertinent Lab Values: Labs 12/02/2021: Serum creatinine 0.73, BUN 9, Potassium 4.5, Sodium 137, Digoxin 0.3   Vital Signs: Weight: *** (last clinic weight: 99.8 lbs) Blood pressure: *** 122/80 Heart rate: *** 77-83  Plan No labs, recheck A1c and LDL/LFTs next time gets labs A. Metoprolol to 25 mg daily B. Entresto to 97/103 mg BID - labs 1 week   Assessment/Plan: 1. Chronic Biventricular Heart Failure. - New/iCM  - Echo (10/2021): EF 20-25%, RV moderately reduced. - LHC (10/2021): w/ severe diffuse MVCAD. - RHC (10/2021): on milrinone 0.25 w/ low filling pressures and low  CO/CI by thermo, CO 3L/min, CI 2L/m. PAPI 1.8. Milrinone increased to 0.5.  - cMRI (10/2021): EF 24%, RVEF 48%, coronary disease  type LGE present, LGE < 50% wall thickness in all areas except apex.  - NYHA II. Euvolemic on exam *** -Continue metoprolol succinate 12.5 mg daily - Continue Entresto 49-51 mg BID.  - Continue spironolactone 25 mg daily.  - Continue Farxiga 10 mg daily.   - Continue Digoxin 0.0625 mg daily  - Echo in 3-4 months after GDMT optimized.   2. CAD - diffuse MV CAD - Prox RCA lesion is 100% stenosed. (RPAV filled by collaterals from Dist Cx) - Ost LM lesion is 60% stenosed. - Mid LAD lesion is 80% stenosed. - 2nd Diag lesion is 85% stenosed. - 1st Diag lesion is 99% stenosed. - Prox LAD lesion is 80% stenosed. - Mid Cx lesion is 70% stenosed. - Not CABG candidate with poor targets, malnutrition, marked LV systolic dysfunction.  - Reviewed with IC team. Not candidate for PCI at this point - cMRI (10/2021): LVEF 24% with < 50% wall thickness LGE in all areas except apex. Coronary anatomy not favorable for PCI - Stable w/o CP - Continue Plavix 75 mg daily  - Continue ASA 81 mg daily  - Continue Atorva 80 mg  - Add low dose Toprol XL, 12.5 mg daily. *** - Would benefit from CR, but patient deferred due to back issues.    3. Type 2 Diabetes  - Not on meds PTA, not followed by PCP.   - Hgb A1c 11.5 - Has established w/ new PCP     4. HLD, LDL goal < 70  - LDL 102 - Continue atorvastatin 80 mg daily - Needs f/u FLP and HFTs in 4 more wks    Continue gradual med titration until fully optimized q2-3 wks. Can see APP or pharmD next. F/u 3-4 months with Dr. Haroldine Laws + echo.  Follow up *** 3 weeks with APP/pharmD for med titration x1 before echo since missed 2 apts 1 month with MD for echo? Needs visit scheduled    Audry Riles, PharmD, BCPS, BCCP, CPP Heart Failure Clinic Pharmacist [phone]

## 2022-01-19 ENCOUNTER — Other Ambulatory Visit: Payer: Self-pay | Admitting: Physician Assistant

## 2022-01-19 ENCOUNTER — Other Ambulatory Visit (HOSPITAL_COMMUNITY): Payer: Self-pay

## 2022-01-19 ENCOUNTER — Telehealth (HOSPITAL_COMMUNITY): Payer: Self-pay | Admitting: Pharmacist

## 2022-01-19 ENCOUNTER — Encounter (HOSPITAL_COMMUNITY): Payer: Self-pay

## 2022-01-19 ENCOUNTER — Ambulatory Visit (HOSPITAL_COMMUNITY)
Admission: RE | Admit: 2022-01-19 | Discharge: 2022-01-19 | Disposition: A | Discharge status: Home / Self Care | Payer: Medicaid Other | Source: Ambulatory Visit | Attending: Internal Medicine | Admitting: Internal Medicine

## 2022-01-19 ENCOUNTER — Other Ambulatory Visit: Payer: Self-pay

## 2022-01-19 VITALS — BP 112/64 | HR 88 | Wt 100.2 lb

## 2022-01-19 DIAGNOSIS — E785 Hyperlipidemia, unspecified: Secondary | ICD-10-CM | POA: Diagnosis not present

## 2022-01-19 DIAGNOSIS — I2510 Atherosclerotic heart disease of native coronary artery without angina pectoris: Secondary | ICD-10-CM | POA: Diagnosis not present

## 2022-01-19 DIAGNOSIS — Z7901 Long term (current) use of anticoagulants: Secondary | ICD-10-CM | POA: Diagnosis not present

## 2022-01-19 DIAGNOSIS — E1165 Type 2 diabetes mellitus with hyperglycemia: Secondary | ICD-10-CM

## 2022-01-19 DIAGNOSIS — Z7902 Long term (current) use of antithrombotics/antiplatelets: Secondary | ICD-10-CM | POA: Insufficient documentation

## 2022-01-19 DIAGNOSIS — E118 Type 2 diabetes mellitus with unspecified complications: Secondary | ICD-10-CM | POA: Diagnosis not present

## 2022-01-19 DIAGNOSIS — Z79899 Other long term (current) drug therapy: Secondary | ICD-10-CM | POA: Diagnosis not present

## 2022-01-19 DIAGNOSIS — E46 Unspecified protein-calorie malnutrition: Secondary | ICD-10-CM | POA: Insufficient documentation

## 2022-01-19 DIAGNOSIS — I5022 Chronic systolic (congestive) heart failure: Secondary | ICD-10-CM

## 2022-01-19 DIAGNOSIS — Z7982 Long term (current) use of aspirin: Secondary | ICD-10-CM | POA: Insufficient documentation

## 2022-01-19 DIAGNOSIS — I5082 Biventricular heart failure: Secondary | ICD-10-CM | POA: Diagnosis not present

## 2022-01-19 DIAGNOSIS — Z7984 Long term (current) use of oral hypoglycemic drugs: Secondary | ICD-10-CM | POA: Diagnosis not present

## 2022-01-19 MED ORDER — METOPROLOL SUCCINATE ER 25 MG PO TB24
25.0000 mg | ORAL_TABLET | Freq: Every day | ORAL | 3 refills | Status: DC
  Filled 2022-01-19 – 2022-02-07 (×4): qty 90, 90d supply, fill #0

## 2022-01-19 NOTE — Patient Instructions (Signed)
It was a pleasure seeing you today!  MEDICATIONS: -We are changing your medications today -Increase metoprolol succinate to 25 mg (1 tablet) daily -Call if you have questions about your medications.  NEXT APPOINTMENT: Return to clinic in 02/20/2022 with NP/PA.  In general, to take care of your heart failure: -Limit your fluid intake to 2 Liters (half-gallon) per day.   -Limit your salt intake to ideally 2-3 grams (2000-3000 mg) per day. -Weigh yourself daily and record, and bring that "weight diary" to your next appointment.  (Weight gain of 2-3 pounds in 1 day typically means fluid weight.) -The medications for your heart are to help your heart and help you live longer.   -Please contact us before stopping any of your heart medications.  Call the clinic at [phone] with questions or to reschedule future appointments.

## 2022-01-19 NOTE — Progress Notes (Signed)
Advanced Heart Failure Clinic Note   Primary Care: Freeman Caldron, PA-C HF Cardiologist: Dr. Haroldine Laws   HPI:  Brooke [name] is a 61 y.o. female w/ DM2, chronic pelvic floor dysfunction, protein calorie malnutrition, CAD, and systolic heart failure/iCM.   No routine preventative care. Has not seen a PCP nor been on medications for > 2 years due to issues w/ her insurance prior to establishing with our clinic.    Admitted 10/2021 with acute CHF and cardiogenic shock. Chest CT negative for PE, + for moderate b/l pleural effusions and multivessel coronary artery calcifications. She was started on empiric milrinone and diuresed with IV lasix. Echo showed severe biventricular dysfunction, LVEF 20-25%, GIIDD, RV moderately reduced, trivial MR. Underwent R/LHC showing severe multivessel CAD with severe LV dysfunction, low filling pressures and low output. Milrinone increased and AHF consulted. Not CABG candidate with poor targets, malnutrition and LV systolic dysfunction. Cardiac MRI showed LVEF 24% with < 50% wall thickness LGE in all areas except apex. Coronary anatomy not favorable for PCI. Milrinone weaned off and GDMT titrated. Insulin started with A1C 11.5%. Discharged home, weight 100 lbs.   We have been gradually titrating meds post hospital. At her 10/31/2021 appointment, digoxin level was elevated at 0.9 but repeat trough was ok at 0.4. She was supposed to continue digoxin but mistakenly discontinued all together.    She presented to clinic on 11/25/2021  for follow-up with her husband. Denied CP but noted some mild exertional fatigue and dyspnea, NYHA class II. Denied resting dyspnea, orthopnea, PND, LEE, palpitations, and syncope/near syncope. Reported full med compliance. BP was ok at 124/76, though  pressures had been a bit lower at home in the upper 90s-low 100s at times.    Today she returns to HF clinic for pharmacist medication titration. At last visit with PA she was restarted on lower  dose digoxin (0.0625 mg daily) and initiated on metoprolol succinate 12.5 mg daily. Digoxin level was 0.3 at follow-up. Overall she is feeling pretty good other than her legs. Her energy level is good overall, she does occasionally get very tired and lays down when this happens. She does describe orthostatic hypotension about once a week in the morning when getting out of bed despite getting up slowly. Otherwise she denies dizziness, lightheadedness, chest pain or palpitations. Her breathing has been fine. She denies SOB, but has been limited with her leg issue. She is able to complete the 12 steps up to her house without issue and all ADLs. Her weight at home has been 95-98 lbs. She weighs almost everyday, but sometimes misses a day as she has been depressed when she sees a low number as she is actively trying to gain weight. She does not take a diuretic. No LEE on exam. Denies PND or orthopnea. Her appetite has been so-so and she is not very hungry. She is adherent to a low-salt diet and has been drinking 8-10 bottles of water a day. She has been out of her Wilder Glade since 01/16/2022 as the pharmacy told her a PA was needed, but is otherwise taking all medications as prescribed. A new PA was completed today due to change in insurance coverage.    HF Medications: Metoprolol succinate 12.5 mg daily Entresto 49/51 mg BID Spironolactone 25 mg daily Farxiga 10 mg daily Digoxin 0.0625 mg daily   Has the patient been experiencing any side effects to the medications prescribed? No  Does the patient have any problems obtaining medications due to transportation  or finances? No BCBS Medicaid insurance [place] approved [place] PA approved  Understanding of regimen: excellent Understanding of indications: fair Potential of compliance: good Patient understands to avoid NSAIDs. Patient understands to avoid decongestants.    Pertinent Lab Values: Labs 12/02/2021: Serum creatinine 0.73, BUN 9, Potassium 4.5,  Sodium 137, Digoxin 0.3   Vital Signs: Weight: 100.2 lbs (last clinic weight: 99.8 lbs) Blood pressure: 112/64 Heart rate: 88   Assessment/Plan: 1. Chronic Biventricular Heart Failure. - New/iCM  - Echo (10/2021): EF 20-25%, RV moderately reduced. - LHC (10/2021): w/ severe diffuse MVCAD. - RHC (10/2021): on milrinone 0.25 w/ low filling pressures and low CO/CI by thermo, CO 3L/min, CI 2L/m. PAPI 1.8. Milrinone increased to 0.5.  - cMRI (10/2021): EF 24%, RVEF 48%, coronary disease type LGE present, LGE < 50% wall thickness in all areas except apex.  - NYHA II. Euvolemic on exam  -Increase metoprolol succinate to 25 mg daily - Continue Entresto 49-51 mg BID.  - Continue spironolactone 25 mg daily.  - Continue Farxiga 10 mg daily.   - Continue Digoxin 0.0625 mg daily - Echo when available after GDMT optimized.   2. CAD - diffuse MV CAD - Prox RCA lesion is 100% stenosed. (RPAV filled by collaterals from Dist Cx) - Ost LM lesion is 60% stenosed. - Mid LAD lesion is 80% stenosed. - 2nd Diag lesion is 85% stenosed. - 1st Diag lesion is 99% stenosed. - Prox LAD lesion is 80% stenosed. - Mid Cx lesion is 70% stenosed. - Not CABG candidate with poor targets, malnutrition, marked LV systolic dysfunction.  - Reviewed with IC team. Not candidate for PCI at this point - cMRI (10/2021): LVEF 24% with < 50% wall thickness LGE in all areas except apex. Coronary anatomy not favorable for PCI - Stable w/o CP - Continue Plavix 75 mg daily  - Continue ASA 81 mg daily  - Continue atorvastatin 80 mg  - Increase metoprolol to 25 mg daily - Would benefit from CR, but patient deferred due to back issues.    3. Type 2 Diabetes  - Not on meds PTA, not followed by PCP.   - Hgb A1c 11.5 - Has established w/ new PCP     4. HLD, LDL goal < 70  - LDL 102 - Continue atorvastatin 80 mg daily - Needs f/u FLP and HFTs in 4 more wks   Follow up with NP/PA 02/20/2022 and with Dr. Haroldine Laws for echo  05/01/22.  Audry Riles, PharmD, BCPS, BCCP, CPP Heart Failure Clinic Pharmacist [phone]

## 2022-01-19 NOTE — Telephone Encounter (Signed)
Advanced Heart Failure Patient Advocate Encounter  Prior Authorization for Wilder Glade has been approved.    PA# [id number] Effective dates: 01/19/22 through 01/19/23  Audry Riles, PharmD, BCPS, BCCP, CPP Heart Failure Clinic Pharmacist [phone]

## 2022-01-20 ENCOUNTER — Other Ambulatory Visit: Payer: Self-pay

## 2022-01-20 MED ORDER — BASAGLAR KWIKPEN 100 UNIT/ML ~~LOC~~ SOPN
10.0000 [IU] | PEN_INJECTOR | Freq: Every day | SUBCUTANEOUS | 1 refills | Status: DC
  Filled 2022-01-20: qty 3, 30d supply, fill #0

## 2022-01-20 NOTE — Telephone Encounter (Signed)
Requested medications are due for refill today.  yes  Requested medications are on the active medications list.  yes  Last refill. 11/27/2021 5m 1rf  Future visit scheduled.   yes  Notes to clinic.  No PCP listed.     Requested Prescriptions  Pending Prescriptions Disp Refills   Insulin Glargine (BASAGLAR KWIKPEN) 100 UNIT/ML 3 mL 1    Sig: Inject 10 Units into the skin daily.     Endocrinology:  Diabetes - Insulins Failed - 01/19/2022 12:57 PM      Failed - HBA1C is between 0 and 7.9 and within 180 days    Hgb A1c MFr Bld  Date Value Ref Range Status  10/09/2021 11.5 (H) 4.8 - 5.6 % Final    Comment:    (NOTE) Pre diabetes:          5.7%-6.4%  Diabetes:              >6.4%  Glycemic control for   <7.0% adults with diabetes          Passed - Valid encounter within last 6 months    Recent Outpatient Visits           1 month ago Type 2 diabetes mellitus with hyperglycemia, unspecified whether long term insulin use ([facility]   [place]      Future Appointments             In 1 week [place]  In 2 months NCharlott Rakes MD [place]

## 2022-01-21 ENCOUNTER — Other Ambulatory Visit: Payer: Self-pay | Admitting: Pharmacist

## 2022-01-21 ENCOUNTER — Other Ambulatory Visit: Payer: Self-pay

## 2022-01-21 MED ORDER — INSULIN GLARGINE SOLOSTAR 100 UNIT/ML ~~LOC~~ SOPN
10.0000 [IU] | PEN_INJECTOR | Freq: Every day | SUBCUTANEOUS | 2 refills | Status: DC
  Filled 2022-01-21: qty 3, 28d supply, fill #0
  Filled 2022-02-20: qty 3, 28d supply, fill #1

## 2022-01-27 ENCOUNTER — Other Ambulatory Visit: Payer: Self-pay

## 2022-01-30 ENCOUNTER — Ambulatory Visit: Payer: Medicaid Other | Admitting: Pharmacist

## 2022-02-04 ENCOUNTER — Telehealth (HOSPITAL_COMMUNITY): Payer: Self-pay | Admitting: Licensed Clinical Social Worker

## 2022-02-04 DIAGNOSIS — I5022 Chronic systolic (congestive) heart failure: Secondary | ICD-10-CM

## 2022-02-04 NOTE — Telephone Encounter (Signed)
Order placed for Care navigation Raquel Sarna, [place], [place]

## 2022-02-04 NOTE — Telephone Encounter (Signed)
CSW received call from patient requesting assistance with obtaining incontinence supplies. Patient states she has medicaid and inquiring if covered benefit. CSW encouraged patient to follow up with PCP  although she is in the midst of switching providers. Patient has an upcoming appointment at [facility] and will follow up to confirm. In the meantime, CSW has contacted a possible company to supply requested items.   CSW left message for Alba Cory at [facility] [phone] and left message.   CSW will follow up with patient when contact made and information is provided. Patient verbalizes understanding. Raquel Sarna, [place], [place]

## 2022-02-05 ENCOUNTER — Other Ambulatory Visit (HOSPITAL_COMMUNITY): Payer: Self-pay

## 2022-02-06 ENCOUNTER — Telehealth (HOSPITAL_COMMUNITY): Payer: Self-pay | Admitting: Licensed Clinical Social Worker

## 2022-02-06 NOTE — Telephone Encounter (Signed)
Entered in error

## 2022-02-06 NOTE — Telephone Encounter (Signed)
CSW contacted patient to share that there is a possible company to assist with incontinence supplies covered under medicaid . Patient instructed to contact her PCP to discuss further and request prescription. Patient verbalizes understanding and will follow up. Raquel Sarna, [place], [place]

## 2022-02-07 ENCOUNTER — Other Ambulatory Visit (HOSPITAL_COMMUNITY): Payer: Self-pay

## 2022-02-11 ENCOUNTER — Other Ambulatory Visit (HOSPITAL_COMMUNITY): Payer: Self-pay

## 2022-02-11 ENCOUNTER — Telehealth (HOSPITAL_COMMUNITY): Payer: Self-pay | Admitting: Pharmacy Technician

## 2022-02-11 NOTE — Telephone Encounter (Addendum)
Advanced Heart Failure Patient Advocate Encounter  Prior Authorization for Delene Loll has been approved.    PA# [id number] Effective dates: 02/11/22 through 02/11/23  Called and spoke with the patient.   Charlann Boxer, CPhT

## 2022-02-11 NOTE — Telephone Encounter (Signed)
Patient Advocate Encounter   Received notification from [facility] that prior authorization for Brooke [name] is required.   PA submitted on CoverMyMeds Key  [facility]  Status is pending   Will continue to follow.

## 2022-02-12 ENCOUNTER — Other Ambulatory Visit (HOSPITAL_COMMUNITY): Payer: Self-pay

## 2022-02-19 ENCOUNTER — Other Ambulatory Visit (HOSPITAL_COMMUNITY): Payer: Self-pay

## 2022-02-19 DIAGNOSIS — Z681 Body mass index (BMI) 19 or less, adult: Secondary | ICD-10-CM | POA: Diagnosis not present

## 2022-02-19 DIAGNOSIS — M5416 Radiculopathy, lumbar region: Secondary | ICD-10-CM | POA: Diagnosis not present

## 2022-02-20 ENCOUNTER — Other Ambulatory Visit: Payer: Self-pay

## 2022-02-20 ENCOUNTER — Encounter (HOSPITAL_COMMUNITY): Payer: Self-pay

## 2022-02-20 ENCOUNTER — Ambulatory Visit (HOSPITAL_COMMUNITY)
Admission: RE | Admit: 2022-02-20 | Discharge: 2022-02-20 | Disposition: A | Discharge status: Home / Self Care | Payer: Medicaid Other | Source: Ambulatory Visit | Attending: Family Medicine | Admitting: Family Medicine

## 2022-02-20 VITALS — BP 124/70 | HR 83 | Wt 98.2 lb

## 2022-02-20 DIAGNOSIS — E785 Hyperlipidemia, unspecified: Secondary | ICD-10-CM | POA: Diagnosis not present

## 2022-02-20 DIAGNOSIS — E119 Type 2 diabetes mellitus without complications: Secondary | ICD-10-CM

## 2022-02-20 DIAGNOSIS — Z794 Long term (current) use of insulin: Secondary | ICD-10-CM | POA: Diagnosis not present

## 2022-02-20 DIAGNOSIS — I5082 Biventricular heart failure: Secondary | ICD-10-CM | POA: Insufficient documentation

## 2022-02-20 DIAGNOSIS — I5022 Chronic systolic (congestive) heart failure: Secondary | ICD-10-CM

## 2022-02-20 DIAGNOSIS — I110 Hypertensive heart disease with heart failure: Secondary | ICD-10-CM | POA: Insufficient documentation

## 2022-02-20 DIAGNOSIS — I2510 Atherosclerotic heart disease of native coronary artery without angina pectoris: Secondary | ICD-10-CM

## 2022-02-20 DIAGNOSIS — Z7902 Long term (current) use of antithrombotics/antiplatelets: Secondary | ICD-10-CM | POA: Diagnosis not present

## 2022-02-20 DIAGNOSIS — E1165 Type 2 diabetes mellitus with hyperglycemia: Secondary | ICD-10-CM | POA: Diagnosis not present

## 2022-02-20 DIAGNOSIS — Z79899 Other long term (current) drug therapy: Secondary | ICD-10-CM | POA: Diagnosis not present

## 2022-02-20 DIAGNOSIS — E46 Unspecified protein-calorie malnutrition: Secondary | ICD-10-CM

## 2022-02-20 DIAGNOSIS — Z681 Body mass index (BMI) 19 or less, adult: Secondary | ICD-10-CM | POA: Insufficient documentation

## 2022-02-20 HISTORY — DX: Chronic systolic (congestive) heart failure: I50.22

## 2022-02-20 LAB — COMPREHENSIVE METABOLIC PANEL
ALT: 32 U/L (ref 0–44)
AST: 27 U/L (ref 15–41)
Albumin: 3.8 g/dL (ref 3.5–5.0)
Alkaline Phosphatase: 70 U/L (ref 38–126)
Anion gap: 13 (ref 5–15)
BUN: 10 mg/dL (ref 6–20)
CO2: 25 mmol/L (ref 22–32)
Calcium: 9.4 mg/dL (ref 8.9–10.3)
Chloride: 97 mmol/L — ABNORMAL LOW (ref 98–111)
Creatinine, Ser: 0.79 mg/dL (ref 0.44–1.00)
GFR, Estimated: >60 mL/min (ref >60)
Glucose, Bld: 467 mg/dL — ABNORMAL HIGH (ref 70–99)
Potassium: 4.8 mmol/L (ref 3.5–5.1)
Sodium: 135 mmol/L (ref 135–145)
Total Bilirubin: 0.3 mg/dL (ref 0.3–1.2)
Total Protein: 7.4 g/dL (ref 6.5–8.1)

## 2022-02-20 LAB — LIPID PANEL
Cholesterol: 147 mg/dL (ref 0–200)
HDL: 42 mg/dL (ref >40)
LDL Cholesterol: 78 mg/dL (ref 0–99)
Total CHOL/HDL Ratio: 3.5 RATIO
Triglycerides: 134 mg/dL (ref <150)
VLDL: 27 mg/dL (ref 0–40)

## 2022-02-20 NOTE — Progress Notes (Signed)
ADVANCED HF CLINIC PROGRESS NOTE   Primary Care: Freeman Caldron, PA-C HF Cardiologist: Dr. Haroldine Laws  Reason for Visit: F/u for Chronic Systolic Heart Failure   HPI: Brooke [name] is a 61 y.o. female w/ DM2, chronic pelvic floor dysfunction, protein calorie malnutrition, and new diagnosis of CAD and systolic heart failure/iCM.  No routine preventative care. Has not seen a PCP nor been on medications for > 2 years due to issues w/ her insurance.    Admitted 3/23 with acute CHF and cardiogenic shock. Chest CT negative for PE, + for moderate b/l pleural effusions and multivessel coronary artery calcifications. She was started on empiric milrinone and diuresed with IV lasix. Echo showed severe biventricular dysfunction, LVEF 20-25%, GIIDD, RV moderately reduced, trivial MR. Underwent R/LHC showing severe multivessel CAD with severe LV dysfunction, low filling pressures and low output. Milrinone increased and AHF consulted. Not CABG candidate with poor targets, malnutrition and LV systolic dysfunction. Cardiac MRI showed LVEF 24% with < 50% wall thickness LGE in all areas except apex. Coronary anatomy not favorable for PCI. Milrinone weaned off and GDMT titrated. Insulin started with A1C 11.5. Discharged home, weight 100 lbs.  We have been gradually titrating meds post hospital. Last visit, digoxin level was elevated at 0.9 but repeat trough was ok at 0.4. She was supposed to continue digoxin but mistakenly discontinued all together.   Today she returns for HF follow up. Overall feeling fine. Main issue is chronic lower back pain, receiving epidural injections. She is not SOB with ADLs or walking on flat ground. Denies palpitations, CP, dizziness, edema, or PND/Orthopnea. Appetite ok. No fever or chills. Weight at home 98-100 pounds. Taking all medications.     Cardiac Studies: - Echo (3/23): severe biventricular dysfunction, LVEF 20-25%, GIIDD, RV moderately reduced, trivial MR.  - R/LHC  (3/23): severe multivessel CAD with severe LV dysfunction, low filling pressures and low output.   Prox RCA lesion is 100% stenosed.   Ost LM lesion is 60% stenosed.   Mid LAD lesion is 80% stenosed.   2nd Diag lesion is 85% stenosed.   1st Diag lesion is 99% stenosed.   Prox LAD lesion is 80% stenosed.   Mid Cx lesion is 70% stenosed.   The left ventricular ejection fraction is 25-35% by visual estimate.   1.  Severe multivessel disease. 2.  Severe LV dysfunction 3.  Fick cardiac output of 5.2 L/min and Fick cardiac index of 3.4 L a minute per meter squared with a PA saturation of 68%. 4.  Thermodilution outputs done in triplicate demonstrated a cardiac output of 3 L/min and an index of 2 L/min/m; milrinone was increased to 2.5 mcg per kilogram per minute. 5.  Cardiac hemodynamics as follows:   A.  Right atrial pressure 9/7 with a mean of 5 mmHg B.  RV 38/3 with an end-diastolic pressure of 18 mmHg C.  Wedge pressure of 12/11 with a mean of 10 mmHg D.  Pulmonary artery pressure of 23/12 with a mean pressure of 17 mmHg; PAPI =9/5=1.8 E.  LVEDP of 2mHg.  - Cardiac MRI (3/23): showed LVEF 24% with < 50% wall thickness LGE in all areas except apex.   Past Medical History:  Diagnosis Date   Abdominal pain, left lower quadrant 12/17/2015   Chest pain, unspecified 10/05/2003   Depressive disorder, not elsewhere classified 07/31/2005   Diabetes mellitus without complication ([place] 056/[id number]/[id number]  Endometriosis 09/14/2012   Hypertension 11/23/2003   Unspecified constipation 06/24/2004   Current  Outpatient Medications  Medication Sig Dispense Refill   acetaminophen (TYLENOL) 500 MG tablet Take 1,000 mg by mouth every 6 (six) hours as needed for mild pain, fever or headache.     aspirin 81 MG chewable tablet Chew 1 tablet (81 mg total) by mouth daily. 30 tablet 1   atorvastatin (LIPITOR) 80 MG tablet Take 1 tablet (80 mg total) by mouth daily. 30 tablet 3   clopidogrel (PLAVIX) 75 MG tablet  Take 1 tablet (75 mg total) by mouth daily. 30 tablet 3   dapagliflozin propanediol (FARXIGA) 10 MG TABS tablet Take 1 tablet (10 mg total) by mouth daily. 30 tablet 3   digoxin (LANOXIN) 0.125 MG tablet Take 0.5 tablets (0.0625 mg total) by mouth daily. 45 tablet 3   Insulin Glargine Solostar (LANTUS) 100 UNIT/ML Solostar Pen Inject 10 Units into the skin once daily. 3 mL 2   Insulin Pen Needle (PENTIPS) 32G X 4 MM MISC Use as directed with insulin pen 100 each 0   metoprolol succinate (TOPROL-XL) 25 MG 24 hr tablet Take 1 tablet (25 mg total) by mouth daily. Take with or immediately following a meal. 90 tablet 3   Multiple Vitamin (MULTIVITAMIN WITH MINERALS) TABS tablet Take 1 tablet by mouth daily.     sacubitril-valsartan (ENTRESTO) 49-51 MG Take 1 tablet by mouth 2 (two) times daily. 180 tablet 3   spironolactone (ALDACTONE) 25 MG tablet Take 1 tablet (25 mg total) by mouth daily. 30 tablet 3   vitamin B-12 (CYANOCOBALAMIN) 1000 MCG tablet Take 3,000 mcg by mouth daily.     No current facility-administered medications for this encounter.   Allergies  Allergen Reactions   Aspirin     Intolerance (aspirin sensitive stomach)   Darvocet [Propoxyphene N-Acetaminophen]    Erythromycin    Gabapentin Other (See Comments)    Suicidal ideations    Ibuprofen    Metformin And Related Nausea Only   Social History   Socioeconomic History   Marital status: Married    Spouse name: Not on file   Number of children: Not on file   Years of education: Not on file   Highest education level: Not on file  Occupational History   Not on file  Tobacco Use   Smoking status: Never   Smokeless tobacco: Never  Vaping Use   Vaping Use: Never used  Substance and Sexual Activity   Alcohol use: No   Drug use: No   Sexual activity: Not Currently  Other Topics Concern   Not on file  Social History Narrative   Not on file   Social Determinants of Health   Financial Resource Strain: Medium Risk  (10/31/2021)   Overall Financial Resource Strain (CARDIA)    Difficulty of Paying Living Expenses: Somewhat hard  Food Insecurity: No Food Insecurity (10/31/2021)   Hunger Vital Sign    Worried About Running Out of Food in the Last Year: Never true    Ran Out of Food in the Last Year: Never true  Transportation Needs: No Transportation Needs (10/31/2021)   PRAPARE - Hydrologist (Medical): No    Lack of Transportation (Non-Medical): No  Physical Activity: Not on file  Stress: Not on file  Social Connections: Not on file  Intimate Partner Violence: Not on file   Family History  Problem Relation Age of Onset   Diabetes Mother    Hypertension Mother    Arthritis Mother        RA  Pancreatic cancer Father    Stomach cancer Neg Hx    Colon cancer Neg Hx    Esophageal cancer Neg Hx    Rectal cancer Neg Hx    BP 124/70   Pulse 83   Wt 44.5 kg (98 lb 3.2 oz)   LMP 11/16/2014 (Approximate)   SpO2 100%   BMI 16.60 kg/m   Wt Readings from Last 3 Encounters:  02/20/22 44.5 kg (98 lb 3.2 oz)  01/19/22 45.5 kg (100 lb 3.2 oz)  11/27/21 45.5 kg (100 lb 3.2 oz)   PHYSICAL EXAM: General:  NAD. No resp difficulty, cachectic HEENT: Normal, temporal wasting. Neck: Supple. No JVD. Carotids 2+ bilat; no bruits. No lymphadenopathy or thryomegaly appreciated. Cor: PMI nondisplaced. Regular rate & rhythm. No rubs, gallops or murmurs. Lungs: Clear Abdomen: Soft, nontender, nondistended. No hepatosplenomegaly. No bruits or masses. Good bowel sounds. Extremities: No cyanosis, clubbing, rash, edema Neuro: Alert & oriented x 3, cranial nerves grossly intact. Moves all 4 extremities w/o difficulty. Affect pleasant.  ASSESSMENT & PLAN: 1. Chronic Biventricular Heart Failure. - New/iCM  - Echo (3/23): EF 20-25%, RV moderately reduced. - LHC (3/23): w/ severe diffuse MVCAD. - RHC (3/23): on milrinone 0.25 w/ low filling pressures and low CO/CI by thermo, CO 3L/min, CI  2L/m. PAPI 1.8. Milrinone increased to 0.5.  - cMRI (3/23): EF 24%, RVEF 48%, coronary disease type LGE present, LGE < 50% wall thickness in all areas except apex.  - NYHA II. Euvolemic on exam. She does not need daily loops. - Continue Entresto 49-51 mg bid.  - Continue spironolactone 25 mg daily.  - Continue Farxiga 10 mg daily.   - Continue digoxin 0.0625 mg daily.  - Continue Toprol XL 25 mg daily  - Repeat echo in 2-3 months. - Labs today; dig trough in a couple weeks as she took her digoxin this morning.  2. CAD - diffuse MV CAD - Prox RCA lesion is 100% stenosed. (RPAV filled by collaterals from Dist Cx) - Ost LM lesion is 60% stenosed. - Mid LAD lesion is 80% stenosed. - 2nd Diag lesion is 85% stenosed. - 1st Diag lesion is 99% stenosed. - Prox LAD lesion is 80% stenosed. - Mid Cx lesion is 70% stenosed. - Not CABG candidate with poor targets, malnutrition, marked LV systolic dysfunction.  - Reviewed with IC team. Not candidate for PCI at this point - cMRI (3/23): LVEF 24% with < 50% wall thickness LGE in all areas except apex. Coronary anatomy not favorable for PCI - No chest pain. - Continue Plavix + ASA - Continue atorva 80 mg daily. - Continue low dose Toprol XL. - Would benefit from CR, but no insurance.   3. Type 2 Diabetes  - Uncontrolled. - Hgb A1c 11.5 - On SGLT2i. No GU symptoms. - Has established w/ new PCP    4. HLD, LDL goal < 70  - LDL 102 - Continue atorva 80 - Check lipids and LFTs today.  5. Protein calorie malnutrition - Body mass index is 16.6 kg/m. - Says her appetite is OK, struggles with diarrhea vs constipation some. - Need to watch. Consider referral to dietician.   Follow up in 2 months with Dr. Haroldine Laws + echo.  [place], FNP-BC 02/20/22

## 2022-02-20 NOTE — Patient Instructions (Addendum)
Thank you for coming in today  Labs were done today, if any labs are abnormal the clinic will call you No news is good news   Your physician recommends that you schedule a follow-up appointment in:  Keep follow up with Dr. Haroldine Laws  Your physician recommends that you return for lab work in: 2 weeks    Do the following things EVERYDAY: Weigh yourself in the morning before breakfast. Write it down and keep it in a log. Take your medicines as prescribed Eat low salt foods--Limit salt (sodium) to 2000 mg per day.  Stay as active as you can everyday Limit all fluids for the day to less than 2 liters  At the [facility], you and your health needs are our priority. As part of our continuing mission to provide you with exceptional heart care, we have created designated Provider Care Teams. These Care Teams include your primary Cardiologist (physician) and Advanced Practice Providers (APPs- Physician Assistants and Nurse Practitioners) who all work together to provide you with the care you need, when you need it.   You may see any of the following providers on your designated Care Team at your next follow up: Dr Glori Bickers Dr Haynes Kerns, NP Lyda Jester, [place] [facility] [place], [place] Audry Riles, PharmD   Please be sure to bring in all your medications bottles to every appointment.   If you have any questions or concerns before your next appointment please send [place] a message through [place] or call our office at [phone].    TO LEAVE A MESSAGE FOR THE NURSE SELECT OPTION 2, PLEASE LEAVE A MESSAGE INCLUDING: YOUR NAME DATE OF BIRTH CALL BACK NUMBER REASON FOR CALL**this is important as we prioritize the call backs  YOU WILL RECEIVE A CALL BACK THE SAME DAY AS LONG AS YOU CALL BEFORE 4:00 PM

## 2022-03-05 ENCOUNTER — Ambulatory Visit (HOSPITAL_COMMUNITY)
Admission: RE | Admit: 2022-03-05 | Discharge: 2022-03-05 | Disposition: A | Discharge status: Home / Self Care | Payer: Medicaid Other | Source: Ambulatory Visit | Attending: Cardiology | Admitting: Cardiology

## 2022-03-05 DIAGNOSIS — I5022 Chronic systolic (congestive) heart failure: Secondary | ICD-10-CM | POA: Insufficient documentation

## 2022-03-05 LAB — DIGOXIN LEVEL: Digoxin Level: 0.3 ng/mL — ABNORMAL LOW (ref 0.8–2.0)

## 2022-03-10 ENCOUNTER — Other Ambulatory Visit: Payer: Self-pay

## 2022-03-10 ENCOUNTER — Ambulatory Visit: Payer: Medicaid Other | Attending: Internal Medicine | Admitting: Pharmacist

## 2022-03-10 ENCOUNTER — Encounter: Payer: Self-pay | Admitting: Pharmacist

## 2022-03-10 DIAGNOSIS — E1165 Type 2 diabetes mellitus with hyperglycemia: Secondary | ICD-10-CM | POA: Diagnosis not present

## 2022-03-10 DIAGNOSIS — I5022 Chronic systolic (congestive) heart failure: Secondary | ICD-10-CM

## 2022-03-10 MED ORDER — METOPROLOL SUCCINATE ER 25 MG PO TB24
25.0000 mg | ORAL_TABLET | Freq: Every day | ORAL | 3 refills | Status: DC
  Filled 2022-03-10 – 2022-05-07 (×2): qty 90, 90d supply, fill #0
  Filled 2022-08-05: qty 90, 90d supply, fill #1
  Filled 2022-11-12: qty 90, 90d supply, fill #2
  Filled 2023-03-01: qty 90, 90d supply, fill #3

## 2022-03-10 MED ORDER — SPIRONOLACTONE 25 MG PO TABS
25.0000 mg | ORAL_TABLET | Freq: Every day | ORAL | 3 refills | Status: DC
  Filled 2022-03-10: qty 30, 30d supply, fill #0
  Filled 2022-04-15: qty 30, 30d supply, fill #1
  Filled 2022-05-13: qty 30, 30d supply, fill #2
  Filled 2022-06-28: qty 30, 30d supply, fill #3

## 2022-03-10 MED ORDER — ATORVASTATIN CALCIUM 80 MG PO TABS
80.0000 mg | ORAL_TABLET | Freq: Every day | ORAL | 3 refills | Status: DC
  Filled 2022-03-10: qty 30, 30d supply, fill #0
  Filled 2022-04-15: qty 30, 30d supply, fill #1
  Filled 2022-05-13: qty 30, 30d supply, fill #2
  Filled 2022-06-16: qty 30, 30d supply, fill #3

## 2022-03-10 MED ORDER — CLOPIDOGREL BISULFATE 75 MG PO TABS
75.0000 mg | ORAL_TABLET | Freq: Every day | ORAL | 3 refills | Status: DC
  Filled 2022-03-10 – 2022-03-25 (×2): qty 30, 30d supply, fill #0
  Filled 2022-04-30: qty 30, 30d supply, fill #1
  Filled 2022-05-29: qty 30, 30d supply, fill #2
  Filled 2022-07-13: qty 30, 30d supply, fill #3

## 2022-03-10 MED ORDER — SACUBITRIL-VALSARTAN 49-51 MG PO TABS
1.0000 | ORAL_TABLET | Freq: Two times a day (BID) | ORAL | 3 refills | Status: DC
  Filled 2022-03-10: qty 180, 90d supply, fill #0
  Filled 2022-06-16: qty 180, 90d supply, fill #1
  Filled 2022-09-27: qty 180, 90d supply, fill #2
  Filled 2023-01-15: qty 180, 90d supply, fill #3

## 2022-03-10 MED ORDER — INSULIN GLARGINE SOLOSTAR 100 UNIT/ML ~~LOC~~ SOPN
10.0000 [IU] | PEN_INJECTOR | Freq: Every day | SUBCUTANEOUS | 2 refills | Status: DC
  Filled 2022-03-10: qty 3, 30d supply, fill #0

## 2022-03-10 MED ORDER — DIGOXIN 125 MCG PO TABS
0.0625 mg | ORAL_TABLET | Freq: Every day | ORAL | 3 refills | Status: DC
  Filled 2022-03-10 – 2022-03-25 (×2): qty 45, 90d supply, fill #0

## 2022-03-10 MED ORDER — DAPAGLIFLOZIN PROPANEDIOL 10 MG PO TABS
10.0000 mg | ORAL_TABLET | Freq: Every day | ORAL | 3 refills | Status: DC
  Filled 2022-03-10 – 2022-03-25 (×2): qty 30, 30d supply, fill #0
  Filled 2022-04-23: qty 30, 30d supply, fill #1
  Filled 2022-05-21: qty 30, 30d supply, fill #2
  Filled 2022-06-16: qty 30, 30d supply, fill #3

## 2022-03-10 MED ORDER — INSULIN GLARGINE SOLOSTAR 100 UNIT/ML ~~LOC~~ SOPN
15.0000 [IU] | PEN_INJECTOR | Freq: Every day | SUBCUTANEOUS | 2 refills | Status: DC
  Filled 2022-03-10 – 2022-03-13 (×3): qty 6, 40d supply, fill #0

## 2022-03-10 NOTE — Progress Notes (Signed)
S:     No chief complaint on file.  Brooke [name] is a 61 y.o. female who presents for diabetes evaluation, education, and management.  PMH is significant for  DM2, chronic pelvic floor dysfunction, protein calorie malnutrition, and new diagnosis of CAD and systolic heart failure/iCM.  Patient was referred and last seen by Freeman Caldron, on 11/27/2021.   Briefly, she was admitted with acute CHF and cardiogenic shock in March of this year. CT significant for multivessel coronary artery calcifications. Echo showed severe biventricular dysfunction. Not CABG candidate d/t poor targets. GDMT titrated. She has been followed by CHF clinic in the meantime with last visit 02/20/2022. No changes were made to her medications at that visit.   Today, patient arrives in good spirits and presents without any assistance.  Patient reports Diabetes was diagnosed "years ago". While in the hospital in March of this year, A1c was found to be 11.5%. She was placed on insulin and remains compliant with this and her [place]. She denies any hx of pancreatitis. She has no dx of CKD, stroke, or ACS. CHF hx summarized above and she is being followed by CHF team.    Family/Social History:  Fhx: DM, HTN, pancreatic cancer,  Tobacco: never smoker Alcohol: none reported  Current diabetes medications include: Farxiga 10 mg daily, Lantus 10u daily.   Patient reports adherence to taking all medications as prescribed.   Insurance coverage: [place] Medicaid  Patient denies hypoglycemic events.  Reported home fasting blood sugars: 170s - 240s  Reported 2 hour post-meal/random blood sugars: not checking.  Patient denies nocturia (nighttime urination).  Patient denies neuropathy (nerve pain). Patient denies visual changes. Patient reports self foot exams.   Patient reported dietary habits: Eats 3 meals/day Breakfast: oatmeal  Lunch: sandwich with white bread Dinner: beans, chicken or fish, potatoes, green vegetables   Snacks: none Drinks: mostly water and hot tea (unsweetened)  Patient-reported exercise habits: walks about 15 minutes daily    O:   ROS  Physical Exam  7 day average blood glucose: no meter with her today   Lab Results  Component Value Date   HGBA1C 11.5 (H) 10/09/2021   There were no vitals filed for this visit.  Lipid Panel     Component Value Date/Time   CHOL 147 02/20/2022 1548   CHOL 192 12/13/2015 0856   TRIG 134 02/20/2022 1548   HDL 42 02/20/2022 1548   HDL 41 12/13/2015 0856   CHOLHDL 3.5 02/20/2022 1548   VLDL 27 02/20/2022 1548   LDLCALC 78 02/20/2022 1548   LDLCALC 118 (H) 12/13/2015 0856    Patient is participating in a Managed Medicaid Plan:  Yes   A/P: Diabetes longstanding currently uncontrolled. Patient is able to verbalize appropriate hypoglycemia management plan. Medication adherence appears optimal. Given her small body habitus, she is not a candidate for a GLP-1 RA at this time. Will titrate insulin. She is already on an SGLT-2i. -Increased dose of Lantus to 15 units daily. Instructed to increase to 20 units after 1 week if home CBGs are still above goal. -Continue Farxiga 10 mg daily.  -Extensively discussed pathophysiology of diabetes, recommended lifestyle interventions, dietary effects on blood sugar control.  -Counseled on s/sx of and management of hypoglycemia.  -A1c today.  -Refills for medication sent to our pharmacy for convenience.   Written patient instructions provided. Patient verbalized understanding of treatment plan.  Total time in face to face counseling 30 minutes.    Follow-up:  Pharmacist 1  month.   Benard Halsted, PharmD, [facility] [place], [place] [phone]

## 2022-03-11 ENCOUNTER — Other Ambulatory Visit: Payer: Self-pay

## 2022-03-11 ENCOUNTER — Other Ambulatory Visit (HOSPITAL_COMMUNITY): Payer: Self-pay

## 2022-03-13 ENCOUNTER — Other Ambulatory Visit: Payer: Self-pay

## 2022-03-24 ENCOUNTER — Other Ambulatory Visit (HOSPITAL_COMMUNITY): Payer: Self-pay

## 2022-03-24 DIAGNOSIS — I5022 Chronic systolic (congestive) heart failure: Secondary | ICD-10-CM

## 2022-03-24 NOTE — Progress Notes (Signed)
Orders Placed This Encounter  Procedures   ECHOCARDIOGRAM COMPLETE    Standing Status:   Future    Standing Expiration Date:   03/25/2023    Order Specific Question:   Where should this test be performed    Answer:   [place]    Order Specific Question:   Perflutren DEFINITY (image enhancing agent) should be administered unless hypersensitivity or allergy exist    Answer:   Administer Perflutren    Order Specific Question:   Reason for exam-Echo    Answer:   Congestive Heart Failure  I50.9    Order Specific Question:   Release to patient    Answer:   Immediate

## 2022-03-25 ENCOUNTER — Other Ambulatory Visit: Payer: Self-pay

## 2022-03-25 ENCOUNTER — Other Ambulatory Visit (HOSPITAL_COMMUNITY): Payer: Self-pay

## 2022-04-02 DIAGNOSIS — M5416 Radiculopathy, lumbar region: Secondary | ICD-10-CM | POA: Diagnosis not present

## 2022-04-07 ENCOUNTER — Other Ambulatory Visit: Payer: Self-pay

## 2022-04-07 ENCOUNTER — Ambulatory Visit: Payer: Medicaid Other | Attending: Family Medicine | Admitting: Family Medicine

## 2022-04-07 ENCOUNTER — Encounter: Payer: Self-pay | Admitting: Family Medicine

## 2022-04-07 VITALS — BP 149/79 | HR 71 | Temp 97.8°F | Ht 64.0 in | Wt 87.0 lb

## 2022-04-07 DIAGNOSIS — E43 Unspecified severe protein-calorie malnutrition: Secondary | ICD-10-CM | POA: Diagnosis not present

## 2022-04-07 DIAGNOSIS — R197 Diarrhea, unspecified: Secondary | ICD-10-CM

## 2022-04-07 DIAGNOSIS — I5042 Chronic combined systolic (congestive) and diastolic (congestive) heart failure: Secondary | ICD-10-CM | POA: Diagnosis not present

## 2022-04-07 DIAGNOSIS — Z23 Encounter for immunization: Secondary | ICD-10-CM | POA: Diagnosis not present

## 2022-04-07 DIAGNOSIS — I110 Hypertensive heart disease with heart failure: Secondary | ICD-10-CM

## 2022-04-07 DIAGNOSIS — R1013 Epigastric pain: Secondary | ICD-10-CM | POA: Diagnosis not present

## 2022-04-07 DIAGNOSIS — I152 Hypertension secondary to endocrine disorders: Secondary | ICD-10-CM

## 2022-04-07 DIAGNOSIS — Z1159 Encounter for screening for other viral diseases: Secondary | ICD-10-CM

## 2022-04-07 DIAGNOSIS — E46 Unspecified protein-calorie malnutrition: Secondary | ICD-10-CM | POA: Insufficient documentation

## 2022-04-07 DIAGNOSIS — E119 Type 2 diabetes mellitus without complications: Secondary | ICD-10-CM

## 2022-04-07 DIAGNOSIS — E1159 Type 2 diabetes mellitus with other circulatory complications: Secondary | ICD-10-CM

## 2022-04-07 LAB — POCT GLYCOSYLATED HEMOGLOBIN (HGB A1C): Hemoglobin A1C: 11.9 % — AB (ref 4.0–5.6)

## 2022-04-07 LAB — GLUCOSE, POCT (MANUAL RESULT ENTRY): POC Glucose: 220 mg/dl — AB (ref 70–99)

## 2022-04-07 MED ORDER — OMEPRAZOLE 40 MG PO CPDR
40.0000 mg | DELAYED_RELEASE_CAPSULE | Freq: Every day | ORAL | 1 refills | Status: DC
  Filled 2022-04-07: qty 30, 30d supply, fill #0

## 2022-04-07 MED ORDER — NOVOLOG FLEXPEN 100 UNIT/ML ~~LOC~~ SOPN
0.0000 [IU] | PEN_INJECTOR | Freq: Three times a day (TID) | SUBCUTANEOUS | 11 refills | Status: DC
  Filled 2022-04-07 (×2): qty 30, 83d supply, fill #0

## 2022-04-07 MED ORDER — INSULIN GLARGINE SOLOSTAR 100 UNIT/ML ~~LOC~~ SOPN
20.0000 [IU] | PEN_INJECTOR | Freq: Every day | SUBCUTANEOUS | 2 refills | Status: DC
  Filled 2022-04-07: qty 6, 30d supply, fill #0

## 2022-04-07 NOTE — Progress Notes (Signed)
Pt has been experiencing constipation and diarrhea on and off since March.   Referral to Gynecology.

## 2022-04-07 NOTE — Progress Notes (Signed)
Subjective:  Patient ID: Brooke [name], female    DOB: March 22, 1961  Age: 61 y.o. MRN: [id number]  CC: New Patient (Initial Visit)   HPI Arnetta Odeh is a 61 y.o. year old female with a history of type 2 diabetes mellitus, HFrEF (EF 20 to 25%), CAD, hypertension, anxiety and depression, protein calorie malnutrition who presents to establish care. In 10/2021 she had a hospitalization for cardiogenic shock, multivessel CAD treated with milrinone and diuresis.  Per notes not a CABG candidate with poor targets, coronary anatomy not favorable for PCI and she was weaned off milrinone.  Interval History: Last visit with heart failure clinic was in 02/2022  She is concerned she weighs 87 lbs and has not been this small since she was in 7th grade. States she has a good appetite but is still loosing weight. States she has always been small but noticed loosing weight when she was hospitalized in 10/2021. She had a normal TSH in 12/2021.  She has alternating diarrhea, normal stool, constipation on different days of the week Also Complains of epigastric discomfort which she has had for a long time and states somedays she is bloated and having excessive flatulence and sometimes on passing gas she accidentally moves her bowel  CT Abdomen from 10/2020: IMPRESSION: 1. Minimal distal esophageal wall thickening, can be seen with reflux or esophagitis. 2. Mild descending colonic diverticulosis without diverticulitis. 3. Bulbous uterus with fibroids, less well-defined on the current exam than previously.   Aortic Atherosclerosis (ICD10-I70.0).   Blood sugars fasting are usually 180 and random sugars are 175-240 but endorses adherence with Lantus which she was supposed to administer 15 units nightly and increase to 20 units if blood sugars are above 1 goal after 1 week but she never increased to 20 units and has been taking 15 units.  She is also on [place]. She had an ESI last thursday Past Medical History:   Diagnosis Date   Abdominal pain, left lower quadrant 12/17/2015   Chest pain, unspecified 10/05/2003   Depressive disorder, not elsewhere classified 07/31/2005   Diabetes mellitus without complication ([place]) 93/81/0175   Endometriosis 09/14/2012   Hypertension 11/23/2003   Unspecified constipation 06/24/2004    Past Surgical History:  Procedure Laterality Date   APPENDECTOMY  1996   Dr. Lindon Romp   BREAST SURGERY Bilateral 1996   Dr. Haskell Riling HERNIA REPAIR     OVARIAN CYST REMOVAL     RIGHT/LEFT HEART CATH AND CORONARY ANGIOGRAPHY N/A 10/09/2021   Procedure: RIGHT/LEFT HEART CATH AND CORONARY ANGIOGRAPHY;  Surgeon: Early Osmond, MD;  Location: [facility];  Service: Cardiovascular;  Laterality: N/A;    Family History  Problem Relation Age of Onset   Diabetes Mother    Hypertension Mother    Arthritis Mother        RA   Pancreatic cancer Father    Stomach cancer Neg Hx    Colon cancer Neg Hx    Esophageal cancer Neg Hx    Rectal cancer Neg Hx     Social History   Socioeconomic History   Marital status: Married    Spouse name: Not on file   Number of children: Not on file   Years of education: Not on file   Highest education level: Not on file  Occupational History   Not on file  Tobacco Use   Smoking status: Never   Smokeless tobacco: Never  Vaping Use   Vaping Use: Never used  Substance  and Sexual Activity   Alcohol use: No   Drug use: No   Sexual activity: Not Currently  Other Topics Concern   Not on file  Social History Narrative   Not on file   Social Determinants of Health   Financial Resource Strain: Medium Risk (10/31/2021)   Overall Financial Resource Strain (CARDIA)    Difficulty of Paying Living Expenses: Somewhat hard  Food Insecurity: No Food Insecurity (10/31/2021)   Hunger Vital Sign    Worried About Running Out of Food in the Last Year: Never true    Ran Out of Food in the Last Year: Never true  Transportation Needs: No  Transportation Needs (10/31/2021)   PRAPARE - Hydrologist (Medical): No    Lack of Transportation (Non-Medical): No  Physical Activity: Not on file  Stress: Not on file  Social Connections: Not on file    Allergies  Allergen Reactions   Aspirin     Intolerance (aspirin sensitive stomach)   Darvocet [Propoxyphene N-Acetaminophen]    Erythromycin    Gabapentin Other (See Comments)    Suicidal ideations    Ibuprofen    Metformin And Related Nausea Only    Outpatient Medications Prior to Visit  Medication Sig Dispense Refill   acetaminophen (TYLENOL) 500 MG tablet Take 1,000 mg by mouth every 6 (six) hours as needed for mild pain, fever or headache.     aspirin 81 MG chewable tablet Chew 1 tablet (81 mg total) by mouth daily. 30 tablet 1   atorvastatin (LIPITOR) 80 MG tablet Take 1 tablet (80 mg total) by mouth daily. 30 tablet 3   clopidogrel (PLAVIX) 75 MG tablet Take 1 tablet (75 mg total) by mouth daily. 30 tablet 3   dapagliflozin propanediol (FARXIGA) 10 MG TABS tablet Take 1 tablet (10 mg total) by mouth daily. 30 tablet 3   digoxin (LANOXIN) 0.125 MG tablet Take 0.5 tablets (0.0625 mg total) by mouth daily. 45 tablet 3   Insulin Pen Needle (PENTIPS) 32G X 4 MM MISC Use as directed with insulin pen 100 each 0   metoprolol succinate (TOPROL-XL) 25 MG 24 hr tablet Take 1 tablet (25 mg total) by mouth daily. Take with or immediately following a meal. 90 tablet 3   Multiple Vitamin (MULTIVITAMIN WITH MINERALS) TABS tablet Take 1 tablet by mouth daily.     sacubitril-valsartan (ENTRESTO) 49-51 MG Take 1 tablet by mouth 2 (two) times daily. 180 tablet 3   spironolactone (ALDACTONE) 25 MG tablet Take 1 tablet (25 mg total) by mouth daily. 30 tablet 3   vitamin B-12 (CYANOCOBALAMIN) 1000 MCG tablet Take 3,000 mcg by mouth daily.     Insulin Glargine Solostar (LANTUS) 100 UNIT/ML Solostar Pen Inject 15 Units into the skin daily. May increase to 20 units daily  after 1 week if home CBGs are still above goal. 6 mL 2   No facility-administered medications prior to visit.     ROS Review of Systems  Constitutional:  Negative for activity change and appetite change.  HENT:  Negative for sinus pressure and sore throat.   Respiratory:  Negative for chest tightness, shortness of breath and wheezing.   Cardiovascular:  Negative for chest pain and palpitations.  Gastrointestinal:        See HPI  Genitourinary: Negative.   Musculoskeletal: Negative.   Psychiatric/Behavioral:  Negative for behavioral problems and dysphoric mood.     Objective:  BP (!) 149/79   Pulse 71  Temp 97.8 F (36.6 C) (Oral)   Ht '5\' 4"'$  (1.626 m)   Wt 87 lb (39.5 kg)   LMP 11/16/2014 (Approximate)   SpO2 100%   BMI 14.93 kg/m      04/07/2022    8:52 AM 02/20/2022    3:20 PM 01/19/2022    2:03 PM  BP/Weight  Systolic BP 412 878 676  Diastolic BP 79 70 64  Wt. (Lbs) 87 98.2 100.2  BMI 14.93 kg/m2 16.6 kg/m2 16.93 kg/m2      Physical Exam Constitutional:      Appearance: She is well-developed.     Comments: Underweight  Cardiovascular:     Rate and Rhythm: Normal rate.     Heart sounds: Normal heart sounds. No murmur heard. Pulmonary:     Effort: Pulmonary effort is normal.     Breath sounds: Normal breath sounds. No wheezing or rales.  Chest:     Chest wall: No tenderness.  Abdominal:     General: Bowel sounds are normal. There is no distension.     Palpations: Abdomen is soft. There is no mass.     Tenderness: There is no abdominal tenderness.  Musculoskeletal:        General: Normal range of motion.     Right lower leg: No edema.     Left lower leg: No edema.  Neurological:     Mental Status: She is alert and oriented to person, place, and time.  Psychiatric:        Mood and Affect: Mood normal.    Diabetic Foot Exam - Simple   Simple Foot Form Diabetic Foot exam was performed with the following findings: Yes 04/07/2022 10:30 AM  Visual  Inspection No deformities, no ulcerations, no other skin breakdown bilaterally: Yes Sensation Testing Intact to touch and monofilament testing bilaterally: Yes Pulse Check Posterior Tibialis and Dorsalis pulse intact bilaterally: Yes Comments         Latest Ref Rng & Units 02/20/2022    3:48 PM 12/02/2021    2:33 PM 11/25/2021    3:49 PM  CMP  Glucose 70 - 99 mg/dL 467  257  255   BUN 6 - 20 mg/dL '10  9  8   '$ Creatinine 0.44 - 1.00 mg/dL 0.79  0.73  0.65   Sodium 135 - 145 mmol/L 135  137  138   Potassium 3.5 - 5.1 mmol/L 4.8  4.5  4.3   Chloride 98 - 111 mmol/L 97  103  103   CO2 22 - 32 mmol/L '25  28  28   '$ Calcium 8.9 - 10.3 mg/dL 9.4  9.4  9.5   Total Protein 6.5 - 8.1 g/dL 7.4     Total Bilirubin 0.3 - 1.2 mg/dL 0.3     Alkaline Phos 38 - 126 U/L 70     AST 15 - 41 U/L 27     ALT 0 - 44 U/L 32       Lipid Panel     Component Value Date/Time   CHOL 147 02/20/2022 1548   CHOL 192 12/13/2015 0856   TRIG 134 02/20/2022 1548   HDL 42 02/20/2022 1548   HDL 41 12/13/2015 0856   CHOLHDL 3.5 02/20/2022 1548   VLDL 27 02/20/2022 1548   LDLCALC 78 02/20/2022 1548   LDLCALC 118 (H) 12/13/2015 0856    CBC    Component Value Date/Time   WBC 10.6 (H) 10/17/2021 0459   RBC 5.38 (H) 10/17/2021 0459  HGB 13.3 10/17/2021 0459   HCT 40.7 10/17/2021 0459   PLT 376 10/17/2021 0459   MCV 75.7 (L) 10/17/2021 0459   MCH 24.7 (L) 10/17/2021 0459   MCHC 32.7 10/17/2021 0459   RDW 13.9 10/17/2021 0459   LYMPHSABS 1.1 10/08/2021 1947   MONOABS 0.7 10/08/2021 1947   EOSABS 0.0 10/08/2021 1947   BASOSABS 0.0 10/08/2021 1947    Lab Results  Component Value Date   HGBA1C 11.9 (A) 04/07/2022    Lab Results  Component Value Date   TSH 2.060 10/09/2021    Assessment & Plan:  1. Diabetes mellitus without complication ([place]) Uncontrolled with A1c of 11.9; goal is less than 7.0 Increase Lantus to 20 units nightly and commence NovoLog sliding scale She has a follow-up  appointment with the clinical pharmacist to review her blood sugar log Counseled on Diabetic diet, my plate method, 409 minutes of moderate intensity exercise/week Blood sugar logs with fasting goals of 80-120 mg/dl, random of less than 180 and in the event of sugars less than 60 mg/dl or greater than 400 mg/dl encouraged to notify the clinic. Advised on the need for annual eye exams, annual foot exams, Pneumonia vaccine. - Ambulatory referral to Ophthalmology - POCT glycosylated hemoglobin (Hb A1C) - POCT glucose (manual entry) - Insulin Glargine Solostar (LANTUS) 100 UNIT/ML Solostar Pen; Inject 20 Units into the skin daily.  Dispense: 6 mL; Refill: 2 - insulin aspart (NOVOLOG FLEXPEN) 100 UNIT/ML FlexPen; Inject 0-12 Units into the skin 3 (three) times daily with meals. As per sliding scale  Dispense: 30 mL; Refill: 11  2. Need for Tdap vaccination - Tdap vaccine greater than or equal to 7yo IM  3. Epigastric pain Suspicious for gastritis CT abdomen from last year revealed presence of findings suspicious for reflux or esophagitis We will evaluate for presence of H. pylori and also initiate PPI Advised to avoid recumbency up to 2 hours postmeal, avoid late meals, avoid foods that trigger symptoms. CT findings also revealed presence of diverticulitis, uterine fibroids which could also cause pain - H. pylori breath test - omeprazole (PRILOSEC) 40 MG capsule; Take 1 capsule (40 mg total) by mouth daily.  Dispense: 30 capsule; Refill: 1  4. Diarrhea, unspecified type We will need to exclude pancreatic insufficiency - Pancreatic Elastase, Fecal  5. Need for hepatitis C screening test - HCV Ab w Reflex to Quant PCR  6. Hypertensive heart disease with chronic combined systolic and diastolic congestive heart failure (HCC) EF 20 to 25% Euvolemic We will need ischemic work-up by means of cardiac cath Continue guideline directed medical therapy-beta-blocker, SGLT2i, Entresto Continue with  CHF clinic follow-up  7. Hypertension associated with diabetes ([place]) Slightly above goal BP was normal at her last visit hence I will make no regimen change Counseled on blood pressure goal of less than 130/80, low-sodium, DASH diet, medication compliance, 150 minutes of moderate intensity exercise per week. Discussed medication compliance, adverse effects.  8. Severe protein-calorie malnutrition (HCC) TSH was normal Likely combination of weight loss secondary to uncontrolled diabetes test and cardiac ataxia We will also pursue malignancy work-up We will have her return in 1 month for appropriate healthcare maintenance and cancer screenings    Health Care Maintenance: We will address at next visit Meds ordered this encounter  Medications   omeprazole (PRILOSEC) 40 MG capsule    Sig: Take 1 capsule (40 mg total) by mouth daily.    Dispense:  30 capsule    Refill:  1  Insulin Glargine Solostar (LANTUS) 100 UNIT/ML Solostar Pen    Sig: Inject 20 Units into the skin daily.    Dispense:  6 mL    Refill:  2    Dose increase   insulin aspart (NOVOLOG FLEXPEN) 100 UNIT/ML FlexPen    Sig: Inject 0-12 Units into the skin 3 (three) times daily with meals. As per sliding scale    Dispense:  30 mL    Refill:  11    For blood sugars 0-150 give 0 units of insulin, 151-200 give 2 units of insulin, 201-250 give 4 units, 251-300 give 6 units, 301-350 give 8 units, 351-400 give 10 units,> 400 give 12 units    Visit required 46 minutes of patient care including median intraservice time, reviewing previous notes and test results, coordination of care, counseling the patient in addition to management of chronic medical conditions.Time also spent ordering medications, investigations and documenting in the chart.  All questions were answered to the patient's satisfaction    Follow-up: Return in about 1 month (around 05/08/2022) for Haynes, MD,  FAAFP. [facility] [place], [place]   04/07/2022, 1:48 PM

## 2022-04-07 NOTE — Patient Instructions (Signed)
Malnutrition, Adult Malnutrition, specifically undernutrition, is a condition that can occur gradually or quickly, it usually depends on the cause. Malnutrition is a group of symptoms that affects adults such as: loss of appetite, weakness, fatigue, and weight loss. What are the causes? This condition may be caused by: A chronic disease, such as dementia, diabetes, cancer, or chronic obstructive pulmonary disease. A sudden illness like sepsis, a major surgery, or a traumatic event that puts you in a hospital in critical care. A mental health disorder, such as depression. A disability that limits you from getting or making food. Medicines. Having tooth or mouth problems. Mistreatment or neglect. In some cases, the cause may not be known. What are the signs or symptoms? Symptoms of this condition include: Loss of more than 5% of your body weight. Being more tired than normal after an activity. Loss of appetite. Not getting out of bed. Not wanting to do usual activities. Frequent infections. Fragile skin that results in skin breakdown or bedsores. How is this diagnosed? This condition may be diagnosed with a diet history, physical exam, and tests. Your health care provider will ask questions about your diet, level of physical activity, access to food, health conditions, and typical dietary patterns, such as: Has your diet changed? Do you have any physical limitations or barriers? Are you able to eat, chew, and safely swallow? Tests may also be done. They may include: Blood tests. Urine tests. Imaging tests, such as: X-rays. CT scan. MRI. Tests to check thinking ability (cognitive tests). Activity tests. Your health care provider may want to see if you can do tasks such as bathing and dressing and if you can move around safely. You may be referred to a specialist. How is this treated? Treatment for this condition depends on the cause. It may be treated by: Treating a disease or  disorder that is causing symptoms. Having talk therapy or taking medicine to treat depression. Improving diet, such as by eating more often or taking nutritional supplements. Changing or stopping a medicine. Having physical or occupational therapy. It often takes a team of health care providers to find the right treatment. Follow these instructions at home:  Take over-the-counter and prescription medicines only as told by your health care provider. Eat a healthy, well-balanced diet. Make sure to get enough calories in each meal. Ask your health care provider how many calories you need. Be physically active. Include strength training as part of your exercise routine. A physical therapist can help to set up an exercise program that is right for you. Make sure you have access to food and are able to prepare meals. Have a plan for what to do if you become unable to make decisions for yourself. Contact a health care provider if you: Are not able to eat well. Are not able to move around. Feel very sad or hopeless. Get help right away if: You have thoughts of ending your life. You cannot eat or drink. You do not get out of bed. Staying at home is no longer safe. You have a fever. Get help right away if you feel like you may hurt yourself or others, or have thoughts about taking your own life. Go to your nearest emergency room or: Call 911. Call the [place] at [phone] or 988. This is open 24 hours a day. Text the Crisis Text Line at 804-037-9328. Summary Symptoms include loss of appetite and weight loss. Take over-the-counter and prescription medicines only as told by your  health care provider. Eat a healthy, well-balanced diet. Make sure to get enough calories in each meal. Ask your health care provider how many calories you need. Be physically active. Include strength training as part of your exercise routine. A physical therapist can help to set up an exercise  program that is right for you. This information is not intended to replace advice given to you by your health care provider. Make sure you discuss any questions you have with your health care provider. Document Revised: 04/11/2021 Document Reviewed: 04/11/2021 Elsevier Patient Education  [place].

## 2022-04-08 LAB — HCV INTERPRETATION

## 2022-04-08 LAB — H. PYLORI BREATH TEST: H pylori Breath Test: NEGATIVE

## 2022-04-08 LAB — HCV AB W REFLEX TO QUANT PCR: HCV Ab: NONREACTIVE

## 2022-04-09 ENCOUNTER — Other Ambulatory Visit: Payer: Self-pay

## 2022-04-15 ENCOUNTER — Other Ambulatory Visit: Payer: Self-pay

## 2022-04-23 ENCOUNTER — Other Ambulatory Visit: Payer: Self-pay

## 2022-04-24 ENCOUNTER — Other Ambulatory Visit: Payer: Self-pay

## 2022-04-28 ENCOUNTER — Other Ambulatory Visit: Payer: Self-pay

## 2022-04-28 ENCOUNTER — Ambulatory Visit: Payer: Medicaid Other | Attending: Family Medicine | Admitting: Pharmacist

## 2022-04-28 DIAGNOSIS — E1165 Type 2 diabetes mellitus with hyperglycemia: Secondary | ICD-10-CM

## 2022-04-28 DIAGNOSIS — Z794 Long term (current) use of insulin: Secondary | ICD-10-CM

## 2022-04-28 MED ORDER — INSULIN LISPRO PROT & LISPRO (75-25 MIX) 100 UNIT/ML KWIKPEN
18.0000 [IU] | PEN_INJECTOR | Freq: Two times a day (BID) | SUBCUTANEOUS | 2 refills | Status: DC
  Filled 2022-04-28: qty 15, 42d supply, fill #0
  Filled 2022-06-02: qty 15, 42d supply, fill #1
  Filled 2022-07-21: qty 15, 42d supply, fill #2

## 2022-04-28 MED ORDER — DEXCOM G7 RECEIVER DEVI
0 refills | Status: DC
  Filled 2022-04-28 – 2022-04-29 (×2): qty 1, 365d supply, fill #0

## 2022-04-28 MED ORDER — DEXCOM G7 SENSOR MISC
2 refills | Status: DC
  Filled 2022-04-28 – 2022-04-29 (×2): qty 3, 30d supply, fill #0
  Filled 2022-06-05: qty 3, 30d supply, fill #1
  Filled 2022-07-13: qty 3, 30d supply, fill #2

## 2022-04-28 NOTE — Progress Notes (Signed)
    S:     No chief complaint on file.  Brooke [name] is a 61 y.o. female who presents for diabetes evaluation, education, and management.  PMH is significant for  DM2, chronic pelvic floor dysfunction, protein calorie malnutrition, and new diagnosis of CAD and systolic heart failure/iCM.  Patient was referred and last seen by Dr. Margarita Rana on 04/07/2022. A1c at that visit was still above goal.  Today, patient arrives in good spirits and presents without any assistance. She requests to stop her basal/bolus regimen today. She tells me she is interested in mixed insulin as her daughter takes this with improved control. She struggles to get 4 injections of insulin consistently every day. In addition, she requests CGM services today.   Family/Social History:  Fhx: DM, HTN, pancreatic cancer,  Tobacco: never smoker Alcohol: none reported  Current diabetes medications include: Farxiga 10 mg daily, Lantus 20u daily, Novolog SSI (typically 5-8u before meals)  Patient reports adherence to taking all medications as prescribed. Does struggle to inject Novolog sliding scale consistently.   Insurance coverage: [place] [employer]  Patient denies hypoglycemic events.  Reported home fasting blood sugars: 170s - 240s  Reported 2 hour post-meal/random blood sugars: 170s - 200s.   Patient denies nocturia (nighttime urination).  Patient denies neuropathy (nerve pain). Patient denies visual changes. Patient reports self foot exams.   Patient reported dietary habits: Eats 1-3 meals/day Breakfast: oatmeal  Lunch: sandwich with white bread Dinner: beans, chicken or fish, potatoes, green vegetables  Snacks: none Drinks: mostly water and hot tea (unsweetened)  Patient-reported exercise habits: walks at least 30 minutes daily    O:   ROS  Physical Exam  7 day average blood glucose: no meter with her today   Lab Results  Component Value Date   HGBA1C 11.9 (A) 04/07/2022   There were no vitals filed  for this visit.  Lipid Panel     Component Value Date/Time   CHOL 147 02/20/2022 1548   CHOL 192 12/13/2015 0856   TRIG 134 02/20/2022 1548   HDL 42 02/20/2022 1548   HDL 41 12/13/2015 0856   CHOLHDL 3.5 02/20/2022 1548   VLDL 27 02/20/2022 1548   LDLCALC 78 02/20/2022 1548   LDLCALC 118 (H) 12/13/2015 0856    Patient is participating in a Managed Medicaid Plan:  Yes   A/P: Diabetes longstanding currently uncontrolled. Patient is able to verbalize appropriate hypoglycemia management plan. Medication adherence appears suboptimal. Will have her stop Lantus/Novolog and start Humalog 75/25. Will also send Dexcom G7 supplies.  -Stop Lantus.  -Stop Novolog.  -Start Humalog 75/25 at 18u BID. Pt may increase by 2u BID every 3 days until her fasting CBGs are under control or she reaches a max dose of 24u BID.  -Dexcom G7 supplies sent.  -Continue Farxiga 10 mg daily.  -Extensively discussed pathophysiology of diabetes, recommended lifestyle interventions, dietary effects on blood sugar control.  -Counseled on s/sx of and management of hypoglycemia.    Written patient instructions provided. Patient verbalized understanding of treatment plan.  Total time in face to face counseling 30 minutes.    Follow-up:  Pharmacist 1.5 months.  Benard Halsted, PharmD, [facility] [place], [place] [phone]

## 2022-04-29 ENCOUNTER — Other Ambulatory Visit: Payer: Self-pay

## 2022-04-30 ENCOUNTER — Other Ambulatory Visit: Payer: Self-pay

## 2022-05-01 ENCOUNTER — Ambulatory Visit (HOSPITAL_COMMUNITY)
Admission: RE | Admit: 2022-05-01 | Discharge: 2022-05-01 | Disposition: A | Discharge status: Home / Self Care | Payer: Medicaid Other | Source: Ambulatory Visit | Attending: Internal Medicine | Admitting: Internal Medicine

## 2022-05-01 ENCOUNTER — Ambulatory Visit (HOSPITAL_BASED_OUTPATIENT_CLINIC_OR_DEPARTMENT_OTHER)
Admission: RE | Admit: 2022-05-01 | Discharge: 2022-05-01 | Disposition: A | Discharge status: Home / Self Care | Payer: Medicaid Other | Source: Ambulatory Visit | Attending: Internal Medicine | Admitting: Internal Medicine

## 2022-05-01 ENCOUNTER — Other Ambulatory Visit: Payer: Self-pay | Admitting: Pharmacist

## 2022-05-01 ENCOUNTER — Encounter (HOSPITAL_COMMUNITY): Payer: Self-pay | Admitting: Internal Medicine

## 2022-05-01 ENCOUNTER — Other Ambulatory Visit: Payer: Self-pay

## 2022-05-01 VITALS — BP 110/70 | HR 86 | Wt 97.2 lb

## 2022-05-01 DIAGNOSIS — Z7982 Long term (current) use of aspirin: Secondary | ICD-10-CM | POA: Diagnosis not present

## 2022-05-01 DIAGNOSIS — E1165 Type 2 diabetes mellitus with hyperglycemia: Secondary | ICD-10-CM | POA: Diagnosis not present

## 2022-05-01 DIAGNOSIS — E46 Unspecified protein-calorie malnutrition: Secondary | ICD-10-CM | POA: Insufficient documentation

## 2022-05-01 DIAGNOSIS — E785 Hyperlipidemia, unspecified: Secondary | ICD-10-CM | POA: Diagnosis not present

## 2022-05-01 DIAGNOSIS — Z7901 Long term (current) use of anticoagulants: Secondary | ICD-10-CM | POA: Insufficient documentation

## 2022-05-01 DIAGNOSIS — I110 Hypertensive heart disease with heart failure: Secondary | ICD-10-CM | POA: Insufficient documentation

## 2022-05-01 DIAGNOSIS — Z794 Long term (current) use of insulin: Secondary | ICD-10-CM

## 2022-05-01 DIAGNOSIS — Z681 Body mass index (BMI) 19 or less, adult: Secondary | ICD-10-CM | POA: Insufficient documentation

## 2022-05-01 DIAGNOSIS — I3481 Nonrheumatic mitral (valve) annulus calcification: Secondary | ICD-10-CM | POA: Insufficient documentation

## 2022-05-01 DIAGNOSIS — I2510 Atherosclerotic heart disease of native coronary artery without angina pectoris: Secondary | ICD-10-CM | POA: Insufficient documentation

## 2022-05-01 DIAGNOSIS — I5022 Chronic systolic (congestive) heart failure: Secondary | ICD-10-CM

## 2022-05-01 DIAGNOSIS — Z7902 Long term (current) use of antithrombotics/antiplatelets: Secondary | ICD-10-CM | POA: Diagnosis not present

## 2022-05-01 DIAGNOSIS — Z79899 Other long term (current) drug therapy: Secondary | ICD-10-CM | POA: Diagnosis not present

## 2022-05-01 DIAGNOSIS — R197 Diarrhea, unspecified: Secondary | ICD-10-CM | POA: Insufficient documentation

## 2022-05-01 DIAGNOSIS — I5082 Biventricular heart failure: Secondary | ICD-10-CM | POA: Insufficient documentation

## 2022-05-01 DIAGNOSIS — Z7984 Long term (current) use of oral hypoglycemic drugs: Secondary | ICD-10-CM | POA: Diagnosis not present

## 2022-05-01 DIAGNOSIS — E119 Type 2 diabetes mellitus without complications: Secondary | ICD-10-CM | POA: Diagnosis not present

## 2022-05-01 LAB — ECHOCARDIOGRAM COMPLETE
AR max vel: 1.82 cm2
AV Area VTI: 1.63 cm2
AV Area mean vel: 1.71 cm2
AV Mean grad: 2 mmHg
AV Peak grad: 3.1 mmHg
Ao pk vel: 0.88 m/s
Area-P 1/2: 2.88 cm2
S' Lateral: 1.9 cm

## 2022-05-01 LAB — BASIC METABOLIC PANEL
Anion gap: 8 (ref 5–15)
BUN: 8 mg/dL (ref 6–20)
CO2: 26 mmol/L (ref 22–32)
Calcium: 9.1 mg/dL (ref 8.9–10.3)
Chloride: 102 mmol/L (ref 98–111)
Creatinine, Ser: 0.65 mg/dL (ref 0.44–1.00)
GFR, Estimated: >60 mL/min (ref >60)
Glucose, Bld: 261 mg/dL — ABNORMAL HIGH (ref 70–99)
Potassium: 3.4 mmol/L — ABNORMAL LOW (ref 3.5–5.1)
Sodium: 136 mmol/L (ref 135–145)

## 2022-05-01 LAB — BRAIN NATRIURETIC PEPTIDE: B Natriuretic Peptide: 62.3 pg/mL (ref 0.0–100.0)

## 2022-05-01 MED ORDER — DEXCOM G7 SENSOR MISC
2 refills | Status: DC
  Filled 2022-05-01: qty 3, fill #0

## 2022-05-01 NOTE — Progress Notes (Signed)
  Echocardiogram 2D Echocardiogram has been performed.  Brooke [name] 05/01/2022, 2:45 PM

## 2022-05-01 NOTE — Progress Notes (Addendum)
[facility] PROGRESS NOTE   Primary Care: Freeman Caldron, PA-C HF Cardiologist: Dr. Haroldine Laws  Reason for Visit: F/u for Chronic Systolic Heart Failure   HPI: Brooke [name] is a 61 y.o. female w/ DM2, chronic pelvic floor dysfunction, protein calorie malnutrition, and new diagnosis of CAD and systolic heart failure/iCM.   Admitted 3/23 with acute CHF and cardiogenic shock. Had not seen a medical provider in > 2 years. She was started on empiric milrinone and diuresed with IV lasix. Echo with LVEF 20-25%, GIIDD, RV moderately reduced, trivial MR. R/LHC: severe multivessel CAD with severe LV dysfunction, low filling pressures and low output. Milrinone increased and AHF consulted. Not CABG candidate with poor targets, malnutrition and LV systolic dysfunction. Cardiac MRI showed LVEF 24% with < 50% wall thickness LGE in all areas except apex. Coronary anatomy not favorable for PCI. Milrinone weaned off and GDMT titrated. Insulin started with A1C 11.5. Discharged home, weight 100 lbs.  Echo today: EF 55-60%, grade I DD, RV okay  Here today for HF follow-up.  Denies CP, shortness of breath, orthopnea, PND or LE edema. Taking medications as prescribed. Eating well but reports still having trouble gaining weight. Weight averaging in 90s. Diabetes not controlled, last A1c 11.9%      Cardiac Studies: - Echo (3/23): severe biventricular dysfunction, LVEF 20-25%, GIIDD, RV moderately reduced, trivial MR.  - R/LHC (3/23): severe multivessel CAD with severe LV dysfunction, low filling pressures and low output.   Prox RCA lesion is 100% stenosed.   Ost LM lesion is 60% stenosed.   Mid LAD lesion is 80% stenosed.   2nd Diag lesion is 85% stenosed.   1st Diag lesion is 99% stenosed.   Prox LAD lesion is 80% stenosed.   Mid Cx lesion is 70% stenosed.   The left ventricular ejection fraction is 25-35% by visual estimate.   1.  Severe multivessel disease. 2.  Severe LV dysfunction 3.  Fick  cardiac output of 5.2 L/min and Fick cardiac index of 3.4 L a minute per meter squared with a PA saturation of 68%. 4.  Thermodilution outputs done in triplicate demonstrated a cardiac output of 3 L/min and an index of 2 L/min/m; milrinone was increased to 2.5 mcg per kilogram per minute. 5.  Cardiac hemodynamics as follows:   A.  Right atrial pressure 9/7 with a mean of 5 mmHg B.  RV 38/3 with an end-diastolic pressure of 18 mmHg C.  Wedge pressure of 12/11 with a mean of 10 mmHg D.  Pulmonary artery pressure of 23/12 with a mean pressure of 17 mmHg; PAPI =9/5=1.8 E.  LVEDP of 29mHg.  - Cardiac MRI (3/23): showed LVEF 24% with < 50% wall thickness LGE in all areas except apex.   Past Medical History:  Diagnosis Date   Abdominal pain, left lower quadrant 12/17/2015   Chest pain, unspecified 10/05/2003   Depressive disorder, not elsewhere classified 07/31/2005   Diabetes mellitus without complication (HPierz [id number]  Endometriosis 09/14/2012   Hypertension 11/23/2003   Unspecified constipation 06/24/2004   Current Outpatient Medications  Medication Sig Dispense Refill   acetaminophen (TYLENOL) 500 MG tablet Take 1,000 mg by mouth every 6 (six) hours as needed for mild pain, fever or headache.     aspirin 81 MG chewable tablet Chew 1 tablet (81 mg total) by mouth daily. 30 tablet 1   atorvastatin (LIPITOR) 80 MG tablet Take 1 tablet (80 mg total) by mouth daily. 30 tablet 3   clopidogrel (  PLAVIX) 75 MG tablet Take 1 tablet (75 mg total) by mouth daily. 30 tablet 3   dapagliflozin propanediol (FARXIGA) 10 MG TABS tablet Take 1 tablet (10 mg total) by mouth daily. 30 tablet 3   Insulin Lispro Prot & Lispro (HUMALOG MIX 75/25 KWIKPEN) (75-25) 100 UNIT/ML Kwikpen Inject 18 Units into the skin 2 (two) times daily. 15 mL 2   Insulin Pen Needle (PENTIPS) 32G X 4 MM MISC Use as directed with insulin pen 100 each 0   metoprolol succinate (TOPROL-XL) 25 MG 24 hr tablet Take 1 tablet (25 mg  total) by mouth daily. Take with or immediately following a meal. 90 tablet 3   Multiple Vitamin (MULTIVITAMIN WITH MINERALS) TABS tablet Take 1 tablet by mouth daily.     omeprazole (PRILOSEC) 40 MG capsule Take 1 capsule (40 mg total) by mouth daily. 30 capsule 1   sacubitril-valsartan (ENTRESTO) 49-51 MG Take 1 tablet by mouth 2 (two) times daily. 180 tablet 3   spironolactone (ALDACTONE) 25 MG tablet Take 1 tablet (25 mg total) by mouth daily. 30 tablet 3   vitamin B-12 (CYANOCOBALAMIN) 1000 MCG tablet Take 3,000 mcg by mouth daily.     Continuous Blood Gluc Receiver (DEXCOM G7 RECEIVER) DEVI Use to check blood sugar 3 times daily. (Patient not taking: Reported on 05/01/2022) 1 each 0   Continuous Blood Gluc Sensor (DEXCOM G7 SENSOR) MISC Use to check blood sugar 3 times daily. 3 each 2   Continuous Blood Gluc Sensor (DEXCOM G7 SENSOR) MISC Use to check blood sugar 3 times daily. 3 each 2   No current facility-administered medications for this encounter.   Allergies  Allergen Reactions   Aspirin     Intolerance (aspirin sensitive stomach)   Darvocet [Propoxyphene N-Acetaminophen]    Erythromycin    Gabapentin Other (See Comments)    Suicidal ideations    Ibuprofen    Metformin And Related Nausea Only   Social History   Socioeconomic History   Marital status: Married    Spouse name: Not on file   Number of children: Not on file   Years of education: Not on file   Highest education level: Not on file  Occupational History   Not on file  Tobacco Use   Smoking status: Never   Smokeless tobacco: Never  Vaping Use   Vaping Use: Never used  Substance and Sexual Activity   Alcohol use: No   Drug use: No   Sexual activity: Not Currently  Other Topics Concern   Not on file  Social History Narrative   Not on file   Social Determinants of Health   Financial Resource Strain: Medium Risk (10/31/2021)   Overall Financial Resource Strain (CARDIA)    Difficulty of Paying Living  Expenses: Somewhat hard  Food Insecurity: No Food Insecurity (10/31/2021)   Hunger Vital Sign    Worried About Running Out of Food in the Last Year: Never true    Ran Out of Food in the Last Year: Never true  Transportation Needs: No Transportation Needs (10/31/2021)   PRAPARE - Hydrologist (Medical): No    Lack of Transportation (Non-Medical): No  Physical Activity: Not on file  Stress: Not on file  Social Connections: Not on file  Intimate Partner Violence: Not on file   Family History  Problem Relation Age of Onset   Diabetes Mother    Hypertension Mother    Arthritis Mother  RA   Pancreatic cancer Father    Stomach cancer Neg Hx    Colon cancer Neg Hx    Esophageal cancer Neg Hx    Rectal cancer Neg Hx    BP 110/70   Pulse 86   Wt 44.1 kg (97 lb 3.2 oz)   LMP 11/16/2014 (Approximate)   SpO2 97%   BMI 16.68 kg/m   Wt Readings from Last 3 Encounters:  05/01/22 44.1 kg (97 lb 3.2 oz)  04/07/22 39.5 kg (87 lb)  02/20/22 44.5 kg (98 lb 3.2 oz)   PHYSICAL EXAM: General:  Thin. No distress. HEENT: normal Neck: supple. no JVD. Carotids 2+ bilat; no bruits.  Cor: PMI nondisplaced. Regular rate & rhythm. No rubs, gallops or murmurs. Lungs: clear Abdomen: soft, nontender, nondistended.  Extremities: no cyanosis, clubbing, rash, edema Neuro: alert & orientedx3, cranial nerves grossly intact. moves all 4 extremities w/o difficulty. Affect pleasant   ASSESSMENT & PLAN: 1. Chronic Biventricular Heart Failure. - New/iCM  - Echo (3/23): EF 20-25%, RV moderately reduced. - LHC (3/23): w/ severe diffuse MVCAD. - RHC (3/23): on milrinone 0.25 w/ low filling pressures and low CO/CI by thermo, CO 3L/min, CI 2L/m. PAPI 1.8. Milrinone increased to 0.5.  - cMRI (3/23): EF 24%, RVEF 48%, coronary disease type LGE present, LGE < 50% wall thickness in all areas except apex.  - Echo today: EF 55-60% - NYHA II. Euvolemic on exam. She does not need  loop diuretic. - Continue Entresto 49-51 mg bid.  - Continue spironolactone 25 mg daily.  - Continue Farxiga 10 mg daily.   - Continue Toprol XL 25 mg daily  - Discontinue digoxin with recovery in LV function - Labs today  2. CAD - diffuse MV CAD - Prox RCA lesion is 100% stenosed. (RPAV filled by collaterals from Dist Cx) - Ost LM lesion is 60% stenosed. - Mid LAD lesion is 80% stenosed. - 2nd Diag lesion is 85% stenosed. - 1st Diag lesion is 99% stenosed. - Prox LAD lesion is 80% stenosed. - Mid Cx lesion is 70% stenosed. - Not CABG candidate with poor targets, malnutrition - Reviewed with IC team. Not candidate for PCI. - cMRI (3/23): LVEF 24% with < 50% wall thickness LGE in all areas except apex. Coronary anatomy not favorable for PCI - No chest pain. - Continue Plavix + ASA - Continue atorva 80 mg daily. - Continue low dose Toprol XL.   3. Type 2 Diabetes  - Uncontrolled. - Hgb A1c 11.9 - Now on insulin - On SGLT2i. No GU symptoms. - Has established w/ new PCP    4. HLD, LDL goal < 70  - Continue atorva 80 - LDL 78 in July  5. Protein calorie malnutrition - Body mass index is 16.68 kg/m. - Says her appetite is OK, struggles with diarrhea - ? How much uncontrolled diabetes contributing to weight    Follow-up: [facility] as needed, refer to Cardiology for long-term management  [facility], Lynder Parents, PA-C 05/01/22

## 2022-05-01 NOTE — Patient Instructions (Signed)
Good to see you today!  STOP Digoxin    Lab work done today we will call you you with any abnormal results  You have been referred to general cardiology  Congratulations you have graduated for the clinic! Great news

## 2022-05-04 ENCOUNTER — Other Ambulatory Visit (HOSPITAL_COMMUNITY): Payer: Self-pay

## 2022-05-04 ENCOUNTER — Telehealth (HOSPITAL_COMMUNITY): Payer: Self-pay

## 2022-05-04 ENCOUNTER — Other Ambulatory Visit: Payer: Self-pay

## 2022-05-04 MED ORDER — POTASSIUM CHLORIDE CRYS ER 20 MEQ PO TBCR
40.0000 meq | EXTENDED_RELEASE_TABLET | Freq: Once | ORAL | 0 refills | Status: DC
  Filled 2022-05-04: qty 2, 1d supply, fill #0

## 2022-05-04 NOTE — Telephone Encounter (Addendum)
-----   Message from Jolaine Artist, MD sent at 05/01/2022  5:16 PM EDT ----- Have her take kcl 40 x 1   Spoke with patient. Pt aware, agreeable, and verbalized understanding. [facility] on [address] per pt request.

## 2022-05-04 NOTE — Progress Notes (Signed)
Prescription for 40 mEq KCl sent to [facility] on [address] per patient request.

## 2022-05-05 ENCOUNTER — Other Ambulatory Visit: Payer: Self-pay

## 2022-05-07 ENCOUNTER — Other Ambulatory Visit (HOSPITAL_COMMUNITY): Payer: Self-pay | Admitting: Internal Medicine

## 2022-05-07 ENCOUNTER — Other Ambulatory Visit: Payer: Self-pay

## 2022-05-08 ENCOUNTER — Other Ambulatory Visit: Payer: Self-pay

## 2022-05-14 ENCOUNTER — Encounter: Payer: Self-pay | Admitting: Family Medicine

## 2022-05-14 ENCOUNTER — Other Ambulatory Visit (HOSPITAL_COMMUNITY)
Admission: RE | Admit: 2022-05-14 | Discharge: 2022-05-14 | Disposition: A | Discharge status: Home / Self Care | Payer: Medicaid Other | Source: Ambulatory Visit | Attending: Family Medicine | Admitting: Family Medicine

## 2022-05-14 ENCOUNTER — Ambulatory Visit: Payer: Medicaid Other | Attending: Family Medicine | Admitting: Family Medicine

## 2022-05-14 ENCOUNTER — Other Ambulatory Visit: Payer: Self-pay

## 2022-05-14 VITALS — BP 136/72 | HR 78 | Ht 64.0 in | Wt 102.6 lb

## 2022-05-14 DIAGNOSIS — Z1231 Encounter for screening mammogram for malignant neoplasm of breast: Secondary | ICD-10-CM

## 2022-05-14 DIAGNOSIS — R102 Pelvic and perineal pain: Secondary | ICD-10-CM | POA: Diagnosis not present

## 2022-05-14 DIAGNOSIS — T17308A Unspecified foreign body in larynx causing other injury, initial encounter: Secondary | ICD-10-CM

## 2022-05-14 DIAGNOSIS — Z124 Encounter for screening for malignant neoplasm of cervix: Secondary | ICD-10-CM

## 2022-05-14 DIAGNOSIS — E119 Type 2 diabetes mellitus without complications: Secondary | ICD-10-CM | POA: Diagnosis not present

## 2022-05-14 DIAGNOSIS — Z0000 Encounter for general adult medical examination without abnormal findings: Secondary | ICD-10-CM

## 2022-05-14 DIAGNOSIS — G8929 Other chronic pain: Secondary | ICD-10-CM | POA: Diagnosis not present

## 2022-05-14 DIAGNOSIS — Z0001 Encounter for general adult medical examination with abnormal findings: Secondary | ICD-10-CM | POA: Diagnosis not present

## 2022-05-14 NOTE — Progress Notes (Signed)
Pelvic floor pain

## 2022-05-14 NOTE — Patient Instructions (Signed)
Health Maintenance After Age 59 After age 59, you are at a higher risk for certain long-term diseases and infections as well as injuries from falls. Falls are a major cause of broken bones and head injuries in people who are older than age 59. Getting regular preventive care can help to keep you healthy and well. Preventive care includes getting regular testing and making lifestyle changes as recommended by your health care provider. Talk with your health care provider about: Which screenings and tests you should have. A screening is a test that checks for a disease when you have no symptoms. A diet and exercise plan that is right for you. What should I know about screenings and tests to prevent falls? Screening and testing are the best ways to find a health problem early. Early diagnosis and treatment give you the best chance of managing medical conditions that are common after age 59. Certain conditions and lifestyle choices may make you more likely to have a fall. Your health care provider may recommend: Regular vision checks. Poor vision and conditions such as cataracts can make you more likely to have a fall. If you wear glasses, make sure to get your prescription updated if your vision changes. Medicine review. Work with your health care provider to regularly review all of the medicines you are taking, including over-the-counter medicines. Ask your health care provider about any side effects that may make you more likely to have a fall. Tell your health care provider if any medicines that you take make you feel dizzy or sleepy. Strength and balance checks. Your health care provider may recommend certain tests to check your strength and balance while standing, walking, or changing positions. Foot health exam. Foot pain and numbness, as well as not wearing proper footwear, can make you more likely to have a fall. Screenings, including: Osteoporosis screening. Osteoporosis is a condition that causes  the bones to get weaker and break more easily. Blood pressure screening. Blood pressure changes and medicines to control blood pressure can make you feel dizzy. Depression screening. You may be more likely to have a fall if you have a fear of falling, feel depressed, or feel unable to do activities that you used to do. Alcohol use screening. Using too much alcohol can affect your balance and may make you more likely to have a fall. Follow these instructions at home: Lifestyle Do not drink alcohol if: Your health care provider tells you not to drink. If you drink alcohol: Limit how much you have to: 0-1 drink a day for women. 0-2 drinks a day for men. Know how much alcohol is in your drink. In the [place] one drink equals one 12 oz bottle of beer (355 mL), one 5 oz glass of wine (148 mL), or one 1 oz glass of hard liquor (44 mL). Do not use any products that contain nicotine or tobacco. These products include cigarettes, chewing tobacco, and vaping devices, such as e-cigarettes. If you need help quitting, ask your health care provider. Activity  Follow a regular exercise program to stay fit. This will help you maintain your balance. Ask your health care provider what types of exercise are appropriate for you. If you need a cane or walker, use it as recommended by your health care provider. Wear supportive shoes that have nonskid soles. Safety  Remove any tripping hazards, such as rugs, cords, and clutter. Install safety equipment such as grab bars in bathrooms and safety rails on stairs. Keep rooms and walkways   well-lit. General instructions Talk with your health care provider about your risks for falling. Tell your health care provider if: You fall. Be sure to tell your health care provider about all falls, even ones that seem minor. You feel dizzy, tiredness (fatigue), or off-balance. Take over-the-counter and prescription medicines only as told by your health care provider. These include  supplements. Eat a healthy diet and maintain a healthy weight. A healthy diet includes low-fat dairy products, low-fat (lean) meats, and fiber from whole grains, beans, and lots of fruits and vegetables. Stay current with your vaccines. Schedule regular health, dental, and eye exams. Summary Having a healthy lifestyle and getting preventive care can help to protect your health and wellness after age 59. Screening and testing are the best way to find a health problem early and help you avoid having a fall. Early diagnosis and treatment give you the best chance for managing medical conditions that are more common for people who are older than age 59. Falls are a major cause of broken bones and head injuries in people who are older than age 59. Take precautions to prevent a fall at home. Work with your health care provider to learn what changes you can make to improve your health and wellness and to prevent falls. This information is not intended to replace advice given to you by your health care provider. Make sure you discuss any questions you have with your health care provider. Document Revised: 12/16/2020 Document Reviewed: 12/16/2020 Elsevier Patient Education  2023 [employer]  

## 2022-05-14 NOTE — Progress Notes (Signed)
Subjective:  Patient ID: Brooke [name], female    DOB: 08-03-1961  Age: 61 y.o. MRN: [id number]  CC: Annual Exam and Gynecologic Exam   HPI Brooke [name] is a 61 y.o. year old female with a history of type 2 diabetes mellitus, HFrEF (EF 55 to 60% on echo of 04/2022 improved from 20 to 25%), CAD, hypertension, anxiety and depression, protein calorie malnutrition She presents today for an annual physical exam.  Interval History: She exercises 30-60 minutes/ day and she has an adequate intake of fruits and veggies. Up-to-date on colonoscopy but is due for breast cancer and cervical cancer screening.  She has had chronic pelvic floor pain which has been present for years and she was seeing a specialist and was about to start  receiving "injections" but her insurance changed. Also underwent pelvic floor rehab which made it worse. States she was also diagnosed with Endometriosis.  Sometimes she chokes on her pills and thinks it is worsening.  Spouse interjects and states that she chokes even when she eats food. Past Medical History:  Diagnosis Date   Abdominal pain, left lower quadrant 12/17/2015   Chest pain, unspecified 10/05/2003   Depressive disorder, not elsewhere classified 07/31/2005   Diabetes mellitus without complication ([place]) 96/29/5284   Endometriosis 09/14/2012   Hypertension 11/23/2003   Unspecified constipation 06/24/2004    Past Surgical History:  Procedure Laterality Date   APPENDECTOMY  1996   Dr. Lindon Romp   BREAST SURGERY Bilateral 1996   Dr. Haskell Riling HERNIA REPAIR     OVARIAN CYST REMOVAL     RIGHT/LEFT HEART CATH AND CORONARY ANGIOGRAPHY N/A 10/09/2021   Procedure: RIGHT/LEFT HEART CATH AND CORONARY ANGIOGRAPHY;  Surgeon: Early Osmond, MD;  Location: [facility];  Service: Cardiovascular;  Laterality: N/A;    Family History  Problem Relation Age of Onset   Diabetes Mother    Hypertension Mother    Arthritis Mother        RA   Pancreatic  cancer Father    Stomach cancer Neg Hx    Colon cancer Neg Hx    Esophageal cancer Neg Hx    Rectal cancer Neg Hx     Social History   Socioeconomic History   Marital status: Married    Spouse name: Not on file   Number of children: Not on file   Years of education: Not on file   Highest education level: Not on file  Occupational History   Not on file  Tobacco Use   Smoking status: Never   Smokeless tobacco: Never  Vaping Use   Vaping Use: Never used  Substance and Sexual Activity   Alcohol use: No   Drug use: No   Sexual activity: Not Currently  Other Topics Concern   Not on file  Social History Narrative   Not on file   Social Determinants of Health   Financial Resource Strain: Medium Risk (10/31/2021)   Overall Financial Resource Strain (CARDIA)    Difficulty of Paying Living Expenses: Somewhat hard  Food Insecurity: No Food Insecurity (10/31/2021)   Hunger Vital Sign    Worried About Running Out of Food in the Last Year: Never true    Ran Out of Food in the Last Year: Never true  Transportation Needs: No Transportation Needs (10/31/2021)   PRAPARE - Hydrologist (Medical): No    Lack of Transportation (Non-Medical): No  Physical Activity: Not on file  Stress: Not  on file  Social Connections: Not on file    Allergies  Allergen Reactions   Aspirin     Intolerance (aspirin sensitive stomach)   Darvocet [Propoxyphene N-Acetaminophen]    Erythromycin    Gabapentin Other (See Comments)    Suicidal ideations    Ibuprofen    Metformin And Related Nausea Only    Outpatient Medications Prior to Visit  Medication Sig Dispense Refill   acetaminophen (TYLENOL) 500 MG tablet Take 1,000 mg by mouth every 6 (six) hours as needed for mild pain, fever or headache.     aspirin 81 MG chewable tablet Chew 1 tablet (81 mg total) by mouth daily. 30 tablet 1   atorvastatin (LIPITOR) 80 MG tablet Take 1 tablet (80 mg total) by mouth daily. 30  tablet 3   clopidogrel (PLAVIX) 75 MG tablet Take 1 tablet (75 mg total) by mouth daily. 30 tablet 3   Continuous Blood Gluc Receiver (DEXCOM G7 RECEIVER) DEVI Use to check blood sugar 3 times daily. 1 each 0   Continuous Blood Gluc Sensor (DEXCOM G7 SENSOR) MISC Use to check blood sugar 3 times daily. 3 each 2   Continuous Blood Gluc Sensor (DEXCOM G7 SENSOR) MISC Use to check blood sugar 3 times daily. 3 each 2   dapagliflozin propanediol (FARXIGA) 10 MG TABS tablet Take 1 tablet (10 mg total) by mouth daily. 30 tablet 3   Insulin Lispro Prot & Lispro (HUMALOG MIX 75/25 KWIKPEN) (75-25) 100 UNIT/ML Kwikpen Inject 18 Units into the skin 2 (two) times daily. 15 mL 2   Insulin Pen Needle (PENTIPS) 32G X 4 MM MISC Use as directed with insulin pen 100 each 0   metoprolol succinate (TOPROL-XL) 25 MG 24 hr tablet Take 1 tablet (25 mg total) by mouth daily. Take with or immediately following a meal. 90 tablet 3   Multiple Vitamin (MULTIVITAMIN WITH MINERALS) TABS tablet Take 1 tablet by mouth daily.     omeprazole (PRILOSEC) 40 MG capsule Take 1 capsule (40 mg total) by mouth daily. 30 capsule 1   sacubitril-valsartan (ENTRESTO) 49-51 MG Take 1 tablet by mouth 2 (two) times daily. 180 tablet 3   spironolactone (ALDACTONE) 25 MG tablet Take 1 tablet (25 mg total) by mouth daily. 30 tablet 3   vitamin B-12 (CYANOCOBALAMIN) 1000 MCG tablet Take 3,000 mcg by mouth daily.     potassium chloride SA (KLOR-CON M) 20 MEQ tablet Take 2 tablets (40 mEq total) by mouth once for 1 dose. 2 tablet 0   No facility-administered medications prior to visit.     ROS Review of Systems  Constitutional:  Negative for activity change, appetite change and fatigue.  HENT:  Negative for congestion, sinus pressure and sore throat.   Eyes:  Negative for visual disturbance.  Respiratory:  Negative for cough, chest tightness, shortness of breath and wheezing.   Cardiovascular:  Negative for chest pain and palpitations.   Gastrointestinal:  Negative for abdominal distention, abdominal pain and constipation.  Endocrine: Negative for polydipsia.  Genitourinary:  Positive for pelvic pain. Negative for dysuria and frequency.  Musculoskeletal:  Negative for arthralgias and back pain.  Skin:  Negative for rash.  Neurological:  Negative for tremors, light-headedness and numbness.  Hematological:  Does not bruise/bleed easily.  Psychiatric/Behavioral:  Negative for agitation and behavioral problems.     Objective:  BP 136/72   Pulse 78   Ht '5\' 4"'$  (1.626 m)   Wt 102 lb 9.6 oz (46.5 kg)   LMP  11/16/2014 (Approximate)   SpO2 100%   BMI 17.61 kg/m      05/14/2022   10:29 AM 05/01/2022    3:05 PM 04/07/2022    8:52 AM  BP/Weight  Systolic BP 505 397 673  Diastolic BP 72 70 79  Wt. (Lbs) 102.6 97.2 87  BMI 17.61 kg/m2 16.68 kg/m2 14.93 kg/m2      Physical Exam Exam conducted with a chaperone present.  Constitutional:      General: She is not in acute distress.    Appearance: She is well-developed. She is not diaphoretic.  HENT:     Head: Normocephalic.     Right Ear: External ear normal.     Left Ear: External ear normal.     Nose: Nose normal.  Eyes:     Conjunctiva/sclera: Conjunctivae normal.     Pupils: Pupils are equal, round, and reactive to light.  Neck:     Vascular: No JVD.  Cardiovascular:     Rate and Rhythm: Normal rate and regular rhythm.     Heart sounds: Normal heart sounds. No murmur heard.    No gallop.  Pulmonary:     Effort: Pulmonary effort is normal. No respiratory distress.     Breath sounds: Normal breath sounds. No wheezing or rales.  Chest:     Chest wall: No tenderness.  Breasts:    Right: Normal. No mass, nipple discharge or tenderness.     Left: Normal. No mass, nipple discharge or tenderness.  Abdominal:     General: Bowel sounds are normal. There is no distension.     Palpations: Abdomen is soft. There is no mass.     Tenderness: There is no abdominal  tenderness.     Hernia: There is no hernia in the left inguinal area or right inguinal area.  Genitourinary:    General: Normal vulva.     Pubic Area: No rash.      Labia:        Right: No rash.        Left: No rash.      Vagina: Normal.     Cervix: Normal.     Uterus: Normal.      Adnexa:        Right: Tenderness present.        Left: Tenderness present.   Musculoskeletal:        General: No tenderness. Normal range of motion.     Cervical back: Normal range of motion. No tenderness.  Lymphadenopathy:     Upper Body:     Right upper body: No supraclavicular or axillary adenopathy.     Left upper body: No supraclavicular or axillary adenopathy.  Skin:    General: Skin is warm and dry.  Neurological:     Mental Status: She is alert and oriented to person, place, and time.     Deep Tendon Reflexes: Reflexes are normal and symmetric.        Latest Ref Rng & Units 05/01/2022    4:14 PM 02/20/2022    3:48 PM 12/02/2021    2:33 PM  CMP  Glucose 70 - 99 mg/dL 261  467  257   BUN 6 - 20 mg/dL '8  10  9   '$ Creatinine 0.44 - 1.00 mg/dL 0.65  0.79  0.73   Sodium 135 - 145 mmol/L 136  135  137   Potassium 3.5 - 5.1 mmol/L 3.4  4.8  4.5   Chloride 98 - 111 mmol/L 102  97  103   CO2 22 - 32 mmol/L '26  25  28   '$ Calcium 8.9 - 10.3 mg/dL 9.1  9.4  9.4   Total Protein 6.5 - 8.1 g/dL  7.4    Total Bilirubin 0.3 - 1.2 mg/dL  0.3    Alkaline Phos 38 - 126 U/L  70    AST 15 - 41 U/L  27    ALT 0 - 44 U/L  32      Lipid Panel     Component Value Date/Time   CHOL 147 02/20/2022 1548   CHOL 192 12/13/2015 0856   TRIG 134 02/20/2022 1548   HDL 42 02/20/2022 1548   HDL 41 12/13/2015 0856   CHOLHDL 3.5 02/20/2022 1548   VLDL 27 02/20/2022 1548   LDLCALC 78 02/20/2022 1548   LDLCALC 118 (H) 12/13/2015 0856    CBC    Component Value Date/Time   WBC 10.6 (H) 10/17/2021 0459   RBC 5.38 (H) 10/17/2021 0459   HGB 13.3 10/17/2021 0459   HCT 40.7 10/17/2021 0459   PLT 376 10/17/2021  0459   MCV 75.7 (L) 10/17/2021 0459   MCH 24.7 (L) 10/17/2021 0459   MCHC 32.7 10/17/2021 0459   RDW 13.9 10/17/2021 0459   LYMPHSABS 1.1 10/08/2021 1947   MONOABS 0.7 10/08/2021 1947   EOSABS 0.0 10/08/2021 1947   BASOSABS 0.0 10/08/2021 1947    Lab Results  Component Value Date   HGBA1C 11.9 (A) 04/07/2022    Assessment & Plan:  1. Annual physical exam Counseled on 150 minutes of exercise per week, healthy eating (including decreased daily intake of saturated fats, cholesterol, added sugars, sodium),  routine healthcare maintenance.   2. Encounter for screening mammogram for malignant neoplasm of breast - MM DIGITAL SCREENING BILATERAL; Future  3. Screening for cervical cancer - Cytology - PAP  4. Chronic pelvic pain in female With history of endometriosis - Ambulatory referral to Gynecology  5. Diabetes mellitus without complication ([place]) Uncontrolled with A1c of 11.9 Regimen changes made at last visit and we will follow-up at next visit - Microalbumin/Creatinine Ratio, Urine  6. Choking, initial encounter - DG ESOPHAGUS W SINGLE CM (SOL OR THIN BA); Future    No orders of the defined types were placed in this encounter.   Follow-up: Return in about 3 months (around 08/14/2022) for Chronic medical conditions.       Brooke Rakes, MD, FAAFP. [facility] and [place], [place]   05/14/2022, 12:12 PM

## 2022-05-15 ENCOUNTER — Other Ambulatory Visit: Payer: Self-pay

## 2022-05-15 LAB — MICROALBUMIN / CREATININE URINE RATIO
Creatinine, Urine: 48 mg/dL
Microalb/Creat Ratio: 246 mg/g creat — ABNORMAL HIGH (ref 0–29)
Microalbumin, Urine: 118.1 ug/mL

## 2022-05-18 LAB — CYTOLOGY - PAP
Comment: NEGATIVE
Diagnosis: NEGATIVE
High risk HPV: NEGATIVE

## 2022-05-20 ENCOUNTER — Other Ambulatory Visit: Payer: Self-pay | Admitting: Family Medicine

## 2022-05-20 ENCOUNTER — Ambulatory Visit (HOSPITAL_COMMUNITY)
Admission: RE | Admit: 2022-05-20 | Discharge: 2022-05-20 | Disposition: A | Discharge status: Home / Self Care | Payer: Medicaid Other | Source: Ambulatory Visit | Attending: Family Medicine | Admitting: Family Medicine

## 2022-05-20 DIAGNOSIS — T17308A Unspecified foreign body in larynx causing other injury, initial encounter: Secondary | ICD-10-CM | POA: Insufficient documentation

## 2022-05-20 DIAGNOSIS — R1310 Dysphagia, unspecified: Secondary | ICD-10-CM | POA: Diagnosis not present

## 2022-05-20 DIAGNOSIS — K224 Dyskinesia of esophagus: Secondary | ICD-10-CM | POA: Diagnosis not present

## 2022-05-21 ENCOUNTER — Other Ambulatory Visit: Payer: Self-pay

## 2022-05-22 ENCOUNTER — Other Ambulatory Visit: Payer: Self-pay

## 2022-05-29 ENCOUNTER — Other Ambulatory Visit: Payer: Self-pay

## 2022-06-02 ENCOUNTER — Other Ambulatory Visit: Payer: Self-pay

## 2022-06-02 ENCOUNTER — Other Ambulatory Visit: Payer: Self-pay | Admitting: Family Medicine

## 2022-06-02 DIAGNOSIS — E1165 Type 2 diabetes mellitus with hyperglycemia: Secondary | ICD-10-CM

## 2022-06-02 MED ORDER — DEXCOM G7 RECEIVER DEVI
0 refills | Status: DC
  Filled 2022-06-02 – 2022-06-23 (×2): qty 1, fill #0

## 2022-06-03 ENCOUNTER — Other Ambulatory Visit: Payer: Self-pay

## 2022-06-04 ENCOUNTER — Encounter: Payer: Self-pay | Admitting: Cardiology

## 2022-06-04 ENCOUNTER — Ambulatory Visit: Payer: Medicaid Other | Attending: Cardiology | Admitting: Cardiology

## 2022-06-04 VITALS — BP 120/70 | HR 80 | Ht 64.0 in | Wt 111.0 lb

## 2022-06-04 DIAGNOSIS — E785 Hyperlipidemia, unspecified: Secondary | ICD-10-CM

## 2022-06-04 DIAGNOSIS — Z794 Long term (current) use of insulin: Secondary | ICD-10-CM | POA: Diagnosis not present

## 2022-06-04 DIAGNOSIS — Z79899 Other long term (current) drug therapy: Secondary | ICD-10-CM | POA: Diagnosis not present

## 2022-06-04 DIAGNOSIS — E119 Type 2 diabetes mellitus without complications: Secondary | ICD-10-CM

## 2022-06-04 DIAGNOSIS — I2510 Atherosclerotic heart disease of native coronary artery without angina pectoris: Secondary | ICD-10-CM | POA: Diagnosis not present

## 2022-06-04 DIAGNOSIS — E46 Unspecified protein-calorie malnutrition: Secondary | ICD-10-CM | POA: Diagnosis not present

## 2022-06-04 DIAGNOSIS — I5022 Chronic systolic (congestive) heart failure: Secondary | ICD-10-CM | POA: Diagnosis not present

## 2022-06-04 NOTE — Progress Notes (Signed)
Cardiology Office Note:    Date:  06/04/2022   ID:  Brooke [name], DOB 08/17/1960, MRN [id number]  PCP:  Charlott Rakes, MD   [facility] Providers Cardiologist:  Candee Furbish, MD     Referring MD: Jolaine Artist, MD   History of Present Illness:    Brooke [name] is a 61 y.o. female with here for the evaluation of chronic systolic heart failure.   Admitted 3/23 with acute CHF and cardiogenic shock. Had not seen a medical provider in > 2 years. She was started on empiric milrinone and diuresed with IV lasix. Echo with LVEF 20-25%, GIIDD, RV moderately reduced, trivial MR. R/LHC: severe multivessel CAD with severe LV dysfunction, low filling pressures and low output. Milrinone increased and AHF consulted. Not CABG candidate with poor targets, malnutrition and LV systolic dysfunction. Cardiac MRI showed LVEF 24% with < 50% wall thickness LGE in all areas except apex. Coronary anatomy not favorable for PCI. Milrinone weaned off and GDMT titrated. Insulin started with A1C 11.5. Discharged home, weight 100 lbs.   Echo on 05/01/2022 EF 55-60%, grade I DD, RV okay  Today, the patient states that she has been doing well. She states that she is back to doing a lot of what she has to do. Her main drawback is her left leg nerve pain, she had been given Epiduro shots in her back to help with the pain which helped until she bumped her back. She states that it made her "like an old car that needs an alignment". She has had 4 falls recently due to the issues with her left leg.  She has no complaints and hasn't noticed any cardiac symptoms since her catheterization.  Her husband is currently in the hospital with a brain bleed and she states that she has been doing ok. Her friend had died recently, and she is taking care of her dogs, one of which died recently as well.  She has been compliant with all her medications  She states that she doesn't drink alcohol or smoke.  He/She denies any  palpitations, chest pain, or shortness of breath. No lightheadedness, headaches, syncope, orthopnea, or PND. Also has no lower extremity edema or exertional symptoms.  (+) Left leg Nerve pain  Past Medical History:  Diagnosis Date   Abdominal pain, left lower quadrant 12/17/2015   Chest pain, unspecified 10/05/2003   Depressive disorder, not elsewhere classified 07/31/2005   Diabetes mellitus without complication ([place]) 52/84/1324   Endometriosis 09/14/2012   Hypertension 11/23/2003   Unspecified constipation 06/24/2004    Past Surgical History:  Procedure Laterality Date   APPENDECTOMY  1996   Dr. Lindon Romp   BREAST SURGERY Bilateral 1996   Dr. Haskell Riling HERNIA REPAIR     OVARIAN CYST REMOVAL     RIGHT/LEFT HEART CATH AND CORONARY ANGIOGRAPHY N/A 10/09/2021   Procedure: RIGHT/LEFT HEART CATH AND CORONARY ANGIOGRAPHY;  Surgeon: Early Osmond, MD;  Location: [facility];  Service: Cardiovascular;  Laterality: N/A;    Current Medications: Current Meds  Medication Sig   acetaminophen (TYLENOL) 500 MG tablet Take 1,000 mg by mouth every 6 (six) hours as needed for mild pain, fever or headache.   aspirin 81 MG chewable tablet Chew 1 tablet (81 mg total) by mouth daily.   atorvastatin (LIPITOR) 80 MG tablet Take 1 tablet (80 mg total) by mouth daily.   clopidogrel (PLAVIX) 75 MG tablet Take 1 tablet (75 mg total) by mouth daily.   Continuous  Blood Gluc Receiver (DEXCOM G7 RECEIVER) DEVI Use to check blood sugar 3 times daily.   Continuous Blood Gluc Sensor (DEXCOM G7 SENSOR) MISC Use to check blood sugar 3 times daily.   dapagliflozin propanediol (FARXIGA) 10 MG TABS tablet Take 1 tablet (10 mg total) by mouth daily.   Insulin Lispro Prot & Lispro (HUMALOG MIX 75/25 KWIKPEN) (75-25) 100 UNIT/ML Kwikpen Inject 18 Units into the skin 2 (two) times daily.   Insulin Pen Needle (PENTIPS) 32G X 4 MM MISC Use as directed with insulin pen   metoprolol succinate (TOPROL-XL) 25 MG  24 hr tablet Take 1 tablet (25 mg total) by mouth daily. Take with or immediately following a meal.   Multiple Vitamin (MULTIVITAMIN WITH MINERALS) TABS tablet Take 1 tablet by mouth daily.   omeprazole (PRILOSEC) 40 MG capsule Take 1 capsule (40 mg total) by mouth daily.   sacubitril-valsartan (ENTRESTO) 49-51 MG Take 1 tablet by mouth 2 (two) times daily.   spironolactone (ALDACTONE) 25 MG tablet Take 1 tablet (25 mg total) by mouth daily.   vitamin B-12 (CYANOCOBALAMIN) 1000 MCG tablet Take 3,000 mcg by mouth daily.     Allergies:   Aspirin, Darvocet [propoxyphene n-acetaminophen], Erythromycin, Gabapentin, Ibuprofen, and Metformin and related   Social History   Socioeconomic History   Marital status: Married    Spouse name: Not on file   Number of children: Not on file   Years of education: Not on file   Highest education level: Not on file  Occupational History   Not on file  Tobacco Use   Smoking status: Never   Smokeless tobacco: Never  Vaping Use   Vaping Use: Never used  Substance and Sexual Activity   Alcohol use: No   Drug use: No   Sexual activity: Not Currently  Other Topics Concern   Not on file  Social History Narrative   Not on file   Social Determinants of Health   Financial Resource Strain: Medium Risk (10/31/2021)   Overall Financial Resource Strain (CARDIA)    Difficulty of Paying Living Expenses: Somewhat hard  Food Insecurity: No Food Insecurity (10/31/2021)   Hunger Vital Sign    Worried About Running Out of Food in the Last Year: Never true    Ran Out of Food in the Last Year: Never true  Transportation Needs: No Transportation Needs (10/31/2021)   PRAPARE - Hydrologist (Medical): No    Lack of Transportation (Non-Medical): No  Physical Activity: Not on file  Stress: Not on file  Social Connections: Not on file     Family History: The patient's family history includes Arthritis in her mother; Diabetes in her  mother; Hypertension in her mother; Pancreatic cancer in her father. There is no history of Stomach cancer, Colon cancer, Esophageal cancer, or Rectal cancer.  ROS:   Please see the history of present illness.     All other systems reviewed and are negative.  EKGs/Labs/Other Studies Reviewed:    The following studies were reviewed today:  ECHO 05/01/2022:   EF significantly improved since TTE done 10/09/21. Left ventricular ejection fraction, by estimation, is 55 to 60%. The left ventricle has normal function. The left ventricle has no regional wall motion abnormalities. Left ventricular diastolic parameters are consistent with Grade I diastolic dysfunction (impaired relaxation). 2. Right ventricular systolic function is normal. The right ventricular size is normal. The mitral valve is abnormal. No evidence of mitral valve regurgitation. No evidence of mitral  stenosis. Moderate mitral annular calcification. 3. The aortic valve was not well visualized. Aortic valve regurgitation is not visualized. No aortic stenosis is present. 4. The inferior vena cava is normal in size with greater than 50% respiratory variability, suggesting right atrial pressure of 3 mmHg.  Right/Left Heart Cath 10/09/2021:    Prox RCA lesion is 100% stenosed.   Ost LM lesion is 60% stenosed.   Mid LAD lesion is 80% stenosed.   2nd Diag lesion is 85% stenosed.   1st Diag lesion is 99% stenosed.   Prox LAD lesion is 80% stenosed.   Mid Cx lesion is 70% stenosed.   The left ventricular ejection fraction is 25-35% by visual estimate.   1.  Severe multivessel disease. 2.  Severe LV dysfunction 3.  Fick cardiac output of 5.2 L/min and Fick cardiac index of 3.4 L a minute per meter squared with a PA saturation of 68%. 4.  Thermodilution outputs done in triplicate demonstrated a cardiac output of 3 L/min and an index of 2 L/min/m; milrinone was increased to 2.5 mcg per kilogram per minute. 5.  Cardiac hemodynamics  as follows:   A.  Right atrial pressure 9/7 with a mean of 5 mmHg B.  RV 38/3 with an end-diastolic pressure of 18 mmHg C.  Wedge pressure of 12/11 with a mean of 10 mmHg D.  Pulmonary artery pressure of 23/12 with a mean pressure of 17 mmHg; PAPI =9/5=1.8 E.  LVEDP of 67mHg.   The results were reviewed with Dr. BHaroldine Laws  The patient be transferred to the to heart unit for medical optimization.  No mechanical circulatory support was pursued.  Diagnostic Dominance: Right  EKG:  EKG is personally reviewed  10/31/21 ([facility] FNP) : NSR 85 bpm   Recent Labs: 10/09/2021: TSH 2.060 10/17/2021: Hemoglobin 13.3; Magnesium 2.0; Platelets 376 02/20/2022: ALT 32 05/01/2022: B Natriuretic Peptide 62.3; BUN 8; Creatinine, Ser 0.65; Potassium 3.4; Sodium 136   Recent Lipid Panel    Component Value Date/Time   CHOL 147 02/20/2022 1548   CHOL 192 12/13/2015 0856   TRIG 134 02/20/2022 1548   HDL 42 02/20/2022 1548   HDL 41 12/13/2015 0856   CHOLHDL 3.5 02/20/2022 1548   VLDL 27 02/20/2022 1548   LDLCALC 78 02/20/2022 1548   LDLCALC 118 (H) 12/13/2015 0856     Risk Assessment/Calculations:          Physical Exam:    VS:  BP 120/70 (BP Location: Left Arm, Patient Position: Sitting, Cuff Size: Normal)   Pulse 80   Ht '5\' 4"'$  (1.626 m)   Wt 111 lb (50.3 kg)   LMP 11/16/2014 (Approximate)   SpO2 98%   BMI 19.05 kg/m     Wt Readings from Last 3 Encounters:  06/04/22 111 lb (50.3 kg)  05/14/22 102 lb 9.6 oz (46.5 kg)  05/01/22 97 lb 3.2 oz (44.1 kg)     GEN: Thin well developed in no acute distress HEENT: Normal NECK: No JVD; No carotid bruits LYMPHATICS: No lymphadenopathy CARDIAC: RRR, no murmurs, rubs, gallops RESPIRATORY:  Clear to auscultation without rales, wheezing or rhonchi  ABDOMEN: Soft, non-tender, non-distended MUSCULOSKELETAL:  No edema; No deformity  SKIN: Warm and dry NEUROLOGIC:  Alert and oriented x 3 PSYCHIATRIC:  Normal affect   ASSESSMENT:     1. Chronic systolic heart failure ([place]   2. CAD in native artery   3. Type 2 diabetes mellitus without complication, with long-term current use of insulin ([place]  4. Hyperlipidemia, unspecified hyperlipidemia type   5. Protein-calorie malnutrition, unspecified severity ([place])   6. Medication management    PLAN:    In order of problems listed above:  1. Chronic Biventricular Heart Failure. - New/ICM  - Echo (3/23): EF 20-25%, RV moderately reduced. - LHC (3/23): w/ severe diffuse MVCAD. - RHC (3/23): on milrinone 0.25 w/ low filling pressures and low CO/CI by thermo, CO 3L/min, CI 2L/m. PAPI 1.8. Milrinone increased to 0.5.  - cMRI (3/23): EF 24%, RVEF 48%, coronary disease type LGE present, LGE < 50% wall thickness in all areas except apex.  - Echo today 05/01/22: EF 55-60% - NYHA II. Euvolemic on exam. She does not need loop diuretic. - Continue Entresto 49-51 mg bid.  - Continue spironolactone 25 mg daily.  - Continue Farxiga 10 mg daily.   - Continue Toprol XL 25 mg daily  - Discontinued digoxin with recovery in LV function - Labs today basic metabolic profile given medical therapy.   2. CAD - diffuse multivessel CAD - Prox RCA lesion is 100% stenosed. (RPAV filled by collaterals from Dist Cx) - Ost LM lesion is 60% stenosed. - Mid LAD lesion is 80% stenosed. - 2nd Diag lesion is 85% stenosed. - 1st Diag lesion is 99% stenosed. - Prox LAD lesion is 80% stenosed. - Mid Cx lesion is 70% stenosed. - Not CABG candidate with poor targets, malnutrition - Reviewed with IC team. Not candidate for PCI. - cMRI (3/23): LVEF 24% with < 50% wall thickness LGE in all areas except apex. Coronary anatomy not favorable for PCI - No chest pain. - Continue Plavix + ASA - Continue atorva 80 mg daily. - Continue low dose Toprol XL.  Excellent goal-directed medical therapy.   3. Type 2 Diabetes  - Uncontrolled. -Prior Hgb A1c 11.9 - Now on insulin - On SGLT2i. No GU symptoms. - Has  established w/ new PCP.  Working hard on this.   4. HLD, LDL goal < 70  - Continue atorva 80 - LDL 78 in July, goal less than 70.  No myalgias.   5. Protein calorie malnutrition - Body mass index is 19 kg/m. - Says her appetite is OK, struggles with diarrhea - ? How much uncontrolled diabetes contributing to weight.  Continue to work on this.  6.  Left lower extremity, thigh neuropathy -Has had injections in her back.  Some disequilibrium, occasional fall.  Has several dogs at home.   Extensive review of prior hospital notes data catheterization echocardiogram advanced heart failure notes, Dr. Clayborne Dana notes.       Follow Up: 4 months with APP   Medication Adjustments/Labs and Tests Ordered: Current medicines are reviewed at length with the patient today.  Concerns regarding medicines are outlined above.  Orders Placed This Encounter  Procedures   Basic metabolic panel   No orders of the defined types were placed in this encounter.  Patient Instructions  Medication Instructions:  The current medical regimen is effective;  continue present plan and medications.  *If you need a refill on your cardiac medications before your next appointment, please call your pharmacy*   Lab Work: Please have blood work today (BMP) If you have labs (blood work) drawn today and your tests are completely normal, you will receive your results only by: [place] (if you have MyChart) OR A paper copy in the mail If you have any lab test that is abnormal or we need to change your treatment, we will call you  to review the results.   Follow-Up: At [facility], you and your health needs are our priority.  As part of our continuing mission to provide you with exceptional heart care, we have created designated Provider Care Teams.  These Care Teams include your primary Cardiologist (physician) and Advanced Practice Providers (APPs -  Physician Assistants and Nurse Practitioners)  who all work together to provide you with the care you need, when you need it.  We recommend signing up for the patient portal called "MyChart".  Sign up information is provided on this After Visit Summary.  MyChart is used to connect with patients for Virtual Visits (Telemedicine).  Patients are able to view lab/test results, encounter notes, upcoming appointments, etc.  Non-urgent messages can be sent to your provider as well.   To learn more about what you can do with MyChart, go to [email or web address].    Your next appointment:   4 month(s)  The format for your next appointment:   In Person  Provider:   Nicholes Rough, PA-C, Ambrose Pancoast, NP, Ermalinda Barrios, PA-C, Christen Bame, NP, or Richardson Dopp, PA-C          Important Information About Sugar         I,Coren O'Brien,acting as a scribe for Candee Furbish, MD.,have documented all relevant documentation on the behalf of Candee Furbish, MD,as directed by  Candee Furbish, MD while in the presence of Candee Furbish, MD.   I, Candee Furbish, MD, have reviewed all documentation for this visit. The documentation on 06/04/22 for the exam, diagnosis, procedures, and orders are all accurate and complete.   Signed, Candee Furbish, MD  06/04/2022 10:21 AM    [facility]

## 2022-06-04 NOTE — Patient Instructions (Signed)
Medication Instructions:  The current medical regimen is effective;  continue present plan and medications.  *If you need a refill on your cardiac medications before your next appointment, please call your pharmacy*   Lab Work: Please have blood work today (BMP) If you have labs (blood work) drawn today and your tests are completely normal, you will receive your results only by: MyChart Message (if you have MyChart) OR A paper copy in the mail If you have any lab test that is abnormal or we need to change your treatment, we will call you to review the results.   Follow-Up: At [facility], you and your health needs are our priority.  As part of our continuing mission to provide you with exceptional heart care, we have created designated Provider Care Teams.  These Care Teams include your primary Cardiologist (physician) and Advanced Practice Providers (APPs -  Physician Assistants and Nurse Practitioners) who all work together to provide you with the care you need, when you need it.  We recommend signing up for the patient portal called "MyChart".  Sign up information is provided on this After Visit Summary.  MyChart is used to connect with patients for Virtual Visits (Telemedicine).  Patients are able to view lab/test results, encounter notes, upcoming appointments, etc.  Non-urgent messages can be sent to your provider as well.   To learn more about what you can do with MyChart, go to [email or web address].    Your next appointment:   4 month(s)  The format for your next appointment:   In Person  Provider:   Nicholes Rough, PA-C, Ambrose Pancoast, NP, Ermalinda Barrios, PA-C, Christen Bame, NP, or Richardson Dopp, PA-C          Important Information About Sugar

## 2022-06-05 ENCOUNTER — Other Ambulatory Visit: Payer: Self-pay

## 2022-06-05 LAB — BASIC METABOLIC PANEL
BUN/Creatinine Ratio: 15 (ref 9–23)
BUN: 8 mg/dL (ref 6–24)
CO2: 27 mmol/L (ref 20–29)
Calcium: 8.9 mg/dL (ref 8.7–10.2)
Chloride: 107 mmol/L — ABNORMAL HIGH (ref 96–106)
Creatinine, Ser: 0.55 mg/dL — ABNORMAL LOW (ref 0.57–1.00)
Glucose: 106 mg/dL — ABNORMAL HIGH (ref 70–99)
Potassium: 3.7 mmol/L (ref 3.5–5.2)
Sodium: 145 mmol/L — ABNORMAL HIGH (ref 134–144)
eGFR: 106 mL/min/{1.73_m2} (ref >59)

## 2022-06-09 ENCOUNTER — Ambulatory Visit: Payer: Medicaid Other | Attending: Family Medicine | Admitting: Pharmacist

## 2022-06-09 DIAGNOSIS — E1165 Type 2 diabetes mellitus with hyperglycemia: Secondary | ICD-10-CM

## 2022-06-09 DIAGNOSIS — Z794 Long term (current) use of insulin: Secondary | ICD-10-CM | POA: Diagnosis not present

## 2022-06-09 NOTE — Progress Notes (Signed)
    S:     No chief complaint on file.  Brooke [name] is a 61 y.o. female who presents for diabetes evaluation, education, and management.  PMH is significant for  DM2, chronic pelvic floor dysfunction, protein calorie malnutrition, and CAD and systolic heart failure/iCM.  Patient was referred and last seen by Dr. Margarita Rana on 05/14/2022.   Today, patient arrives in good spirits and presents without any assistance. She is doing well with her insulin regimen and has no complaints today.   Family/Social History:  Fhx: DM, HTN, pancreatic cancer,  Tobacco: never smoker Alcohol: none reported  Current diabetes medications include: Farxiga 10 mg daily, Humalog mix 75/25, 18u BID 20u daily  Patient reports adherence to taking all medications as prescribed.  Insurance coverage: [place] Medicaid  Patient denies hypoglycemic events.   Patient denies nocturia (nighttime urination).  Patient denies neuropathy (nerve pain). Patient denies visual changes. Patient reports self foot exams.   Patient reported dietary habits: Eats 1-3 meals/day Breakfast: oatmeal  Lunch: sandwich with white bread Dinner: beans, chicken or fish, potatoes, green vegetables  Snacks: none Drinks: mostly water and hot tea (unsweetened)  Patient-reported exercise habits: walks at least 30 minutes daily    O:   ROS  Physical Exam Has CGM, Dexcom G7 in place.  Meter with 7 day summary:  67% in range (70-180) 8% > 250 2% 54-70 No levels <54   30 day:  65% in range 22% 181 - 250 11% > 250 , No hypo readings  Lab Results  Component Value Date   HGBA1C 11.9 (A) 04/07/2022   There were no vitals filed for this visit.  Lipid Panel     Component Value Date/Time   CHOL 147 02/20/2022 1548   CHOL 192 12/13/2015 0856   TRIG 134 02/20/2022 1548   HDL 42 02/20/2022 1548   HDL 41 12/13/2015 0856   CHOLHDL 3.5 02/20/2022 1548   VLDL 27 02/20/2022 1548   LDLCALC 78 02/20/2022 1548   LDLCALC 118 (H)  12/13/2015 0856    Patient is participating in a Managed Medicaid Plan:  Yes   A/P: Diabetes longstanding currently uncontrolled based on A1c, however, home data reveals improvement. Patient is able to verbalize appropriate hypoglycemia management plan. Medication adherence appears optimal.  -Continue Humalog 75/25 at 18u BID.  -Continue Farxiga 10 mg daily.  -Extensively discussed pathophysiology of diabetes, recommended lifestyle interventions, dietary effects on blood sugar control.  -Counseled on s/sx of and management of hypoglycemia.   -A1c anticipated 06/2022  Written patient instructions provided. Patient verbalized understanding of treatment plan.  Total time in face to face counseling 30 minutes.    Follow-up:  Pharmacist in ~1 month.  Benard Halsted, PharmD, [address] [place], [place] [phone]

## 2022-06-17 ENCOUNTER — Other Ambulatory Visit: Payer: Self-pay

## 2022-06-18 ENCOUNTER — Other Ambulatory Visit: Payer: Self-pay

## 2022-06-24 ENCOUNTER — Other Ambulatory Visit: Payer: Self-pay

## 2022-06-29 ENCOUNTER — Telehealth: Payer: Self-pay

## 2022-06-29 ENCOUNTER — Other Ambulatory Visit: Payer: Self-pay

## 2022-06-29 NOTE — Telephone Encounter (Signed)
..     Pre-operative Risk Assessment    Patient Name: Brooke [name]  DOB: [birth year] MRN: [id number]     Request for Surgical Clearance    Procedure:  Dental Extraction - Amount of Teeth to be Pulled:  20  Date of Surgery:  Clearance TBD                                 Surgeon:  Diona Browner DMD Surgeon's Group or Practice Name:  [facility] [place] Phone number:  [phone] Fax number:  [phone]   Type of Clearance Requested:   - Medical  - Pharmacy:  Hold Aspirin and Clopidogrel (Plavix)     Type of Anesthesia:  General    Additional requests/questions:    Gwenlyn Found   06/29/2022, 1:04 PM

## 2022-06-29 NOTE — Telephone Encounter (Signed)
     Primary Cardiologist: Candee Furbish, MD  Chart reviewed as part of pre-operative protocol coverage. Given past medical history and time since last visit, based on ACC/AHA guidelines, Brooke [name] would be at acceptable risk for the planned procedure without further cardiovascular testing.   Her Plavix may be held for 5 days prior to her procedure.  Her aspirin should be continued throughout her procedure.  Please resume Plavix as soon as hemostasis is achieved.  I will route this recommendation to the requesting party via Epic fax function and remove from pre-op pool.  Please call with questions.  [name] NP-C     06/29/2022, 1:17 PM [place] [address] Office [phone] Fax [phone]

## 2022-06-30 ENCOUNTER — Other Ambulatory Visit: Payer: Self-pay

## 2022-07-07 DIAGNOSIS — M5416 Radiculopathy, lumbar region: Secondary | ICD-10-CM | POA: Diagnosis not present

## 2022-07-09 ENCOUNTER — Ambulatory Visit
Admission: RE | Admit: 2022-07-09 | Discharge: 2022-07-09 | Disposition: A | Discharge status: Home / Self Care | Payer: Medicaid Other | Source: Ambulatory Visit | Attending: Family Medicine | Admitting: Family Medicine

## 2022-07-09 DIAGNOSIS — Z1231 Encounter for screening mammogram for malignant neoplasm of breast: Secondary | ICD-10-CM | POA: Diagnosis not present

## 2022-07-13 ENCOUNTER — Other Ambulatory Visit: Payer: Self-pay

## 2022-07-14 ENCOUNTER — Other Ambulatory Visit: Payer: Self-pay

## 2022-07-15 ENCOUNTER — Other Ambulatory Visit: Payer: Self-pay

## 2022-07-16 ENCOUNTER — Ambulatory Visit: Payer: Medicaid Other | Attending: Internal Medicine | Admitting: Pharmacist

## 2022-07-16 ENCOUNTER — Encounter: Payer: Self-pay | Admitting: Pharmacist

## 2022-07-16 DIAGNOSIS — Z794 Long term (current) use of insulin: Secondary | ICD-10-CM | POA: Diagnosis not present

## 2022-07-16 DIAGNOSIS — E1165 Type 2 diabetes mellitus with hyperglycemia: Secondary | ICD-10-CM

## 2022-07-16 LAB — POCT GLYCOSYLATED HEMOGLOBIN (HGB A1C): HbA1c, POC (controlled diabetic range): 8 % — AB (ref 0.0–7.0)

## 2022-07-16 NOTE — Progress Notes (Signed)
    S:     No chief complaint on file.  Brooke [name] is a 61 y.o. female who presents for diabetes evaluation, education, and management.  PMH is significant for  DM2, chronic pelvic floor dysfunction, protein calorie malnutrition, and CAD and systolic heart failure/iCM.  Patient was referred and last seen by Dr. Margarita Rana on 05/14/2022. I saw her on 06/09/2022. We continued her on her same medication regimen given her improved home glycemic control.   Today, patient arrives in good spirits and presents without any assistance. She is doing well with her insulin regimen and has no complaints today.   Family/Social History:  Fhx: DM, HTN, pancreatic cancer,  Tobacco: never smoker Alcohol: none reported  Current diabetes medications include: Farxiga 10 mg daily, Humalog mix 75/25, 18u BID  Patient reports adherence to taking all medications as prescribed.  Insurance coverage: [place] Medicaid  Patient reports 2 isolated hypoglycemic events since last visit. Treated successfully.   Patient denies nocturia (nighttime urination).  Patient denies neuropathy (nerve pain). Patient denies visual changes. Patient reports self foot exams.   Patient reported dietary habits: Eats 1-3 meals/day Breakfast: oatmeal  Lunch: sandwich with white bread Dinner: beans, chicken or fish, potatoes, green vegetables  Snacks: none Drinks: mostly water and hot tea (unsweetened)  Patient-reported exercise habits: walks at least 30 minutes daily    O:  Has G7 in place. No data with her today.  She tells me she had a steroid injection last week. She gets these every so often and her CBG will go up to the 300s. She increased insulin to 24u BID for about a week after and then will decrease back to 18u BID once hyperglycemia resolves.   Lab Results  Component Value Date   HGBA1C 8.0 (A) 07/16/2022   There were no vitals filed for this visit.  Lipid Panel     Component Value Date/Time   CHOL 147 02/20/2022  1548   CHOL 192 12/13/2015 0856   TRIG 134 02/20/2022 1548   HDL 42 02/20/2022 1548   HDL 41 12/13/2015 0856   CHOLHDL 3.5 02/20/2022 1548   VLDL 27 02/20/2022 1548   LDLCALC 78 02/20/2022 1548   LDLCALC 118 (H) 12/13/2015 0856    Patient is participating in a Managed Medicaid Plan:  Yes   A/P: Diabetes longstanding currently above goal, however, her A1c has improved. She is only 1% away from goal. Commended her for this! Patient is able to verbalize appropriate hypoglycemia management plan. Medication adherence appears optimal.  -Continue Humalog 75/25 at 18u BID.  -Continue Farxiga 10 mg daily.  -Extensively discussed pathophysiology of diabetes, recommended lifestyle interventions, dietary effects on blood sugar control.  -Counseled on s/sx of and management of hypoglycemia.   -A1c anticipated 09/2022  Written patient instructions provided. Patient verbalized understanding of treatment plan.  Total time in face to face counseling 30 minutes.    Follow-up:  Pharmacist prn.  PCP next month.   Benard Halsted, PharmD, [place], [place] [phone]

## 2022-07-20 ENCOUNTER — Other Ambulatory Visit: Payer: Self-pay

## 2022-07-20 ENCOUNTER — Other Ambulatory Visit: Payer: Self-pay | Admitting: Family Medicine

## 2022-07-21 ENCOUNTER — Other Ambulatory Visit: Payer: Self-pay

## 2022-07-21 MED ORDER — ATORVASTATIN CALCIUM 80 MG PO TABS
80.0000 mg | ORAL_TABLET | Freq: Every day | ORAL | 3 refills | Status: DC
  Filled 2022-07-21: qty 30, 30d supply, fill #0
  Filled 2022-08-18: qty 30, 30d supply, fill #1
  Filled 2022-09-16: qty 30, 30d supply, fill #2
  Filled 2022-10-18: qty 30, 30d supply, fill #3

## 2022-07-21 MED ORDER — DAPAGLIFLOZIN PROPANEDIOL 10 MG PO TABS
10.0000 mg | ORAL_TABLET | Freq: Every day | ORAL | 3 refills | Status: DC
  Filled 2022-07-21: qty 30, 30d supply, fill #0
  Filled 2022-08-18: qty 30, 30d supply, fill #1
  Filled 2022-09-16: qty 30, 30d supply, fill #2
  Filled 2022-10-18: qty 30, 30d supply, fill #3

## 2022-07-22 ENCOUNTER — Other Ambulatory Visit: Payer: Self-pay

## 2022-07-27 ENCOUNTER — Encounter (HOSPITAL_COMMUNITY): Payer: Self-pay | Admitting: Oral Surgery

## 2022-07-27 ENCOUNTER — Other Ambulatory Visit: Payer: Self-pay

## 2022-07-27 NOTE — Progress Notes (Signed)
Anesthesia Chart Review: Same day workup  Follows with cardiology for hx of HFrEF, multivessel CAD. Admitted 3/23 with acute CHF and cardiogenic shock. She was started on empiric milrinone and diuresed with IV lasix. Echo with LVEF 20-25%, GIIDD, RV moderately reduced, trivial MR. R/LHC: severe multivessel CAD with severe LV dysfunction, low filling pressures and low output. Milrinone increased and AHF consulted. Not CABG candidate with poor targets, malnutrition and LV systolic dysfunction. Cardiac MRI showed LVEF 24% with < 50% wall thickness LGE in all areas except apex. Coronary anatomy not favorable for PCI. Milrinone weaned off and GDMT titrated. Insulin started with A1C 11.5. Discharged home, weight 100 lbs. Repeat Echo on 05/01/2022 EF 55-60%, grade I DD, RV okay. Last seen by Dr. Marlou Porch 06/04/22, noted to be euvolemic, NYHA II. Recommended continue GDMT with entresto, spironolactone, farxiga, metoprolol. Also maintained on ASA and Plavix for ASA. 4 month followup recommended. Cardiac clearance per telephone encounter by Coletta Memos, NP on 06/29/22, "Chart reviewed as part of pre-operative protocol coverage. Given past medical history and time since last visit, based on ACC/AHA guidelines, Brooke [name] would be at acceptable risk for the planned procedure without further cardiovascular testing. Her Plavix may be held for 5 days prior to her procedure.  Her aspirin should be continued throughout her procedure.  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  Pt will need DOS labs and eval.  EKG 10/31/21: Normal sinus rhythm. Rate 85. Nonspecific T wave abnormality  TTE 05/01/22:  1. EF significantly improved since TTE done 10/09/21. Left ventricular  ejection fraction, by estimation, is 55 to 60%. The left ventricle has  normal function. The left ventricle has no regional wall motion  abnormalities. Left ventricular diastolic  parameters are  consistent with Grade I diastolic dysfunction (impaired  relaxation).   2. Right ventricular systolic function is normal. The right ventricular  size is normal.   3. The mitral valve is abnormal. No evidence of mitral valve  regurgitation. No evidence of mitral stenosis. Moderate mitral annular  calcification.   4. The aortic valve was not well visualized. Aortic valve regurgitation  is not visualized. No aortic stenosis is present.   5. The inferior vena cava is normal in size with greater than 50%  respiratory variability, suggesting right atrial pressure of 3 mmHg.  R/L cath 10/09/2021:   Prox RCA lesion is 100% stenosed.   Ost LM lesion is 60% stenosed.   Mid LAD lesion is 80% stenosed.   2nd Diag lesion is 85% stenosed.   1st Diag lesion is 99% stenosed.   Prox LAD lesion is 80% stenosed.   Mid Cx lesion is 70% stenosed.   The left ventricular ejection fraction is 25-35% by visual estimate.   1.  Severe multivessel disease. 2.  Severe LV dysfunction 3.  Fick cardiac output of 5.2 L/min and Fick cardiac index of 3.4 L a minute per meter squared with a PA saturation of 68%. 4.  Thermodilution outputs done in triplicate demonstrated a cardiac output of 3 L/min and an index of 2 L/min/m; milrinone was increased to 2.5 mcg per kilogram per minute. 5.  Cardiac hemodynamics as follows:   A.  Right atrial pressure 9/7 with a mean of 5 mmHg B.  RV 38/3 with an end-diastolic pressure of 18 mmHg C.  Wedge pressure of 12/11 with a mean of 10 mmHg D.  Pulmonary artery pressure of 23/12 with a mean pressure of  17 mmHg; PAPI =9/5=1.8 E.  LVEDP of 59mHg.   The results were reviewed with Dr. BHaroldine Laws  The patient be transferred to the to heart unit for medical optimization.  No mechanical circulatory support was pursued.    JWynonia MustyMBloomington Normal Healthcare LLCShort Stay Center/Anesthesiology Phone [phone]/18/2023 11:22 AM

## 2022-07-27 NOTE — Progress Notes (Signed)
PCP - Charlott Rakes, MD Cardiologist - Jerline Pain, MD  EKG - 10/31/21 ECHO - 05/01/22 Cardiac Cath - 10/09/21  Blood Thinner Instructions: Last dose of Plavix 07/19/22 Aspirin Instructions: Continue  ERAS Protcol - NPO  Anesthesia review: Y  Patient verbally denies any shortness of breath, fever, cough and chest pain during phone call   -------------  SDW INSTRUCTIONS given:  Your procedure is scheduled on 07/28/22.  Report to [facility] Main Entrance "A" at 1130 A.M., and check in at the Admitting office.  Call this number if you have problems the morning of surgery:  [phone]   Remember:  Do not eat or drink after midnight the night before your surgery    Take these medicines the morning of surgery with A SIP OF WATER  aspirin  metoprolol succinate (TOPROL-XL)   acetaminophen (TYLENOL)-if needed  **Only day 70% of HUMALOG MIX 75/25 the night before surgery**   .** PLEASE check your blood sugar the morning of your surgery when you wake up and every 2 hours until you get to the Short Stay unit.  If your blood sugar is less than 70 mg/dL, you will need to treat for low blood sugar: Do not take insulin. Treat a low blood sugar (less than 70 mg/dL) with  cup of clear juice (cranberry or apple), 4 glucose tablets, OR glucose gel. Recheck blood sugar in 15 minutes after treatment (to make sure it is greater than 70 mg/dL). If your blood sugar is not greater than 70 mg/dL on recheck, call [phone] for further instructions.   As of today, STOP taking any Aspirin (unless otherwise instructed by your surgeon) Aleve, Naproxen, Ibuprofen, Motrin, Advil, Goody's, BC's, all herbal medications, fish oil, and all vitamins.                      Do not wear jewelry, make up, or nail polish            Do not wear lotions, powders, perfumes/colognes, or deodorant.            Do not shave 48 hours prior to surgery.  Men may shave face and neck.            Do not bring  valuables to the hospital.            [facility] is not responsible for any belongings or valuables.  Do NOT Smoke (Tobacco/Vaping) 24 hours prior to your procedure If you use a CPAP at night, you may bring all equipment for your overnight stay.   Contacts, glasses, dentures or bridgework may not be worn into surgery.      For patients admitted to the hospital, discharge time will be determined by your treatment team.   Patients discharged the day of surgery will not be allowed to drive home, and someone needs to stay with them for 24 hours.    Special instructions:   [place]- Preparing For Surgery  Before surgery, you can play an important role. Because skin is not sterile, your skin needs to be as free of germs as possible. You can reduce the number of germs on your skin by washing with CHG (chlorahexidine gluconate) Soap before surgery.  CHG is an antiseptic cleaner which kills germs and bonds with the skin to continue killing germs even after washing.    Oral Hygiene is also important to reduce your risk of infection.  Remember - BRUSH YOUR TEETH THE MORNING OF SURGERY  WITH YOUR REGULAR TOOTHPASTE  Please do not use if you have an allergy to CHG or antibacterial soaps. If your skin becomes reddened/irritated stop using the CHG.  Do not shave (including legs and underarms) for at least 48 hours prior to first CHG shower. It is OK to shave your face.  Please follow these instructions carefully.   Shower the NIGHT BEFORE SURGERY and the MORNING OF SURGERY with DIAL Soap.   Pat yourself dry with a CLEAN TOWEL.  Wear CLEAN PAJAMAS to bed the night before surgery  Place CLEAN SHEETS on your bed the night of your first shower and DO NOT SLEEP WITH PETS.   Day of Surgery: Please shower morning of surgery  Wear Clean/Comfortable clothing the morning of surgery Do not apply any deodorants/lotions.   Remember to brush your teeth WITH YOUR REGULAR TOOTHPASTE.   Questions were  answered. Patient verbalized understanding of instructions.

## 2022-07-27 NOTE — H&P (Signed)
   Patient: Brooke [name]  PID: [id number]  DOB: 12/21/1960  SEX: Female   Self Referral  CC: Wants all teeth out.  Past Medical History:  Heart Attack  10/2021, Diabetes last HbA1c 10   Medications: Atorvastatin, Clopidogrel, Dexcom, Entresto, Metropolol, Humalog, Farxiga, Spironolactone    Allergies:     Gabapentin, Darvocet, Aspirin    Surgeries:   Appendectomy, Hernia, ovarian cyst removal     Social History       Smoking: n           Alcohol:n Drug use:n                             Exam: BMI  18.5 Multiple residual roots remain, some submucosal.No purulence, edema, fluctuance, trismus. Oral cancer screening negative. Pharynx clear. No lymphadenopathy.  Panorex:Multiple dental roots # 3-12, 21-30.   Assessment: ASA 3. Non-restorable dental roots # 3-12, 21-30              Plan: 1. MD Clearance  2. Cardiac clearance  3. Extraction Teeth  # 3-12, 21-30   Hospital Day surgery.                 Rx: n               Risks and complications explained. Questions answered.   Gae Bon, DMD

## 2022-07-27 NOTE — Anesthesia Preprocedure Evaluation (Signed)
Anesthesia Evaluation  Patient identified by MRN, date of birth, ID band Patient awake    Reviewed: Allergy & Precautions, NPO status , Patient's Chart, lab work & pertinent test results, reviewed documented beta blocker date and time   Airway Mallampati: II  TM Distance: >3 FB Neck ROM: Full    Dental  (+) Poor Dentition, Dental Advisory Given   Pulmonary neg pulmonary ROS   Pulmonary exam normal breath sounds clear to auscultation       Cardiovascular hypertension, Pt. on medications and Pt. on home beta blockers + CAD and +CHF  Normal cardiovascular exam Rhythm:Regular Rate:Normal  EKG 10/31/21 NSR  Echo 05/01/22 1. EF significantly improved since TTE done 10/09/21. Left ventricular  ejection fraction, by estimation, is 55 to 60%. The left ventricle has  normal function. The left ventricle has no regional wall motion  abnormalities. Left ventricular diastolic  parameters are consistent with Grade I diastolic dysfunction (impaired  relaxation).   2. Right ventricular systolic function is normal. The right ventricular  size is normal.   3. The mitral valve is abnormal. No evidence of mitral valve  regurgitation. No evidence of mitral stenosis. Moderate mitral annular  calcification.   4. The aortic valve was not well visualized. Aortic valve regurgitation  is not visualized. No aortic stenosis is present.   5. The inferior vena cava is normal in size with greater than 50%  respiratory variability, suggesting right atrial pressure of 3 mmHg.   Cardiac Cath 10/09/21   Prox RCA lesion is 100% stenosed.   Ost LM lesion is 60% stenosed.   Mid LAD lesion is 80% stenosed.   2nd Diag lesion is 85% stenosed.   1st Diag lesion is 99% stenosed.   Prox LAD lesion is 80% stenosed.   Mid Cx lesion is 70% stenosed.   The left ventricular ejection fraction is 25-35% by visual estimate.   1.  Severe multivessel disease. 2.  Severe  LV dysfunction 3.  Fick cardiac output of 5.2 L/min and Fick cardiac index of 3.4 L a minute per meter squared with a PA saturation of 68%. 4.  Thermodilution outputs done in triplicate demonstrated a cardiac output of 3 L/min and an index of 2 L/min/m; milrinone was increased to 2.5 mcg per kilogram per minute. 5.  Cardiac hemodynamics as follows:   A.  Right atrial pressure 9/7 with a mean of 5 mmHg B.  RV 38/3 with an end-diastolic pressure of 18 mmHg C.  Wedge pressure of 12/11 with a mean of 10 mmHg D.  Pulmonary artery pressure of 23/12 with a mean pressure of 17 mmHg; PAPI =9/5=1.8 E.  LVEDP of 49mHg.     Neuro/Psych  PSYCHIATRIC DISORDERS Anxiety Depression    negative neurological ROS     GI/Hepatic Neg liver ROS,,,Non restorable teeth   Endo/Other  diabetes, Poorly Controlled, Type 2, Oral Hypoglycemic Agents  Hyperlipidemia  Renal/GU negative Renal ROS  negative genitourinary   Musculoskeletal negative musculoskeletal ROS (+)    Abdominal   Peds  Hematology   Anesthesia Other Findings   Reproductive/Obstetrics                             Anesthesia Physical Anesthesia Plan  ASA: 3  Anesthesia Plan: General   Post-op Pain Management: Minimal or no pain anticipated   Induction:   PONV Risk Score and Plan: 4 or greater and Treatment may vary due to age or  medical condition and Ondansetron  Airway Management Planned: Nasal ETT  Additional Equipment: None  Intra-op Plan:   Post-operative Plan: Extubation in OR  Informed Consent: I have reviewed the patients History and Physical, chart, labs and discussed the procedure including the risks, benefits and alternatives for the proposed anesthesia with the patient or authorized representative who has indicated his/her understanding and acceptance.     Dental advisory given  Plan Discussed with: Anesthesiologist and CRNA  Anesthesia Plan Comments: (PAT note by Karoline Caldwell,  PA-C: Follows with cardiology for hx of HFrEF, multivessel CAD. Admitted 3/23 with acute CHF and cardiogenic shock.She was started on empiric milrinone and diuresed with IV lasix. EchowithLVEF 20-25%, GIIDD, RV moderately reduced, trivial MR. R/LHC:severe multivessel CAD with severe LV dysfunction, low filling pressures and low output. Milrinone increased and AHF consulted. Not CABG candidate with poor targets, malnutrition and LV systolic dysfunction. Cardiac MRI showed LVEF 24%with < 50% wall thickness LGE in all areas except apex.Coronary anatomy not favorable for PCI. Milrinone weaned off and GDMT titrated. Insulin started with A1C 11.5. Discharged home, weight 100 lbs. Repeat [place] 9/22/2023EF 55-60%, grade I DD, RV okay. Last seen by Dr. Marlou Porch 06/04/22, noted to be euvolemic, NYHA II. Recommended continue GDMT with entresto, spironolactone, farxiga, metoprolol. Also maintained on ASA and Plavix for ASA. 4 month followup recommended. Cardiac clearance per telephone encounter by Coletta Memos, NP on 06/29/22, "Chart reviewed as part of pre-operative protocol coverage. Given past medical history and time since last visit, based on ACC/AHA guidelines,Brooke Williamswould be at acceptable risk for the planned procedure without further cardiovascular testing. Her Plavix may be held for 5 days prior to her procedure. Her aspirin should be continued throughout her procedure. 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  Pt will need DOS labs and eval.  EKG 10/31/21: Normal sinus rhythm. Rate 85. Nonspecific T wave abnormality  TTE 05/01/22: 1. EF significantly improved since TTE done 10/09/21. Left ventricular  ejection fraction, by estimation, is 55 to 60%. The left ventricle has  normal function. The left ventricle has no regional wall motion  abnormalities. Left ventricular diastolic  parameters are consistent with Grade I diastolic  dysfunction (impaired  relaxation).  2. Right ventricular systolic function is normal. The right ventricular  size is normal.  3. The mitral valve is abnormal. No evidence of mitral valve  regurgitation. No evidence of mitral stenosis. Moderate mitral annular  calcification.  4. The aortic valve was not well visualized. Aortic valve regurgitation  is not visualized. No aortic stenosis is present.  5. The inferior vena cava is normal in size with greater than 50%  respiratory variability, suggesting right atrial pressure of 3 mmHg.  R/L cath 10/09/2021:  Prox RCA lesion is 100% stenosed.  Ost LM lesion is 60% stenosed.  Mid LAD lesion is 80% stenosed.  2nd Diag lesion is 85% stenosed.  1st Diag lesion is 99% stenosed.  Prox LAD lesion is 80% stenosed.  Mid Cx lesion is 70% stenosed.  The left ventricular ejection fraction is 25-35% by visual estimate.  1. Severe multivessel disease. 2. Severe LV dysfunction 3. Fick cardiac output of 5.2 L/min and Fick cardiac index of 3.4 L a minute per meter squared with a PA saturation of 68%. 4. Thermodilution outputs done in triplicate demonstrated a cardiac output of 3 L/min and an index of 2 L/min/m; milrinone was increased to 2.5 mcg per kilogram per minute. 5. Cardiac hemodynamics  as follows:  A. Right atrial pressure 9/7 with a mean of 5 mmHg B. RV 38/3 with an end-diastolic pressure of 18 mmHg C. Wedge pressure of 12/11 with a mean of 10 mmHg D. Pulmonary artery pressure of 23/12 with a mean pressure of 17 mmHg; PAPI =9/5=1.8 E. LVEDP of 44mHg.  The results were reviewed with Dr. BHaroldine Laws The patient be transferred to the to heart unit for medical optimization. No mechanical circulatory support was pursued.    )        Anesthesia Quick Evaluation

## 2022-07-28 ENCOUNTER — Other Ambulatory Visit: Payer: Self-pay | Admitting: Family Medicine

## 2022-07-28 ENCOUNTER — Other Ambulatory Visit: Payer: Self-pay

## 2022-07-28 ENCOUNTER — Ambulatory Visit (HOSPITAL_COMMUNITY): Payer: Medicaid Other | Admitting: Physician Assistant

## 2022-07-28 ENCOUNTER — Other Ambulatory Visit (HOSPITAL_COMMUNITY): Payer: Self-pay

## 2022-07-28 ENCOUNTER — Encounter (HOSPITAL_COMMUNITY)
Admission: RE | Disposition: A | Discharge status: Home / Self Care | Payer: Self-pay | Source: Ambulatory Visit | Attending: Oral Surgery

## 2022-07-28 ENCOUNTER — Ambulatory Visit (HOSPITAL_BASED_OUTPATIENT_CLINIC_OR_DEPARTMENT_OTHER): Payer: Medicaid Other | Admitting: Physician Assistant

## 2022-07-28 ENCOUNTER — Ambulatory Visit (HOSPITAL_COMMUNITY)
Admission: RE | Admit: 2022-07-28 | Discharge: 2022-07-28 | Disposition: A | Discharge status: Home / Self Care | Payer: Medicaid Other | Source: Ambulatory Visit | Attending: Oral Surgery | Admitting: Oral Surgery

## 2022-07-28 ENCOUNTER — Encounter (HOSPITAL_COMMUNITY): Payer: Self-pay | Admitting: Oral Surgery

## 2022-07-28 DIAGNOSIS — I2510 Atherosclerotic heart disease of native coronary artery without angina pectoris: Secondary | ICD-10-CM | POA: Diagnosis not present

## 2022-07-28 DIAGNOSIS — I110 Hypertensive heart disease with heart failure: Secondary | ICD-10-CM

## 2022-07-28 DIAGNOSIS — E785 Hyperlipidemia, unspecified: Secondary | ICD-10-CM | POA: Diagnosis not present

## 2022-07-28 DIAGNOSIS — Z7984 Long term (current) use of oral hypoglycemic drugs: Secondary | ICD-10-CM | POA: Diagnosis not present

## 2022-07-28 DIAGNOSIS — K029 Dental caries, unspecified: Secondary | ICD-10-CM | POA: Diagnosis not present

## 2022-07-28 DIAGNOSIS — E1165 Type 2 diabetes mellitus with hyperglycemia: Secondary | ICD-10-CM | POA: Diagnosis not present

## 2022-07-28 DIAGNOSIS — I509 Heart failure, unspecified: Secondary | ICD-10-CM

## 2022-07-28 DIAGNOSIS — E119 Type 2 diabetes mellitus without complications: Secondary | ICD-10-CM | POA: Insufficient documentation

## 2022-07-28 DIAGNOSIS — Z794 Long term (current) use of insulin: Secondary | ICD-10-CM | POA: Insufficient documentation

## 2022-07-28 DIAGNOSIS — K0850 Unsatisfactory restoration of tooth, unspecified: Secondary | ICD-10-CM | POA: Diagnosis not present

## 2022-07-28 HISTORY — DX: Heart failure, unspecified: I50.9

## 2022-07-28 HISTORY — PX: TOOTH EXTRACTION: SHX859

## 2022-07-28 LAB — CBC
HCT: 51.9 % — ABNORMAL HIGH (ref 36.0–46.0)
Hemoglobin: 16.2 g/dL — ABNORMAL HIGH (ref 12.0–15.0)
MCH: 26 pg (ref 26.0–34.0)
MCHC: 31.2 g/dL (ref 30.0–36.0)
MCV: 83.3 fL (ref 80.0–100.0)
Platelets: 337 10*3/uL (ref 150–400)
RBC: 6.23 MIL/uL — ABNORMAL HIGH (ref 3.87–5.11)
RDW: 14.4 % (ref 11.5–15.5)
WBC: 11.5 10*3/uL — ABNORMAL HIGH (ref 4.0–10.5)
nRBC: 0 % (ref 0.0–0.2)

## 2022-07-28 LAB — BASIC METABOLIC PANEL
Anion gap: 9 (ref 5–15)
BUN: 10 mg/dL (ref 6–20)
CO2: 26 mmol/L (ref 22–32)
Calcium: 8.9 mg/dL (ref 8.9–10.3)
Chloride: 104 mmol/L (ref 98–111)
Creatinine, Ser: 0.77 mg/dL (ref 0.44–1.00)
GFR, Estimated: >60 mL/min (ref >60)
Glucose, Bld: 151 mg/dL — ABNORMAL HIGH (ref 70–99)
Potassium: 4.5 mmol/L (ref 3.5–5.1)
Sodium: 139 mmol/L (ref 135–145)

## 2022-07-28 LAB — GLUCOSE, CAPILLARY
Glucose-Capillary: 44 mg/dL — CL (ref 70–99)
Glucose-Capillary: 157 mg/dL — ABNORMAL HIGH (ref 70–99)
Glucose-Capillary: 136 mg/dL — ABNORMAL HIGH (ref 70–99)
Glucose-Capillary: 94 mg/dL (ref 70–99)
Glucose-Capillary: 113 mg/dL — ABNORMAL HIGH (ref 70–99)

## 2022-07-28 SURGERY — DENTAL RESTORATION/EXTRACTIONS
Anesthesia: General | Site: Mouth

## 2022-07-28 MED ORDER — OXYMETAZOLINE HCL 0.05 % NA SOLN
NASAL | Status: AC
  Filled 2022-07-28: qty 30

## 2022-07-28 MED ORDER — ONDANSETRON HCL 4 MG/2ML IJ SOLN
INTRAMUSCULAR | Status: AC
  Filled 2022-07-28: qty 2

## 2022-07-28 MED ORDER — DEXAMETHASONE SODIUM PHOSPHATE 10 MG/ML IJ SOLN
INTRAMUSCULAR | Status: AC
  Filled 2022-07-28: qty 1

## 2022-07-28 MED ORDER — LIDOCAINE 2% (20 MG/ML) 5 ML SYRINGE
INTRAMUSCULAR | Status: DC | PRN
  Administered 2022-07-28: 40 mg via INTRAVENOUS

## 2022-07-28 MED ORDER — ONDANSETRON HCL 4 MG/2ML IJ SOLN
INTRAMUSCULAR | Status: DC | PRN
  Administered 2022-07-28: 4 mg via INTRAVENOUS

## 2022-07-28 MED ORDER — LIDOCAINE-EPINEPHRINE 2 %-1:100000 IJ SOLN
INTRAMUSCULAR | Status: DC | PRN
  Administered 2022-07-28: 20 mL

## 2022-07-28 MED ORDER — CHLORHEXIDINE GLUCONATE 0.12 % MT SOLN
15.0000 mL | Freq: Once | OROMUCOSAL | Status: AC
  Administered 2022-07-28: 15 mL via OROMUCOSAL
  Filled 2022-07-28: qty 15

## 2022-07-28 MED ORDER — CHLORHEXIDINE GLUCONATE CLOTH 2 % EX PADS: 6.0000 | MEDICATED_PAD | Freq: Once | CUTANEOUS | Status: DC

## 2022-07-28 MED ORDER — DEXTROSE 50 % IV SOLN: 25.0000 g | INTRAVENOUS | Status: AC

## 2022-07-28 MED ORDER — LACTATED RINGERS IV SOLN: INTRAVENOUS | Status: DC

## 2022-07-28 MED ORDER — ROCURONIUM BROMIDE 10 MG/ML (PF) SYRINGE
PREFILLED_SYRINGE | INTRAVENOUS | Status: DC | PRN
  Administered 2022-07-28: 50 mg via INTRAVENOUS

## 2022-07-28 MED ORDER — SUGAMMADEX SODIUM 200 MG/2ML IV SOLN
INTRAVENOUS | Status: DC | PRN
  Administered 2022-07-28: 214 mg via INTRAVENOUS

## 2022-07-28 MED ORDER — FENTANYL CITRATE (PF) 250 MCG/5ML IJ SOLN
INTRAMUSCULAR | Status: DC | PRN
  Administered 2022-07-28: 25 ug via INTRAVENOUS
  Administered 2022-07-28: 50 ug via INTRAVENOUS
  Administered 2022-07-28: 25 ug via INTRAVENOUS

## 2022-07-28 MED ORDER — OXYMETAZOLINE HCL 0.05 % NA SOLN
NASAL | Status: DC | PRN
  Administered 2022-07-28: 2 via NASAL

## 2022-07-28 MED ORDER — LIDOCAINE-EPINEPHRINE 2 %-1:100000 IJ SOLN
INTRAMUSCULAR | Status: AC
  Filled 2022-07-28: qty 1

## 2022-07-28 MED ORDER — PHENYLEPHRINE HCL (PRESSORS) 10 MG/ML IV SOLN
INTRAVENOUS | Status: DC | PRN
  Administered 2022-07-28: 160 ug via INTRAVENOUS
  Administered 2022-07-28 (×5): 80 ug via INTRAVENOUS

## 2022-07-28 MED ORDER — DEXAMETHASONE SODIUM PHOSPHATE 10 MG/ML IJ SOLN
INTRAMUSCULAR | Status: DC | PRN
  Administered 2022-07-28: 5 mg via INTRAVENOUS

## 2022-07-28 MED ORDER — 0.9 % SODIUM CHLORIDE (POUR BTL) OPTIME
TOPICAL | Status: DC | PRN
  Administered 2022-07-28: 100 mL

## 2022-07-28 MED ORDER — DEXTROSE 50 % IV SOLN
INTRAVENOUS | Status: AC
  Administered 2022-07-28: 25 g via INTRAVENOUS
  Filled 2022-07-28: qty 50

## 2022-07-28 MED ORDER — FENTANYL CITRATE (PF) 250 MCG/5ML IJ SOLN
INTRAMUSCULAR | Status: AC
  Filled 2022-07-28: qty 5

## 2022-07-28 MED ORDER — PROPOFOL 10 MG/ML IV BOLUS
INTRAVENOUS | Status: AC
  Filled 2022-07-28: qty 20

## 2022-07-28 MED ORDER — ORAL CARE MOUTH RINSE: 15.0000 mL | Freq: Once | OROMUCOSAL | Status: AC

## 2022-07-28 MED ORDER — SODIUM CHLORIDE 0.9 % IR SOLN
Status: DC | PRN
  Administered 2022-07-28: 1000 mL

## 2022-07-28 MED ORDER — CEFAZOLIN SODIUM-DEXTROSE 2-4 GM/100ML-% IV SOLN
2.0000 g | INTRAVENOUS | Status: AC
  Administered 2022-07-28: 2 g via INTRAVENOUS
  Filled 2022-07-28: qty 100

## 2022-07-28 MED ORDER — AMOXICILLIN 500 MG PO CAPS
500.0000 mg | ORAL_CAPSULE | Freq: Three times a day (TID) | ORAL | 0 refills | Status: DC
  Filled 2022-07-28: qty 21, 7d supply, fill #0

## 2022-07-28 MED ORDER — PHENYLEPHRINE 80 MCG/ML (10ML) SYRINGE FOR IV PUSH (FOR BLOOD PRESSURE SUPPORT)
PREFILLED_SYRINGE | INTRAVENOUS | Status: AC
  Filled 2022-07-28: qty 10

## 2022-07-28 MED ORDER — OXYMETAZOLINE HCL 0.05 % NA SOLN
NASAL | Status: DC | PRN
  Administered 2022-07-28: 2

## 2022-07-28 MED ORDER — LIDOCAINE 2% (20 MG/ML) 5 ML SYRINGE
INTRAMUSCULAR | Status: AC
  Filled 2022-07-28: qty 5

## 2022-07-28 MED ORDER — HYDROCODONE-ACETAMINOPHEN 5-325 MG PO TABS
1.0000 | ORAL_TABLET | Freq: Four times a day (QID) | ORAL | 0 refills | Status: DC | PRN
  Filled 2022-07-28: qty 24, 5d supply, fill #0

## 2022-07-28 MED ORDER — SPIRONOLACTONE 25 MG PO TABS
25.0000 mg | ORAL_TABLET | Freq: Every day | ORAL | 2 refills | Status: DC
  Filled 2022-07-28: qty 30, 30d supply, fill #0
  Filled 2022-08-27: qty 30, 30d supply, fill #1
  Filled 2022-09-27: qty 30, 30d supply, fill #2

## 2022-07-28 MED ORDER — ROCURONIUM BROMIDE 10 MG/ML (PF) SYRINGE
PREFILLED_SYRINGE | INTRAVENOUS | Status: AC
  Filled 2022-07-28: qty 10

## 2022-07-28 MED ORDER — PROPOFOL 10 MG/ML IV BOLUS
INTRAVENOUS | Status: DC | PRN
  Administered 2022-07-28: 160 mg via INTRAVENOUS

## 2022-07-28 SURGICAL SUPPLY — 36 items
BAG COUNTER SPONGE SURGICOUNT (BAG) IMPLANT
BAG SPNG CNTER NS LX DISP (BAG)
BLADE SURG 15 STRL LF DISP TIS (BLADE) ×2 IMPLANT
BLADE SURG 15 STRL SS (BLADE) ×1
BUR CROSS CUT FISSURE 1.6 (BURR) ×2 IMPLANT
BUR EGG ELITE 4.0 (BURR) ×2 IMPLANT
CANISTER SUCT 3000ML PPV (MISCELLANEOUS) ×2 IMPLANT
COVER SURGICAL LIGHT HANDLE (MISCELLANEOUS) ×2 IMPLANT
GAUZE PACKING FOLDED 2  STR (GAUZE/BANDAGES/DRESSINGS) ×2
GAUZE PACKING FOLDED 2 STR (GAUZE/BANDAGES/DRESSINGS) ×2 IMPLANT
GLOVE BIO SURGEON STRL SZ 6.5 (GLOVE) IMPLANT
GLOVE BIO SURGEON STRL SZ7 (GLOVE) IMPLANT
GLOVE BIO SURGEON STRL SZ8 (GLOVE) ×2 IMPLANT
GLOVE BIOGEL PI IND STRL 6.5 (GLOVE) IMPLANT
GLOVE BIOGEL PI IND STRL 7.0 (GLOVE) IMPLANT
GOWN STRL REUS W/ TWL LRG LVL3 (GOWN DISPOSABLE) ×2 IMPLANT
GOWN STRL REUS W/ TWL XL LVL3 (GOWN DISPOSABLE) ×2 IMPLANT
GOWN STRL REUS W/TWL LRG LVL3 (GOWN DISPOSABLE) ×1
GOWN STRL REUS W/TWL XL LVL3 (GOWN DISPOSABLE) ×1
IV NS 1000ML (IV SOLUTION) ×1
IV NS 1000ML BAXH (IV SOLUTION) ×2 IMPLANT
KIT BASIN OR (CUSTOM PROCEDURE TRAY) ×2 IMPLANT
KIT TURNOVER KIT B (KITS) ×2 IMPLANT
NDL HYPO 25GX1X1/2 BEV (NEEDLE) ×4 IMPLANT
NEEDLE HYPO 25GX1X1/2 BEV (NEEDLE) ×2 IMPLANT
NS IRRIG 1000ML POUR BTL (IV SOLUTION) ×2 IMPLANT
PAD ARMBOARD 7.5X6 YLW CONV (MISCELLANEOUS) ×2 IMPLANT
SLEEVE IRRIGATION ELITE 7 (MISCELLANEOUS) ×2 IMPLANT
SPIKE FLUID TRANSFER (MISCELLANEOUS) ×2 IMPLANT
SPONGE SURGIFOAM ABS GEL 12-7 (HEMOSTASIS) IMPLANT
SUT CHROMIC 3 0 PS 2 (SUTURE) ×2 IMPLANT
SYR BULB IRRIG 60ML STRL (SYRINGE) ×2 IMPLANT
SYR CONTROL 10ML LL (SYRINGE) ×2 IMPLANT
TRAY ENT MC OR (CUSTOM PROCEDURE TRAY) ×2 IMPLANT
TUBING IRRIGATION (MISCELLANEOUS) ×2 IMPLANT
YANKAUER SUCT BULB TIP NO VENT (SUCTIONS) ×2 IMPLANT

## 2022-07-28 NOTE — Transfer of Care (Signed)
Immediate Anesthesia Transfer of Care Note  Patient: Brooke [name]  Procedure(s) Performed: EXTRACTION ALL REMAING TEETH THREE, FOUR, FIVE, SIX, SEVEN, EIGHT, NINE, TEN, ELEVEN, TWELVE, TWENTY ONE, TWENTY TWO, TWENTY THREE, TWENTY FOUR, TWENTY FIVE, TWENTY SIX, TWENTY SEVEN, TWENTY EIGHT, TWENTY NINE, THIRTY, AVEOLOPLASTY (Mouth)  Patient Location: PACU  Anesthesia Type:General  Level of Consciousness: awake, alert , and oriented  Airway & Oxygen Therapy: Patient Spontanous Breathing  Post-op Assessment: Report given to RN, Post -op Vital signs reviewed and stable, Patient moving all extremities X 4, and Patient able to stick tongue midline  Post vital signs: Reviewed  Last Vitals:  Vitals Value Taken Time  BP 146/73 07/28/22 1540  Temp 36.1 C 07/28/22 1540  Pulse 86 07/28/22 1542  Resp 16 07/28/22 1542  SpO2 100 % 07/28/22 1542  Vitals shown include unvalidated device data.  Last Pain:  Vitals:   07/28/22 1540  TempSrc:   PainSc: Asleep         Complications: No notable events documented.

## 2022-07-28 NOTE — Op Note (Signed)
07/28/2022  3:32 PM  PATIENT:  Brooke [name]  61 y.o. female  PRE-OPERATIVE DIAGNOSIS:  NON RESTORABLE TEETH THREE, FOUR, FIVE, SIX, SEVEN, EIGHT, NINE, TEN, ELEVEN, TWELVE, TWENTY ONE, TWENTY TWO, TWENTY THREE, TWENTY FOUR, TWENTY FIVE, TWENTY SIX, TWENTY SEVEN, TWENTY EIGHT, TWENTY NINE, THIRTY,   POST-OPERATIVE DIAGNOSIS:  SAME  PROCEDURE:  Procedure(s): EXTRACTION ALL REMAING TEETH THREE, FOUR, FIVE, SIX, SEVEN, EIGHT, NINE, TEN, ELEVEN, TWELVE, TWENTY ONE, TWENTY TWO, TWENTY THREE, TWENTY FOUR, TWENTY FIVE, TWENTY SIX, TWENTY SEVEN, TWENTY EIGHT, TWENTY NINE, THIRTY, AVEOLOPLASTY  SURGEON:  Surgeon(s): Diona Browner, DMD  ANESTHESIA:   local and general  EBL:  minimal  DRAINS: none   SPECIMEN:  No Specimen  COUNTS:  YES  PLAN OF CARE: Discharge to home after PACU  PATIENT DISPOSITION:  PACU - hemodynamically stable.   PROCEDURE DETAILS: Dictation # [id number]  Gae Bon, DMD 07/28/2022 3:32 PM

## 2022-07-28 NOTE — Anesthesia Procedure Notes (Signed)
Procedure Name: Intubation Date/Time: 07/28/2022 2:49 PM  Performed by: Maude Leriche, CRNAPre-anesthesia Checklist: Patient identified, Emergency Drugs available, Suction available and Patient being monitored Patient Re-evaluated:Patient Re-evaluated prior to induction Oxygen Delivery Method: Circle system utilized Preoxygenation: Pre-oxygenation with 100% oxygen Induction Type: IV induction Ventilation: Mask ventilation without difficulty Laryngoscope Size: Miller and 2 Grade View: Grade I Nasal Tubes: Nasal prep performed, Nasal Rae, Right and Magill forceps- large, utilized Tube size: 7.0 mm Number of attempts: 1 Placement Confirmation: ETT inserted through vocal cords under direct vision, positive ETCO2 and breath sounds checked- equal and bilateral Secured at: 26 cm Tube secured with: Tape Dental Injury: Teeth and Oropharynx as per pre-operative assessment

## 2022-07-28 NOTE — H&P (Signed)
H&P documentation  -History and Physical Reviewed  -Patient has been re-examined  -No change in the plan of care  Brooke [name]  

## 2022-07-28 NOTE — Anesthesia Postprocedure Evaluation (Signed)
Anesthesia Post Note  Patient: Brooke [name]  Procedure(s) Performed: EXTRACTION ALL REMAING TEETH THREE, FOUR, FIVE, SIX, SEVEN, EIGHT, NINE, TEN, ELEVEN, TWELVE, TWENTY ONE, TWENTY TWO, TWENTY THREE, TWENTY FOUR, TWENTY FIVE, TWENTY SIX, TWENTY SEVEN, TWENTY EIGHT, TWENTY NINE, THIRTY, AVEOLOPLASTY (Mouth)     Patient location during evaluation: PACU Anesthesia Type: General Level of consciousness: awake and alert Pain management: pain level controlled Vital Signs Assessment: post-procedure vital signs reviewed and stable Respiratory status: spontaneous breathing, nonlabored ventilation and respiratory function stable Cardiovascular status: stable and blood pressure returned to baseline Anesthetic complications: no   No notable events documented.  Last Vitals:  Vitals:   07/28/22 1555 07/28/22 1610  BP: (!) 126/58 127/61  Pulse: 83 85  Resp: 12 17  Temp:  37.1 C  SpO2: 99% 100%    Last Pain:  Vitals:   07/28/22 1610  TempSrc:   PainSc: 0-No pain                 Audry Pili

## 2022-07-28 NOTE — H&P (Signed)
Anesthesia H&P Update: History and Physical Exam reviewed; patient is OK for planned anesthetic and procedure. ? ?

## 2022-07-29 ENCOUNTER — Encounter (HOSPITAL_COMMUNITY): Payer: Self-pay | Admitting: Oral Surgery

## 2022-08-04 NOTE — Op Note (Signed)
NAME: Brooke [name], Brooke [name] MEDICAL RECORD NO: [id number] ACCOUNT NO: [id number] DATE OF BIRTH: 1960-11-25 FACILITY: [facility] LOCATION: [facility] PHYSICIAN: Gae Bon, DDS  Operative Report   DATE OF PROCEDURE: 07/28/2022  PREOPERATIVE DIAGNOSIS:  Nonrestorable teeth secondary to dental caries numbers 3, 4, 5, 6, 7, 8, 9, 10, 11, 12, 21, 22, 23, 24, 25, 26, 27, 28, 29, 30.  POSTOPERATIVE DIAGNOSIS:  Nonrestorable teeth secondary to dental caries numbers 3, 4, 5, 6, 7, 8, 9, 10, 11, 12, 21, 22, 23, 24, 25, 26, 27, 28, 29, 30.  PROCEDURE:  Extraction teeth numbers 3, 4, 5, 6, 7, 8, 9, 10, 11, 12, 21, 22, 23, 24, 25, 26, 27, 28, 29, 30; Alveoloplasty, right and left maxilla and mandible.  SURGEON:  Gae Bon, DDS  ANESTHESIA:  General, nasal intubation, Dr. Royce Macadamia attending.  DESCRIPTION OF PROCEDURE:  The patient was taken to the operating room and placed on table in supine position.  General anesthesia was administered and nasal endotracheal tube was placed and secured.  The eyes were protected and the patient was draped  for surgery.  Timeout was performed.  The posterior pharynx was suctioned.  Throat pack was placed.  2% lidocaine 1:100,000 epinephrine was infiltrated in an inferior alveolar block on the right and left sides and in buccal and palatal infiltration in  the maxilla around the teeth to be removed.  A bite block was placed on the right side of the mouth.  A sweetheart retractor was used to retract the tongue.  A #15 blade to make an incision in the area of tooth #19 carried forward around the roots, so  teeth numbers 21 22, 23, 24, 25 and 26.  The periosteum was reflected.  The teeth were easily removed with the dental rongeurs.  The sockets were curetted.  The periosteum was reflected further to expose the alveolar crest.  Alveoplasty was performed  using the egg bur followed by the bone file.  Then, the area was irrigated and closed with 3-0 chromic.  The left maxilla was  operated.  The 15 blade was used to make an incision in the area of tooth #14 on the alveolar crest carried forward buccal and  palatally around the teeth numbers 12, 11, 10, 9, 8 and 7.  The periosteum was reflected.  The teeth were elevated with a 301 elevator and removed from the mouth with the rongeurs.  The sockets were curetted, debrided and then alveoplasty was performed  using the egg bur followed by the bone file.  Then, the area was irrigated and closed with 3-0 chromic.  The bite block and sweetheart retractor were repositioned to the other side of the mouth.  A 15 blade was used to make an incision around teeth  numbers 27, 28, 29, 30 in the right mandible and around teeth numbers 6, 5, 4, 3 in the maxilla.  The periosteum was reflected.  Roots and root fragments were removed using rongeurs.  The sockets were curetted and debrided and then alveoplasty was  performed in the right maxilla and mandible and the area was irrigated and closed with 3-0 chromic.  The oral cavity was then irrigated and suctioned.  Additional local anesthesia was administered and the patient was left under care of anesthesia for  extubation and transport to recovery room with plans for discharge home through day surgery.  ESTIMATED BLOOD LOSS:  Minimal.  COMPLICATIONS:  None.  SPECIMENS:  None.   PUS D: 07/28/2022 3:36:27 pm T: 07/28/2022  6:45:00 pm  JOB: [id number]/ [id number]

## 2022-08-07 ENCOUNTER — Other Ambulatory Visit: Payer: Self-pay

## 2022-08-18 ENCOUNTER — Encounter: Payer: Self-pay | Admitting: Family Medicine

## 2022-08-18 ENCOUNTER — Ambulatory Visit: Payer: Medicaid Other | Attending: Family Medicine | Admitting: Family Medicine

## 2022-08-18 ENCOUNTER — Other Ambulatory Visit: Payer: Self-pay

## 2022-08-18 VITALS — BP 127/74 | HR 76 | Temp 97.8°F | Ht 64.0 in | Wt 108.6 lb

## 2022-08-18 DIAGNOSIS — R102 Pelvic and perineal pain: Secondary | ICD-10-CM

## 2022-08-18 DIAGNOSIS — Z794 Long term (current) use of insulin: Secondary | ICD-10-CM | POA: Diagnosis not present

## 2022-08-18 DIAGNOSIS — R29898 Other symptoms and signs involving the musculoskeletal system: Secondary | ICD-10-CM | POA: Diagnosis not present

## 2022-08-18 DIAGNOSIS — E1165 Type 2 diabetes mellitus with hyperglycemia: Secondary | ICD-10-CM

## 2022-08-18 DIAGNOSIS — G8929 Other chronic pain: Secondary | ICD-10-CM | POA: Diagnosis not present

## 2022-08-18 DIAGNOSIS — I152 Hypertension secondary to endocrine disorders: Secondary | ICD-10-CM

## 2022-08-18 DIAGNOSIS — I5042 Chronic combined systolic (congestive) and diastolic (congestive) heart failure: Secondary | ICD-10-CM

## 2022-08-18 DIAGNOSIS — E1159 Type 2 diabetes mellitus with other circulatory complications: Secondary | ICD-10-CM | POA: Diagnosis not present

## 2022-08-18 DIAGNOSIS — I110 Hypertensive heart disease with heart failure: Secondary | ICD-10-CM | POA: Diagnosis not present

## 2022-08-18 LAB — POCT GLYCOSYLATED HEMOGLOBIN (HGB A1C): HbA1c, POC (controlled diabetic range): 8.9 % — AB (ref 0.0–7.0)

## 2022-08-18 LAB — GLUCOSE, POCT (MANUAL RESULT ENTRY): POC Glucose: 135 mg/dl — AB (ref 70–99)

## 2022-08-18 MED ORDER — INSULIN LISPRO PROT & LISPRO (75-25 MIX) 100 UNIT/ML KWIKPEN
PEN_INJECTOR | SUBCUTANEOUS | 2 refills | Status: DC
  Filled 2022-08-18: qty 15, 39d supply, fill #0
  Filled 2022-10-14: qty 15, 40d supply, fill #0

## 2022-08-18 MED ORDER — CLOPIDOGREL BISULFATE 75 MG PO TABS
75.0000 mg | ORAL_TABLET | Freq: Every day | ORAL | 3 refills | Status: DC
  Filled 2022-08-18: qty 30, 30d supply, fill #0
  Filled 2022-10-27: qty 30, 30d supply, fill #1
  Filled 2022-12-11: qty 30, 30d supply, fill #2
  Filled 2023-01-26: qty 30, 30d supply, fill #3

## 2022-08-18 NOTE — Progress Notes (Signed)
Subjective:  Patient ID: Brooke [name], female    DOB: [birth year]  Age: 62 y.o. MRN: [id number]  CC: Diabetes (/)   HPI Brooke [name] is a 62 y.o. year old female with a history of  type 2 diabetes mellitus (A1c 8.9), HFmEF (EF 55 to 60% on echo of 04/2022 improved from 20 to 25%), CAD, hypertension, anxiety and depression, protein calorie malnutrition   Interval History: A1c is 8.9 today which is up from 8.0 one month ago She has a CGM - G7 but does not have it turned on and does not have data on it. She has had hypoglycemia of up to 60 around 4-6am and highest sugars are just before dinner. Time in range :  44% in range, 27% very high, 27% high, 1% low, <1% very low.  Her husband had also been hospitalized for 2 days due to brain bleed and her eating had not been consistent. She did receive epidural injections for her back last month but at that time states she had increased her insulin dose for 1 week to about 24 units twice daily. Left leg continues to hurt and is weak  Her pelvis also hurts and this feels like she is 'having a new period'.  She does have a history of endometriosis managed by Dr Romeo Rabon she has an upcoming appointment with.  When she received her ESI she felt like it went straight to her pelvic floor.   She also had a time when she tried to lift and it felt like she was lifting something so heavy and right hand felt weak. On another occasion she had difficulty starting the car due to weakness in her right hand.  At the moment she is asymptomatic and denies presence of numbness in her hands.  She continues to choke and I referred her to GI due to abnormal barium swallow finding of decreased esophageal motility from 05/2022.  She is yet to hear from GI. Barium swallow revealed: IMPRESSION: 1.  Moderately decreased esophageal motility   2.  No esophageal mass, no significant esophageal stricture  Last cardiology visit was in 05/2022 and she is stable from a  cardiac standpoint and has no dyspnea or chest pains. Past Medical History:  Diagnosis Date   Abdominal pain, left lower quadrant 12/17/2015   Chest pain, unspecified 10/05/2003   CHF (congestive heart failure) (HCC)    Depressive disorder, not elsewhere classified 07/31/2005   Diabetes mellitus without complication ([place]) 18/84/1660   Endometriosis 09/14/2012   Hypertension 11/23/2003   Unspecified constipation 06/24/2004    Past Surgical History:  Procedure Laterality Date   APPENDECTOMY  1996   Dr. Lindon Romp   BREAST SURGERY Bilateral 1996   Dr. Haskell Riling HERNIA REPAIR     OVARIAN CYST REMOVAL     RIGHT/LEFT HEART CATH AND CORONARY ANGIOGRAPHY N/A 10/09/2021   Procedure: RIGHT/LEFT HEART CATH AND CORONARY ANGIOGRAPHY;  Surgeon: Early Osmond, MD;  Location: [facility];  Service: Cardiovascular;  Laterality: N/A;   TOOTH EXTRACTION N/A 07/28/2022   Procedure: EXTRACTION ALL REMAING TEETH THREE, FOUR, FIVE, SIX, SEVEN, EIGHT, NINE, TEN, ELEVEN, TWELVE, TWENTY ONE, TWENTY TWO, TWENTY THREE, TWENTY FOUR, TWENTY FIVE, TWENTY SIX, TWENTY SEVEN, TWENTY EIGHT, TWENTY NINE, THIRTY, AVEOLOPLASTY;  Surgeon: Diona Browner, DMD;  Location: [facility];  Service: Oral Surgery;  Laterality: N/A;    Family History  Problem Relation Age of Onset   Diabetes Mother    Hypertension Mother  Arthritis Mother        RA   Pancreatic cancer Father    Stomach cancer Neg Hx    Colon cancer Neg Hx    Esophageal cancer Neg Hx    Rectal cancer Neg Hx     Social History   Socioeconomic History   Marital status: Married    Spouse name: Not on file   Number of children: Not on file   Years of education: Not on file   Highest education level: Not on file  Occupational History   Not on file  Tobacco Use   Smoking status: Never   Smokeless tobacco: Never  Vaping Use   Vaping Use: Never used  Substance and Sexual Activity   Alcohol use: No   Drug use: No   Sexual activity: Not  Currently  Other Topics Concern   Not on file  Social History Narrative   Not on file   Social Determinants of Health   Financial Resource Strain: Medium Risk (10/31/2021)   Overall Financial Resource Strain (CARDIA)    Difficulty of Paying Living Expenses: Somewhat hard  Food Insecurity: No Food Insecurity (10/31/2021)   Hunger Vital Sign    Worried About Running Out of Food in the Last Year: Never true    Ran Out of Food in the Last Year: Never true  Transportation Needs: No Transportation Needs (10/31/2021)   PRAPARE - Hydrologist (Medical): No    Lack of Transportation (Non-Medical): No  Physical Activity: Not on file  Stress: Not on file  Social Connections: Not on file    Allergies  Allergen Reactions   Aspirin     Intolerance (aspirin sensitive stomach)   Darvocet [Propoxyphene N-Acetaminophen]     Skin reaction on Vaginal area   Erythromycin Nausea And Vomiting   Gabapentin Other (See Comments)    Suicidal ideations    Ibuprofen     Stomach issues   Metformin And Related Nausea Only    Outpatient Medications Prior to Visit  Medication Sig Dispense Refill   acetaminophen (TYLENOL) 500 MG tablet Take 1,000 mg by mouth every 6 (six) hours as needed for mild pain, fever or headache.     aspirin 81 MG chewable tablet Chew 1 tablet (81 mg total) by mouth daily. 30 tablet 1   atorvastatin (LIPITOR) 80 MG tablet Take 1 tablet (80 mg total) by mouth daily. 30 tablet 3   Continuous Blood Gluc Receiver (DEXCOM G7 RECEIVER) DEVI Use to check blood sugar 3 times daily. 1 each 0   Continuous Blood Gluc Sensor (DEXCOM G7 SENSOR) MISC Use to check blood sugar 3 times daily. 3 each 2   dapagliflozin propanediol (FARXIGA) 10 MG TABS tablet Take 1 tablet (10 mg total) by mouth daily. 30 tablet 3   HYDROcodone-acetaminophen (NORCO) 5-325 MG tablet Take 1 tablet by mouth every 6 (six) hours as needed for moderate pain. 24 tablet 0   Insulin Pen Needle  (PENTIPS) 32G X 4 MM MISC Use as directed with insulin pen 100 each 0   metoprolol succinate (TOPROL-XL) 25 MG 24 hr tablet Take 1 tablet (25 mg total) by mouth daily. Take with or immediately following a meal. 90 tablet 3   Multiple Vitamin (MULTIVITAMIN WITH MINERALS) TABS tablet Take 1 tablet by mouth daily.     omeprazole (PRILOSEC) 40 MG capsule Take 1 capsule (40 mg total) by mouth daily. 30 capsule 1   sacubitril-valsartan (ENTRESTO) 49-51 MG Take 1  tablet by mouth 2 (two) times daily. 180 tablet 3   spironolactone (ALDACTONE) 25 MG tablet Take 1 tablet (25 mg total) by mouth daily. 30 tablet 2   vitamin B-12 (CYANOCOBALAMIN) 1000 MCG tablet Take 3,000 mcg by mouth daily.     amoxicillin (AMOXIL) 500 MG capsule Take 1 capsule (500 mg total) by mouth 3 (three) times daily. 21 capsule 0   clopidogrel (PLAVIX) 75 MG tablet Take 1 tablet (75 mg total) by mouth daily. 30 tablet 3   Insulin Lispro Prot & Lispro (HUMALOG MIX 75/25 KWIKPEN) (75-25) 100 UNIT/ML Kwikpen Inject 18 Units into the skin 2 (two) times daily. 15 mL 2   No facility-administered medications prior to visit.     ROS Review of Systems  Constitutional:  Negative for activity change and appetite change.  HENT:  Negative for sinus pressure and sore throat.   Respiratory:  Negative for chest tightness, shortness of breath and wheezing.   Cardiovascular:  Negative for chest pain and palpitations.  Gastrointestinal:  Negative for abdominal distention, abdominal pain and constipation.  Genitourinary:  Positive for pelvic pain.  Musculoskeletal:        See HPI  Psychiatric/Behavioral:  Negative for behavioral problems and dysphoric mood.     Objective:  BP 127/74 (BP Location: Left Arm, Patient Position: Sitting, Cuff Size: Small)   Pulse 76   Temp 97.8 F (36.6 C) (Oral)   Ht '5\' 4"'$  (1.626 m)   Wt 108 lb 9.6 oz (49.3 kg)   LMP 11/16/2014 (Approximate)   SpO2 99%   BMI 18.64 kg/m      08/18/2022   10:28 AM  07/28/2022    4:10 PM 07/28/2022    3:55 PM  BP/Weight  Systolic BP 481 856 314  Diastolic BP 74 61 58  Wt. (Lbs) 108.6    BMI 18.64 kg/m2        Physical Exam Constitutional:      Appearance: She is well-developed.  Cardiovascular:     Rate and Rhythm: Normal rate.     Heart sounds: Normal heart sounds. No murmur heard. Pulmonary:     Effort: Pulmonary effort is normal.     Breath sounds: Normal breath sounds. No wheezing or rales.  Chest:     Chest wall: No tenderness.  Abdominal:     General: Bowel sounds are normal. There is no distension.     Palpations: Abdomen is soft. There is no mass.     Tenderness: There is abdominal tenderness (pelvic region).  Musculoskeletal:        General: Normal range of motion.     Right lower leg: No edema.     Left lower leg: No edema.  Neurological:     Mental Status: She is alert and oriented to person, place, and time.     Comments: Normal handgrip bilaterally  Psychiatric:        Mood and Affect: Mood normal.        Latest Ref Rng & Units 07/28/2022   12:55 PM 06/04/2022   10:20 AM 05/01/2022    4:14 PM  CMP  Glucose 70 - 99 mg/dL 151  106  261   BUN 6 - 20 mg/dL '10  8  8   '$ Creatinine 0.44 - 1.00 mg/dL 0.77  0.55  0.65   Sodium 135 - 145 mmol/L 139  145  136   Potassium 3.5 - 5.1 mmol/L 4.5  3.7  3.4   Chloride 98 - 111 mmol/L  104  107  102   CO2 22 - 32 mmol/L '26  27  26   '$ Calcium 8.9 - 10.3 mg/dL 8.9  8.9  9.1     Lipid Panel     Component Value Date/Time   CHOL 147 02/20/2022 1548   CHOL 192 12/13/2015 0856   TRIG 134 02/20/2022 1548   HDL 42 02/20/2022 1548   HDL 41 12/13/2015 0856   CHOLHDL 3.5 02/20/2022 1548   VLDL 27 02/20/2022 1548   LDLCALC 78 02/20/2022 1548   LDLCALC 118 (H) 12/13/2015 0856    CBC    Component Value Date/Time   WBC 11.5 (H) 07/28/2022 1106   RBC 6.23 (H) 07/28/2022 1106   HGB 16.2 (H) 07/28/2022 1106   HCT 51.9 (H) 07/28/2022 1106   PLT 337 07/28/2022 1106   MCV 83.3  07/28/2022 1106   MCH 26.0 07/28/2022 1106   MCHC 31.2 07/28/2022 1106   RDW 14.4 07/28/2022 1106   LYMPHSABS 1.1 10/08/2021 1947   MONOABS 0.7 10/08/2021 1947   EOSABS 0.0 10/08/2021 1947   BASOSABS 0.0 10/08/2021 1947    Lab Results  Component Value Date   HGBA1C 8.9 (A) 08/18/2022    Assessment & Plan:  1. Type 2 diabetes mellitus with hyperglycemia, with long-term current use of insulin (HCC) Uncontrolled with A1c of 8.9, goal is less than 7.0 She does have fluctuations in her blood sugar but does not have data to review progress over 2 weeks Due to early morning hypoglycemia I will decrease her evening dose and increase morning dose of Humalog 75/25 due to the fact that she is having elevated random sugars Will reassess at next visit Counseled on Diabetic diet, my plate method, 885 minutes of moderate intensity exercise/week Blood sugar logs with fasting goals of 80-120 mg/dl, random of less than 180 and in the event of sugars less than 60 mg/dl or greater than 400 mg/dl encouraged to notify the clinic. Advised on the need for annual eye exams, annual foot exams, Pneumonia vaccine. - HgB A1c - Glucose (CBG) - Insulin Lispro Prot & Lispro (HUMALOG MIX 75/25 KWIKPEN) (75-25) 100 UNIT/ML Kwikpen; Inject 22 Units into the skin in the morning AND 16 Units every evening.  Dispense: 15 mL; Refill: 2  2. Right hand weakness She might be experiencing some form of neuropathy which is not demonstrable on my physical exam today She will also benefit from nerve conduction studies - AMB referral to orthopedics  3. Hypertensive heart disease with chronic combined systolic and diastolic congestive heart failure (HCC) Heart failure with modified EF which has improved from previous EF of 55 to 60% Euvolemic Continue with guideline directed medical therapy - SGLT2i, beta-blocker, spironolactone, Entresto Follow-up with cardiology  4. Hypertension associated with diabetes  ([place]) Controlled Continue antihypertensive regimen Counseled on blood pressure goal of less than 130/80, low-sodium, DASH diet, medication compliance, 150 minutes of moderate intensity exercise per week. Discussed medication compliance, adverse effects.  5. Chronic pelvic pain in female Uncontrolled She has an upcoming appointment with Dr. Wannetta Sender   She has been provided with the number to [facility] to schedule an appointment given ongoing choking and decreased esophageal motility on barium swallow.  Meds ordered this encounter  Medications   Insulin Lispro Prot & Lispro (HUMALOG MIX 75/25 KWIKPEN) (75-25) 100 UNIT/ML Kwikpen    Sig: Inject 22 Units into the skin in the morning AND 16 Units every evening.    Dispense:  15 mL  Refill:  2   clopidogrel (PLAVIX) 75 MG tablet    Sig: Take 1 tablet (75 mg total) by mouth daily.    Dispense:  30 tablet    Refill:  3    Follow-up: Return in about 3 months (around 11/17/2022) for Chronic medical conditions.       Charlott Rakes, MD, FAAFP. [facility] [place], [place]   08/18/2022, 12:27 PM

## 2022-08-18 NOTE — Progress Notes (Signed)
Follow up DM

## 2022-08-18 NOTE — Patient Instructions (Signed)
[facility] [address] [place], [place] [address] PH# [phone]

## 2022-08-19 ENCOUNTER — Other Ambulatory Visit: Payer: Self-pay

## 2022-08-25 ENCOUNTER — Telehealth: Payer: Self-pay | Admitting: Family Medicine

## 2022-08-25 DIAGNOSIS — G5601 Carpal tunnel syndrome, right upper limb: Secondary | ICD-10-CM | POA: Diagnosis not present

## 2022-08-25 DIAGNOSIS — G5602 Carpal tunnel syndrome, left upper limb: Secondary | ICD-10-CM | POA: Insufficient documentation

## 2022-08-25 DIAGNOSIS — R29898 Other symptoms and signs involving the musculoskeletal system: Secondary | ICD-10-CM | POA: Diagnosis not present

## 2022-08-25 DIAGNOSIS — M79641 Pain in right hand: Secondary | ICD-10-CM | POA: Insufficient documentation

## 2022-08-25 NOTE — Telephone Encounter (Signed)
Pt has ben called and informed to pick up meter from office.

## 2022-08-25 NOTE — Telephone Encounter (Signed)
Copied from [place] [id number]. Topic: General - Inquiry >> Aug 25, 2022  8:40 AM Penni Bombard wrote: Reason for CRM: pt called asking if the office had a sample of the Dexcom G7 sensor.  She had to reorder hers but has not got it yet.  CB#  [phone]

## 2022-08-28 ENCOUNTER — Other Ambulatory Visit: Payer: Self-pay

## 2022-08-31 LAB — HM DIABETES EYE EXAM

## 2022-09-02 DIAGNOSIS — G5621 Lesion of ulnar nerve, right upper limb: Secondary | ICD-10-CM | POA: Diagnosis not present

## 2022-09-02 DIAGNOSIS — G5601 Carpal tunnel syndrome, right upper limb: Secondary | ICD-10-CM | POA: Diagnosis not present

## 2022-09-02 DIAGNOSIS — G609 Hereditary and idiopathic neuropathy, unspecified: Secondary | ICD-10-CM | POA: Diagnosis not present

## 2022-09-16 ENCOUNTER — Telehealth: Payer: Self-pay

## 2022-09-16 ENCOUNTER — Encounter: Payer: Self-pay | Admitting: Physician Assistant

## 2022-09-16 ENCOUNTER — Ambulatory Visit: Payer: Medicaid Other | Admitting: Physician Assistant

## 2022-09-16 ENCOUNTER — Other Ambulatory Visit: Payer: Self-pay

## 2022-09-16 VITALS — BP 114/64 | HR 88 | Ht 63.39 in | Wt 106.5 lb

## 2022-09-16 DIAGNOSIS — R1310 Dysphagia, unspecified: Secondary | ICD-10-CM | POA: Diagnosis not present

## 2022-09-16 DIAGNOSIS — Z86010 Personal history of colonic polyps: Secondary | ICD-10-CM

## 2022-09-16 MED ORDER — NA SULFATE-K SULFATE-MG SULF 17.5-3.13-1.6 GM/177ML PO SOLN
ORAL | 0 refills | Status: DC
  Filled 2022-09-16: qty 354, 2d supply, fill #0

## 2022-09-16 NOTE — Telephone Encounter (Signed)
Okay to hold Plavix for 5 days

## 2022-09-16 NOTE — Progress Notes (Signed)
Agree with assessment and plan as outlined.  It looks like her stent was about a year ago, hopefully okay to hold Plavix for procedure, will await approval from cardiology.  Thanks

## 2022-09-16 NOTE — Telephone Encounter (Addendum)
  [facility] Pre-operative Risk Assessment     Request for surgical clearance:     Endoscopy Procedure  What type of surgery is being performed? Colonoscopy and Endoscopy   When is this surgery scheduled?     10/22/22  What type of clearance is required ?   Pharmacy  Are there any medications that need to be held prior to surgery and how long? Plavix 5 days hold   Practice name and name of physician performing surgery?      [facility]  What is your office phone and fax number?      Phone- [phone]  [phone]  Anesthesia type (None, local, MAC, general) ?       MAC

## 2022-09-16 NOTE — Patient Instructions (Addendum)
You have been scheduled for an endoscopy and colonoscopy. Please follow the written instructions given to you at your visit today. Please pick up your prep supplies at the pharmacy within the next 1-3 days. If you use inhalers (even only as needed), please bring them with you on the day of your procedure.   Continue taking Omeprazole 40 mg daily 30-60 minutes before meals  _______________________________________________________  If your blood pressure at your visit was 140/90 or greater, please contact your primary care physician to follow up on this.  _______________________________________________________  If you are age 50 or older, your body mass index should be between 23-30. Your Body mass index is 18.63 kg/m. If this is out of the aforementioned range listed, please consider follow up with your Primary Care Provider.  If you are age 60 or younger, your body mass index should be between 19-25. Your Body mass index is 18.63 kg/m. If this is out of the aformentioned range listed, please consider follow up with your Primary Care Provider.   ________________________________________________________  The [facility] providers would like to encourage you to use [facility] to communicate with providers for non-urgent requests or questions.  Due to long hold times on the telephone, sending your provider a message by [facility] may be a faster and more efficient way to get a response.  Please allow 48 business hours for a response.  Please remember that this is for non-urgent requests.   Due to recent changes in healthcare laws, you may see the results of your imaging and laboratory studies on MyChart before your provider has had a chance to review them.  We understand that in some cases there may be results that are confusing or concerning to you. Not all laboratory results come back in the same time frame and the provider may be waiting for multiple results in order to interpret others.  Please give [place]  48 hours in order for your provider to thoroughly review all the results before contacting the office for clarification of your results.    Thank you for entrusting me with your care and choosing [facility].  Ellouise Newer PA-C

## 2022-09-16 NOTE — Progress Notes (Signed)
Chief Complaint: Dysphagia  HPI:    Brooke [name] is a 62 year old female, previously known to Dr. Ardis Hughs, with a past medical history as listed below including CHF (05/01/2022 echo with LVEF 55-60%) and CAD status post stent in 2023 on Plavix and aspirin, diabetes and multiple others, who was referred to me by Charlott Rakes, MD for a complaint of dysphagia.     04/2015 colonoscopy Dr. Ardis Hughs with one 20 mm pedunculated polyp and diverticulosis.  Pathology showed adenoma.  Repeat was recommended in 3 years    11/15/20 office visit with me to follow-up from a recent ER visit for abdominal pain.  At that time discussed chronic pelvic pain and abdominal pain.  She was scheduled for an EGD and colonoscopy.  Continued on Bentyl and given Omeprazole.    12/11/2020 EGD. EGD with mild inflammation characterized by erythema and granularity in the gastric antrum.  Patient never had colonoscopy.    Today, patient presents to clinic and tells me that she never could do her colonoscopy because of insurance problems.  Tells me she would like to have it done now given that she had a large polyp and is way overdue.      Also describes that she has been having trouble swallowing, this has been going on for at least the past 6 months.  Tells me she even chokes on saliva at times.  She had an esophagram in November as above which showed some mild dysmotility.  She would like this to get checked out as well.  Currently on Omeprazole 40 mg daily.    Denies fever, chills, weight loss, nausea, vomiting or symptoms that awaken her from sleep.    Past Medical History:  Diagnosis Date   Abdominal pain, left lower quadrant 12/17/2015   Chest pain, unspecified 10/05/2003   CHF (congestive heart failure) (HCC)    Depressive disorder, not elsewhere classified 07/31/2005   Diabetes mellitus without complication ([place]) 22/29/7989   Endometriosis 09/14/2012   Hypertension 11/23/2003   Unspecified constipation 06/24/2004     Past Surgical History:  Procedure Laterality Date   APPENDECTOMY  1996   Dr. Lindon Romp   BREAST SURGERY Bilateral 1996   Dr. Haskell Riling HERNIA REPAIR     OVARIAN CYST REMOVAL     RIGHT/LEFT HEART CATH AND CORONARY ANGIOGRAPHY N/A 10/09/2021   Procedure: RIGHT/LEFT HEART CATH AND CORONARY ANGIOGRAPHY;  Surgeon: Early Osmond, MD;  Location: [facility];  Service: Cardiovascular;  Laterality: N/A;   TOOTH EXTRACTION N/A 07/28/2022   Procedure: EXTRACTION ALL REMAING TEETH THREE, FOUR, FIVE, SIX, SEVEN, EIGHT, NINE, TEN, ELEVEN, TWELVE, TWENTY ONE, TWENTY TWO, TWENTY THREE, TWENTY FOUR, TWENTY FIVE, TWENTY SIX, TWENTY SEVEN, TWENTY EIGHT, TWENTY NINE, THIRTY, AVEOLOPLASTY;  Surgeon: Diona Browner, DMD;  Location: [facility];  Service: Oral Surgery;  Laterality: N/A;    Current Outpatient Medications  Medication Sig Dispense Refill   acetaminophen (TYLENOL) 500 MG tablet Take 1,000 mg by mouth every 6 (six) hours as needed for mild pain, fever or headache.     aspirin 81 MG chewable tablet Chew 1 tablet (81 mg total) by mouth daily. 30 tablet 1   atorvastatin (LIPITOR) 80 MG tablet Take 1 tablet (80 mg total) by mouth daily. 30 tablet 3   clopidogrel (PLAVIX) 75 MG tablet Take 1 tablet (75 mg total) by mouth daily. 30 tablet 3   Continuous Blood Gluc Receiver (DEXCOM G7 RECEIVER) DEVI Use to check blood sugar 3 times daily.  1 each 0   Continuous Blood Gluc Sensor (DEXCOM G7 SENSOR) MISC Use to check blood sugar 3 times daily. 3 each 2   dapagliflozin propanediol (FARXIGA) 10 MG TABS tablet Take 1 tablet (10 mg total) by mouth daily. 30 tablet 3   HYDROcodone-acetaminophen (NORCO) 5-325 MG tablet Take 1 tablet by mouth every 6 (six) hours as needed for moderate pain. 24 tablet 0   Insulin Lispro Prot & Lispro (HUMALOG MIX 75/25 KWIKPEN) (75-25) 100 UNIT/ML Kwikpen Inject 22 Units into the skin in the morning AND 16 Units every evening. 15 mL 2   Insulin Pen Needle (PENTIPS) 32G X 4  MM MISC Use as directed with insulin pen 100 each 0   metoprolol succinate (TOPROL-XL) 25 MG 24 hr tablet Take 1 tablet (25 mg total) by mouth daily. Take with or immediately following a meal. 90 tablet 3   Multiple Vitamin (MULTIVITAMIN WITH MINERALS) TABS tablet Take 1 tablet by mouth daily.     omeprazole (PRILOSEC) 40 MG capsule Take 1 capsule (40 mg total) by mouth daily. 30 capsule 1   sacubitril-valsartan (ENTRESTO) 49-51 MG Take 1 tablet by mouth 2 (two) times daily. 180 tablet 3   spironolactone (ALDACTONE) 25 MG tablet Take 1 tablet (25 mg total) by mouth daily. 30 tablet 2   vitamin B-12 (CYANOCOBALAMIN) 1000 MCG tablet Take 3,000 mcg by mouth daily.     No current facility-administered medications for this visit.    Allergies as of 09/16/2022 - Review Complete 08/18/2022  Allergen Reaction Noted   Aspirin  11/08/2011   Darvocet [propoxyphene n-acetaminophen]  11/08/2011   Erythromycin Nausea And Vomiting 03/28/2013   Gabapentin Other (See Comments) 11/07/2020   Ibuprofen  03/28/2013   Metformin and related Nausea Only 07/01/2016    Family History  Problem Relation Age of Onset   Diabetes Mother    Hypertension Mother    Arthritis Mother        RA   Pancreatic cancer Father    Stomach cancer Neg Hx    Colon cancer Neg Hx    Esophageal cancer Neg Hx    Rectal cancer Neg Hx     Social History   Socioeconomic History   Marital status: Married    Spouse name: Not on file   Number of children: Not on file   Years of education: Not on file   Highest education level: Not on file  Occupational History   Not on file  Tobacco Use   Smoking status: Never   Smokeless tobacco: Never  Vaping Use   Vaping Use: Never used  Substance and Sexual Activity   Alcohol use: No   Drug use: No   Sexual activity: Not Currently  Other Topics Concern   Not on file  Social History Narrative   Not on file   Social Determinants of Health   Financial Resource Strain: Medium  Risk (10/31/2021)   Overall Financial Resource Strain (CARDIA)    Difficulty of Paying Living Expenses: Somewhat hard  Food Insecurity: No Food Insecurity (10/31/2021)   Hunger Vital Sign    Worried About Running Out of Food in the Last Year: Never true    Ran Out of Food in the Last Year: Never true  Transportation Needs: No Transportation Needs (10/31/2021)   PRAPARE - Hydrologist (Medical): No    Lack of Transportation (Non-Medical): No  Physical Activity: Not on file  Stress: Not on file  Social Connections: Not  on file  Intimate Partner Violence: Not on file    Review of Systems:    Constitutional: No weight loss, fever or chills Cardiovascular: No chest pain, chest pressure or palpitations   Respiratory: No SOB  Gastrointestinal: See HPI and otherwise negative   Physical Exam:  Vital signs: BP 114/64 (BP Location: Left Arm, Patient Position: Sitting, Cuff Size: Normal)   Pulse 88   Ht 5' 3.39" (1.61 m)   Wt 106 lb 8 oz (48.3 kg)   LMP 11/16/2014 (Approximate)   BMI 18.63 kg/m    Constitutional:   Pleasant thin appearing Caucasian female appears to be in NAD, Well developed, Well nourished, alert and cooperative Respiratory: Respirations even and unlabored. Lungs clear to auscultation bilaterally.   No wheezes, crackles, or rhonchi.  Cardiovascular: Normal S1, S2. No MRG. Regular rate and rhythm. No peripheral edema, cyanosis or pallor.  Gastrointestinal:  Soft, nondistended, nontender. No rebound or guarding. Normal bowel sounds. No appreciable masses or hepatomegaly. Psychiatric: Demonstrates good judgement and reason without abnormal affect or behaviors.  RELEVANT LABS AND IMAGING: CBC    Component Value Date/Time   WBC 11.5 (H) 07/28/2022 1106   RBC 6.23 (H) 07/28/2022 1106   HGB 16.2 (H) 07/28/2022 1106   HCT 51.9 (H) 07/28/2022 1106   PLT 337 07/28/2022 1106   MCV 83.3 07/28/2022 1106   MCH 26.0 07/28/2022 1106   MCHC 31.2  07/28/2022 1106   RDW 14.4 07/28/2022 1106   LYMPHSABS 1.1 10/08/2021 1947   MONOABS 0.7 10/08/2021 1947   EOSABS 0.0 10/08/2021 1947   BASOSABS 0.0 10/08/2021 1947    CMP     Component Value Date/Time   NA 139 07/28/2022 1255   NA 145 (H) 06/04/2022 1020   K 4.5 07/28/2022 1255   CL 104 07/28/2022 1255   CO2 26 07/28/2022 1255   GLUCOSE 151 (H) 07/28/2022 1255   BUN 10 07/28/2022 1255   BUN 8 06/04/2022 1020   CREATININE 0.77 07/28/2022 1255   CREATININE 0.75 02/01/2017 1628   CALCIUM 8.9 07/28/2022 1255   PROT 7.4 02/20/2022 1548   PROT 7.4 03/28/2013 1511   ALBUMIN 3.8 02/20/2022 1548   ALBUMIN 4.5 03/28/2013 1511   AST 27 02/20/2022 1548   ALT 32 02/20/2022 1548   ALKPHOS 70 02/20/2022 1548   BILITOT 0.3 02/20/2022 1548   GFRNONAA >60 07/28/2022 1255   GFRNONAA >89 02/01/2017 1628   GFRAA >89 02/01/2017 1628    Assessment: 1.  History of adenomatous polyp: Last colonoscopy in 2016 with repeat recommended in 3 years, patient is overdue for surveillance 2.  Dysphagia: Recent esophagram showing mild dysmotility; consider other esophageal abnormality as well such as stricture or ring or web 3.  History of CAD status post stent in 2023: On Plavix and aspirin  Plan: 1.  Scheduled patient for a diagnostic EGD with dilation and surveillance colonoscopy in the [facility] with Dr. Havery Moros.  Did provide the patient a detailed list of risks for the procedures and she agrees to proceed. Patient is appropriate for endoscopic procedure(s) in the ambulatory ([place]) setting.  2.  Patient advised to hold her Plavix for 5 days prior to time of procedure.  We will communicate with her prescribing session to ensure this is acceptable for her. 3.  Continue Omeprazole 40 mg daily for now. 4.  Reviewed antireflux and antidysphagia measures. 5.  Patient to follow in clinic per recommendations after time of procedures.  Note sent to Dr. Havery Moros in lieu of  Dr. Ardis Hughs absence.  Ellouise Newer,  PA-C [facility] 09/16/2022, 8:23 AM  Cc: Charlott Rakes, MD

## 2022-09-17 ENCOUNTER — Telehealth: Payer: Self-pay

## 2022-09-17 NOTE — Telephone Encounter (Signed)
Spoke to patient advised per Dr Margarita Rana it is okay to hold Plavix 5 days before procedure. Patient verbalized understanding.

## 2022-09-18 ENCOUNTER — Other Ambulatory Visit: Payer: Self-pay

## 2022-09-21 ENCOUNTER — Other Ambulatory Visit: Payer: Self-pay

## 2022-09-21 ENCOUNTER — Ambulatory Visit: Payer: Medicaid Other | Admitting: Obstetrics and Gynecology

## 2022-09-21 ENCOUNTER — Encounter: Payer: Self-pay | Admitting: Obstetrics and Gynecology

## 2022-09-21 VITALS — BP 127/82 | HR 80

## 2022-09-21 DIAGNOSIS — N3941 Urge incontinence: Secondary | ICD-10-CM | POA: Diagnosis not present

## 2022-09-21 DIAGNOSIS — R159 Full incontinence of feces: Secondary | ICD-10-CM | POA: Diagnosis not present

## 2022-09-21 DIAGNOSIS — M62838 Other muscle spasm: Secondary | ICD-10-CM

## 2022-09-21 MED ORDER — SOLIFENACIN SUCCINATE 5 MG PO TABS
5.0000 mg | ORAL_TABLET | Freq: Every day | ORAL | 5 refills | Status: DC
  Filled 2022-09-21: qty 30, 30d supply, fill #0

## 2022-09-21 NOTE — Patient Instructions (Addendum)
Accidental Bowel Leakage: Our goal is to achieve formed bowel movements daily or every-other-day without leakage.  You may need to try different combinations of the following options to find what works best for you.  Some management options include: Dietary changes (more leafy greens, vegetables and fruits; less processed foods) Fiber supplementation (Metamucil or something with psyllium as active ingredient)- take once daily  Over-the-counter imodium (tablets or liquid) to help solidify the stool and prevent leakage of stool.  We discussed the symptoms of overactive bladder (OAB), which include urinary urgency, urinary frequency, night-time urination, with or without urge incontinence.  We discussed management including behavioral therapy (decreasing bladder irritants by following a bladder diet, urge suppression strategies, timed voids, bladder retraining), physical therapy, medication; and for refractory cases posterior tibial nerve stimulation, sacral neuromodulation, and intravesical botulinum toxin injection.   Prescribed vesicare 53m daily.

## 2022-09-21 NOTE — Progress Notes (Signed)
[facility] Return Visit  SUBJECTIVE  History of Present Illness: Brooke [name] is a 62 y.o. female seen in follow-up for incontinence and pelvic pain. Last seen Aug 2022.   Has increasing bladder incontinence. Will get up to go to the bathroom and has large leakage. Has frequency but that doesn't bother her as much as the leakage. Has not been on any medication for her bladder. She is drinking 96oz of water per day and avoids caffeine.   Has a sharp, stabbing pelvic pain deeper inside. Notices increased pain with incontinence. Does not have symptoms of vaginal pressure or prolapse. She has been having bowel leakage that is worsening the last few months. Has an appt for a colonoscopy in March.   Has been to pelvic PT in 2022 and did not see much difference in her symptoms. Has been getting injections into her back. She has an injection scheduled then end of Feb.   Recently diagnosed with CHF. Has DM and this has been better controlled but last A1c was 8.9%.   Past Medical History: Patient  has a past medical history of Abdominal pain, left lower quadrant (12/17/2015), Chest pain, unspecified (10/05/2003), CHF (congestive heart failure) ([place]), Depressive disorder, not elsewhere classified (07/31/2005), Diabetes mellitus without complication ([place]) (123456), Endometriosis (09/14/2012), Hypertension (11/23/2003), and Unspecified constipation (06/24/2004).   Past Surgical History: She  has a past surgical history that includes Inguinal hernia repair; Ovarian cyst removal; Breast surgery (Bilateral, 1996); Appendectomy (1996); RIGHT/LEFT HEART CATH AND CORONARY ANGIOGRAPHY (N/A, 10/09/2021); and Tooth Extraction (N/A, 07/28/2022).   Medications: She has a current medication list which includes the following prescription(s): acetaminophen, aspirin, atorvastatin, clopidogrel, dexcom g7 receiver, dexcom g7 sensor, dapagliflozin propanediol, hydrocodone-acetaminophen, insulin lispro prot  & lispro, pentips, metoprolol succinate, multivitamin with minerals, omeprazole, sacubitril-valsartan, solifenacin, spironolactone, and cyanocobalamin.   Allergies: Patient is allergic to aspirin, darvocet [propoxyphene n-acetaminophen], erythromycin, gabapentin, ibuprofen, and metformin and related.   Social History: Patient  reports that she has never smoked. She has never used smokeless tobacco. She reports that she does not drink alcohol and does not use drugs.      OBJECTIVE     Physical Exam: Vitals:   09/21/22 1550  BP: 127/82  Pulse: 80   Gen: No apparent distress, A&O x 3.  Detailed Urogynecologic Evaluation:  Deferred.    ASSESSMENT AND PLAN    Brooke [name] is a 62 y.o. with:  1. Urge incontinence   2. Incontinence of feces, unspecified fecal incontinence type   3. Levator spasm    Urge incontinence - We discussed the symptoms of overactive bladder (OAB), which include urinary urgency, urinary frequency, nocturia, with or without urge incontinence.  While we do not know the exact etiology of OAB, several treatment options exist. We discussed management including behavioral therapy (decreasing bladder irritants, urge suppression strategies, timed voids, bladder retraining), physical therapy, medication; for refractory cases posterior tibial nerve stimulation, sacral neuromodulation, and intravesical botulinum toxin injection.  - Prescribed vesicare 22m daily. For anticholinergic medications, we discussed the potential side effects of anticholinergics including dry eyes, dry mouth, constipation, cognitive impairment and urinary retention. - She could be a good candidate for SNM since she also has bowel leakage symptoms but we discussed that her A1c must be consistently under 8% to avoid infection.   2. Accidental Bowel Leakage:  - Treatment options include anti-diarrhea medication (loperamide/ Imodium OTC or prescription lomotil), fiber supplements, physical therapy, and  possible sacral neuromodulation or surgery.   - Recommended starting  with daily psyllium fiber for stool bulking. Can also take imodium if needed.   3. Levator spasm - we discussed options of trigger point injections but reviewed that this is not meant to be a permanent therapy, so would recommend in conjunction with pelvic PT. She is unsure if PT helped her in the past.  - She has an injection for her back coming up, so we discussed that sometimes controlling back pain can also improve pelvic pain. So will wait to do any treatment at this time.   Return 6 weeks for follow up   Jaquita Folds, MD

## 2022-09-22 ENCOUNTER — Other Ambulatory Visit: Payer: Self-pay

## 2022-09-24 ENCOUNTER — Telehealth: Payer: Self-pay | Admitting: Family Medicine

## 2022-09-24 DIAGNOSIS — H3500 Unspecified background retinopathy: Secondary | ICD-10-CM

## 2022-09-24 NOTE — Telephone Encounter (Signed)
Lauren from [employer] [facility] is calling to see if PCP received orders that the pt is needing a Caradic for her eyes due to the pt being diabetic. Per Ander Purpura she faxed the paper work over on 08/17/22 and still have not gotten anything faxed back to her and is re faxing the paper work over again today. Lauren is wanting to see if pt can be seen by PCP this week since this is needing to be done as soon as possible.  Please advise  Lauren phone number: [phone] Email: [email or web address] (Lauren states that she will be out of the office on 09/25/2022 but can be reached by email. Lauren states once pt is scheduled to see PCP please reach out to her by email or phone to let her know when the pt appt is so that she can let Dr. Jonna Clark know.

## 2022-09-24 NOTE — Telephone Encounter (Signed)
Patient is needing a Carotid US per [facility], paperwork has been placed in the scan folder for you to review.

## 2022-09-25 NOTE — Telephone Encounter (Signed)
Referral has been placed.  Please inform [facility] specialist.  Thanks.

## 2022-09-28 ENCOUNTER — Other Ambulatory Visit: Payer: Self-pay

## 2022-09-28 ENCOUNTER — Telehealth: Payer: Self-pay | Admitting: Family Medicine

## 2022-09-28 NOTE — Telephone Encounter (Signed)
Patient will be called with appointment details once order is changed by PCP

## 2022-09-28 NOTE — Telephone Encounter (Signed)
Order has been changed

## 2022-09-28 NOTE — Progress Notes (Signed)
Office Visit    Patient Name: Tannah Vossler Date of Encounter: 09/28/2022  Primary Care Provider:  Hoy Register, MD Primary Cardiologist:  Donato Schultz, MD Primary Electrophysiologist: None  Chief Complaint    Arica Proefrock is a 62 y.o. female with PMH of CAD, HFrEF, DM type II, HLD who presents today for 42-month follow-up.  Past Medical History    Past Medical History:  Diagnosis Date   Abdominal pain, left lower quadrant 12/17/2015   Chest pain, unspecified 10/05/2003   CHF (congestive heart failure) (HCC)    Depressive disorder, not elsewhere classified 07/31/2005   Diabetes mellitus without complication (HCC) 09/08/2006   Endometriosis 09/14/2012   Hypertension 11/23/2003   Unspecified constipation 06/24/2004   Past Surgical History:  Procedure Laterality Date   APPENDECTOMY  1996   Dr. Wiliam Ke   BREAST SURGERY Bilateral 1996   Dr. Margarito Liner HERNIA REPAIR     OVARIAN CYST REMOVAL     RIGHT/LEFT HEART CATH AND CORONARY ANGIOGRAPHY N/A 10/09/2021   Procedure: RIGHT/LEFT HEART CATH AND CORONARY ANGIOGRAPHY;  Surgeon: Orbie Pyo, MD;  Location: [facility];  Service: Cardiovascular;  Laterality: N/A;   TOOTH EXTRACTION N/A 07/28/2022   Procedure: EXTRACTION ALL REMAING TEETH THREE, FOUR, FIVE, SIX, SEVEN, EIGHT, NINE, TEN, ELEVEN, TWELVE, TWENTY ONE, TWENTY TWO, TWENTY THREE, TWENTY FOUR, TWENTY FIVE, TWENTY SIX, TWENTY SEVEN, TWENTY EIGHT, TWENTY NINE, THIRTY, AVEOLOPLASTY;  Surgeon: Ocie Doyne, DMD;  Location: [facility] OR;  Service: Oral Surgery;  Laterality: N/A;    Allergies  Allergies  Allergen Reactions   Aspirin     Intolerance (aspirin sensitive stomach)   Darvocet [Propoxyphene N-Acetaminophen]     Skin reaction on Vaginal area   Erythromycin Nausea And Vomiting   Gabapentin Other (See Comments)    Suicidal ideations    Ibuprofen     Stomach issues   Metformin And Related Nausea Only    History of Present Illness    Talan Cartwright  is a 62 year old female with the above mention past medical history who presents today for 73-month follow-up of congestive heart failure.  Ms. Eun was initially seen during admission on 10/08/2021 for complaint of shortness of breath and found to have cardiogenic shock with biventricular heart failure.  She was found to have sinus tachycardia with elevated lactate and hypoxic respiratory failure presentation.  BNP and troponins were elevated and patient was placed on BiPAP and treated with diuretics with good response.  Chest CT negative for PE, + for moderate b/l pleural effusions and multivessel coronary artery calcifications.  2D echo was completed showing EF of 20-25% with severely decreased LV function.  She was started on milrinone and low-dose captopril for afterload reduction.  She underwent a R/LHC that showed severe multivessel disease w/ severe LV dysfunction. RHC (on milrinone 0.25 mg/kg/min) showed low filling pressures w/ Fick cardiac output of 5.2 L/min and Fick cardiac index of 3.4 L/min, PA sat 68%.  AHF team was consulted for further management and patient's milrinone was titrated and mechanical support was felt not to be necessary due to improvement with milrinone.  Patient was consulted by CVTS and found not to be a good candidate for bypass given diffuse disease and coronary stent with no good targets.  Medical therapy was recommended patient was placed on ASA and Plavix.  Milrinone was weaned off and patient's GDMT was titrated prior to discharge home.  She was seen for [facility] visit by Boyce Medici,  PA on 11/25/2021 she was noted to have some mild exertional fatigue and dyspnea with NYHA class II symptoms.  She reported full medication compliance and blood pressure was well-controlled.  She was noted to have accidentally stopped her digoxin and this was restarted during follow-up.  Repeat echo was completed on 05/01/2022 with EF of 55-60% and grade 1 DD with no RWMA and no evidence  of MVR or AVR.  She was seen by Dr. Anne Fu on 06/04/2022 and reported doing well and was in the process of undergoing epidural shots for her back and left leg nerve pain.  She was maintained on current GDMT and digoxin was discontinued with recovery of LV function but is continuing to work on protein calorie malnutrition.   Ms. Arnn presents today for 77-month follow-up with her husband and daughter.  Since last being seen in the office patient reports that she is doing well with no new cardiac complaints.  She is tolerating her current medications without any adverse reactions.  Her blood pressure today is well-controlled at 114/60 and heart rate was 89 bpm.  She is euvolemic on exam today and denies any shortness of breath with activity.  She is staying active with walking but has difficulty maintaining long distances with her sciatica.  She is currently having epidural steroid injections with little to no relief of symptoms.  She is scheduled to undergo a colonoscopy next month and is able to complete greater than 4 METS of activity without any difficulty.  Therefore she would be at acceptable risk to proceed with colonoscopy at this time.  Patient denies chest pain, palpitations, dyspnea, PND, orthopnea, nausea, vomiting, dizziness, syncope, edema, weight gain, or early satiety.   Home Medications    Current Outpatient Medications  Medication Sig Dispense Refill   acetaminophen (TYLENOL) 500 MG tablet Take 1,000 mg by mouth every 6 (six) hours as needed for mild pain, fever or headache.     aspirin 81 MG chewable tablet Chew 1 tablet (81 mg total) by mouth daily. 30 tablet 1   atorvastatin (LIPITOR) 80 MG tablet Take 1 tablet (80 mg total) by mouth daily. 30 tablet 3   clopidogrel (PLAVIX) 75 MG tablet Take 1 tablet (75 mg total) by mouth daily. 30 tablet 3   Continuous Blood Gluc Receiver (DEXCOM G7 RECEIVER) DEVI Use to check blood sugar 3 times daily. 1 each 0   Continuous Blood Gluc Sensor  (DEXCOM G7 SENSOR) MISC Use to check blood sugar 3 times daily. 3 each 2   dapagliflozin propanediol (FARXIGA) 10 MG TABS tablet Take 1 tablet (10 mg total) by mouth daily. 30 tablet 3   HYDROcodone-acetaminophen (NORCO) 5-325 MG tablet Take 1 tablet by mouth every 6 (six) hours as needed for moderate pain. 24 tablet 0   Insulin Lispro Prot & Lispro (HUMALOG MIX 75/25 KWIKPEN) (75-25) 100 UNIT/ML Kwikpen Inject 22 Units into the skin in the morning AND 16 Units every evening. 15 mL 2   Insulin Pen Needle (PENTIPS) 32G X 4 MM MISC Use as directed with insulin pen 100 each 0   metoprolol succinate (TOPROL-XL) 25 MG 24 hr tablet Take 1 tablet (25 mg total) by mouth daily. Take with or immediately following a meal. 90 tablet 3   Multiple Vitamin (MULTIVITAMIN WITH MINERALS) TABS tablet Take 1 tablet by mouth daily.     omeprazole (PRILOSEC) 40 MG capsule Take 1 capsule (40 mg total) by mouth daily. 30 capsule 1   sacubitril-valsartan (ENTRESTO) 49-51 MG  Take 1 tablet by mouth 2 (two) times daily. 180 tablet 3   solifenacin (VESICARE) 5 MG tablet Take 1 tablet (5 mg total) by mouth daily. 30 tablet 5   spironolactone (ALDACTONE) 25 MG tablet Take 1 tablet (25 mg total) by mouth daily. 30 tablet 2   vitamin B-12 (CYANOCOBALAMIN) 1000 MCG tablet Take 3,000 mcg by mouth daily.     No current facility-administered medications for this visit.     Review of Systems  Please see the history of present illness.    (+) Low back pain (+) Chronic shortness of breath  All other systems reviewed and are otherwise negative except as noted above.  Physical Exam    Wt Readings from Last 3 Encounters:  09/16/22 106 lb 8 oz (48.3 kg)  08/18/22 108 lb 9.6 oz (49.3 kg)  07/28/22 118 lb (53.5 kg)   NF:AOZHY were no vitals filed for this visit.,There is no height or weight on file to calculate BMI.  Constitutional:      Appearance: Healthy appearance. Not in distress.  Neck:     Vascular: JVD normal.   Pulmonary:     Effort: Pulmonary effort is normal.     Breath sounds: No wheezing. No rales. Diminished in the bases Cardiovascular:     Normal rate. Regular rhythm. Normal S1. Normal S2.      Murmurs: There is no murmur.  Edema:    Peripheral edema absent.  Abdominal:     Palpations: Abdomen is soft non tender. There is no hepatomegaly.  Skin:    General: Skin is warm and dry.  Neurological:     General: No focal deficit present.     Mental Status: Alert and oriented to person, place and time.     Cranial Nerves: Cranial nerves are intact.  EKG/LABS/Other Studies Reviewed    ECG personally reviewed by me today -sinus rhythm with biatrial enlargement and nonspecific T wave abnormalities with heart rate of 89 bpm and no acute changes consistent with previous EKG.   Lab Results  Component Value Date   CREATININE 0.77 07/28/2022   BUN 10 07/28/2022   NA 139 07/28/2022   K 4.5 07/28/2022   CL 104 07/28/2022   CO2 26 07/28/2022   Lab Results  Component Value Date   ALT 32 02/20/2022   AST 27 02/20/2022   ALKPHOS 70 02/20/2022   BILITOT 0.3 02/20/2022   Lab Results  Component Value Date   CHOL 147 02/20/2022   HDL 42 02/20/2022   LDLCALC 78 02/20/2022   TRIG 134 02/20/2022   CHOLHDL 3.5 02/20/2022    Lab Results  Component Value Date   HGBA1C 8.9 (A) 08/18/2022    Assessment & Plan    1.  Chronic biventricular heart failure: -2D echo on 10/2021 with EF of 20-25% and repeat 2D echo completed 04/2022 with recovered EF of 55-60% -Today patient is euvolemic on examination -Continue current GDMT with Farxiga 10 mg daily, Toprol 25 mg daily, spironolactone 25 mg daily and Entresto 49/51 mg twice daily -Low sodium diet, fluid restriction <2L, and daily weights encouraged. Educated to contact our office for weight gain of 2 lbs overnight or 5 lbs in one week.   2.  Coronary artery disease -s/p R/LHC completed 10/2021 with severe multivessel disease and patient deemed not a  candidate for bypass due to poor vasculature and no targets for bypass. -Today patient reports no chest pain or shortness of breath with activity -Continue current GDMT with  ASA 81 mg, Plavix 75 mg, Toprol 25 mg daily, and Lipitor 80 mg daily  3.  Hyperlipidemia: -Patient's last LDL was 78 slightly above goal of less than 70 -Continue Lipitor 80 mg daily  4.  DM type II: -Patient's last hemoglobin A1c was above goal at 8.9 and is currently followed by PCP -Continue Farxiga 10 mg daily and current Humalog 75/25 mix   5.  Preoperative clearance: -The patient affirms she has been doing well without any new cardiac symptoms. They are able to achieve 4 METS without cardiac limitations. Therefore, based on ACC/AHA guidelines, the patient would be at acceptable risk for the planned procedure without further cardiovascular testing. The patient was advised that if she develops new symptoms prior to surgery to contact our office to arrange for a follow-up visit, and she verbalized understanding.   -She will receive guidance on holding Plavix and ASA from PCP.  -Disposition: Follow-up with Donato Schultz, MD or APP in 6 months   Medication Adjustments/Labs and Tests Ordered: Current medicines are reviewed at length with the patient today.  Concerns regarding medicines are outlined above.   Signed, Napoleon Form, Leodis Rains, NP 09/28/2022, 12:52 PM [facility]  Note:  This document was prepared using Dragon voice recognition software and may include unintentional dictation errors.

## 2022-09-28 NOTE — Telephone Encounter (Signed)
Copied from [place] [id number]. Topic: General - Other >> Sep 28, 2022  8:45 AM Chapman Fitch wrote: Reason for CRM: Pt received a call from [facility] and they advised they have been trying to reach the office to get an order for a Doppler study on the left side of the pts neck / please advise asap /  [facility] phone# [phone] / [phone] - Fax: [facility]

## 2022-09-28 NOTE — Telephone Encounter (Signed)
Can you change the order to VAS carotid US

## 2022-09-29 ENCOUNTER — Ambulatory Visit: Payer: Medicaid Other | Attending: Nurse Practitioner | Admitting: Nurse Practitioner

## 2022-09-29 ENCOUNTER — Encounter: Payer: Self-pay | Admitting: Nurse Practitioner

## 2022-09-29 VITALS — BP 114/60 | HR 89 | Ht 64.0 in | Wt 106.0 lb

## 2022-09-29 DIAGNOSIS — E785 Hyperlipidemia, unspecified: Secondary | ICD-10-CM

## 2022-09-29 DIAGNOSIS — Z794 Long term (current) use of insulin: Secondary | ICD-10-CM | POA: Diagnosis not present

## 2022-09-29 DIAGNOSIS — E119 Type 2 diabetes mellitus without complications: Secondary | ICD-10-CM | POA: Diagnosis not present

## 2022-09-29 DIAGNOSIS — Z01810 Encounter for preprocedural cardiovascular examination: Secondary | ICD-10-CM | POA: Diagnosis not present

## 2022-09-29 DIAGNOSIS — I5022 Chronic systolic (congestive) heart failure: Secondary | ICD-10-CM

## 2022-09-29 DIAGNOSIS — I2510 Atherosclerotic heart disease of native coronary artery without angina pectoris: Secondary | ICD-10-CM | POA: Diagnosis not present

## 2022-09-29 NOTE — Patient Instructions (Signed)
Medication Instructions:  Your physician recommends that you continue on your current medications as directed. Please refer to the Current Medication list given to you today. *If you need a refill on your cardiac medications before your next appointment, please call your pharmacy*   Lab Work: None Ordered   Testing/Procedures: None ordered   Follow-Up: At [facility], you and your health needs are our priority.  As part of our continuing mission to provide you with exceptional heart care, we have created designated Provider Care Teams.  These Care Teams include your primary Cardiologist (physician) and Advanced Practice Providers (APPs -  Physician Assistants and Nurse Practitioners) who all work together to provide you with the care you need, when you need it.  We recommend signing up for the patient portal called "MyChart".  Sign up information is provided on this After Visit Summary.  MyChart is used to connect with patients for Virtual Visits (Telemedicine).  Patients are able to view lab/test results, encounter notes, upcoming appointments, etc.  Non-urgent messages can be sent to your provider as well.   To learn more about what you can do with MyChart, go to [email or web address].    Your next appointment:   6 month(s)  Provider:   Candee Furbish, MD     Other Instructions

## 2022-09-29 NOTE — Telephone Encounter (Signed)
Patient has been scheduled for 10/15/2021 for [place].

## 2022-10-05 ENCOUNTER — Other Ambulatory Visit: Payer: Self-pay

## 2022-10-05 ENCOUNTER — Other Ambulatory Visit: Payer: Self-pay | Admitting: Family Medicine

## 2022-10-05 DIAGNOSIS — E1165 Type 2 diabetes mellitus with hyperglycemia: Secondary | ICD-10-CM

## 2022-10-06 ENCOUNTER — Other Ambulatory Visit: Payer: Self-pay

## 2022-10-06 MED ORDER — DEXCOM G7 SENSOR MISC
0 refills | Status: DC
  Filled 2022-10-06: qty 3, 28d supply, fill #0
  Filled 2022-10-06: qty 3, 30d supply, fill #0

## 2022-10-06 NOTE — Telephone Encounter (Signed)
Requested Prescriptions  Pending Prescriptions Disp Refills   Continuous Blood Gluc Sensor (DEXCOM G7 SENSOR) MISC 3 each 0    Sig: Use to check blood sugar 3 times daily.     Endocrinology: Diabetes - Testing Supplies Passed - 10/05/2022  9:06 AM      Passed - Valid encounter within last 12 months    Recent Outpatient Visits           1 month ago Type 2 diabetes mellitus with hyperglycemia, with long-term current use of insulin ([place])   [place], Charlane Ferretti, MD   2 months ago Type 2 diabetes mellitus with hyperglycemia, with long-term current use of insulin [facility])   [place], Alva L, RPH-CPP   3 months ago Type 2 diabetes mellitus with hyperglycemia, with long-term current use of insulin [facility])   [place], Jarome Matin, RPH-CPP   4 months ago Annual physical exam   [place], Charlane Ferretti, MD   5 months ago Type 2 diabetes mellitus with hyperglycemia, with long-term current use of insulin [facility])   [place], RPH-CPP       Future Appointments             In 3 weeks Zuleta, Valora Corporal, NP [place] [facility], [facility]   In 1 month Charlott Rakes, MD [place]   In 6 months Skains, Thana Farr, MD [place] at [facility], [place]

## 2022-10-07 ENCOUNTER — Other Ambulatory Visit: Payer: Self-pay

## 2022-10-08 ENCOUNTER — Encounter (HOSPITAL_COMMUNITY): Payer: Medicaid Other

## 2022-10-08 DIAGNOSIS — M5416 Radiculopathy, lumbar region: Secondary | ICD-10-CM | POA: Diagnosis not present

## 2022-10-14 ENCOUNTER — Other Ambulatory Visit: Payer: Self-pay | Admitting: Family Medicine

## 2022-10-14 ENCOUNTER — Other Ambulatory Visit: Payer: Self-pay

## 2022-10-14 NOTE — Telephone Encounter (Signed)
Requested medication (s) are due for refill today: yes  Requested medication (s) are on the active medication list: yes  Last refill:  10/17/21  Future visit scheduled: yes  Notes to clinic:  Unable to refill per protocol, last refill by another provider.      Requested Prescriptions  Pending Prescriptions Disp Refills   Insulin Pen Needle (PENTIPS) 32G X 4 MM MISC 100 each 0    Sig: Use as directed with insulin pen     Endocrinology: Diabetes - Testing Supplies Passed - 10/14/2022  9:39 AM      Passed - Valid encounter within last 12 months    Recent Outpatient Visits           1 month ago Type 2 diabetes mellitus with hyperglycemia, with long-term current use of insulin ([place])   [place], [place], MD   3 months ago Type 2 diabetes mellitus with hyperglycemia, with long-term current use of insulin [facility])   [place], [place] L, RPH-CPP   4 months ago Type 2 diabetes mellitus with hyperglycemia, with long-term current use of insulin [facility])   [place], Jarome Matin, RPH-CPP   5 months ago Annual physical exam   [place], Charlane Ferretti, MD   5 months ago Type 2 diabetes mellitus with hyperglycemia, with long-term current use of insulin [facility])   [place], RPH-CPP       Future Appointments             In 2 weeks Leonard Schwartz, Valora Corporal, NP [place] [facility] at [facility], [facility]   In 1 month Charlott Rakes, MD [place]   In 5 months Skains, Thana Farr, MD [place] at [facility], [place]

## 2022-10-15 ENCOUNTER — Other Ambulatory Visit: Payer: Self-pay

## 2022-10-15 MED ORDER — PENTIPS 32G X 4 MM MISC
0 refills | Status: DC
  Filled 2022-10-15: qty 100, 34d supply, fill #0

## 2022-10-15 NOTE — Progress Notes (Signed)
Patient seen by Lazarus Gowda, PharmD Candidate on 10/14/2022   while they were picking up prescriptions at [place] [facility].   Blood pressure today was : 153/91, HR 82; on recheck (if >140 or >90): did not have chance to recheck, patient had to run.   Patient has an automated home blood pressure machine. They report home readings 120's/80's   Medication review was performed. They are taking medications as prescribed.  The following barriers to adherence were noted:  - They do not have cost concerns.  - They do not have transportation concerns.  - They do not need assistance obtaining refills.  - They do not occasionally forget to take some of their prescribed medications.  - They do not feel like one/some of their medications make them feel poorly.  - They do not have questions or concerns about their medications.   The following interventions were completed:  - Medications were reviewed  - Patient was educated on proper technique to check home blood pressure and reminded to bring home machine and readings to next provider appointment  - Patient was educated on medications, including indication and administration  - Patient was educated on how to access home blood pressure machine   Lazarus Gowda, PharmD Candidate   Brooke [name], Pharm.D. PGY-2 Ambulatory Care Pharmacy Resident

## 2022-10-16 ENCOUNTER — Ambulatory Visit
Admission: RE | Admit: 2022-10-16 | Discharge: 2022-10-16 | Disposition: A | Discharge status: Home / Self Care | Payer: Medicaid Other | Source: Ambulatory Visit | Attending: Family Medicine | Admitting: Family Medicine

## 2022-10-16 DIAGNOSIS — I771 Stricture of artery: Secondary | ICD-10-CM | POA: Diagnosis not present

## 2022-10-16 DIAGNOSIS — H3500 Unspecified background retinopathy: Secondary | ICD-10-CM

## 2022-10-16 DIAGNOSIS — I6523 Occlusion and stenosis of bilateral carotid arteries: Secondary | ICD-10-CM | POA: Diagnosis not present

## 2022-10-18 ENCOUNTER — Encounter: Payer: Self-pay | Admitting: Certified Registered Nurse Anesthetist

## 2022-10-19 NOTE — Telephone Encounter (Signed)
[name] from [place] is calling to f/u on carotid ultrasound. [name] stated pt is in office now, and if possible and results are available, they are requesting they be sent.  Please advise.

## 2022-10-20 ENCOUNTER — Other Ambulatory Visit: Payer: Self-pay | Admitting: Family Medicine

## 2022-10-20 DIAGNOSIS — I6523 Occlusion and stenosis of bilateral carotid arteries: Secondary | ICD-10-CM

## 2022-10-20 NOTE — Telephone Encounter (Signed)
Results has been faxed over.

## 2022-10-22 ENCOUNTER — Other Ambulatory Visit: Payer: Self-pay

## 2022-10-22 ENCOUNTER — Ambulatory Visit (AMBULATORY_SURGERY_CENTER): Payer: Medicaid Other | Admitting: Gastroenterology

## 2022-10-22 ENCOUNTER — Encounter: Payer: Self-pay | Admitting: Gastroenterology

## 2022-10-22 VITALS — BP 109/73 | HR 72 | Temp 96.9°F | Resp 10 | Ht 63.0 in | Wt 106.0 lb

## 2022-10-22 DIAGNOSIS — K621 Rectal polyp: Secondary | ICD-10-CM | POA: Diagnosis not present

## 2022-10-22 DIAGNOSIS — R1310 Dysphagia, unspecified: Secondary | ICD-10-CM | POA: Diagnosis not present

## 2022-10-22 DIAGNOSIS — I509 Heart failure, unspecified: Secondary | ICD-10-CM | POA: Diagnosis not present

## 2022-10-22 DIAGNOSIS — D122 Benign neoplasm of ascending colon: Secondary | ICD-10-CM

## 2022-10-22 DIAGNOSIS — D120 Benign neoplasm of cecum: Secondary | ICD-10-CM | POA: Diagnosis not present

## 2022-10-22 DIAGNOSIS — Z86010 Personal history of colonic polyps: Secondary | ICD-10-CM

## 2022-10-22 DIAGNOSIS — I10 Essential (primary) hypertension: Secondary | ICD-10-CM | POA: Diagnosis not present

## 2022-10-22 DIAGNOSIS — D128 Benign neoplasm of rectum: Secondary | ICD-10-CM

## 2022-10-22 DIAGNOSIS — E119 Type 2 diabetes mellitus without complications: Secondary | ICD-10-CM | POA: Diagnosis not present

## 2022-10-22 DIAGNOSIS — Z09 Encounter for follow-up examination after completed treatment for conditions other than malignant neoplasm: Secondary | ICD-10-CM

## 2022-10-22 MED ORDER — SODIUM CHLORIDE 0.9 % IV SOLN: 500.0000 mL | Freq: Once | INTRAVENOUS | Status: DC

## 2022-10-22 NOTE — Progress Notes (Signed)
[facility] History and Physical   Primary Care Physician:  Charlott Rakes, MD   Reason for Procedure:   Dysphagia, history of colon polyps  Plan:    EGD with dilation, colonoscopy     HPI: Brooke [name] is a 62 y.o. female  here for EGD to evaluate dysphagia and colonoscopy surveillance for history of polyps. Last exam 04/2015 with an advanced adenoma removed, no follow up since then. Barium swallow shows dysmotility but never had a prior dilation. Off PLavix for 5 days.  .No family history of colon cancer known. Otherwise feels well without any cardiopulmonary symptoms.   I have discussed risks / benefits of anesthesia and endoscopic procedure with Brooke [name] and they wish to proceed with the exams as outlined today.    Past Medical History:  Diagnosis Date   Abdominal pain, left lower quadrant 12/17/2015   Chest pain, unspecified 10/05/2003   CHF (congestive heart failure) (HCC)    Depressive disorder, not elsewhere classified 07/31/2005   Diabetes mellitus without complication ([place]) 123456   Endometriosis 09/14/2012   Hypertension 11/23/2003   Unspecified constipation 06/24/2004    Past Surgical History:  Procedure Laterality Date   APPENDECTOMY  1996   Dr. Lindon Romp   BREAST SURGERY Bilateral 1996   Dr. Haskell Riling HERNIA REPAIR     OVARIAN CYST REMOVAL     RIGHT/LEFT HEART CATH AND CORONARY ANGIOGRAPHY N/A 10/09/2021   Procedure: RIGHT/LEFT HEART CATH AND CORONARY ANGIOGRAPHY;  Surgeon: Early Osmond, MD;  Location: [facility];  Service: Cardiovascular;  Laterality: N/A;   TOOTH EXTRACTION N/A 07/28/2022   Procedure: EXTRACTION ALL REMAING TEETH THREE, FOUR, FIVE, SIX, SEVEN, EIGHT, NINE, TEN, ELEVEN, TWELVE, TWENTY ONE, TWENTY TWO, TWENTY THREE, TWENTY FOUR, TWENTY FIVE, TWENTY SIX, TWENTY SEVEN, TWENTY EIGHT, TWENTY NINE, THIRTY, AVEOLOPLASTY;  Surgeon: Diona Browner, DMD;  Location: [facility];  Service: Oral Surgery;  Laterality: N/A;     Prior to Admission medications   Medication Sig Start Date End Date Taking? Authorizing Provider  acetaminophen (TYLENOL) 500 MG tablet Take 1,000 mg by mouth every 6 (six) hours as needed for mild pain, fever or headache.   Yes [provider]  aspirin 81 MG chewable tablet Chew 1 tablet (81 mg total) by mouth daily. 12/15/21  Yes Bensimhon, Shaune Pascal, MD  atorvastatin (LIPITOR) 80 MG tablet Take 1 tablet (80 mg total) by mouth daily. 07/21/22  Yes Charlott Rakes, MD  dapagliflozin propanediol (FARXIGA) 10 MG TABS tablet Take 1 tablet (10 mg total) by mouth daily. 07/21/22  Yes Charlott Rakes, MD  Insulin Lispro Prot & Lispro (HUMALOG MIX 75/25 KWIKPEN) (75-25) 100 UNIT/ML Kwikpen Inject 22 Units into the skin in the morning AND 16 Units every evening. 08/18/22  Yes Charlott Rakes, MD  Insulin Pen Needle (PENTIPS) 32G X 4 MM MISC Use as directed with insulin pen 10/15/22  Yes Newlin, Charlane Ferretti, MD  metoprolol succinate (TOPROL-XL) 25 MG 24 hr tablet Take 1 tablet (25 mg total) by mouth daily. Take with or immediately following a meal. 03/10/22  Yes Newlin, Enobong, MD  Multiple Vitamin (MULTIVITAMIN WITH MINERALS) TABS tablet Take 1 tablet by mouth daily.   Yes [provider]  sacubitril-valsartan (ENTRESTO) 49-51 MG Take 1 tablet by mouth 2 (two) times daily. 03/10/22  Yes Charlott Rakes, MD  solifenacin (VESICARE) 5 MG tablet Take 1 tablet (5 mg total) by mouth daily. 09/21/22  Yes Jaquita Folds, MD  spironolactone (ALDACTONE) 25 MG tablet Take  1 tablet (25 mg total) by mouth daily. 07/28/22  Yes Charlott Rakes, MD  vitamin B-12 (CYANOCOBALAMIN) 1000 MCG tablet Take 3,000 mcg by mouth daily.   Yes [provider]  clopidogrel (PLAVIX) 75 MG tablet Take 1 tablet (75 mg total) by mouth daily. 08/18/22   Charlott Rakes, MD  Continuous Blood Gluc Receiver (DEXCOM G7 RECEIVER) DEVI Use to check blood sugar 3 times daily. Patient not taking: Reported on 10/22/2022 06/02/22    Charlott Rakes, MD  Continuous Blood Gluc Sensor (DEXCOM G7 SENSOR) MISC Use to check blood sugar 3 times daily. Patient not taking: Reported on 10/22/2022 10/06/22   Charlott Rakes, MD  HYDROcodone-acetaminophen (NORCO) 5-325 MG tablet Take 1 tablet by mouth every 6 (six) hours as needed for moderate pain. Patient not taking: Reported on 10/22/2022 07/28/22   Diona Browner, DMD  omeprazole (PRILOSEC) 40 MG capsule Take 1 capsule (40 mg total) by mouth daily. Patient not taking: Reported on 10/22/2022 04/07/22   Charlott Rakes, MD    Current Outpatient Medications  Medication Sig Dispense Refill   acetaminophen (TYLENOL) 500 MG tablet Take 1,000 mg by mouth every 6 (six) hours as needed for mild pain, fever or headache.     aspirin 81 MG chewable tablet Chew 1 tablet (81 mg total) by mouth daily. 30 tablet 1   atorvastatin (LIPITOR) 80 MG tablet Take 1 tablet (80 mg total) by mouth daily. 30 tablet 3   dapagliflozin propanediol (FARXIGA) 10 MG TABS tablet Take 1 tablet (10 mg total) by mouth daily. 30 tablet 3   Insulin Lispro Prot & Lispro (HUMALOG MIX 75/25 KWIKPEN) (75-25) 100 UNIT/ML Kwikpen Inject 22 Units into the skin in the morning AND 16 Units every evening. 15 mL 2   Insulin Pen Needle (PENTIPS) 32G X 4 MM MISC Use as directed with insulin pen 100 each 0   metoprolol succinate (TOPROL-XL) 25 MG 24 hr tablet Take 1 tablet (25 mg total) by mouth daily. Take with or immediately following a meal. 90 tablet 3   Multiple Vitamin (MULTIVITAMIN WITH MINERALS) TABS tablet Take 1 tablet by mouth daily.     sacubitril-valsartan (ENTRESTO) 49-51 MG Take 1 tablet by mouth 2 (two) times daily. 180 tablet 3   solifenacin (VESICARE) 5 MG tablet Take 1 tablet (5 mg total) by mouth daily. 30 tablet 5   spironolactone (ALDACTONE) 25 MG tablet Take 1 tablet (25 mg total) by mouth daily. 30 tablet 2   vitamin B-12 (CYANOCOBALAMIN) 1000 MCG tablet Take 3,000 mcg by mouth daily.     clopidogrel (PLAVIX) 75  MG tablet Take 1 tablet (75 mg total) by mouth daily. 30 tablet 3   Continuous Blood Gluc Receiver (DEXCOM G7 RECEIVER) DEVI Use to check blood sugar 3 times daily. (Patient not taking: Reported on 10/22/2022) 1 each 0   Continuous Blood Gluc Sensor (DEXCOM G7 SENSOR) MISC Use to check blood sugar 3 times daily. (Patient not taking: Reported on 10/22/2022) 3 each 0   HYDROcodone-acetaminophen (NORCO) 5-325 MG tablet Take 1 tablet by mouth every 6 (six) hours as needed for moderate pain. (Patient not taking: Reported on 10/22/2022) 24 tablet 0   omeprazole (PRILOSEC) 40 MG capsule Take 1 capsule (40 mg total) by mouth daily. (Patient not taking: Reported on 10/22/2022) 30 capsule 1   Current Facility-Administered Medications  Medication Dose Route Frequency Provider Last Rate Last Admin   0.9 %  sodium chloride infusion  500 mL Intravenous Once Nivin Braniff, Carlota Raspberry, MD  Allergies as of 10/22/2022 - Review Complete 10/22/2022  Allergen Reaction Noted   Aspirin  11/08/2011   Darvocet [propoxyphene n-acetaminophen]  11/08/2011   Erythromycin Nausea And Vomiting 03/28/2013   Gabapentin Other (See Comments) 11/07/2020   Ibuprofen  03/28/2013   Metformin and related Nausea Only 07/01/2016    Family History  Problem Relation Age of Onset   Diabetes Mother    Hypertension Mother    Arthritis Mother        RA   Pancreatic cancer Father    Stomach cancer Neg Hx    Colon cancer Neg Hx    Esophageal cancer Neg Hx    Rectal cancer Neg Hx     Social History   Socioeconomic History   Marital status: Married    Spouse name: Not on file   Number of children: 1   Years of education: Not on file   Highest education level: Not on file  Occupational History   Not on file  Tobacco Use   Smoking status: Never   Smokeless tobacco: Never  Vaping Use   Vaping Use: Never used  Substance and Sexual Activity   Alcohol use: No   Drug use: No   Sexual activity: Not Currently  Other Topics  Concern   Not on file  Social History Narrative   Not on file   Social Determinants of Health   Financial Resource Strain: Medium Risk (10/31/2021)   Overall Financial Resource Strain (CARDIA)    Difficulty of Paying Living Expenses: Somewhat hard  Food Insecurity: No Food Insecurity (10/31/2021)   Hunger Vital Sign    Worried About Running Out of Food in the Last Year: Never true    Ran Out of Food in the Last Year: Never true  Transportation Needs: No Transportation Needs (10/31/2021)   PRAPARE - Hydrologist (Medical): No    Lack of Transportation (Non-Medical): No  Physical Activity: Not on file  Stress: Not on file  Social Connections: Not on file  Intimate Partner Violence: Not on file    Review of Systems: All other review of systems negative except as mentioned in the HPI.  Physical Exam: Vital signs BP 125/77   Pulse 60   Temp (!) 96.9 F (36.1 C)   Ht '5\' 3"'$  (1.6 m)   Wt 106 lb (48.1 kg)   LMP 11/16/2014 (Approximate)   SpO2 98%   BMI 18.78 kg/m   General:   Alert,  Well-developed, pleasant and cooperative in NAD Lungs:  Clear throughout to auscultation.   Heart:  Regular rate and rhythm Abdomen:  Soft, nontender and nondistended.   Neuro/Psych:  Alert and cooperative. Normal mood and affect. A and O x 3  Jolly Mango, MD [facility]

## 2022-10-22 NOTE — Op Note (Addendum)
[facility] Patient Name: Nacona Deel Procedure Date: 10/22/2022 2:29 PM MRN: [id number] Endoscopist: Remo Lipps P. Havery Moros , MD, [id number] Age: 62 Referring MD:  Date of Birth: August 24, 1960 Gender: Female Account #: [id number] Procedure:                Colonoscopy Indications:              High risk colon cancer surveillance: Personal                            history of colonic polyps - advanced adenoma                            removed on last exam 04/2015 Medicines:                Monitored Anesthesia Care Procedure:                Pre-Anesthesia Assessment:                           - Prior to the procedure, a History and Physical                            was performed, and patient medications and                            allergies were reviewed. The patient's tolerance of                            previous anesthesia was also reviewed. The risks                            and benefits of the procedure and the sedation                            options and risks were discussed with the patient.                            All questions were answered, and informed consent                            was obtained. Prior Anticoagulants: The patient has                            taken Plavix (clopidogrel), last dose was 5 days                            prior to procedure. ASA Grade Assessment: III - A                            patient with severe systemic disease. After                            reviewing the risks and benefits, the patient was  deemed in satisfactory condition to undergo the                            procedure.                           After obtaining informed consent, the colonoscope                            was passed under direct vision. Throughout the                            procedure, the patient's blood pressure, pulse, and                            oxygen saturations were monitored continuously. The                             [id number]-[id number] Slim SN [id number] was introduced through                            the anus and advanced to the the cecum, identified                            by appendiceal orifice and ileocecal valve. The                            colonoscopy was performed without difficulty. The                            patient tolerated the procedure well. The quality                            of the bowel preparation was adequate. The                            ileocecal valve, appendiceal orifice, and rectum                            were photographed. Scope In: [id number] PM Scope Out: 3:05:56 PM Scope Withdrawal Time: 0 hours 12 minutes 1 second  Total Procedure Duration: 0 hours 19 minutes 37 seconds  Findings:                 The perianal and digital rectal examinations were                            normal.                           A diminutive polyp was found in the cecum. The                            polyp was sessile. The polyp was removed with a  cold snare. Resection and retrieval were complete.                           A 3 mm polyp was found in the ascending colon. The                            polyp was sessile. The polyp was removed with a                            cold snare. Resection and retrieval were complete.                           The colon was tortuous - ultraslim pediatric                            colonsocope used for this exam.                           Multiple small-mouthed diverticula were found in                            the sigmoid colon.                           A 3 mm polyp was found in the rectum. The polyp was                            sessile. The polyp was removed with a cold snare.                            Resection and retrieval were complete.                           Internal hemorrhoids were found during retroflexion.                           The exam was otherwise without  abnormality. Complications:            No immediate complications. Estimated blood loss:                            Minimal. Estimated Blood Loss:     Estimated blood loss was minimal. Impression:               - One diminutive polyp in the cecum, removed with a                            cold snare. Resected and retrieved.                           - One 3 mm polyp in the ascending colon, removed                            with a cold snare. Resected and retrieved.                           -  Tortuous colon.                           - Diverticulosis in the sigmoid colon.                           - One 3 mm polyp in the rectum, removed with a cold                            snare. Resected and retrieved.                           - Internal hemorrhoids.                           - The examination was otherwise normal. Recommendation:           - Patient has a contact number available for                            emergencies. The signs and symptoms of potential                            delayed complications were discussed with the                            patient. Return to normal activities tomorrow.                            Written discharge instructions were provided to the                            patient.                           - Resume previous diet.                           - Continue present medications.                           - Resume Plavix tomorrow                           - Await pathology results. Remo Lipps P. Faiza Bansal, MD 10/22/2022 3:10:56 PM This report has been signed electronically.

## 2022-10-22 NOTE — Op Note (Addendum)
[facility] Patient Name: Brooke [name] Procedure Date: 10/22/2022 2:30 PM MRN: [id number] Endoscopist: Remo Lipps P. Havery Moros , MD, [id number] Age: 62 Referring MD:  Date of Birth: 06/13/1961 Gender: Female Account #: [id number] Procedure:                Upper GI endoscopy Indications:              Dysphagia - barium study suggests mild dysmotility,                            she localizes symptoms to her posterior pharynx. No                            prior dilation Medicines:                Monitored Anesthesia Care Procedure:                Pre-Anesthesia Assessment:                           - Prior to the procedure, a History and Physical                            was performed, and patient medications and                            allergies were reviewed. The patient's tolerance of                            previous anesthesia was also reviewed. The risks                            and benefits of the procedure and the sedation                            options and risks were discussed with the patient.                            All questions were answered, and informed consent                            was obtained. Prior Anticoagulants: The patient has                            taken Plavix (clopidogrel), last dose was 5 days                            prior to procedure. ASA Grade Assessment: III - A                            patient with severe systemic disease. After                            reviewing the risks and benefits, the patient was  deemed in satisfactory condition to undergo the                            procedure.                           After obtaining informed consent, the endoscope was                            passed under direct vision. Throughout the                            procedure, the patient's blood pressure, pulse, and                            oxygen saturations were monitored continuously. The                             GIF [id number] was introduced through the                            mouth, and advanced to the second part of duodenum.                            The upper GI endoscopy was accomplished without                            difficulty. The patient tolerated the procedure                            well. Scope In: Scope Out: Findings:                 The Z-line was regular.                           The exam of the esophagus was otherwise normal.                           A guidewire was placed and the scope was withdrawn.                            Empiric dilation was performed in the entire                            esophagus with a Savary dilator with mild                            resistance at 16 mm and 17 mm. Relook endoscopy                            showed no mucosal wrents.                           The entire examined stomach was normal.  The examined duodenum was normal. Complications:            No immediate complications. Estimated blood loss:                            Minimal. Estimated Blood Loss:     Estimated blood loss was minimal. Impression:               - Z-line regular.                           - Normal esophagus - empiric dilation performed to                            3m.                           - Normal stomach.                           - Normal examined duodenum. Recommendation:           - Patient has a contact number available for                            emergencies. The signs and symptoms of potential                            delayed complications were discussed with the                            patient. Return to normal activities tomorrow.                            Written discharge instructions were provided to the                            patient.                           - Resume previous diet.                           - Continue present medications.                            - Resume Plavix tomorrow (polypectomy today during                            colonoscopy)                           - Await course post dilation - if no benefit, would                            argue dysmotility is the likely cause of her  symptoms Carlota Raspberry. Zaki Gertsch, MD 10/22/2022 3:14:04 PM This report has been signed electronically.

## 2022-10-22 NOTE — Patient Instructions (Signed)
You may resume your plavix tomorrow.  You may resume all of your current medicines today.  Read all of the handouts given to you by your recover room nurse.  YOU HAD AN ENDOSCOPIC PROCEDURE TODAY AT [facility]:   Refer to the procedure report that was given to you for any specific questions about what was found during the examination.  If the procedure report does not answer your questions, please call your gastroenterologist to clarify.  If you requested that your care partner not be given the details of your procedure findings, then the procedure report has been included in a sealed envelope for you to review at your convenience later.  YOU SHOULD EXPECT: Some feelings of bloating in the abdomen. Passage of more gas than usual.  Walking can help get rid of the air that was put into your GI tract during the procedure and reduce the bloating. If you had a lower endoscopy (such as a colonoscopy or flexible sigmoidoscopy) you may notice spotting of blood in your stool or on the toilet paper. If you underwent a bowel prep for your procedure, you may not have a normal bowel movement for a few days.  Please Note:  You might notice some irritation and congestion in your nose or some drainage.  This is from the oxygen used during your procedure.  There is no need for concern and it should clear up in a day or so.  SYMPTOMS TO REPORT IMMEDIATELY:  Following lower endoscopy (colonoscopy or flexible sigmoidoscopy):  Excessive amounts of blood in the stool  Significant tenderness or worsening of abdominal pains  Swelling of the abdomen that is new, acute  Fever of 100F or higher  Following upper endoscopy (EGD)  Vomiting of blood or coffee ground material  New chest pain or pain under the shoulder blades  Painful or persistently difficult swallowing  New shortness of breath  Fever of 100F or higher  Black, tarry-looking stools  For urgent or emergent issues, a gastroenterologist can  be reached at any hour by calling [phone]. Do not use MyChart messaging for urgent concerns.    DIET:  We do recommend a small meal at first, but then you may proceed to your regular diet.  Drink plenty of fluids but you should avoid alcoholic beverages for 24 hours.  You may resume your regular diet today per Dr. Havery Moros.  ACTIVITY:  You should plan to take it easy for the rest of today and you should NOT DRIVE or use heavy machinery until tomorrow (because of the sedation medicines used during the test).    FOLLOW UP: Our staff will call the number listed on your records the next business day following your procedure.  We will call around 7:15- 8:00 am to check on you and address any questions or concerns that you may have regarding the information given to you following your procedure. If we do not reach you, we will leave a message.     If any biopsies were taken you will be contacted by phone or by letter within the next 1-3 weeks.  Please call us at [phone] if you have not heard about the biopsies in 3 weeks.    SIGNATURES/CONFIDENTIALITY: You and/or your care partner have signed paperwork which will be entered into your electronic medical record.  These signatures attest to the fact that that the information above on your After Visit Summary has been reviewed and is understood.  Full responsibility of the  confidentiality of this discharge information lies with you and/or your care-partner.

## 2022-10-22 NOTE — Progress Notes (Signed)
Called to room to assist during endoscopic procedure.  Patient ID and intended procedure confirmed with present staff. Received instructions for my participation in the procedure from the performing physician.  

## 2022-10-22 NOTE — Progress Notes (Signed)
A and O x3. Report to RN. Tolerated MAC anesthesia well.Teeth unchanged after procedure. 

## 2022-10-23 ENCOUNTER — Other Ambulatory Visit: Payer: Self-pay

## 2022-10-23 ENCOUNTER — Telehealth: Payer: Self-pay | Admitting: *Deleted

## 2022-10-23 NOTE — Telephone Encounter (Signed)
  Follow up Call-     10/22/2022    1:44 PM 12/11/2020    9:07 AM  Call back number  Post procedure Call Back phone  # [phone]  Permission to leave phone message Yes Yes     Patient questions:  Do you have a fever, pain , or abdominal swelling? No. Pain Score  0 *  Have you tolerated food without any problems? Yes.    Have you been able to return to your normal activities? Yes.    Do you have any questions about your discharge instructions: Diet   No. Medications  No. Follow up visit  No.  Do you have questions or concerns about your Care? No.  Actions: * If pain score is 4 or above: No action needed, pain <4.

## 2022-10-27 ENCOUNTER — Ambulatory Visit: Payer: Medicaid Other | Admitting: Obstetrics and Gynecology

## 2022-10-27 ENCOUNTER — Encounter: Payer: Self-pay | Admitting: Vascular Surgery

## 2022-10-27 ENCOUNTER — Ambulatory Visit: Payer: Medicaid Other | Admitting: Vascular Surgery

## 2022-10-27 ENCOUNTER — Encounter: Payer: Self-pay | Admitting: Gastroenterology

## 2022-10-27 VITALS — BP 137/81 | HR 83 | Temp 97.7°F | Resp 14 | Ht 64.0 in | Wt 104.0 lb

## 2022-10-27 DIAGNOSIS — I6523 Occlusion and stenosis of bilateral carotid arteries: Secondary | ICD-10-CM

## 2022-10-27 DIAGNOSIS — I779 Disorder of arteries and arterioles, unspecified: Secondary | ICD-10-CM

## 2022-10-27 HISTORY — DX: Disorder of arteries and arterioles, unspecified: I77.9

## 2022-10-27 NOTE — Progress Notes (Signed)
Patient name: Brooke [name] MRN: [id number] DOB: August 17, 1960 Sex: female  REASON FOR CONSULT: Evaluate carotid artery disease  HPI: Brooke [name] is a 62 y.o. female, with hx HTN, CAD, CHF that presents for evaluation of carotid artery disease.  Patient states she was told by an ophthalmologist ([facility]) that she may have some decreased flow in the arteries in her neck.  She denies any history of strokes or TIAs.  No previous neck surgery.  Is getting back injections for chronic sciatica of her left leg.  She had a carotid ultrasound on 10/16/2022 with bilateral ICA stenosis less than 50%.  She had antegrade flow in both vertebral arteries.  She is on aspirin plavix and statin.  Past Medical History:  Diagnosis Date   Abdominal pain, left lower quadrant 12/17/2015   Chest pain, unspecified 10/05/2003   CHF (congestive heart failure) (HCC)    Depressive disorder, not elsewhere classified 07/31/2005   Diabetes mellitus without complication ([place]) 123456   Endometriosis 09/14/2012   Hypertension 11/23/2003   Unspecified constipation 06/24/2004    Past Surgical History:  Procedure Laterality Date   APPENDECTOMY  1996   Dr. Lindon Romp   BREAST SURGERY Bilateral 1996   Dr. Haskell Riling HERNIA REPAIR     OVARIAN CYST REMOVAL     RIGHT/LEFT HEART CATH AND CORONARY ANGIOGRAPHY N/A 10/09/2021   Procedure: RIGHT/LEFT HEART CATH AND CORONARY ANGIOGRAPHY;  Surgeon: Early Osmond, MD;  Location: [facility];  Service: Cardiovascular;  Laterality: N/A;   TOOTH EXTRACTION N/A 07/28/2022   Procedure: EXTRACTION ALL REMAING TEETH THREE, FOUR, FIVE, SIX, SEVEN, EIGHT, NINE, TEN, ELEVEN, TWELVE, TWENTY ONE, TWENTY TWO, TWENTY THREE, TWENTY FOUR, TWENTY FIVE, TWENTY SIX, TWENTY SEVEN, TWENTY EIGHT, TWENTY NINE, THIRTY, AVEOLOPLASTY;  Surgeon: Diona Browner, DMD;  Location: [facility];  Service: Oral Surgery;  Laterality: N/A;    Family History  Problem Relation Age of Onset    Diabetes Mother    Hypertension Mother    Arthritis Mother        RA   Pancreatic cancer Father    Stomach cancer Neg Hx    Colon cancer Neg Hx    Esophageal cancer Neg Hx    Rectal cancer Neg Hx     SOCIAL HISTORY: Social History   Socioeconomic History   Marital status: Married    Spouse name: Not on file   Number of children: 1   Years of education: Not on file   Highest education level: Not on file  Occupational History   Not on file  Tobacco Use   Smoking status: Never   Smokeless tobacco: Never  Vaping Use   Vaping Use: Never used  Substance and Sexual Activity   Alcohol use: No   Drug use: No   Sexual activity: Not Currently  Other Topics Concern   Not on file  Social History Narrative   Not on file   Social Determinants of Health   Financial Resource Strain: Medium Risk (10/31/2021)   Overall Financial Resource Strain (CARDIA)    Difficulty of Paying Living Expenses: Somewhat hard  Food Insecurity: No Food Insecurity (10/31/2021)   Hunger Vital Sign    Worried About Running Out of Food in the Last Year: Never true    Ran Out of Food in the Last Year: Never true  Transportation Needs: No Transportation Needs (10/31/2021)   PRAPARE - Transportation    Lack of Transportation (Medical): No    Lack of  Transportation (Non-Medical): No  Physical Activity: Not on file  Stress: Not on file  Social Connections: Not on file  Intimate Partner Violence: Not on file    Allergies  Allergen Reactions   Aspirin     Intolerance (aspirin sensitive stomach)   Darvocet [Propoxyphene N-Acetaminophen]     Skin reaction on Vaginal area   Erythromycin Nausea And Vomiting   Gabapentin Other (See Comments)    Suicidal ideations    Ibuprofen     Stomach issues   Metformin And Related Nausea Only    Current Outpatient Medications  Medication Sig Dispense Refill   acetaminophen (TYLENOL) 500 MG tablet Take 1,000 mg by mouth every 6 (six) hours as needed for mild pain,  fever or headache.     aspirin 81 MG chewable tablet Chew 1 tablet (81 mg total) by mouth daily. 30 tablet 1   atorvastatin (LIPITOR) 80 MG tablet Take 1 tablet (80 mg total) by mouth daily. 30 tablet 3   clopidogrel (PLAVIX) 75 MG tablet Take 1 tablet (75 mg total) by mouth daily. 30 tablet 3   Continuous Blood Gluc Receiver (DEXCOM G7 RECEIVER) DEVI Use to check blood sugar 3 times daily. (Patient not taking: Reported on 10/22/2022) 1 each 0   Continuous Blood Gluc Sensor (DEXCOM G7 SENSOR) MISC Use to check blood sugar 3 times daily. (Patient not taking: Reported on 10/22/2022) 3 each 0   dapagliflozin propanediol (FARXIGA) 10 MG TABS tablet Take 1 tablet (10 mg total) by mouth daily. 30 tablet 3   HYDROcodone-acetaminophen (NORCO) 5-325 MG tablet Take 1 tablet by mouth every 6 (six) hours as needed for moderate pain. (Patient not taking: Reported on 10/22/2022) 24 tablet 0   Insulin Lispro Prot & Lispro (HUMALOG MIX 75/25 KWIKPEN) (75-25) 100 UNIT/ML Kwikpen Inject 22 Units into the skin in the morning AND 16 Units every evening. 15 mL 2   Insulin Pen Needle (PENTIPS) 32G X 4 MM MISC Use as directed with insulin pen 100 each 0   metoprolol succinate (TOPROL-XL) 25 MG 24 hr tablet Take 1 tablet (25 mg total) by mouth daily. Take with or immediately following a meal. 90 tablet 3   Multiple Vitamin (MULTIVITAMIN WITH MINERALS) TABS tablet Take 1 tablet by mouth daily.     omeprazole (PRILOSEC) 40 MG capsule Take 1 capsule (40 mg total) by mouth daily. (Patient not taking: Reported on 10/22/2022) 30 capsule 1   sacubitril-valsartan (ENTRESTO) 49-51 MG Take 1 tablet by mouth 2 (two) times daily. 180 tablet 3   solifenacin (VESICARE) 5 MG tablet Take 1 tablet (5 mg total) by mouth daily. 30 tablet 5   spironolactone (ALDACTONE) 25 MG tablet Take 1 tablet (25 mg total) by mouth daily. 30 tablet 2   vitamin B-12 (CYANOCOBALAMIN) 1000 MCG tablet Take 3,000 mcg by mouth daily.     No current  facility-administered medications for this visit.    REVIEW OF SYSTEMS:  [X]  denotes positive finding, [ ]  denotes negative finding Cardiac  Comments:  Chest pain or chest pressure:    Shortness of breath upon exertion:    Short of breath when lying flat:    Irregular heart rhythm:        Vascular    Pain in calf, thigh, or hip brought on by ambulation:    Pain in feet at night that wakes you up from your sleep:     Blood clot in your veins:    Leg swelling:  Pulmonary    Oxygen at home:    Productive cough:     Wheezing:         Neurologic    Sudden weakness in arms or legs:     Sudden numbness in arms or legs:     Sudden onset of difficulty speaking or slurred speech:    Temporary loss of vision in one eye:     Problems with dizziness:         Gastrointestinal    Blood in stool:     Vomited blood:         Genitourinary    Burning when urinating:     Blood in urine:        Psychiatric    Major depression:         Hematologic    Bleeding problems:    Problems with blood clotting too easily:        Skin    Rashes or ulcers:        Constitutional    Fever or chills:      PHYSICAL EXAM: There were no vitals filed for this visit.  GENERAL: The patient is a well-nourished female, in no acute distress. The vital signs are documented above. CARDIAC: There is a regular rate and rhythm.  VASCULAR:  Bilateral radial pulses palpable Bilateral femoral pulses palpable No palpable pedal pulses, no tissue loss PULMONARY: No respiratory distress. ABDOMEN: Soft and non-tender. MUSCULOSKELETAL: There are no major deformities or cyanosis. NEUROLOGIC: No focal weakness or paresthesias are detected. SKIN: There are no ulcers or rashes noted. PSYCHIATRIC: The patient has a normal affect.  DATA:   Carotid duplex 10/16/2022 with less than 50% stenosis in bilateral ICAs and antegrade flow in both vertebral arteries.  Assessment/Plan:  62 year old female with hx  HTN, CAD, CHF that presents for evaluation of her carotid artery disease.  She denies any recent history of strokes or TIAs.  Her carotid duplex 10/16/22 showed less than 50% bilateral ICA stenosis.  I discussed for asymptomatic carotid disease we recommend surgical intervention for greater than 80% stenosis.  She does not need any surgical intervention on her carotid arteries at this time.  She is on aspirin and Plavix and statin for appropriate stroke risk reduction.  I will see her again in 1 year with carotid duplex for ongoing surveillance.  Discussed she let me know if she has any concerns or changes in her condition.   Marty Heck, MD [facility] Office: [phone]

## 2022-10-30 NOTE — Progress Notes (Unsigned)
[facility] Return Visit  SUBJECTIVE  History of Present Illness: Neila Holts is a 62 y.o. female seen in follow-up for OAB, levator spasm, and accidental bowel leakage. Plan at last visit was start Vesicare 5mg  daily, start psyllium, and considering pelvic floor injections.   She reports she feels like she has had some minimal symptomatic relief of her urgency but feels like her pelvic floor pain is significantly worse.    Past Medical History: Patient  has a past medical history of Abdominal pain, left lower quadrant (12/17/2015), Chest pain, unspecified (10/05/2003), CHF (congestive heart failure) ([place]), Depressive disorder, not elsewhere classified (07/31/2005), Diabetes mellitus without complication ([place]) (123456), Endometriosis (09/14/2012), Hypertension (11/23/2003), and Unspecified constipation (06/24/2004).   Past Surgical History: She  has a past surgical history that includes Inguinal hernia repair; Ovarian cyst removal; Breast surgery (Bilateral, 1996); Appendectomy (1996); RIGHT/LEFT HEART CATH AND CORONARY ANGIOGRAPHY (N/A, 10/09/2021); and Tooth Extraction (N/A, 07/28/2022).   Medications: She has a current medication list which includes the following prescription(s): diazepam, solifenacin, acetaminophen, aspirin, atorvastatin, clopidogrel, dexcom g7 receiver, dexcom g7 sensor, dapagliflozin propanediol, hydrocodone-acetaminophen, insulin lispro prot & lispro, pentips, metoprolol succinate, multivitamin with minerals, omeprazole, sacubitril-valsartan, spironolactone, and cyanocobalamin.   Allergies: Patient is allergic to aspirin, darvocet [propoxyphene n-acetaminophen], erythromycin, gabapentin, ibuprofen, and metformin and related.   Social History: Patient  reports that she has never smoked. She has never used smokeless tobacco. She reports that she does not drink alcohol and does not use drugs.      OBJECTIVE     Physical Exam: Vitals:   11/02/22  1326  BP: 129/76  Pulse: 82   Gen: No apparent distress, A&O x 3.  Detailed Urogynecologic Evaluation:  Deferred. Prior exam showed:      No data to display             ASSESSMENT AND PLAN    Ms. Marucci is a 62 y.o. with:  1. Urge incontinence   2. Levator spasm   3. Incontinence of feces, unspecified fecal incontinence type   4. Vaginal atrophy    Vesicare increased to 10mg  daily. Patient to double her 5mg  dose until she runs out and new prescription sent to pharmacy.  Patient has done PT in the past and reports she has been trying to do some movements at home and her husband helps do some of the massage but reports the pain has significantly increased. Reports she had a shot in her back but it did not improve her pelvic floor pain. Will prescribe vaginal valium which she can use PRN. If this is not helpful she would like to pursue pelvic floor injections.  Had a recent colonoscopy and reports they found some polyps and also had an endoscopy and they had to stretch her esophagus.  Reports she uses [place] as needed.   Patient to follow up in 6 weeks or sooner if needed.

## 2022-11-02 ENCOUNTER — Encounter: Payer: Self-pay | Admitting: Obstetrics and Gynecology

## 2022-11-02 ENCOUNTER — Ambulatory Visit: Payer: Medicaid Other | Admitting: Obstetrics and Gynecology

## 2022-11-02 ENCOUNTER — Other Ambulatory Visit: Payer: Self-pay

## 2022-11-02 VITALS — BP 129/76 | HR 82

## 2022-11-02 DIAGNOSIS — M62838 Other muscle spasm: Secondary | ICD-10-CM | POA: Diagnosis not present

## 2022-11-02 DIAGNOSIS — N952 Postmenopausal atrophic vaginitis: Secondary | ICD-10-CM

## 2022-11-02 DIAGNOSIS — N3941 Urge incontinence: Secondary | ICD-10-CM | POA: Diagnosis not present

## 2022-11-02 DIAGNOSIS — R159 Full incontinence of feces: Secondary | ICD-10-CM | POA: Diagnosis not present

## 2022-11-02 MED ORDER — DIAZEPAM 5 MG PO TABS
ORAL_TABLET | ORAL | 0 refills | Status: DC
  Filled 2022-11-02: qty 15, 15d supply, fill #0

## 2022-11-02 MED ORDER — SOLIFENACIN SUCCINATE 10 MG PO TABS
10.0000 mg | ORAL_TABLET | Freq: Every day | ORAL | 5 refills | Status: DC
  Filled 2022-11-02: qty 30, 30d supply, fill #0

## 2022-11-02 NOTE — Patient Instructions (Signed)
Start vaginal valium as needed during the weeks.   Increase Vesicare to 10mg  daily.Can double up on the Vesicare 5mg  until complete.

## 2022-11-03 ENCOUNTER — Other Ambulatory Visit: Payer: Self-pay | Admitting: Family Medicine

## 2022-11-03 ENCOUNTER — Other Ambulatory Visit: Payer: Self-pay

## 2022-11-03 MED ORDER — SPIRONOLACTONE 25 MG PO TABS
25.0000 mg | ORAL_TABLET | Freq: Every day | ORAL | 0 refills | Status: DC
  Filled 2022-11-03: qty 90, 90d supply, fill #0

## 2022-11-04 ENCOUNTER — Other Ambulatory Visit: Payer: Self-pay

## 2022-11-17 ENCOUNTER — Ambulatory Visit: Payer: Medicaid Other | Attending: Family Medicine | Admitting: Family Medicine

## 2022-11-17 ENCOUNTER — Other Ambulatory Visit: Payer: Self-pay

## 2022-11-17 ENCOUNTER — Encounter: Payer: Self-pay | Admitting: Family Medicine

## 2022-11-17 VITALS — BP 121/63 | HR 77 | Temp 97.8°F | Ht 64.0 in | Wt 106.0 lb

## 2022-11-17 DIAGNOSIS — Z794 Long term (current) use of insulin: Secondary | ICD-10-CM

## 2022-11-17 DIAGNOSIS — I6523 Occlusion and stenosis of bilateral carotid arteries: Secondary | ICD-10-CM

## 2022-11-17 DIAGNOSIS — R519 Headache, unspecified: Secondary | ICD-10-CM

## 2022-11-17 DIAGNOSIS — E1165 Type 2 diabetes mellitus with hyperglycemia: Secondary | ICD-10-CM | POA: Diagnosis not present

## 2022-11-17 DIAGNOSIS — G8929 Other chronic pain: Secondary | ICD-10-CM | POA: Diagnosis not present

## 2022-11-17 DIAGNOSIS — M5416 Radiculopathy, lumbar region: Secondary | ICD-10-CM

## 2022-11-17 DIAGNOSIS — R29898 Other symptoms and signs involving the musculoskeletal system: Secondary | ICD-10-CM

## 2022-11-17 DIAGNOSIS — I110 Hypertensive heart disease with heart failure: Secondary | ICD-10-CM

## 2022-11-17 DIAGNOSIS — I5042 Chronic combined systolic (congestive) and diastolic (congestive) heart failure: Secondary | ICD-10-CM

## 2022-11-17 LAB — POCT GLYCOSYLATED HEMOGLOBIN (HGB A1C): Hemoglobin A1C: 10.2 % — AB (ref 4.0–5.6)

## 2022-11-17 LAB — GLUCOSE, POCT (MANUAL RESULT ENTRY): POC Glucose: 143 mg/dl — AB (ref 70–99)

## 2022-11-17 MED ORDER — ATORVASTATIN CALCIUM 80 MG PO TABS
80.0000 mg | ORAL_TABLET | Freq: Every day | ORAL | 1 refills | Status: DC
Start: 2022-11-17 — End: 2023-06-11
  Filled 2022-11-17: qty 90, 90d supply, fill #0
  Filled 2023-02-23: qty 90, 90d supply, fill #1

## 2022-11-17 MED ORDER — SPIRONOLACTONE 25 MG PO TABS
25.0000 mg | ORAL_TABLET | Freq: Every day | ORAL | 1 refills | Status: DC
Start: 2022-11-17 — End: 2024-05-12
  Filled 2022-11-17: qty 90, 90d supply, fill #0
  Filled 2023-02-18: qty 15, 15d supply, fill #0
  Filled 2023-02-18: qty 75, 75d supply, fill #0
  Filled 2023-06-11: qty 90, 90d supply, fill #1

## 2022-11-17 MED ORDER — INSULIN LISPRO PROT & LISPRO (75-25 MIX) 100 UNIT/ML KWIKPEN
PEN_INJECTOR | SUBCUTANEOUS | 2 refills | Status: DC
Start: 2022-11-17 — End: 2023-02-23
  Filled 2022-11-17 – 2022-12-29 (×2): qty 30, 68d supply, fill #0

## 2022-11-17 MED ORDER — RIZATRIPTAN BENZOATE 10 MG PO TABS
10.0000 mg | ORAL_TABLET | Freq: Once | ORAL | 1 refills | Status: DC | PRN
  Filled 2022-11-17: qty 10, 30d supply, fill #0

## 2022-11-17 MED ORDER — FLUTICASONE PROPIONATE 50 MCG/ACT NA SUSP
2.0000 | Freq: Every day | NASAL | 1 refills | Status: DC
  Filled 2022-11-17: qty 16, 30d supply, fill #0

## 2022-11-17 MED ORDER — PENTIPS 32G X 4 MM MISC
0 refills | Status: DC
  Filled 2022-11-17: qty 100, 34d supply, fill #0
  Filled 2023-03-01: qty 100, 30d supply, fill #0

## 2022-11-17 NOTE — Progress Notes (Signed)
Subjective:  Patient ID: Brooke [name], female    DOB: 1960-10-04  Age: 62 y.o. MRN: [id number]  CC: Diabetes   HPI Brooke [name] is a 62 y.o. year old female with a history of type 2 diabetes mellitus (A1c 10.2), HFmEF (EF 55 to 60% on echo of 04/2022 improved from 20 to 25%), CAD, hypertension, anxiety and depression, protein calorie malnutrition   Interval History:  She had seen [facility] for right hand weakness. She was told 'her right elbow was weak' and this was casing her right hand to be weak  A1c is 10.2 up from 8.9 previously.  I have reviewed her CGM and her phone says not enough data to review 7 day log but the last day or so her sugars have fluctuated with time in range about 40% She has no neuropathy but has left leg weakness intermittently.She receives ESI from her [facility] Denies presence of blurry vision and is up to date on eye exams with [place] [facility]. Her lowest blood glucose was 62 on one occasion and she had a piece of candy which raised her sugar.  She has headaches which starts in the occiput , pulsating and radiates around to her ears and anterior head.  Denies presence of facial pressure, postnasal drip but sometimes states the ears feel clogged.  Symptoms have been present for the last 2 to 3 weeks.  She denies presence of photophobia, phonophobia, nausea or vomiting.  Her sleep pattern has not changed but she states she wakes up a lot at night due to the fact that her husband does not sleep through the night. She has no chest pains but does have some mild dyspnea on exertion. Past Medical History:  Diagnosis Date   Abdominal pain, left lower quadrant 12/17/2015   Chest pain, unspecified 10/05/2003   CHF (congestive heart failure)    Depressive disorder, not elsewhere classified 07/31/2005   Diabetes mellitus without complication 09/08/2006   Endometriosis 09/14/2012   Hypertension 11/23/2003   Unspecified constipation 06/24/2004    Past  Surgical History:  Procedure Laterality Date   APPENDECTOMY  1996   Dr. Wiliam Ke   BREAST SURGERY Bilateral 1996   Dr. Margarito Liner HERNIA REPAIR     OVARIAN CYST REMOVAL     RIGHT/LEFT HEART CATH AND CORONARY ANGIOGRAPHY N/A 10/09/2021   Procedure: RIGHT/LEFT HEART CATH AND CORONARY ANGIOGRAPHY;  Surgeon: Orbie Pyo, MD;  Location: [facility];  Service: Cardiovascular;  Laterality: N/A;   TOOTH EXTRACTION N/A 07/28/2022   Procedure: EXTRACTION ALL REMAING TEETH THREE, FOUR, FIVE, SIX, SEVEN, EIGHT, NINE, TEN, ELEVEN, TWELVE, TWENTY ONE, TWENTY TWO, TWENTY THREE, TWENTY FOUR, TWENTY FIVE, TWENTY SIX, TWENTY SEVEN, TWENTY EIGHT, TWENTY NINE, THIRTY, AVEOLOPLASTY;  Surgeon: Ocie Doyne, DMD;  Location: [facility] OR;  Service: Oral Surgery;  Laterality: N/A;    Family History  Problem Relation Age of Onset   Diabetes Mother    Hypertension Mother    Arthritis Mother        RA   Pancreatic cancer Father    Stomach cancer Neg Hx    Colon cancer Neg Hx    Esophageal cancer Neg Hx    Rectal cancer Neg Hx     Social History   Socioeconomic History   Marital status: Married    Spouse name: Not on file   Number of children: 1   Years of education: Not on file   Highest education level: Not on file  Occupational History  Not on file  Tobacco Use   Smoking status: Never   Smokeless tobacco: Never  Vaping Use   Vaping Use: Never used  Substance and Sexual Activity   Alcohol use: No   Drug use: No   Sexual activity: Not Currently  Other Topics Concern   Not on file  Social History Narrative   Not on file   Social Determinants of Health   Financial Resource Strain: Medium Risk (10/31/2021)   Overall Financial Resource Strain (CARDIA)    Difficulty of Paying Living Expenses: Somewhat hard  Food Insecurity: No Food Insecurity (10/31/2021)   Hunger Vital Sign    Worried About Running Out of Food in the Last Year: Never true    Ran Out of Food in the Last Year: Never  true  Transportation Needs: No Transportation Needs (10/31/2021)   PRAPARE - Administrator, Civil ServiceTransportation    Lack of Transportation (Medical): No    Lack of Transportation (Non-Medical): No  Physical Activity: Not on file  Stress: Not on file  Social Connections: Not on file    Allergies  Allergen Reactions   Aspirin     Intolerance (aspirin sensitive stomach)   Darvocet [Propoxyphene N-Acetaminophen]     Skin reaction on Vaginal area   Erythromycin Nausea And Vomiting   Gabapentin Other (See Comments)    Suicidal ideations    Ibuprofen     Stomach issues   Metformin And Related Nausea Only    Outpatient Medications Prior to Visit  Medication Sig Dispense Refill   acetaminophen (TYLENOL) 500 MG tablet Take 1,000 mg by mouth every 6 (six) hours as needed for mild pain, fever or headache.     aspirin 81 MG chewable tablet Chew 1 tablet (81 mg total) by mouth daily. 30 tablet 1   clopidogrel (PLAVIX) 75 MG tablet Take 1 tablet (75 mg total) by mouth daily. 30 tablet 3   Continuous Blood Gluc Receiver (DEXCOM G7 RECEIVER) DEVI Use to check blood sugar 3 times daily. (Patient not taking: Reported on 10/22/2022) 1 each 0   Continuous Blood Gluc Sensor (DEXCOM G7 SENSOR) MISC Use to check blood sugar 3 times daily. (Patient not taking: Reported on 10/22/2022) 3 each 0   dapagliflozin propanediol (FARXIGA) 10 MG TABS tablet Take 1 tablet (10 mg total) by mouth daily. 30 tablet 3   diazepam (VALIUM) 5 MG tablet Insert 1 tablet vaginally nightly as needed for muscle spasm/ pelvic pain. 15 tablet 0   HYDROcodone-acetaminophen (NORCO) 5-325 MG tablet Take 1 tablet by mouth every 6 (six) hours as needed for moderate pain. (Patient not taking: Reported on 10/22/2022) 24 tablet 0   metoprolol succinate (TOPROL-XL) 25 MG 24 hr tablet Take 1 tablet (25 mg total) by mouth daily. Take with or immediately following a meal. 90 tablet 3   Multiple Vitamin (MULTIVITAMIN WITH MINERALS) TABS tablet Take 1 tablet by mouth  daily.     omeprazole (PRILOSEC) 40 MG capsule Take 1 capsule (40 mg total) by mouth daily. (Patient not taking: Reported on 10/22/2022) 30 capsule 1   sacubitril-valsartan (ENTRESTO) 49-51 MG Take 1 tablet by mouth 2 (two) times daily. 180 tablet 3   solifenacin (VESICARE) 10 MG tablet Take 1 tablet (10 mg total) by mouth daily. 30 tablet 5   vitamin B-12 (CYANOCOBALAMIN) 1000 MCG tablet Take 3,000 mcg by mouth daily.     atorvastatin (LIPITOR) 80 MG tablet Take 1 tablet (80 mg total) by mouth daily. 30 tablet 3   Insulin Lispro Prot &  Lispro (HUMALOG MIX 75/25 KWIKPEN) (75-25) 100 UNIT/ML Kwikpen Inject 22 Units into the skin in the morning AND 16 Units every evening. 15 mL 2   Insulin Pen Needle (PENTIPS) 32G X 4 MM MISC Use as directed with insulin pen 100 each 0   spironolactone (ALDACTONE) 25 MG tablet Take 1 tablet (25 mg total) by mouth daily. 90 tablet 0   No facility-administered medications prior to visit.     ROS Review of Systems  Constitutional:  Negative for activity change and appetite change.  HENT:  Negative for sinus pressure and sore throat.   Respiratory:  Negative for chest tightness, shortness of breath and wheezing.   Cardiovascular:  Negative for chest pain and palpitations.  Gastrointestinal:  Negative for abdominal distention, abdominal pain and constipation.  Genitourinary: Negative.   Musculoskeletal: Negative.   Neurological:  Positive for weakness and headaches.  Psychiatric/Behavioral:  Negative for behavioral problems and dysphoric mood.     Objective:  BP 121/63   Pulse 77   Temp 97.8 F (36.6 C)   Ht 5\' 4"  (1.626 m)   Wt 106 lb (48.1 kg)   LMP 11/16/2014 (Approximate)   SpO2 98%   BMI 18.19 kg/m      11/17/2022   11:20 AM 11/02/2022    1:26 PM 10/27/2022    9:16 AM  BP/Weight  Systolic BP 121 129 137  Diastolic BP 63 76 81  Wt. (Lbs) 106    BMI 18.19 kg/m2        Physical Exam Constitutional:      Appearance: She is well-developed.   Cardiovascular:     Rate and Rhythm: Normal rate.     Heart sounds: Normal heart sounds. No murmur heard. Pulmonary:     Effort: Pulmonary effort is normal.     Breath sounds: Normal breath sounds. No wheezing or rales.  Chest:     Chest wall: No tenderness.  Abdominal:     General: Bowel sounds are normal. There is no distension.     Palpations: Abdomen is soft. There is no mass.     Tenderness: There is no abdominal tenderness.  Musculoskeletal:        General: Normal range of motion.     Right lower leg: No edema.     Left lower leg: No edema.  Neurological:     Mental Status: She is alert and oriented to person, place, and time.  Psychiatric:        Mood and Affect: Mood normal.        Latest Ref Rng & Units 07/28/2022   12:55 PM 06/04/2022   10:20 AM 05/01/2022    4:14 PM  CMP  Glucose 70 - 99 mg/dL 161  096  045   BUN 6 - 20 mg/dL 10  8  8    Creatinine 0.44 - 1.00 mg/dL 4.09  8.11  9.14   Sodium 135 - 145 mmol/L 139  145  136   Potassium 3.5 - 5.1 mmol/L 4.5  3.7  3.4   Chloride 98 - 111 mmol/L 104  107  102   CO2 22 - 32 mmol/L 26  27  26    Calcium 8.9 - 10.3 mg/dL 8.9  8.9  9.1     Lipid Panel     Component Value Date/Time   CHOL 147 02/20/2022 1548   CHOL 192 12/13/2015 0856   TRIG 134 02/20/2022 1548   HDL 42 02/20/2022 1548   HDL 41 12/13/2015 0856  CHOLHDL 3.5 02/20/2022 1548   VLDL 27 02/20/2022 1548   LDLCALC 78 02/20/2022 1548   LDLCALC 118 (H) 12/13/2015 0856    CBC    Component Value Date/Time   WBC 11.5 (H) 07/28/2022 1106   RBC 6.23 (H) 07/28/2022 1106   HGB 16.2 (H) 07/28/2022 1106   HCT 51.9 (H) 07/28/2022 1106   PLT 337 07/28/2022 1106   MCV 83.3 07/28/2022 1106   MCH 26.0 07/28/2022 1106   MCHC 31.2 07/28/2022 1106   RDW 14.4 07/28/2022 1106   LYMPHSABS 1.1 10/08/2021 1947   MONOABS 0.7 10/08/2021 1947   EOSABS 0.0 10/08/2021 1947   BASOSABS 0.0 10/08/2021 1947    Lab Results  Component Value Date   HGBA1C 10.2 (A)  11/17/2022    Assessment & Plan:  1. Type 2 diabetes mellitus with hyperglycemia, with long-term current use of insulin Uncontrolled with A1c of 10.2, goal is less than 7.0 Increased Humalog 75/25 dose from 22 units in the morning and 16 units in the evening to 28 units in the morning and 16 units in the evening Counseled on Diabetic diet, my plate method, 820 minutes of moderate intensity exercise/week Blood sugar logs with fasting goals of 80-120 mg/dl, random of less than 601 and in the event of sugars less than 60 mg/dl or greater than 561 mg/dl encouraged to notify the clinic. Advised on the need for annual eye exams, annual foot exams, Pneumonia vaccine. - POCT glucose (manual entry) - POCT glycosylated hemoglobin (Hb A1C) - Insulin Pen Needle (PENTIPS) 32G X 4 MM MISC; Use as directed with insulin pen  Dispense: 100 each; Refill: 0 - Insulin Lispro Prot & Lispro (HUMALOG MIX 75/25 KWIKPEN) (75-25) 100 UNIT/ML Kwikpen; Inject 28 Units into the skin in the morning AND 16 Units every evening.  Dispense: 30 mL; Refill: 2 - atorvastatin (LIPITOR) 80 MG tablet; Take 1 tablet (80 mg total) by mouth daily.  Dispense: 90 tablet; Refill: 1  2. Bilateral carotid artery stenosis Seen by vascular with recommendation to monitor given stenosis is 50% and she is asymptomatic She will follow-up with vascular in 1 year Risk factor modification  3. Right hand weakness Status post EMG by [facility] Continue to follow-up with orthopedic  4. Hypertensive heart disease with chronic combined systolic and diastolic congestive heart failure EF of 55 to 60% Euvolemic Continue GDMT Followed by cardiology - spironolactone (ALDACTONE) 25 MG tablet; Take 1 tablet (25 mg total) by mouth daily.  Dispense: 90 tablet; Refill: 1  5. Lumbar radiculopathy With associated left lower extremity weakness Status post ESI Currently under the care of neurosurgery  6. Chronic nonintractable headache, unspecified  headache type Unknown etiology Symptoms do not point to was migraine Possibility of sinus etiology versus tension headaches Unable to place on Fioricet due to allergy She will keep a headache log which will be reviewed at her next visit Consider prophylactic medication if headaches are persistent - fluticasone (FLONASE) 50 MCG/ACT nasal spray; Place 2 sprays into both nostrils daily.  Dispense: 16 g; Refill: 1 - rizatriptan (MAXALT) 10 MG tablet; Take 1 tablet (10 mg total) by mouth once as needed for migraine. May repeat in 2 hours if needed  Dispense: 10 tablet; Refill: 1   Meds ordered this encounter  Medications   fluticasone (FLONASE) 50 MCG/ACT nasal spray    Sig: Place 2 sprays into both nostrils daily.    Dispense:  16 g    Refill:  1   Insulin Pen Needle (  PENTIPS) 32G X 4 MM MISC    Sig: Use as directed with insulin pen    Dispense:  100 each    Refill:  0   rizatriptan (MAXALT) 10 MG tablet    Sig: Take 1 tablet (10 mg total) by mouth once as needed for migraine. May repeat in 2 hours if needed    Dispense:  10 tablet    Refill:  1   Insulin Lispro Prot & Lispro (HUMALOG MIX 75/25 KWIKPEN) (75-25) 100 UNIT/ML Kwikpen    Sig: Inject 28 Units into the skin in the morning AND 16 Units every evening.    Dispense:  30 mL    Refill:  2   atorvastatin (LIPITOR) 80 MG tablet    Sig: Take 1 tablet (80 mg total) by mouth daily.    Dispense:  90 tablet    Refill:  1   spironolactone (ALDACTONE) 25 MG tablet    Sig: Take 1 tablet (25 mg total) by mouth daily.    Dispense:  90 tablet    Refill:  1    Follow-up: Return in about 3 months (around 02/16/2023) for Chronic medical conditions.       Hoy Register, MD, FAAFP. [facility] [place], [place] [phone]   11/17/2022, 12:59 PM

## 2022-11-17 NOTE — Patient Instructions (Signed)

## 2022-11-19 ENCOUNTER — Telehealth: Payer: Self-pay | Admitting: Physician Assistant

## 2022-11-19 DIAGNOSIS — R197 Diarrhea, unspecified: Secondary | ICD-10-CM

## 2022-11-19 NOTE — Telephone Encounter (Signed)
Patient called wanted to speak with Smiley Houseman nurse. States that she had a endoscopy and colonoscopy performed on 10/22/22. States she is still having complications swallowing and having a choking feeling. Also states that she is have diarrhea right after she eats. Requesting a call back to see what she can do. Please advise.

## 2022-11-20 NOTE — Telephone Encounter (Signed)
Called and spoke with patient regarding recommendations as outlined below. Pt will stop by the 2nd floor receptionist desk at her convenience to pick up Diatherix stool kit and instructions. Pt has been scheduled for a f/u with Dr. Adela Lank on 02/09/23 at 10:10 am. Pt verbalized understanding and had no concerns at the end of the call.

## 2022-11-20 NOTE — Telephone Encounter (Signed)
Called and spoke with patient. Pt states that she is having ongoing issues with swallowing. Pt states that she had a really bad episode this morning and was choking on water to the point it made her cough. Pt states that her symptoms improved some after the dilation. Pt states that sometimes her medications get stuck, but eventually go down. Pt states that she is being cautious while eating and taking small bites. Pt typically drinks regular liquids, nothing thickened. Pt is no longer taking Omeprazole. Pt also reports having issues with diarrhea prior to colonoscopy that have returned. Pt states that after the colon prep she was doing fine, now anytime she eats anything she has diarrhea. Pt reports having about 4-5 stools a day. Pt has not noticed any correlation to her diet. + intermittent mid abdominal pain. Denies fever. Pt has not tried any imodium b/c a provider told her "that was only a Band-Aid and she should find the source". Pt has not recently been on any antibiotics and has not been around anyone with similar symptoms. Pt is not taking any fiber supplements. EGD/colonoscopy completed on 10/22/22. Please advise, thanks.

## 2022-11-20 NOTE — Telephone Encounter (Signed)
Regarding dysphagia - prior barium swallow showed dysmotility which could be the cause of her dysphagia - her esophagus is not contracting appropriately. EGD did not show any problems, empiric dilation performed but sounds like it did not last. This may be a difficult issue to treat - we may need to consider esophageal manometry, I can see her in the office to discuss this if you can make her a follow up appointment. In the interim chew food well, drinking plenty of fluids and sit upright when swallowing. I don't have a medication I can give her to help swallowing right now but can discuss options further in the office.  If she thinks diarrhea is new and bothersome can consider stool testing - GI pathogen panel. Not sure if she is taking any new meds that could be related. Agree that okay to use immodium if no fever, etc, until this is sorted out, I can see her in the office to discuss this further as well if it persists. Her colonoscopy looked okay.

## 2022-11-24 ENCOUNTER — Other Ambulatory Visit: Payer: Self-pay

## 2022-11-24 ENCOUNTER — Other Ambulatory Visit: Payer: Self-pay | Admitting: Family Medicine

## 2022-11-24 MED ORDER — DAPAGLIFLOZIN PROPANEDIOL 10 MG PO TABS
10.0000 mg | ORAL_TABLET | Freq: Every day | ORAL | 3 refills | Status: DC
  Filled 2022-11-24 – 2022-12-08 (×2): qty 30, 30d supply, fill #0
  Filled 2023-01-07: qty 30, 30d supply, fill #1
  Filled 2023-02-08: qty 30, 30d supply, fill #2
  Filled 2023-03-10: qty 30, 30d supply, fill #3

## 2022-11-25 ENCOUNTER — Other Ambulatory Visit: Payer: Self-pay

## 2022-12-01 ENCOUNTER — Other Ambulatory Visit: Payer: Self-pay

## 2022-12-09 ENCOUNTER — Other Ambulatory Visit: Payer: Self-pay

## 2022-12-11 ENCOUNTER — Other Ambulatory Visit: Payer: Self-pay | Admitting: Family Medicine

## 2022-12-11 ENCOUNTER — Other Ambulatory Visit: Payer: Self-pay

## 2022-12-11 DIAGNOSIS — Z794 Long term (current) use of insulin: Secondary | ICD-10-CM

## 2022-12-11 DIAGNOSIS — E1165 Type 2 diabetes mellitus with hyperglycemia: Secondary | ICD-10-CM

## 2022-12-11 MED ORDER — DEXCOM G7 SENSOR MISC
6 refills | Status: DC
Start: 2022-12-11 — End: 2024-05-12
  Filled 2022-12-11: qty 3, 30d supply, fill #0
  Filled 2023-03-10: qty 3, 30d supply, fill #1
  Filled 2023-07-07: qty 3, 30d supply, fill #2

## 2022-12-11 NOTE — Progress Notes (Deleted)
[facility] Return Visit  SUBJECTIVE  History of Present Illness: Diania Reason is a 62 y.o. female seen in follow-up for OAB and pelvic floor pain. Plan at last visit was increase Vesicare to 10mg  daily, use vaginal valium as needed, and consider pelvic floor injections.     Past Medical History: Patient  has a past medical history of Abdominal pain, left lower quadrant (12/17/2015), Chest pain, unspecified (10/05/2003), CHF (congestive heart failure) (HCC), Depressive disorder, not elsewhere classified (07/31/2005), Diabetes mellitus without complication (HCC) (09/08/2006), Endometriosis (09/14/2012), Hypertension (11/23/2003), and Unspecified constipation (06/24/2004).   Past Surgical History: She  has a past surgical history that includes Inguinal hernia repair; Ovarian cyst removal; Breast surgery (Bilateral, 1996); Appendectomy (1996); RIGHT/LEFT HEART CATH AND CORONARY ANGIOGRAPHY (N/A, 10/09/2021); and Tooth Extraction (N/A, 07/28/2022).   Medications: She has a current medication list which includes the following prescription(s): acetaminophen, aspirin, atorvastatin, clopidogrel, dexcom g7 receiver, dexcom g7 sensor, dapagliflozin propanediol, diazepam, fluticasone, hydrocodone-acetaminophen, insulin lispro prot & lispro, pentips, metoprolol succinate, multivitamin with minerals, omeprazole, rizatriptan, sacubitril-valsartan, solifenacin, spironolactone, and cyanocobalamin.   Allergies: Patient is allergic to aspirin, darvocet [propoxyphene n-acetaminophen], erythromycin, gabapentin, ibuprofen, and metformin and related.   Social History: Patient  reports that she has never smoked. She has never used smokeless tobacco. She reports that she does not drink alcohol and does not use drugs.      OBJECTIVE     Physical Exam: There were no vitals filed for this visit. Gen: No apparent distress, A&O x 3.  Detailed Urogynecologic Evaluation:  Deferred. Prior exam  showed:      No data to display             ASSESSMENT AND PLAN    Ms. Coppler is a 62 y.o. with:  No diagnosis found.

## 2022-12-14 ENCOUNTER — Other Ambulatory Visit: Payer: Self-pay

## 2022-12-14 ENCOUNTER — Ambulatory Visit: Payer: Medicaid Other | Admitting: Obstetrics and Gynecology

## 2022-12-15 ENCOUNTER — Other Ambulatory Visit: Payer: Self-pay

## 2022-12-17 ENCOUNTER — Other Ambulatory Visit: Payer: Self-pay

## 2022-12-29 NOTE — Progress Notes (Unsigned)
[facility] Return Visit  SUBJECTIVE  History of Present Illness: Brooke [name] is a 62 y.o. female seen in follow-up for OAB. Plan at last visit was increase OAB medication, Vesicare to 10mg . Also using vaginal valium PRN.   Reports the Vesicare 10mg  is helping with the OAB.   Vaginal valium is helpful for her pain in the pelvic floor. She reports she did it nightly for a while and it was better.    Past Medical History: Patient  has a past medical history of Abdominal pain, left lower quadrant (12/17/2015), Chest pain, unspecified (10/05/2003), CHF (congestive heart failure) (HCC), Depressive disorder, not elsewhere classified (07/31/2005), Diabetes mellitus without complication (HCC) (09/08/2006), Endometriosis (09/14/2012), Hypertension (11/23/2003), and Unspecified constipation (06/24/2004).   Past Surgical History: She  has a past surgical history that includes Inguinal hernia repair; Ovarian cyst removal; Breast surgery (Bilateral, 1996); Appendectomy (1996); RIGHT/LEFT HEART CATH AND CORONARY ANGIOGRAPHY (N/A, 10/09/2021); and Tooth Extraction (N/A, 07/28/2022).   Medications: She has a current medication list which includes the following prescription(s): acetaminophen, aspirin, atorvastatin, clopidogrel, [START ON 12/31/2022] conjugated estrogens, dexcom g7 sensor, dapagliflozin propanediol, diazepam, fluconazole, fluticasone, insulin lispro prot & lispro, pentips, metoprolol succinate, multivitamin with minerals, omeprazole, rizatriptan, sacubitril-valsartan, solifenacin, spironolactone, cyanocobalamin, dexcom g7 receiver, and hydrocodone-acetaminophen.   Allergies: Patient is allergic to aspirin, darvocet [propoxyphene n-acetaminophen], erythromycin, gabapentin, ibuprofen, and metformin and related.   Social History: Patient  reports that she has never smoked. She has never used smokeless tobacco. She reports that she does not drink alcohol and does not use drugs.       OBJECTIVE     Physical Exam: Vitals:   12/30/22 1251  BP: 96/63  Pulse: 81   Gen: No apparent distress, A&O x 3.  Detailed Urogynecologic Evaluation:  Deferred.    ASSESSMENT AND PLAN    Brooke [name] is a 63 y.o. with:  1. Levator spasm   2. Vaginal atrophy   3. Urge incontinence   4. Yeast infection   5. Incontinence of feces, unspecified fecal incontinence type    Patient reports the vaginal valium has made a significant difference in her pelvic pain and spasms. She would like a refill today. I discussed with her trying to space out use of the vaginal valium and not using it nightly. Refill of 15, 5mg  vaginal valium sent in for patient with 1 refill. She should try to space this out over the 6 months.  Patient is having symptoms of vaginal atrophy that she reports are not well controlled with [place] jelly. Will start patient on vaginal estrogen. She is to use a blueberry sized amount onto the finger nightly for two weeks, then twice weekly after. Reports her 10mg  vesicare is working well for her and she notices the difference. Requested a 90 day supply. Prescription sent for patient.  Patient reports she [place]. Diflucan sent in for patient.  Patient is planning to follow up with GI regarding FI. She reports she is planning to start metamucil but has not yet.   Patient to follow up in 6 months or sooner if needed.

## 2022-12-30 ENCOUNTER — Other Ambulatory Visit: Payer: Self-pay

## 2022-12-30 ENCOUNTER — Ambulatory Visit: Payer: Medicaid Other | Admitting: Obstetrics and Gynecology

## 2022-12-30 ENCOUNTER — Encounter: Payer: Self-pay | Admitting: Obstetrics and Gynecology

## 2022-12-30 VITALS — BP 96/63 | HR 81

## 2022-12-30 DIAGNOSIS — R159 Full incontinence of feces: Secondary | ICD-10-CM | POA: Diagnosis not present

## 2022-12-30 DIAGNOSIS — N3941 Urge incontinence: Secondary | ICD-10-CM | POA: Diagnosis not present

## 2022-12-30 DIAGNOSIS — N952 Postmenopausal atrophic vaginitis: Secondary | ICD-10-CM | POA: Diagnosis not present

## 2022-12-30 DIAGNOSIS — B379 Candidiasis, unspecified: Secondary | ICD-10-CM | POA: Diagnosis not present

## 2022-12-30 DIAGNOSIS — M62838 Other muscle spasm: Secondary | ICD-10-CM | POA: Diagnosis not present

## 2022-12-30 MED ORDER — FLUCONAZOLE 150 MG PO TABS
150.0000 mg | ORAL_TABLET | Freq: Once | ORAL | 0 refills | Status: AC
  Filled 2022-12-30: qty 1, 1d supply, fill #0

## 2022-12-30 MED ORDER — DIAZEPAM 5 MG PO TABS
ORAL_TABLET | ORAL | 1 refills | Status: DC
  Filled 2022-12-30: qty 15, 15d supply, fill #0

## 2022-12-30 MED ORDER — ESTROGENS CONJUGATED 0.625 MG/GM VA CREA
1.0000 | TOPICAL_CREAM | VAGINAL | 11 refills | Status: DC
  Filled 2022-12-30: qty 30, 175d supply, fill #0
  Filled 2023-07-07: qty 30, 175d supply, fill #1
  Filled 2023-09-20 (×2): qty 30, 175d supply, fill #2

## 2022-12-30 MED ORDER — SOLIFENACIN SUCCINATE 10 MG PO TABS
10.0000 mg | ORAL_TABLET | Freq: Every day | ORAL | 3 refills | Status: DC
Start: 2022-12-30 — End: 2023-07-05
  Filled 2022-12-30: qty 90, 90d supply, fill #0
  Filled 2023-04-15: qty 90, 90d supply, fill #1

## 2022-12-30 NOTE — Patient Instructions (Addendum)
Try to only use the vaginal valium a few times a week. We are limited on how much we can give as this is a controlled substance.   90 day supply of your oab medication sent in today  Use vaginal estrogen nightly for two weeks, then twice a week after that.   A dose of Diflucan has been sent in for your yeast infection.

## 2023-01-08 ENCOUNTER — Other Ambulatory Visit: Payer: Self-pay

## 2023-01-12 NOTE — Telephone Encounter (Signed)
Patient came into the office today to pick up Diatherix stool kit.

## 2023-01-14 ENCOUNTER — Other Ambulatory Visit (HOSPITAL_COMMUNITY): Payer: Self-pay

## 2023-01-15 ENCOUNTER — Other Ambulatory Visit: Payer: Self-pay

## 2023-01-21 DIAGNOSIS — M5416 Radiculopathy, lumbar region: Secondary | ICD-10-CM | POA: Diagnosis not present

## 2023-01-26 ENCOUNTER — Telehealth: Payer: Self-pay | Admitting: *Deleted

## 2023-01-26 NOTE — Telephone Encounter (Signed)
   Name: Brooke [name]  DOB: 09-04-1960  MRN: [id number]  Primary Cardiologist: Donato Schultz, MD   Preoperative team, please contact this patient and set up a phone call appointment for further preoperative risk assessment. Please obtain consent and complete medication review. Thank you for your help.  I confirm that guidance regarding antiplatelet and oral anticoagulation therapy has been completed and, if necessary, noted below.  Per office protocol, if patient is without any new symptoms or concerns at the time of their virtual visit, she may hold Plavix for 5 days prior to procedure. Please resume Plavix as soon as possible postprocedure, at the discretion of the surgeon. Regarding ASA therapy, we recommend continuation of ASA throughout the perioperative period.  However, if the surgeon feels that cessation of ASA is required in the perioperative period, it may be stopped 5-7 days prior to surgery with a plan to resume it as soon as felt to be feasible from a surgical standpoint in the post-operative period.   Joylene Grapes, NP 01/26/2023, 7:39 AM [facility]

## 2023-01-26 NOTE — Telephone Encounter (Signed)
   Pre-operative Risk Assessment    Patient Name: Brooke [name]  DOB: [birth year] MRN: [id number]      Request for Surgical Clearance    Procedure:   [id number]  Date of Surgery:  Clearance TBD                                 Surgeon:  Dr. Aileen Fass Surgeon's Group or Practice Name:  [facility] Phone number:  [phone] Fax number:  [phone]   Type of Clearance Requested:   - Medical  - Pharmacy:  Hold Aspirin and Clopidogrel (Plavix) 7 days prior   Type of Anesthesia:  Not Indicated   Additional requests/questions:    Signed, Emmit Pomfret   01/26/2023, 7:10 AM

## 2023-01-26 NOTE — Telephone Encounter (Signed)
Lvm for pt to call office to be set for telephone clearance.

## 2023-01-27 NOTE — Telephone Encounter (Signed)
2nd attempt to reach pt regarding surgical clearance and the need for a tele visit.  Left another message for pt to call back and ask for preop team.

## 2023-01-28 NOTE — Telephone Encounter (Signed)
Left messages x 3 to reach the pt to set up tele pre op appt. I will update Dr. Kathlene Cote office. Will remove from the pre op call back until the pt calls back to set up tele appt.

## 2023-02-01 ENCOUNTER — Telehealth: Payer: Self-pay

## 2023-02-01 ENCOUNTER — Telehealth: Payer: Self-pay | Admitting: Cardiology

## 2023-02-01 NOTE — Telephone Encounter (Signed)
Pt was returning call to Pre-Op. She stated she'd be available today between 1 pm - 3 pm or tomorrow between 4 pm - 5 pm. Please advise.   Refer to 01/26/2023 phone encounter.

## 2023-02-01 NOTE — Telephone Encounter (Signed)
Spoke with patient who is agreeable to do a tele visit on 7/11 at 10:20a. Med rec and consent done.

## 2023-02-01 NOTE — Telephone Encounter (Signed)
  Patient Consent for Virtual Visit        Brooke [name] has provided verbal consent on 02/01/2023 for a virtual visit (video or telephone).   CONSENT FOR VIRTUAL VISIT FOR:  Brooke [name]  By participating in this virtual visit I agree to the following:  I hereby voluntarily request, consent and authorize [facility] and its employed or contracted physicians, physician assistants, nurse practitioners or other licensed health care professionals (the Practitioner), to provide me with telemedicine health care services (the "Services") as deemed necessary by the treating Practitioner. I acknowledge and consent to receive the Services by the Practitioner via telemedicine. I understand that the telemedicine visit will involve communicating with the Practitioner through live audiovisual communication technology and the disclosure of certain medical information by electronic transmission. I acknowledge that I have been given the opportunity to request an in-person assessment or other available alternative prior to the telemedicine visit and am voluntarily participating in the telemedicine visit.  I understand that I have the right to withhold or withdraw my consent to the use of telemedicine in the course of my care at any time, without affecting my right to future care or treatment, and that the Practitioner or I may terminate the telemedicine visit at any time. I understand that I have the right to inspect all information obtained and/or recorded in the course of the telemedicine visit and may receive copies of available information for a reasonable fee.  I understand that some of the potential risks of receiving the Services via telemedicine include:  Delay or interruption in medical evaluation due to technological equipment failure or disruption; Information transmitted may not be sufficient (e.g. poor resolution of images) to allow for appropriate medical decision making by the Practitioner;  and/or  In rare instances, security protocols could fail, causing a breach of personal health information.  Furthermore, I acknowledge that it is my responsibility to provide information about my medical history, conditions and care that is complete and accurate to the best of my ability. I acknowledge that Practitioner's advice, recommendations, and/or decision may be based on factors not within their control, such as incomplete or inaccurate data provided by me or distortions of diagnostic images or specimens that may result from electronic transmissions. I understand that the practice of medicine is not an exact science and that Practitioner makes no warranties or guarantees regarding treatment outcomes. I acknowledge that a copy of this consent can be made available to me via my patient portal [facility] MyChart), or I can request a printed copy by calling the office of [facility].    I understand that my insurance will be billed for this visit.   I have read or had this consent read to me. I understand the contents of this consent, which adequately explains the benefits and risks of the Services being provided via telemedicine.  I have been provided ample opportunity to ask questions regarding this consent and the Services and have had my questions answered to my satisfaction. I give my informed consent for the services to be provided through the use of telemedicine in my medical care

## 2023-02-03 ENCOUNTER — Telehealth: Payer: Self-pay | Admitting: Cardiology

## 2023-02-03 NOTE — Telephone Encounter (Signed)
Patient needs to change her pre opp telephone appt. Please advise

## 2023-02-03 NOTE — Telephone Encounter (Signed)
I s/w the pt and she needed to mover her time for 02/18/23 tele pre op appt due to conflict in her schedule. Pt has been changed to 02/18/23 @ 9 am. Pt thanked me for the help.

## 2023-02-09 ENCOUNTER — Other Ambulatory Visit: Payer: Medicaid Other

## 2023-02-09 ENCOUNTER — Ambulatory Visit: Payer: Medicaid Other | Admitting: Gastroenterology

## 2023-02-09 ENCOUNTER — Encounter: Payer: Self-pay | Admitting: Gastroenterology

## 2023-02-09 ENCOUNTER — Other Ambulatory Visit: Payer: Self-pay

## 2023-02-09 VITALS — BP 122/72 | HR 76 | Ht 64.0 in | Wt 114.0 lb

## 2023-02-09 DIAGNOSIS — R197 Diarrhea, unspecified: Secondary | ICD-10-CM

## 2023-02-09 DIAGNOSIS — R1310 Dysphagia, unspecified: Secondary | ICD-10-CM | POA: Diagnosis not present

## 2023-02-09 MED ORDER — LOPERAMIDE HCL 2 MG PO TABS: 2.0000 mg | ORAL_TABLET | ORAL | 0 refills | Status: DC | PRN

## 2023-02-09 NOTE — Progress Notes (Signed)
HPI :  62 year old female here for a follow-up visit for dysphagia and altered bowel habits.  She was last seen in the office in February, and for endoscopy in March.  This is my first time seeing her in the office, previously followed by Dr. Christella Hartigan.  Recall she has a past medical history as listed below including CHF (05/01/2022 echo with LVEF 55-60%) and CAD status post stent in 2023 on Plavix and aspirin, diabetes and multiple others  She was seen here previously with complaints of dysphagia.  This led to an EGD with me in March.  The exam was essentially normal.  I empirically dilated her esophagus to 17 mm.  She states this may have provided slight benefit for perhaps a week or so and then weaned and she is back to baseline.  Symptoms she thinks has been going on for perhaps the past year or so.  She has dysphagia to both solids and liquids, she localizes this to her upper mid chest.  She states this can happen several times per day, usually with most meals.  Over time it seems to be getting a bit worse.  She has no reflux symptoms that bother her in general.  She had a barium swallow in October 2023 showing "moderate dysmotility".  She is rather frustrated by her persistent symptoms over time.  Again, dilation did not appear to help this in any meaningful way.  She otherwise states her bowels can be altered at times.  She will have at baseline 1 formed stool once daily.  She states she has perhaps an episode of diarrhea every 5 to 7 days where she can have multiple stools at a time.  She denies any triggers for this that she is aware of.  She is not taking any new medicines she thinks could be related.  Symptoms have been going on for more than a year at this point.  She had a colonoscopy with me in March which was rather tortuous colon and a few small polyps but no active inflammation.  She was booked as a surveillance colonoscopy, I did not realize she had loose stools at the time, no biopsies  were taken.  Otherwise denies much of any pain at this point in time.  No blood in her stool.  Prior workup: Barium swallow 05/2022: IMPRESSION: 1.  Moderately decreased esophageal motility  2.  No esophageal mass, no significant esophageal stricture  EGD 10/22/22: - The Z-line was regular. - The exam of the esophagus was otherwise normal. - A guidewire was placed and the scope was withdrawn. Empiric dilation was performed in the entire esophagus with a Savary dilator with mild resistance at 16 mm and 17 mm. Relook endoscopy showed no mucosal wrents. - The entire examined stomach was normal. - The examined duodenum was normal.  Colonoscopy 10/22/22: - The perianal and digital rectal examinations were normal. - A diminutive polyp was found in the cecum. The polyp was sessile. The polyp was removed with a cold snare. Resection and retrieval were complete. - A 3 mm polyp was found in the ascending colon. The polyp was sessile. The polyp was removed with a cold snare. Resection and retrieval were complete. - The colon was tortuous - ultraslim pediatric colonsocope used for this exam. - Multiple small-mouthed diverticula were found in the sigmoid colon. - A 3 mm polyp was found in the rectum. The polyp was sessile. The polyp was removed with a cold snare. Resection and retrieval were complete. -  Internal hemorrhoids were found during retroflexion. - The exam was otherwise without abnormality.  1. Surgical [P], colon, rectum, polyp (1) HYPERPLASTIC POLYP. NEGATIVE FOR DYSPLASIA. 2. Surgical [P], colon, cecum, ascending, polyp (2) ONE FRAGMENT OF TUBULAR ADENOMA. TWO FRAGMENTS OF COLONIC MUCOSA WITHOUT SIGNIFICANT DIAGNOSTIC ALTERATION. NEGATIVE FOR HIGH-GRADE DYSPLASIA.  Repeat in 5 years given advanced polyp in the past   Past Medical History:  Diagnosis Date   Abdominal pain, left lower quadrant 12/17/2015   Chest pain, unspecified 10/05/2003   CHF (congestive heart failure) (HCC)     Depressive disorder, not elsewhere classified 07/31/2005   Diabetes mellitus without complication (HCC) 09/08/2006   Endometriosis 09/14/2012   Hypertension 11/23/2003   Unspecified constipation 06/24/2004     Past Surgical History:  Procedure Laterality Date   APPENDECTOMY  1996   Dr. Wiliam Ke   BREAST SURGERY Bilateral 1996   Dr. Margarito Liner HERNIA REPAIR     OVARIAN CYST REMOVAL     RIGHT/LEFT HEART CATH AND CORONARY ANGIOGRAPHY N/A 10/09/2021   Procedure: RIGHT/LEFT HEART CATH AND CORONARY ANGIOGRAPHY;  Surgeon: Orbie Pyo, MD;  Location: [facility] INVASIVE CV LAB;  Service: Cardiovascular;  Laterality: N/A;   TOOTH EXTRACTION N/A 07/28/2022   Procedure: EXTRACTION ALL REMAING TEETH THREE, FOUR, FIVE, SIX, SEVEN, EIGHT, NINE, TEN, ELEVEN, TWELVE, TWENTY ONE, TWENTY TWO, TWENTY THREE, TWENTY FOUR, TWENTY FIVE, TWENTY SIX, TWENTY SEVEN, TWENTY EIGHT, TWENTY NINE, THIRTY, AVEOLOPLASTY;  Surgeon: Ocie Doyne, DMD;  Location: [facility] OR;  Service: Oral Surgery;  Laterality: N/A;   Family History  Problem Relation Age of Onset   Diabetes Mother    Hypertension Mother    Arthritis Mother        RA   Pancreatic cancer Father    Stomach cancer Neg Hx    Colon cancer Neg Hx    Esophageal cancer Neg Hx    Rectal cancer Neg Hx    Social History   Tobacco Use   Smoking status: Never   Smokeless tobacco: Never  Vaping Use   Vaping Use: Never used  Substance Use Topics   Alcohol use: No   Drug use: No   Current Outpatient Medications  Medication Sig Dispense Refill   acetaminophen (TYLENOL) 500 MG tablet Take 1,000 mg by mouth every 6 (six) hours as needed for mild pain, fever or headache.     alum & mag hydroxide-simeth (MYLANTA MAXIMUM STRENGTH) 400-400-40 MG/5ML suspension Take 5 mLs by mouth every 6 (six) hours as needed for indigestion.     aspirin 81 MG chewable tablet Chew 1 tablet (81 mg total) by mouth daily. 30 tablet 1   atorvastatin (LIPITOR) 80 MG tablet Take 1  tablet (80 mg total) by mouth daily. 90 tablet 1   clopidogrel (PLAVIX) 75 MG tablet Take 1 tablet (75 mg total) by mouth daily. 30 tablet 3   conjugated estrogens (PREMARIN) vaginal cream Insert 0.5g vaginally nightly for two weeks, then twice a week after 42.5 g 11   Continuous Blood Gluc Receiver (DEXCOM G7 RECEIVER) DEVI Use to check blood sugar 3 times daily. 1 each 0   Continuous Glucose Sensor (DEXCOM G7 SENSOR) MISC Use to check blood sugar 3 times daily. 3 each 6   dapagliflozin propanediol (FARXIGA) 10 MG TABS tablet Take 1 tablet (10 mg total) by mouth daily. 30 tablet 3   diazepam (VALIUM) 5 MG tablet Insert 1 tablet vaginally nightly as needed for muscle spasm/ pelvic pain. 15 tablet 1   Insulin  Lispro Prot & Lispro (HUMALOG MIX 75/25 KWIKPEN) (75-25) 100 UNIT/ML Kwikpen Inject 28 Units into the skin in the morning AND 16 Units every evening. 30 mL 2   Insulin Pen Needle (PENTIPS) 32G X 4 MM MISC Use as directed with insulin pen 100 each 0   metoprolol succinate (TOPROL-XL) 25 MG 24 hr tablet Take 1 tablet (25 mg total) by mouth daily. Take with or immediately following a meal. 90 tablet 3   Multiple Vitamin (MULTIVITAMIN WITH MINERALS) TABS tablet Take 1 tablet by mouth daily.     sacubitril-valsartan (ENTRESTO) 49-51 MG Take 1 tablet by mouth 2 (two) times daily. 180 tablet 3   solifenacin (VESICARE) 10 MG tablet Take 1 tablet (10 mg total) by mouth daily. 90 tablet 3   spironolactone (ALDACTONE) 25 MG tablet Take 1 tablet (25 mg total) by mouth daily. 90 tablet 1   vitamin B-12 (CYANOCOBALAMIN) 1000 MCG tablet Take 3,000 mcg by mouth daily.     No current facility-administered medications for this visit.   Allergies  Allergen Reactions   Aspirin     Intolerance (aspirin sensitive stomach)   Darvocet [Propoxyphene N-Acetaminophen]     Skin reaction on Vaginal area   Erythromycin Nausea And Vomiting   Gabapentin Other (See Comments)    Suicidal ideations    Ibuprofen      Stomach issues   Metformin And Related Nausea Only     Review of Systems: All systems reviewed and negative except where noted in HPI.   Lab Results  Component Value Date   WBC 11.5 (H) 07/28/2022   HGB 16.2 (H) 07/28/2022   HCT 51.9 (H) 07/28/2022   MCV 83.3 07/28/2022   PLT 337 07/28/2022    Lab Results  Component Value Date   CREATININE 0.77 07/28/2022   BUN 10 07/28/2022   NA 139 07/28/2022   K 4.5 07/28/2022   CL 104 07/28/2022   CO2 26 07/28/2022    Lab Results  Component Value Date   ALT 32 02/20/2022   AST 27 02/20/2022   ALKPHOS 70 02/20/2022   BILITOT 0.3 02/20/2022     Physical Exam: BP 122/72   Pulse 76   Ht 5\' 4"  (1.626 m)   Wt 114 lb (51.7 kg)   LMP 11/16/2014 (Approximate)   SpO2 96%   BMI 19.57 kg/m  Constitutional: Pleasant,well-developed, female in no acute distress. Neurological: Alert and oriented to person place and time. Psychiatric: Normal mood and affect. Behavior is normal.   ASSESSMENT: 62 y.o. female here for assessment of the following  1. Dysphagia, unspecified type   2. Diarrhea, unspecified type    Discussed her history of dysphagia.  EGD showed no cause for her symptoms.  Empiric dilation did not provide benefit.  She has dysphagia to both solids and liquids as outlined, suspicious for dysmotility.  Barium swallow suggests moderate dysmotility from October.  I suspect that is what is driving her symptoms and we discussed this for a bit.  We discussed how aggressive she wanted to be with further workup of it.  I offered her esophageal manometry to assess for something that is treatable versus nonspecific dysmotility.  We discussed this can be difficult to treat if she has just nonspecific dysmotility.  Needs to focus on eating small bites, sitting upright when eating etc.  Following discussion of esophageal manometry she wants to proceed with it, counseled her this is unfortunately booking out a few months at the hospital but she  is  willing to wait to have it done.  Otherwise reviewed her bowel changes.  Ongoing for some time now.  Recent colonoscopy looked okay although biopsies were not taken as did not realize she was having symptoms at the time.  We discussed options.  She will look through her diet to see if eating any trigger foods and I provided her a handout on a low FODMAP diet.  Will send her serologies for celiac disease to make sure negative.  Otherwise recommend some Imodium as needed at onset of symptoms and see if that will help in the interim.  She agrees.   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 Follow up as needed if symptoms persist  Harlin Rain, MD [facility]

## 2023-02-09 NOTE — Patient Instructions (Signed)
_______________________________________________________  If your blood pressure at your visit was 140/90 or greater, please contact your primary care physician to follow up on this.  _______________________________________________________  If you are age 60 or older, your body mass index should be between 23-30. Your Body mass index is 19.57 kg/m. If this is out of the aforementioned range listed, please consider follow up with your Primary Care Provider.  If you are age 64 or younger, your body mass index should be between 19-25. Your Body mass index is 19.57 kg/m. If this is out of the aformentioned range listed, please consider follow up with your Primary Care Provider.   ________________________________________________________  The [facility] providers would like to encourage you to use [facility] to communicate with providers for non-urgent requests or questions.  Due to long hold times on the telephone, sending your provider a message by [facility] may be a faster and more efficient way to get a response.  Please allow 48 business hours for a response.  Please remember that this is for non-urgent requests.  _______________________________________________________  Brooke [name] have been scheduled for an esophageal manometry at [facility] on Wed, 05-26-23 at 12:30pm. Please arrive 30 minutes prior to your procedure for registration. You will need to go to outpatient registration (1st floor of the hospital) first. Make certain to bring your insurance cards as well as a complete list of medications.  Please remember the following:  1) Do not take any muscle relaxants, xanax (alprazolam) or ativan for 1 day prior to your test as well as the day of the test.  2) Nothing to eat or drink for 8 hours before your test.  3) Hold all diabetic medications/insulin the morning of the test. You may eat and take your medications after the test.  It will take at least 2 weeks to receive the results of  this test from your physician. ------------------------------------------ ABOUT ESOPHAGEAL MANOMETRY Esophageal manometry (muh-NOM-uh-tree) is a test that gauges how well your esophagus works. Your esophagus is the long, muscular tube that connects your throat to your stomach. Esophageal manometry measures the rhythmic muscle contractions (peristalsis) that occur in your esophagus when you swallow. Esophageal manometry also measures the coordination and force exerted by the muscles of your esophagus.  During esophageal manometry, a thin, flexible tube (catheter) that contains sensors is passed through your nose, down your esophagus and into your stomach. Esophageal manometry can be helpful in diagnosing some mostly uncommon disorders that affect your esophagus.  Why it's done Esophageal manometry is used to evaluate the movement (motility) of food through the esophagus and into the stomach. The test measures how well the circular bands of muscle (sphincters) at the top and bottom of your esophagus open and close, as well as the pressure, strength and pattern of the wave of esophageal muscle contractions that moves food along.  What you can expect Esophageal manometry is an outpatient procedure done without sedation. Most people tolerate it well. You may be asked to change into a hospital gown before the test starts.  During esophageal manometry  While you are sitting up, a member of your health care team sprays your throat with a numbing medication or puts numbing gel in your nose or both.  A catheter is guided through your nose into your esophagus. The catheter may be sheathed in a water-filled sleeve. It doesn't interfere with your breathing. However, your eyes may water, and you may gag. You may have a slight nosebleed from irritation.  After the catheter  is in place, you may be asked to lie on your back on an exam table, or you may be asked to remain seated.  You then swallow small sips of water. As  you do, a computer connected to the catheter records the pressure, strength and pattern of your esophageal muscle contractions.  During the test, you'll be asked to breathe slowly and smoothly, remain as still as possible, and swallow only when you're asked to do so.  A member of your health care team may move the catheter down into your stomach while the catheter continues its measurements.  The catheter then is slowly withdrawn. The test usually lasts 20 to 30 minutes.  After esophageal manometry  When your esophageal manometry is complete, you may return to your normal activities  This test typically takes 30-45 minutes to complete.  ______________________________________________________________________  Your provider has requested that you go to the basement level for lab work before leaving today. Press "B" on the elevator. The lab is located at the first door on the left as you exit the elevator.  Start over the counter Imodium, take as needed when symptoms occur.    Due to recent changes in healthcare laws, you may see the results of your imaging and laboratory studies on MyChart before your provider has had a chance to review them.  We understand that in some cases there may be results that are confusing or concerning to you. Not all laboratory results come back in the same time frame and the provider may be waiting for multiple results in order to interpret others.  Please give [place] 48 hours in order for your provider to thoroughly review all the results before contacting the office for clarification of your results.    It was a pleasure to see you today!  Thank you for trusting me with your gastrointestinal care!      ________________________________________________________________________  ________________________________________________________________________________

## 2023-02-10 LAB — TISSUE TRANSGLUTAMINASE, IGA: (tTG) Ab, IgA: <1 U/mL

## 2023-02-10 LAB — IGA: Immunoglobulin A: 305 mg/dL (ref 47–310)

## 2023-02-18 ENCOUNTER — Other Ambulatory Visit: Payer: Self-pay

## 2023-02-18 ENCOUNTER — Ambulatory Visit: Payer: Medicaid Other | Attending: Cardiovascular Disease

## 2023-02-18 DIAGNOSIS — Z01810 Encounter for preprocedural cardiovascular examination: Secondary | ICD-10-CM

## 2023-02-18 NOTE — Progress Notes (Signed)
Virtual Visit via Telephone Note   Because of Brooke [name]'s co-morbid illnesses, she is at least at moderate risk for complications without adequate follow up.  This format is felt to be most appropriate for this patient at this time.  The patient did not have access to video technology/had technical difficulties with video requiring transitioning to audio format only (telephone).  All issues noted in this document were discussed and addressed.  No physical exam could be performed with this format.  Please refer to the patient's chart for her consent to telehealth for [facility]  Evaluation Performed:  Preoperative cardiovascular risk assessment _____________   Date:  02/18/2023   Patient ID:  Brooke [name], DOB 07/31/1961, MRN [id number] Patient Location:  Home Provider location:   Office  Primary Care Provider:  Hoy Register, MD Primary Cardiologist:  Donato Schultz, MD  Chief Complaint / Patient Profile   62 y.o. y/o female with a h/o CAD, HFrEF, DM2, HLD, and HTN who is pending  L4-L5-ESI and presents today for telephonic preoperative cardiovascular risk assessment.  History of Present Illness    Brooke [name] is a 62 y.o. female who presents via audio/video conferencing for a telehealth visit today.  Pt was last seen in cardiology clinic on 09/29/22 by Robin Searing NP.  At that time Dorea Duff was doing well.  The patient is now pending procedure as outlined above. Since her last visit, she continues to do well. She climbs 11 steps to her house daily and can participate in grocery shopping without angina.  Past Medical History    Past Medical History:  Diagnosis Date   Abdominal pain, left lower quadrant 12/17/2015   Chest pain, unspecified 10/05/2003   CHF (congestive heart failure) (HCC)    Depressive disorder, not elsewhere classified 07/31/2005   Diabetes mellitus without complication (HCC) 09/08/2006   Endometriosis 09/14/2012   Hypertension  11/23/2003   Unspecified constipation 06/24/2004   Past Surgical History:  Procedure Laterality Date   APPENDECTOMY  1996   Dr. Wiliam Ke   BREAST SURGERY Bilateral 1996   Dr. Margarito Liner HERNIA REPAIR     OVARIAN CYST REMOVAL     RIGHT/LEFT HEART CATH AND CORONARY ANGIOGRAPHY N/A 10/09/2021   Procedure: RIGHT/LEFT HEART CATH AND CORONARY ANGIOGRAPHY;  Surgeon: Orbie Pyo, MD;  Location: [facility];  Service: Cardiovascular;  Laterality: N/A;   TOOTH EXTRACTION N/A 07/28/2022   Procedure: EXTRACTION ALL REMAING TEETH THREE, FOUR, FIVE, SIX, SEVEN, EIGHT, NINE, TEN, ELEVEN, TWELVE, TWENTY ONE, TWENTY TWO, TWENTY THREE, TWENTY FOUR, TWENTY FIVE, TWENTY SIX, TWENTY SEVEN, TWENTY EIGHT, TWENTY NINE, THIRTY, AVEOLOPLASTY;  Surgeon: Ocie Doyne, DMD;  Location: [facility] OR;  Service: Oral Surgery;  Laterality: N/A;    Allergies  Allergies  Allergen Reactions   Aspirin     Intolerance (aspirin sensitive stomach)   Darvocet [Propoxyphene N-Acetaminophen]     Skin reaction on Vaginal area   Erythromycin Nausea And Vomiting   Gabapentin Other (See Comments)    Suicidal ideations    Ibuprofen     Stomach issues   Metformin And Related Nausea Only    Home Medications    Prior to Admission medications   Medication Sig Start Date End Date Taking? Authorizing Provider  acetaminophen (TYLENOL) 500 MG tablet Take 1,000 mg by mouth every 6 (six) hours as needed for mild pain, fever or headache.    [provider]  alum & mag hydroxide-simeth (MYLANTA MAXIMUM STRENGTH) 400-400-40 MG/5ML suspension  Take 5 mLs by mouth every 6 (six) hours as needed for indigestion.    [provider]  aspirin 81 MG chewable tablet Chew 1 tablet (81 mg total) by mouth daily. 12/15/21   Bensimhon, Bevelyn Buckles, MD  atorvastatin (LIPITOR) 80 MG tablet Take 1 tablet (80 mg total) by mouth daily. 11/17/22   Hoy Register, MD  clopidogrel (PLAVIX) 75 MG tablet Take 1 tablet (75 mg total) by  mouth daily. 08/18/22   Hoy Register, MD  conjugated estrogens (PREMARIN) vaginal cream Insert 0.5g vaginally nightly for two weeks, then twice a week after 12/31/22   Selmer Dominion, NP  Continuous Blood Gluc Receiver (DEXCOM G7 RECEIVER) DEVI Use to check blood sugar 3 times daily. 06/02/22   Hoy Register, MD  Continuous Glucose Sensor (DEXCOM G7 SENSOR) MISC Use to check blood sugar 3 times daily. 12/11/22   Hoy Register, MD  dapagliflozin propanediol (FARXIGA) 10 MG TABS tablet Take 1 tablet (10 mg total) by mouth daily. 11/24/22   Hoy Register, MD  diazepam (VALIUM) 5 MG tablet Insert 1 tablet vaginally nightly as needed for muscle spasm/ pelvic pain. 12/30/22   Selmer Dominion, NP  Insulin Lispro Prot & Lispro (HUMALOG MIX 75/25 KWIKPEN) (75-25) 100 UNIT/ML Kwikpen Inject 28 Units into the skin in the morning AND 16 Units every evening. 11/17/22   Hoy Register, MD  Insulin Pen Needle (PENTIPS) 32G X 4 MM MISC Use as directed with insulin pen 11/17/22   Hoy Register, MD  loperamide (IMODIUM A-D) 2 MG tablet Take 1 tablet (2 mg total) by mouth as needed for diarrhea or loose stools. 02/09/23   Armbruster, Willaim Rayas, MD  metoprolol succinate (TOPROL-XL) 25 MG 24 hr tablet Take 1 tablet (25 mg total) by mouth daily. Take with or immediately following a meal. 03/10/22   Hoy Register, MD  Multiple Vitamin (MULTIVITAMIN WITH MINERALS) TABS tablet Take 1 tablet by mouth daily.    [provider]  sacubitril-valsartan (ENTRESTO) 49-51 MG Take 1 tablet by mouth 2 (two) times daily. 03/10/22   Hoy Register, MD  solifenacin (VESICARE) 10 MG tablet Take 1 tablet (10 mg total) by mouth daily. 12/30/22   Selmer Dominion, NP  spironolactone (ALDACTONE) 25 MG tablet Take 1 tablet (25 mg total) by mouth daily. 11/17/22   Hoy Register, MD  vitamin B-12 (CYANOCOBALAMIN) 1000 MCG tablet Take 3,000 mcg by mouth daily.    [provider]    Physical Exam    Vital Signs:  Kevionna Heffler does not have vital signs available for review today.  Given telephonic nature of communication, physical exam is limited. AAOx3. NAD. Normal affect.  Speech and respirations are unlabored.  Accessory Clinical Findings    None  Assessment & Plan    1.  Preoperative Cardiovascular Risk Assessment:  She has a history of biventricular failure and multivessel CAD felt not a candidate for CABG. She is on good medical therapy with improvement in her EF on most recent echocardiogram. She does require insulin. According to the RCRI, she has am 11% risk of MACE for any procedure. She reports activity equivalent to greater than 4.0 METS without angina. She does not have any current unstable cardiac conditions and does not require further workup. We discussed her risk and she agrees to proceed.   The patient was advised that if she develops new symptoms prior to surgery to contact our office to arrange for a follow-up visit, and she verbalized understanding.  Antiplatelet  hold: Surgeon requesting to hold ASA and plavix 7 days prior to surgery. Given multivessel disease, we prefer to continue ASA. However, since she will not have anesthesia and the nature of the procedure involves the epidural space, will likely need to hold ASA as well.      A copy of this note will be routed to requesting surgeon.  Time:   Today, I have spent 10 minutes with the patient with telehealth technology discussing medical history, symptoms, and management plan.     Roe Rutherford Ghali Morissette, PA  02/18/2023, 9:09 AM

## 2023-02-23 ENCOUNTER — Encounter: Payer: Self-pay | Admitting: Family Medicine

## 2023-02-23 ENCOUNTER — Ambulatory Visit: Payer: Medicaid Other | Attending: Family Medicine | Admitting: Family Medicine

## 2023-02-23 ENCOUNTER — Other Ambulatory Visit: Payer: Self-pay

## 2023-02-23 VITALS — BP 95/60 | HR 79 | Ht 64.0 in | Wt 112.6 lb

## 2023-02-23 DIAGNOSIS — I779 Disorder of arteries and arterioles, unspecified: Secondary | ICD-10-CM | POA: Diagnosis not present

## 2023-02-23 DIAGNOSIS — Z794 Long term (current) use of insulin: Secondary | ICD-10-CM | POA: Diagnosis not present

## 2023-02-23 DIAGNOSIS — M25571 Pain in right ankle and joints of right foot: Secondary | ICD-10-CM | POA: Diagnosis not present

## 2023-02-23 DIAGNOSIS — E1165 Type 2 diabetes mellitus with hyperglycemia: Secondary | ICD-10-CM

## 2023-02-23 DIAGNOSIS — M5416 Radiculopathy, lumbar region: Secondary | ICD-10-CM | POA: Diagnosis not present

## 2023-02-23 DIAGNOSIS — R519 Headache, unspecified: Secondary | ICD-10-CM | POA: Diagnosis not present

## 2023-02-23 DIAGNOSIS — E46 Unspecified protein-calorie malnutrition: Secondary | ICD-10-CM

## 2023-02-23 DIAGNOSIS — R197 Diarrhea, unspecified: Secondary | ICD-10-CM | POA: Diagnosis not present

## 2023-02-23 LAB — POCT GLYCOSYLATED HEMOGLOBIN (HGB A1C): HbA1c, POC (controlled diabetic range): 9.9 % — AB (ref 0.0–7.0)

## 2023-02-23 LAB — GLUCOSE, POCT (MANUAL RESULT ENTRY): POC Glucose: 203 mg/dl — AB (ref 70–99)

## 2023-02-23 MED ORDER — INSULIN LISPRO PROT & LISPRO (75-25 MIX) 100 UNIT/ML KWIKPEN
PEN_INJECTOR | SUBCUTANEOUS | 2 refills | Status: DC
Start: 2023-02-23 — End: 2024-03-05
  Filled 2023-02-23: qty 30, 63d supply, fill #0
  Filled 2023-05-06: qty 30, 63d supply, fill #1
  Filled 2023-07-07: qty 30, 63d supply, fill #2

## 2023-02-23 MED ORDER — CLOPIDOGREL BISULFATE 75 MG PO TABS
75.0000 mg | ORAL_TABLET | Freq: Every day | ORAL | 1 refills | Status: DC
  Filled 2023-02-23: qty 90, 90d supply, fill #0
  Filled 2023-07-07: qty 90, 90d supply, fill #1

## 2023-02-23 MED ORDER — VENLAFAXINE HCL ER 75 MG PO CP24
75.0000 mg | ORAL_CAPSULE | Freq: Every day | ORAL | 3 refills | Status: DC
  Filled 2023-02-23: qty 30, 30d supply, fill #0

## 2023-02-23 NOTE — Patient Instructions (Signed)
General Headache Without Cause A headache is pain or discomfort you feel around the head or neck area. There are many causes and types of headaches. In some cases, the cause may not be found. Follow these instructions at home: Watch your condition for any changes. Let your doctor know about them. Take these steps to help with your condition: Managing pain     Take over-the-counter and prescription medicines only as told by your doctor. This includes medicines for pain that are taken by mouth or put on the skin. Lie down in a dark, quiet room when you have a headache. If told, put ice on your head and neck area: Put ice in a plastic bag. Place a towel between your skin and the bag. Leave the ice on for 20 minutes, 2-3 times per day. Take off the ice if your skin turns bright red. This is very important. If you cannot feel pain, heat, or cold, you have a greater risk of damage to the area. If told, put heat on the affected area. Use the heat source that your doctor recommends, such as a moist heat pack or a heating pad. Place a towel between your skin and the heat source. Leave the heat on for 20-30 minutes. Take off the heat if your skin turns bright red. This is very important. If you cannot feel pain, heat, or cold, you have a greater risk of getting burned. Keep lights dim if bright lights bother you or make your headaches worse. Eating and drinking Eat meals on a regular schedule. If you drink alcohol: Limit how much you have to: 0-1 drink a day for women who are not pregnant. 0-2 drinks a day for men. Know how much alcohol is in a drink. In the [place] one drink equals one 12 oz bottle of beer (355 mL), one 5 oz glass of wine (148 mL), or one 1 oz glass of hard liquor (44 mL). Stop drinking caffeine, or drink less caffeine. General instructions  Keep a journal to find out if certain things bring on headaches. For example, write down: What you eat and drink. How much sleep you  get. Any change to your diet or medicines. Get a massage or try other ways to relax. Limit stress. Sit up straight. Do not tighten (tense) your muscles. Do not smoke or use any products that contain nicotine or tobacco. If you need help quitting, ask your doctor. Exercise regularly as told by your doctor. Get enough sleep. This often means 7-9 hours of sleep each night. Keep all follow-up visits. This is important. Contact a doctor if: Medicine does not help your symptoms. You have a headache that feels different than the other headaches. You feel like you may vomit (nauseous) or you vomit. You have a fever. Get help right away if: Your headache: Gets very bad quickly. Gets worse after a lot of physical activity. You have any of these symptoms: You continue to vomit. A stiff neck. Trouble seeing. Your eye or ear hurts. Trouble speaking. Weak muscles or you lose muscle control. You lose your balance or have trouble walking. You feel like you will pass out (faint) or you pass out. You are mixed up (confused). You have a seizure. These symptoms may be an emergency. Get help right away. Call your local emergency services (911 in the [place] Do not wait to see if the symptoms will go away. Do not drive yourself to the hospital. Summary A headache is pain or discomfort that  is felt around the head or neck area. There are many causes and types of headaches. In some cases, the cause may not be found. Keep a journal to help find out what causes your headaches. Watch your condition for any changes. Let your doctor know about them. Contact a doctor if you have a headache that is different from usual, or if medicine does not help your headache. Get help right away if your headache gets very bad, you throw up, you have trouble seeing, you lose your balance, or you have a seizure. This information is not intended to replace advice given to you by your health care provider. Make sure you  discuss any questions you have with your health care provider. Document Revised: 12/25/2020 Document Reviewed: 12/25/2020 Elsevier Patient Education  2024 [employer].

## 2023-02-23 NOTE — Progress Notes (Signed)
Right ankle pain after fall yesterday Headaches Left leg pain Frequent diarrhea.

## 2023-02-23 NOTE — Progress Notes (Signed)
Subjective:  Patient ID: Brooke [name], female    DOB: 04-07-1961  Age: 62 y.o. MRN: [id number]  CC: Diabetes   HPI Brooke [name] is a 62 y.o. year old female with a history of type 2 diabetes mellitus (A1c 9.9), HFmEF (EF 55 to 60% on echo of 04/2022 improved from 20 to 25%), CAD, hypertension, anxiety and depression, protein calorie malnutrition, lumbar radiculopathy  Interval History: Discussed the use of AI scribe software for clinical note transcription with the patient, who gave verbal consent to proceed.  She presents with poorly controlled diabetes, frequent diarrhea, left leg pain, and headaches.  Despite taking 28 units of insulin in the morning and 16 units in the evening, her A1c is 9.9 and her glucose is 203. Her home glucose readings range from 140-200, with the higher readings typically occurring during the day.  She has been experiencing frequent diarrhea and has seen a GI specialist, Dr. Adela Lank, who conducted a blood test and advised her to take Imodium AD. She has not been taking the Imodium regularly but reports that it usually controls her symptoms when she does take it.  Her left leg pain is severe enough to cause her to trip while walking, and she reports that her leg will sometimes "just stop midair."  She has received epidural spinal injections in the past due to her lumbar radiculopathy and has an upcoming appointment next week with her spine surgeon.  She has been experiencing headaches for several months, which she describes as a 10/10 pain located in the center of her head. The headaches occur a few times a week and can last from one to several hours. Over-the-counter Tylenol does not provide relief.  Denies presence of photophobia, nausea or vomiting, she has no neck pain.  Denies presence of sinus symptoms.  In addition to these symptoms, she recently fell and twisted her right ankle, which is now swollen and painful.  When she fell she did hit the left side  of her head yesterday.    Past Medical History:  Diagnosis Date   Abdominal pain, left lower quadrant 12/17/2015   Chest pain, unspecified 10/05/2003   CHF (congestive heart failure) (HCC)    Depressive disorder, not elsewhere classified 07/31/2005   Diabetes mellitus without complication (HCC) 09/08/2006   Endometriosis 09/14/2012   Hypertension 11/23/2003   Unspecified constipation 06/24/2004    Past Surgical History:  Procedure Laterality Date   APPENDECTOMY  1996   Dr. Wiliam Ke   BREAST SURGERY Bilateral 1996   Dr. Margarito Liner HERNIA REPAIR     OVARIAN CYST REMOVAL     RIGHT/LEFT HEART CATH AND CORONARY ANGIOGRAPHY N/A 10/09/2021   Procedure: RIGHT/LEFT HEART CATH AND CORONARY ANGIOGRAPHY;  Surgeon: Orbie Pyo, MD;  Location: [facility];  Service: Cardiovascular;  Laterality: N/A;   TOOTH EXTRACTION N/A 07/28/2022   Procedure: EXTRACTION ALL REMAING TEETH THREE, FOUR, FIVE, SIX, SEVEN, EIGHT, NINE, TEN, ELEVEN, TWELVE, TWENTY ONE, TWENTY TWO, TWENTY THREE, TWENTY FOUR, TWENTY FIVE, TWENTY SIX, TWENTY SEVEN, TWENTY EIGHT, TWENTY NINE, THIRTY, AVEOLOPLASTY;  Surgeon: Ocie Doyne, DMD;  Location: [facility] OR;  Service: Oral Surgery;  Laterality: N/A;    Family History  Problem Relation Age of Onset   Diabetes Mother    Hypertension Mother    Arthritis Mother        RA   Pancreatic cancer Father    Stomach cancer Neg Hx    Colon cancer Neg Hx    Esophageal  cancer Neg Hx    Rectal cancer Neg Hx     Social History   Socioeconomic History   Marital status: Married    Spouse name: Not on file   Number of children: 1   Years of education: Not on file   Highest education level: Not on file  Occupational History   Not on file  Tobacco Use   Smoking status: Never   Smokeless tobacco: Never  Vaping Use   Vaping status: Never Used  Substance and Sexual Activity   Alcohol use: No   Drug use: No   Sexual activity: Not Currently  Other Topics Concern    Not on file  Social History Narrative   Not on file   Social Determinants of Health   Financial Resource Strain: Medium Risk (10/31/2021)   Overall Financial Resource Strain (CARDIA)    Difficulty of Paying Living Expenses: Somewhat hard  Food Insecurity: No Food Insecurity (10/31/2021)   Hunger Vital Sign    Worried About Running Out of Food in the Last Year: Never true    Ran Out of Food in the Last Year: Never true  Transportation Needs: No Transportation Needs (10/31/2021)   PRAPARE - Administrator, Civil Service (Medical): No    Lack of Transportation (Non-Medical): No  Physical Activity: Not on file  Stress: Not on file  Social Connections: Not on file    Allergies  Allergen Reactions   Aspirin     Intolerance (aspirin sensitive stomach)   Darvocet [Propoxyphene N-Acetaminophen]     Skin reaction on Vaginal area   Erythromycin Nausea And Vomiting   Gabapentin Other (See Comments)    Suicidal ideations    Ibuprofen     Stomach issues   Metformin And Related Nausea Only    Outpatient Medications Prior to Visit  Medication Sig Dispense Refill   acetaminophen (TYLENOL) 500 MG tablet Take 1,000 mg by mouth every 6 (six) hours as needed for mild pain, fever or headache.     alum & mag hydroxide-simeth (MYLANTA MAXIMUM STRENGTH) 400-400-40 MG/5ML suspension Take 5 mLs by mouth every 6 (six) hours as needed for indigestion.     aspirin 81 MG chewable tablet Chew 1 tablet (81 mg total) by mouth daily. 30 tablet 1   atorvastatin (LIPITOR) 80 MG tablet Take 1 tablet (80 mg total) by mouth daily. 90 tablet 1   conjugated estrogens (PREMARIN) vaginal cream Insert 0.5g vaginally nightly for two weeks, then twice a week after 42.5 g 11   Continuous Blood Gluc Receiver (DEXCOM G7 RECEIVER) DEVI Use to check blood sugar 3 times daily. 1 each 0   Continuous Glucose Sensor (DEXCOM G7 SENSOR) MISC Use to check blood sugar 3 times daily. 3 each 6   dapagliflozin propanediol  (FARXIGA) 10 MG TABS tablet Take 1 tablet (10 mg total) by mouth daily. 30 tablet 3   diazepam (VALIUM) 5 MG tablet Insert 1 tablet vaginally nightly as needed for muscle spasm/ pelvic pain. 15 tablet 1   Insulin Pen Needle (PENTIPS) 32G X 4 MM MISC Use as directed with insulin pen 100 each 0   loperamide (IMODIUM A-D) 2 MG tablet Take 1 tablet (2 mg total) by mouth as needed for diarrhea or loose stools. 30 tablet 0   metoprolol succinate (TOPROL-XL) 25 MG 24 hr tablet Take 1 tablet (25 mg total) by mouth daily. Take with or immediately following a meal. 90 tablet 3   Multiple Vitamin (MULTIVITAMIN WITH MINERALS)  TABS tablet Take 1 tablet by mouth daily.     sacubitril-valsartan (ENTRESTO) 49-51 MG Take 1 tablet by mouth 2 (two) times daily. 180 tablet 3   solifenacin (VESICARE) 10 MG tablet Take 1 tablet (10 mg total) by mouth daily. 90 tablet 3   spironolactone (ALDACTONE) 25 MG tablet Take 1 tablet (25 mg total) by mouth daily. 90 tablet 1   vitamin B-12 (CYANOCOBALAMIN) 1000 MCG tablet Take 3,000 mcg by mouth daily.     clopidogrel (PLAVIX) 75 MG tablet Take 1 tablet (75 mg total) by mouth daily. 30 tablet 3   Insulin Lispro Prot & Lispro (HUMALOG MIX 75/25 KWIKPEN) (75-25) 100 UNIT/ML Kwikpen Inject 28 Units into the skin in the morning AND 16 Units every evening. 30 mL 2   No facility-administered medications prior to visit.     ROS Review of Systems  Constitutional:  Negative for activity change and appetite change.  HENT:  Negative for sinus pressure and sore throat.   Respiratory:  Negative for chest tightness, shortness of breath and wheezing.   Cardiovascular:  Negative for chest pain and palpitations.  Gastrointestinal:  Positive for diarrhea. Negative for abdominal distention, abdominal pain, constipation and vomiting.  Genitourinary: Negative.   Musculoskeletal:        See HPI  Neurological:  Positive for weakness and headaches.  Psychiatric/Behavioral:  Negative for  behavioral problems and dysphoric mood.     Objective:  BP 95/60   Pulse 79   Ht 5\' 4"  (1.626 m)   Wt 112 lb 9.6 oz (51.1 kg)   LMP 11/16/2014 (Approximate)   SpO2 100%   BMI 19.33 kg/m      02/23/2023    3:42 PM 02/09/2023   10:17 AM 12/30/2022   12:51 PM  BP/Weight  Systolic BP 95 122 96  Diastolic BP 60 72 63  Wt. (Lbs) 112.6 114   BMI 19.33 kg/m2 19.57 kg/m2       Physical Exam Constitutional:      Appearance: She is well-developed.  HENT:     Right Ear: Tympanic membrane normal.     Left Ear: Tympanic membrane normal.  Cardiovascular:     Rate and Rhythm: Normal rate.     Heart sounds: Murmur heard.  Pulmonary:     Effort: Pulmonary effort is normal.     Breath sounds: Normal breath sounds. No wheezing or rales.  Chest:     Chest wall: No tenderness.  Abdominal:     General: Bowel sounds are normal. There is no distension.     Palpations: Abdomen is soft. There is no mass.     Tenderness: There is no abdominal tenderness.  Musculoskeletal:        General: Normal range of motion.     Right ankle: Swelling present. Tenderness present.     Left ankle: No swelling. No tenderness.  Neurological:     Mental Status: She is alert and oriented to person, place, and time.  Psychiatric:        Mood and Affect: Mood normal.        Latest Ref Rng & Units 07/28/2022   12:55 PM 06/04/2022   10:20 AM 05/01/2022    4:14 PM  CMP  Glucose 70 - 99 mg/dL 409  811  914   BUN 6 - 20 mg/dL 10  8  8    Creatinine 0.44 - 1.00 mg/dL 7.82  9.56  2.13   Sodium 135 - 145 mmol/L 139  145  136   Potassium 3.5 - 5.1 mmol/L 4.5  3.7  3.4   Chloride 98 - 111 mmol/L 104  107  102   CO2 22 - 32 mmol/L 26  27  26    Calcium 8.9 - 10.3 mg/dL 8.9  8.9  9.1     Lipid Panel     Component Value Date/Time   CHOL 147 02/20/2022 1548   CHOL 192 12/13/2015 0856   TRIG 134 02/20/2022 1548   HDL 42 02/20/2022 1548   HDL 41 12/13/2015 0856   CHOLHDL 3.5 02/20/2022 1548   VLDL 27  02/20/2022 1548   LDLCALC 78 02/20/2022 1548   LDLCALC 118 (H) 12/13/2015 0856    CBC    Component Value Date/Time   WBC 11.5 (H) 07/28/2022 1106   RBC 6.23 (H) 07/28/2022 1106   HGB 16.2 (H) 07/28/2022 1106   HCT 51.9 (H) 07/28/2022 1106   PLT 337 07/28/2022 1106   MCV 83.3 07/28/2022 1106   MCH 26.0 07/28/2022 1106   MCHC 31.2 07/28/2022 1106   RDW 14.4 07/28/2022 1106   LYMPHSABS 1.1 10/08/2021 1947   MONOABS 0.7 10/08/2021 1947   EOSABS 0.0 10/08/2021 1947   BASOSABS 0.0 10/08/2021 1947    Lab Results  Component Value Date   HGBA1C 9.9 (A) 02/23/2023    Assessment & Plan:      Diabetes Mellitus:  -Poorly controlled with A1c of 9.9 and fasting glucose of 140-150. Patient currently on 28 units of insulin in the morning and 16 units in the evening. -Increase morning insulin dose to 32 units while maintaining evening dose at 16 units. -Check blood glucose levels at home regularly. -Counseled on Diabetic diet, my plate method, 914 minutes of moderate intensity exercise/week Blood sugar logs with fasting goals of 80-120 mg/dl, random of less than 782 and in the event of sugars less than 60 mg/dl or greater than 956 mg/dl encouraged to notify the clinic. Advised on the need for annual eye exams, annual foot exams, Pneumonia vaccine.   Diarrhea:  -Frequent episodes, previously evaluated by GI specialist. Advised to take Imodium AD. -Start taking Imodium AD as previously advised by GI specialist. -Report effectiveness of Imodium AD to GI specialist during upcoming appointment.  Lumbar Radiculopathy: - Symptoms of leg weakness and pain, scheduled for injections with spine surgeon. -Continue with planned injections. -Report leg weakness to spine surgeon during next appointment.  Headache:  -Frequent headaches occurring 3-4 times a week for several months, not relieved by Tylenol. -Start Effexor for headache prevention. -Continue monitoring headache frequency and  intensity. -Not typical for migraines -Due to cardiac condition unable to place on amitriptyline and due to being underweight Topamax is contraindicated  Ankle Sprain:  -Recent fall resulted in twisted ankle, currently applying ice. -Continue applying ice and elevate ankle. -Apply Voltaren gel for additional anti-inflammatory effect.      CHF: -EF 60 to 65% from echo 04/2022 -Euvolemic -Continue Entresto, spironolactone, SGLT2i     Meds ordered this encounter  Medications   Insulin Lispro Prot & Lispro (HUMALOG MIX 75/25 KWIKPEN) (75-25) 100 UNIT/ML Kwikpen    Sig: Inject 32 Units into the skin in the morning AND 16 Units every evening.    Dispense:  30 mL    Refill:  2    Dose increase   DISCONTD: venlafaxine XR (EFFEXOR XR) 75 MG 24 hr capsule    Sig: Take 1 capsule (75 mg total) by mouth daily with breakfast.    Dispense:  30 capsule    Refill:  3   clopidogrel (PLAVIX) 75 MG tablet    Sig: Take 1 tablet (75 mg total) by mouth daily.    Dispense:  90 tablet    Refill:  1   venlafaxine XR (EFFEXOR XR) 75 MG 24 hr capsule    Sig: Take 1 capsule (75 mg total) by mouth daily with breakfast. For headaches    Dispense:  30 capsule    Refill:  3    Follow-up: Return in about 3 months (around 05/26/2023).       Hoy Register, MD, FAAFP. [facility] [place], [place] [phone]   02/23/2023, 5:52 PM

## 2023-02-24 LAB — CMP14+EGFR
ALT: 20 IU/L (ref 0–32)
AST: 24 IU/L (ref 0–40)
Albumin: 4.1 g/dL (ref 3.8–4.9)
Alkaline Phosphatase: 98 IU/L (ref 44–121)
BUN/Creatinine Ratio: 13 (ref 12–28)
BUN: 11 mg/dL (ref 8–27)
Bilirubin Total: 0.3 mg/dL (ref 0.0–1.2)
CO2: 25 mmol/L (ref 20–29)
Calcium: 9.5 mg/dL (ref 8.7–10.3)
Chloride: 102 mmol/L (ref 96–106)
Creatinine, Ser: 0.87 mg/dL (ref 0.57–1.00)
Globulin, Total: 2.7 g/dL (ref 1.5–4.5)
Glucose: 182 mg/dL — ABNORMAL HIGH (ref 70–99)
Potassium: 3.7 mmol/L (ref 3.5–5.2)
Sodium: 142 mmol/L (ref 134–144)
Total Protein: 6.8 g/dL (ref 6.0–8.5)
eGFR: 76 mL/min/{1.73_m2} (ref >59)

## 2023-02-25 ENCOUNTER — Other Ambulatory Visit: Payer: Self-pay

## 2023-03-01 ENCOUNTER — Other Ambulatory Visit: Payer: Self-pay

## 2023-03-02 DIAGNOSIS — M5416 Radiculopathy, lumbar region: Secondary | ICD-10-CM | POA: Diagnosis not present

## 2023-03-10 ENCOUNTER — Other Ambulatory Visit: Payer: Self-pay

## 2023-03-12 ENCOUNTER — Other Ambulatory Visit: Payer: Self-pay

## 2023-03-23 ENCOUNTER — Encounter (HOSPITAL_COMMUNITY)
Admission: RE | Disposition: A | Discharge status: Home / Self Care | Payer: Self-pay | Source: Home / Self Care | Attending: Gastroenterology

## 2023-03-23 ENCOUNTER — Ambulatory Visit (HOSPITAL_COMMUNITY)
Admission: RE | Admit: 2023-03-23 | Discharge: 2023-03-23 | Disposition: A | Discharge status: Home / Self Care | Payer: Medicaid Other | Attending: Gastroenterology | Admitting: Gastroenterology

## 2023-03-23 ENCOUNTER — Encounter (HOSPITAL_COMMUNITY): Payer: Self-pay | Admitting: Gastroenterology

## 2023-03-23 DIAGNOSIS — R1319 Other dysphagia: Secondary | ICD-10-CM | POA: Diagnosis not present

## 2023-03-23 DIAGNOSIS — R1310 Dysphagia, unspecified: Secondary | ICD-10-CM | POA: Insufficient documentation

## 2023-03-23 HISTORY — PX: ESOPHAGEAL MANOMETRY: SHX5429

## 2023-03-23 SURGERY — MANOMETRY, ESOPHAGUS

## 2023-03-23 MED ORDER — LIDOCAINE VISCOUS HCL 2 % MT SOLN
OROMUCOSAL | Status: AC
  Filled 2023-03-23: qty 15

## 2023-03-23 SURGICAL SUPPLY — 2 items
FACESHIELD LNG OPTICON STERILE (SAFETY) IMPLANT
GLOVE BIO SURGEON STRL SZ8 (GLOVE) ×4 IMPLANT

## 2023-03-23 NOTE — Progress Notes (Signed)
Esophageal Manometry done per protocol. Patient tolerated well without distress or complication.  

## 2023-03-25 ENCOUNTER — Encounter: Payer: Self-pay | Admitting: Physician Assistant

## 2023-03-25 ENCOUNTER — Ambulatory Visit: Payer: Self-pay

## 2023-03-25 ENCOUNTER — Ambulatory Visit: Payer: Medicaid Other | Admitting: Physician Assistant

## 2023-03-25 ENCOUNTER — Other Ambulatory Visit: Payer: Self-pay

## 2023-03-25 VITALS — BP 133/74 | HR 79 | Ht 64.5 in | Wt 109.0 lb

## 2023-03-25 DIAGNOSIS — S99922A Unspecified injury of left foot, initial encounter: Secondary | ICD-10-CM | POA: Diagnosis not present

## 2023-03-25 DIAGNOSIS — L03032 Cellulitis of left toe: Secondary | ICD-10-CM | POA: Diagnosis not present

## 2023-03-25 MED ORDER — DOXYCYCLINE HYCLATE 100 MG PO TABS
100.0000 mg | ORAL_TABLET | Freq: Two times a day (BID) | ORAL | 0 refills | Status: DC
Start: 2023-03-25 — End: 2023-04-06
  Filled 2023-03-25: qty 20, 10d supply, fill #0

## 2023-03-25 NOTE — Telephone Encounter (Signed)
Spoke with patient . Verified name & DOB    Advised that since she is a diabetic that she should go to our [facility]. Patient voiced that she was trying to make up her mind weather to go. Voiced that she is going. Hours of operation discussed. Patient voiced that she had been given directions by the first person she had spoken to and she was getting ready to leave to go the [facility] right now.

## 2023-03-25 NOTE — Progress Notes (Signed)
Established Patient Office Visit  Subjective   Patient ID: Brooke [name], female    DOB: 1961-06-01  Age: 62 y.o. MRN: [id number]  Chief Complaint  Patient presents with   left toe pain     States that she hit her left great toe on the bed frame approximately 1 week ago.  States that it just started hurting yesterday, states that she did notice that it has become red and swollen.  States that she put some ice on it without relief.  States that she put a bandaid on it prior to coming to [facility], states that blister was not present prior to that   No recent antibiotic use.    Past Medical History:  Diagnosis Date   Abdominal pain, left lower quadrant 12/17/2015   Chest pain, unspecified 10/05/2003   CHF (congestive heart failure) (HCC)    Depressive disorder, not elsewhere classified 07/31/2005   Diabetes mellitus without complication (HCC) 09/08/2006   Endometriosis 09/14/2012   Hypertension 11/23/2003   Unspecified constipation 06/24/2004   Social History   Socioeconomic History   Marital status: Married    Spouse name: Not on file   Number of children: 1   Years of education: Not on file   Highest education level: Not on file  Occupational History   Not on file  Tobacco Use   Smoking status: Never   Smokeless tobacco: Never  Vaping Use   Vaping status: Never Used  Substance and Sexual Activity   Alcohol use: No   Drug use: No   Sexual activity: Not Currently  Other Topics Concern   Not on file  Social History Narrative   Not on file   Social Determinants of Health   Financial Resource Strain: Medium Risk (10/31/2021)   Overall Financial Resource Strain (CARDIA)    Difficulty of Paying Living Expenses: Somewhat hard  Food Insecurity: No Food Insecurity (10/31/2021)   Hunger Vital Sign    Worried About Running Out of Food in the Last Year: Never true    Ran Out of Food in the Last Year: Never true  Transportation Needs: No Transportation Needs (10/31/2021)    PRAPARE - Administrator, Civil Service (Medical): No    Lack of Transportation (Non-Medical): No  Physical Activity: Not on file  Stress: Not on file  Social Connections: Not on file  Intimate Partner Violence: Not on file   Family History  Problem Relation Age of Onset   Diabetes Mother    Hypertension Mother    Arthritis Mother        RA   Pancreatic cancer Father    Stomach cancer Neg Hx    Colon cancer Neg Hx    Esophageal cancer Neg Hx    Rectal cancer Neg Hx    Allergies  Allergen Reactions   Aspirin     Intolerance (aspirin sensitive stomach)   Darvocet [Propoxyphene N-Acetaminophen]     Skin reaction on Vaginal area   Erythromycin Nausea And Vomiting   Gabapentin Other (See Comments)    Suicidal ideations    Ibuprofen     Stomach issues   Metformin And Related Nausea Only    Review of Systems  Constitutional:  Negative for chills and fever.  HENT: Negative.    Eyes: Negative.   Respiratory:  Negative for shortness of breath.   Cardiovascular:  Negative for chest pain.  Gastrointestinal: Negative.   Genitourinary: Negative.   Musculoskeletal: Negative.   Skin: Negative.  Neurological: Negative.   Endo/Heme/Allergies: Negative.   Psychiatric/Behavioral: Negative.        Objective:     BP 133/74 (BP Location: Left Arm, Patient Position: Sitting, Cuff Size: Normal)   Pulse 79   Ht 5' 4.5" (1.638 m)   Wt 109 lb (49.4 kg)   LMP 11/16/2014 (Approximate)   SpO2 100%   BMI 18.42 kg/m  BP Readings from Last 3 Encounters:  03/25/23 133/74  02/23/23 95/60  02/09/23 122/72   Wt Readings from Last 3 Encounters:  03/25/23 109 lb (49.4 kg)  02/23/23 112 lb 9.6 oz (51.1 kg)  02/09/23 114 lb (51.7 kg)      Physical Exam Vitals and nursing note reviewed.  Constitutional:      Appearance: Normal appearance.  HENT:     Head: Normocephalic and atraumatic.     Right Ear: External ear normal.     Left Ear: External ear normal.     Nose:  Nose normal.     Mouth/Throat:     Mouth: Mucous membranes are moist.     Pharynx: Oropharynx is clear.  Eyes:     Extraocular Movements: Extraocular movements intact.     Conjunctiva/sclera: Conjunctivae normal.     Pupils: Pupils are equal, round, and reactive to light.  Cardiovascular:     Rate and Rhythm: Normal rate and regular rhythm.     Pulses: Normal pulses.     Heart sounds: Normal heart sounds.  Pulmonary:     Effort: Pulmonary effort is normal.     Breath sounds: Normal breath sounds.  Musculoskeletal:        General: Normal range of motion.     Cervical back: Normal range of motion and neck supple.  Feet:     Comments: Toenail lifts slightly off of nailbed, non-purulent, see photos Neurological:     General: No focal deficit present.     Mental Status: She is alert and oriented to person, place, and time.  Psychiatric:        Mood and Affect: Mood normal.        Behavior: Behavior normal.        Thought Content: Thought content normal.        Judgment: Judgment normal.             Assessment & Plan:   Problem List Items Addressed This Visit   None Visit Diagnoses     Injury of toenail of left foot, initial encounter    -  Primary   Relevant Orders   Ambulatory referral to Podiatry   Infection of nailbed of toe of left foot       Relevant Medications   doxycycline (VIBRA-TABS) 100 MG tablet     1. Injury of toenail of left foot, initial encounter A1C 02/23/23 was 9.9.  Trial doxycycline, refer to podiatry for further evaluation.  Toe wrapped by CMA. Patient strongly encouraged to keep area clean and dry.  Patient education given on other supportive care, red flags given for prompt reevaluation. - Ambulatory referral to Podiatry  2. Infection of nailbed of toe of left foot  - doxycycline (VIBRAMYCIN) 100 MG capsule; Take 1 capsule (100 mg total) by mouth 2 (two) times daily.  Dispense: 20 capsule; Refill: 0  No AVS was created, no printer supplies  available.  Patient declines myChart Patient education given through teach back method.   I have reviewed the patient's medical history (PMH, PSH, Social History, Family History, Medications, and allergies) ,  and have been updated if relevant. I spent 21 minutes reviewing chart and  face to face time with patient.    Return if symptoms worsen or fail to improve.    Kasandra Knudsen Mayers, PA-C

## 2023-03-25 NOTE — Telephone Encounter (Signed)
Chief Complaint: Toe nail coming off Symptoms: Bruising, painful only when touched Frequency: Noticed nail coming off today, possibly hit toe on Tuesday Pertinent Negatives: Patient denies other symptoms Disposition: [] ED /[] Urgent Care (no appt availability in office) / [] Appointment(In office/virtual)/ []  [facility]/ [] Home Care/ [] Refused Recommended Disposition /[x] [facility]/ []  Follow-up with PCP Additional Notes: Patient is not sure of injury to toe, but noticed a bruise yesterday and nail lifted on the left great toe today. Advised no availability in the office until 04/07/23, advised [facility] today, she agrees to go.   Summary: toe nail coming off   Pt states her big toe on her left foot, she hit something or her 75lb dog hit her foot and it was bruised yesterday and now today the toe nail is starting to come off. Pt is wanting to know what she should do. Please advise.     Reason for Disposition  [1] Toenail comes off or is almost off AND [2] follows old injury AND [3] wants doctor to remove it  Answer Assessment - Initial Assessment Questions 1. MECHANISM: "How did the injury happen?"      Hit it on something 2. ONSET: "When did the injury happen?" (Minutes or hours ago)      Possibly Tuesday night 3. LOCATION: "What part of the toe is injured?" "Is the nail damaged?"      Top of the left great toe is bruised, nail is coming up 4. APPEARANCE of TOE INJURY: "What does the injury look like?"      Bruise 5. SEVERITY: "Can you use the foot normally?" "Can you walk?"      Yes 6. SIZE: For cuts, bruises, or swelling, ask: "How large is it?" (e.g., inches or centimeters;  entire toe)      Just the great toe around the toenail and on the bottom 7. PAIN: "Is there pain?" If Yes, ask: "How bad is the pain?"   (e.g., Scale 1-10; or mild, moderate, severe)     Only when touched 9. DIABETES: "Do you have a history of diabetes or poor circulation in the feet?"      Yes 10. OTHER SYMPTOMS: "Do you have any other symptoms?"        No  Protocols used: Toe Injury-A-AH

## 2023-03-29 ENCOUNTER — Encounter (HOSPITAL_COMMUNITY): Payer: Self-pay | Admitting: Gastroenterology

## 2023-04-01 DIAGNOSIS — G8929 Other chronic pain: Secondary | ICD-10-CM | POA: Diagnosis not present

## 2023-04-01 DIAGNOSIS — M542 Cervicalgia: Secondary | ICD-10-CM | POA: Diagnosis not present

## 2023-04-01 DIAGNOSIS — M4312 Spondylolisthesis, cervical region: Secondary | ICD-10-CM | POA: Diagnosis not present

## 2023-04-01 DIAGNOSIS — M5416 Radiculopathy, lumbar region: Secondary | ICD-10-CM | POA: Diagnosis not present

## 2023-04-01 DIAGNOSIS — M50223 Other cervical disc displacement at C6-C7 level: Secondary | ICD-10-CM | POA: Diagnosis not present

## 2023-04-01 DIAGNOSIS — M47812 Spondylosis without myelopathy or radiculopathy, cervical region: Secondary | ICD-10-CM | POA: Diagnosis not present

## 2023-04-06 ENCOUNTER — Ambulatory Visit (INDEPENDENT_AMBULATORY_CARE_PROVIDER_SITE_OTHER): Payer: Medicaid Other

## 2023-04-06 ENCOUNTER — Ambulatory Visit: Payer: Medicaid Other | Admitting: Podiatry

## 2023-04-06 ENCOUNTER — Other Ambulatory Visit: Payer: Self-pay

## 2023-04-06 DIAGNOSIS — E0852 Diabetes mellitus due to underlying condition with diabetic peripheral angiopathy with gangrene: Secondary | ICD-10-CM

## 2023-04-06 DIAGNOSIS — Z794 Long term (current) use of insulin: Secondary | ICD-10-CM | POA: Diagnosis not present

## 2023-04-06 DIAGNOSIS — L03032 Cellulitis of left toe: Secondary | ICD-10-CM | POA: Diagnosis not present

## 2023-04-06 DIAGNOSIS — L089 Local infection of the skin and subcutaneous tissue, unspecified: Secondary | ICD-10-CM

## 2023-04-06 MED ORDER — AMOXICILLIN-POT CLAVULANATE 875-125 MG PO TABS
1.0000 | ORAL_TABLET | Freq: Two times a day (BID) | ORAL | 0 refills | Status: DC
  Filled 2023-04-06: qty 14, 7d supply, fill #0

## 2023-04-06 NOTE — Addendum Note (Signed)
Addended by: Daryel November on: 04/06/2023 04:12 PM   Modules accepted: Orders

## 2023-04-06 NOTE — Addendum Note (Signed)
Addended by: Daryel November on: 04/06/2023 04:14 PM   Modules accepted: Orders

## 2023-04-06 NOTE — Progress Notes (Signed)
  Subjective:  Patient ID: Brooke [name], female    DOB: 1960-09-01,  MRN: [id number]  Chief Complaint  Patient presents with   Nail Problem    " My big toenail is getting to come off"    62 y.o. female presents with the above complaint. History confirmed with patient.   Objective:  Physical Exam: normal sensory exam and feet are cool to touch, unable to palpate pulses directly, capillary fill time is intact, near complete traumatic avulsion of the left hallux nail with some erythema and signs of infection, small ulceration over IPJ with edema and erythema.   Radiographs: Multiple views x-ray of the left foot: no fracture, dislocation, swelling or degenerative changes noted and no soft tissue emphysema, no signs of osteomyelitis Assessment:   1. Diabetes mellitus due to underlying condition with diabetic peripheral angiopathy and gangrene, with long-term current use of insulin (HCC)   2. Paronychia of toe of left foot   3. Diabetic infection of left foot (HCC)      Plan:  Patient was evaluated and treated and all questions answered.  She has had a near complete traumatic avulsion of the left hallux nail and has developed a paronychia around the nailbed.  I recommended avulsion of the nail plate today.  She has a small area proximal to this that was concerning for an abscess or ulceration forming over the IPJ.  Following a digital block with lidocaine and Marcaine and prepped with Betadine a tourniquet was secured around the base of the toe and the hallux nail plate was avulsed.  There is no purulent drainage from this or laceration or extension to bone.  There is a small area of ulceration proximal to this that I debrided of nonviable tissue, no abscess was present here as well but a culture was taken.  We will keep her on antibiotics and Rx for 7 days of Augmentin was sent to the pharmacy.  Post care instructions for the toe were given.  I have also recommended ABI and evaluation with  noninvasive vascular testing due to her history and uncontrolled diabetes and lack of palpable pulses.  I will see her back in 3 weeks to reevaluate.  Return in about 3 weeks (around 04/27/2023) for nail re-check, wound care.

## 2023-04-06 NOTE — Patient Instructions (Signed)
Soak Instructions ° ° ° °THE DAY AFTER THE PROCEDURE ° °Place 1/4 cup of epsom salts (or betadine, or white vinegar) in a quart of warm tap water.  Submerge your foot or feet with outer bandage intact for the initial soak; this will allow the bandage to become moist and wet for easy lift off.  Once you remove your bandage, continue to soak in the solution for 20 minutes.  This soak should be done twice a day.  Next, remove your foot or feet from solution, blot dry the affected area and cover.  You may use a band aid large enough to cover the area or use gauze and tape.  Apply other medications to the area as directed by the doctor such as polysporin neosporin. ° °IF YOUR SKIN BECOMES IRRITATED WHILE USING THESE INSTRUCTIONS, IT IS OKAY TO SWITCH TO  WHITE VINEGAR AND WATER. Or you may use antibacterial soap and water to keep the toe clean ° °Monitor for any signs/symptoms of infection. Call the office immediately if any occur or go directly to the emergency room. Call with any questions/concerns. ° ° °

## 2023-04-07 ENCOUNTER — Encounter: Payer: Self-pay | Admitting: Cardiology

## 2023-04-07 ENCOUNTER — Telehealth: Payer: Self-pay

## 2023-04-07 ENCOUNTER — Ambulatory Visit: Payer: Medicaid Other | Attending: Cardiology | Admitting: Cardiology

## 2023-04-07 VITALS — BP 128/72 | HR 81 | Ht 64.5 in | Wt 108.2 lb

## 2023-04-07 DIAGNOSIS — I2510 Atherosclerotic heart disease of native coronary artery without angina pectoris: Secondary | ICD-10-CM | POA: Diagnosis not present

## 2023-04-07 DIAGNOSIS — I5022 Chronic systolic (congestive) heart failure: Secondary | ICD-10-CM | POA: Diagnosis not present

## 2023-04-07 LAB — UNLABELED: Test Ordered On Req: 18881

## 2023-04-07 NOTE — Progress Notes (Signed)
  Cardiology Office Note:  .   Date:  04/07/2023  ID:  Brooke [name], DOB 1961-01-12, MRN [id number] PCP: Hoy Register, MD  [facility] Providers Cardiologist:  Donato Schultz, MD    History of Present Illness: .   Brooke [name] is a 62 y.o. female Discussed the use of AI scribe software for clinical note transcription with the patient, who gave verbal consent to proceed.  History of Present Illness   The patient, with a history of coronary artery disease non operable multivessel disease and systolic heart failure, presents with esophageal dysphagia and a cough. She reports having to break food into small pieces to swallow and has undergone recent testing for this issue. The patient also mentions a persistent cough that sometimes leads to chest discomfort, which she attributes to the muscular effort of coughing.   EF has normalized   The patient's heart failure has improved with medication, and she reports no current chest pain. She has been maintaining a stable weight and shows no signs of fluid retention.   The patient is also trying to increase her physical activity, including climbing stairs in the usual manner and doing yard work. She expresses a desire to continue moving and staying active.       ROS: No CP  Studies Reviewed: Marland Kitchen        LABS Creatinine: 0.87 (02/2023) ALT: 20 (02/2023) LDL cholesterol: 78 (02/2023)  DIAGNOSTIC Echocardiogram 05/01/22: EF normal, No mitral regurgitation Risk Assessment/Calculations:            Physical Exam:   VS:  BP 128/72   Pulse 81   Ht 5' 4.5" (1.638 m)   Wt 108 lb 3.2 oz (49.1 kg)   LMP 11/16/2014 (Approximate)   SpO2 98%   BMI 18.29 kg/m    Wt Readings from Last 3 Encounters:  04/07/23 108 lb 3.2 oz (49.1 kg)  03/25/23 109 lb (49.4 kg)  02/23/23 112 lb 9.6 oz (51.1 kg)    GEN: Thin in no acute distress NECK: No JVD; No carotid bruits CARDIAC: RRR, no murmurs, rubs, gallops RESPIRATORY:  Clear to auscultation  without rales, wheezing or rhonchi  ABDOMEN: Soft, non-tender, non-distended EXTREMITIES:  No edema; No deformity   ASSESSMENT AND PLAN: .    Assessment and Plan    Esophageal Dysphagia Difficulty swallowing with food getting stuck. Recent esophageal dilation performed. -Continue current management as per gastroenterologist.  Chronic Cough Likely secondary to esophageal dysphagia. No other associated symptoms. -Continue current management and monitor for changes.  Coronary Artery Disease and Systolic Heart Failure Stable with improvement in heart function on current medications. No chest pain or fluid retention. -Continue Aspirin 81mg  daily, Plavix 75mg  daily, Atorvastatin 80mg  daily, Metoprolol 25mg  daily, Entresto 49/51mg  daily, and Spironolactone. Blood work reviewed and normal.  -Continue monitoring weight and symptoms at home.  Hyperlipidemia LDL slightly above goal at 78. -Continue Atorvastatin 80mg  daily and monitor lipid panel periodically.  General Health Maintenance -Encouraged continued physical activity as tolerated. -Consider Vitamin D supplementation if needed. -Follow-up in 6 months with APP and in 1 year with cardiologist.             Signed, Donato Schultz, MD

## 2023-04-07 NOTE — Patient Instructions (Signed)
Medication Instructions:  The current medical regimen is effective;  continue present plan and medications.  *If you need a refill on your cardiac medications before your next appointment, please call your pharmacy*  Follow-Up: At [facility], you and your health needs are our priority.  As part of our continuing mission to provide you with exceptional heart care, we have created designated Provider Care Teams.  These Care Teams include your primary Cardiologist (physician) and Advanced Practice Providers (APPs -  Physician Assistants and Nurse Practitioners) who all work together to provide you with the care you need, when you need it.  We recommend signing up for the patient portal called "MyChart".  Sign up information is provided on this After Visit Summary.  MyChart is used to connect with patients for Virtual Visits (Telemedicine).  Patients are able to view lab/test results, encounter notes, upcoming appointments, etc.  Non-urgent messages can be sent to your provider as well.   To learn more about what you can do with MyChart, go to [email or web address].    Your next appointment:   6 month(s)  Provider:   Jari Favre, PA-C, Robin Searing, NP, Eligha Bridegroom, NP, Tereso Newcomer, PA-C, or Perlie Gold, PA-C     Then, Donato Schultz, MD will plan to see you again in 1 year(s).

## 2023-04-08 ENCOUNTER — Other Ambulatory Visit: Payer: Self-pay

## 2023-04-09 DIAGNOSIS — R1319 Other dysphagia: Secondary | ICD-10-CM

## 2023-04-15 ENCOUNTER — Other Ambulatory Visit: Payer: Self-pay

## 2023-04-15 ENCOUNTER — Other Ambulatory Visit: Payer: Self-pay | Admitting: Family Medicine

## 2023-04-15 MED ORDER — DAPAGLIFLOZIN PROPANEDIOL 10 MG PO TABS
10.0000 mg | ORAL_TABLET | Freq: Every day | ORAL | 3 refills | Status: DC
  Filled 2023-04-15: qty 30, 30d supply, fill #0
  Filled 2023-05-18: qty 30, 30d supply, fill #1
  Filled 2023-07-07: qty 30, 30d supply, fill #2

## 2023-04-16 ENCOUNTER — Other Ambulatory Visit: Payer: Self-pay

## 2023-04-20 ENCOUNTER — Other Ambulatory Visit: Payer: Self-pay

## 2023-04-21 LAB — WOUND CULTURE
MICRO NUMBER:: 15445376
SPECIMEN QUALITY:: ADEQUATE

## 2023-04-21 LAB — PAT ID TIQ DOC: Test Affected: 18881

## 2023-04-27 ENCOUNTER — Ambulatory Visit: Payer: Medicaid Other | Admitting: Podiatry

## 2023-04-27 DIAGNOSIS — L03032 Cellulitis of left toe: Secondary | ICD-10-CM

## 2023-04-27 NOTE — Patient Instructions (Signed)
Call [phone] to schedule your vascular testing:  [facility] [address] [place], [place] [address]

## 2023-04-27 NOTE — Progress Notes (Signed)
Subjective:  Patient ID: Brooke [name], female    DOB: [birth year],  MRN: [id number]  Chief Complaint  Patient presents with   Nail Problem    Pt presents today for     62 y.o. female presents with the above complaint. History confirmed with patient.  She feels it is doing better she has completed her antibiotics  Objective:  Physical Exam: warm, good capillary refill, no trophic changes or ulcerative lesions, nonpalpable DP and PT pulses, normal sensory exam, and healing nail avulsion of left hallux, small scab present at previous wound site, erythema greatly improved no active drainage.    Assessment:   1. Paronychia of toe of left foot      Plan:  Patient was evaluated and treated and all questions answered.  Doing better and infection has resolved.  She does have some slow healing occurring not at the avulsion site but at the previous wound site.  I recommended she have her vascular testing scheduled and gave her the number for this.  Referral was previously sent.  I will see her back in 1 month for follow-up.  Return in about 1 month (around 05/27/2023).

## 2023-05-06 ENCOUNTER — Other Ambulatory Visit: Payer: Self-pay

## 2023-05-06 ENCOUNTER — Other Ambulatory Visit: Payer: Self-pay | Admitting: Family Medicine

## 2023-05-06 MED ORDER — ENTRESTO 49-51 MG PO TABS
1.0000 | ORAL_TABLET | Freq: Two times a day (BID) | ORAL | 3 refills | Status: DC
  Filled 2023-05-06: qty 180, 90d supply, fill #0
  Filled 2023-11-17: qty 180, 90d supply, fill #1

## 2023-05-07 ENCOUNTER — Other Ambulatory Visit: Payer: Self-pay

## 2023-05-14 ENCOUNTER — Telehealth: Payer: Self-pay

## 2023-05-14 NOTE — Telephone Encounter (Signed)
Referral and office note faxed to [facility] Fax [phone], confirmation received

## 2023-05-19 ENCOUNTER — Other Ambulatory Visit: Payer: Self-pay

## 2023-05-21 ENCOUNTER — Other Ambulatory Visit: Payer: Self-pay

## 2023-05-21 ENCOUNTER — Telehealth: Payer: Self-pay

## 2023-05-21 NOTE — Telephone Encounter (Signed)
Pharmacy Patient Advocate Encounter   Received notification from CoverMyMeds that prior authorization for [facility] G7 SENSORS is required/requested.   Insurance verification completed.   The patient is insured through [facility] .   Per test claim: PA required; PA submitted to [facility] via CoverMyMeds Key/confirmation #/[id number] Status is pending

## 2023-05-26 ENCOUNTER — Ambulatory Visit: Payer: Self-pay | Admitting: Family Medicine

## 2023-05-27 ENCOUNTER — Ambulatory Visit: Payer: Medicaid Other | Admitting: Podiatry

## 2023-05-27 ENCOUNTER — Encounter: Payer: Self-pay | Admitting: Podiatry

## 2023-05-27 DIAGNOSIS — L03032 Cellulitis of left toe: Secondary | ICD-10-CM

## 2023-05-27 NOTE — Progress Notes (Signed)
  Subjective:  Patient ID: Brooke [name], female    DOB: 1960/10/23,  MRN: [id number]  Chief Complaint  Patient presents with   Nail Problem    Pt states still some slight pain in greater toe    62 y.o. female presents with the above complaint. History confirmed with patient.  Overall says it is doing much better  Objective:  Physical Exam: warm, good capillary refill, no trophic changes or ulcerative lesions, nonpalpable DP and PT pulses, normal sensory exam, and nail avulsion site is well-healed scab and ulcer and erythema has resolved    Assessment:   1. Paronychia of toe of left foot       Plan:  Patient was evaluated and treated and all questions answered.  Doing quite well.  No issues with recurrent infection or nonhealing.  Her vascular testing referral has been faxed and she will call to schedule at her convenience.  Return as me as needed if this returns or worsens  Return if symptoms worsen or fail to improve.

## 2023-06-03 DIAGNOSIS — M5416 Radiculopathy, lumbar region: Secondary | ICD-10-CM | POA: Diagnosis not present

## 2023-06-11 ENCOUNTER — Other Ambulatory Visit: Payer: Self-pay | Admitting: Family Medicine

## 2023-06-11 ENCOUNTER — Other Ambulatory Visit: Payer: Self-pay

## 2023-06-11 DIAGNOSIS — E1165 Type 2 diabetes mellitus with hyperglycemia: Secondary | ICD-10-CM

## 2023-06-11 MED ORDER — ATORVASTATIN CALCIUM 80 MG PO TABS
80.0000 mg | ORAL_TABLET | Freq: Every day | ORAL | 0 refills | Status: DC
  Filled 2023-06-11: qty 90, 90d supply, fill #0

## 2023-06-11 NOTE — Telephone Encounter (Signed)
Requested medication (s) are due for refill today: Yes  Requested medication (s) are on the active medication list: Yes  Last refill:  11/17/22  Future visit scheduled: No  Notes to clinic:  Unable to refill per protocol due to failed labs, no updated results.      Requested Prescriptions  Pending Prescriptions Disp Refills   atorvastatin (LIPITOR) 80 MG tablet 90 tablet 1    Sig: Take 1 tablet (80 mg total) by mouth daily.     Cardiovascular:  Antilipid - Statins Failed - 06/11/2023  9:04 AM      Failed - Lipid Panel in normal range within the last 12 months    Cholesterol, Total  Date Value Ref Range Status  12/13/2015 192 100 - 199 mg/dL Final   Cholesterol  Date Value Ref Range Status  02/20/2022 147 0 - 200 mg/dL Final   LDL Calculated  Date Value Ref Range Status  12/13/2015 118 (H) 0 - 99 mg/dL Final   LDL Cholesterol  Date Value Ref Range Status  02/20/2022 78 0 - 99 mg/dL Final    Comment:           Total Cholesterol/HDL:CHD Risk Coronary Heart Disease Risk Table                     Men   Women  1/2 Average Risk   3.4   3.3  Average Risk       5.0   4.4  2 X Average Risk   9.6   7.1  3 X Average Risk  23.4   11.0        Use the calculated Patient Ratio above and the CHD Risk Table to determine the patient's CHD Risk.        ATP III CLASSIFICATION (LDL):  <100     mg/dL   Optimal  161-096  mg/dL   Near or Above                    Optimal  130-159  mg/dL   Borderline  045-409  mg/dL   High  >811     mg/dL   Very High Performed at [facility], [address] [place], [place] [address]    HDL  Date Value Ref Range Status  02/20/2022 42 >40 mg/dL Final  29/56/2130 41 >86 mg/dL Final   Triglycerides  Date Value Ref Range Status  02/20/2022 134 <150 mg/dL Final         Passed - Patient is not pregnant      Passed - Valid encounter within last 12 months    Recent Outpatient Visits           3 months ago Type 2 diabetes mellitus with  hyperglycemia, with long-term current use of insulin (HCC)   [facility] [place], [place], MD   6 months ago Type 2 diabetes mellitus with hyperglycemia, with long-term current use of insulin (HCC)   [place] [place], [place], MD   9 months ago Type 2 diabetes mellitus with hyperglycemia, with long-term current use of insulin [facility])   [facility] [place], [place], MD   11 months ago Type 2 diabetes mellitus with hyperglycemia, with long-term current use of insulin [facility])   [facility] [place], Almyra L, RPH-CPP   1 year ago Type 2 diabetes mellitus with  hyperglycemia, with long-term current use of insulin [facility])   [facility] [place], Cornelius Moras, RPH-CPP       Future Appointments             In 3 weeks Cordelia Pen, Joan Mayans, NP [facility] at [facility], [facility]   In 4 months Swinyer, Zachary George, NP [facility] at [facility], [facility]

## 2023-06-14 ENCOUNTER — Other Ambulatory Visit: Payer: Self-pay

## 2023-06-21 ENCOUNTER — Ambulatory Visit: Payer: Self-pay

## 2023-06-21 ENCOUNTER — Ambulatory Visit (HOSPITAL_COMMUNITY)
Admission: RE | Admit: 2023-06-21 | Discharge: 2023-06-21 | Disposition: A | Discharge status: Home / Self Care | Payer: Medicaid Other | Source: Ambulatory Visit | Attending: Emergency Medicine | Admitting: Emergency Medicine

## 2023-06-21 ENCOUNTER — Ambulatory Visit (HOSPITAL_COMMUNITY)
Admission: RE | Admit: 2023-06-21 | Discharge: 2023-06-21 | Disposition: A | Discharge status: Home / Self Care | Payer: Medicaid Other | Source: Ambulatory Visit | Attending: Podiatry | Admitting: Podiatry

## 2023-06-21 ENCOUNTER — Encounter (HOSPITAL_COMMUNITY): Payer: Self-pay

## 2023-06-21 VITALS — BP 91/61 | HR 96 | Temp 98.6°F | Resp 18

## 2023-06-21 DIAGNOSIS — E1152 Type 2 diabetes mellitus with diabetic peripheral angiopathy with gangrene: Secondary | ICD-10-CM | POA: Insufficient documentation

## 2023-06-21 DIAGNOSIS — E0852 Diabetes mellitus due to underlying condition with diabetic peripheral angiopathy with gangrene: Secondary | ICD-10-CM

## 2023-06-21 DIAGNOSIS — Z794 Long term (current) use of insulin: Secondary | ICD-10-CM | POA: Insufficient documentation

## 2023-06-21 DIAGNOSIS — N3001 Acute cystitis with hematuria: Secondary | ICD-10-CM

## 2023-06-21 LAB — POCT URINALYSIS DIP (MANUAL ENTRY)
Bilirubin, UA: NEGATIVE
Glucose, UA: >=1000 mg/dL — AB
Ketones, POC UA: NEGATIVE mg/dL
Nitrite, UA: NEGATIVE
Protein Ur, POC: 30 mg/dL — AB
Spec Grav, UA: 1.015 (ref 1.010–1.025)
Urobilinogen, UA: 0.2 U/dL
pH, UA: 5 (ref 5.0–8.0)

## 2023-06-21 LAB — VAS US PAD ABI
Left ABI: 0.99
Right ABI: 0.57

## 2023-06-21 MED ORDER — NITROFURANTOIN MONOHYD MACRO 100 MG PO CAPS: 100.0000 mg | ORAL_CAPSULE | Freq: Two times a day (BID) | ORAL | 0 refills | Status: AC

## 2023-06-21 MED ORDER — NITROFURANTOIN MONOHYD MACRO 100 MG PO CAPS
100.0000 mg | ORAL_CAPSULE | Freq: Two times a day (BID) | ORAL | 0 refills | Status: DC
  Filled 2023-06-21: qty 10, 5d supply, fill #0

## 2023-06-21 NOTE — ED Triage Notes (Signed)
Pt presents to office for frequent urination x 1-2 weeks.

## 2023-06-21 NOTE — Telephone Encounter (Signed)
Chief Complaint: Urinary Frequency Symptoms: increased urinary frequency, bladder pressure, pain with urination, right side flank pain  Frequency: constant x 2-3 weeks  Pertinent Negatives: Patient denies fever, blood in urine, vaginal discharge  Disposition: [] ED /[x] Urgent Care (no appt availability in office) / [] Appointment(In office/virtual)/ []  [facility]/ [] Home Care/ [] Refused Recommended Disposition /[] [facility]/ []  Follow-up with PCP Additional Notes: Patient states she has had an increase in frequency and feels pressure in the bladder. Patient also stated she has some pain with urination and the urine is cloudy. Care advice was given and no appointments available in office this week. Patient has been scheduled at urgent care today at 1830.   Summary: possible UTI/yeast infection   Patient called stated she thinks she has an UTI and yeast infection. She wants to see if provider would call in something for her symptoms. Please f/u with patient     Reason for Disposition  Urinating more frequently than usual (i.e., frequency)  Answer Assessment - Initial Assessment Questions 1. SYMPTOM: "What's the main symptom you're concerned about?" (e.g., frequency, incontinence)     Frequency 2. ONSET: "When did the  frequency  start?"     2-3 weeks ago  3. PAIN: "Is there any pain?" If Yes, ask: "How bad is it?" (Scale: 1-10; mild, moderate, severe)     Moderate  4. CAUSE: "What do you think is causing the symptoms?"     Maybe a UTI 5. OTHER SYMPTOMS: "Do you have any other symptoms?" (e.g., blood in urine, fever, flank pain, pain with urination)     Cloudy, right side flank pain, pain with urination  Protocols used: Urinary Symptoms-A-AH

## 2023-06-21 NOTE — ED Provider Notes (Signed)
[facility]    CSN: [id number] Arrival date & time: 06/21/23  1755    HISTORY   Chief Complaint  Patient presents with   Urinary Frequency    Entered by patient   HPI Brooke [name] is a pleasant, 62 y.o. female who presents to urgent care today.  Patient complains of a 2-week history of increased frequency of urination.  Patient denies burning with urination, hematuria, urine malodor, sensation of incomplete emptying, suprapubic pain, flank pain, fever, vaginal discharge, vaginal irritation, malaise, fatigue.  Patient has normal vital signs on arrival today.  Past Medical History:  Diagnosis Date   Abdominal pain, left lower quadrant 12/17/2015   Chest pain, unspecified 10/05/2003   CHF (congestive heart failure) (HCC)    Depressive disorder, not elsewhere classified 07/31/2005   Diabetes mellitus without complication (HCC) 09/08/2006   Endometriosis 09/14/2012   Hypertension 11/23/2003   Unspecified constipation 06/24/2004   Patient Active Problem List   Diagnosis Date Noted   Esophageal dysphagia 04/09/2023   Carotid artery disease (HCC) 10/27/2022   Protein calorie malnutrition (HCC) 04/07/2022   Chronic systolic heart failure (HCC) 02/20/2022   Pressure injury of skin 10/10/2021   Chronic pelvic pain in female 05/06/2020   Anxiety 07/01/2016   Wrist pain 06/24/2016   Back pain 06/03/2016   Abdominal pain, left lower quadrant 12/17/2015   History of colonic polyps 08/07/2015   Diverticulitis 05/28/2015   Seborrheic keratoses 01/30/2015   Hyperlipidemia 01/30/2015   Generalized anxiety disorder 07/04/2014   Diabetes mellitus without complication (HCC) 09/08/2006   Depression, major, recurrent, moderate (HCC) 07/31/2005   Hypertension 11/23/2003   Endometriosis 03/28/1996   Past Surgical History:  Procedure Laterality Date   APPENDECTOMY  1996   Dr. Wiliam Ke   BREAST SURGERY Bilateral 1996   Dr. Yolanda Bonine   ESOPHAGEAL MANOMETRY N/A 03/23/2023    Procedure: ESOPHAGEAL MANOMETRY (EM);  Surgeon: Benancio Deeds, MD;  Location: [facility];  Service: Gastroenterology;  Laterality: N/A;   INGUINAL HERNIA REPAIR     OVARIAN CYST REMOVAL     RIGHT/LEFT HEART CATH AND CORONARY ANGIOGRAPHY N/A 10/09/2021   Procedure: RIGHT/LEFT HEART CATH AND CORONARY ANGIOGRAPHY;  Surgeon: Orbie Pyo, MD;  Location: [facility];  Service: Cardiovascular;  Laterality: N/A;   TOOTH EXTRACTION N/A 07/28/2022   Procedure: EXTRACTION ALL REMAING TEETH THREE, FOUR, FIVE, SIX, SEVEN, EIGHT, NINE, TEN, ELEVEN, TWELVE, TWENTY ONE, TWENTY TWO, TWENTY THREE, TWENTY FOUR, TWENTY FIVE, TWENTY SIX, TWENTY SEVEN, TWENTY EIGHT, TWENTY NINE, THIRTY, AVEOLOPLASTY;  Surgeon: Ocie Doyne, DMD;  Location: [facility];  Service: Oral Surgery;  Laterality: N/A;   OB History     Gravida  1   Para      Term      Preterm      AB      Living  1      SAB      IAB      Ectopic      Multiple      Live Births  1          Home Medications    Prior to Admission medications   Medication Sig Start Date End Date Taking? Authorizing Provider  acetaminophen (TYLENOL) 500 MG tablet Take 1,000 mg by mouth every 6 (six) hours as needed for mild pain, fever or headache.    [provider]  alum & mag hydroxide-simeth (MYLANTA MAXIMUM STRENGTH) 400-400-40 MG/5ML suspension Take 5 mLs by mouth every 6 (six) hours  as needed for indigestion.    [provider]  amoxicillin-clavulanate (AUGMENTIN) 875-125 MG tablet Take 1 tablet by mouth 2 (two) times daily. 04/06/23   Edwin Cap, DPM  aspirin 81 MG chewable tablet Chew 1 tablet (81 mg total) by mouth daily. 12/15/21   Bensimhon, Bevelyn Buckles, MD  atorvastatin (LIPITOR) 80 MG tablet Take 1 tablet (80 mg total) by mouth daily. Please make and keep PCP appt. 06/11/23   Hoy Register, MD  clopidogrel (PLAVIX) 75 MG tablet Take 1 tablet (75 mg total) by mouth daily. 02/23/23   Hoy Register, MD   conjugated estrogens (PREMARIN) vaginal cream Insert 0.5g vaginally nightly for two weeks, then twice a week after 12/31/22   Selmer Dominion, NP  Continuous Blood Gluc Receiver (DEXCOM G7 RECEIVER) DEVI Use to check blood sugar 3 times daily. 06/02/22   Hoy Register, MD  Continuous Glucose Sensor (DEXCOM G7 SENSOR) MISC Use to check blood sugar 3 times daily. 12/11/22   Hoy Register, MD  dapagliflozin propanediol (FARXIGA) 10 MG TABS tablet Take 1 tablet (10 mg total) by mouth daily. 04/15/23   Hoy Register, MD  diazepam (VALIUM) 5 MG tablet Insert 1 tablet vaginally nightly as needed for muscle spasm/ pelvic pain. 12/30/22   Selmer Dominion, NP  Insulin Lispro Prot & Lispro (HUMALOG MIX 75/25 KWIKPEN) (75-25) 100 UNIT/ML Kwikpen Inject 32 Units into the skin in the morning AND 16 Units every evening. 02/23/23   Hoy Register, MD  Insulin Pen Needle (PENTIPS) 32G X 4 MM MISC Use as directed with insulin pen 11/17/22   Hoy Register, MD  loperamide (IMODIUM A-D) 2 MG tablet Take 1 tablet (2 mg total) by mouth as needed for diarrhea or loose stools. 02/09/23   Armbruster, Willaim Rayas, MD  metoprolol succinate (TOPROL-XL) 25 MG 24 hr tablet Take 1 tablet (25 mg total) by mouth daily. Take with or immediately following a meal. 03/10/22   Hoy Register, MD  Multiple Vitamin (MULTIVITAMIN WITH MINERALS) TABS tablet Take 1 tablet by mouth daily.    [provider]  sacubitril-valsartan (ENTRESTO) 49-51 MG Take 1 tablet by mouth 2 (two) times daily. 05/06/23   Hoy Register, MD  solifenacin (VESICARE) 10 MG tablet Take 1 tablet (10 mg total) by mouth daily. 12/30/22   Selmer Dominion, NP  spironolactone (ALDACTONE) 25 MG tablet Take 1 tablet (25 mg total) by mouth daily. 11/17/22   Hoy Register, MD  venlafaxine XR (EFFEXOR XR) 75 MG 24 hr capsule Take 1 capsule (75 mg total) by mouth daily with breakfast. For headaches 02/23/23   Hoy Register, MD  vitamin B-12 (CYANOCOBALAMIN) 1000 MCG  tablet Take 3,000 mcg by mouth daily.    [provider]    Family History Family History  Problem Relation Age of Onset   Diabetes Mother    Hypertension Mother    Arthritis Mother        RA   Pancreatic cancer Father    Stomach cancer Neg Hx    Colon cancer Neg Hx    Esophageal cancer Neg Hx    Rectal cancer Neg Hx    Social History Social History   Tobacco Use   Smoking status: Never   Smokeless tobacco: Never  Vaping Use   Vaping status: Never Used  Substance Use Topics   Alcohol use: No   Drug use: No   Allergies   Aspirin, Darvocet [propoxyphene n-acetaminophen], Erythromycin, Gabapentin, Ibuprofen, and Metformin and related  Review of  Systems Review of Systems Pertinent findings revealed after performing a 14 point review of systems has been noted in the history of present illness.  Physical Exam Vital Signs BP 91/61 (BP Location: Left Arm)   Pulse 96   Temp 98.6 F (37 C) (Oral)   Resp 18   LMP 11/16/2014 (Approximate)   SpO2 97%   No data found.  Physical Exam Vitals and nursing note reviewed.  Constitutional:      General: She is not in acute distress.    Appearance: Normal appearance. She is not ill-appearing.  HENT:     Head: Normocephalic and atraumatic.  Eyes:     General: Lids are normal.        Right eye: No discharge.        Left eye: No discharge.     Extraocular Movements: Extraocular movements intact.     Conjunctiva/sclera: Conjunctivae normal.     Right eye: Right conjunctiva is not injected.     Left eye: Left conjunctiva is not injected.  Neck:     Trachea: Trachea and phonation normal.  Cardiovascular:     Rate and Rhythm: Normal rate and regular rhythm.     Pulses: Normal pulses.     Heart sounds: Normal heart sounds. No murmur heard.    No friction rub. No gallop.  Pulmonary:     Effort: Pulmonary effort is normal. No accessory muscle usage, prolonged expiration or respiratory distress.     Breath sounds:  Normal breath sounds. No stridor, decreased air movement or transmitted upper airway sounds. No decreased breath sounds, wheezing, rhonchi or rales.  Chest:     Chest wall: No tenderness.  Abdominal:     General: Abdomen is flat. Bowel sounds are normal. There is no distension.     Palpations: Abdomen is soft.     Tenderness: There is no abdominal tenderness. There is no right CVA tenderness or left CVA tenderness.     Hernia: No hernia is present.  Musculoskeletal:        General: Normal range of motion.     Cervical back: Normal range of motion and neck supple. Normal range of motion.  Lymphadenopathy:     Cervical: No cervical adenopathy.  Skin:    General: Skin is warm and dry.     Findings: No erythema or rash.  Neurological:     General: No focal deficit present.     Mental Status: She is alert and oriented to person, place, and time.  Psychiatric:        Mood and Affect: Mood normal.        Behavior: Behavior normal.     Visual Acuity Right Eye Distance:   Left Eye Distance:   Bilateral Distance:    Right Eye Near:   Left Eye Near:    Bilateral Near:     [facility] Couse / Diagnostics / Procedures:     Radiology VAS [place] PAD ABI  Result Date: 06/21/2023  LOWER EXTREMITY DOPPLER STUDY Patient Name:  XOLANI GRUNDSTROM  Date of Exam:   06/21/2023 Medical Rec #: [id number]       Accession #:    [id number] Date of Birth: September 26, 1960       Patient Gender: F Patient Age:   72 years Exam Location:  [facility] Procedure:      VAS [place] ABI WITH/WO TBI Referring Phys: ADAM MCDONALD --------------------------------------------------------------------------------  Indications: Claudication. High Risk Factors: Hyperlipidemia, Diabetes, prior MI.  Comparison Study: No  prior study Performing Technologist: Gertie Fey MHA, RVT, RDCS, RDMS  Examination Guidelines: A complete evaluation includes at minimum, Doppler waveform signals and systolic blood pressure reading at the  level of bilateral brachial, anterior tibial, and posterior tibial arteries, when vessel segments are accessible. Bilateral testing is considered an integral part of a complete examination. Photoelectric Plethysmograph (PPG) waveforms and toe systolic pressure readings are included as required and additional duplex testing as needed. Limited examinations for reoccurring indications may be performed as noted.  ABI Findings: +---------+------------------+-----+-------------------+--------+ Right    Rt Pressure (mmHg)IndexWaveform           Comment  +---------+------------------+-----+-------------------+--------+ Brachial 109                                                +---------+------------------+-----+-------------------+--------+ ATA      54                0.50 dampened monophasic         +---------+------------------+-----+-------------------+--------+ PTA      62                0.57 monophasic                  +---------+------------------+-----+-------------------+--------+ Great Toe60                0.55                             +---------+------------------+-----+-------------------+--------+ +---------+------------------+-----+----------+-------+ Left     Lt Pressure (mmHg)IndexWaveform  Comment +---------+------------------+-----+----------+-------+ Brachial 104                                      +---------+------------------+-----+----------+-------+ ATA      99                0.91 monophasic        +---------+------------------+-----+----------+-------+ PTA      108               0.99 monophasic        +---------+------------------+-----+----------+-------+ Great Toe                       Absent            +---------+------------------+-----+----------+-------+ +-------+-----------+-----------+------------+------------+ ABI/TBIToday's ABIToday's TBIPrevious ABIPrevious TBI +-------+-----------+-----------+------------+------------+  Right  0.57       0.55                                +-------+-----------+-----------+------------+------------+ Left   0.99       0.00                                +-------+-----------+-----------+------------+------------+   Summary: Right: Resting right ankle-brachial index indicates moderate right lower extremity arterial disease. Left: Although ankle brachial indices are within normal limits (0.95-1.29), arterial Doppler waveforms at the ankle suggest some component of arterial occlusive disease; likely falsely elevated secondary to medial calcification. *See table(s) above for measurements and observations.  Electronically signed by Coral Else MD on 06/21/2023 at 2:08:12 PM.    Final     Procedures  Procedures (including critical care time) EKG  Pending results:  Labs Reviewed  POCT URINALYSIS DIP (MANUAL ENTRY) - Abnormal; Notable for the following components:      Result Value   Clarity, UA hazy (*)    Glucose, UA >=1,000 (*)    Blood, UA moderate (*)    Protein Ur, POC =30 (*)    Leukocytes, UA Large (3+) (*)    All other components within normal limits    Medications Ordered in UC: Medications - No data to display  UC Diagnoses / Final Clinical Impressions(s)   I have reviewed the triage vital signs and the nursing notes.  Pertinent labs & imaging results that were available during my care of the patient were reviewed by me and considered in my medical decision making (see chart for details).    Final diagnoses:  Acute cystitis with hematuria   Urine dip today was positive.   Patient was advised to begin antibiotics now due to findings on urine dip. Patient was advised to begin antibiotics today due to having active symptoms of urinary tract infection.                    Patient was advised to take all doses exactly as prescribed.  Patient also advised of risks of worsening infection with incomplete antibiotic therapy. Return precautions advised.  Please  see discharge instructions below for details of plan of care as provided to patient. ED Prescriptions     Medication Sig Dispense Auth. Provider   nitrofurantoin, macrocrystal-monohydrate, (MACROBID) 100 MG capsule Take 1 capsule (100 mg total) by mouth 2 (two) times daily for 5 days. 10 capsule Theadora Rama Scales, PA-C      PDMP not reviewed this encounter.  Disposition Upon Discharge:  Condition: stable for discharge home  Patient presented with concern for an acute illness with associated systemic symptoms and significant discomfort requiring urgent management. In my opinion, this is a condition that a prudent lay person (someone who possesses an average knowledge of health and medicine) may potentially expect to result in complications if not addressed urgently such as respiratory distress, impairment of bodily function or dysfunction of bodily organs.   As such, the patient has been evaluated and assessed, work-up was performed and treatment was provided in alignment with urgent care protocols and evidence based medicine.  Patient/parent/caregiver has been advised that the patient may require follow up for further testing and/or treatment if the symptoms continue in spite of treatment, as clinically indicated and appropriate.  Routine symptom specific, illness specific and/or disease specific instructions were discussed with the patient and/or caregiver at length.  Prevention strategies for avoiding STD exposure were also discussed.  The patient will follow up with their current PCP if and as advised. If the patient does not currently have a PCP we will assist them in obtaining one.   The patient may need specialty follow up if the symptoms continue, in spite of conservative treatment and management, for further workup, evaluation, consultation and treatment as clinically indicated and appropriate.  Patient/parent/caregiver verbalized understanding and agreement of plan as discussed.   All questions were addressed during visit.  Please see discharge instructions below for further details of plan.  Discharge Instructions:   Discharge Instructions      Common causes of urinary tract infections include but are not limited to holding your urine longer than you should, squatting instead of sitting down when urinating, sitting around in wet clothing such as a wet  swimsuit or gym clothes too long, not emptying your bladder after having sexual intercourse, wiping from back to front instead of front to back after having a bowel movement.     Less common causes of urinary tract infections include but are not limited to anatomical shifts in the location of your bladder or uterus causing obstruction of passage of urine from your bladder to your urethra where your urine comes out or prolapse of your rectum into your vaginal wall.  These less common causes can be evaluated by gynecologist, a urologist or subspecialist called a uro-gynecologist   The urinalysis that we performed in the clinic today was abnormal.     You were advised to begin antibiotics today because your urinalysis is abnormal and you are having active symptoms of an acute lower urinary tract infection also known as cystitis.      If you have not had complete resolution of your symptoms after completing treatment as prescribed, please return to urgent care for repeat evaluation or follow-up with your primary care provider.  Repeat urinalysis and urine culture may be indicated for more directed therapy.   Thank you for visiting [facility] today.  We appreciate the opportunity to participate in your care.       This office note has been dictated using Teaching laboratory technician.  Unfortunately, this method of dictation can sometimes lead to typographical or grammatical errors.  I apologize for your inconvenience in advance if this occurs.  Please do not hesitate to reach out to me if clarification is  needed.       Theadora Rama Scales, PA-C 06/21/23 1945

## 2023-06-21 NOTE — Telephone Encounter (Signed)
Noted. Patient has appointment this afternoon.

## 2023-06-21 NOTE — Discharge Instructions (Signed)
Common causes of urinary tract infections include but are not limited to holding your urine longer than you should, squatting instead of sitting down when urinating, sitting around in wet clothing such as a wet swimsuit or gym clothes too long, not emptying your bladder after having sexual intercourse, wiping from back to front instead of front to back after having a bowel movement.     Less common causes of urinary tract infections include but are not limited to anatomical shifts in the location of your bladder or uterus causing obstruction of passage of urine from your bladder to your urethra where your urine comes out or prolapse of your rectum into your vaginal wall.  These less common causes can be evaluated by gynecologist, a urologist or subspecialist called a uro-gynecologist   The urinalysis that we performed in the clinic today was abnormal.     You were advised to begin antibiotics today because your urinalysis is abnormal and you are having active symptoms of an acute lower urinary tract infection also known as cystitis.      If you have not had complete resolution of your symptoms after completing treatment as prescribed, please return to urgent care for repeat evaluation or follow-up with your primary care provider.  Repeat urinalysis and urine culture may be indicated for more directed therapy.   Thank you for visiting [facility] today.  We appreciate the opportunity to participate in your care.

## 2023-06-22 ENCOUNTER — Other Ambulatory Visit: Payer: Self-pay

## 2023-06-22 ENCOUNTER — Other Ambulatory Visit (HOSPITAL_COMMUNITY): Payer: Self-pay | Admitting: Podiatry

## 2023-06-22 ENCOUNTER — Telehealth: Payer: Self-pay

## 2023-06-22 DIAGNOSIS — I70222 Atherosclerosis of native arteries of extremities with rest pain, left leg: Secondary | ICD-10-CM

## 2023-06-22 NOTE — Telephone Encounter (Signed)
-----   Message from Edwin Cap sent at 06/22/2023  9:30 AM EST ----- Can you let her know she needs to see vascular surgery ASAP? I've placed an urgent referral. She can call to schedule:   [phone]   [facility] [address] [place], [place] [address]

## 2023-06-22 NOTE — Telephone Encounter (Signed)
Called patient to notify. Left a message advising patient to call [facility] on Valarie Merino [phone] for an appointment. Also advised to call back with questions about results. Thanks

## 2023-06-23 ENCOUNTER — Telehealth: Payer: Self-pay

## 2023-06-23 NOTE — Telephone Encounter (Signed)
Patient called back. We discuss her results briefly -She has already been contacted by [facility] - She has an appointment scheduled for 07/06/23 Thanks

## 2023-06-29 DIAGNOSIS — M542 Cervicalgia: Secondary | ICD-10-CM | POA: Diagnosis not present

## 2023-06-29 DIAGNOSIS — M5416 Radiculopathy, lumbar region: Secondary | ICD-10-CM | POA: Diagnosis not present

## 2023-07-05 ENCOUNTER — Ambulatory Visit: Payer: Medicaid Other | Admitting: Obstetrics and Gynecology

## 2023-07-05 ENCOUNTER — Other Ambulatory Visit: Payer: Self-pay

## 2023-07-05 ENCOUNTER — Encounter: Payer: Self-pay | Admitting: Obstetrics and Gynecology

## 2023-07-05 VITALS — BP 114/68 | HR 74

## 2023-07-05 DIAGNOSIS — N3941 Urge incontinence: Secondary | ICD-10-CM

## 2023-07-05 DIAGNOSIS — R300 Dysuria: Secondary | ICD-10-CM

## 2023-07-05 DIAGNOSIS — R350 Frequency of micturition: Secondary | ICD-10-CM | POA: Diagnosis not present

## 2023-07-05 MED ORDER — FOSFOMYCIN TROMETHAMINE 3 G PO PACK
3.0000 g | PACK | Freq: Once | ORAL | 0 refills | Status: AC
  Filled 2023-07-05: qty 1, 1d supply, fill #0

## 2023-07-05 MED ORDER — SOLIFENACIN SUCCINATE 10 MG PO TABS
10.0000 mg | ORAL_TABLET | Freq: Every day | ORAL | 3 refills | Status: DC
  Filled 2023-07-05 – 2023-09-20 (×3): qty 90, 90d supply, fill #0

## 2023-07-05 MED ORDER — FLUCONAZOLE 150 MG PO TABS
150.0000 mg | ORAL_TABLET | Freq: Once | ORAL | 0 refills | Status: AC
Start: 2023-07-05 — End: 2023-07-08
  Filled 2023-07-05: qty 1, 1d supply, fill #0

## 2023-07-05 NOTE — Progress Notes (Unsigned)
Patient name: Brooke [name] MRN: [id number] DOB: [birth year] Sex: female  REASON FOR VISIT: Left hallux wound  HPI: Brooke [name] is a 62 y.o. female with history of hypertension, diabetes, CHF that presents for evaluation of left hallux wound.  Patient has been followed by Dr. Lilian Kapur with podiatry.  She did have ABIs on 06/21/2023 that were 0.99 on the left monophasic with a toe pressure that was 0.  On the right her ABI was 0.57.  Patient been seen for carotid artery disease in the past.  Past Medical History:  Diagnosis Date   Abdominal pain, left lower quadrant 12/17/2015   Chest pain, unspecified 10/05/2003   CHF (congestive heart failure) (HCC)    Depressive disorder, not elsewhere classified 07/31/2005   Diabetes mellitus without complication (HCC) 09/08/2006   Endometriosis 09/14/2012   Hypertension 11/23/2003   Unspecified constipation 06/24/2004    Past Surgical History:  Procedure Laterality Date   APPENDECTOMY  1996   Dr. Wiliam Ke   BREAST SURGERY Bilateral 1996   Dr. Yolanda Bonine   ESOPHAGEAL MANOMETRY N/A 03/23/2023   Procedure: ESOPHAGEAL MANOMETRY (EM);  Surgeon: Benancio Deeds, MD;  Location: [facility];  Service: Gastroenterology;  Laterality: N/A;   INGUINAL HERNIA REPAIR     OVARIAN CYST REMOVAL     RIGHT/LEFT HEART CATH AND CORONARY ANGIOGRAPHY N/A 10/09/2021   Procedure: RIGHT/LEFT HEART CATH AND CORONARY ANGIOGRAPHY;  Surgeon: Orbie Pyo, MD;  Location: [facility];  Service: Cardiovascular;  Laterality: N/A;   TOOTH EXTRACTION N/A 07/28/2022   Procedure: EXTRACTION ALL REMAING TEETH THREE, FOUR, FIVE, SIX, SEVEN, EIGHT, NINE, TEN, ELEVEN, TWELVE, TWENTY ONE, TWENTY TWO, TWENTY THREE, TWENTY FOUR, TWENTY FIVE, TWENTY SIX, TWENTY SEVEN, TWENTY EIGHT, TWENTY NINE, THIRTY, AVEOLOPLASTY;  Surgeon: Ocie Doyne, DMD;  Location: [facility];  Service: Oral Surgery;  Laterality: N/A;    Family History  Problem Relation Age of Onset   Diabetes Mother     Hypertension Mother    Arthritis Mother        RA   Pancreatic cancer Father    Stomach cancer Neg Hx    Colon cancer Neg Hx    Esophageal cancer Neg Hx    Rectal cancer Neg Hx     SOCIAL HISTORY: Social History   Tobacco Use   Smoking status: Never   Smokeless tobacco: Never  Substance Use Topics   Alcohol use: No    Allergies  Allergen Reactions   Aspirin Other (See Comments)    Intolerance (aspirin sensitive stomach)   Darvocet [Propoxyphene N-Acetaminophen] Rash    Skin reaction on Vaginal area   Erythromycin Nausea And Vomiting   Gabapentin Other (See Comments)    Suicidal ideations    Ibuprofen Other (See Comments)    Stomach issues   Metformin And Related Nausea Only    Current Outpatient Medications  Medication Sig Dispense Refill   aspirin 81 MG chewable tablet Chew 1 tablet (81 mg total) by mouth daily. 30 tablet 1   atorvastatin (LIPITOR) 80 MG tablet Take 1 tablet (80 mg total) by mouth daily. Please make and keep PCP appt. 90 tablet 0   clopidogrel (PLAVIX) 75 MG tablet Take 1 tablet (75 mg total) by mouth daily. 90 tablet 1   conjugated estrogens (PREMARIN) vaginal cream Insert 0.5g vaginally nightly for two weeks, then twice a week after 42.5 g 11   Continuous Blood Gluc Receiver (DEXCOM G7 RECEIVER) DEVI Use to check blood sugar 3 times daily. 1  each 0   Continuous Glucose Sensor (DEXCOM G7 SENSOR) MISC Use to check blood sugar 3 times daily. 3 each 6   dapagliflozin propanediol (FARXIGA) 10 MG TABS tablet Take 1 tablet (10 mg total) by mouth daily. 30 tablet 3   diazepam (VALIUM) 5 MG tablet Insert 1 tablet vaginally nightly as needed for muscle spasm/ pelvic pain. 15 tablet 1   fluconazole (DIFLUCAN) 150 MG tablet Take 1 tablet (150 mg total) by mouth once for 1 dose. 1 tablet 0   fosfomycin (MONUROL) 3 g PACK Take 3 g by mouth once for 1 dose. Dissolve packet in 3-4 oz of water and drink immediately. 1 each 0   Insulin Lispro Prot & Lispro (HUMALOG  MIX 75/25 KWIKPEN) (75-25) 100 UNIT/ML Kwikpen Inject 32 Units into the skin in the morning AND 16 Units every evening. 30 mL 2   Insulin Pen Needle (PENTIPS) 32G X 4 MM MISC Use as directed with insulin pen 100 each 0   loperamide (IMODIUM A-D) 2 MG tablet Take 1 tablet (2 mg total) by mouth as needed for diarrhea or loose stools. 30 tablet 0   metoprolol succinate (TOPROL-XL) 25 MG 24 hr tablet Take 1 tablet (25 mg total) by mouth daily. Take with or immediately following a meal. 90 tablet 3   Multiple Vitamin (MULTIVITAMIN WITH MINERALS) TABS tablet Take 1 tablet by mouth daily.     sacubitril-valsartan (ENTRESTO) 49-51 MG Take 1 tablet by mouth 2 (two) times daily. 180 tablet 3   solifenacin (VESICARE) 10 MG tablet Take 1 tablet (10 mg total) by mouth daily. 90 tablet 3   spironolactone (ALDACTONE) 25 MG tablet Take 1 tablet (25 mg total) by mouth daily. 90 tablet 1   venlafaxine XR (EFFEXOR XR) 75 MG 24 hr capsule Take 1 capsule (75 mg total) by mouth daily with breakfast. For headaches 30 capsule 3   vitamin B-12 (CYANOCOBALAMIN) 1000 MCG tablet Take 3,000 mcg by mouth daily.     No current facility-administered medications for this visit.    REVIEW OF SYSTEMS:  [X]  denotes positive finding, [ ]  denotes negative finding Cardiac  Comments:  Chest pain or chest pressure: ***   Shortness of breath upon exertion:    Short of breath when lying flat:    Irregular heart rhythm:        Vascular    Pain in calf, thigh, or hip brought on by ambulation:    Pain in feet at night that wakes you up from your sleep:     Blood clot in your veins:    Leg swelling:         Pulmonary    Oxygen at home:    Productive cough:     Wheezing:         Neurologic    Sudden weakness in arms or legs:     Sudden numbness in arms or legs:     Sudden onset of difficulty speaking or slurred speech:    Temporary loss of vision in one eye:     Problems with dizziness:         Gastrointestinal    Blood in  stool:     Vomited blood:         Genitourinary    Burning when urinating:     Blood in urine:        Psychiatric    Major depression:         Hematologic    Bleeding problems:  Problems with blood clotting too easily:        Skin    Rashes or ulcers:        Constitutional    Fever or chills:      PHYSICAL EXAM: There were no vitals filed for this visit.  GENERAL: The patient is a well-nourished female, in no acute distress. The vital signs are documented above. CARDIAC: There is a regular rate and rhythm.  VASCULAR: *** PULMONARY: There is good air exchange bilaterally without wheezing or rales. ABDOMEN: Soft and non-tender with normal pitched bowel sounds.  MUSCULOSKELETAL: There are no major deformities or cyanosis. NEUROLOGIC: No focal weakness or paresthesias are detected. SKIN: There are no ulcers or rashes noted. PSYCHIATRIC: The patient has a normal affect.  DATA:   ***  Assessment/Plan:  62 y.o. female with history of hypertension, diabetes, CHF that presents for evaluation of left hallux wound.  Patient has been followed by Dr. Lilian Kapur with podiatry.  She did have ABIs on 06/21/2023 that were 0.99 on the left monophasic with a toe pressure that was 0.    Cephus Shelling, MD [facility] Office: [phone]

## 2023-07-05 NOTE — Progress Notes (Signed)
[facility] Return Visit  SUBJECTIVE  History of Present Illness: Aneya Carnero is a 62 y.o. female seen in follow-up for UTI and OAB. Plan at last visit was continue Vesicare 10mg  for OAB. She uses vaginal valium approximately once per week for her pelvic floor pain which she reports is difficult to manage at times.   She also reports she has had a UTI and went to urgent care.     Past Medical History: Patient  has a past medical history of Abdominal pain, left lower quadrant (12/17/2015), Chest pain, unspecified (10/05/2003), CHF (congestive heart failure) (HCC), Depressive disorder, not elsewhere classified (07/31/2005), Diabetes mellitus without complication (HCC) (09/08/2006), Endometriosis (09/14/2012), Hypertension (11/23/2003), and Unspecified constipation (06/24/2004).   Past Surgical History: She  has a past surgical history that includes Inguinal hernia repair; Ovarian cyst removal; Breast surgery (Bilateral, 1996); Appendectomy (1996); RIGHT/LEFT HEART CATH AND CORONARY ANGIOGRAPHY (N/A, 10/09/2021); Tooth Extraction (N/A, 07/28/2022); and Esophageal manometry (N/A, 03/23/2023).   Medications: She has a current medication list which includes the following prescription(s): aspirin, atorvastatin, clopidogrel, conjugated estrogens, dexcom g7 receiver, dexcom g7 sensor, dapagliflozin propanediol, diazepam, fluconazole, fosfomycin, insulin lispro prot & lispro, pentips, loperamide, metoprolol succinate, multivitamin with minerals, entresto, spironolactone, venlafaxine xr, cyanocobalamin, and solifenacin.   Allergies: Patient is allergic to aspirin, darvocet [propoxyphene n-acetaminophen], erythromycin, gabapentin, ibuprofen, and metformin and related.   Social History: Patient  reports that she has never smoked. She has never used smokeless tobacco. She reports that she does not drink alcohol and does not use drugs.      OBJECTIVE     Physical Exam: Vitals:    07/05/23 1330  BP: 114/68  Pulse: 74   Gen: No apparent distress, A&O x 3.  Detailed Urogynecologic Evaluation:  Vaginal exam shows atrophy with no sign of bleeding or obvious discharge resembling yeast or other bacteria.    ASSESSMENT AND PLAN    Ms. Zeeman is a 62 y.o. with:  1. Dysuria   2. Urge incontinence   3. Urinary frequency     Dysuria -     [facility] Molecular Test  Urge incontinence -     Solifenacin Succinate; Take 1 tablet (10 mg total) by mouth daily.  Dispense: 90 tablet; Refill: 3  Urinary frequency -     [facility] Molecular Test  Other orders -     Fosfomycin Tromethamine; Take 3 g by mouth once for 1 dose. Dissolve packet in 3-4 oz of water and drink immediately.  Dispense: 1 each; Refill: 0 -     Fluconazole; Take 1 tablet (150 mg total) by mouth once for 1 dose.  Dispense: 1 tablet; Refill: 0  Patient's urine highly suspicious for UTI. Will send for [facility] testing to rule out other organisms.  Patient to take one dose of Fosfomycin to treat UTI and will further treat once [facility] PCR testing comes back.  Patient can continue on Vesicare 10mg  daily for her OAB.   Patient to return in 6 months or sooner for medication follow up.    Selmer Dominion, NP

## 2023-07-05 NOTE — Patient Instructions (Signed)
Please use the estrogen twice a week, your vaginal tissues are very red and irritated.   We are sending your urine for testing.   Please take one dose of Fosfomycin for the UTI  Continue to use the vesicare for your overactive bladder.

## 2023-07-06 ENCOUNTER — Ambulatory Visit: Payer: Medicaid Other | Admitting: Vascular Surgery

## 2023-07-06 ENCOUNTER — Encounter: Payer: Self-pay | Admitting: Vascular Surgery

## 2023-07-06 VITALS — BP 127/77 | HR 72 | Temp 97.8°F | Ht 64.5 in | Wt 109.5 lb

## 2023-07-06 DIAGNOSIS — I739 Peripheral vascular disease, unspecified: Secondary | ICD-10-CM | POA: Diagnosis not present

## 2023-07-07 ENCOUNTER — Other Ambulatory Visit: Payer: Self-pay | Admitting: Family Medicine

## 2023-07-07 ENCOUNTER — Other Ambulatory Visit: Payer: Self-pay | Admitting: Obstetrics and Gynecology

## 2023-07-07 ENCOUNTER — Other Ambulatory Visit: Payer: Self-pay

## 2023-07-07 DIAGNOSIS — A498 Other bacterial infections of unspecified site: Secondary | ICD-10-CM

## 2023-07-07 DIAGNOSIS — I5022 Chronic systolic (congestive) heart failure: Secondary | ICD-10-CM

## 2023-07-07 MED ORDER — CIPROFLOXACIN HCL 500 MG PO TABS: 500.0000 mg | ORAL_TABLET | Freq: Two times a day (BID) | ORAL | 0 refills | Status: DC

## 2023-07-07 MED ORDER — CIPROFLOXACIN HCL 500 MG PO TABS
500.0000 mg | ORAL_TABLET | Freq: Two times a day (BID) | ORAL | 0 refills | Status: AC
  Filled 2023-07-07: qty 6, 3d supply, fill #0

## 2023-07-07 MED ORDER — METOPROLOL SUCCINATE ER 25 MG PO TB24
25.0000 mg | ORAL_TABLET | Freq: Every day | ORAL | 0 refills | Status: DC
  Filled 2023-07-07: qty 30, 30d supply, fill #0

## 2023-07-07 NOTE — Progress Notes (Signed)
Patient's urine testing showed positive for Klebsiella Pneumoniae. Will treat with suggested Ciproflaxcin due to noted resistances.

## 2023-07-09 ENCOUNTER — Other Ambulatory Visit: Payer: Self-pay

## 2023-07-12 ENCOUNTER — Other Ambulatory Visit: Payer: Self-pay

## 2023-07-12 ENCOUNTER — Telehealth: Payer: Self-pay

## 2023-07-12 DIAGNOSIS — S91102A Unspecified open wound of left great toe without damage to nail, initial encounter: Secondary | ICD-10-CM

## 2023-07-12 DIAGNOSIS — I739 Peripheral vascular disease, unspecified: Secondary | ICD-10-CM

## 2023-07-12 NOTE — Telephone Encounter (Signed)
Attempted to reach pt to schedule her AGM. Left VM for her to return our call.

## 2023-07-14 ENCOUNTER — Other Ambulatory Visit: Payer: Self-pay | Admitting: Obstetrics and Gynecology

## 2023-07-14 ENCOUNTER — Other Ambulatory Visit: Payer: Self-pay

## 2023-07-14 DIAGNOSIS — A498 Other bacterial infections of unspecified site: Secondary | ICD-10-CM

## 2023-07-14 MED ORDER — DOXYCYCLINE HYCLATE 100 MG PO CAPS
100.0000 mg | ORAL_CAPSULE | Freq: Two times a day (BID) | ORAL | 0 refills | Status: AC
  Filled 2023-07-14: qty 20, 10d supply, fill #0

## 2023-07-14 NOTE — Progress Notes (Unsigned)
Patient called and reported that her symptoms are persisting with the UTI. Second suggested antibiotic was Doxycycline due to multiple resistances.

## 2023-07-16 ENCOUNTER — Other Ambulatory Visit: Payer: Self-pay

## 2023-07-29 ENCOUNTER — Other Ambulatory Visit: Payer: Self-pay

## 2023-07-29 ENCOUNTER — Encounter (HOSPITAL_COMMUNITY)
Admission: RE | Disposition: A | Discharge status: Home / Self Care | Payer: Self-pay | Source: Home / Self Care | Attending: Vascular Surgery

## 2023-07-29 ENCOUNTER — Ambulatory Visit (HOSPITAL_COMMUNITY)
Admission: RE | Admit: 2023-07-29 | Discharge: 2023-07-29 | Disposition: A | Discharge status: Home / Self Care | Payer: Medicaid Other | Attending: Vascular Surgery | Admitting: Vascular Surgery

## 2023-07-29 DIAGNOSIS — I739 Peripheral vascular disease, unspecified: Secondary | ICD-10-CM

## 2023-07-29 DIAGNOSIS — I509 Heart failure, unspecified: Secondary | ICD-10-CM | POA: Insufficient documentation

## 2023-07-29 DIAGNOSIS — S91102A Unspecified open wound of left great toe without damage to nail, initial encounter: Secondary | ICD-10-CM

## 2023-07-29 DIAGNOSIS — L97529 Non-pressure chronic ulcer of other part of left foot with unspecified severity: Secondary | ICD-10-CM | POA: Insufficient documentation

## 2023-07-29 DIAGNOSIS — I110 Hypertensive heart disease with heart failure: Secondary | ICD-10-CM | POA: Insufficient documentation

## 2023-07-29 DIAGNOSIS — E11621 Type 2 diabetes mellitus with foot ulcer: Secondary | ICD-10-CM | POA: Insufficient documentation

## 2023-07-29 DIAGNOSIS — Z833 Family history of diabetes mellitus: Secondary | ICD-10-CM | POA: Insufficient documentation

## 2023-07-29 DIAGNOSIS — I70245 Atherosclerosis of native arteries of left leg with ulceration of other part of foot: Secondary | ICD-10-CM

## 2023-07-29 DIAGNOSIS — I70222 Atherosclerosis of native arteries of extremities with rest pain, left leg: Secondary | ICD-10-CM | POA: Insufficient documentation

## 2023-07-29 DIAGNOSIS — Z8249 Family history of ischemic heart disease and other diseases of the circulatory system: Secondary | ICD-10-CM | POA: Insufficient documentation

## 2023-07-29 HISTORY — PX: PERIPHERAL VASCULAR BALLOON ANGIOPLASTY: CATH118281

## 2023-07-29 HISTORY — PX: ABDOMINAL AORTOGRAM W/LOWER EXTREMITY: CATH118223

## 2023-07-29 LAB — POCT I-STAT, CHEM 8
BUN: 14 mg/dL (ref 6–20)
Calcium, Ion: 1.17 mmol/L (ref 1.15–1.40)
Chloride: 99 mmol/L (ref 98–111)
Creatinine, Ser: 0.6 mg/dL (ref 0.44–1.00)
Glucose, Bld: 257 mg/dL — ABNORMAL HIGH (ref 70–99)
HCT: 34 % — ABNORMAL LOW (ref 36.0–46.0)
Hemoglobin: 11.6 g/dL — ABNORMAL LOW (ref 12.0–15.0)
Potassium: 3.6 mmol/L (ref 3.5–5.1)
Sodium: 138 mmol/L (ref 135–145)
TCO2: 25 mmol/L (ref 22–32)

## 2023-07-29 LAB — GLUCOSE, CAPILLARY: Glucose-Capillary: 197 mg/dL — ABNORMAL HIGH (ref 70–99)

## 2023-07-29 SURGERY — ABDOMINAL AORTOGRAM W/LOWER EXTREMITY
Anesthesia: LOCAL | Laterality: Left

## 2023-07-29 MED ORDER — SODIUM CHLORIDE 0.9% FLUSH: 3.0000 mL | INTRAVENOUS | Status: DC | PRN

## 2023-07-29 MED ORDER — CLOPIDOGREL BISULFATE 75 MG PO TABS
ORAL_TABLET | ORAL | Status: DC | PRN
  Administered 2023-07-29: 75 mg via ORAL

## 2023-07-29 MED ORDER — SODIUM CHLORIDE 0.9 % IV SOLN: 250.0000 mL | INTRAVENOUS | Status: DC | PRN

## 2023-07-29 MED ORDER — SODIUM CHLORIDE 0.9 % IV SOLN: INTRAVENOUS | Status: DC

## 2023-07-29 MED ORDER — FENTANYL CITRATE (PF) 100 MCG/2ML IJ SOLN
INTRAMUSCULAR | Status: AC
  Filled 2023-07-29: qty 2

## 2023-07-29 MED ORDER — LIDOCAINE HCL (PF) 1 % IJ SOLN
INTRAMUSCULAR | Status: DC | PRN
  Administered 2023-07-29: 10 mL

## 2023-07-29 MED ORDER — HYDRALAZINE HCL 20 MG/ML IJ SOLN: 5.0000 mg | INTRAMUSCULAR | Status: DC | PRN

## 2023-07-29 MED ORDER — ASPIRIN 81 MG PO CHEW
CHEWABLE_TABLET | ORAL | Status: AC
  Filled 2023-07-29: qty 1

## 2023-07-29 MED ORDER — HEPARIN SODIUM (PORCINE) 1000 UNIT/ML IJ SOLN
INTRAMUSCULAR | Status: DC | PRN
  Administered 2023-07-29: 5000 [IU] via INTRAVENOUS

## 2023-07-29 MED ORDER — MIDAZOLAM HCL 2 MG/2ML IJ SOLN
INTRAMUSCULAR | Status: DC | PRN
  Administered 2023-07-29: 1 mg via INTRAVENOUS

## 2023-07-29 MED ORDER — ONDANSETRON HCL 4 MG/2ML IJ SOLN
4.0000 mg | Freq: Four times a day (QID) | INTRAMUSCULAR | Status: DC | PRN
Start: 2023-07-29 — End: 2023-07-29

## 2023-07-29 MED ORDER — LABETALOL HCL 5 MG/ML IV SOLN: 10.0000 mg | INTRAVENOUS | Status: DC | PRN

## 2023-07-29 MED ORDER — ASPIRIN 81 MG PO CHEW
CHEWABLE_TABLET | ORAL | Status: DC | PRN
  Administered 2023-07-29: 81 mg via ORAL

## 2023-07-29 MED ORDER — LIDOCAINE HCL (PF) 1 % IJ SOLN
INTRAMUSCULAR | Status: AC
  Filled 2023-07-29: qty 30

## 2023-07-29 MED ORDER — MIDAZOLAM HCL 2 MG/2ML IJ SOLN
INTRAMUSCULAR | Status: AC
Start: 2023-07-29 — End: ?
  Filled 2023-07-29: qty 2

## 2023-07-29 MED ORDER — SODIUM CHLORIDE 0.9% FLUSH: 3.0000 mL | Freq: Two times a day (BID) | INTRAVENOUS | Status: DC

## 2023-07-29 MED ORDER — OXYCODONE HCL 5 MG PO TABS: 5.0000 mg | ORAL_TABLET | ORAL | Status: DC | PRN

## 2023-07-29 MED ORDER — CLOPIDOGREL BISULFATE 75 MG PO TABS
ORAL_TABLET | ORAL | Status: AC
  Filled 2023-07-29: qty 1

## 2023-07-29 MED ORDER — CLOPIDOGREL BISULFATE 75 MG PO TABS: 75.0000 mg | ORAL_TABLET | Freq: Every day | ORAL | Status: DC

## 2023-07-29 MED ORDER — IODIXANOL 320 MG/ML IV SOLN
INTRAVENOUS | Status: DC | PRN
  Administered 2023-07-29: 65 mL

## 2023-07-29 MED ORDER — HEPARIN SODIUM (PORCINE) 1000 UNIT/ML IJ SOLN
INTRAMUSCULAR | Status: AC
  Filled 2023-07-29: qty 10

## 2023-07-29 MED ORDER — HEPARIN (PORCINE) IN NACL 1000-0.9 UT/500ML-% IV SOLN
INTRAVENOUS | Status: DC | PRN
  Administered 2023-07-29 (×2): 500 mL

## 2023-07-29 MED ORDER — FENTANYL CITRATE (PF) 100 MCG/2ML IJ SOLN
INTRAMUSCULAR | Status: DC | PRN
  Administered 2023-07-29: 25 ug via INTRAVENOUS

## 2023-07-29 MED ORDER — ASPIRIN 81 MG PO TBEC: 81.0000 mg | DELAYED_RELEASE_TABLET | Freq: Every day | ORAL | Status: DC

## 2023-07-29 SURGICAL SUPPLY — 22 items
BALLN STERLING OTW 2X220X150 (BALLOONS) ×1
BALLN STERLING SL OTW 3X80X150 (BALLOONS) ×1
BALLOON STERLING OTW 2X220X150 (BALLOONS) IMPLANT
BALLOON STRLNG SL OTW 3X80X150 (BALLOONS) IMPLANT
CATH OMNI FLUSH 5F 65CM (CATHETERS) IMPLANT
CATH QUICKCROSS .018X135CM (MICROCATHETER) IMPLANT
CATH TEMPO AQUA 5F 100CM (CATHETERS) IMPLANT
COVER DOME SNAP 22 D (MISCELLANEOUS) IMPLANT
DEVICE CLOSURE MYNXGRIP 6/7F (Vascular Products) IMPLANT
DRAPE LULU FEM/RAD ANGIO (DRAPES) IMPLANT
GLIDEWIRE ADV .035X260CM (WIRE) IMPLANT
KIT ENCORE 26 ADVANTAGE (KITS) IMPLANT
KIT MICROPUNCTURE NIT STIFF (SHEATH) IMPLANT
KIT SINGLE USE MANIFOLD (KITS) IMPLANT
SET ATX-X65L (MISCELLANEOUS) IMPLANT
SHEATH CATAPULT 6F 45 MP (SHEATH) IMPLANT
SHEATH PINNACLE 5F 10CM (SHEATH) IMPLANT
SHEATH PINNACLE 6F 10CM (SHEATH) IMPLANT
SHEATH PROBE COVER 6X72 (BAG) IMPLANT
TRAY PV CATH (CUSTOM PROCEDURE TRAY) ×2 IMPLANT
WIRE BENTSON .035X145CM (WIRE) IMPLANT
WIRE G V18X300CM (WIRE) IMPLANT

## 2023-07-29 NOTE — H&P (Signed)
History and Physical Interval Note:  07/29/2023 9:13 AM  [name]  has presented today for surgery, with the diagnosis of left great toe wound.  The various methods of treatment have been discussed with the patient and family. After consideration of risks, benefits and other options for treatment, the patient has consented to  Procedure(s): ABDOMINAL AORTOGRAM W/LOWER EXTREMITY (N/A) as a surgical intervention.  The patient's history has been reviewed, patient examined, no change in status, stable for surgery.  I have reviewed the patient's chart and labs.  Questions were answered to the patient's satisfaction.     Brooke [name]     Patient name: Brooke [name]            MRN: [id number]        DOB: 1961-05-24          Sex: female   REASON FOR VISIT: Left hallux wound   HPI: Brooke [name] is a 62 y.o. female with history of diabetes and CHF that presents for evaluation of left hallux wound.  Patient has been followed by Dr. Lilian Kapur with podiatry.  She states this wound has been present since August when she had a pedicure.  It has been nonhealing.  She did have ABIs on 06/21/2023 that were 0.99 on the left monophasic with a toe pressure that was 0.  On the right her ABI was 0.57.  No previous arterial interventions.   Patient been seen for carotid artery disease in the past.       Past Medical History:  Diagnosis Date   Abdominal pain, left lower quadrant 12/17/2015   Chest pain, unspecified 10/05/2003   CHF (congestive heart failure) (HCC)     Depressive disorder, not elsewhere classified 07/31/2005   Diabetes mellitus without complication (HCC) 09/08/2006   Endometriosis 09/14/2012   Hypertension 11/23/2003   Unspecified constipation 06/24/2004               Past Surgical History:  Procedure Laterality Date   APPENDECTOMY   1996    Dr. Wiliam Ke   BREAST SURGERY Bilateral 1996    Dr. Yolanda Bonine   ESOPHAGEAL MANOMETRY N/A 03/23/2023    Procedure: ESOPHAGEAL  MANOMETRY (EM);  Surgeon: Benancio Deeds, MD;  Location: [facility];  Service: Gastroenterology;  Laterality: N/A;   INGUINAL HERNIA REPAIR       OVARIAN CYST REMOVAL       RIGHT/LEFT HEART CATH AND CORONARY ANGIOGRAPHY N/A 10/09/2021    Procedure: RIGHT/LEFT HEART CATH AND CORONARY ANGIOGRAPHY;  Surgeon: Orbie Pyo, MD;  Location: [facility];  Service: Cardiovascular;  Laterality: N/A;   TOOTH EXTRACTION N/A 07/28/2022    Procedure: EXTRACTION ALL REMAING TEETH THREE, FOUR, FIVE, SIX, SEVEN, EIGHT, NINE, TEN, ELEVEN, TWELVE, TWENTY ONE, TWENTY TWO, TWENTY THREE, TWENTY FOUR, TWENTY FIVE, TWENTY SIX, TWENTY SEVEN, TWENTY EIGHT, TWENTY NINE, THIRTY, AVEOLOPLASTY;  Surgeon: Ocie Doyne, DMD;  Location: [facility];  Service: Oral Surgery;  Laterality: N/A;               Family History  Problem Relation Age of Onset   Diabetes Mother     Hypertension Mother     Arthritis Mother          RA   Pancreatic cancer Father     Stomach cancer Neg Hx     Colon cancer Neg Hx     Esophageal cancer Neg Hx     Rectal cancer Neg Hx  SOCIAL HISTORY: Social History        Tobacco Use   Smoking status: Never   Smokeless tobacco: Never  Substance Use Topics   Alcohol use: No      Allergies       Allergies  Allergen Reactions   Aspirin Other (See Comments)      Intolerance (aspirin sensitive stomach)   Darvocet [Propoxyphene N-Acetaminophen] Rash      Skin reaction on Vaginal area   Erythromycin Nausea And Vomiting   Gabapentin Other (See Comments)      Suicidal ideations    Ibuprofen Other (See Comments)      Stomach issues   Metformin And Related Nausea Only              Current Outpatient Medications  Medication Sig Dispense Refill   aspirin 81 MG chewable tablet Chew 1 tablet (81 mg total) by mouth daily. 30 tablet 1   atorvastatin (LIPITOR) 80 MG tablet Take 1 tablet (80 mg total) by mouth daily. Please make and keep PCP appt. 90 tablet 0    clopidogrel (PLAVIX) 75 MG tablet Take 1 tablet (75 mg total) by mouth daily. 90 tablet 1   conjugated estrogens (PREMARIN) vaginal cream Insert 0.5g vaginally nightly for two weeks, then twice a week after 42.5 g 11   Continuous Blood Gluc Receiver (DEXCOM G7 RECEIVER) DEVI Use to check blood sugar 3 times daily. 1 each 0   Continuous Glucose Sensor (DEXCOM G7 SENSOR) MISC Use to check blood sugar 3 times daily. 3 each 6   dapagliflozin propanediol (FARXIGA) 10 MG TABS tablet Take 1 tablet (10 mg total) by mouth daily. 30 tablet 3   diazepam (VALIUM) 5 MG tablet Insert 1 tablet vaginally nightly as needed for muscle spasm/ pelvic pain. 15 tablet 1   fluconazole (DIFLUCAN) 150 MG tablet Take 1 tablet (150 mg total) by mouth once for 1 dose. 1 tablet 0   fosfomycin (MONUROL) 3 g PACK Take 3 g by mouth once for 1 dose. Dissolve packet in 3-4 oz of water and drink immediately. 1 each 0   Insulin Lispro Prot & Lispro (HUMALOG MIX 75/25 KWIKPEN) (75-25) 100 UNIT/ML Kwikpen Inject 32 Units into the skin in the morning AND 16 Units every evening. 30 mL 2   Insulin Pen Needle (PENTIPS) 32G X 4 MM MISC Use as directed with insulin pen 100 each 0   loperamide (IMODIUM A-D) 2 MG tablet Take 1 tablet (2 mg total) by mouth as needed for diarrhea or loose stools. 30 tablet 0   metoprolol succinate (TOPROL-XL) 25 MG 24 hr tablet Take 1 tablet (25 mg total) by mouth daily. Take with or immediately following a meal. 90 tablet 3   Multiple Vitamin (MULTIVITAMIN WITH MINERALS) TABS tablet Take 1 tablet by mouth daily.       sacubitril-valsartan (ENTRESTO) 49-51 MG Take 1 tablet by mouth 2 (two) times daily. 180 tablet 3   solifenacin (VESICARE) 10 MG tablet Take 1 tablet (10 mg total) by mouth daily. 90 tablet 3   spironolactone (ALDACTONE) 25 MG tablet Take 1 tablet (25 mg total) by mouth daily. 90 tablet 1   venlafaxine XR (EFFEXOR XR) 75 MG 24 hr capsule Take 1 capsule (75 mg total) by mouth daily with breakfast.  For headaches 30 capsule 3   vitamin B-12 (CYANOCOBALAMIN) 1000 MCG tablet Take 3,000 mcg by mouth daily.          No current facility-administered medications for this  visit.        REVIEW OF SYSTEMS:  [X]  denotes positive finding, [ ]  denotes negative finding Cardiac   Comments:  Chest pain or chest pressure:      Shortness of breath upon exertion:      Short of breath when lying flat:      Irregular heart rhythm:             Vascular      Pain in calf, thigh, or hip brought on by ambulation:      Pain in feet at night that wakes you up from your sleep:       Blood clot in your veins:      Leg swelling:              Pulmonary      Oxygen at home:      Productive cough:       Wheezing:              Neurologic      Sudden weakness in arms or legs:       Sudden numbness in arms or legs:       Sudden onset of difficulty speaking or slurred speech:      Temporary loss of vision in one eye:       Problems with dizziness:              Gastrointestinal      Blood in stool:       Vomited blood:              Genitourinary      Burning when urinating:       Blood in urine:             Psychiatric      Major depression:              Hematologic      Bleeding problems:      Problems with blood clotting too easily:             Skin      Rashes or ulcers:             Constitutional      Fever or chills:          PHYSICAL EXAM: There were no vitals filed for this visit.   GENERAL: The patient is a well-nourished female, in no acute distress. The vital signs are documented above. CARDIAC: There is a regular rate and rhythm.  VASCULAR:  Palpable common femoral pulses bilaterally No palpable pedal pulses Left hallux wound as pictured PULMONARY: No respiratory distress ABDOMEN: Soft and non-tender. MUSCULOSKELETAL: There are no major deformities or cyanosis. NEUROLOGIC: No focal weakness or paresthesias are detected. PSYCHIATRIC: The patient has a normal affect.       DATA:    ABIs on 06/21/2023 that were 0.99 on the left monophasic with a toe pressure that was 0.  On the right her ABI was 0.57.   Assessment/Plan:   62 y.o. female with history of diabetes and CHF that presents for evaluation of left hallux wound.  Patient has been followed by Dr. Lilian Kapur with podiatry.  She reports her left great toe wound occurred after a pedicure in August.  She did have ABIs on 06/21/2023 that were 0.99 on the left monophasic with a toe pressure that was 0.  I cannot palpate any pedal pulses on the left and she has a toe pressure of 0  which is inadequate for wound healing.  I have recommended aortogram, lower extremity arteriogram with a focus on the left leg.  Discussed we will evaluate for endovascular invention which would likely be tibial angioplasty.  Risk benefits discussed.  Will get scheduled at [facility].     Brooke Shelling, MD [facility] Office: [phone]

## 2023-07-29 NOTE — Progress Notes (Signed)
Pt ambulated to and from bathroom to void with no signs of oozing from right groin site  

## 2023-07-29 NOTE — Op Note (Signed)
Patient name: Brooke [name] MRN: [id number] DOB: 08-02-1961 Sex: female  07/29/2023 Pre-operative Diagnosis: Critical limb ischemia of the left lower extremity with tissue loss Post-operative diagnosis:  Same Surgeon:  Cephus Shelling, MD Procedure Performed: 1.  Ultrasound-guided access left common femoral artery 2.  Aortogram with catheter selection of aorta 3.  Left lower extremity arteriogram with selection of third order branches including the peroneal and posterior tibial artery 4.  Left below-knee popliteal artery, TP trunk, and proximal peroneal artery angioplasty (3 mm x 80 mm Sterling) 5.  Left posterior tibial artery angioplasty (2 mm x 220 mm Sterling throughout the entire length) 6.  Mynx closure of the right common femoral artery 7.  49 minutes of monitored moderate conscious sedation time  Indications: 62 year old female with multiple comorbidities including diabetes that presents with a left hallux wound.  This has been nonhealing after evaluation by podiatry.  She had monophasic waveforms at the ankle with toe pressure of 0.  She presents for lower extremity arteriogram possible invention after risks benefits discussed.  Findings:   Aortogram showed patent renal arteries bilaterally as well as a patent infrarenal aorta and patent bilateral iliacs without flow-limiting stenosis.  Left lower extremity arteriogram showed a patent common femoral and profunda.  There is some disease in the proximal SFA at the ostium of approximately 50% that did not appear flow-limiting.  The remainder of the SFA and above-knee popliteal artery was widely patent.  The posterior tibial has a high takeoff from the below-knee popliteal artery.  Distally the posterior tibial artery is diffusely diseased and small only measuring about 2 mm with multiple high-grade stenoses >80%.  The below-knee popliteal artery had an 80% stenosis.  The TP trunk was diffusely diseased with an 80% stenosis.  The  peroneal artery is the dominant runoff that was patent.  Ultimately I was able to initially select the below-knee popliteal artery TP trunk into the peroneal artery and this was treated with a 3 mm Sterling for 2 minutes with no residual stenosis.  I then selected the posterior tibial and angioplastied the entire vessel with a 2 mm Sterling with no residual stenosis.  Patient now has two-vessel runoff and optimized.   Procedure:  The patient was identified in the holding area and taken to room 8.  The patient was then placed supine on the table and prepped and draped in the usual sterile fashion.  A time out was called.  The patient received Versed and fentanyl for conscious moderate sedation.  Vital signs were monitored including heart rate, respiratory rate, oxygenation and blood pressure.  I was present for all of moderate sedation.  Ultrasound was used to evaluate the right common femoral artery.  It was patent .  A digital ultrasound image was acquired.  A micropuncture needle was used to access the right common femoral artery under ultrasound guidance.  An 018 wire was advanced without resistance and a micropuncture sheath was placed.  The 018 wire was removed and a benson wire was placed.  The micropuncture sheath was exchanged for a 5 french sheath.  An omniflush catheter was advanced over the wire to the level of L-1.  An abdominal angiogram was obtained.  Next, using the omniflush catheter and a benson wire, the aortic bifurcation was crossed and the catheter was placed into theleft external iliac artery and left runoff was obtained.  We elected for tibial intervention.  We used a Glidewire advantage down the left SFA and upsized to  a 6 French catapult sheath in the right groin over the aortic bifurcation.  Patient was given 100 units per kilogram IV heparin.  I then used a straight catheter to exchanged for a V18 and I initially went down the below-knee popliteal artery, TP trunk, into the peroneal  and this was treated with a 3 mm of 80 mm Sterling to nominal pressure for 2 minutes.  No significant residual stenosis.  Preserved runoff in the peroneal.  I then pulled the wire back and then selected the high takeoff of the posterior tibial, wire all the way down the posterior tibial into the foot.  The entire vessel was then treated with a 2 mm x 220 mm Sterling for 2 minutes.  This is a very small vessel so did not feel comfortable upsizing.  Ultimately patient is optimized with two-vessel runoff.  I did look at the SFA lesion again in multiple images and did not feel this needed to be treated and was not flow-limiting.  Wires and catheters were removed.  A short 60F sheath was placed in the right groin and a mynx closure device deployed.  Plan: Optimized after intervention today.  Aspirin statin Plavix.  Will arrange follow-up in 1 month.     Cephus Shelling, MD [facility] Office: [phone]

## 2023-07-30 ENCOUNTER — Encounter (HOSPITAL_COMMUNITY): Payer: Self-pay | Admitting: Vascular Surgery

## 2023-08-17 ENCOUNTER — Encounter: Payer: Self-pay | Admitting: Gastroenterology

## 2023-08-17 ENCOUNTER — Ambulatory Visit: Payer: Medicaid Other | Admitting: Gastroenterology

## 2023-08-17 ENCOUNTER — Other Ambulatory Visit: Payer: Self-pay

## 2023-08-17 VITALS — BP 116/72 | HR 38 | Ht 64.5 in | Wt 108.0 lb

## 2023-08-17 DIAGNOSIS — R1310 Dysphagia, unspecified: Secondary | ICD-10-CM | POA: Diagnosis not present

## 2023-08-17 DIAGNOSIS — K224 Dyskinesia of esophagus: Secondary | ICD-10-CM

## 2023-08-17 DIAGNOSIS — R194 Change in bowel habit: Secondary | ICD-10-CM

## 2023-08-17 DIAGNOSIS — K219 Gastro-esophageal reflux disease without esophagitis: Secondary | ICD-10-CM | POA: Diagnosis not present

## 2023-08-17 DIAGNOSIS — R6881 Early satiety: Secondary | ICD-10-CM

## 2023-08-17 MED ORDER — PANTOPRAZOLE SODIUM 20 MG PO TBEC
20.0000 mg | DELAYED_RELEASE_TABLET | Freq: Every day | ORAL | 3 refills | Status: DC
  Filled 2023-08-17: qty 30, 30d supply, fill #0

## 2023-08-17 NOTE — Progress Notes (Signed)
 HPI :  63 year old female, accompanied by her daughter today, here for a follow-up visit for dysphagia and altered bowel habits.  I last saw her in July.  Recall she was previously followed by Dr. Teressa. She has a past medical history as listed below including CHF (05/01/2022 echo with LVEF 55-60%) and CAD status post stent in 2023 on Plavix  and aspirin , diabetes.    Recall she has had some ongoing problems with dysphagia.  I performed an endoscopy in March which showed no stenosis or stricture but empirically dilated her to 17 mm.  She thought perhaps that provided some very short-lived benefit but since then her symptoms have gone back to baseline.  She has dysphagia to both solids and liquids which she localizes to her upper mid chest.  It seems to happen fairly routinely with most meals.  She tries to eat slowly and eat small bites.  Her appetite is down because of this.  She has been having some early satiety and poor p.o. intake lately.  Recall she had a barium swallow in October 2023 showing moderate dysmotility.  I referred her for esophageal manometry which was completed this past August.  She has an abnormal manometry with fragmented peristalsis, no abnormal contractions.  She did not meet criteria for achalasia.  She is frustrated by her persistent symptoms. Otherwise she does have some occasional reflux that has been bothering her lately.  She is not taking anything for that.  Recall she does take Plavix .  She is recently had vascular intervention on her leg for PVD.  At the last visit she complained of some loose stools.  Recall her surveillance colonoscopy as below was fairly recent without any concerning findings however no biopsies taken as I did not realize she had loose stools at the time.  Tested her for celiac disease which was negative, recommended some Imodium  as needed.  Low FODMAP diet handout given.  She states her diarrhea has since stopped.  Now she has occasional  constipation, sometimes loose stools, her stool frequency fluctuates and has had some leakage of her bowels at times.    Prior workup: Barium swallow 05/2022: IMPRESSION: 1.  Moderately decreased esophageal motility  2.  No esophageal mass, no significant esophageal stricture   EGD 10/22/22: - The Z-line was regular. - The exam of the esophagus was otherwise normal. - A guidewire was placed and the scope was withdrawn. Empiric dilation was performed in the entire esophagus with a Savary dilator with mild resistance at 16 mm and 17 mm. Relook endoscopy showed no mucosal wrents. - The entire examined stomach was normal. - The examined duodenum was normal.   Colonoscopy 10/22/22: - The perianal and digital rectal examinations were normal. - A diminutive polyp was found in the cecum. The polyp was sessile. The polyp was removed with a cold snare. Resection and retrieval were complete. - A 3 mm polyp was found in the ascending colon. The polyp was sessile. The polyp was removed with a cold snare. Resection and retrieval were complete. - The colon was tortuous - ultraslim pediatric colonsocope used for this exam. - Multiple small-mouthed diverticula were found in the sigmoid colon. - A 3 mm polyp was found in the rectum. The polyp was sessile. The polyp was removed with a cold snare. Resection and retrieval were complete. - Internal hemorrhoids were found during retroflexion. - The exam was otherwise without abnormality.   1. Surgical [P], colon, rectum, polyp (1) HYPERPLASTIC POLYP. NEGATIVE FOR DYSPLASIA.  2. Surgical [P], colon, cecum, ascending, polyp (2) ONE FRAGMENT OF TUBULAR ADENOMA. TWO FRAGMENTS OF COLONIC MUCOSA WITHOUT SIGNIFICANT DIAGNOSTIC ALTERATION. NEGATIVE FOR HIGH-GRADE DYSPLASIA.   Repeat in 5 years given advanced polyp in the past      Manometry 03/23/23: Esophageal manometry done to evaluate dysphagia. EGD with dilation did not help.   Manometry shows she has some  fragmented peristalsis of her esophagus -complete bolus clearance on 0 of 10 swallows    Past Medical History:  Diagnosis Date   Abdominal pain, left lower quadrant 12/17/2015   Anxiety 07/01/2016   Carotid artery disease (HCC) 10/27/2022   Chest pain, unspecified 10/05/2003   CHF (congestive heart failure) (HCC)    Chronic systolic heart failure (HCC) 02/20/2022   Depression, major, recurrent, moderate (HCC) 07/31/2005   Diabetes mellitus without complication (HCC) 09/08/2006   Endometriosis 03/28/1996   History of colonic polyps 08/07/2015   20 mm removed 05/10/15 by Dr. Kristie     Hypertension 11/23/2003   PAD (peripheral artery disease) (HCC)    Unspecified constipation 06/24/2004     Past Surgical History:  Procedure Laterality Date   ABDOMINAL AORTOGRAM W/LOWER EXTREMITY Left 07/29/2023   Procedure: ABDOMINAL AORTOGRAM W/LOWER EXTREMITY;  Surgeon: Gretta Lonni PARAS, MD;  Location: [facility] INVASIVE CV LAB;  Service: Cardiovascular;  Laterality: Left;   APPENDECTOMY  1996   Dr. Buford   BREAST SURGERY Bilateral 1996   Dr. Ardeen   ESOPHAGEAL MANOMETRY N/A 03/23/2023   Procedure: ESOPHAGEAL MANOMETRY (EM);  Surgeon: Leigh Elspeth SQUIBB, MD;  Location: [facility] ENDOSCOPY;  Service: Gastroenterology;  Laterality: N/A;   INGUINAL HERNIA REPAIR     OVARIAN CYST REMOVAL     PERIPHERAL VASCULAR BALLOON ANGIOPLASTY Left 07/29/2023   Procedure: PERIPHERAL VASCULAR BALLOON ANGIOPLASTY;  Surgeon: Gretta Lonni PARAS, MD;  Location: [facility] INVASIVE CV LAB;  Service: Cardiovascular;  Laterality: Left;   RIGHT/LEFT HEART CATH AND CORONARY ANGIOGRAPHY N/A 10/09/2021   Procedure: RIGHT/LEFT HEART CATH AND CORONARY ANGIOGRAPHY;  Surgeon: Wendel Lurena POUR, MD;  Location: [facility] INVASIVE CV LAB;  Service: Cardiovascular;  Laterality: N/A;   TOOTH EXTRACTION N/A 07/28/2022   Procedure: EXTRACTION ALL REMAING TEETH THREE, FOUR, FIVE, SIX, SEVEN, EIGHT, NINE, TEN, ELEVEN, TWELVE, TWENTY ONE, TWENTY TWO, TWENTY  THREE, TWENTY FOUR, TWENTY FIVE, TWENTY SIX, TWENTY SEVEN, TWENTY EIGHT, TWENTY NINE, THIRTY, AVEOLOPLASTY;  Surgeon: Sheryle Hamilton, DMD;  Location: [facility] OR;  Service: Oral Surgery;  Laterality: N/A;   Family History  Problem Relation Age of Onset   Diabetes Mother    Hypertension Mother    Arthritis Mother        RA   Pancreatic cancer Father    Stomach cancer Neg Hx    Colon cancer Neg Hx    Esophageal cancer Neg Hx    Rectal cancer Neg Hx    Social History   Tobacco Use   Smoking status: Never   Smokeless tobacco: Never  Vaping Use   Vaping status: Never Used  Substance Use Topics   Alcohol use: No   Drug use: No   Current Outpatient Medications  Medication Sig Dispense Refill   aspirin  81 MG chewable tablet Chew 1 tablet (81 mg total) by mouth daily. 30 tablet 1   atorvastatin  (LIPITOR ) 80 MG tablet Take 1 tablet (80 mg total) by mouth daily. Please make and keep PCP appt. 90 tablet 0   clopidogrel  (PLAVIX ) 75 MG tablet Take 1 tablet (75 mg total) by mouth daily. 90 tablet 1  conjugated estrogens  (PREMARIN ) vaginal cream Insert 0.5g vaginally nightly for two weeks, then twice a week after 42.5 g 11   Continuous Blood Gluc Receiver (DEXCOM G7 RECEIVER) DEVI Use to check blood sugar 3 times daily. 1 each 0   Continuous Glucose Sensor (DEXCOM G7 SENSOR) MISC Use to check blood sugar 3 times daily. 3 each 6   dapagliflozin  propanediol (FARXIGA ) 10 MG TABS tablet Take 1 tablet (10 mg total) by mouth daily. 30 tablet 3   diazepam  (VALIUM ) 5 MG tablet Insert 1 tablet vaginally nightly as needed for muscle spasm/ pelvic pain. 15 tablet 1   Insulin  Lispro Prot & Lispro (HUMALOG  MIX 75/25 KWIKPEN) (75-25) 100 UNIT/ML Kwikpen Inject 32 Units into the skin in the morning AND 16 Units every evening. 30 mL 2   Insulin  Pen Needle (PENTIPS) 32G X 4 MM MISC Use as directed with insulin  pen 100 each 0   loperamide  (IMODIUM  A-D) 2 MG tablet Take 1 tablet (2 mg total) by mouth as needed for  diarrhea or loose stools. 30 tablet 0   metoprolol  succinate (TOPROL -XL) 25 MG 24 hr tablet Take 1 tablet (25 mg total) by mouth daily. Take with or immediately following a meal. Please schedule PCP appt for more refills. 30 tablet 0   Multiple Vitamin (MULTIVITAMIN WITH MINERALS) TABS tablet Take 1 tablet by mouth daily.     sacubitril -valsartan  (ENTRESTO ) 49-51 MG Take 1 tablet by mouth 2 (two) times daily. 180 tablet 3   solifenacin  (VESICARE ) 10 MG tablet Take 1 tablet (10 mg total) by mouth daily. 90 tablet 3   spironolactone  (ALDACTONE ) 25 MG tablet Take 1 tablet (25 mg total) by mouth daily. 90 tablet 1   venlafaxine  XR (EFFEXOR  XR) 75 MG 24 hr capsule Take 1 capsule (75 mg total) by mouth daily with breakfast. For headaches 30 capsule 3   vitamin B-12 (CYANOCOBALAMIN) 1000 MCG tablet Take 3,000 mcg by mouth daily.     No current facility-administered medications for this visit.   Allergies  Allergen Reactions   Aspirin  Other (See Comments)    Intolerance (aspirin  sensitive stomach)   Darvocet [Propoxyphene N-Acetaminophen ] Rash    Skin reaction on Vaginal area   Erythromycin Nausea And Vomiting   Gabapentin Other (See Comments)    Suicidal ideations    Ibuprofen Other (See Comments)    Stomach issues   Metformin  And Related Nausea Only     Review of Systems: All systems reviewed and negative except where noted in HPI.   Lab Results  Component Value Date   WBC 11.5 (H) 07/28/2022   HGB 11.6 (L) 07/29/2023   HCT 34.0 (L) 07/29/2023   MCV 83.3 07/28/2022   PLT 337 07/28/2022    Lab Results  Component Value Date   NA 138 07/29/2023   CL 99 07/29/2023   K 3.6 07/29/2023   CO2 25 02/23/2023   BUN 14 07/29/2023   CREATININE 0.60 07/29/2023   EGFR 76 02/23/2023   CALCIUM  9.5 02/23/2023   ALBUMIN 4.1 02/23/2023   GLUCOSE 257 (H) 07/29/2023    Lab Results  Component Value Date   ALT 20 02/23/2023   AST 24 02/23/2023   ALKPHOS 98 02/23/2023   BILITOT 0.3  02/23/2023     Physical Exam: BP 116/72   Pulse (!) 38   Ht 5' 4.5 (1.638 m)   Wt 108 lb (49 kg)   LMP 11/16/2014 (Approximate)   BMI 18.25 kg/m  Constitutional: Pleasant,well-developed, female in no acute  distress. Neurological: Alert and oriented to person place and time. Psychiatric: Normal mood and affect. Behavior is normal.   ASSESSMENT: 64 y.o. female here for assessment of the following  1. Esophageal dysmotility   2. Dysphagia, unspecified type   3. Gastroesophageal reflux disease, unspecified whether esophagitis present   4. Early satiety   5. Altered bowel habits    Persistent dysphagia secondary to esophageal dysmotility.  She has incomplete/fragmented peristalsis, dysmotility is causing her symptoms and I discussed what this is with her.  Unfortunately we do not have a good medical therapy for this.  I did offer her a referral to rheumatology to assess for autoimmune diseases which could potentially be related.  She was interested in pursuing this.  She has had some reflux symptoms recently and some poor appetite with early satiety.  With her diabetes, at risk for gastroparesis.  I will refer her for a gastric emptying study.  She was interested in pursuing that.  In interim we will start her on Protonix  20 mg daily to see if that will help at all.  Otherwise chew food well, eat slowly, avoid large meals.  In regards to her bowel habits, persistent loose stools have resolved, now alternating between loose stools and constipation.  Recommend trial of Citrucel once daily to provide some regularity and help with leakage.  If this does not provide benefit she can contact me for reassessment.  PLAN: - refer her to Rheumatology for esophageal dysmotility - chew food well, small bites - refer for gastric emptying study - start protonix  20mg  / day for 30 day trial with refills - start Citrucel once daily  Brooke Naval, MD [facility]

## 2023-08-17 NOTE — Patient Instructions (Signed)
 You have been scheduled for a gastric emptying scan at [facility] on Friday, 1-17 at 8:00am. Please arrive at least 15 minutes prior to your appointment for registration. Please make certain not to have anything to eat or drink after midnight the night before your test. Hold all stomach medications (ex: Zofran , phenergan , Reglan ) 24 hours prior to your test. If you need to reschedule your appointment, please contact radiology scheduling at [phone]. ____________________________________________________________ A gastric-emptying study measures how long it takes for food to move through your stomach. There are several ways to measure stomach emptying. In the most common test, you eat food that contains a small amount of radioactive material. A scanner that detects the movement of the radioactive material is placed over your abdomen to monitor the rate at which food leaves your stomach. This test normally takes about 4 hours to complete. __________________________________________________________  We have sent the following medications to your pharmacy for you to pick up at your convenience: Protonix  20 mg: Take once daily  We are referring you to Rheumatology.  They will contact you directly to schedule an appointment.  It may take a week or more before you hear from them.  Please feel free to contact us  if you have not heard from them within 2 weeks and we will follow up on the referral.   Please purchase the following medications over the counter and take as directed:  Citrucel - take daily  Thank you for entrusting me with your care and for choosing [facility], Dr. Elspeth Naval  If your blood pressure at your visit was 140/90 or greater, please contact your primary care physician to follow up on this. ______________________________________________________  If you are age 73 or older, your body mass index should be between 23-30. Your Body mass index is 18.25 kg/m. If  this is out of the aforementioned range listed, please consider follow up with your Primary Care Provider.  If you are age 42 or younger, your body mass index should be between 19-25. Your Body mass index is 18.25 kg/m. If this is out of the aformentioned range listed, please consider follow up with your Primary Care Provider.  ________________________________________________________  The [facility] providers would like to encourage you to use MYCHART to communicate with providers for non-urgent requests or questions.  Due to long hold times on the telephone, sending your provider a message by [facility] may be a faster and more efficient way to get a response.  Please allow 48 business hours for a response.  Please remember that this is for non-urgent requests.  _______________________________________________________  Due to recent changes in healthcare laws, you may see the results of your imaging and laboratory studies on MyChart before your provider has had a chance to review them.  We understand that in some cases there may be results that are confusing or concerning to you. Not all laboratory results come back in the same time frame and the provider may be waiting for multiple results in order to interpret others.  Please give us  48 hours in order for your provider to thoroughly review all the results before contacting the office for clarification of your results.

## 2023-08-24 ENCOUNTER — Other Ambulatory Visit: Payer: Self-pay

## 2023-08-26 ENCOUNTER — Other Ambulatory Visit: Payer: Self-pay

## 2023-08-26 DIAGNOSIS — I739 Peripheral vascular disease, unspecified: Secondary | ICD-10-CM

## 2023-08-27 ENCOUNTER — Encounter (HOSPITAL_COMMUNITY)
Admission: RE | Admit: 2023-08-27 | Discharge: 2023-08-27 | Disposition: A | Discharge status: Home / Self Care | Payer: Medicaid Other | Source: Ambulatory Visit | Attending: Gastroenterology

## 2023-08-27 DIAGNOSIS — R1310 Dysphagia, unspecified: Secondary | ICD-10-CM | POA: Diagnosis not present

## 2023-08-27 DIAGNOSIS — R194 Change in bowel habit: Secondary | ICD-10-CM | POA: Diagnosis not present

## 2023-08-27 DIAGNOSIS — K219 Gastro-esophageal reflux disease without esophagitis: Secondary | ICD-10-CM | POA: Diagnosis not present

## 2023-08-27 DIAGNOSIS — K224 Dyskinesia of esophagus: Secondary | ICD-10-CM | POA: Insufficient documentation

## 2023-08-27 DIAGNOSIS — R6881 Early satiety: Secondary | ICD-10-CM | POA: Insufficient documentation

## 2023-08-27 MED ORDER — TECHNETIUM TC 99M SULFUR COLLOID
2.2000 | Freq: Once | INTRAVENOUS | Status: AC
  Administered 2023-08-27: 2.2 via INTRAVENOUS

## 2023-08-27 MED ORDER — TECHNETIUM TC 99M SULFUR COLLOID: 0.2000 | Freq: Once | INTRAVENOUS | Status: DC

## 2023-09-01 ENCOUNTER — Ambulatory Visit (HOSPITAL_COMMUNITY)
Admission: RE | Admit: 2023-09-01 | Discharge: 2023-09-01 | Disposition: A | Discharge status: Home / Self Care | Payer: Medicaid Other | Source: Ambulatory Visit | Attending: Vascular Surgery | Admitting: Vascular Surgery

## 2023-09-01 ENCOUNTER — Ambulatory Visit (INDEPENDENT_AMBULATORY_CARE_PROVIDER_SITE_OTHER)
Admission: RE | Admit: 2023-09-01 | Discharge: 2023-09-01 | Disposition: A | Discharge status: Home / Self Care | Payer: Medicaid Other | Source: Ambulatory Visit | Attending: Vascular Surgery | Admitting: Vascular Surgery

## 2023-09-01 DIAGNOSIS — I739 Peripheral vascular disease, unspecified: Secondary | ICD-10-CM | POA: Insufficient documentation

## 2023-09-02 LAB — VAS US ABI WITH/WO TBI
Left ABI: 1.16
Right ABI: 0.86

## 2023-09-07 DIAGNOSIS — M5416 Radiculopathy, lumbar region: Secondary | ICD-10-CM | POA: Diagnosis not present

## 2023-09-14 ENCOUNTER — Ambulatory Visit (INDEPENDENT_AMBULATORY_CARE_PROVIDER_SITE_OTHER): Payer: Medicaid Other | Admitting: Physician Assistant

## 2023-09-14 VITALS — BP 122/88 | HR 129 | Temp 97.6°F | Wt 103.2 lb

## 2023-09-14 DIAGNOSIS — I739 Peripheral vascular disease, unspecified: Secondary | ICD-10-CM

## 2023-09-14 DIAGNOSIS — I7025 Atherosclerosis of native arteries of other extremities with ulceration: Secondary | ICD-10-CM

## 2023-09-14 NOTE — Progress Notes (Signed)
 Office Note     CC:  follow up Requesting Provider:  [name], MD  HPI: Brooke [name] is a 63 y.o. (May 14, 1961) female who presents for follow up after Aortogram, Arteriogram of LLE with left below knee popliteal, TPT trunk, and proximal peroneal artery angioplasty and left PT angioplasty by Dr. Gretta on 07/29/23. Noted to have two vessel runoff via the PT and Peroneal arteries. She was discharged on Aspirin , Statin and Plavix . This was performed due to left Hallux wound.   Today she is here to follow up after her noninvasive studies. she reports her legs have been feeling good. She did have a fall two days after new years and scraped her left knee up. She says ever since then she has had an intermittent little sharp pain down the left lower leg. She denies any pain on ambulation or rest. Her left great toe wound has healed up. She does still have a dark spot under her left 2nd toe. She is followed by Dr. Silva with Podiatry. She does walk but reports that she does not do very much because she has a lot of chronic low back pain. She is compliant with her Aspirin , Statin and Plavix .   Past Medical History:  Diagnosis Date   Abdominal pain, left lower quadrant 12/17/2015   Anxiety 07/01/2016   Carotid artery disease (HCC) 10/27/2022   Chest pain, unspecified 10/05/2003   CHF (congestive heart failure) (HCC)    Chronic systolic heart failure (HCC) 02/20/2022   Depression, major, recurrent, moderate (HCC) 07/31/2005   Diabetes mellitus without complication (HCC) 09/08/2006   Endometriosis 03/28/1996   History of colonic polyps 08/07/2015   20 mm removed 05/10/15 by Dr. Kristie     Hypertension 11/23/2003   PAD (peripheral artery disease) (HCC)    Unspecified constipation 06/24/2004    Past Surgical History:  Procedure Laterality Date   ABDOMINAL AORTOGRAM W/LOWER EXTREMITY Left 07/29/2023   Procedure: ABDOMINAL AORTOGRAM W/LOWER EXTREMITY;  Surgeon: Gretta Lonni PARAS, MD;   Location: [facility] INVASIVE CV LAB;  Service: Cardiovascular;  Laterality: Left;   APPENDECTOMY  1996   Dr. Buford   BREAST SURGERY Bilateral 1996   Dr. Ardeen   ESOPHAGEAL MANOMETRY N/A 03/23/2023   Procedure: ESOPHAGEAL MANOMETRY (EM);  Surgeon: Leigh Elspeth SQUIBB, MD;  Location: [facility] ENDOSCOPY;  Service: Gastroenterology;  Laterality: N/A;   INGUINAL HERNIA REPAIR     OVARIAN CYST REMOVAL     PERIPHERAL VASCULAR BALLOON ANGIOPLASTY Left 07/29/2023   Procedure: PERIPHERAL VASCULAR BALLOON ANGIOPLASTY;  Surgeon: Gretta Lonni PARAS, MD;  Location: [facility] INVASIVE CV LAB;  Service: Cardiovascular;  Laterality: Left;   RIGHT/LEFT HEART CATH AND CORONARY ANGIOGRAPHY N/A 10/09/2021   Procedure: RIGHT/LEFT HEART CATH AND CORONARY ANGIOGRAPHY;  Surgeon: Wendel Lurena POUR, MD;  Location: [facility] INVASIVE CV LAB;  Service: Cardiovascular;  Laterality: N/A;   TOOTH EXTRACTION N/A 07/28/2022   Procedure: EXTRACTION ALL REMAING TEETH THREE, FOUR, FIVE, SIX, SEVEN, EIGHT, NINE, TEN, ELEVEN, TWELVE, TWENTY ONE, TWENTY TWO, TWENTY THREE, TWENTY FOUR, TWENTY FIVE, TWENTY SIX, TWENTY SEVEN, TWENTY EIGHT, TWENTY NINE, THIRTY, AVEOLOPLASTY;  Surgeon: Sheryle Hamilton, DMD;  Location: [facility] OR;  Service: Oral Surgery;  Laterality: N/A;    Social History   Socioeconomic History   Marital status: Married    Spouse name: Not on file   Number of children: 1   Years of education: Not on file   Highest education level: Not on file  Occupational History   Not on file  Tobacco Use  Smoking status: Never   Smokeless tobacco: Never  Vaping Use   Vaping status: Never Used  Substance and Sexual Activity   Alcohol use: No   Drug use: No   Sexual activity: Not Currently  Other Topics Concern   Not on file  Social History Narrative   Not on file   Social Drivers of Health   Financial Resource Strain: Medium Risk (10/31/2021)   Overall Financial Resource Strain (CARDIA)    Difficulty of Paying Living Expenses: Somewhat hard   Food Insecurity: No Food Insecurity (10/31/2021)   Hunger Vital Sign    Worried About Running Out of Food in the Last Year: Never true    Ran Out of Food in the Last Year: Never true  Transportation Needs: No Transportation Needs (10/31/2021)   PRAPARE - Administrator, Civil Service (Medical): No    Lack of Transportation (Non-Medical): No  Physical Activity: Not on file  Stress: Not on file  Social Connections: Not on file  Intimate Partner Violence: Not on file    Family History  Problem Relation Age of Onset   Diabetes Mother    Hypertension Mother    Arthritis Mother        RA   Pancreatic cancer Father    Stomach cancer Neg Hx    Colon cancer Neg Hx    Esophageal cancer Neg Hx    Rectal cancer Neg Hx     Current Outpatient Medications  Medication Sig Dispense Refill   aspirin  81 MG chewable tablet Chew 1 tablet (81 mg total) by mouth daily. 30 tablet 1   atorvastatin  (LIPITOR ) 80 MG tablet Take 1 tablet (80 mg total) by mouth daily. Please make and keep PCP appt. 90 tablet 0   clopidogrel  (PLAVIX ) 75 MG tablet Take 1 tablet (75 mg total) by mouth daily. 90 tablet 1   conjugated estrogens  (PREMARIN ) vaginal cream Insert 0.5g vaginally nightly for two weeks, then twice a week after 42.5 g 11   Continuous Blood Gluc Receiver (DEXCOM G7 RECEIVER) DEVI Use to check blood sugar 3 times daily. 1 each 0   Continuous Glucose Sensor (DEXCOM G7 SENSOR) MISC Use to check blood sugar 3 times daily. 3 each 6   dapagliflozin  propanediol (FARXIGA ) 10 MG TABS tablet Take 1 tablet (10 mg total) by mouth daily. 30 tablet 3   diazepam  (VALIUM ) 5 MG tablet Insert 1 tablet vaginally nightly as needed for muscle spasm/ pelvic pain. 15 tablet 1   Insulin  Lispro Prot & Lispro (HUMALOG  MIX 75/25 KWIKPEN) (75-25) 100 UNIT/ML Kwikpen Inject 32 Units into the skin in the morning AND 16 Units every evening. 30 mL 2   Insulin  Pen Needle (PENTIPS) 32G X 4 MM MISC Use as directed with insulin   pen 100 each 0   loperamide  (IMODIUM  A-D) 2 MG tablet Take 1 tablet (2 mg total) by mouth as needed for diarrhea or loose stools. 30 tablet 0   metoprolol  succinate (TOPROL -XL) 25 MG 24 hr tablet Take 1 tablet (25 mg total) by mouth daily. Take with or immediately following a meal. Please schedule PCP appt for more refills. 30 tablet 0   Multiple Vitamin (MULTIVITAMIN WITH MINERALS) TABS tablet Take 1 tablet by mouth daily.     pantoprazole  (PROTONIX ) 20 MG tablet Take 1 tablet (20 mg total) by mouth daily. 30 tablet 3   sacubitril -valsartan  (ENTRESTO ) 49-51 MG Take 1 tablet by mouth 2 (two) times daily. 180 tablet 3   solifenacin  (VESICARE )  10 MG tablet Take 1 tablet (10 mg total) by mouth daily. 90 tablet 3   spironolactone  (ALDACTONE ) 25 MG tablet Take 1 tablet (25 mg total) by mouth daily. 90 tablet 1   venlafaxine  XR (EFFEXOR  XR) 75 MG 24 hr capsule Take 1 capsule (75 mg total) by mouth daily with breakfast. For headaches 30 capsule 3   vitamin B-12 (CYANOCOBALAMIN) 1000 MCG tablet Take 3,000 mcg by mouth daily.     No current facility-administered medications for this visit.    Allergies  Allergen Reactions   Aspirin  Other (See Comments)    Intolerance (aspirin  sensitive stomach)   Darvocet [Propoxyphene N-Acetaminophen ] Rash    Skin reaction on Vaginal area   Erythromycin Nausea And Vomiting   Gabapentin Other (See Comments)    Suicidal ideations    Ibuprofen Other (See Comments)    Stomach issues   Metformin  And Related Nausea Only     REVIEW OF SYSTEMS:  [X]  denotes positive finding, [ ]  denotes negative finding Cardiac  Comments:  Chest pain or chest pressure:    Shortness of breath upon exertion:    Short of breath when lying flat:    Irregular heart rhythm:        Vascular    Pain in calf, thigh, or hip brought on by ambulation:    Pain in feet at night that wakes you up from your sleep:     Blood clot in your veins:    Leg swelling:         Pulmonary    Oxygen  at home:    Productive cough:     Wheezing:         Neurologic    Sudden weakness in arms or legs:     Sudden numbness in arms or legs:     Sudden onset of difficulty speaking or slurred speech:    Temporary loss of vision in one eye:     Problems with dizziness:         Gastrointestinal    Blood in stool:     Vomited blood:         Genitourinary    Burning when urinating:     Blood in urine:        Psychiatric    Major depression:         Hematologic    Bleeding problems:    Problems with blood clotting too easily:        Skin    Rashes or ulcers:        Constitutional    Fever or chills:      PHYSICAL EXAMINATION:  Vitals:   09/14/23 0931  BP: 122/88  Pulse: (!) 129  Temp: 97.6 F (36.4 C)  TempSrc: Temporal  Weight: 103 lb 3.2 oz (46.8 kg)    General:  WDWN in NAD; vital signs documented above Gait: Normal HENT: WNL, normocephalic Pulmonary: normal non-labored breathing , without wheezing Cardiac: regular HR Abdomen: soft Vascular Exam/Pulses: 2+ radial pulse, 2+ femoral pulses, left PT palpable, no palpable distal pulses on the right. Feet warm and well perfused Extremities: without ischemic changes, without Gangrene , without cellulitis; without open wounds; Left great toe wound healed. Dark spot on left 2nd toe unchanged Musculoskeletal: no muscle wasting or atrophy  Neurologic: A&O X 3 Psychiatric:  The pt has Normal affect.   Non-Invasive Vascular Imaging:   +-------+-----------+-----------+------------+------------+  ABI/TBIToday's ABIToday's TBIPrevious ABIPrevious TBI  +-------+-----------+-----------+------------+------------+  Right 0.86  0.52       0.57        0.55          +-------+-----------+-----------+------------+------------+  Left  1.16       0.34       0.99        0.00          +-------+-----------+-----------+------------+------------+  Right ABIs appear increased. Left ABIs and TBIs appear increased.    VAS US  Lower extremity arterial duplex: +-----------+--------+-----+---------------+----------+--------+  LEFT      PSV cm/sRatioStenosis       Waveform  Comments  +-----------+--------+-----+---------------+----------+--------+  EIA Distal 63                          biphasic            +-----------+--------+-----+---------------+----------+--------+  CFA Prox   116                         biphasic            +-----------+--------+-----+---------------+----------+--------+  CFA Distal 105                         biphasic            +-----------+--------+-----+---------------+----------+--------+  DFA       96                          biphasic            +-----------+--------+-----+---------------+----------+--------+  SFA Prox   284          75-99% stenosisbiphasic            +-----------+--------+-----+---------------+----------+--------+  SFA Mid    74                          biphasic            +-----------+--------+-----+---------------+----------+--------+  SFA Distal 88                          biphasic            +-----------+--------+-----+---------------+----------+--------+  POP Prox   37                          triphasic           +-----------+--------+-----+---------------+----------+--------+  POP Mid    113                                   plaque    +-----------+--------+-----+---------------+----------+--------+  POP Distal 54                          biphasic            +-----------+--------+-----+---------------+----------+--------+  ATA Distal 18                          monophasic          +-----------+--------+-----+---------------+----------+--------+  PTA Distal 21                          monophasic          +-----------+--------+-----+---------------+----------+--------+  PERO Distal38                          biphasic             +-----------+--------+-----+---------------+----------+--------+   Summary:  Left: 75-99% stenosis noted in the superficial femoral artery.   ASSESSMENT/PLAN:: 63 y.o. female here for follow up after Aortogram, Arteriogram of LLE with left below knee popliteal, TPT trunk, and proximal peroneal artery angioplasty and left PT angioplasty by Dr. Gretta on 07/29/23. Noted to have two vessel runoff via the PT and Peroneal arteries. This was performed due to left Hallux wound. Her wound is resolved. She is without rest pain or claudication. No further tissue loss.  - Improved ABI/TBI on LLE, RLE also improved - Duplex shows patent flow throughout LLE. There is some elevated velocities in the left SFA. This area was seen on recent angiogram and was about 50% stenosis and not flow limiting. No intervention indicated at this time - Aspirin , Statin and Plavix .  - She will continue to follow up with Dr. Silva for foot care - encourage walking regimen - She will follow up in 6 months with ABI and LLE arterial duplex    Teretha Damme, PA-C [facility] [phone]  Clinic MD:   Magda

## 2023-09-15 ENCOUNTER — Encounter: Payer: Self-pay | Admitting: Physician Assistant

## 2023-09-20 ENCOUNTER — Other Ambulatory Visit: Payer: Self-pay

## 2023-09-20 ENCOUNTER — Other Ambulatory Visit: Payer: Self-pay | Admitting: Family Medicine

## 2023-09-20 ENCOUNTER — Other Ambulatory Visit: Payer: Self-pay | Admitting: Obstetrics and Gynecology

## 2023-09-20 DIAGNOSIS — Z794 Long term (current) use of insulin: Secondary | ICD-10-CM

## 2023-09-23 ENCOUNTER — Other Ambulatory Visit: Payer: Self-pay

## 2023-09-23 NOTE — Telephone Encounter (Signed)
I've contacted the pharmacy to informed them patient is taking medication incorrectly and they have documented it.  Also attempted to contact patient no answer, LVMTRC.

## 2023-09-24 ENCOUNTER — Other Ambulatory Visit: Payer: Self-pay

## 2023-09-24 DIAGNOSIS — I739 Peripheral vascular disease, unspecified: Secondary | ICD-10-CM

## 2023-09-28 NOTE — Telephone Encounter (Signed)
Patient has been notified of correct usage of medication, patient had clear understanding.

## 2023-09-29 ENCOUNTER — Other Ambulatory Visit: Payer: Self-pay

## 2023-10-08 NOTE — Progress Notes (Signed)
 Cardiology Office Note:  .   Date:  10/11/2023  ID:  Brooke [name], DOB 1960-11-29, MRN [id number] PCP: Brooke Register, MD  [facility] Providers Cardiologist:  Brooke Schultz, MD    Patient Profile: .      PMH Coronary artery disease Chronic HFrEF Esophageal dysphagia Hyperlipidemia Type 2 diabetes mellitus PAD S/p angioplasty to LLE 07/29/2023  She was initially seen during admission 10/08/2021 for shortness of breath and was found to have cardiogenic shock with biventricular heart failure.  She was found to have sinus tachycardia with elevated lactate and hypoxic respiratory failure presentation. BNP and troponins were elevated and she was placed on BiPAP and treated with diuretics with good response. Chest CT negative for PE, positive for moderate bilateral pleural effusions and multivessel coronary artery calcifications.  2D echo showed EF 20 to 25% and she was started on milrinone and low-dose captopril for afterload reduction.  R/LHC showed severe multivessel disease with severe LV dysfunction.  RHC showed low filling pressure with Fick cardiac output of 5.2 L/min and Fick cardiac index of 3.4 L/min, PA sat 68%.  AHF team was consulted for further management.  She was consulted by CVTS and found not to be a good candidate for bypass surgery given diffuse disease and coronary stent with no good targets.  Medical therapy was recommended and she was placed on ASA and Plavix.  Cardiac MRI 10/14/2021 revealed normal LV size with EF 24%, normal RV size with EF 48%, coronary disease type LGE.  At follow-up visit 11/25/2021 she noted mild exertional fatigue and dyspnea with NYHA class II symptoms.  She reported compliance with medication but had accidentally stopped her digoxin which was restarted.  Repeat echo 05/01/2022 with EF 55 to 60%, G1 DD, and no significant valve disease.  Last cardiology clinic visit was 04/07/2023 with Dr. Anne [name]. She reported difficulty swallowing causing coughing.   She reported persistent cough leads to chest discomfort which she attributes to muscular pain.  Weight and blood pressure were stable.  She was advised to return in 6 months for follow-up.  She had abnormal ABIs 07/2023 and underwent angiogram by Dr. Chestine [name] with angioplasty to left below-knee popliteal artery and left posterior tibial artery on 07/29/2023.       History of Present Illness: .   Brooke [name] is a very pleasant 63 y.o. female who is here for follow-up of heart failure. She reports she is doing well but is starting to piece things together that seem to be related. Has concerns about leg instability and gastrointestinal issues. She reports that her legs will "crisscross over each other," causing her to lose balance and fall. She also reports a recent fall where she landed on her left side, causing a significant bruise.  She reports no  leg claudication since angioplasty 07/2023. In addition to the leg instability, she has been seeing GI for bowel problems, ranging from diarrhea to normal stools. She also reports instances of fecal incontinence, with accidents occurring without warning. She has undergone a series of tests and has been referred to a rheumatologist to investigate the possibility of an autoimmune disease causing these symptoms. She also reports difficulty swallowing, which has led to instances of choking. She has had her esophagus checked and stretched, but the issue persists. She has to take small bites and chew her food thoroughly to avoid choking. She expresses a desire to return to her normal activities, such as mountain climbing and trail walking. She has been trying to do  exercises to strengthen her muscles. She denies shortness of breath, chest pain, palpitations, orthopnea, PND, edema, presyncope or syncope.   Discussed the use of AI scribe software for clinical note transcription with the patient, who gave verbal consent to proceed.   ROS: See HPI       Studies  Reviewed: Marland Kitchen   EKG Interpretation Date/Time:  Monday October 11 2023 16:18:17 EST Ventricular Rate:  89 PR Interval:  134 QRS Duration:  94 QT Interval:  356 QTC Calculation: 433 R Axis:   20  Text Interpretation: Normal sinus rhythm Minimal voltage criteria for LVH, may be normal variant ( Cornell product ) Cannot rule out Anterior infarct , age undetermined When compared with ECG of 31-Oct-2021 14:38, No significant change was found Confirmed by Brooke [name] [phone]) on 10/11/2023 4:22:36 PM     Risk Assessment/Calculations:             Physical Exam:   VS:  BP 106/70   Pulse 89   Ht 5' 4.5" (1.638 m)   Wt 106 lb (48.1 kg)   LMP 11/16/2014 (Approximate)   BMI 17.91 kg/m    Wt Readings from Last 3 Encounters:  10/11/23 106 lb (48.1 kg)  09/14/23 103 lb 3.2 oz (46.8 kg)  08/17/23 108 lb (49 kg)    GEN: Well nourished, well developed in no acute distress NECK: No JVD; No carotid bruits CARDIAC: RRR, no murmurs, rubs, gallops RESPIRATORY:  Clear to auscultation without rales, wheezing or rhonchi  ABDOMEN: Soft, non-tender, non-distended EXTREMITIES:  No edema; No deformity     ASSESSMENT AND PLAN: .    Chronic HFrEF: Admission with acute heart failure 10/2021 with EF 20-25%, with improvement to 55-60% on echo 04/2022 on GDMT.  She denies shortness of breath, orthopnea, PND, edema.  She appears euvolemic on exam.  Weight is stable.  She admits to high water intake but says she has continued to do this for years and has no signs of volume overload. She had a UTI since starting Farxiga, but this has not recurred. Continue to monitor for frequent UTI.  We will continue GDMT including Farxiga, metoprolol, Entresto, spironolactone.  CAD: Severe multivessel CAD on cath 10/09/2021 felt not to be a candidate for bypass, medical management recommended. She denies chest pain, dyspnea, or other symptoms concerning for angina.  No indication for further ischemic evaluation at this time. We will  update lipid panel to ensure lipids are well controlled.  We refilled her metoprolol today.  No bleeding concerns.  Continue aspirin, atorvastatin, clopidogrel, Farxiga, metoprolol, Entresto, Aldactone.  Dysphagia: She has history of esophageal stricture with previous stretching. She continues to have difficulty swallowing, requiring small bites and careful eating. Continue careful eating and follow-up with GI as advised.  Hypertension: BP is well-controlled.  Renal function stable on labs completed 07/29/2023.  No medication changes today.  PAD: Left lower extremity angioplasty 07/28/2024.  She denies claudication and is doing some leg exercises to improve leg strength.  She is anxious to return to regular hiking.  Management per vascular surgery.  Hyperlipidemia LDL goal < 55: Lipid panel 02/2022 with total cholesterol 147, HDL 42, LDL 78, and triglycerides 119.  We will repeat lipid panel when she can return fasting. Continue atorvastatin.       Disposition:6 months with Dr. Anne [name]  Signed, Brooke Bridegroom, NP-C

## 2023-10-11 ENCOUNTER — Encounter: Payer: Self-pay | Admitting: Nurse Practitioner

## 2023-10-11 ENCOUNTER — Encounter: Payer: Self-pay | Admitting: *Deleted

## 2023-10-11 ENCOUNTER — Other Ambulatory Visit: Payer: Self-pay

## 2023-10-11 ENCOUNTER — Other Ambulatory Visit: Payer: Self-pay | Admitting: *Deleted

## 2023-10-11 ENCOUNTER — Ambulatory Visit: Payer: Medicaid Other | Attending: Nurse Practitioner | Admitting: Nurse Practitioner

## 2023-10-11 VITALS — BP 106/70 | HR 89 | Ht 64.5 in | Wt 106.0 lb

## 2023-10-11 DIAGNOSIS — R1319 Other dysphagia: Secondary | ICD-10-CM | POA: Diagnosis not present

## 2023-10-11 DIAGNOSIS — E785 Hyperlipidemia, unspecified: Secondary | ICD-10-CM | POA: Diagnosis not present

## 2023-10-11 DIAGNOSIS — I5022 Chronic systolic (congestive) heart failure: Secondary | ICD-10-CM

## 2023-10-11 DIAGNOSIS — I2510 Atherosclerotic heart disease of native coronary artery without angina pectoris: Secondary | ICD-10-CM

## 2023-10-11 DIAGNOSIS — I739 Peripheral vascular disease, unspecified: Secondary | ICD-10-CM

## 2023-10-11 DIAGNOSIS — I10 Essential (primary) hypertension: Secondary | ICD-10-CM

## 2023-10-11 MED ORDER — METOPROLOL SUCCINATE ER 25 MG PO TB24
25.0000 mg | ORAL_TABLET | Freq: Every day | ORAL | 3 refills | Status: DC
  Filled 2023-10-11: qty 90, 90d supply, fill #0

## 2023-10-11 NOTE — Patient Instructions (Signed)
 Medication Instructions:   Your physician recommends that you continue on your current medications as directed. Please refer to the Current Medication list given to you today.   *If you need a refill on your cardiac medications before your next appointment, please call your pharmacy*   Lab Work:  Your physician recommends that you return for a FASTING lipid profile and cmet,fasting after midnight.  Please refer to list below of any [facility].  Patient given paperwork today.         If you have labs (blood work) drawn today and your tests are completely normal, you will receive your results only by: MyChart Message (if you have MyChart) OR A paper copy in the mail If you have any lab test that is abnormal or we need to change your treatment, we will call you to review the results.   Testing/Procedures:  None ordered.   Follow-Up: At [facility], you and your health needs are our priority.  As part of our continuing mission to provide you with exceptional heart care, we have created designated Provider Care Teams.  These Care Teams include your primary Cardiologist (physician) and Advanced Practice Providers (APPs -  Physician Assistants and Nurse Practitioners) who all work together to provide you with the care you need, when you need it.  We recommend signing up for the patient portal called "MyChart".  Sign up information is provided on this After Visit Summary.  MyChart is used to connect with patients for Virtual Visits (Telemedicine).  Patients are able to view lab/test results, encounter notes, upcoming appointments, etc.  Non-urgent messages can be sent to your provider as well.   To learn more about what you can do with MyChart, go to [email or web address].    Your next appointment:   6 month(s)  Provider:   Donato Schultz, MD     Other Instructions  Your physician wants you to follow-up in: 6 months.  You will receive a reminder letter in the mail two months  in advance. If you don't receive a letter, please call our office to schedule the follow-up appointment.    1st Floor: - Lobby - Registration  - Pharmacy  - Lab - Cafe  2nd Floor: - PV Lab - Diagnostic Testing (echo, CT, nuclear med)  3rd Floor: - Vacant  4th Floor: - TCTS (cardiothoracic surgery) - AFib Clinic - Structural Heart Clinic - Vascular Surgery  - Vascular Ultrasound  5th Floor: - HeartCare Cardiology (general and EP) - Clinical Pharmacy for coumadin, hypertension, lipid, weight-loss medications, and med management appointments    Valet parking services will be available as well.

## 2023-10-12 ENCOUNTER — Other Ambulatory Visit: Payer: Self-pay

## 2023-10-19 ENCOUNTER — Ambulatory Visit: Payer: Medicaid Other | Admitting: Podiatry

## 2023-10-26 ENCOUNTER — Ambulatory Visit: Admitting: Podiatry

## 2023-10-26 ENCOUNTER — Encounter: Payer: Self-pay | Admitting: Podiatry

## 2023-10-26 DIAGNOSIS — S90222A Contusion of left lesser toe(s) with damage to nail, initial encounter: Secondary | ICD-10-CM

## 2023-10-26 DIAGNOSIS — I739 Peripheral vascular disease, unspecified: Secondary | ICD-10-CM

## 2023-10-26 NOTE — Progress Notes (Signed)
  Subjective:  Patient ID: Brooke [name], female    DOB: 1960/11/04,  MRN: [id number]  Chief Complaint  Patient presents with   Foot Pain    "I'm here for him to check my big toe after having the balloon procedure done in November to open up the artery."    63 y.o. female presents with the above complaint. History confirmed with patient.  She is doing well reports no new wounds the old wound has healed completely she does note a bruise on the second toenail  Objective:  Physical Exam: warm, good capillary refill, no trophic changes or ulcerative lesions, nonpalpable DP and PT pulses, normal sensory exam, and nail nail avulsion has good regrowth she has a small stable chronic subungual hematoma on the left second toenail no active ulceration    Assessment:   1. Subungual hematoma of second toe of left foot, initial encounter   2. PAD (peripheral artery disease) (HCC)        Plan:  Patient was evaluated and treated and all questions answered.  Doing quite well.  No ulceration or skin breakdown.  I debrided the nail back with a subungual hematoma and there is no infection or need for avulsion today.  Should grow out uneventfully.  Discussed importance of blood sugar control walking program for exercise and to promote collateralization and discussed the importance of checking her feet daily.  If she ever has any new issues or ulceration develop she will see me ASAP. No follow-ups on file.

## 2023-11-10 ENCOUNTER — Other Ambulatory Visit: Payer: Self-pay | Admitting: Family Medicine

## 2023-11-10 DIAGNOSIS — Z1231 Encounter for screening mammogram for malignant neoplasm of breast: Secondary | ICD-10-CM

## 2023-11-17 ENCOUNTER — Other Ambulatory Visit: Payer: Self-pay

## 2023-11-17 ENCOUNTER — Other Ambulatory Visit: Payer: Self-pay | Admitting: Family Medicine

## 2023-11-17 DIAGNOSIS — E1165 Type 2 diabetes mellitus with hyperglycemia: Secondary | ICD-10-CM

## 2023-11-17 MED ORDER — ATORVASTATIN CALCIUM 80 MG PO TABS
80.0000 mg | ORAL_TABLET | Freq: Every day | ORAL | 0 refills | Status: DC
  Filled 2023-11-17: qty 30, 30d supply, fill #0

## 2023-11-18 ENCOUNTER — Ambulatory Visit

## 2023-11-18 ENCOUNTER — Other Ambulatory Visit: Payer: Self-pay

## 2023-11-18 ENCOUNTER — Ambulatory Visit
Admission: RE | Admit: 2023-11-18 | Discharge: 2023-11-18 | Disposition: A | Discharge status: Home / Self Care | Source: Ambulatory Visit | Attending: Family Medicine | Admitting: Family Medicine

## 2023-11-18 DIAGNOSIS — Z1231 Encounter for screening mammogram for malignant neoplasm of breast: Secondary | ICD-10-CM

## 2023-11-22 ENCOUNTER — Encounter: Payer: Self-pay | Admitting: Internal Medicine

## 2023-11-25 ENCOUNTER — Other Ambulatory Visit: Payer: Self-pay

## 2023-11-25 ENCOUNTER — Encounter: Payer: Self-pay | Admitting: Podiatry

## 2023-11-25 ENCOUNTER — Ambulatory Visit: Admitting: Podiatry

## 2023-11-25 VITALS — Ht 64.5 in | Wt 106.0 lb

## 2023-11-25 DIAGNOSIS — L97512 Non-pressure chronic ulcer of other part of right foot with fat layer exposed: Secondary | ICD-10-CM | POA: Diagnosis not present

## 2023-11-25 DIAGNOSIS — B351 Tinea unguium: Secondary | ICD-10-CM | POA: Diagnosis not present

## 2023-11-25 DIAGNOSIS — E08621 Diabetes mellitus due to underlying condition with foot ulcer: Secondary | ICD-10-CM

## 2023-11-25 MED ORDER — MUPIROCIN 2 % EX OINT
1.0000 | TOPICAL_OINTMENT | Freq: Two times a day (BID) | CUTANEOUS | 2 refills | Status: DC
  Filled 2023-11-25: qty 22, 20d supply, fill #0

## 2023-11-25 MED ORDER — TERBINAFINE HCL 250 MG PO TABS
250.0000 mg | ORAL_TABLET | Freq: Every day | ORAL | 0 refills | Status: AC
Start: 2023-11-25 — End: 2024-02-23
  Filled 2023-11-25: qty 30, 30d supply, fill #0

## 2023-11-25 NOTE — Progress Notes (Signed)
  Subjective:  Patient ID: Brooke [name], female    DOB: 1960-08-22,  MRN: [id number]  Chief Complaint  Patient presents with   Toe Pain    Pt is here due to right pinky toe looks there is a scab on it been there for 2 weeks and Great toenail is hard and thick and as she states looks weird.    63 y.o. female presents with the above complaint. History confirmed with patient.   Objective:  Physical Exam: warm, good capillary refill, onychomycosis, and diffuse peripheral neuropathy nonpalpable pulses foot is fairly warm.  Full-thickness ulceration lateral fifth toe with exposed subcutaneous tissue approximately 6 mm x 4 mm x 2 mm.  Fibrogranular wound bed.  Surrounding hyperkeratosis..     Assessment:   1. Onychomycosis   2. Diabetic ulcer of toe of right foot associated with diabetes mellitus due to underlying condition, with fat layer exposed (HCC)      Plan:  Patient was evaluated and treated and all questions answered.  Onychomycosis -Educated on etiology of nail fungus. -Discussed oral topical and laser therapy and risks and benefits of each -eRx for oral terbinafine #90. Educated on risks and benefits of the medication. - Last FTs were normal and she has new labs ordered by her cardiologist soon   She is a new ulceration on the lateral fifth toe.  I debrided the lesion of nonviable subcutaneous and hyperkeratotic tissue as well as fibrin and slough to the subcutaneous level.  This was done sharply in an excisional manner.  Postoperative measurements are noted above.  Follow-up in 1 month for reevaluation, Rx for mupirocin sent to pharmacy for daily wound care.   Return in about 1 month (around 12/25/2023) for wound care.

## 2023-12-16 DIAGNOSIS — M5416 Radiculopathy, lumbar region: Secondary | ICD-10-CM | POA: Diagnosis not present

## 2023-12-26 ENCOUNTER — Emergency Department (HOSPITAL_COMMUNITY)

## 2023-12-26 ENCOUNTER — Emergency Department (HOSPITAL_COMMUNITY)
Admission: EM | Admit: 2023-12-26 | Discharge: 2023-12-26 | Disposition: A | Discharge status: Home / Self Care | Attending: Emergency Medicine | Admitting: Emergency Medicine

## 2023-12-26 ENCOUNTER — Encounter (HOSPITAL_COMMUNITY): Payer: Self-pay

## 2023-12-26 ENCOUNTER — Other Ambulatory Visit: Payer: Self-pay

## 2023-12-26 DIAGNOSIS — N3001 Acute cystitis with hematuria: Secondary | ICD-10-CM | POA: Diagnosis not present

## 2023-12-26 DIAGNOSIS — Z79899 Other long term (current) drug therapy: Secondary | ICD-10-CM | POA: Diagnosis not present

## 2023-12-26 DIAGNOSIS — E119 Type 2 diabetes mellitus without complications: Secondary | ICD-10-CM | POA: Insufficient documentation

## 2023-12-26 DIAGNOSIS — I509 Heart failure, unspecified: Secondary | ICD-10-CM | POA: Insufficient documentation

## 2023-12-26 DIAGNOSIS — Z7982 Long term (current) use of aspirin: Secondary | ICD-10-CM | POA: Diagnosis not present

## 2023-12-26 DIAGNOSIS — I110 Hypertensive heart disease with heart failure: Secondary | ICD-10-CM | POA: Diagnosis not present

## 2023-12-26 DIAGNOSIS — Z7901 Long term (current) use of anticoagulants: Secondary | ICD-10-CM | POA: Diagnosis not present

## 2023-12-26 DIAGNOSIS — R079 Chest pain, unspecified: Secondary | ICD-10-CM

## 2023-12-26 DIAGNOSIS — Z0389 Encounter for observation for other suspected diseases and conditions ruled out: Secondary | ICD-10-CM | POA: Diagnosis not present

## 2023-12-26 DIAGNOSIS — R0789 Other chest pain: Secondary | ICD-10-CM | POA: Diagnosis not present

## 2023-12-26 DIAGNOSIS — E871 Hypo-osmolality and hyponatremia: Secondary | ICD-10-CM | POA: Diagnosis not present

## 2023-12-26 DIAGNOSIS — Z794 Long term (current) use of insulin: Secondary | ICD-10-CM | POA: Insufficient documentation

## 2023-12-26 LAB — CBC WITH DIFFERENTIAL/PLATELET
Abs Immature Granulocytes: 0.06 10*3/uL (ref 0.00–0.07)
Basophils Absolute: 0 10*3/uL (ref 0.0–0.1)
Basophils Relative: 0 %
Eosinophils Absolute: 0.1 10*3/uL (ref 0.0–0.5)
Eosinophils Relative: 1 %
HCT: 32.9 % — ABNORMAL LOW (ref 36.0–46.0)
Hemoglobin: 10.9 g/dL — ABNORMAL LOW (ref 12.0–15.0)
Immature Granulocytes: 1 %
Lymphocytes Relative: 24 %
Lymphs Abs: 2.4 10*3/uL (ref 0.7–4.0)
MCH: 26.6 pg (ref 26.0–34.0)
MCHC: 33.1 g/dL (ref 30.0–36.0)
MCV: 80.2 fL (ref 80.0–100.0)
Monocytes Absolute: 0.9 10*3/uL (ref 0.1–1.0)
Monocytes Relative: 9 %
Neutro Abs: 6.2 10*3/uL (ref 1.7–7.7)
Neutrophils Relative %: 65 %
Platelets: 449 10*3/uL — ABNORMAL HIGH (ref 150–400)
RBC: 4.1 MIL/uL (ref 3.87–5.11)
RDW: 12.8 % (ref 11.5–15.5)
WBC: 9.7 10*3/uL (ref 4.0–10.5)
nRBC: 0 % (ref 0.0–0.2)

## 2023-12-26 LAB — COMPREHENSIVE METABOLIC PANEL WITH GFR
ALT: 16 U/L (ref 0–44)
AST: 19 U/L (ref 15–41)
Albumin: 2.7 g/dL — ABNORMAL LOW (ref 3.5–5.0)
Alkaline Phosphatase: 98 U/L (ref 38–126)
Anion gap: 15 (ref 5–15)
BUN: 32 mg/dL — ABNORMAL HIGH (ref 8–23)
CO2: 23 mmol/L (ref 22–32)
Calcium: 9.3 mg/dL (ref 8.9–10.3)
Chloride: 96 mmol/L — ABNORMAL LOW (ref 98–111)
Creatinine, Ser: 1.32 mg/dL — ABNORMAL HIGH (ref 0.44–1.00)
GFR, Estimated: 46 mL/min — ABNORMAL LOW (ref >60)
Glucose, Bld: 244 mg/dL — ABNORMAL HIGH (ref 70–99)
Potassium: 3.9 mmol/L (ref 3.5–5.1)
Sodium: 134 mmol/L — ABNORMAL LOW (ref 135–145)
Total Bilirubin: 0.6 mg/dL (ref 0.0–1.2)
Total Protein: 8.4 g/dL — ABNORMAL HIGH (ref 6.5–8.1)

## 2023-12-26 LAB — URINALYSIS, ROUTINE W REFLEX MICROSCOPIC
Bilirubin Urine: NEGATIVE
Glucose, UA: 150 mg/dL — AB
Ketones, ur: NEGATIVE mg/dL
Nitrite: NEGATIVE
Protein, ur: 100 mg/dL — AB
Specific Gravity, Urine: 1.009 (ref 1.005–1.030)
WBC, UA: >50 WBC/hpf (ref 0–5)
pH: 5 (ref 5.0–8.0)

## 2023-12-26 LAB — TROPONIN I (HIGH SENSITIVITY)
Troponin I (High Sensitivity): 18 ng/L — ABNORMAL HIGH (ref <18)
Troponin I (High Sensitivity): 10 ng/L (ref <18)

## 2023-12-26 LAB — BRAIN NATRIURETIC PEPTIDE: B Natriuretic Peptide: 119.1 pg/mL — ABNORMAL HIGH (ref 0.0–100.0)

## 2023-12-26 LAB — LIPASE, BLOOD: Lipase: 34 U/L (ref 11–51)

## 2023-12-26 MED ORDER — CEPHALEXIN 500 MG PO CAPS
500.0000 mg | ORAL_CAPSULE | Freq: Two times a day (BID) | ORAL | 0 refills | Status: DC
Start: 2023-12-26 — End: 2023-12-26
  Filled 2023-12-26: qty 10, 5d supply, fill #0

## 2023-12-26 MED ORDER — LIDOCAINE 5 % EX PTCH
1.0000 | MEDICATED_PATCH | CUTANEOUS | Status: DC
  Administered 2023-12-26: 1 via TRANSDERMAL
  Filled 2023-12-26: qty 1

## 2023-12-26 MED ORDER — ALUM & MAG HYDROXIDE-SIMETH 200-200-20 MG/5ML PO SUSP
30.0000 mL | Freq: Once | ORAL | Status: AC
  Administered 2023-12-26: 30 mL via ORAL
  Filled 2023-12-26: qty 30

## 2023-12-26 MED ORDER — IOHEXOL 350 MG/ML SOLN
75.0000 mL | Freq: Once | INTRAVENOUS | Status: AC | PRN
  Administered 2023-12-26: 75 mL via INTRAVENOUS

## 2023-12-26 MED ORDER — CEPHALEXIN 250 MG PO CAPS
500.0000 mg | ORAL_CAPSULE | Freq: Once | ORAL | Status: AC
Start: 2023-12-26 — End: 2023-12-26
  Administered 2023-12-26: 500 mg via ORAL
  Filled 2023-12-26: qty 2

## 2023-12-26 MED ORDER — LIDOCAINE VISCOUS HCL 2 % MT SOLN
15.0000 mL | Freq: Once | OROMUCOSAL | Status: AC
  Administered 2023-12-26: 15 mL via ORAL
  Filled 2023-12-26: qty 15

## 2023-12-26 MED ORDER — CEPHALEXIN 500 MG PO CAPS: 500.0000 mg | ORAL_CAPSULE | Freq: Two times a day (BID) | ORAL | 0 refills | Status: AC

## 2023-12-26 NOTE — ED Provider Notes (Signed)
 [place] EMERGENCY DEPARTMENT AT [facility] Provider Note   CSN: [id number] Arrival date & time: 12/26/23  1322     History  Chief Complaint  Patient presents with   Chest Pain   Abdominal Pain    Keviana Guida is a 63 y.o. female.   Chest Pain Associated symptoms: abdominal pain   Abdominal Pain Associated symptoms: chest pain and dysuria      63 year old female with medical history significant for diabetes mellitus, HTN, depression, HLD, anxiety, CHF ( last EF April 29 2354 to 60% with diastolic dysfunction) presenting to the emergency department with multiple complaints.  Primarily, the patient endorses left-sided chest wall pain with some radiation to her left shoulder.  She denies any nausea or vomiting.  She describes it as a sharp stabbing pain.  She has had some good tears to oral intake over the past few days.  Has been worked up outpatient for some difficulty swallowing.  She follows with Dr. General Kenner of [facility] gastroenterology and had an EGD in March 2024 which showed no stenosis or stricture.  She was empirically dilated.  She has had chronic dysphagia to both solids and liquids localizing to her mid chest.  She had a barium swallow in October 23 showing moderate esophageal dysmotility.  She had an abnormal manometry with fragmented peristalsis but no abnormal contractions.  Additionally, the patient thinks that she has symptoms of a UTI pain while urinating and change in odor.  Home Medications Prior to Admission medications   Medication Sig Start Date End Date Taking? Authorizing Provider  aspirin  81 MG chewable tablet Chew 1 tablet (81 mg total) by mouth daily. 12/15/21   Bensimhon, Rheta Celestine, MD  atorvastatin  (LIPITOR ) 80 MG tablet Take 1 tablet (80 mg total) by mouth daily. Please schedule PCP appointment. 11/17/23   Newlin, Enobong, MD  cephALEXin  (KEFLEX ) 500 MG capsule Take 1 capsule (500 mg total) by mouth 2 (two) times daily for 5 days. 12/26/23  12/31/23  Rosealee Concha, MD  clopidogrel  (PLAVIX ) 75 MG tablet Take 1 tablet (75 mg total) by mouth daily. 02/23/23   Newlin, Enobong, MD  conjugated estrogens  (PREMARIN ) vaginal cream Insert 0.5g vaginally nightly for two weeks, then twice a week after 12/31/22   Zuleta, Kaitlin G, NP  Continuous Blood Gluc Receiver (DEXCOM G7 RECEIVER) DEVI Use to check blood sugar 3 times daily. 06/02/22   Newlin, Enobong, MD  Continuous Glucose Sensor (DEXCOM G7 SENSOR) MISC Use to check blood sugar 3 times daily. 12/11/22   Newlin, Enobong, MD  dapagliflozin  propanediol (FARXIGA ) 10 MG TABS tablet Take 1 tablet (10 mg total) by mouth daily. 04/15/23   Newlin, Enobong, MD  diazepam  (VALIUM ) 5 MG tablet Insert 1 tablet vaginally nightly as needed for muscle spasm/ pelvic pain. 12/30/22   Zuleta, Kaitlin G, NP  Insulin  Lispro Prot & Lispro (HUMALOG  MIX 75/25 KWIKPEN) (75-25) 100 UNIT/ML Kwikpen Inject 32 Units into the skin in the morning AND 16 Units every evening. 02/23/23   Newlin, Enobong, MD  Insulin  Pen Needle (PENTIPS) 32G X 4 MM MISC Use as directed with insulin  pen 11/17/22   Newlin, Enobong, MD  loperamide  (IMODIUM  A-D) 2 MG tablet Take 1 tablet (2 mg total) by mouth as needed for diarrhea or loose stools. 02/09/23   Armbruster, Lendon Queen, MD  metoprolol  succinate (TOPROL -XL) 25 MG 24 hr tablet Take 1 tablet (25 mg total) by mouth daily. Take with or immediately following a meal. 10/11/23   Swinyer, Leilani Punter,  NP  Multiple Vitamin (MULTIVITAMIN WITH MINERALS) TABS tablet Take 1 tablet by mouth daily.    [provider]  mupirocin  ointment (BACTROBAN ) 2 % Apply 1 Application topically 2 (two) times daily. 11/25/23   McDonald, Olive Better, DPM  pantoprazole  (PROTONIX ) 20 MG tablet Take 1 tablet (20 mg total) by mouth daily. 08/17/23   Armbruster, Lendon Queen, MD  sacubitril -valsartan  (ENTRESTO ) 49-51 MG Take 1 tablet by mouth 2 (two) times daily. 05/06/23   Newlin, Enobong, MD  solifenacin  (VESICARE ) 10 MG tablet Take 1  tablet (10 mg total) by mouth daily. 07/05/23   Zuleta, Kaitlin G, NP  spironolactone  (ALDACTONE ) 25 MG tablet Take 1 tablet (25 mg total) by mouth daily. 11/17/22   Newlin, Enobong, MD  terbinafine  (LAMISIL ) 250 MG tablet Take 1 tablet (250 mg total) by mouth daily. 11/25/23 02/23/24  Floyce Hutching, DPM  venlafaxine  XR (EFFEXOR  XR) 75 MG 24 hr capsule Take 1 capsule (75 mg total) by mouth daily with breakfast. For headaches 02/23/23   Newlin, Enobong, MD  vitamin B-12 (CYANOCOBALAMIN) 1000 MCG tablet Take 3,000 mcg by mouth daily.    [provider]      Allergies    Aspirin , Darvocet [propoxyphene n-acetaminophen ], Erythromycin, Gabapentin, Ibuprofen, and Metformin  and related    Review of Systems   Review of Systems  Cardiovascular:  Positive for chest pain.  Gastrointestinal:  Positive for abdominal pain.  Genitourinary:  Positive for dysuria.  All other systems reviewed and are negative.   Physical Exam Updated Vital Signs BP 122/73   Pulse (!) 102   Temp 98.3 F (36.8 C) (Oral)   Resp 14   Ht 5\' 4"  (1.626 m)   Wt 68 kg   LMP 11/16/2014 (Approximate)   SpO2 100%   BMI 25.75 kg/m  Physical Exam Vitals and nursing note reviewed.  Constitutional:      General: She is not in acute distress.    Appearance: She is well-developed.  HENT:     Head: Normocephalic and atraumatic.  Eyes:     Conjunctiva/sclera: Conjunctivae normal.  Cardiovascular:     Rate and Rhythm: Normal rate and regular rhythm.     Heart sounds: No murmur heard. Pulmonary:     Effort: Pulmonary effort is normal. No respiratory distress.     Breath sounds: Normal breath sounds.  Chest:     Comments: Left-sided chest wall tenderness that reproduces the patient's pain Abdominal:     Palpations: Abdomen is soft.     Tenderness: There is no abdominal tenderness.  Musculoskeletal:        General: No swelling.     Cervical back: Neck supple.  Skin:    General: Skin is warm and dry.      Capillary Refill: Capillary refill takes less than 2 seconds.  Neurological:     Mental Status: She is alert.  Psychiatric:        Mood and Affect: Mood normal.     ED Results / Procedures / Treatments   Labs (all labs ordered are listed, but only abnormal results are displayed) Labs Reviewed  CBC WITH DIFFERENTIAL/PLATELET - Abnormal; Notable for the following components:      Result Value   Hemoglobin 10.9 (*)    HCT 32.9 (*)    Platelets 449 (*)    All other components within normal limits  COMPREHENSIVE METABOLIC PANEL WITH GFR - Abnormal; Notable for the following components:   Sodium 134 (*)    Chloride 96 (*)  Glucose, Bld 244 (*)    BUN 32 (*)    Creatinine, Ser 1.32 (*)    Total Protein 8.4 (*)    Albumin 2.7 (*)    GFR, Estimated 46 (*)    All other components within normal limits  URINALYSIS, ROUTINE W REFLEX MICROSCOPIC - Abnormal; Notable for the following components:   APPearance TURBID (*)    Glucose, UA 150 (*)    Hgb urine dipstick MODERATE (*)    Protein, ur 100 (*)    Leukocytes,Ua LARGE (*)    Bacteria, UA MANY (*)    All other components within normal limits  BRAIN NATRIURETIC PEPTIDE - Abnormal; Notable for the following components:   B Natriuretic Peptide 119.1 (*)    All other components within normal limits  TROPONIN I (HIGH SENSITIVITY) - Abnormal; Notable for the following components:   Troponin I (High Sensitivity) 18 (*)    All other components within normal limits  LIPASE, BLOOD  TROPONIN I (HIGH SENSITIVITY)    EKG EKG Interpretation Date/Time:  Sunday Dec 26 2023 13:36:31 EDT Ventricular Rate:  95 PR Interval:  150 QRS Duration:  86 QT Interval:  374 QTC Calculation: 469 R Axis:   86  Text Interpretation: Normal sinus rhythm Right atrial enlargement ST & T wave abnormality, consider inferior ischemia Abnormal ECG When compared with ECG of 11-Oct-2023 16:18, PREVIOUS ECG IS PRESENT Confirmed by Rosealee Concha ([id number]) on 12/26/2023  2:56:32 PM  Radiology CT Angio Chest PE W and/or Wo Contrast Result Date: 12/26/2023 CLINICAL DATA:  High probability for PE EXAM: CT ANGIOGRAPHY CHEST WITH CONTRAST TECHNIQUE: Multidetector CT imaging of the chest was performed using the standard protocol during bolus administration of intravenous contrast. Multiplanar CT image reconstructions and MIPs were obtained to evaluate the vascular anatomy. RADIATION DOSE REDUCTION: This exam was performed according to the departmental dose-optimization program which includes automated exposure control, adjustment of the mA and/or kV according to patient size and/or use of iterative reconstruction technique. CONTRAST:  75mL OMNIPAQUE  IOHEXOL  350 MG/ML SOLN COMPARISON:  Chest CT 10/08/2020 FINDINGS: Cardiovascular: Satisfactory opacification of the pulmonary arteries to the segmental level. No evidence of pulmonary embolism. Normal heart size. No pericardial effusion. Mediastinum/Nodes: No enlarged mediastinal, hilar, or axillary lymph nodes. Thyroid gland, trachea, and esophagus demonstrate no significant findings. Lungs/Pleura: Lungs are clear. No pleural effusion or pneumothorax. Upper Abdomen: No acute abnormality. Musculoskeletal: No chest wall abnormality. No acute or significant osseous findings. Review of the MIP images confirms the above findings. IMPRESSION: No evidence for pulmonary embolism or other acute intrathoracic process. Electronically Signed   By: Tyron Gallon M.D.   On: 12/26/2023 17:20   DG Chest 1 View Result Date: 12/26/2023 CLINICAL DATA:  Pain EXAM: CHEST  1 VIEW COMPARISON:  None Available. FINDINGS: The heart size and mediastinal contours are within normal limits. Both lungs are clear. The visualized skeletal structures are unremarkable. IMPRESSION: No active disease. Electronically Signed   By: Tyron Gallon M.D.   On: 12/26/2023 15:18    Procedures Procedures    Medications Ordered in ED Medications  lidocaine  (LIDODERM ) 5 % 1  patch (1 patch Transdermal Patch Applied 12/26/23 1859)  iohexol  (OMNIPAQUE ) 350 MG/ML injection 75 mL (75 mLs Intravenous Contrast Given 12/26/23 1704)  cephALEXin  (KEFLEX ) capsule 500 mg (500 mg Oral Given 12/26/23 1750)  alum & mag hydroxide-simeth (MAALOX/MYLANTA) 200-200-20 MG/5ML suspension 30 mL (30 mLs Oral Given 12/26/23 1750)    And  lidocaine  (XYLOCAINE ) 2 % viscous mouth solution  15 mL (15 mLs Oral Given 12/26/23 1750)    ED Course/ Medical Decision Making/ A&P             HEART Score: 6                    Medical Decision Making Amount and/or Complexity of Data Reviewed Labs: ordered. Radiology: ordered.  Risk OTC drugs. Prescription drug management.    63 year old female with medical history significant for diabetes mellitus, HTN, depression, HLD, anxiety, CHF ( last EF April 29 2354 to 60% with diastolic dysfunction) presenting to the emergency department with multiple complaints.  Primarily, the patient endorses left-sided chest wall pain with some radiation to her left shoulder.  She denies any nausea or vomiting.  She describes it as a sharp stabbing pain.  She has had some good tears to oral intake over the past few days.  Has been worked up outpatient for some difficulty swallowing.  She follows with Dr. General Kenner of [facility] gastroenterology and had an EGD in March 2024 which showed no stenosis or stricture.  She was empirically dilated.  She has had chronic dysphagia to both solids and liquids localizing to her mid chest.  She had a barium swallow in October 23 showing moderate esophageal dysmotility.  She had an abnormal manometry with fragmented peristalsis but no abnormal contractions.  Additionally, the patient thinks that she has symptoms of a UTI pain while urinating and change in odor.  On arrival, the patient was afebrile, not tachycardic or tachypneic, soft blood pressures BP 98/70, saturating 90% on room air.  Physical exam revealed left-sided chest wall  tenderness to palpation, lungs CTAB.  Considered PE, considered esophageal spasm, considered ACS, GERD, musculoskeletal chest pain.  Considered UTI in the setting of the patient's symptoms.  EKG: Sinus rhythm, ventricular rate 95, nonspecific ST changes.  Chest x-ray revealed no active disease.  Laboratory evaluation revealed lipase normal, CBC without a leukocytosis, mild anemia to 10.9, cardiac troponin initially elevated at 19, repeat downtrending to 10.  Very low concern for ACS in the setting of this.  CMP revealed hyperglycemia to 244, no anion gap acidosis with a bicarbonate of 23, anion gap of 15, creatinine at baseline at 1.32, urinalysis grossly positive for UTI with large leukocytes, greater than 50 WBCs and many bacteria present.  Patient was administered Keflex , Maalox and lidocaine  as well as a lidocaine  patch along her left chest wall.  A BNP was elevated 119 very mildly.  CTA PE study: Negative for PE other acute intrathoracic abnormality.  Following the above interventions, the patient was feeling symptomatically improved.  Suspect component of chest wall pain versus esophageal spasm or reflux type presentation.  Overall reassuring workup in the emergency department at this time.  Recommended discharge on Keflex  for treatment of UTI, outpatient follow-up with GI given her esophageal dysmotility issues, outpatient follow-up for cardiac workup also recommended given elevated HEART score.  Final Clinical Impression(s) / ED Diagnoses Final diagnoses:  Chest wall pain  Chest pain, unspecified type  Acute cystitis with hematuria    Rx / DC Orders ED Discharge Orders          Ordered    cephALEXin  (KEFLEX ) 500 MG capsule  2 times daily,   Status:  Discontinued        12/26/23 1900    cephALEXin  (KEFLEX ) 500 MG capsule  2 times daily        12/26/23 1900  Rosealee Concha, MD 12/26/23 1900

## 2023-12-26 NOTE — ED Notes (Signed)
 Pt d/c home with visitor per EDP order. Discharge summary reviewed, verbalize understanding. NAD

## 2023-12-26 NOTE — ED Provider Triage Note (Signed)
 Emergency Medicine Provider Triage Evaluation Note  Saliha Salts , a 63 y.o. female  was evaluated in triage.  Pt complains of chest pain and abdominal pain.  Patient reports she has been unable to eat since last Sunday.  Review of Systems  Positive: Nausea, chest pain, upper abdominal pain Negative: Fever  Physical Exam  BP 98/70   Pulse 94   Temp 98.4 F (36.9 C)   Resp 18   Ht 5\' 4"  (1.626 m)   Wt 68 kg   LMP 11/16/2014 (Approximate)   SpO2 98%   BMI 25.75 kg/m  Gen:   Awake, no distress   Resp:  Normal effort  MSK:   Moves extremities without difficulty  Other:    Medical Decision Making  Medically screening exam initiated at 2:15 PM.  Appropriate orders placed.  Merle Whitehorn was informed that the remainder of the evaluation will be completed by another provider, this initial triage assessment does not replace that evaluation, and the importance of remaining in the ED until their evaluation is complete.     Sandi Crosby, PA-C 12/26/23 1416

## 2023-12-26 NOTE — Discharge Instructions (Addendum)
 Your EKG, CT imaging, laboratory workup was overall reassuring.  You did have evidence of urinary tract infection for which we will treat with a course of antibiotics.  Follow-up with your cardiologist for further management in addition, follow-up with your gastroenterologist given your persistent swallowing issues.

## 2023-12-26 NOTE — ED Notes (Signed)
 Pt to CT

## 2023-12-26 NOTE — ED Triage Notes (Signed)
 Pt came in via POV d/t Lt/central CP the 6 days with some radiation into her Lt shoulder. Denies n/v, endorses a sharp/stabbing abd pain in the ULQ today as well. Has not been eating much the last 5 days, states she has only been drinking some water & taking a few bites every now & then. A/Ox4, rates her pain 10/10.

## 2023-12-27 ENCOUNTER — Telehealth: Payer: Self-pay | Admitting: Gastroenterology

## 2023-12-27 ENCOUNTER — Other Ambulatory Visit: Payer: Self-pay

## 2023-12-27 ENCOUNTER — Ambulatory Visit: Admitting: Podiatry

## 2023-12-27 NOTE — Telephone Encounter (Signed)
 Patient states that she went to the emergency room yesterday with complains of abdominal pain and chest pain/early satiety. States they gave her antibiotics and told her to follow up with her GI doctor. Upon review of ED notes, it appears patient was given oral rx for keflex  for urinary tract infection. She says that she is irritated because they did not attempt to give her IV antibiotics but instead gave her oral antibiotics knowing she had not eaten and had nothing on her stomach. She also says that she has been having chest discomfort and has been unable to eat or drink her normal fluids due to anorexia. She says that when she does eat or drink, she is able to tolerate it. Patient denies use of a PPI, saying that the pantoprazole  previously recommended to her by Dr General Kenner was not tolerated and "made me mess all over myself." Says she takes mylanta sometimes now if she needs it. Patient wants additional recommendations from GI about what to do for her early satiety/chest discomfort.  We discussed the fact that a urinary tract infection can cause systemic response including anorexia. This, coupled with oral antibiotics can make it even more difficult for appropriate oral intake. I asked that she push fluids although she does not want them and advised that she remain on a bland diet, taking small well chewed bites (has fragmented peristalsis). I also recommended that if unable to eat foods, she may benefit from a temporary boost/ensure supplement to support nutritional needs. I asked that she take her Keflex  in its entirety as this should help her overall symptoms. She is advised she may take famotidine  OTC if needed for heartburn. She still requests follow up with GI for further recommendations. She is scheduled to see Everett Hitt on 01/18/24 should she still have early satiety/anorexia/chest discomfort.  Patient is reminded that should she develop intractable nausea/vomiting, inability to tolerate  PO fluids, severe chest pain, abdominal pain, fever, she should return to the emergency room for additional evaluation.

## 2023-12-27 NOTE — Telephone Encounter (Signed)
 PT is calling to discuss her ED visit. She is highly upset that they gave her two strong antibiotics and hadn't ate in 8 days. She is very concerned about her condition and would like to speak about it. Please advise.

## 2023-12-27 NOTE — Telephone Encounter (Signed)
 Thanks Dottie,  Agree with your recommendations.  If she did not like Protonix  we could try her on famotidine  20 mg daily to see if she will tolerate that better.  Alternatively she could try omeprazole  20 mg daily, different class of PPI.  Famotidine  is pretty well-tolerated if she would like to try that first.  Thank you.

## 2024-01-14 ENCOUNTER — Other Ambulatory Visit: Payer: Self-pay

## 2024-01-14 ENCOUNTER — Ambulatory Visit
Admission: EM | Admit: 2024-01-14 | Discharge: 2024-01-14 | Disposition: A | Discharge status: Home / Self Care | Attending: Family Medicine | Admitting: Family Medicine

## 2024-01-14 ENCOUNTER — Encounter: Payer: Self-pay | Admitting: *Deleted

## 2024-01-14 DIAGNOSIS — Z800 Family history of malignant neoplasm of digestive organs: Secondary | ICD-10-CM | POA: Insufficient documentation

## 2024-01-14 DIAGNOSIS — E119 Type 2 diabetes mellitus without complications: Secondary | ICD-10-CM | POA: Insufficient documentation

## 2024-01-14 DIAGNOSIS — N3090 Cystitis, unspecified without hematuria: Secondary | ICD-10-CM

## 2024-01-14 DIAGNOSIS — Z7901 Long term (current) use of anticoagulants: Secondary | ICD-10-CM | POA: Insufficient documentation

## 2024-01-14 DIAGNOSIS — G8929 Other chronic pain: Secondary | ICD-10-CM | POA: Insufficient documentation

## 2024-01-14 LAB — POCT URINALYSIS DIP (MANUAL ENTRY)
Bilirubin, UA: NEGATIVE
Glucose, UA: 500 mg/dL — AB
Ketones, POC UA: NEGATIVE mg/dL
Nitrite, UA: POSITIVE — AB
Protein Ur, POC: >=300 mg/dL — AB
Spec Grav, UA: 1.015 (ref 1.010–1.025)
Urobilinogen, UA: 0.2 U/dL
pH, UA: 5.5 (ref 5.0–8.0)

## 2024-01-14 MED ORDER — CIPROFLOXACIN HCL 250 MG PO TABS
250.0000 mg | ORAL_TABLET | Freq: Two times a day (BID) | ORAL | 0 refills | Status: AC
  Filled 2024-01-14: qty 14, 7d supply, fill #0

## 2024-01-14 MED ORDER — PHENAZOPYRIDINE HCL 100 MG PO TABS
100.0000 mg | ORAL_TABLET | Freq: Three times a day (TID) | ORAL | 0 refills | Status: DC | PRN
  Filled 2024-01-14: qty 10, 4d supply, fill #0

## 2024-01-14 MED ORDER — CEFDINIR 300 MG PO CAPS
600.0000 mg | ORAL_CAPSULE | Freq: Every day | ORAL | 0 refills | Status: DC
  Filled 2024-01-14: qty 14, 7d supply, fill #0

## 2024-01-14 NOTE — Discharge Instructions (Signed)
 The urinalysis was abnormal with white blood cells, red blood cells, and nitrites, consistent with a urinary infection.  Take Cipro  500 mg--1 tablet 2 times daily for 7 days  Take Pyridium/phenazopyridine 100 mg--1 tablet 3 times daily as needed for urinary pain.  This medication usually makes the urine orange

## 2024-01-14 NOTE — ED Provider Notes (Signed)
 [facility]    CSN: [id number] Arrival date & time: 01/14/24  1242      History   Chief Complaint Chief Complaint  Patient presents with   Urinary Tract Infection    HPI Brooke [name] is a 63 y.o. female.    Urinary Tract Infection  Here for continued dysuria, urinary frequency, and incomplete bladder emptying.  No fever or chills or nausea or vomiting.  This began about May 10 or so.  She was seen in the ER on May 18 and her urinalysis was very abnormal and consistent with a UTI.  That urine was not cultured.  She was prescribed Keflex  and she states today that she has not improved.  She does have a history of difficult to treat urinary tract infections.  She is allergic to several medicines but the only antibiotic is erythromycin which causes nausea and vomiting.  She has diabetes, and states that it is doing much better lately. Past Medical History:  Diagnosis Date   Abdominal pain, left lower quadrant 12/17/2015   Anxiety 07/01/2016   Carotid artery disease (HCC) 10/27/2022   Chest pain, unspecified 10/05/2003   CHF (congestive heart failure) (HCC)    Chronic systolic heart failure (HCC) 02/20/2022   Depression, major, recurrent, moderate (HCC) 07/31/2005   Diabetes mellitus without complication (HCC) 09/08/2006   Endometriosis 03/28/1996   History of colonic polyps 08/07/2015   20 mm removed 05/10/15 by Dr. 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   Carpal tunnel syndrome of right wrist  08/25/2022   Pain in right hand 08/25/2022   Weakness of right hand 08/25/2022   Protein calorie malnutrition (HCC) 04/07/2022   Chronic systolic heart failure (HCC) 02/20/2022   Pressure injury of skin 10/10/2021   Chronic pelvic pain in female 05/06/2020   Anxiety 07/01/2016   Wrist pain 06/24/2016   Back pain 06/03/2016   Abdominal pain, left lower quadrant 12/17/2015   History of colonic polyps 08/07/2015   Diverticulitis 05/28/2015   Seborrheic keratoses 01/30/2015   Hyperlipidemia 01/30/2015   Generalized anxiety disorder 07/04/2014   Diabetes mellitus without complication (HCC) 09/08/2006   Depression, major, recurrent, moderate (HCC) 07/31/2005   Hypertension 11/23/2003   Endometriosis 03/28/1996    Past Surgical History:  Procedure Laterality Date   ABDOMINAL AORTOGRAM W/LOWER EXTREMITY Left 07/29/2023   Procedure: ABDOMINAL AORTOGRAM W/LOWER EXTREMITY;  Surgeon: Young Hensen, MD;  Location: [facility];  Service: Cardiovascular;  Laterality: Left;   APPENDECTOMY  1996   Dr. Ephraim Hash   BREAST SURGERY Bilateral 1996   Dr. Lorna Rose   ESOPHAGEAL MANOMETRY N/A 03/23/2023   Procedure: ESOPHAGEAL MANOMETRY (EM);  Surgeon: Ace Holder, MD;  Location: [facility];  Service: Gastroenterology;  Laterality: N/A;   INGUINAL HERNIA REPAIR     OVARIAN CYST REMOVAL     PERIPHERAL VASCULAR BALLOON ANGIOPLASTY Left 07/29/2023   Procedure: PERIPHERAL VASCULAR BALLOON ANGIOPLASTY;  Surgeon: Young Hensen, MD;  Location: [facility];  Service: Cardiovascular;  Laterality: Left;  RIGHT/LEFT HEART CATH AND CORONARY ANGIOGRAPHY N/A 10/09/2021   Procedure: RIGHT/LEFT HEART CATH AND CORONARY ANGIOGRAPHY;  Surgeon: Kyra Phy, MD;  Location: [facility];  Service: Cardiovascular;  Laterality: N/A;   TOOTH EXTRACTION N/A 07/28/2022   Procedure: EXTRACTION ALL REMAING TEETH THREE, FOUR, FIVE, SIX, SEVEN, EIGHT, NINE, TEN, ELEVEN, TWELVE, TWENTY ONE,  TWENTY TWO, TWENTY THREE, TWENTY FOUR, TWENTY FIVE, TWENTY SIX, TWENTY SEVEN, TWENTY EIGHT, TWENTY NINE, THIRTY, AVEOLOPLASTY;  Surgeon: Ascencion Lava, DMD;  Location: [facility];  Service: Oral Surgery;  Laterality: N/A;    OB History     Gravida  1   Para      Term      Preterm      AB      Living  1      SAB      IAB      Ectopic      Multiple      Live Births  1            Home Medications    Prior to Admission medications   Medication Sig Start Date End Date Taking? Authorizing Provider  aspirin  81 MG chewable tablet Chew 1 tablet (81 mg total) by mouth daily. 12/15/21  Yes Bensimhon, Rheta Celestine, MD  atorvastatin  (LIPITOR ) 80 MG tablet Take 1 tablet (80 mg total) by mouth daily. Please schedule PCP appointment. 11/17/23  Yes Newlin, Enobong, MD  ciprofloxacin  (CIPRO ) 250 MG tablet Take 1 tablet (250 mg total) by mouth 2 (two) times daily for 7 days. 01/14/24 01/21/24 Yes Carah Barrientes, Paige Boatman, MD  clopidogrel  (PLAVIX ) 75 MG tablet Take 1 tablet (75 mg total) by mouth daily. 02/23/23  Yes Newlin, Enobong, MD  conjugated estrogens  (PREMARIN ) vaginal cream Insert 0.5g vaginally nightly for two weeks, then twice a week after 12/31/22  Yes Zuleta, Kaitlin G, NP  diazepam  (VALIUM ) 5 MG tablet Insert 1 tablet vaginally nightly as needed for muscle spasm/ pelvic pain. 12/30/22  Yes Zuleta, Kaitlin G, NP  glyBURIDE -metformin  (GLUCOVANCE ) 2.5-500 MG tablet 1 tablet with a meal Orally Twice a day for 30 day(s) 09/13/12  Yes [provider]  Insulin  Lispro Prot & Lispro (HUMALOG  MIX 75/25 KWIKPEN) (75-25) 100 UNIT/ML Kwikpen Inject 32 Units into the skin in the morning AND 16 Units every evening. 02/23/23  Yes Newlin, Enobong, MD  Insulin  Pen Needle (PENTIPS) 32G X 4 MM MISC Use as directed with insulin  pen 11/17/22  Yes Newlin, Enobong, MD  loperamide  (IMODIUM  A-D) 2 MG tablet Take 1 tablet (2 mg total) by mouth as needed for diarrhea or loose stools. 02/09/23  Yes Armbruster, Lendon Queen, MD   metoprolol  succinate (TOPROL -XL) 25 MG 24 hr tablet Take 1 tablet (25 mg total) by mouth daily. Take with or immediately following a meal. 10/11/23  Yes Swinyer, Leilani Punter, NP  Multiple Vitamin (MULTIVITAMIN WITH MINERALS) TABS tablet Take 1 tablet by mouth daily.   Yes [provider]  pantoprazole  (PROTONIX ) 20 MG tablet Take 1 tablet (20 mg total) by mouth daily. 08/17/23  Yes Armbruster, Lendon Queen, MD  phenazopyridine (PYRIDIUM) 100 MG tablet Take 1 tablet (100 mg total) by mouth 3 (three) times daily as needed (urinary pain). 01/14/24  Yes Malinda Mayden K, MD  phenazopyridine (PYRIDIUM) 100 MG tablet Take 1 tablet (100 mg total) by mouth 3 (three) times daily as needed (urinary pain). 01/14/24  Yes Ann Keto, MD  sacubitril -valsartan  (ENTRESTO ) 49-51 MG Take 1 tablet by mouth 2 (two)  times daily. 05/06/23  Yes Newlin, Enobong, MD  solifenacin  (VESICARE ) 10 MG tablet Take 1 tablet (10 mg total) by mouth daily. 07/05/23  Yes Zuleta, Kaitlin G, NP  spironolactone  (ALDACTONE ) 25 MG tablet Take 1 tablet (25 mg total) by mouth daily. 11/17/22  Yes Newlin, Enobong, MD  terbinafine  (LAMISIL ) 250 MG tablet Take 1 tablet (250 mg total) by mouth daily. 11/25/23 02/23/24 Yes McDonald, Olive Better, DPM  venlafaxine  XR (EFFEXOR  XR) 75 MG 24 hr capsule Take 1 capsule (75 mg total) by mouth daily with breakfast. For headaches 02/23/23  Yes Newlin, Enobong, MD  vitamin B-12 (CYANOCOBALAMIN) 1000 MCG tablet Take 3,000 mcg by mouth daily.   Yes [provider]  Vitamin D, Ergocalciferol, 50000 units CAPS 1 capsule.   Yes [provider]  Continuous Blood Gluc Receiver (DEXCOM G7 RECEIVER) DEVI Use to check blood sugar 3 times daily. 06/02/22   Newlin, Enobong, MD  Continuous Glucose Sensor (DEXCOM G7 SENSOR) MISC Use to check blood sugar 3 times daily. 12/11/22   Newlin, Enobong, MD  dapagliflozin  propanediol (FARXIGA ) 10 MG TABS tablet Take 1 tablet (10 mg total) by mouth daily. Patient not  taking: Reported on 01/14/2024 04/15/23   Newlin, Enobong, MD  mupirocin  ointment (BACTROBAN ) 2 % Apply 1 Application topically 2 (two) times daily. Patient not taking: Reported on 01/14/2024 11/25/23   Floyce Hutching, DPM    Family History Family History  Problem Relation Age of Onset   Diabetes Mother    Hypertension Mother    Arthritis Mother        RA   Pancreatic cancer Father    Stomach cancer Neg Hx    Colon cancer Neg Hx    Esophageal cancer Neg Hx    Rectal cancer Neg Hx     Social History Social History   Tobacco Use   Smoking status: Never   Smokeless tobacco: Never  Vaping Use   Vaping status: Never Used  Substance Use Topics   Alcohol use: No   Drug use: No     Allergies   Acetaminophen , Metformin , Propoxyphene, Aspirin , Darvocet [propoxyphene n-acetaminophen ], Erythromycin, Gabapentin, Ibuprofen, and Metformin  and related   Review of Systems Review of Systems   Physical Exam Triage Vital Signs ED Triage Vitals  Encounter Vitals Group     BP 01/14/24 1305 130/83     Systolic BP Percentile --      Diastolic BP Percentile --      Pulse Rate 01/14/24 1305 91     Resp 01/14/24 1305 18     Temp 01/14/24 1305 97.9 F (36.6 C)     Temp Source 01/14/24 1305 Oral     SpO2 01/14/24 1305 98 %     Weight --      Height --      Head Circumference --      Peak Flow --      Pain Score 01/14/24 1303 10     Pain Loc --      Pain Education --      Exclude from Growth Chart --    No data found.  Updated Vital Signs BP 130/83 (BP Location: Left Arm)   Pulse 91   Temp 97.9 F (36.6 C) (Oral)   Resp 18   LMP 11/16/2014 (Approximate)   SpO2 98%   Visual Acuity Right Eye Distance:   Left Eye Distance:   Bilateral Distance:    Right Eye Near:   Left Eye Near:  Bilateral Near:     Physical Exam Vitals reviewed.  Constitutional:      General: She is not in acute distress.    Appearance: She is not toxic-appearing or diaphoretic.     Comments:  Plan, in no acute respiratory distress  HENT:     Mouth/Throat:     Mouth: Mucous membranes are moist.     Pharynx: No oropharyngeal exudate or posterior oropharyngeal erythema.  Eyes:     Extraocular Movements: Extraocular movements intact.     Pupils: Pupils are equal, round, and reactive to light.  Cardiovascular:     Rate and Rhythm: Normal rate and regular rhythm.     Heart sounds: No murmur heard. Pulmonary:     Effort: Pulmonary effort is normal.     Breath sounds: Normal breath sounds.  Abdominal:     Palpations: Abdomen is soft.     Tenderness: There is abdominal tenderness (suprapubic).  Musculoskeletal:     Cervical back: Neck supple.  Lymphadenopathy:     Cervical: No cervical adenopathy.  Skin:    Capillary Refill: Capillary refill takes less than 2 seconds.     Coloration: Skin is not jaundiced or pale.  Neurological:     General: No focal deficit present.     Mental Status: She is alert and oriented to person, place, and time.  Psychiatric:        Behavior: Behavior normal.      UC Treatments / Results  Labs (all labs ordered are listed, but only abnormal results are displayed) Labs Reviewed  POCT URINALYSIS DIP (MANUAL ENTRY) - Abnormal; Notable for the following components:      Result Value   Clarity, UA cloudy (*)    Glucose, UA =500 (*)    Blood, UA moderate (*)    Protein Ur, POC >=300 (*)    Nitrite, UA Positive (*)    Leukocytes, UA Trace (*)    All other components within normal limits  URINE CULTURE    EKG   Radiology No results found.  Procedures Procedures (including critical care time)  Medications Ordered in UC Medications - No data to display  Initial Impression / Assessment and Plan / UC Course  I have reviewed the triage vital signs and the nursing notes.  Pertinent labs & imaging results that were available during my care of the patient were reviewed by me and considered in my medical decision making (see chart for  details).     Urinalysis is abnormal with a trace of leukocytes, positive nitrites, and moderate amount of blood.  He does have a 500 mg percent of glucose.  Urine culture is sent.  Cipro  sent in for the UTI along with Pyridium for the discomfort and pain. She requests some pain pills; I have discussed with her that the Pyridium should help a good bit and then she can use Tylenol  on top of that Staff will notify her the culture so she needs a different antibiotic.  I have also asked her to follow-up with her primary care about this issue Final Clinical Impressions(s) / UC Diagnoses   Final diagnoses:  Cystitis     Discharge Instructions      The urinalysis was abnormal with white blood cells, red blood cells, and nitrites, consistent with a urinary infection.  Take Cipro  500 mg--1 tablet 2 times daily for 7 days  Take Pyridium/phenazopyridine 100 mg--1 tablet 3 times daily as needed for urinary pain.  This medication usually makes the  urine orange    ED Prescriptions     Medication Sig Dispense Auth. Provider   cefdinir (OMNICEF) 300 MG capsule  (Status: Discontinued) Take 2 capsules (600 mg total) by mouth daily for 7 days. 14 capsule Yussuf Sawyers K, MD   phenazopyridine (PYRIDIUM) 100 MG tablet Take 1 tablet (100 mg total) by mouth 3 (three) times daily as needed (urinary pain). 10 tablet Ann Keto, MD   ciprofloxacin  (CIPRO ) 250 MG tablet Take 1 tablet (250 mg total) by mouth 2 (two) times daily for 7 days. 14 tablet Akasha Melena K, MD   phenazopyridine (PYRIDIUM) 100 MG tablet Take 1 tablet (100 mg total) by mouth 3 (three) times daily as needed (urinary pain). 10 tablet Ellsworth Haas Paige Boatman, MD      I have reviewed the PDMP during this encounter.   Ann Keto, MD 01/14/24 [phone]

## 2024-01-14 NOTE — ED Triage Notes (Signed)
 Pt seen in the ED on 5/18. States "I still have the same infection I had in the ED". States she was treated with keflex  but symptoms have not resolved. Also reports she had a UTI that took several rounds of antibiotics to treat last year.

## 2024-01-17 ENCOUNTER — Ambulatory Visit (HOSPITAL_COMMUNITY): Payer: Self-pay

## 2024-01-17 ENCOUNTER — Encounter: Payer: Medicaid Other | Admitting: Internal Medicine

## 2024-01-17 LAB — URINE CULTURE: Culture: 60000 — AB

## 2024-01-18 ENCOUNTER — Ambulatory Visit: Admitting: Nurse Practitioner

## 2024-01-26 ENCOUNTER — Ambulatory Visit: Admitting: Gastroenterology

## 2024-02-17 ENCOUNTER — Other Ambulatory Visit: Payer: Self-pay

## 2024-03-02 ENCOUNTER — Inpatient Hospital Stay (HOSPITAL_COMMUNITY): Admitting: Certified Registered Nurse Anesthetist

## 2024-03-02 ENCOUNTER — Inpatient Hospital Stay (HOSPITAL_COMMUNITY)

## 2024-03-02 ENCOUNTER — Inpatient Hospital Stay (HOSPITAL_COMMUNITY)
Admission: EM | Admit: 2024-03-02 | Discharge: 2024-03-05 | DRG: 660 | Disposition: A | Discharge status: Home / Self Care | Attending: Family Medicine | Admitting: Family Medicine

## 2024-03-02 ENCOUNTER — Emergency Department (HOSPITAL_COMMUNITY)

## 2024-03-02 ENCOUNTER — Other Ambulatory Visit: Payer: Self-pay

## 2024-03-02 ENCOUNTER — Encounter (HOSPITAL_COMMUNITY): Payer: Self-pay

## 2024-03-02 ENCOUNTER — Encounter (HOSPITAL_COMMUNITY)
Admission: EM | Disposition: A | Discharge status: Home / Self Care | Payer: Self-pay | Source: Home / Self Care | Attending: Family Medicine

## 2024-03-02 DIAGNOSIS — Z800 Family history of malignant neoplasm of digestive organs: Secondary | ICD-10-CM | POA: Diagnosis not present

## 2024-03-02 DIAGNOSIS — Z888 Allergy status to other drugs, medicaments and biological substances status: Secondary | ICD-10-CM

## 2024-03-02 DIAGNOSIS — N136 Pyonephrosis: Principal | ICD-10-CM | POA: Diagnosis present

## 2024-03-02 DIAGNOSIS — B961 Klebsiella pneumoniae [K. pneumoniae] as the cause of diseases classified elsewhere: Secondary | ICD-10-CM | POA: Diagnosis present

## 2024-03-02 DIAGNOSIS — Z79899 Other long term (current) drug therapy: Secondary | ICD-10-CM

## 2024-03-02 DIAGNOSIS — I110 Hypertensive heart disease with heart failure: Secondary | ICD-10-CM

## 2024-03-02 DIAGNOSIS — E876 Hypokalemia: Secondary | ICD-10-CM | POA: Diagnosis present

## 2024-03-02 DIAGNOSIS — I5042 Chronic combined systolic (congestive) and diastolic (congestive) heart failure: Secondary | ICD-10-CM | POA: Diagnosis present

## 2024-03-02 DIAGNOSIS — Z7982 Long term (current) use of aspirin: Secondary | ICD-10-CM

## 2024-03-02 DIAGNOSIS — Z87440 Personal history of urinary (tract) infections: Secondary | ICD-10-CM

## 2024-03-02 DIAGNOSIS — E1165 Type 2 diabetes mellitus with hyperglycemia: Secondary | ICD-10-CM | POA: Diagnosis present

## 2024-03-02 DIAGNOSIS — R627 Adult failure to thrive: Secondary | ICD-10-CM | POA: Diagnosis present

## 2024-03-02 DIAGNOSIS — I779 Disorder of arteries and arterioles, unspecified: Secondary | ICD-10-CM | POA: Diagnosis present

## 2024-03-02 DIAGNOSIS — E1151 Type 2 diabetes mellitus with diabetic peripheral angiopathy without gangrene: Secondary | ICD-10-CM | POA: Diagnosis not present

## 2024-03-02 DIAGNOSIS — Z833 Family history of diabetes mellitus: Secondary | ICD-10-CM

## 2024-03-02 DIAGNOSIS — Z8261 Family history of arthritis: Secondary | ICD-10-CM

## 2024-03-02 DIAGNOSIS — F331 Major depressive disorder, recurrent, moderate: Secondary | ICD-10-CM | POA: Diagnosis present

## 2024-03-02 DIAGNOSIS — Z794 Long term (current) use of insulin: Secondary | ICD-10-CM

## 2024-03-02 DIAGNOSIS — E871 Hypo-osmolality and hyponatremia: Secondary | ICD-10-CM | POA: Diagnosis present

## 2024-03-02 DIAGNOSIS — I5022 Chronic systolic (congestive) heart failure: Secondary | ICD-10-CM | POA: Diagnosis present

## 2024-03-02 DIAGNOSIS — Z885 Allergy status to narcotic agent status: Secondary | ICD-10-CM

## 2024-03-02 DIAGNOSIS — K5730 Diverticulosis of large intestine without perforation or abscess without bleeding: Secondary | ICD-10-CM | POA: Diagnosis not present

## 2024-03-02 DIAGNOSIS — E861 Hypovolemia: Secondary | ICD-10-CM | POA: Diagnosis present

## 2024-03-02 DIAGNOSIS — N1330 Unspecified hydronephrosis: Secondary | ICD-10-CM | POA: Diagnosis not present

## 2024-03-02 DIAGNOSIS — E119 Type 2 diabetes mellitus without complications: Secondary | ICD-10-CM | POA: Diagnosis not present

## 2024-03-02 DIAGNOSIS — I2510 Atherosclerotic heart disease of native coronary artery without angina pectoris: Secondary | ICD-10-CM

## 2024-03-02 DIAGNOSIS — R109 Unspecified abdominal pain: Secondary | ICD-10-CM | POA: Diagnosis not present

## 2024-03-02 DIAGNOSIS — E860 Dehydration: Secondary | ICD-10-CM | POA: Diagnosis not present

## 2024-03-02 DIAGNOSIS — N390 Urinary tract infection, site not specified: Secondary | ICD-10-CM | POA: Diagnosis not present

## 2024-03-02 DIAGNOSIS — N134 Hydroureter: Secondary | ICD-10-CM | POA: Diagnosis not present

## 2024-03-02 DIAGNOSIS — Z881 Allergy status to other antibiotic agents status: Secondary | ICD-10-CM

## 2024-03-02 DIAGNOSIS — I10 Essential (primary) hypertension: Secondary | ICD-10-CM | POA: Diagnosis present

## 2024-03-02 DIAGNOSIS — Q6211 Congenital occlusion of ureteropelvic junction: Secondary | ICD-10-CM

## 2024-03-02 DIAGNOSIS — N1339 Other hydronephrosis: Secondary | ICD-10-CM | POA: Diagnosis not present

## 2024-03-02 DIAGNOSIS — N179 Acute kidney failure, unspecified: Secondary | ICD-10-CM | POA: Diagnosis not present

## 2024-03-02 DIAGNOSIS — N3289 Other specified disorders of bladder: Secondary | ICD-10-CM | POA: Diagnosis not present

## 2024-03-02 DIAGNOSIS — R1314 Dysphagia, pharyngoesophageal phase: Secondary | ICD-10-CM | POA: Diagnosis present

## 2024-03-02 DIAGNOSIS — Z886 Allergy status to analgesic agent status: Secondary | ICD-10-CM | POA: Diagnosis not present

## 2024-03-02 DIAGNOSIS — K219 Gastro-esophageal reflux disease without esophagitis: Secondary | ICD-10-CM | POA: Diagnosis present

## 2024-03-02 DIAGNOSIS — Z8249 Family history of ischemic heart disease and other diseases of the circulatory system: Secondary | ICD-10-CM

## 2024-03-02 DIAGNOSIS — R1084 Generalized abdominal pain: Secondary | ICD-10-CM | POA: Diagnosis not present

## 2024-03-02 DIAGNOSIS — Z0389 Encounter for observation for other suspected diseases and conditions ruled out: Secondary | ICD-10-CM | POA: Diagnosis not present

## 2024-03-02 DIAGNOSIS — E785 Hyperlipidemia, unspecified: Secondary | ICD-10-CM | POA: Diagnosis not present

## 2024-03-02 DIAGNOSIS — N130 Hydronephrosis with ureteropelvic junction obstruction: Secondary | ICD-10-CM | POA: Diagnosis not present

## 2024-03-02 DIAGNOSIS — D649 Anemia, unspecified: Secondary | ICD-10-CM | POA: Diagnosis present

## 2024-03-02 DIAGNOSIS — R1319 Other dysphagia: Secondary | ICD-10-CM | POA: Diagnosis present

## 2024-03-02 DIAGNOSIS — Z7902 Long term (current) use of antithrombotics/antiplatelets: Secondary | ICD-10-CM

## 2024-03-02 HISTORY — PX: CYSTOSCOPY W/ URETERAL STENT PLACEMENT: SHX1429

## 2024-03-02 LAB — CBC WITH DIFFERENTIAL/PLATELET
Abs Immature Granulocytes: 0.07 K/uL (ref 0.00–0.07)
Basophils Absolute: 0 K/uL (ref 0.0–0.1)
Basophils Relative: 0 %
Eosinophils Absolute: 0 K/uL (ref 0.0–0.5)
Eosinophils Relative: 0 %
HCT: 29.4 % — ABNORMAL LOW (ref 36.0–46.0)
Hemoglobin: 9.5 g/dL — ABNORMAL LOW (ref 12.0–15.0)
Immature Granulocytes: 1 %
Lymphocytes Relative: 16 %
Lymphs Abs: 2.2 K/uL (ref 0.7–4.0)
MCH: 25.5 pg — ABNORMAL LOW (ref 26.0–34.0)
MCHC: 32.3 g/dL (ref 30.0–36.0)
MCV: 78.8 fL — ABNORMAL LOW (ref 80.0–100.0)
Monocytes Absolute: 0.9 K/uL (ref 0.1–1.0)
Monocytes Relative: 7 %
Neutro Abs: 10.5 K/uL — ABNORMAL HIGH (ref 1.7–7.7)
Neutrophils Relative %: 76 %
Platelets: 444 K/uL — ABNORMAL HIGH (ref 150–400)
RBC: 3.73 MIL/uL — ABNORMAL LOW (ref 3.87–5.11)
RDW: 13.2 % (ref 11.5–15.5)
WBC: 13.9 K/uL — ABNORMAL HIGH (ref 4.0–10.5)
nRBC: 0 % (ref 0.0–0.2)

## 2024-03-02 LAB — URINALYSIS, W/ REFLEX TO CULTURE (INFECTION SUSPECTED)
Bilirubin Urine: NEGATIVE
Glucose, UA: 150 mg/dL — AB
Ketones, ur: NEGATIVE mg/dL
Nitrite: NEGATIVE
Protein, ur: 30 mg/dL — AB
Specific Gravity, Urine: 1.011 (ref 1.005–1.030)
WBC, UA: >50 WBC/hpf (ref 0–5)
pH: 5 (ref 5.0–8.0)

## 2024-03-02 LAB — COMPREHENSIVE METABOLIC PANEL WITH GFR
ALT: 8 U/L (ref 0–44)
AST: 12 U/L — ABNORMAL LOW (ref 15–41)
Albumin: 2.5 g/dL — ABNORMAL LOW (ref 3.5–5.0)
Alkaline Phosphatase: 94 U/L (ref 38–126)
Anion gap: 15 (ref 5–15)
BUN: 16 mg/dL (ref 8–23)
CO2: 26 mmol/L (ref 22–32)
Calcium: 8.9 mg/dL (ref 8.9–10.3)
Chloride: 88 mmol/L — ABNORMAL LOW (ref 98–111)
Creatinine, Ser: 1.68 mg/dL — ABNORMAL HIGH (ref 0.44–1.00)
GFR, Estimated: 34 mL/min — ABNORMAL LOW (ref >60)
Glucose, Bld: 295 mg/dL — ABNORMAL HIGH (ref 70–99)
Potassium: 3.6 mmol/L (ref 3.5–5.1)
Sodium: 129 mmol/L — ABNORMAL LOW (ref 135–145)
Total Bilirubin: 1 mg/dL (ref 0.0–1.2)
Total Protein: 8.4 g/dL — ABNORMAL HIGH (ref 6.5–8.1)

## 2024-03-02 LAB — I-STAT CHEM 8, ED
BUN: 17 mg/dL (ref 8–23)
Calcium, Ion: 1.13 mmol/L — ABNORMAL LOW (ref 1.15–1.40)
Chloride: 90 mmol/L — ABNORMAL LOW (ref 98–111)
Creatinine, Ser: 1.6 mg/dL — ABNORMAL HIGH (ref 0.44–1.00)
Glucose, Bld: 295 mg/dL — ABNORMAL HIGH (ref 70–99)
HCT: 30 % — ABNORMAL LOW (ref 36.0–46.0)
Hemoglobin: 10.2 g/dL — ABNORMAL LOW (ref 12.0–15.0)
Potassium: 3.7 mmol/L (ref 3.5–5.1)
Sodium: 131 mmol/L — ABNORMAL LOW (ref 135–145)
TCO2: 27 mmol/L (ref 22–32)

## 2024-03-02 LAB — PROTIME-INR
INR: 1.2 (ref 0.8–1.2)
Prothrombin Time: 15.7 s — ABNORMAL HIGH (ref 11.4–15.2)

## 2024-03-02 LAB — GLUCOSE, CAPILLARY
Glucose-Capillary: 190 mg/dL — ABNORMAL HIGH (ref 70–99)
Glucose-Capillary: 186 mg/dL — ABNORMAL HIGH (ref 70–99)

## 2024-03-02 LAB — I-STAT CG4 LACTIC ACID, ED
Lactic Acid, Venous: 1.5 mmol/L (ref 0.5–1.9)
Lactic Acid, Venous: 1.6 mmol/L (ref 0.5–1.9)

## 2024-03-02 SURGERY — CYSTOSCOPY, WITH RETROGRADE PYELOGRAM AND URETERAL STENT INSERTION
Anesthesia: General | Laterality: Right

## 2024-03-02 MED ORDER — PROPOFOL 10 MG/ML IV BOLUS
INTRAVENOUS | Status: AC
  Filled 2024-03-02: qty 20

## 2024-03-02 MED ORDER — LACTATED RINGERS IV SOLN: INTRAVENOUS | Status: AC

## 2024-03-02 MED ORDER — CHLORHEXIDINE GLUCONATE 0.12 % MT SOLN
15.0000 mL | Freq: Once | OROMUCOSAL | Status: AC
  Filled 2024-03-02: qty 15

## 2024-03-02 MED ORDER — MORPHINE SULFATE (PF) 2 MG/ML IV SOLN: 2.0000 mg | INTRAVENOUS | Status: DC | PRN

## 2024-03-02 MED ORDER — LACTATED RINGERS IV SOLN: INTRAVENOUS | Status: DC

## 2024-03-02 MED ORDER — ONDANSETRON HCL 4 MG/2ML IJ SOLN
INTRAMUSCULAR | Status: DC | PRN
  Administered 2024-03-02: 4 mg via INTRAVENOUS

## 2024-03-02 MED ORDER — DEXAMETHASONE SODIUM PHOSPHATE 10 MG/ML IJ SOLN
INTRAMUSCULAR | Status: DC | PRN
  Administered 2024-03-02: 5 mg via INTRAVENOUS

## 2024-03-02 MED ORDER — SODIUM CHLORIDE 0.9 % IR SOLN
Status: DC | PRN
  Administered 2024-03-02: 1000 mL via INTRAVESICAL
  Administered 2024-03-02: 3000 mL via INTRAVESICAL

## 2024-03-02 MED ORDER — INSULIN ASPART 100 UNIT/ML IJ SOLN
0.0000 [IU] | INTRAMUSCULAR | Status: DC | PRN
  Administered 2024-03-02: 2 [IU] via SUBCUTANEOUS
  Filled 2024-03-02: qty 1

## 2024-03-02 MED ORDER — CHLORHEXIDINE GLUCONATE 0.12 % MT SOLN
OROMUCOSAL | Status: AC
  Administered 2024-03-02: 15 mL via OROMUCOSAL
  Filled 2024-03-02: qty 15

## 2024-03-02 MED ORDER — INSULIN ASPART 100 UNIT/ML IJ SOLN
0.0000 [IU] | Freq: Every day | INTRAMUSCULAR | Status: DC
  Administered 2024-03-03: 3 [IU] via SUBCUTANEOUS
  Administered 2024-03-04: 2 [IU] via SUBCUTANEOUS

## 2024-03-02 MED ORDER — PROPOFOL 10 MG/ML IV BOLUS
INTRAVENOUS | Status: DC | PRN
  Administered 2024-03-02: 80 mg via INTRAVENOUS

## 2024-03-02 MED ORDER — DIAZEPAM 5 MG PO TABS: 5.0000 mg | ORAL_TABLET | Freq: Every evening | ORAL | Status: DC | PRN

## 2024-03-02 MED ORDER — METOPROLOL SUCCINATE ER 25 MG PO TB24
25.0000 mg | ORAL_TABLET | Freq: Every day | ORAL | Status: DC
  Administered 2024-03-03 – 2024-03-05 (×3): 25 mg via ORAL
  Filled 2024-03-02 (×3): qty 1

## 2024-03-02 MED ORDER — LACTATED RINGERS IV BOLUS
1000.0000 mL | Freq: Once | INTRAVENOUS | Status: AC
Start: 2024-03-02 — End: 2024-03-02
  Administered 2024-03-02: 1000 mL via INTRAVENOUS

## 2024-03-02 MED ORDER — METOCLOPRAMIDE HCL 5 MG/ML IJ SOLN
10.0000 mg | Freq: Once | INTRAMUSCULAR | Status: AC
  Administered 2024-03-02: 10 mg via INTRAVENOUS
  Filled 2024-03-02: qty 2

## 2024-03-02 MED ORDER — MORPHINE SULFATE (PF) 4 MG/ML IV SOLN
4.0000 mg | Freq: Once | INTRAVENOUS | Status: AC
  Administered 2024-03-02: 4 mg via INTRAVENOUS
  Filled 2024-03-02: qty 1

## 2024-03-02 MED ORDER — ATORVASTATIN CALCIUM 80 MG PO TABS
80.0000 mg | ORAL_TABLET | Freq: Every day | ORAL | Status: DC
  Administered 2024-03-02 – 2024-03-05 (×4): 80 mg via ORAL
  Filled 2024-03-02 (×4): qty 1

## 2024-03-02 MED ORDER — PHENYLEPHRINE 80 MCG/ML (10ML) SYRINGE FOR IV PUSH (FOR BLOOD PRESSURE SUPPORT)
PREFILLED_SYRINGE | INTRAVENOUS | Status: DC | PRN
  Administered 2024-03-02: 80 ug via INTRAVENOUS

## 2024-03-02 MED ORDER — FENTANYL CITRATE (PF) 100 MCG/2ML IJ SOLN: 25.0000 ug | INTRAMUSCULAR | Status: DC | PRN

## 2024-03-02 MED ORDER — ONDANSETRON HCL 4 MG PO TABS: 4.0000 mg | ORAL_TABLET | Freq: Four times a day (QID) | ORAL | Status: DC | PRN

## 2024-03-02 MED ORDER — PHENYLEPHRINE HCL-NACL 20-0.9 MG/250ML-% IV SOLN
INTRAVENOUS | Status: DC | PRN
  Administered 2024-03-02: 25 ug/min via INTRAVENOUS

## 2024-03-02 MED ORDER — IOHEXOL 300 MG/ML  SOLN
INTRAMUSCULAR | Status: DC | PRN
  Administered 2024-03-02: 38 mL

## 2024-03-02 MED ORDER — CLOPIDOGREL BISULFATE 75 MG PO TABS
75.0000 mg | ORAL_TABLET | Freq: Every day | ORAL | Status: DC
Start: 2024-03-02 — End: 2024-03-05
  Administered 2024-03-02 – 2024-03-05 (×4): 75 mg via ORAL
  Filled 2024-03-02 (×4): qty 1

## 2024-03-02 MED ORDER — LIDOCAINE 2% (20 MG/ML) 5 ML SYRINGE
INTRAMUSCULAR | Status: DC | PRN
  Administered 2024-03-02: 40 mg via INTRAVENOUS

## 2024-03-02 MED ORDER — PANTOPRAZOLE SODIUM 20 MG PO TBEC: 20.0000 mg | DELAYED_RELEASE_TABLET | Freq: Every day | ORAL | Status: DC

## 2024-03-02 MED ORDER — INSULIN ASPART 100 UNIT/ML IJ SOLN
0.0000 [IU] | Freq: Three times a day (TID) | INTRAMUSCULAR | Status: DC
  Administered 2024-03-03 (×2): 15 [IU] via SUBCUTANEOUS

## 2024-03-02 MED ORDER — FESOTERODINE FUMARATE ER 4 MG PO TB24: 4.0000 mg | ORAL_TABLET | Freq: Every day | ORAL | Status: DC

## 2024-03-02 MED ORDER — ORAL CARE MOUTH RINSE: 15.0000 mL | Freq: Once | OROMUCOSAL | Status: AC

## 2024-03-02 MED ORDER — SACUBITRIL-VALSARTAN 49-51 MG PO TABS
1.0000 | ORAL_TABLET | Freq: Two times a day (BID) | ORAL | Status: DC
  Administered 2024-03-02 – 2024-03-03 (×2): 1 via ORAL
  Filled 2024-03-02 (×3): qty 1

## 2024-03-02 MED ORDER — IOHEXOL 350 MG/ML SOLN
40.0000 mL | Freq: Once | INTRAVENOUS | Status: AC | PRN
  Administered 2024-03-02: 40 mL via INTRAVENOUS

## 2024-03-02 MED ORDER — ONDANSETRON HCL 4 MG/2ML IJ SOLN
INTRAMUSCULAR | Status: AC
  Filled 2024-03-02: qty 2

## 2024-03-02 MED ORDER — DROPERIDOL 2.5 MG/ML IJ SOLN: 0.6250 mg | Freq: Once | INTRAMUSCULAR | Status: DC | PRN

## 2024-03-02 MED ORDER — SPIRONOLACTONE 25 MG PO TABS
25.0000 mg | ORAL_TABLET | Freq: Every day | ORAL | Status: DC
  Administered 2024-03-02 – 2024-03-03 (×2): 25 mg via ORAL
  Filled 2024-03-02 (×2): qty 1

## 2024-03-02 MED ORDER — PHENAZOPYRIDINE HCL 100 MG PO TABS: 100.0000 mg | ORAL_TABLET | Freq: Three times a day (TID) | ORAL | Status: DC | PRN

## 2024-03-02 MED ORDER — DIPHENHYDRAMINE HCL 50 MG/ML IJ SOLN
25.0000 mg | Freq: Once | INTRAMUSCULAR | Status: AC
  Administered 2024-03-02: 25 mg via INTRAVENOUS
  Filled 2024-03-02: qty 1

## 2024-03-02 MED ORDER — SODIUM CHLORIDE 0.9 % IV BOLUS
1000.0000 mL | Freq: Once | INTRAVENOUS | Status: AC
  Administered 2024-03-02: 1000 mL via INTRAVENOUS

## 2024-03-02 MED ORDER — ONDANSETRON HCL 4 MG/2ML IJ SOLN: 4.0000 mg | Freq: Four times a day (QID) | INTRAMUSCULAR | Status: DC | PRN

## 2024-03-02 MED ORDER — PHENYLEPHRINE HCL (PRESSORS) 10 MG/ML IV SOLN
INTRAVENOUS | Status: AC
Start: 2024-03-02 — End: 2024-03-02
  Filled 2024-03-02: qty 1

## 2024-03-02 MED ORDER — HYDRALAZINE HCL 20 MG/ML IJ SOLN: 10.0000 mg | Freq: Four times a day (QID) | INTRAMUSCULAR | Status: DC | PRN

## 2024-03-02 MED ORDER — SODIUM CHLORIDE 0.9 % IV SOLN
1.0000 g | Freq: Once | INTRAVENOUS | Status: AC
  Administered 2024-03-02: 1 g via INTRAVENOUS
  Filled 2024-03-02: qty 10

## 2024-03-02 MED ORDER — DEXAMETHASONE SODIUM PHOSPHATE 10 MG/ML IJ SOLN
INTRAMUSCULAR | Status: AC
Start: 2024-03-02 — End: 2024-03-02
  Filled 2024-03-02: qty 1

## 2024-03-02 MED ORDER — PHENYLEPHRINE HCL (PRESSORS) 10 MG/ML IV SOLN
INTRAVENOUS | Status: AC
  Filled 2024-03-02: qty 1

## 2024-03-02 SURGICAL SUPPLY — 13 items
BAG DRAIN URO-CYSTO SKYTR STRL (DRAIN) ×2 IMPLANT
CATH URETL OPEN END 6FR 70 (CATHETERS) IMPLANT
GLOVE BIOGEL M 7.0 STRL (GLOVE) ×2 IMPLANT
GOWN STRL REUS W/TWL LRG LVL3 (GOWN DISPOSABLE) ×2 IMPLANT
GUIDEWIRE STR DUAL SENSOR (WIRE) ×4 IMPLANT
IV NS 1000ML BAXH (IV SOLUTION) ×2 IMPLANT
KIT TURNOVER KIT B (KITS) IMPLANT
MANIFOLD NEPTUNE II (INSTRUMENTS) ×2 IMPLANT
PACK CYSTO (CUSTOM PROCEDURE TRAY) ×2 IMPLANT
STENT CONTOUR 6FRX24X.038 (STENTS) IMPLANT
SYRINGE TOOMEY IRRIG 70ML (MISCELLANEOUS) IMPLANT
TRAY FOLEY MTR SLVR 16FR STAT (SET/KITS/TRAYS/PACK) IMPLANT
TUBE CONNECTING 12X1/4 (SUCTIONS) ×2 IMPLANT

## 2024-03-02 NOTE — Anesthesia Preprocedure Evaluation (Addendum)
 Anesthesia Evaluation  Patient identified by MRN, date of birth, ID band Patient awake    Reviewed: Allergy & Precautions, NPO status , Patient's Chart, lab work & pertinent test results  Airway Mallampati: II  TM Distance: >3 FB Neck ROM: Full    Dental  (+) Edentulous Upper, Edentulous Lower   Pulmonary neg pulmonary ROS   Pulmonary exam normal breath sounds clear to auscultation       Cardiovascular hypertension, Pt. on medications and Pt. on home beta blockers + CAD, + Peripheral Vascular Disease and +CHF   Rhythm:Regular Rate:Normal  EKG 10/31/21 NSR  Echo 05/01/22 1. EF significantly improved since TTE done 10/09/21. Left ventricular ejection fraction, by estimation, is 55 to 60%. The left ventricle has normal function. The left ventricle has no regional wall motion abnormalities. Left ventricular diastolic parameters are consistent with Grade I diastolic dysfunction (impaired relaxation).   2. Right ventricular systolic function is normal. The right ventricular size is normal.   3. The mitral valve is abnormal. No evidence of mitral valve regurgitation. No evidence of mitral stenosis. Moderate mitral annular calcification.   4. The aortic valve was not well visualized. Aortic valve regurgitation is not visualized. No aortic stenosis is present.   5. The inferior vena cava is normal in size with greater than 50% respiratory variability, suggesting right atrial pressure of 3 mmHg.   Cardiac Cath 10/09/21   Prox RCA lesion is 100% stenosed.   Ost LM lesion is 60% stenosed.   Mid LAD lesion is 80% stenosed.   2nd Diag lesion is 85% stenosed.   1st Diag lesion is 99% stenosed.   Prox LAD lesion is 80% stenosed.   Mid Cx lesion is 70% stenosed.   The left ventricular ejection fraction is 25-35% by visual estimate.   1.  Severe multivessel disease. 2.  Severe LV dysfunction 3.  Fick cardiac output of 5.2 L/min and Fick  cardiac index of 3.4 L a minute per meter squared with a PA saturation of 68%. 4.  Thermodilution outputs done in triplicate demonstrated a cardiac output of 3 L/min and an index of 2 L/min/m; milrinone  was increased to 2.5 mcg per kilogram per minute. 5.  Cardiac hemodynamics as follows:   A.  Right atrial pressure 9/7 with a mean of 5 mmHg B.  RV 38/3 with an end-diastolic pressure of 18 mmHg C.  Wedge pressure of 12/11 with a mean of 10 mmHg D.  Pulmonary artery pressure of 23/12 with a mean pressure of 17 mmHg; PAPI =9/5=1.8 E.  LVEDP of .     Neuro/Psych  PSYCHIATRIC DISORDERS Anxiety Depression    negative neurological ROS     GI/Hepatic Neg liver ROS,,,Non restorable teeth   Endo/Other  diabetes, Poorly Controlled, Type 2, Oral Hypoglycemic Agents  Hyperlipidemia  Renal/GU Renal disease     Musculoskeletal negative musculoskeletal ROS (+)    Abdominal   Peds  Hematology   Anesthesia Other Findings   Reproductive/Obstetrics                              Anesthesia Physical Anesthesia Plan  ASA: 3  Anesthesia Plan: General   Post-op Pain Management: Minimal or no pain anticipated   Induction:   PONV Risk Score and Plan: 4 or greater and Treatment may vary due to age or medical condition, Ondansetron  and Dexamethasone   Airway Management Planned: LMA  Additional Equipment: None  Intra-op Plan:  Post-operative Plan: Extubation in OR  Informed Consent: I have reviewed the patients History and Physical, chart, labs and discussed the procedure including the risks, benefits and alternatives for the proposed anesthesia with the patient or authorized representative who has indicated his/her understanding and acceptance.     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 Admitted 3/23 with acute CHF and  cardiogenic shock.She was started on empiric milrinone  and diuresed with IV lasix . EchowithLVEF 20-25%, GIIDD, RV moderately reduced, trivial MR. R/LHC:severe multivessel CAD with severe LV dysfunction, low filling pressures and low output. Milrinone  increased and AHF consulted. Not CABG candidate with poor targets, malnutrition and LV systolic dysfunction. Cardiac MRI showed LVEF 24%with < 50% wall thickness LGE in all areas except apex.Coronary anatomy not favorable for PCI. Milrinone  weaned off and GDMT titrated. Insulin  started with A1C 11.5. Discharged home, weight 100 lbs. Repeat Echoon 9/22/2023EF 55-60%, grade I DD, RV okay. Last seen by Dr. Jeffrie 06/04/22, noted to be euvolemic, NYHA II. Recommended continue GDMT with entresto , spironolactone , farxiga , metoprolol . Also maintained on ASA and Plavix  for ASA. 4 month followup recommended. Cardiac clearance per telephone encounter by Josefa Beauvais, NP on 06/29/22, Chart reviewed as part of pre-operative protocol coverage. Given past medical history and time since last visit, based on ACC/AHA guidelines,Anuradha Williamswould be at acceptable risk for the planned procedure without further cardiovascular testing. Her Plavix  may be held for 5 days prior to her procedure. Her aspirin  should be continued throughout her procedure. Please resume Plavix  as soon as hemostasis is achieved.  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  Pt will need DOS labs and eval.  EKG 10/31/21: Normal sinus rhythm. Rate 85. Nonspecific T wave abnormality  TTE 05/01/22: 1. EF significantly improved since TTE done 10/09/21. Left ventricular  ejection fraction, by estimation, is 55 to 60%. The left ventricle has  normal function. The left ventricle has no regional wall motion  abnormalities. Left ventricular diastolic  parameters are consistent with Grade I diastolic dysfunction (impaired  relaxation).  2. Right ventricular systolic function is normal. The right  ventricular  size is normal.  3. The mitral valve is abnormal. No evidence of mitral valve  regurgitation. No evidence of mitral stenosis. Moderate mitral annular  calcification.  4. The aortic valve was not well visualized. Aortic valve regurgitation  is not visualized. No aortic stenosis is present.  5. The inferior vena cava is normal in size with greater than 50%  respiratory variability, suggesting right atrial pressure of 3 mmHg.  R/L cath 10/09/2021:  Prox RCA lesion is 100% stenosed.  Ost LM lesion is 60% stenosed.  Mid LAD lesion is 80% stenosed.  2nd Diag lesion is 85% stenosed.  1st Diag lesion is 99% stenosed.  Prox LAD lesion is 80% stenosed.  Mid Cx lesion is 70% stenosed.  The left ventricular ejection fraction is 25-35% by visual estimate.  1. Severe multivessel disease. 2. Severe LV dysfunction 3. Fick cardiac output of 5.2 L/min and Fick cardiac index of 3.4 L a minute per meter squared with a PA saturation of 68%. 4. Thermodilution outputs done in triplicate demonstrated a cardiac output of 3 L/min and an index of 2 L/min/m; milrinone  was increased to 2.5 mcg per kilogram per minute. 5. Cardiac hemodynamics as follows:  A. Right atrial pressure 9/7 with a mean of 5 mmHg B. RV 38/3 with an end-diastolic pressure  of 18 mmHg C. Wedge pressure of 12/11 with a mean of 10 mmHg D. Pulmonary artery pressure of 23/12 with a mean pressure of 17 mmHg; PAPI =9/5=1.8 E. LVEDP of .  The results were reviewed with Dr. Bensimhon. The patient be transferred to the to heart unit for medical optimization. No mechanical circulatory support was pursued.    )         Anesthesia Quick Evaluation

## 2024-03-02 NOTE — ED Notes (Signed)
 Patient transported to X-ray

## 2024-03-02 NOTE — ED Provider Notes (Signed)
**Brooke [name]**  Brooke Brooke [name]   CSN: [id number] Arrival date & time: 03/02/24  1225     Patient presents with: Abdominal Pain, Weakness, and Failure To Thrive   Brooke Brooke [name] is a 63 y.o. female.   63 yo F with a cc of difficulty eating and drinking urinary symptoms fatigue.  Sounds like she has had some urinary symptoms off and on for weeks.  She has been off and on antibiotics.  Has required visits to the ED as well as to the primary care setting.  Feels like she has had some worsening tightening to her abdomen.  Has been getting worse.   Abdominal Pain Weakness Associated symptoms: abdominal pain        Prior to Admission medications   Medication Sig Start Date End Date Taking? Authorizing Provider  aspirin  81 MG chewable tablet Chew 1 tablet (81 mg total) by mouth daily. 12/15/21   Bensimhon, Toribio SAUNDERS, MD  atorvastatin  (LIPITOR ) 80 MG tablet Take 1 tablet (80 mg total) by mouth daily. Please schedule PCP appointment. 11/17/23   Newlin, Enobong, MD  clopidogrel  (PLAVIX ) 75 MG tablet Take 1 tablet (75 mg total) by mouth daily. 02/23/23   Newlin, Enobong, MD  conjugated estrogens  (PREMARIN ) vaginal cream Insert 0.5g vaginally nightly for two weeks, then twice a week after 12/31/22   Zuleta, Kaitlin G, NP  Continuous Blood Gluc Receiver (DEXCOM G7 RECEIVER) DEVI Use to check blood sugar 3 times daily. 06/02/22   Newlin, Enobong, MD  Continuous Glucose Sensor (DEXCOM G7 SENSOR) MISC Use to check blood sugar 3 times daily. 12/11/22   Newlin, Enobong, MD  dapagliflozin  propanediol (FARXIGA ) 10 MG TABS tablet Take 1 tablet (10 mg total) by mouth daily. Patient not taking: Reported on 01/14/2024 04/15/23   Newlin, Enobong, MD  diazepam  (VALIUM ) 5 MG tablet Insert 1 tablet vaginally nightly as needed for muscle spasm/ pelvic pain. 12/30/22   Zuleta, Kaitlin G, NP  glyBURIDE -metformin  (GLUCOVANCE ) 2.5-500 MG tablet 1 tablet with a meal Orally Twice a day for  30 day(s) 09/13/12   [provider]  Insulin  Lispro Prot & Lispro (HUMALOG  MIX 75/25 KWIKPEN) (75-25) 100 UNIT/ML Kwikpen Inject 32 Units into the skin in the morning AND 16 Units every evening. 02/23/23   Newlin, Enobong, MD  Insulin  Pen Needle (PENTIPS) 32G X 4 MM MISC Use as directed with insulin  pen 11/17/22   Newlin, Enobong, MD  loperamide  (IMODIUM  A-D) 2 MG tablet Take 1 tablet (2 mg total) by mouth as needed for diarrhea or loose stools. 02/09/23   Armbruster, Elspeth SQUIBB, MD  metoprolol  succinate (TOPROL -XL) 25 MG 24 hr tablet Take 1 tablet (25 mg total) by mouth daily. Take with or immediately following a meal. 10/11/23   Swinyer, Rosaline HERO, NP  Multiple Vitamin (MULTIVITAMIN WITH MINERALS) TABS tablet Take 1 tablet by mouth daily.    [provider]  mupirocin  ointment (BACTROBAN ) 2 % Apply 1 Application topically 2 (two) times daily. Patient not taking: Reported on 01/14/2024 11/25/23   Silva Juliene SAUNDERS, DPM  pantoprazole  (PROTONIX ) 20 MG tablet Take 1 tablet (20 mg total) by mouth daily. 08/17/23   Armbruster, Elspeth SQUIBB, MD  phenazopyridine  (PYRIDIUM ) 100 MG tablet Take 1 tablet (100 mg total) by mouth 3 (three) times daily as needed (urinary pain). 01/14/24   Vonna Sharlet POUR, MD  phenazopyridine  (PYRIDIUM ) 100 MG tablet Take 1 tablet (100 mg total) by mouth 3 (three) times daily as needed (urinary pain). 01/14/24  Vonna Sharlet POUR, MD  sacubitril -valsartan  (ENTRESTO ) 49-51 MG Take 1 tablet by mouth 2 (two) times daily. 05/06/23   Newlin, Enobong, MD  solifenacin  (VESICARE ) 10 MG tablet Take 1 tablet (10 mg total) by mouth daily. 07/05/23   Zuleta, Kaitlin G, NP  spironolactone  (ALDACTONE ) 25 MG tablet Take 1 tablet (25 mg total) by mouth daily. 11/17/22   Newlin, Enobong, MD  venlafaxine  XR (EFFEXOR  XR) 75 MG 24 hr capsule Take 1 capsule (75 mg total) by mouth daily with breakfast. For headaches 02/23/23   Newlin, Enobong, MD  vitamin B-12 (CYANOCOBALAMIN) 1000 MCG tablet Take 3,000 mcg  by mouth daily.    [provider]  Vitamin D, Ergocalciferol, 50000 units CAPS 1 capsule.    [provider]    Allergies: Acetaminophen , Metformin , Propoxyphene, Aspirin , Darvocet [propoxyphene n-acetaminophen ], Erythromycin, Gabapentin, Ibuprofen, and Metformin  and related    Review of Systems  Gastrointestinal:  Positive for abdominal pain.  Neurological:  Positive for weakness.    Updated Vital Signs BP 126/70   Pulse 94   Temp 98 F (36.7 C)   Resp 18   Ht 5' 4 (1.626 m)   Wt 43.1 kg   LMP 11/16/2014 (Approximate)   SpO2 100%   BMI 16.31 kg/m   Physical Exam Vitals and nursing Brooke [name] reviewed.  Constitutional:      General: She is not in acute distress.    Appearance: She is well-developed. She is not diaphoretic.  HENT:     Head: Normocephalic and atraumatic.  Eyes:     Pupils: Pupils are equal, round, and reactive to light.  Cardiovascular:     Rate and Rhythm: Normal rate and regular rhythm.     Heart sounds: No murmur heard.    No friction rub. No gallop.  Pulmonary:     Effort: Pulmonary effort is normal.     Breath sounds: No wheezing or rales.  Abdominal:     General: There is no distension.     Palpations: Abdomen is soft.     Tenderness: There is abdominal tenderness (diffuse pain on exam).  Musculoskeletal:        General: No tenderness.     Cervical back: Normal range of motion and neck supple.  Skin:    General: Skin is warm and dry.  Neurological:     Mental Status: She is alert and oriented to person, place, and time.  Psychiatric:        Behavior: Behavior normal.     (all labs ordered are listed, but only abnormal results are displayed) Labs Reviewed  COMPREHENSIVE METABOLIC PANEL WITH GFR - Abnormal; Notable for the following components:      Result Value   Sodium 129 (*)    Chloride 88 (*)    Glucose, Bld 295 (*)    Creatinine, Ser 1.68 (*)    Total Protein 8.4 (*)    Albumin 2.5 (*)    AST 12 (*)    GFR,  Estimated 34 (*)    All other components within normal limits  CBC WITH DIFFERENTIAL/PLATELET - Abnormal; Notable for the following components:   WBC 13.9 (*)    RBC 3.73 (*)    Hemoglobin 9.5 (*)    HCT 29.4 (*)    MCV 78.8 (*)    MCH 25.5 (*)    Platelets 444 (*)    Neutro Abs 10.5 (*)    All other components within normal limits  PROTIME-INR - Abnormal; Notable for the following  components:   Prothrombin Time 15.7 (*)    All other components within normal limits  I-STAT CHEM 8, ED - Abnormal; Notable for the following components:   Sodium 131 (*)    Chloride 90 (*)    Creatinine, Ser 1.60 (*)    Glucose, Bld 295 (*)    Calcium , Ion 1.13 (*)    Hemoglobin 10.2 (*)    HCT 30.0 (*)    All other components within normal limits  CULTURE, BLOOD (ROUTINE X 2)  CULTURE, BLOOD (ROUTINE X 2)  URINALYSIS, W/ REFLEX TO CULTURE (INFECTION SUSPECTED)  I-STAT CG4 LACTIC ACID, ED  I-STAT CG4 LACTIC ACID, ED    EKG: None  Radiology: DG Chest 2 View if patient is not in a treatment room. Result Date: 03/02/2024 CLINICAL DATA:  Suspected sepsis EXAM: CHEST - 2 VIEW COMPARISON:  12/26/2023 FINDINGS: The heart size and mediastinal contours are within normal limits. Both lungs are clear. The visualized skeletal structures are unremarkable. IMPRESSION: No active cardiopulmonary disease. Electronically Signed   By: Franky Crease M.D.   On: 03/02/2024 14:16     Procedures   Medications Ordered in the ED  lactated ringers  bolus 1,000 mL (0 mLs Intravenous Stopped 03/02/24 1502)  metoCLOPramide  (REGLAN ) injection 10 mg (10 mg Intravenous Given 03/02/24 1458)  diphenhydrAMINE  (BENADRYL ) injection 25 mg (25 mg Intravenous Given 03/02/24 1458)  sodium chloride  0.9 % bolus 1,000 mL (1,000 mLs Intravenous New Bag/Given 03/02/24 1502)  morphine  (PF) 4 MG/ML injection 4 mg (4 mg Intravenous Given 03/02/24 1458)                                    Medical Decision Making Amount and/or Complexity of  Data Reviewed Labs: ordered. Radiology: ordered.  Risk Prescription drug management.   63 yo F with a cc of abdominal pain, difficulty eating and drinking. Going on for weeks off an on.  Patient with diffuse abdominal pain, AKI, give fluids.  CT, reassess.  Blood work with acute kidney injury.  Hyponatremia hypochloremia.  Will give IV fluids here.  Awaiting CT. UA.  Patient care was signed out to Dr. Darra, please see their Brooke [name] for further details care in the ED.  The patients results and plan were reviewed and discussed.   Any x-rays performed were independently reviewed by myself.   Differential diagnosis were considered with the presenting HPI.  Medications  lactated ringers  bolus 1,000 mL (0 mLs Intravenous Stopped 03/02/24 1502)  metoCLOPramide  (REGLAN ) injection 10 mg (10 mg Intravenous Given 03/02/24 1458)  diphenhydrAMINE  (BENADRYL ) injection 25 mg (25 mg Intravenous Given 03/02/24 1458)  sodium chloride  0.9 % bolus 1,000 mL (1,000 mLs Intravenous New Bag/Given 03/02/24 1502)  morphine  (PF) 4 MG/ML injection 4 mg (4 mg Intravenous Given 03/02/24 1458)    Vitals:   03/02/24 1229 03/02/24 1241 03/02/24 1303 03/02/24 1450  BP: (!) 89/58 (!) 91/58  126/70  Pulse: 100   94  Resp: 16   18  Temp: 98 F (36.7 C)     SpO2: 96%   100%  Weight:   43.1 kg   Height:   5' 4 (1.626 m)     Final diagnoses:  Generalized abdominal pain  AKI (acute kidney injury) (HCC)         Final diagnoses:  Generalized abdominal pain  AKI (acute kidney injury) [facility])    ED Discharge Orders  None          Emil Share, DO 03/02/24 1513

## 2024-03-02 NOTE — H&P (Signed)
 History and Physical    Patient: Brooke [name] DOB: Jan 04, 1961 DOA: 03/02/2024 DOS: the patient was seen and examined on 03/02/2024 PCP: [name], MD  Patient coming from: {Point_of_Origin:[address]}  Chief Complaint:  Chief Complaint  Patient presents with   Abdominal Pain   Weakness   Failure To Thrive   HPI: Brooke [name] is a 63 y.o. female with medical history significant of ***  Review of Systems: {ROS_Text:26778} Past Medical History:  Diagnosis Date   Abdominal pain, left lower quadrant 12/17/2015   Anxiety 07/01/2016   Carotid artery disease (HCC) 10/27/2022   Chest pain, unspecified 10/05/2003   CHF (congestive heart failure) (HCC)    Chronic systolic heart failure (HCC) 02/20/2022   Depression, major, recurrent, moderate (HCC) 07/31/2005   Diabetes mellitus without complication (HCC) 09/08/2006   Endometriosis 03/28/1996   History of colonic polyps 08/07/2015   20 mm removed 05/10/15 by Dr. Kristie     Hypertension 11/23/2003   PAD (peripheral artery disease) (HCC)    Unspecified constipation 06/24/2004   Past Surgical History:  Procedure Laterality Date   ABDOMINAL AORTOGRAM W/LOWER EXTREMITY Left 07/29/2023   Procedure: ABDOMINAL AORTOGRAM W/LOWER EXTREMITY;  Surgeon: Gretta Lonni PARAS, MD;  Location: [facility];  Service: Cardiovascular;  Laterality: Left;   APPENDECTOMY  1996   Dr. Buford   BREAST SURGERY Bilateral 1996   Dr. Ardeen   ESOPHAGEAL MANOMETRY N/A 03/23/2023   Procedure: ESOPHAGEAL MANOMETRY (EM);  Surgeon: Leigh Elspeth SQUIBB, MD;  Location: [facility];  Service: Gastroenterology;  Laterality: N/A;   INGUINAL HERNIA REPAIR     OVARIAN CYST REMOVAL     PERIPHERAL VASCULAR BALLOON ANGIOPLASTY Left 07/29/2023   Procedure: PERIPHERAL VASCULAR BALLOON ANGIOPLASTY;  Surgeon: Gretta Lonni PARAS, MD;  Location: [facility];  Service: Cardiovascular;  Laterality: Left;   RIGHT/LEFT HEART CATH AND CORONARY  ANGIOGRAPHY N/A 10/09/2021   Procedure: RIGHT/LEFT HEART CATH AND CORONARY ANGIOGRAPHY;  Surgeon: Wendel Lurena POUR, MD;  Location: [facility];  Service: Cardiovascular;  Laterality: N/A;   TOOTH EXTRACTION N/A 07/28/2022   Procedure: EXTRACTION ALL REMAING TEETH THREE, FOUR, FIVE, SIX, SEVEN, EIGHT, NINE, TEN, ELEVEN, TWELVE, TWENTY ONE, TWENTY TWO, TWENTY THREE, TWENTY FOUR, TWENTY FIVE, TWENTY SIX, TWENTY SEVEN, TWENTY EIGHT, TWENTY NINE, THIRTY, AVEOLOPLASTY;  Surgeon: Sheryle Hamilton, DMD;  Location: [facility] OR;  Service: Oral Surgery;  Laterality: N/A;   Social History:  reports that she has never smoked. She has never used smokeless tobacco. She reports that she does not drink alcohol and does not use drugs.  Allergies  Allergen Reactions   Acetaminophen     Metformin     Propoxyphene     Other Reaction(s): break-out   Aspirin  Other (See Comments)    Intolerance (aspirin  sensitive stomach)  Other Reaction(s): stomach upset   Darvocet [Propoxyphene N-Acetaminophen ] Rash    Skin reaction on Vaginal area   Erythromycin Nausea And Vomiting   Gabapentin Other (See Comments)    Suicidal ideations    Ibuprofen Other (See Comments)    Stomach issues   Metformin  And Related Nausea Only    Family History  Problem Relation Age of Onset   Diabetes Mother    Hypertension Mother    Arthritis Mother        RA   Pancreatic cancer Father    Stomach cancer Neg Hx    Colon cancer Neg Hx    Esophageal cancer Neg Hx    Rectal cancer Neg Hx  Prior to Admission medications   Medication Sig Start Date End Date Taking? Authorizing Provider  clopidogrel  (PLAVIX ) 75 MG tablet Take 1 tablet (75 mg total) by mouth daily. 02/23/23  Yes Newlin, Enobong, MD  aspirin  81 MG chewable tablet Chew 1 tablet (81 mg total) by mouth daily. 12/15/21   Bensimhon, Toribio SAUNDERS, MD  atorvastatin  (LIPITOR ) 80 MG tablet Take 1 tablet (80 mg total) by mouth daily. Please schedule PCP appointment. 11/17/23   Newlin,  Enobong, MD  conjugated estrogens  (PREMARIN ) vaginal cream Insert 0.5g vaginally nightly for two weeks, then twice a week after 12/31/22   Zuleta, Kaitlin G, NP  Continuous Blood Gluc Receiver (DEXCOM G7 RECEIVER) DEVI Use to check blood sugar 3 times daily. 06/02/22   Newlin, Enobong, MD  Continuous Glucose Sensor (DEXCOM G7 SENSOR) MISC Use to check blood sugar 3 times daily. 12/11/22   Newlin, Enobong, MD  dapagliflozin  propanediol (FARXIGA ) 10 MG TABS tablet Take 1 tablet (10 mg total) by mouth daily. Patient not taking: Reported on 01/14/2024 04/15/23   Newlin, Enobong, MD  diazepam  (VALIUM ) 5 MG tablet Insert 1 tablet vaginally nightly as needed for muscle spasm/ pelvic pain. 12/30/22   Zuleta, Kaitlin G, NP  glyBURIDE -metformin  (GLUCOVANCE ) 2.5-500 MG tablet 1 tablet with a meal Orally Twice a day for 30 day(s) 09/13/12   [provider]  Insulin  Lispro Prot & Lispro (HUMALOG  MIX 75/25 KWIKPEN) (75-25) 100 UNIT/ML Kwikpen Inject 32 Units into the skin in the morning AND 16 Units every evening. 02/23/23   Newlin, Enobong, MD  Insulin  Pen Needle (PENTIPS) 32G X 4 MM MISC Use as directed with insulin  pen 11/17/22   Newlin, Enobong, MD  loperamide  (IMODIUM  A-D) 2 MG tablet Take 1 tablet (2 mg total) by mouth as needed for diarrhea or loose stools. 02/09/23   Armbruster, Elspeth SQUIBB, MD  metoprolol  succinate (TOPROL -XL) 25 MG 24 hr tablet Take 1 tablet (25 mg total) by mouth daily. Take with or immediately following a meal. 10/11/23   Swinyer, Rosaline HERO, NP  Multiple Vitamin (MULTIVITAMIN WITH MINERALS) TABS tablet Take 1 tablet by mouth daily.    [provider]  mupirocin  ointment (BACTROBAN ) 2 % Apply 1 Application topically 2 (two) times daily. Patient not taking: Reported on 01/14/2024 11/25/23   Silva Juliene SAUNDERS, DPM  pantoprazole  (PROTONIX ) 20 MG tablet Take 1 tablet (20 mg total) by mouth daily. 08/17/23   Armbruster, Elspeth SQUIBB, MD  phenazopyridine  (PYRIDIUM ) 100 MG tablet Take 1 tablet (100 mg  total) by mouth 3 (three) times daily as needed (urinary pain). 01/14/24   Vonna Sharlet POUR, MD  phenazopyridine  (PYRIDIUM ) 100 MG tablet Take 1 tablet (100 mg total) by mouth 3 (three) times daily as needed (urinary pain). 01/14/24   Vonna Sharlet POUR, MD  sacubitril -valsartan  (ENTRESTO ) 49-51 MG Take 1 tablet by mouth 2 (two) times daily. 05/06/23   Newlin, Enobong, MD  solifenacin  (VESICARE ) 10 MG tablet Take 1 tablet (10 mg total) by mouth daily. 07/05/23   Zuleta, Kaitlin G, NP  spironolactone  (ALDACTONE ) 25 MG tablet Take 1 tablet (25 mg total) by mouth daily. 11/17/22   Newlin, Enobong, MD  venlafaxine  XR (EFFEXOR  XR) 75 MG 24 hr capsule Take 1 capsule (75 mg total) by mouth daily with breakfast. For headaches 02/23/23   Newlin, Enobong, MD  vitamin B-12 (CYANOCOBALAMIN) 1000 MCG tablet Take 3,000 mcg by mouth daily.    [provider]  Vitamin D, Ergocalciferol, 50000 units CAPS 1 capsule.  [provider]    Physical Exam: Vitals:   03/02/24 1450 03/02/24 1648 03/02/24 1752 03/02/24 1856  BP: 126/70  117/69 123/67  Pulse: 94  86 85  Resp: 18  18   Temp:  98.4 F (36.9 C)  98.6 F (37 C)  TempSrc:  Oral  Oral  SpO2: 100%  100% 98%  Weight:      Height:       *** Data Reviewed: {Tip this will not be part of the note when signed- Document your independent interpretation of telemetry tracing, EKG, lab, Radiology test or any other diagnostic tests. Add any new diagnostic test ordered today. (Optional):26781} {Results:26384}  Assessment and Plan: No notes have been filed under this hospital service. Service: Hospitalist     Advance Care Planning:   Code Status: Full Code ***  Consults: ***  Family Communication: ***  Severity of Illness: {Observation/Inpatient:21159}  AuthorBETHA SIM KNOLL, MD 03/02/2024 7:52 PM  For on call review [email or web address].

## 2024-03-02 NOTE — Consult Note (Signed)
 Urology Consult   Physician requesting consult: Rolan Quale, DO  Reason for consult: Right hydronephrosis, AKI, UTI  History of Present Illness: Brooke [name] is a 63 y.o. with a past medical history significant for diabetes, hypertension, depression, hyperlipidemia, CHF, anxiety presented the ED today with complaints of lower abdominal pain, difficulty eating, nausea, fatigue, malaise. She states she has been treated outpatient for a UTI with two rounds of antibiotics. She was found to have diffuse abdominal pain, AKI with creatinine 1.6 from baseline 0.6.  She noted subjective chills at home and found to be afebrile in the ED. She had a leukocytosis. Urinalysis revealed turbid urine with large leukocyte esterase.   CT A/P 03/02/2024 revealed moderate right hydroureteronephrosis with marked right hydroureter down the level of the right UVJ new compared to prior CT in 2022.  She had diffuse bladder wall thickening.  She denies history of urolithiasis, UTIs, gross hematuria. She denies prior urology history.  Past Medical History:  Diagnosis Date   Abdominal pain, left lower quadrant 12/17/2015   Anxiety 07/01/2016   Carotid artery disease (HCC) 10/27/2022   Chest pain, unspecified 10/05/2003   CHF (congestive heart failure) (HCC)    Chronic systolic heart failure (HCC) 02/20/2022   Depression, major, recurrent, moderate (HCC) 07/31/2005   Diabetes mellitus without complication (HCC) 09/08/2006   Endometriosis 03/28/1996   History of colonic polyps 08/07/2015   20 mm removed 05/10/15 by Dr. Kristie     Hypertension 11/23/2003   PAD (peripheral artery disease) (HCC)    Unspecified constipation 06/24/2004    Past Surgical History:  Procedure Laterality Date   ABDOMINAL AORTOGRAM W/LOWER EXTREMITY Left 07/29/2023   Procedure: ABDOMINAL AORTOGRAM W/LOWER EXTREMITY;  Surgeon: Gretta Lonni PARAS, MD;  Location: [facility] INVASIVE CV LAB;  Service: Cardiovascular;  Laterality: Left;   APPENDECTOMY   1996   Dr. Buford   BREAST SURGERY Bilateral 1996   Dr. Ardeen   ESOPHAGEAL MANOMETRY N/A 03/23/2023   Procedure: ESOPHAGEAL MANOMETRY (EM);  Surgeon: Leigh Elspeth SQUIBB, MD;  Location: [facility] ENDOSCOPY;  Service: Gastroenterology;  Laterality: N/A;   INGUINAL HERNIA REPAIR     OVARIAN CYST REMOVAL     PERIPHERAL VASCULAR BALLOON ANGIOPLASTY Left 07/29/2023   Procedure: PERIPHERAL VASCULAR BALLOON ANGIOPLASTY;  Surgeon: Gretta Lonni PARAS, MD;  Location: [facility] INVASIVE CV LAB;  Service: Cardiovascular;  Laterality: Left;   RIGHT/LEFT HEART CATH AND CORONARY ANGIOGRAPHY N/A 10/09/2021   Procedure: RIGHT/LEFT HEART CATH AND CORONARY ANGIOGRAPHY;  Surgeon: Wendel Lurena POUR, MD;  Location: [facility] INVASIVE CV LAB;  Service: Cardiovascular;  Laterality: N/A;   TOOTH EXTRACTION N/A 07/28/2022   Procedure: EXTRACTION ALL REMAING TEETH THREE, FOUR, FIVE, SIX, SEVEN, EIGHT, NINE, TEN, ELEVEN, TWELVE, TWENTY ONE, TWENTY TWO, TWENTY THREE, TWENTY FOUR, TWENTY FIVE, TWENTY SIX, TWENTY SEVEN, TWENTY EIGHT, TWENTY NINE, THIRTY, AVEOLOPLASTY;  Surgeon: Sheryle Hamilton, DMD;  Location: [facility] OR;  Service: Oral Surgery;  Laterality: N/A;     Current Hospital Medications:  Home meds:  No current facility-administered medications on file prior to encounter.   Current Outpatient Medications on File Prior to Encounter  Medication Sig Dispense Refill   aspirin  81 MG chewable tablet Chew 1 tablet (81 mg total) by mouth daily. 30 tablet 1   atorvastatin  (LIPITOR ) 80 MG tablet Take 1 tablet (80 mg total) by mouth daily. Please schedule PCP appointment. 30 tablet 0   clopidogrel  (PLAVIX ) 75 MG tablet Take 1 tablet (75 mg total) by mouth daily. 90 tablet 1   conjugated estrogens  (PREMARIN )  vaginal cream Insert 0.5g vaginally nightly for two weeks, then twice a week after 42.5 g 11   Continuous Blood Gluc Receiver (DEXCOM G7 RECEIVER) DEVI Use to check blood sugar 3 times daily. 1 each 0   Continuous Glucose Sensor (DEXCOM G7  SENSOR) MISC Use to check blood sugar 3 times daily. 3 each 6   dapagliflozin  propanediol (FARXIGA ) 10 MG TABS tablet Take 1 tablet (10 mg total) by mouth daily. (Patient not taking: Reported on 01/14/2024) 30 tablet 3   diazepam  (VALIUM ) 5 MG tablet Insert 1 tablet vaginally nightly as needed for muscle spasm/ pelvic pain. 15 tablet 1   glyBURIDE -metformin  (GLUCOVANCE ) 2.5-500 MG tablet 1 tablet with a meal Orally Twice a day for 30 day(s)     Insulin  Lispro Prot & Lispro (HUMALOG  MIX 75/25 KWIKPEN) (75-25) 100 UNIT/ML Kwikpen Inject 32 Units into the skin in the morning AND 16 Units every evening. 30 mL 2   Insulin  Pen Needle (PENTIPS) 32G X 4 MM MISC Use as directed with insulin  pen 100 each 0   loperamide  (IMODIUM  A-D) 2 MG tablet Take 1 tablet (2 mg total) by mouth as needed for diarrhea or loose stools. 30 tablet 0   metoprolol  succinate (TOPROL -XL) 25 MG 24 hr tablet Take 1 tablet (25 mg total) by mouth daily. Take with or immediately following a meal. 90 tablet 3   Multiple Vitamin (MULTIVITAMIN WITH MINERALS) TABS tablet Take 1 tablet by mouth daily.     mupirocin  ointment (BACTROBAN ) 2 % Apply 1 Application topically 2 (two) times daily. (Patient not taking: Reported on 01/14/2024) 30 g 2   pantoprazole  (PROTONIX ) 20 MG tablet Take 1 tablet (20 mg total) by mouth daily. 30 tablet 3   phenazopyridine  (PYRIDIUM ) 100 MG tablet Take 1 tablet (100 mg total) by mouth 3 (three) times daily as needed (urinary pain). 10 tablet 0   phenazopyridine  (PYRIDIUM ) 100 MG tablet Take 1 tablet (100 mg total) by mouth 3 (three) times daily as needed (urinary pain). 10 tablet 0   sacubitril -valsartan  (ENTRESTO ) 49-51 MG Take 1 tablet by mouth 2 (two) times daily. 180 tablet 3   solifenacin  (VESICARE ) 10 MG tablet Take 1 tablet (10 mg total) by mouth daily. 90 tablet 3   spironolactone  (ALDACTONE ) 25 MG tablet Take 1 tablet (25 mg total) by mouth daily. 90 tablet 1   venlafaxine  XR (EFFEXOR  XR) 75 MG 24 hr capsule  Take 1 capsule (75 mg total) by mouth daily with breakfast. For headaches 30 capsule 3   vitamin B-12 (CYANOCOBALAMIN) 1000 MCG tablet Take 3,000 mcg by mouth daily.     Vitamin D, Ergocalciferol, 50000 units CAPS 1 capsule.       Scheduled Meds: Continuous Infusions:  cefTRIAXone  (ROCEPHIN )  IV 1 g (03/02/24 1800)   PRN Meds:.  Allergies:  Allergies  Allergen Reactions   Acetaminophen     Metformin     Propoxyphene     Other Reaction(s): break-out   Aspirin  Other (See Comments)    Intolerance (aspirin  sensitive stomach)  Other Reaction(s): stomach upset   Darvocet [Propoxyphene N-Acetaminophen ] Rash    Skin reaction on Vaginal area   Erythromycin Nausea And Vomiting   Gabapentin Other (See Comments)    Suicidal ideations    Ibuprofen Other (See Comments)    Stomach issues   Metformin  And Related Nausea Only    Family History  Problem Relation Age of Onset   Diabetes Mother    Hypertension Mother    Arthritis Mother  RA   Pancreatic cancer Father    Stomach cancer Neg Hx    Colon cancer Neg Hx    Esophageal cancer Neg Hx    Rectal cancer Neg Hx     Social History:  reports that she has never smoked. She has never used smokeless tobacco. She reports that she does not drink alcohol and does not use drugs.  ROS: A complete review of systems was performed.  All systems are negative except for pertinent findings as noted.  Physical Exam:  Vital signs in last 24 hours: Temp:  [98 F (36.7 C)-98.4 F (36.9 C)] 98.4 F (36.9 C) (07/24 1648) Pulse Rate:  [86-100] 86 (07/24 1752) Resp:  [16-18] 18 (07/24 1752) BP: (89-126)/(58-70) 117/69 (07/24 1752) SpO2:  [96 %-100 %] 100 % (07/24 1752) Weight:  [43.1 kg] 43.1 kg (07/24 1303) Constitutional:  Alert and oriented, No acute distress Cardiovascular: Regular rate and rhythm Respiratory: Normal respiratory effort, Lungs clear bilaterally GI: Abdomen is soft, nontender, nondistended, no abdominal masses GU: No  CVA tenderness Neurologic: Grossly intact, no focal deficits Psychiatric: Normal mood and affect  Laboratory Data:  Recent Labs    03/02/24 1316 03/02/24 1334  WBC 13.9*  --   HGB 9.5* 10.2*  HCT 29.4* 30.0*  PLT 444*  --     Recent Labs    03/02/24 1316 03/02/24 1334  NA 129* 131*  K 3.6 3.7  CL 88* 90*  GLUCOSE 295* 295*  BUN 16 17  CALCIUM  8.9  --   CREATININE 1.68* 1.60*     Results for orders placed or performed during the hospital encounter of 03/02/24 (from the past 24 hours)  Urinalysis, w/ Reflex to Culture (Infection Suspected) -Urine, Clean Catch     Status: Abnormal   Collection Time: 03/02/24  1:11 PM  Result Value Ref Range   Specimen Source URINE, CATHETERIZED    Color, Urine YELLOW YELLOW   APPearance TURBID (A) CLEAR   Specific Gravity, Urine 1.011 1.005 - 1.030   pH 5.0 5.0 - 8.0   Glucose, UA 150 (A) NEGATIVE mg/dL   Hgb urine dipstick MODERATE (A) NEGATIVE   Bilirubin Urine NEGATIVE NEGATIVE   Ketones, ur NEGATIVE NEGATIVE mg/dL   Protein, ur 30 (A) NEGATIVE mg/dL   Nitrite NEGATIVE NEGATIVE   Leukocytes,Ua LARGE (A) NEGATIVE   RBC / HPF 11-20 0 - 5 RBC/hpf   WBC, UA >50 0 - 5 WBC/hpf   Bacteria, UA MANY (A) NONE SEEN   Squamous Epithelial / HPF 0-5 0 - 5 /HPF   Mucus PRESENT    Budding Yeast PRESENT   Comprehensive metabolic panel     Status: Abnormal   Collection Time: 03/02/24  1:16 PM  Result Value Ref Range   Sodium 129 (L) 135 - 145 mmol/L   Potassium 3.6 3.5 - 5.1 mmol/L   Chloride 88 (L) 98 - 111 mmol/L   CO2 26 22 - 32 mmol/L   Glucose, Bld 295 (H) 70 - 99 mg/dL   BUN 16 8 - 23 mg/dL   Creatinine, Ser 8.31 (H) 0.44 - 1.00 mg/dL   Calcium  8.9 8.9 - 10.3 mg/dL   Total Protein 8.4 (H) 6.5 - 8.1 g/dL   Albumin 2.5 (L) 3.5 - 5.0 g/dL   AST 12 (L) 15 - 41 U/L   ALT 8 0 - 44 U/L   Alkaline Phosphatase 94 38 - 126 U/L   Total Bilirubin 1.0 0.0 - 1.2 mg/dL   GFR, Estimated  34 (L) >60 mL/min   Anion gap 15 5 - 15  CBC with  Differential     Status: Abnormal   Collection Time: 03/02/24  1:16 PM  Result Value Ref Range   WBC 13.9 (H) 4.0 - 10.5 K/uL   RBC 3.73 (L) 3.87 - 5.11 MIL/uL   Hemoglobin 9.5 (L) 12.0 - 15.0 g/dL   HCT 70.5 (L) 63.9 - 53.9 %   MCV 78.8 (L) 80.0 - 100.0 fL   MCH 25.5 (L) 26.0 - 34.0 pg   MCHC 32.3 30.0 - 36.0 g/dL   RDW 86.7 88.4 - 84.4 %   Platelets 444 (H) 150 - 400 K/uL   nRBC 0.0 0.0 - 0.2 %   Neutrophils Relative % 76 %   Neutro Abs 10.5 (H) 1.7 - 7.7 K/uL   Lymphocytes Relative 16 %   Lymphs Abs 2.2 0.7 - 4.0 K/uL   Monocytes Relative 7 %   Monocytes Absolute 0.9 0.1 - 1.0 K/uL   Eosinophils Relative 0 %   Eosinophils Absolute 0.0 0.0 - 0.5 K/uL   Basophils Relative 0 %   Basophils Absolute 0.0 0.0 - 0.1 K/uL   Immature Granulocytes 1 %   Abs Immature Granulocytes 0.07 0.00 - 0.07 K/uL  Protime-INR     Status: Abnormal   Collection Time: 03/02/24  1:16 PM  Result Value Ref Range   Prothrombin Time 15.7 (H) 11.4 - 15.2 seconds   INR 1.2 0.8 - 1.2  I-Stat Lactic Acid, ED     Status: None   Collection Time: 03/02/24  1:30 PM  Result Value Ref Range   Lactic Acid, Venous 1.5 0.5 - 1.9 mmol/L  I-stat chem 8, ED (not at [facility], [facility] or [facility])     Status: Abnormal   Collection Time: 03/02/24  1:34 PM  Result Value Ref Range   Sodium 131 (L) 135 - 145 mmol/L   Potassium 3.7 3.5 - 5.1 mmol/L   Chloride 90 (L) 98 - 111 mmol/L   BUN 17 8 - 23 mg/dL   Creatinine, Ser 8.39 (H) 0.44 - 1.00 mg/dL   Glucose, Bld 704 (H) 70 - 99 mg/dL   Calcium , Ion 1.13 (L) 1.15 - 1.40 mmol/L   TCO2 27 22 - 32 mmol/L   Hemoglobin 10.2 (L) 12.0 - 15.0 g/dL   HCT 69.9 (L) 63.9 - 53.9 %  I-Stat Lactic Acid, ED     Status: None   Collection Time: 03/02/24  3:21 PM  Result Value Ref Range   Lactic Acid, Venous 1.6 0.5 - 1.9 mmol/L   No results found for this or any previous visit (from the past 240 hours).  Renal Function: Recent Labs    03/02/24 1316 03/02/24 1334  CREATININE 1.68* 1.60*    Estimated Creatinine Clearance: 25.1 mL/min (A) (by C-G formula based on SCr of 1.6 mg/dL (H)).  Radiologic Imaging: CT ABDOMEN PELVIS W CONTRAST Result Date: 03/02/2024 CLINICAL DATA:  Abdomen pain EXAM: CT ABDOMEN AND PELVIS WITH CONTRAST TECHNIQUE: Multidetector CT imaging of the abdomen and pelvis was performed using the standard protocol following bolus administration of intravenous contrast. RADIATION DOSE REDUCTION: This exam was performed according to the departmental dose-optimization program which includes automated exposure control, adjustment of the mA and/or kV according to patient size and/or use of iterative reconstruction technique. CONTRAST:  40mL OMNIPAQUE  IOHEXOL  350 MG/ML SOLN COMPARISON:  CT 11/07/2020 FINDINGS: Lower chest: Lung bases demonstrate no acute airspace disease. Hepatobiliary: No focal liver abnormality is seen.  No gallstones, gallbladder wall thickening, or biliary dilatation. Pancreas: Unremarkable. No pancreatic ductal dilatation or surrounding inflammatory changes. Spleen: Normal in size without focal abnormality. Adrenals/Urinary Tract: Adrenal glands are normal. Bilateral intrarenal vascular calcifications. Moderate right hydronephrosis with marked hydroureter, down to the level of the UVJ. No obstructing stone. Slight soft tissue thickening at the right UVJ, series 3, image 69. Diffuse bladder wall thickening. Generalized decreased contrast excretion on delayed views consistent with decreased renal function but worse on the right side. Mild perinephric fat stranding on the right. Stomach/Bowel: Stomach nonenlarged. No dilated small bowel. No acute bowel wall thickening. Diverticular disease of the sigmoid colon Vascular/Lymphatic: Aortic atherosclerosis. No enlarged abdominal or pelvic lymph nodes. Reproductive: Uterus and bilateral adnexa are unremarkable. Other: Negative for free air.  Trace volume free fluid in the pelvis Musculoskeletal: No acute or suspicious  osseous abnormality IMPRESSION: 1. Moderate right hydronephrosis with marked hydroureter, down to the level of the UVJ, new compared to prior CT. No obstructing stone. Slight soft tissue thickening at the right UVJ, with overall diffuse bladder wall thickening. Mild asymmetrical right perinephric stranding. Correlate with urinalysis to assess for potential UTI and ascending urinary tract infection, obstructing process at the right UVJ not excluded and consider urology consultation. 2. Generalized decreased contrast excretion on delayed views consistent with decreased renal function but worse on the right side. 3. Diverticular disease of the sigmoid colon without acute inflammatory process. 4. Aortic atherosclerosis. Aortic Atherosclerosis (ICD10-I70.0). Electronically Signed   By: Luke Bun M.D.   On: 03/02/2024 16:30   DG Chest 2 View if patient is not in a treatment room. Result Date: 03/02/2024 CLINICAL DATA:  Suspected sepsis EXAM: CHEST - 2 VIEW COMPARISON:  12/26/2023 FINDINGS: The heart size and mediastinal contours are within normal limits. Both lungs are clear. The visualized skeletal structures are unremarkable. IMPRESSION: No active cardiopulmonary disease. Electronically Signed   By: Franky Crease M.D.   On: 03/02/2024 14:16    I independently reviewed the above imaging studies.  Impression/Recommendation: Right hydroureteronephrosis with sign of infection  - Reviewed CT imaging with marked right hydroureter to the level of the bladder with diffuse bladder wall thickening.  She has signs of infection with malaise, AKI.  Discussed she would likely benefit from maximal urinary decompression with right ureteral stent placement and Foley catheter.  Discussed after infection resolves, would arrange for possible ureteroscopy, possible biopsy, further evaluation for etiology of hydronephrosis.  -The risks, benefits and alternatives of cystoscopy with right JJ stent placement was discussed with  the patient.  Risks include, but are not limited to: bleeding, urinary tract infection, ureteral injury, ureteral stricture disease, chronic pain, urinary symptoms, bladder injury, stent migration, the need for nephrostomy tube placement, MI, CVA, DVT, PE and the inherent risks with general anesthesia.  The patient voices understanding and wishes to proceed.    Matt R. Paola Flynt MD 03/02/2024, 6:26 PM  [facility]  Pager: [phone]   CC: Rolan Quale, DO

## 2024-03-02 NOTE — ED Provider Triage Note (Signed)
 Emergency Medicine Provider Triage Evaluation Note  Brooke [name] , a 63 y.o. female  was evaluated in triage.  Pt complains of continued upper abdominal pain, poor p.o. intake, history of thick tarry stools.  She has a history of type 2 diabetes, hypertension, depression, recurrent UTI.  Most recently evaluated in this ED on 18 May, has taken both PPI and famotidine  for her abdominal pain, follows [facility] for GERD.  Further manage for recurrent UTI with most recent antibiotic course started on 6 June and was provided with a course of Cipro  for this.  Initially had some improvement but continues now to have abdominal pain with poor oral intake of both solids and liquids, also feels generally weak.  Review of Systems  Positive: As above Negative:   Physical Exam  BP (!) 91/58 (BP Location: Right Arm)   Pulse 100   Temp 98 F (36.7 C)   Resp 16   Ht 5' 4 (1.626 m)   Wt 43.1 kg   LMP 11/16/2014 (Approximate)   SpO2 96%   BMI 16.31 kg/m  Gen:   Awake, no distress   Resp:  Normal effort  MSK:   Moves extremities without difficulty, no focal weakness appreciated Other:  Abdomen is diffusely tender, poor skin turgor is appreciated and dry mucosa of the mouth is appreciated  Medical Decision Making  Medically screening exam initiated at 1:19 PM.  Appropriate orders placed.  Brooke [name] was informed that the remainder of the evaluation will be completed by another provider, this initial triage assessment does not replace that evaluation, and the importance of remaining in the ED until their evaluation is complete.  Nursing initiated sepsis order set, will add on IV hydration.   Myriam Dorn BROCK[place], [place] 03/02/24 1323

## 2024-03-02 NOTE — ED Notes (Signed)
 Patient transported to CT

## 2024-03-02 NOTE — ED Notes (Signed)
 Assisted pt with bedpan

## 2024-03-02 NOTE — Anesthesia Procedure Notes (Signed)
 Procedure Name: LMA Insertion Date/Time: 03/02/2024 7:38 PM  Performed by: Mannie Krystal LABOR, CRNAPre-anesthesia Checklist: Patient identified, Emergency Drugs available, Suction available and Patient being monitored Patient Re-evaluated:Patient Re-evaluated prior to induction Oxygen Delivery Method: Circle system utilized Preoxygenation: Pre-oxygenation with 100% oxygen Induction Type: IV induction LMA: LMA inserted LMA Size: 4.0 Number of attempts: 1 Placement Confirmation: positive ETCO2 and breath sounds checked- equal and bilateral Tube secured with: Tape Dental Injury: Teeth and Oropharynx as per pre-operative assessment

## 2024-03-02 NOTE — ED Provider Notes (Signed)
 Blood pressure 126/70, pulse 94, temperature 98 F (36.7 C), resp. rate 18, height 5' 4 (1.626 m), weight 43.1 kg, last menstrual period 11/16/2014, SpO2 100%.  Assuming care from Dr. Emil.  In short, Brooke [name] is a 64 y.o. female with a chief complaint of Abdominal Pain, Weakness, and Failure To Thrive .  Refer to the original H&P for additional details.  The current plan of care is to follow CT and likely admit with AKI and poor PO intake.   05:25 PM  Discussed CT and urine results with the patient.  She has moderate right hydronephrosis down to the UVJ without obvious stone.  UA has many bacteria with large leukocytes and negative nitrite.  I have sent this for culture but she also has a leukocytosis.  Will start IV Rocephin  and will consult urology.  Anticipate admission with uptrending creatinine as well.   Spoke with Dr. Selma with Urology. Plan for stenting this evening and medicine admit. She can stay here at [facility].   TRH to admit.    Darra Fonda MATSU, MD 03/03/24 1027

## 2024-03-02 NOTE — Op Note (Signed)
 Operative Note  Preoperative diagnosis:  1.  Hydronephrosis 2. UTI  Postoperative diagnosis: 1.  Right hydronephrosis 2. UTI  Procedure(s): 1.  Cystoscopy 2. Urethral dilation 3. Bilateral retrograde pyelograms with interpretation 3. Right ureteral stent placement 4. Fluoroscopy <1 hour with intraoperative interpretation 5. Foley catheter placement  Surgeon: Donnice Siad, MD  Assistants:  None  Anesthesia:  General  Complications:  None  EBL: Minimal  Specimens: 1.  Right renal pelvis urine culture  Drains/Catheters: 1.  Right 6Fr x 24cm ureteral stent 2. 16 French Foley catheter  Intraoperative findings:   Cystoscopy demonstrated urethral stenosis that was dilated to accommodate 21 [place] scope, significant purulent urine with distended bladder, erythematous bladder throughout consistent with cystitis, initially very difficult to evaluate ureteral orifices bilaterally given significant purulent debris that was irrigated. Left retrograde pyelogram with no hydronephrosis, no filling defects and contrast drained promptly. Right retrograde pyelogram with severe right hydroureteronephrosis with torturous ureter with complete 360 degree loop in the proximal right ureter. Successful right ureteral stent placement with curl seen within the renal pelvis and bladder following case.  Indication:  Brooke [name] is a 63 y.o. female with history of prolonged UTI who presents today with malaise, chills, leukocytosis and CT scan demonstrating right hydroureteronephrosis to the level the bladder with thick-walled bladder.  After reviewing the management options for treatment, she elected to proceed with the above surgical procedure(s). We have discussed the potential benefits and risks of the procedure, side effects of the proposed treatment, the likelihood of the patient achieving the goals of the procedure, and any potential problems that might occur during the procedure or recuperation.  Informed consent has been obtained.  Description of procedure: The patient was taken to the operating room and general anesthesia was induced.  The patient was placed in the dorsal lithotomy position, prepped and draped in the usual sterile fashion, and preoperative antibiotics were administered. A preoperative time-out was performed.   I was initially unable to pass the scope into the urethra and thus urethra was dilated to 22 [place] to accommodate the scope. Cystourethroscopy was performed.  The patient's urethra was examined and was otherwise normal except for stenotic meatus.  Once in the bladder, she had purulent debris.  Her bladder was evacuated approximately 300 mm cloudy purulent appearing urine.  Bladder was examined and she was found to have erythema throughout entire bladder consistent with cystitis.  Bladder was redundant and thick-walled.  There was no evidence for any suspicious appearing masses.  Initially had difficulty identifying the ureteral orifices bilaterally given the purulent debris in the dependent portion of the bladder with that was evacuated.  Next, I performed left retrograde pyelogram demonstrating 5 as above with no hydronephrosis on the left.  Next, right retrograde pyelogram was performed by inserting 5 French opening cath and distal right ureter.  There is significant right hydroureteronephrosis to the level of the bladder.  There is significant tortuosity of the proximal ureter including 360 degree loop in the right proximal ureter.  I then passed a 0.038 sensor wire to level the renal pelvis.  5 French open cath was then advanced, wire was removed, right renal pelvis urine culture was obtained.  Sensor wire was replaced.  A 6Fr x 24 cm ureteral stent was advance over the wire. The stent was positioned appropriately under fluoroscopic and cystoscopic guidance.  The wire was then removed with an adequate stent curl noted in the renal pelvis as well as in the bladder.  16  [place]  Foley catheter was then placed.  The bladder was then emptied and the procedure ended.  The patient appeared to tolerate the procedure well and without complications.  The patient was able to be awakened and transferred to the recovery unit in satisfactory condition.   Plan: The Foley catheter in place department 24 hours and then void show, leave right ureteral stent in place.  Patient will also require follow-up with me to undergo diagnostic right ureteroscopy.  This could ultimately be reflux into her right ureter from chronically distended bladder.  Matt R. Ceferino Lang MD [facility]  Pager: [phone]

## 2024-03-03 ENCOUNTER — Telehealth (HOSPITAL_COMMUNITY): Payer: Self-pay | Admitting: Pharmacy Technician

## 2024-03-03 ENCOUNTER — Other Ambulatory Visit (HOSPITAL_COMMUNITY): Payer: Self-pay

## 2024-03-03 ENCOUNTER — Encounter (HOSPITAL_COMMUNITY): Payer: Self-pay | Admitting: Urology

## 2024-03-03 DIAGNOSIS — N179 Acute kidney failure, unspecified: Secondary | ICD-10-CM | POA: Diagnosis not present

## 2024-03-03 DIAGNOSIS — Q6211 Congenital occlusion of ureteropelvic junction: Secondary | ICD-10-CM | POA: Diagnosis not present

## 2024-03-03 DIAGNOSIS — N134 Hydroureter: Secondary | ICD-10-CM | POA: Diagnosis not present

## 2024-03-03 DIAGNOSIS — N390 Urinary tract infection, site not specified: Secondary | ICD-10-CM | POA: Diagnosis not present

## 2024-03-03 LAB — COMPREHENSIVE METABOLIC PANEL WITH GFR
ALT: 8 U/L (ref 0–44)
AST: 10 U/L — ABNORMAL LOW (ref 15–41)
Albumin: 2.2 g/dL — ABNORMAL LOW (ref 3.5–5.0)
Alkaline Phosphatase: 85 U/L (ref 38–126)
Anion gap: 15 (ref 5–15)
BUN: 16 mg/dL (ref 8–23)
CO2: 24 mmol/L (ref 22–32)
Calcium: 8.7 mg/dL — ABNORMAL LOW (ref 8.9–10.3)
Chloride: 95 mmol/L — ABNORMAL LOW (ref 98–111)
Creatinine, Ser: 1.24 mg/dL — ABNORMAL HIGH (ref 0.44–1.00)
GFR, Estimated: 50 mL/min — ABNORMAL LOW (ref >60)
Glucose, Bld: 351 mg/dL — ABNORMAL HIGH (ref 70–99)
Potassium: 4.5 mmol/L (ref 3.5–5.1)
Sodium: 134 mmol/L — ABNORMAL LOW (ref 135–145)
Total Bilirubin: 1 mg/dL (ref 0.0–1.2)
Total Protein: 7.3 g/dL (ref 6.5–8.1)

## 2024-03-03 LAB — GLUCOSE, CAPILLARY
Glucose-Capillary: 210 mg/dL — ABNORMAL HIGH (ref 70–99)
Glucose-Capillary: 390 mg/dL — ABNORMAL HIGH (ref 70–99)
Glucose-Capillary: 395 mg/dL — ABNORMAL HIGH (ref 70–99)
Glucose-Capillary: 317 mg/dL — ABNORMAL HIGH (ref 70–99)
Glucose-Capillary: 277 mg/dL — ABNORMAL HIGH (ref 70–99)

## 2024-03-03 LAB — CBC
HCT: 30.3 % — ABNORMAL LOW (ref 36.0–46.0)
Hemoglobin: 9.8 g/dL — ABNORMAL LOW (ref 12.0–15.0)
MCH: 25.5 pg — ABNORMAL LOW (ref 26.0–34.0)
MCHC: 32.3 g/dL (ref 30.0–36.0)
MCV: 78.9 fL — ABNORMAL LOW (ref 80.0–100.0)
Platelets: 449 K/uL — ABNORMAL HIGH (ref 150–400)
RBC: 3.84 MIL/uL — ABNORMAL LOW (ref 3.87–5.11)
RDW: 13.4 % (ref 11.5–15.5)
WBC: 11.9 K/uL — ABNORMAL HIGH (ref 4.0–10.5)
nRBC: 0 % (ref 0.0–0.2)

## 2024-03-03 LAB — HEMOGLOBIN A1C
Hgb A1c MFr Bld: 14.7 % — ABNORMAL HIGH (ref 4.8–5.6)
Mean Plasma Glucose: 375.19 mg/dL

## 2024-03-03 LAB — HIV ANTIBODY (ROUTINE TESTING W REFLEX): HIV Screen 4th Generation wRfx: NONREACTIVE

## 2024-03-03 MED ORDER — INSULIN GLARGINE-YFGN 100 UNIT/ML ~~LOC~~ SOLN
8.0000 [IU] | Freq: Every day | SUBCUTANEOUS | Status: DC
  Administered 2024-03-03: 8 [IU] via SUBCUTANEOUS
  Filled 2024-03-03: qty 0.08

## 2024-03-03 MED ORDER — CHLORHEXIDINE GLUCONATE CLOTH 2 % EX PADS
6.0000 | MEDICATED_PAD | Freq: Two times a day (BID) | CUTANEOUS | Status: DC
  Administered 2024-03-04: 6 via TOPICAL

## 2024-03-03 MED ORDER — ACETAMINOPHEN 325 MG PO TABS
650.0000 mg | ORAL_TABLET | Freq: Four times a day (QID) | ORAL | Status: DC | PRN
  Administered 2024-03-03 – 2024-03-04 (×2): 650 mg via ORAL
  Filled 2024-03-03 (×2): qty 2

## 2024-03-03 MED ORDER — INSULIN ASPART 100 UNIT/ML IJ SOLN
0.0000 [IU] | Freq: Three times a day (TID) | INTRAMUSCULAR | Status: DC
  Administered 2024-03-03: 15 [IU] via SUBCUTANEOUS
  Administered 2024-03-04: 7 [IU] via SUBCUTANEOUS
  Administered 2024-03-04 – 2024-03-05 (×2): 4 [IU] via SUBCUTANEOUS
  Administered 2024-03-05: 3 [IU] via SUBCUTANEOUS

## 2024-03-03 MED ORDER — PROPOFOL 10 MG/ML IV BOLUS
INTRAVENOUS | Status: AC
Start: 2024-03-03 — End: 2024-03-03
  Filled 2024-03-03: qty 20

## 2024-03-03 MED ORDER — CHLORHEXIDINE GLUCONATE CLOTH 2 % EX PADS
6.0000 | MEDICATED_PAD | Freq: Every day | CUTANEOUS | Status: DC
  Administered 2024-03-03: 6 via TOPICAL

## 2024-03-03 MED ORDER — INSULIN GLARGINE-YFGN 100 UNIT/ML ~~LOC~~ SOLN
8.0000 [IU] | Freq: Two times a day (BID) | SUBCUTANEOUS | Status: DC
  Administered 2024-03-03 – 2024-03-05 (×4): 8 [IU] via SUBCUTANEOUS
  Filled 2024-03-03 (×5): qty 0.08

## 2024-03-03 MED ORDER — SODIUM CHLORIDE 0.9 % IV SOLN
1.0000 g | INTRAVENOUS | Status: DC
  Administered 2024-03-03 – 2024-03-04 (×2): 1 g via INTRAVENOUS
  Filled 2024-03-03 (×2): qty 10

## 2024-03-03 NOTE — Progress Notes (Signed)
  Progress Note   Patient: Brooke [name] FMW:[id number] DOB: 06/29/1961 DOA: 03/02/2024     1 DOS: the patient was seen and examined on 03/03/2024 at 8:17AM      Brief hospital course: 63 y.o. F with sdCHF, DM, HTN, HLD presented with few weeks of increased abdominal discomfort, left leg discomfort and urinary irritative symptoms.  Found to have AKI, right hydronephrosis and thickened bladder.  Urology consulted.     Assessment and Plan: Right hydronephrosis Urinary tract infection S/p left ureteral stent 7/24 by Dr. Selma -Continue Rocephin  - Follow culture data - Remove Foley when fever free for 24 hours - Consult urology, appreciate recommendations -Hold Farxiga  and Vesicare    AKI Creatinine trending in the right direction - IV fluids - Hold Entresto , spironolactone   Hyponatremia Mild, improving with fluids  Diabetes Severe hyperglycemia - Start Semglee  - Increase sliding scale corrections - Consult diabetes educator - Hold home 75/25  Chronic systolic and diastolic congestive heart failure Hypertension HLD Prior EF 20-25% in 2023, since recovered. Blood pressure soft here - Continue Lipitor , Plavix  -Continue metoprolol  - Hold Entresto , spironolactone  -Continue Lipitor        Subjective: Abdominal pain is somewhat improved, her left leg discomfort is still present.  She has had no fever overnight.  No vomiting.  Her appetite is good.     Physical Exam: BP (!) 103/56 (BP Location: Right Arm)   Pulse 80   Temp 98.6 F (37 C)   Resp 16   Ht 5' 4 (1.626 m)   Wt 43.1 kg   LMP 11/16/2014 (Approximate)   SpO2 100%   BMI 16.31 kg/m   Thin adult female, lying in bed, interactive and appropriate RRR, no murmurs, no peripheral edema Respiratory rate normal, lungs clear without rales or wheezes Abdomen soft, mild tenderness in the left side, no ascites or distention Attention normal, affect appropriate, judgment and insight appear normal    Data  Reviewed: Basic metabolic panel shows improving hyponatremia Creatinine down to 1.2 Leukocytosis improving Hemoglobin stable at 9.8 Discussed with urology     Family Communication: Husband at the bedside    Disposition: Status is: Inpatient         Author: Lonni SHAUNNA Dalton, MD 03/03/2024 4:17 PM  For on call review [email or web address].

## 2024-03-03 NOTE — Anesthesia Postprocedure Evaluation (Signed)
 Anesthesia Post Note  Patient: Brooke [name]  Procedure(s) Performed: CYSTOSCOPY, WITH RETROGRADE PYELOGRAM AND URETERAL STENT INSERTION (Right)     Patient location during evaluation: PACU Anesthesia Type: General Level of consciousness: sedated and patient cooperative Pain management: pain level controlled Vital Signs Assessment: post-procedure vital signs reviewed and stable Respiratory status: spontaneous breathing Cardiovascular status: stable Anesthetic complications: no   No notable events documented.  Last Vitals:  Vitals:   03/03/24 0429 03/03/24 0804  BP: 114/64 122/62  Pulse: 87 82  Resp: 18 16  Temp: 36.7 C 36.6 C  SpO2: 100% 100%    Last Pain:  Vitals:   03/03/24 0808  TempSrc:   PainSc: 0-No pain                 Norleen Pope

## 2024-03-03 NOTE — Telephone Encounter (Signed)
 Pharmacy Patient Advocate Encounter  Received notification from [facility] that Prior Authorization for Dexcom G7 Sensor has been APPROVED from 03/03/2024 to 08/30/2024. Ran test claim, Copay is $0.00. This test claim was processed through [facility]- copay amounts may vary at other pharmacies due to pharmacy/plan contracts, or as the patient moves through the different stages of their insurance plan.   PA #/Case ID/Reference #: [id number] Key: [id number]

## 2024-03-03 NOTE — Plan of Care (Signed)
  Problem: Education: Goal: Knowledge of General Education information will improve Description: Including pain rating scale, medication(s)/side effects and non-pharmacologic comfort measures Outcome: Progressing   Problem: Coping: Goal: Level of anxiety will decrease Outcome: Progressing   Problem: Pain Managment: Goal: General experience of comfort will improve and/or be controlled Outcome: Progressing

## 2024-03-03 NOTE — Progress Notes (Signed)
 1 Day Post-Op Subjective: First time meeting Brooke [name].  She was accompanied by her husband.  We reviewed the case and plan.  She reports feeling much better today.  All questions were answered to their satisfaction.  Objective: Vital signs in last 24 hours: Temp:  [97.8 F (36.6 C)-98.6 F (37 C)] 97.8 F (36.6 C) (07/25 0804) Pulse Rate:  [81-100] 82 (07/25 0804) Resp:  [12-18] 16 (07/25 0804) BP: (89-133)/(58-70) 122/62 (07/25 0804) SpO2:  [96 %-100 %] 100 % (07/25 0804) Weight:  [43.1 kg] 43.1 kg (07/24 1303)  Assessment/Plan: # Right ureteral obstruction versus vesicoureteral reflux # Diffuse bladder wall thickening # AKI # UTI  To the OR with Dr. Selma for right ureteral stent placement on 03/02/2024. Discussed the importance of strict glucose control as a pertains to urinary infection and long-term bladder function. Foley catheter can be removed after fever free for 24 hours. Trend labs.  Interval improvement in serum creatinine; 1.24.  Baseline 0.60-0.90. Broad-spectrum ABX while awaiting speciation and sensitivities.  Normothermic overnight and mild leukocytosis has resolved. Ureteroscopy on an outpatient basis  Intake/Output from previous day: 07/24 0701 - 07/25 0700 In: -  Out: 800 [Urine:800]  Intake/Output this shift: No intake/output data recorded.  Physical Exam:  General: Alert and oriented CV: No cyanosis Lungs: equal chest rise Abdomen: Soft, NTND, no rebound or guarding Gu: Foley catheter in place draining cloudy yellow urine  Lab Results: Recent Labs    03/02/24 1316 03/02/24 1334 03/03/24 0227  HGB 9.5* 10.2* 9.8*  HCT 29.4* 30.0* 30.3*   BMET Recent Labs    03/02/24 1316 03/02/24 1334 03/03/24 0227  NA 129* 131* 134*  K 3.6 3.7 4.5  CL 88* 90* 95*  CO2 26  --  24  GLUCOSE 295* 295* 351*  BUN 16 17 16   CREATININE 1.68* 1.60* 1.24*  CALCIUM  8.9  --  8.7*  HGB 9.5* 10.2* 9.8*  WBC 13.9*  --  11.9*     Studies/Results: DG  C-Arm 1-60 Min Result Date: 03/02/2024 EXAM: FLUOROSCOPIC IMAGES TECHNIQUE: Fluoroscopy was provided by the radiology department for procedure. Radiologist was not present during examination. FLUOROSCOPY DOSE AND TYPE: Radiation Dose Index: Reference Air Kerma (in mGy) = 14.30 mGy COMPARISON: None available. CLINICAL HISTORY: Surgery. Cystoscopy, with retrograde pyelogram and ureteral stent insertion; RSTO performed by SMR; 1 minute and 47 seconds of fluoro; Dr. Selma; OR 8. FINDINGS: Intraoperative fluoroscopic imaging was performed during right retrograde pyelogram with ureteral stent placement. IMPRESSION: 1. Intraoperative fluoroscopic spot images, as above. NOTE: Intraoperative fluoroscopic spot images as above. Please refer to the intraoperative report for full details. Electronically signed by: Pinkie Pebbles MD 03/02/2024 08:26 PM EDT RP Workstation: [id number]   CT ABDOMEN PELVIS W CONTRAST Result Date: 03/02/2024 CLINICAL DATA:  Abdomen pain EXAM: CT ABDOMEN AND PELVIS WITH CONTRAST TECHNIQUE: Multidetector CT imaging of the abdomen and pelvis was performed using the standard protocol following bolus administration of intravenous contrast. RADIATION DOSE REDUCTION: This exam was performed according to the departmental dose-optimization program which includes automated exposure control, adjustment of the mA and/or kV according to patient size and/or use of iterative reconstruction technique. CONTRAST:  40mL OMNIPAQUE  IOHEXOL  350 MG/ML SOLN COMPARISON:  CT 11/07/2020 FINDINGS: Lower chest: Lung bases demonstrate no acute airspace disease. Hepatobiliary: No focal liver abnormality is seen. No gallstones, gallbladder wall thickening, or biliary dilatation. Pancreas: Unremarkable. No pancreatic ductal dilatation or surrounding inflammatory changes. Spleen: Normal in size without focal abnormality. Adrenals/Urinary Tract: Adrenal glands  are normal. Bilateral intrarenal vascular calcifications. Moderate right  hydronephrosis with marked hydroureter, down to the level of the UVJ. No obstructing stone. Slight soft tissue thickening at the right UVJ, series 3, image 69. Diffuse bladder wall thickening. Generalized decreased contrast excretion on delayed views consistent with decreased renal function but worse on the right side. Mild perinephric fat stranding on the right. Stomach/Bowel: Stomach nonenlarged. No dilated small bowel. No acute bowel wall thickening. Diverticular disease of the sigmoid colon Vascular/Lymphatic: Aortic atherosclerosis. No enlarged abdominal or pelvic lymph nodes. Reproductive: Uterus and bilateral adnexa are unremarkable. Other: Negative for free air.  Trace volume free fluid in the pelvis Musculoskeletal: No acute or suspicious osseous abnormality IMPRESSION: 1. Moderate right hydronephrosis with marked hydroureter, down to the level of the UVJ, new compared to prior CT. No obstructing stone. Slight soft tissue thickening at the right UVJ, with overall diffuse bladder wall thickening. Mild asymmetrical right perinephric stranding. Correlate with urinalysis to assess for potential UTI and ascending urinary tract infection, obstructing process at the right UVJ not excluded and consider urology consultation. 2. Generalized decreased contrast excretion on delayed views consistent with decreased renal function but worse on the right side. 3. Diverticular disease of the sigmoid colon without acute inflammatory process. 4. Aortic atherosclerosis. Aortic Atherosclerosis (ICD10-I70.0). Electronically Signed   By: Luke Bun M.D.   On: 03/02/2024 16:30   DG Chest 2 View if patient is not in a treatment room. Result Date: 03/02/2024 CLINICAL DATA:  Suspected sepsis EXAM: CHEST - 2 VIEW COMPARISON:  12/26/2023 FINDINGS: The heart size and mediastinal contours are within normal limits. Both lungs are clear. The visualized skeletal structures are unremarkable. IMPRESSION: No active cardiopulmonary  disease. Electronically Signed   By: Franky Crease M.D.   On: 03/02/2024 14:16      LOS: 1 day   Ole Bourdon, NP [facility] Pager: [phone]  03/03/2024, 9:32 AM

## 2024-03-03 NOTE — Telephone Encounter (Signed)
 Patient Product/process development scientist completed.    The patient is insured through [facility].     Ran test claim for Lantus  Pen and the current 30 day co-pay is $4.00.  Ran test claim for Novolog  FlexPen and the current 30 day co-pay is $4.00.  Ran test claim for [employer] and Requires Prior Authorization  Ran test claim for [employer] and Requires Prior Authorization  This test claim was processed through [employer]- copay amounts may vary at other pharmacies due to [employer], or as the patient moves through the different stages of their insurance plan.     Reyes Sharps, CPHT Pharmacy Technician III Certified Patient Advocate [facility] Patient Advocate Team Direct Number: [phone]  Fax: [phone]

## 2024-03-03 NOTE — Transfer of Care (Signed)
 Immediate Anesthesia Transfer of Care Note  Patient: Brooke [name]  Procedure(s) Performed: CYSTOSCOPY, WITH RETROGRADE PYELOGRAM AND URETERAL STENT INSERTION (Right)  Patient Location: PACU  Anesthesia Type:General  Level of Consciousness: awake, alert , and oriented  Airway & Oxygen Therapy: Patient Spontanous Breathing and Patient connected to face mask oxygen  Post-op Assessment: Report given to RN and Post -op Vital signs reviewed and stable  Post vital signs: Reviewed and stable  Last Vitals:  Vitals Value Taken Time  BP 114/64 03/03/24 04:29  Temp 36.7 C 03/03/24 04:29  Pulse 87 03/03/24 04:29  Resp 18 03/03/24 04:29  SpO2 100 % 03/03/24 04:29    Last Pain:  Vitals:   03/03/24 0429  TempSrc: Oral  PainSc:          Complications: No notable events documented.

## 2024-03-03 NOTE — Inpatient Diabetes Management (Signed)
 Inpatient Diabetes Program Recommendations  AACE/ADA: New Consensus Statement on Inpatient Glycemic Control   Target Ranges:  Prepandial:   less than 140 mg/dL      Peak postprandial:   less than 180 mg/dL (1-2 hours)      Critically ill patients:  140 - 180 mg/dL    Latest Reference Range & Units 03/02/24 19:49 03/02/24 20:30 03/02/24 21:38 03/03/24 08:05  Glucose-Capillary 70 - 99 mg/dL    Decadron  5 mg  Novolog  2 units 190 (H) 186 (H) 390 (H)      Novolog  15 uints   Review of Glycemic Control  Diabetes history: DM2 Outpatient Diabetes medications: Humalog  75/25 32 units QAM, 16 units QPM, Farxiga  10 mg daily (stopped by provider due to recurrent UTIs) Current orders for Inpatient glycemic control: Novolog  0-15 units TID with meals, Novolog  0-5 units QHS  Inpatient Diabetes Program Recommendations:    Insulin : Please consider ordering Semglee  8 units Q24H.  HbgA1C:  A1C 14.7% on 03/03/24 indicating an average glucose of 375 mg/dl over the past 2-3 months.  Outpatient DM: At time of discharge, please provide Rx for Dexcom G7 receiver [phone]), Dexcom G7 sensors [phone]). If attending provider is agreeable to switch from Humalog  75/25 to basal/bolus regimen patient will need Rx for Lantus  SoloStar [phone]), Novolog  Flexpens (#[id number]), and insulin  pen needles (#[id number]).  NOTE: Spoke with patient over the phone about diabetes and home regimen for diabetes control. Patient reports she is currently taking Humalog  75/25 32 units QAM and 16 units QPM as an outpatient for diabetes control. Patient reports checking glucose with finger sticks because she needs a new phone to use the Dexcom G7 sensors. Discussed that Dexcom G7 sensors can be used with the Dexcom G7 receiver device and patient states she would like to get a receiver so she can resume using the Dexcom G7 sensors. Patient states she has not been checking finger sticks as often as she should.  She also reports that over the  past couple of weeks, she has not been eating well so she has not been taking the Humalog  75/25 consistently.  She reports that her glucose has been up and down, very erratic.   Patient also notes that she gets steroid injections in her back at times; last injection was in Januray 2025.  Discussed that she received Decadron  5 mg yesterday which is contributing to hyperglycemia today.  Discussed A1C results (14.7% on 03/03/24) and explained that current A1C indicates an average glucose of 375 mg/dl over the past 2-3 months. Discussed glucose and A1C goals. Discussed importance of checking CBGs and maintaining good CBG control to prevent long-term and short-term complications. Explained how hyperglycemia leads to damage within blood vessels which lead to the common complications seen with uncontrolled diabetes. Stressed to the patient the importance of improving glycemic control to prevent further complications from uncontrolled diabetes. Discussed possibly switching insulin  to basal/bolus regimen so she can still take the basal insulin  even when not eating.  Patient reports that she has taken basal/bolus insulin  the past and would be willing to take it that way again if needed.  Informed patient I would check with outpatient [facility] pharmacy and see which long and short acting insulins are covered and make the recommendation to attending but it would be up to attending provider to make the changes if agreeable.  Encouraged patient to follow up with PCP regarding DM control and work to get DM under control and A1C down to 7% or less.  Patient  verbalized understanding of information discussed and reports no further questions at this time related to diabetes.  Thanks, Earnie Gainer, RN, MSN, CDE Diabetes Coordinator Inpatient Diabetes Program [phone] (Team Pager)

## 2024-03-04 DIAGNOSIS — Q6211 Congenital occlusion of ureteropelvic junction: Secondary | ICD-10-CM | POA: Diagnosis not present

## 2024-03-04 DIAGNOSIS — E876 Hypokalemia: Secondary | ICD-10-CM | POA: Insufficient documentation

## 2024-03-04 DIAGNOSIS — N179 Acute kidney failure, unspecified: Secondary | ICD-10-CM | POA: Insufficient documentation

## 2024-03-04 LAB — CBC
HCT: 27.9 % — ABNORMAL LOW (ref 36.0–46.0)
Hemoglobin: 9.1 g/dL — ABNORMAL LOW (ref 12.0–15.0)
MCH: 25.4 pg — ABNORMAL LOW (ref 26.0–34.0)
MCHC: 32.6 g/dL (ref 30.0–36.0)
MCV: 77.9 fL — ABNORMAL LOW (ref 80.0–100.0)
Platelets: 415 K/uL — ABNORMAL HIGH (ref 150–400)
RBC: 3.58 MIL/uL — ABNORMAL LOW (ref 3.87–5.11)
RDW: 13.4 % (ref 11.5–15.5)
WBC: 10.6 K/uL — ABNORMAL HIGH (ref 4.0–10.5)
nRBC: 0 % (ref 0.0–0.2)

## 2024-03-04 LAB — COMPREHENSIVE METABOLIC PANEL WITH GFR
ALT: 7 U/L (ref 0–44)
AST: 14 U/L — ABNORMAL LOW (ref 15–41)
Albumin: 1.9 g/dL — ABNORMAL LOW (ref 3.5–5.0)
Alkaline Phosphatase: 77 U/L (ref 38–126)
Anion gap: 8 (ref 5–15)
BUN: 10 mg/dL (ref 8–23)
CO2: 28 mmol/L (ref 22–32)
Calcium: 8.2 mg/dL — ABNORMAL LOW (ref 8.9–10.3)
Chloride: 99 mmol/L (ref 98–111)
Creatinine, Ser: 0.9 mg/dL (ref 0.44–1.00)
GFR, Estimated: >60 mL/min (ref >60)
Glucose, Bld: 234 mg/dL — ABNORMAL HIGH (ref 70–99)
Potassium: 3.2 mmol/L — ABNORMAL LOW (ref 3.5–5.1)
Sodium: 135 mmol/L (ref 135–145)
Total Bilirubin: 0.3 mg/dL (ref 0.0–1.2)
Total Protein: 6.6 g/dL (ref 6.5–8.1)

## 2024-03-04 LAB — URINE CULTURE: Culture: 2000 — AB

## 2024-03-04 LAB — GLUCOSE, CAPILLARY
Glucose-Capillary: 167 mg/dL — ABNORMAL HIGH (ref 70–99)
Glucose-Capillary: 107 mg/dL — ABNORMAL HIGH (ref 70–99)
Glucose-Capillary: 216 mg/dL — ABNORMAL HIGH (ref 70–99)
Glucose-Capillary: 211 mg/dL — ABNORMAL HIGH (ref 70–99)

## 2024-03-04 MED ORDER — POTASSIUM CHLORIDE CRYS ER 20 MEQ PO TBCR
40.0000 meq | EXTENDED_RELEASE_TABLET | Freq: Once | ORAL | Status: AC
  Administered 2024-03-04: 40 meq via ORAL
  Filled 2024-03-04: qty 2

## 2024-03-04 NOTE — Assessment & Plan Note (Signed)
-   Supplement potassium

## 2024-03-04 NOTE — Assessment & Plan Note (Signed)
 With persistent hyperglycemia, overall improving - Continue Semglee  - Continue sliding scale corrections - Consult diabetes educator - Hold home 75/25

## 2024-03-04 NOTE — Assessment & Plan Note (Signed)
 Urinary tract infection S/p right ureteral stent 7/24 by Dr. Selma Crane from admission growing alpha hemolytic strep.  UCx from procedure with 6K GNRs.    Afebrile last 24 hours - Follow cultures - Continue Rocephin  - Remove foley  - Consult urology, appreciate recommendations - Hold Farxiga  and Vesicare 

## 2024-03-04 NOTE — Assessment & Plan Note (Signed)
 Hypertension HLD Prior EF 20-25% in 2023, since recovered. BP normal to low - Continue Lipitor , Plavix  - Continue metoprolol  - Hold Entresto , spironolactone  - Continue Lipitor 

## 2024-03-04 NOTE — Assessment & Plan Note (Signed)
 Cr 1.68 on admisison, now resolved to baseline with fluids - Hold fluids - Hold Entresto  and spironolactone  today

## 2024-03-04 NOTE — Hospital Course (Signed)
 63 y.o. F with sdCHF, DM, HTN, HLD presented with few weeks of increased abdominal discomfort, left leg discomfort and urinary irritative symptoms.   Found to have AKI, right hydronephrosis and thickened bladder.  Urology consulted.

## 2024-03-04 NOTE — Progress Notes (Signed)
  Progress Note   Patient: Brooke [name] FMW:[id number] DOB: 30-Apr-1961 DOA: 03/02/2024     2 DOS: the patient was seen and examined on 03/04/2024 at 8:32AM      Brief hospital course: 63 y.o. F with sdCHF, DM, HTN, HLD presented with few weeks of increased abdominal discomfort, left leg discomfort and urinary irritative symptoms.   Found to have AKI, right hydronephrosis and thickened bladder.  Urology consulted.     Assessment and Plan: * Right hydronephrosis Urinary tract infection S/p right ureteral stent 7/24 by Dr. Selma Crane from admission growing alpha hemolytic strep.  UCx from procedure with 6K GNRs.    Afebrile last 24 hours - Follow cultures - Continue Rocephin  - Remove foley  - Consult urology, appreciate recommendations - Hold Farxiga  and Vesicare     Hypokalemia - Supplement potassium  AKI (acute kidney injury) (HCC) Cr 1.68 on admisison, now resolved to baseline with fluids - Hold fluids - Hold Entresto  and spironolactone  today  Hyponatremia Likely hypovolemic.  Resolved with fluids  Diabetes (HCC) With persistent hyperglycemia, overall improving - Continue Semglee  - Continue sliding scale corrections - Consult diabetes educator - Hold home 75/25  Chronic systolic heart failure (HCC) Hypertension HLD Prior EF 20-25% in 2023, since recovered. BP normal to low - Continue Lipitor , Plavix  - Continue metoprolol  - Hold Entresto , spironolactone  - Continue Lipitor   Normocytic anemia Hgb has been in the 9s for the last year, recommend PCP follow up No clinical bleeding        Subjective: Patient is feeling gradually better, still somewhat weak.  Having a lot of pain on the right side.  No fever overnight.     Physical Exam: BP 120/63 (BP Location: Right Arm)   Pulse 81   Temp 98.8 F (37.1 C)   Resp 16   Ht 5' 4 (1.626 m)   Wt 43.1 kg   LMP 11/16/2014 (Approximate)   SpO2 100%   BMI 16.31 kg/m   Thin adult female, sitting up in  bed, eating breakfast RRR, soft systolic murmur today, no peripheral edema Respiratory rate normal, lungs clear without rales or wheezes Abdomen with some tenderness on the right side, no guarding, no rigidity  Attention normal, affect normal, judgment and insight appear normal, face metric, speech fluent, upper extremity strength normal    Data Reviewed: Patient metabolic panel shows hypokalemia, normalized renal function CBC shows resolving leukocytosis, mild anemia   Family Communication: Husband at the bedside    Disposition: Status is: Inpatient         Author: Lonni SHAUNNA Dalton, MD 03/04/2024 10:13 AM  For on call review [email or web address].

## 2024-03-04 NOTE — Plan of Care (Signed)

## 2024-03-04 NOTE — Progress Notes (Signed)
 Assumed care at 0700, patient lying in bed with eyes open, even and unlabored breathing, skin warm and dry to the touch, call bell within reach, safety measures are in place.

## 2024-03-04 NOTE — Assessment & Plan Note (Addendum)
 Likely hypovolemic.  Resolved with fluids

## 2024-03-05 DIAGNOSIS — Q6211 Congenital occlusion of ureteropelvic junction: Secondary | ICD-10-CM | POA: Diagnosis not present

## 2024-03-05 LAB — CBC
HCT: 27 % — ABNORMAL LOW (ref 36.0–46.0)
Hemoglobin: 8.8 g/dL — ABNORMAL LOW (ref 12.0–15.0)
MCH: 25.5 pg — ABNORMAL LOW (ref 26.0–34.0)
MCHC: 32.6 g/dL (ref 30.0–36.0)
MCV: 78.3 fL — ABNORMAL LOW (ref 80.0–100.0)
Platelets: 452 K/uL — ABNORMAL HIGH (ref 150–400)
RBC: 3.45 MIL/uL — ABNORMAL LOW (ref 3.87–5.11)
RDW: 13.5 % (ref 11.5–15.5)
WBC: 7.8 K/uL (ref 4.0–10.5)
nRBC: 0 % (ref 0.0–0.2)

## 2024-03-05 LAB — BASIC METABOLIC PANEL WITH GFR
Anion gap: 10 (ref 5–15)
BUN: 6 mg/dL — ABNORMAL LOW (ref 8–23)
CO2: 28 mmol/L (ref 22–32)
Calcium: 8.4 mg/dL — ABNORMAL LOW (ref 8.9–10.3)
Chloride: 101 mmol/L (ref 98–111)
Creatinine, Ser: 0.66 mg/dL (ref 0.44–1.00)
GFR, Estimated: >60 mL/min (ref >60)
Glucose, Bld: 181 mg/dL — ABNORMAL HIGH (ref 70–99)
Potassium: 3.3 mmol/L — ABNORMAL LOW (ref 3.5–5.1)
Sodium: 139 mmol/L (ref 135–145)

## 2024-03-05 LAB — GLUCOSE, CAPILLARY
Glucose-Capillary: 149 mg/dL — ABNORMAL HIGH (ref 70–99)
Glucose-Capillary: 195 mg/dL — ABNORMAL HIGH (ref 70–99)

## 2024-03-05 LAB — URINE CULTURE: Culture: >=100000 — AB

## 2024-03-05 MED ORDER — SULFAMETHOXAZOLE-TRIMETHOPRIM 800-160 MG PO TABS
1.0000 | ORAL_TABLET | Freq: Two times a day (BID) | ORAL | Status: DC
  Administered 2024-03-05: 1 via ORAL
  Filled 2024-03-05 (×2): qty 1

## 2024-03-05 MED ORDER — DEXCOM G7 RECEIVER DEVI
3 refills | Status: DC
  Filled 2024-03-05: qty 1, 90d supply, fill #0
  Filled 2024-03-10: qty 1, 365d supply, fill #0

## 2024-03-05 MED ORDER — LANCET DEVICE MISC
1.0000 | Freq: Three times a day (TID) | 0 refills | Status: AC
  Filled 2024-03-05: qty 1, fill #0

## 2024-03-05 MED ORDER — CIPROFLOXACIN HCL 500 MG PO TABS
500.0000 mg | ORAL_TABLET | Freq: Two times a day (BID) | ORAL | 0 refills | Status: DC
  Filled 2024-03-05: qty 20, 10d supply, fill #0

## 2024-03-05 MED ORDER — POTASSIUM CHLORIDE CRYS ER 20 MEQ PO TBCR
40.0000 meq | EXTENDED_RELEASE_TABLET | Freq: Once | ORAL | Status: AC
  Administered 2024-03-05: 40 meq via ORAL
  Filled 2024-03-05: qty 2

## 2024-03-05 MED ORDER — OXYCODONE-ACETAMINOPHEN 5-325 MG PO TABS
1.0000 | ORAL_TABLET | ORAL | 0 refills | Status: DC | PRN
  Filled 2024-03-05: qty 8, 2d supply, fill #0

## 2024-03-05 MED ORDER — SULFAMETHOXAZOLE-TRIMETHOPRIM 800-160 MG PO TABS
1.0000 | ORAL_TABLET | Freq: Two times a day (BID) | ORAL | 0 refills | Status: DC
  Filled 2024-03-05: qty 20, 10d supply, fill #0

## 2024-03-05 MED ORDER — INSULIN ASPART 100 UNIT/ML FLEXPEN
3.0000 [IU] | PEN_INJECTOR | Freq: Three times a day (TID) | SUBCUTANEOUS | 0 refills | Status: AC
Start: 2024-03-05 — End: ?
  Filled 2024-03-05: qty 15, 35d supply, fill #0

## 2024-03-05 MED ORDER — BLOOD GLUCOSE TEST VI STRP
1.0000 | ORAL_STRIP | Freq: Three times a day (TID) | 0 refills | Status: DC
  Filled 2024-03-05: qty 100, 33d supply, fill #0

## 2024-03-05 MED ORDER — PEN NEEDLES 31G X 5 MM MISC
1.0000 | Freq: Three times a day (TID) | 0 refills | Status: DC
  Filled 2024-03-05: qty 100, 33d supply, fill #0

## 2024-03-05 MED ORDER — INSULIN GLARGINE 100 UNIT/ML SOLOSTAR PEN
18.0000 [IU] | PEN_INJECTOR | Freq: Every day | SUBCUTANEOUS | 3 refills | Status: DC
  Filled 2024-03-05: qty 15, 83d supply, fill #0

## 2024-03-05 MED ORDER — BLOOD GLUCOSE MONITOR SYSTEM W/DEVICE KIT
1.0000 | PACK | Freq: Three times a day (TID) | 0 refills | Status: DC
  Filled 2024-03-05: qty 1, 30d supply, fill #0

## 2024-03-05 MED ORDER — CEFADROXIL 500 MG PO CAPS
500.0000 mg | ORAL_CAPSULE | Freq: Two times a day (BID) | ORAL | 0 refills | Status: DC
  Filled 2024-03-05: qty 20, 10d supply, fill #0

## 2024-03-05 MED ORDER — LANCETS MISC
1.0000 | Freq: Three times a day (TID) | 0 refills | Status: DC
  Filled 2024-03-05: qty 100, 33d supply, fill #0

## 2024-03-05 NOTE — Discharge Summary (Signed)
 Physician Discharge Summary   Patient: Brooke [name] MRN: [id number] DOB: 03/05/1961  Admit date:     03/02/2024  Discharge date: 03/05/24  Discharge Physician: Lonni SHAUNNA Dalton   PCP: [name], MD     Recommendations at discharge:  Follow up with Urology Dr. Selma for urinary tract infection and ureteral stent removal Follow up with Dr. Delbert in 1 week for AKI Dr. Delbert: Check BMP and CBC in 1 week (discharge Cr 0.66, K 3.3, Hgb 8.8) Trend Hgb and check iron if appropriate Review BP and resume spironolactone  if appropriate Review glucoses and Dexcom CGM and adjust insulin  as needed (transitioned from 75/25 to Lantus /Novolog )      Discharge Diagnoses: Principal Problem:   Right hydronephrosis Active Problems:   Diabetes mellitus without complication (HCC)   Hypertension   Depression, major, recurrent, moderate (HCC)   Hyperlipidemia   Chronic systolic heart failure (HCC)   Carotid artery disease (HCC)   Esophageal dysphagia   Diabetes (HCC)   Hyponatremia   AKI (acute kidney injury) (HCC)   Hypokalemia      Hospital Course: 62 y.o. F with sdCHF, DM, HTN, HLD presented with few weeks of increased abdominal discomfort, left leg discomfort and urinary irritative symptoms.   Found to have AKI, right hydronephrosis and thickened bladder.  Urology consulted.     Right hydronephrosis Urinary tract infection S/p right ureteral stent 7/24 by Dr. Selma Admitted and taken to the OR for stent.  UCx from admission growing alpha hemolytic strep and MRSE.  UCx from procedure with Klebsiella.  Discussed with RPh, transitioned to Bactrim  for 10 days to cover all three.    Follow up with Urology Dr. Selma as soon as able for stent removal.    Would avoid Farxiga  and Vesicare  going forward.       Hypokalemia Supplemented.  Recommend PCP follow up.   AKI (acute kidney injury) (HCC) Cr 1.68 on admisison, resolved to baseline with fluids  Entresto  resumed at  discharge, recommend PCP follow up.     Hyponatremia Likely hypovolemic.  Resolved with fluids   Diabetes (HCC) Had severe hyperglycemia and A1c >14%.  Reported skpping insulin  due to not eating.  Discussed with DM educator, plan for transition to Lantus , Novolog  which are covered well by her insurance.  Instructed to take Lantus  even when eating less.  CGM provided at discharge.  Transitioned to: Lantus  18 units daily Novolog  3 units TID with meals plus sliding scale   Chronic systolic heart failure (HCC) Hypertension HLD Prior EF 20-25% in 2023, since recovered.  Appeared dehydrated to euvolemic.    Spironolactone  held temporarily at discharge, recommend PCP follow up.       Normocytic anemia This appears to be close to her baseline over the last year.  Suspect it is related to chronic inflammation/anemia of chronic disease.  No clinical bleeding was observed. - Outpatient follow-up, trend, iron studies                [place]  Controlled Substances Registry was reviewed for this patient prior to discharge.  Consultants: Urology Procedures performed: Right ureteral stent placement Disposition: Home Diet recommendation:  Discharge Diet Orders (From admission, onward)     Start     Ordered   03/05/24 0000  Diet - low sodium heart healthy        03/05/24 1004             DISCHARGE MEDICATION: Allergies as of 03/05/2024  Reactions   Acetaminophen     Metformin     Propoxyphene    Other Reaction(s): break-out   Aspirin  Other (See Comments)   Intolerance (aspirin  sensitive stomach) Other Reaction(s): stomach upset   Darvocet [propoxyphene N-acetaminophen ] Rash   Skin reaction on Vaginal area   Erythromycin Nausea And Vomiting   Gabapentin Other (See Comments)   Suicidal ideations    Ibuprofen Other (See Comments)   Stomach issues   Metformin  And Related Nausea Only        Medication List     PAUSE taking these medications     Farxiga  10 MG Tabs tablet Wait to take this until your doctor or other care provider tells you to start again. Generic drug: dapagliflozin  propanediol Take 1 tablet (10 mg total) by mouth daily.   spironolactone  25 MG tablet Wait to take this until your doctor or other care provider tells you to start again. Commonly known as: ALDACTONE  Take 1 tablet (25 mg total) by mouth daily.       STOP taking these medications    HumaLOG  Mix 75/25 KwikPen (75-25) 100 UNIT/ML KwikPen Generic drug: Insulin  Lispro Prot & Lispro   solifenacin  10 MG tablet Commonly known as: VESICARE        TAKE these medications    acetaminophen  500 MG tablet Commonly known as: TYLENOL  Take 1,000 mg by mouth every 6 (six) hours as needed for mild pain (pain score 1-3) or headache.   aspirin  81 MG chewable tablet Chew 1 tablet (81 mg total) by mouth daily.   atorvastatin  80 MG tablet Commonly known as: LIPITOR  Take 1 tablet (80 mg total) by mouth daily. Please schedule PCP appointment.   Blood Glucose Monitoring Suppl Devi 1 each by Does not apply route 3 (three) times daily. May dispense any manufacturer covered by patient's insurance.   BLOOD GLUCOSE TEST STRIPS Strp 1 each by Does not apply route 3 (three) times daily. Use as directed to check blood sugar. May dispense any manufacturer covered by patient's insurance and fits patient's device.   clopidogrel  75 MG tablet Commonly known as: PLAVIX  Take 1 tablet (75 mg total) by mouth daily.   cyanocobalamin 1000 MCG tablet Commonly known as: VITAMIN B12 Take 3,000 mcg by mouth daily.   Dexcom G7 Receiver Devi Use to check blood sugar 3 times daily. What changed: Another medication with the same name was added. Make sure you understand how and when to take each.   Dexcom G7 Receiver Devi Apply as directed What changed: You were already taking a medication with the same name, and this prescription was added. Make sure you understand how and  when to take each.   Dexcom G7 Sensor Misc Use to check blood sugar 3 times daily.   diazepam  5 MG tablet Commonly known as: VALIUM  Insert 1 tablet vaginally nightly as needed for muscle spasm/ pelvic pain.   Entresto  49-51 MG Generic drug: sacubitril -valsartan  Take 1 tablet by mouth 2 (two) times daily.   insulin  aspart 100 UNIT/ML FlexPen Commonly known as: NOVOLOG  Inject 3-14 Units into the skin 3 (three) times daily with meals. If eating and Blood Glucose (BG) 80 or higher inject 3 units for meal coverage and add correction dose per scale. If not eating, correction dose only. BG <150= 0 unit; BG 150-200= 1 unit; BG 201-250= 3 unit; BG 251-300= 5 unit; BG 301-350= 7 unit; BG 351-400= 9 unit; BG >400= 11 unit and Call Primary Care.   insulin  glargine 100 UNIT/ML Solostar Pen  Commonly known as: LANTUS  Inject 18 Units into the skin daily. May substitute as needed per insurance.   Lancet Device Misc 1 each by Does not apply route 3 (three) times daily. May dispense any manufacturer covered by patient's insurance.   Lancets Misc 1 each by Does not apply route 3 (three) times daily. Use as directed to check blood sugar. May dispense any manufacturer covered by patient's insurance and fits patient's device.   metoprolol  succinate 25 MG 24 hr tablet Commonly known as: TOPROL -XL Take 1 tablet (25 mg total) by mouth daily. Take with or immediately following a meal.   multivitamin with minerals Tabs tablet Take 1 tablet by mouth daily.   oxyCODONE -acetaminophen  5-325 MG tablet Commonly known as: Percocet Take 1 tablet by mouth every 4 (four) hours as needed for severe pain (pain score 7-10).   pantoprazole  20 MG tablet Commonly known as: PROTONIX  Take 1 tablet (20 mg total) by mouth daily.   phenazopyridine  100 MG tablet Commonly known as: Pyridium  Take 1 tablet (100 mg total) by mouth 3 (three) times daily as needed (urinary pain).   Premarin  vaginal cream Generic drug:  conjugated estrogens  Insert 0.5g vaginally nightly for two weeks, then twice a week after   sulfamethoxazole -trimethoprim  800-160 MG tablet Commonly known as: BACTRIM  DS Take 1 tablet by mouth every 12 (twelve) hours.   TechLite Plus Pen Needles 32G X 4 MM Misc Generic drug: Insulin  Pen Needle Use as directed with insulin  pen What changed: Another medication with the same name was added. Make sure you understand how and when to take each.   Pen Needles 31G X 5 MM Misc 1 each by Does not apply route 3 (three) times daily. May dispense any manufacturer covered by patient's insurance. What changed: You were already taking a medication with the same name, and this prescription was added. Make sure you understand how and when to take each.   venlafaxine  XR 75 MG 24 hr capsule Commonly known as: Effexor  XR Take 1 capsule (75 mg total) by mouth daily with breakfast. For headaches        Follow-up Information     Selma Donnice SAUNDERS, MD. Schedule an appointment as soon as possible for a visit.   Specialty: Urology Contact information: [address] [place] [address] [phone]         [name], MD. Schedule an appointment as soon as possible for a visit in 1 week(s).   Specialty: Family Medicine Contact information: [address] [place] [address] [phone]                 Discharge Instructions     Diet - low sodium heart healthy   Complete by: As directed    Discharge instructions   Complete by: As directed    **IMPORTANT DISCHARGE INSTRUCTIONS**   From Dr. Jonel: You were admitted for urinary tract infection We found that this was chronic, and that a blockage of the right urine tube (the ureter from the kidney down to the bladder) was swollen and inflamed  Dr. Selma placed a stent in that ureter to allow it to drain  Continue antibiotics with Bactrim  DS twice daily for 10 more days Call Dr. Cira office to arrange a follow  up appointment  If you have new fever, if you can't pee, or if your right sided abdominal pain gets worse and worse, call his office for instructions  You also had some kidney injury which resolved  here. Go see Dr. Delbert in 1 week Get labs checked to make sure things are okay  Do not take oxycodone  with diazepam , this is dangerous  Resume your normal blood pressure medicines but HOLD spironolactone  until you see Dr. Newlin  STOP Farxiga /dapagliflozin  Discuss with Dr. Newlin if you should restart it or not   Lastly, change your insulins: Switch to Lantus  and Novolog   This is a four shot regimen, not a two shot regimen, that splits apart the 75/25 parts  Take Lantus  18 units daily in the morning  Take this every day, even if you aren't eating much  Take Novolog  with meals  If you ARE eating take three units PLUS the following sliding scale half an hour before hte meal  If you ARE NOT eating, and your sugar is OVER 200, take just the sliding scale:   Sliding scale: Blood Glucose (BG)   BG 151-200= 1 unit BG 201-250= 3 unit  BG 251-300= 5 unit;  BG 301-350= 7 unit;  BG 351-400= 9 unit;  BG >400= 11 unit and Call Primary Care.   Increase activity slowly   Complete by: As directed        Discharge Exam: Filed Weights   03/02/24 1303  Weight: 43.1 kg    General: Pt is alert, awake, not in acute distress Cardiovascular: RRR, nl S1-S2, no murmurs appreciated.   No LE edema.   Respiratory: Normal respiratory rate and rhythm.  CTAB without rales or wheezes. Abdominal: Abdomen soft and mildly tender.  No distension or HSM.   Neuro/Psych: Strength symmetric in upper and lower extremities.  Judgment and insight appear normal.   Condition at discharge: good  The results of significant diagnostics from this hospitalization (including imaging, microbiology, ancillary and laboratory) are listed below for reference.   Imaging Studies: DG C-Arm 1-60 Min Result Date:  03/02/2024 EXAM: FLUOROSCOPIC IMAGES TECHNIQUE: Fluoroscopy was provided by the radiology department for procedure. Radiologist was not present during examination. FLUOROSCOPY DOSE AND TYPE: Radiation Dose Index: Reference Air Kerma (in mGy) = 14.30 mGy COMPARISON: None available. CLINICAL HISTORY: Surgery. Cystoscopy, with retrograde pyelogram and ureteral stent insertion; RSTO performed by SMR; 1 minute and 47 seconds of fluoro; Dr. Selma; OR 8. FINDINGS: Intraoperative fluoroscopic imaging was performed during right retrograde pyelogram with ureteral stent placement. IMPRESSION: 1. Intraoperative fluoroscopic spot images, as above. NOTE: Intraoperative fluoroscopic spot images as above. Please refer to the intraoperative report for full details. Electronically signed by: Pinkie Pebbles MD 03/02/2024 08:26 PM EDT RP Workstation: [id number]   CT ABDOMEN PELVIS W CONTRAST Result Date: 03/02/2024 CLINICAL DATA:  Abdomen pain EXAM: CT ABDOMEN AND PELVIS WITH CONTRAST TECHNIQUE: Multidetector CT imaging of the abdomen and pelvis was performed using the standard protocol following bolus administration of intravenous contrast. RADIATION DOSE REDUCTION: This exam was performed according to the departmental dose-optimization program which includes automated exposure control, adjustment of the mA and/or kV according to patient size and/or use of iterative reconstruction technique. CONTRAST:  40mL OMNIPAQUE  IOHEXOL  350 MG/ML SOLN COMPARISON:  CT 11/07/2020 FINDINGS: Lower chest: Lung bases demonstrate no acute airspace disease. Hepatobiliary: No focal liver abnormality is seen. No gallstones, gallbladder wall thickening, or biliary dilatation. Pancreas: Unremarkable. No pancreatic ductal dilatation or surrounding inflammatory changes. Spleen: Normal in size without focal abnormality. Adrenals/Urinary Tract: Adrenal glands are normal. Bilateral intrarenal vascular calcifications. Moderate right hydronephrosis with marked  hydroureter, down to the level of the UVJ. No obstructing stone. Slight soft tissue thickening at the right UVJ,  series 3, image 69. Diffuse bladder wall thickening. Generalized decreased contrast excretion on delayed views consistent with decreased renal function but worse on the right side. Mild perinephric fat stranding on the right. Stomach/Bowel: Stomach nonenlarged. No dilated small bowel. No acute bowel wall thickening. Diverticular disease of the sigmoid colon Vascular/Lymphatic: Aortic atherosclerosis. No enlarged abdominal or pelvic lymph nodes. Reproductive: Uterus and bilateral adnexa are unremarkable. Other: Negative for free air.  Trace volume free fluid in the pelvis Musculoskeletal: No acute or suspicious osseous abnormality IMPRESSION: 1. Moderate right hydronephrosis with marked hydroureter, down to the level of the UVJ, new compared to prior CT. No obstructing stone. Slight soft tissue thickening at the right UVJ, with overall diffuse bladder wall thickening. Mild asymmetrical right perinephric stranding. Correlate with urinalysis to assess for potential UTI and ascending urinary tract infection, obstructing process at the right UVJ not excluded and consider urology consultation. 2. Generalized decreased contrast excretion on delayed views consistent with decreased renal function but worse on the right side. 3. Diverticular disease of the sigmoid colon without acute inflammatory process. 4. Aortic atherosclerosis. Aortic Atherosclerosis (ICD10-I70.0). Electronically Signed   By: Luke Bun M.D.   On: 03/02/2024 16:30   DG Chest 2 View if patient is not in a treatment room. Result Date: 03/02/2024 CLINICAL DATA:  Suspected sepsis EXAM: CHEST - 2 VIEW COMPARISON:  12/26/2023 FINDINGS: The heart size and mediastinal contours are within normal limits. Both lungs are clear. The visualized skeletal structures are unremarkable. IMPRESSION: No active cardiopulmonary disease. Electronically Signed    By: Franky Crease M.D.   On: 03/02/2024 14:16    Microbiology: Results for orders placed or performed during the hospital encounter of 03/02/24  Culture, blood (Routine x 2)     Status: None (Preliminary result)   Collection Time: 03/02/24  1:11 PM   Specimen: BLOOD  Result Value Ref Range Status   Specimen Description BLOOD SITE NOT SPECIFIED  Final   Special Requests   Final    BOTTLES DRAWN AEROBIC AND ANAEROBIC Blood Culture adequate volume   Culture   Final    NO GROWTH 3 DAYS Performed at [facility], [address] [place], [place] [address]    Report Status PENDING  Incomplete  Urine Culture     Status: Abnormal   Collection Time: 03/02/24  1:11 PM   Specimen: Urine, Random  Result Value Ref Range Status   Specimen Description URINE, RANDOM  Final   Special Requests   Final    NONE Reflexed from [id number] Performed at [facility], [address], [place], [place] [address]    Culture (A)  Final    >=100,000 COLONIES/mL STREPTOCOCCI, ALPHA HEMOLYTIC STAPHYLOCOCCUS EPIDERMIDIS 60,000 COLONIES/mL STAPHYLOCOCCUS EPIDERMIDIS    Report Status 03/05/2024 FINAL  Final   Organism ID, Bacteria STAPHYLOCOCCUS EPIDERMIDIS  Final      Susceptibility   Staphylococcus epidermidis - MIC*    CIPROFLOXACIN  <=0.5 SENSITIVE Sensitive     GENTAMICIN <=0.5 SENSITIVE Sensitive     NITROFURANTOIN  <=16 SENSITIVE Sensitive     OXACILLIN >=4 RESISTANT Resistant     TETRACYCLINE <=1 SENSITIVE Sensitive     VANCOMYCIN 1 SENSITIVE Sensitive     TRIMETH /SULFA  <=10 SENSITIVE Sensitive     RIFAMPIN <=0.5 SENSITIVE Sensitive     Inducible Clindamycin NEGATIVE Sensitive     * STAPHYLOCOCCUS EPIDERMIDIS  Culture, blood (Routine x 2)     Status: None (Preliminary result)   Collection Time: 03/02/24  1:16  PM   Specimen: BLOOD LEFT ARM  Result Value Ref Range Status   Specimen Description BLOOD LEFT ARM  Final   Special Requests   Final    BOTTLES DRAWN AEROBIC AND ANAEROBIC Blood  Culture adequate volume   Culture   Final    NO GROWTH 3 DAYS Performed at [facility], [address] [place], [place] [address]    Report Status PENDING  Incomplete  Urine Culture     Status: Abnormal   Collection Time: 03/02/24  8:17 PM   Specimen: Urine, Cystoscope  Result Value Ref Range Status   Specimen Description CYSTOSCOPY  Final   Special Requests   Final    NONE Performed at [facility], [address] [place], [place] [address]    Culture 2,000 COLONIES/mL KLEBSIELLA PNEUMONIAE (A)  Final   Report Status 03/04/2024 FINAL  Final   Organism ID, Bacteria KLEBSIELLA PNEUMONIAE (A)  Final      Susceptibility   Klebsiella pneumoniae - MIC*    AMPICILLIN RESISTANT Resistant     CEFEPIME <=0.12 SENSITIVE Sensitive     CEFTAZIDIME <=1 SENSITIVE Sensitive     CEFTRIAXONE  <=0.25 SENSITIVE Sensitive     CIPROFLOXACIN  <=0.25 SENSITIVE Sensitive     GENTAMICIN <=1 SENSITIVE Sensitive     IMIPENEM <=0.25 SENSITIVE Sensitive     TRIMETH /SULFA  <=20 SENSITIVE Sensitive     AMPICILLIN/SULBACTAM 4 SENSITIVE Sensitive     PIP/TAZO <=4 SENSITIVE Sensitive ug/mL    * 2,000 COLONIES/mL KLEBSIELLA PNEUMONIAE    Labs: CBC: Recent Labs  Lab 03/02/24 1316 03/02/24 1334 03/03/24 0227 03/04/24 0229 03/05/24 0315  WBC 13.9*  --  11.9* 10.6* 7.8  NEUTROABS 10.5*  --   --   --   --   HGB 9.5* 10.2* 9.8* 9.1* 8.8*  HCT 29.4* 30.0* 30.3* 27.9* 27.0*  MCV 78.8*  --  78.9* 77.9* 78.3*  PLT 444*  --  449* 415* 452*   Basic Metabolic Panel: Recent Labs  Lab 03/02/24 1316 03/02/24 1334 03/03/24 0227 03/04/24 0229 03/05/24 0315  NA 129* 131* 134* 135 139  K 3.6 3.7 4.5 3.2* 3.3*  CL 88* 90* 95* 99 101  CO2 26  --  24 28 28   GLUCOSE 295* 295* 351* 234* 181*  BUN 16 17 16 10  6*  CREATININE 1.68* 1.60* 1.24* 0.90 0.66  CALCIUM  8.9  --  8.7* 8.2* 8.4*   Liver Function Tests: Recent Labs  Lab 03/02/24 1316 03/03/24 0227 03/04/24 0229  AST 12* 10* 14*  ALT 8 8 7    ALKPHOS 94 85 77  BILITOT 1.0 1.0 0.3  PROT 8.4* 7.3 6.6  ALBUMIN 2.5* 2.2* 1.9*   CBG: Recent Labs  Lab 03/04/24 1201 03/04/24 1756 03/04/24 2008 03/05/24 0831 03/05/24 1202  GLUCAP 107* 216* 211* 149* 195*    Discharge time spent: approximately 45 minutes spent on discharge counseling, evaluation of patient on day of discharge, and coordination of discharge planning with nursing, social work, pharmacy and case management  Signed: Lonni SHAUNNA Dalton, MD [facility] 03/05/2024

## 2024-03-05 NOTE — Progress Notes (Signed)
 DISCHARGE NOTE HOME Brooke [name] to be discharged Home per MD order. Discussed prescriptions and follow up appointments with the patient. Prescriptions given to patient; medication list explained in detail. Patient verbalized understanding.  Skin clean, dry and intact without evidence of skin break down, no evidence of skin tears noted. IV catheter discontinued intact. Site without signs and symptoms of complications. Dressing and pressure applied. Pt denies pain at the site currently. No complaints noted.  Patient free of lines, drains, and wounds.   An After Visit Summary (AVS) was printed and given to the patient.  Patient escorted via wheelchair, and discharged home via private auto.  Peyton SHAUNNA Pepper, RN

## 2024-03-06 ENCOUNTER — Other Ambulatory Visit: Payer: Self-pay

## 2024-03-06 ENCOUNTER — Telehealth: Payer: Self-pay | Admitting: *Deleted

## 2024-03-06 NOTE — Transitions of Care (Post Inpatient/ED Visit) (Signed)
 03/06/2024  Name: Brooke [name] MRN: [id number] DOB: 06-Mar-1961  Today's TOC FU Call Status: Today's TOC FU Call Status:: Successful TOC FU Call Completed TOC FU Call Complete Date: 03/06/24 Patient's Name and Date of Birth confirmed.  Transition Care Management Follow-up Telephone Call Date of Discharge: 03/05/24 Discharge Facility: [facility]) Type of Discharge: Inpatient Admission Primary Inpatient Discharge Diagnosis:: Right hydronephrosis How have you been since you were released from the hospital?: Better Any questions or concerns?: No  Items Reviewed: Did you receive and understand the discharge instructions provided?: Yes Medications obtained,verified, and reconciled?: Yes (Medications Reviewed) Any new allergies since your discharge?: No Dietary orders reviewed?: Yes Type of Diet Ordered:: low sodium heart healthy, carb modified Do you have support at home?: Yes People in Home [RPT]: spouse Name of Support/Comfort Primary Source: Bobby/Spouse and Daughter  Medications Reviewed Today: Medications Reviewed Today     Reviewed by Lucky Andrea LABOR, RN (Registered Nurse) on 03/06/24 at 1226  Med List Status: <None>   Medication Order Taking? Sig Documenting Provider Last Dose Status Informant  acetaminophen  (TYLENOL ) 500 MG tablet [id number] Yes Take 1,000 mg by mouth every 6 (six) hours as needed for mild pain (pain score 1-3) or headache. [provider]  Active Self  aspirin  81 MG chewable tablet [id number] Yes Chew 1 tablet (81 mg total) by mouth daily. Bensimhon, Daniel R, MD  Active Self  atorvastatin  (LIPITOR ) 80 MG tablet [id number] Yes Take 1 tablet (80 mg total) by mouth daily. Please schedule PCP appointment. Newlin, Enobong, MD  Active Self, Pharmacy Records           Med Note Nambe, WASHINGTON D   Thu Mar 02, 2024  9:46 PM) Stomach upset  Blood Glucose Monitoring Suppl (BLOOD GLUCOSE MONITOR SYSTEM) w/Device KIT [id number] Yes Use 3 times a day to test blood  glucose. Jonel Lonni SQUIBB, MD  Active   clopidogrel  (PLAVIX ) 75 MG tablet [id number] Yes Take 1 tablet (75 mg total) by mouth daily. Newlin, Enobong, MD  Active Self  conjugated estrogens  (PREMARIN ) vaginal cream [id number]  Insert 0.5g vaginally nightly for two weeks, then twice a week after  Patient not taking: Reported on 03/06/2024   Zuleta, Kaitlin G, NP  Active Self  Continuous Blood Gluc Receiver [facility] G7 RECEIVER) DEVI [id number]  Use to check blood sugar 3 times daily. Newlin, Enobong, MD  Consider Medication Status and Discontinue (No longer needed (for PRN medications)) Self  Continuous Glucose Receiver [facility] G7 RECEIVER) DEVI [id number]  Apply as directed.  Patient not taking: Reported on 03/06/2024   Jonel Lonni SQUIBB, MD  Active   Continuous Glucose Sensor (DEXCOM G7 SENSOR) MISC [id number]  Use to check blood sugar 3 times daily.  Patient not taking: Reported on 03/06/2024   Newlin, Enobong, MD  Active Self, Father  dapagliflozin  propanediol (FARXIGA ) 10 MG TABS tablet [id number]  Take 1 tablet (10 mg total) by mouth daily.  Patient not taking: Reported on 03/06/2024   Newlin, Enobong, MD  Active Self           Med Note AMADOR, WASHINGTON D   Thu Mar 02, 2024  9:52 PM) Stopped d/t UTIs  diazepam  (VALIUM ) 5 MG tablet [id number]  Insert 1 tablet vaginally nightly as needed for muscle spasm/ pelvic pain.  Patient not taking: Reported on 03/06/2024   Zuleta, Kaitlin G, NP  Active Self  Glucose Blood (BLOOD GLUCOSE TEST STRIPS) STRP [id number] Yes 1 each by Does not apply route 3 (three) times  daily. Use as directed to check blood sugar. May dispense any manufacturer covered by patient's insurance and fits patient's device. Jonel Lonni SQUIBB, MD  Active   insulin  aspart (NOVOLOG ) 100 UNIT/ML FlexPen [id number] Yes Inject 3-14 Units into the skin 3 (three) times daily with meals. If eating and Blood Glucose (BG) 80 or higher inject 3 units for meal coverage and add correction dose per  scale. If not eating, correction dose only. BG <150= 0 unit; BG 150-200= 1 unit; BG 201-250= 3 unit; BG 251-300= 5 unit; BG 301-350= 7 unit; BG 351-400= 9 unit; BG >400= 11 unit and Call Primary Care. Jonel Lonni SQUIBB, MD  Active   insulin  glargine (LANTUS ) 100 UNIT/ML Solostar Pen [id number] Yes Inject 18 Units into the skin daily. Danford, Lonni SQUIBB, MD  Active   Insulin  Pen Needle (PEN NEEDLES) 31G X 5 MM MISC [id number] Yes Use 3 times a day. Jonel Lonni SQUIBB, MD  Active   Insulin  Pen Needle (PENTIPS) 32G X 4 MM MISC [id number] Yes Use as directed with insulin  pen Newlin, Enobong, MD  Active Self  Lancet Device MISC [id number] Yes 1 each by Does not apply route 3 (three) times daily. May dispense any manufacturer covered by patient's insurance. Jonel Lonni SQUIBB, MD  Active   Lancets MISC [id number] Yes Use 3 times a day to check blood glucose. Danford, Lonni SQUIBB, MD  Active   metoprolol  succinate (TOPROL -XL) 25 MG 24 hr tablet [id number] Yes Take 1 tablet (25 mg total) by mouth daily. Take with or immediately following a meal. Swinyer, Rosaline HERO, NP  Active Self  Multiple Vitamin (MULTIVITAMIN WITH MINERALS) TABS tablet [id number] Yes Take 1 tablet by mouth daily. [provider]  Active Self  oxyCODONE -acetaminophen  (PERCOCET) 5-325 MG tablet [id number] Yes Take 1 tablet by mouth every 4 (four) hours as needed for severe pain (pain score 7-10). Jonel Lonni SQUIBB, MD  Active   pantoprazole  (PROTONIX ) 20 MG tablet [id number]  Take 1 tablet (20 mg total) by mouth daily.  Patient not taking: Reported on 03/06/2024   Leigh Elspeth SQUIBB, MD  Active Self  phenazopyridine  (PYRIDIUM ) 100 MG tablet [id number]  Take 1 tablet (100 mg total) by mouth 3 (three) times daily as needed (urinary pain).  Patient not taking: Reported on 03/06/2024   Vonna Sharlet POUR, MD  Active Self  sacubitril -valsartan  (ENTRESTO ) 49-51 MG [id number] Yes Take 1 tablet by mouth 2 (two) times daily.  Newlin, Enobong, MD  Active Self, Pharmacy Records  spironolactone  (ALDACTONE ) 25 MG tablet [id number]  Take 1 tablet (25 mg total) by mouth daily.  Patient not taking: Reported on 03/06/2024   Newlin, Enobong, MD  Active Self  sulfamethoxazole -trimethoprim  (BACTRIM  DS) 800-160 MG tablet [id number] Yes Take 1 tablet by mouth every 12 (twelve) hours. Danford, Lonni SQUIBB, MD  Active   venlafaxine  XR (EFFEXOR  XR) 75 MG 24 hr capsule [id number]  Take 1 capsule (75 mg total) by mouth daily with breakfast. For headaches  Patient not taking: Reported on 03/06/2024   Newlin, Enobong, MD  Active Self           Med Note AMADOR JINNIE BIRCH   Thu Mar 02, 2024  9:53 PM) Doesn't work  vitamin B-12 (CYANOCOBALAMIN) 1000 MCG tablet [id number] Yes Take 3,000 mcg by mouth daily. [provider]  Active Self  Med List Note Carline, Lawrnce DASEN, CPhT 11/08/11 1331): Patient stated she found a Vicodin in purse, so she took it for the pain earlier today  Home Care and Equipment/Supplies: Were Home Health Services Ordered?: No Any new equipment or medical supplies ordered?: No  Functional Questionnaire: Do you need assistance with bathing/showering or dressing?: No Do you need assistance with meal preparation?: No Do you need assistance with eating?: No Do you have difficulty maintaining continence: No Do you need assistance with getting out of bed/getting out of a chair/moving?: No Do you have difficulty managing or taking your medications?: No  Follow up appointments reviewed: PCP Follow-up appointment confirmed?: Yes Date of PCP follow-up appointment?: 03/15/24 Follow-up Provider: Dr. Newlin Specialist [facility] Follow-up appointment confirmed?: Yes Date of Specialist follow-up appointment?: 03/14/24 Follow-Up Specialty Provider:: Urology/Dr. Selma Do you need transportation to your follow-up appointment?: No Do you understand care options if your condition(s) worsen?: Yes-patient  verbalized understanding  SDOH Interventions Today    Flowsheet Row Most Recent Value  SDOH Interventions   Food Insecurity Interventions Intervention Not Indicated  Housing Interventions Intervention Not Indicated  Transportation Interventions Intervention Not Indicated  Utilities Interventions Intervention Not Indicated    Andrea Dimes RN, BSN   [facility] RN Care Manager [phone]

## 2024-03-07 LAB — CULTURE, BLOOD (ROUTINE X 2)
Culture: NO GROWTH
Culture: NO GROWTH
Special Requests: ADEQUATE
Special Requests: ADEQUATE

## 2024-03-09 ENCOUNTER — Other Ambulatory Visit: Payer: Self-pay

## 2024-03-10 ENCOUNTER — Other Ambulatory Visit: Payer: Self-pay | Admitting: Family Medicine

## 2024-03-10 ENCOUNTER — Other Ambulatory Visit: Payer: Self-pay

## 2024-03-13 ENCOUNTER — Other Ambulatory Visit: Payer: Self-pay

## 2024-03-13 MED ORDER — CLOPIDOGREL BISULFATE 75 MG PO TABS
75.0000 mg | ORAL_TABLET | Freq: Every day | ORAL | 1 refills | Status: AC
  Filled 2024-03-13: qty 90, 90d supply, fill #0
  Filled 2024-09-13: qty 90, 90d supply, fill #1

## 2024-03-14 ENCOUNTER — Telehealth: Payer: Self-pay | Admitting: Family Medicine

## 2024-03-14 DIAGNOSIS — R338 Other retention of urine: Secondary | ICD-10-CM | POA: Diagnosis not present

## 2024-03-14 DIAGNOSIS — N320 Bladder-neck obstruction: Secondary | ICD-10-CM | POA: Diagnosis not present

## 2024-03-14 DIAGNOSIS — N130 Hydronephrosis with ureteropelvic junction obstruction: Secondary | ICD-10-CM | POA: Diagnosis not present

## 2024-03-14 DIAGNOSIS — N3000 Acute cystitis without hematuria: Secondary | ICD-10-CM | POA: Diagnosis not present

## 2024-03-14 NOTE — Telephone Encounter (Signed)
 Pt unconfirmed appt 8/5

## 2024-03-15 ENCOUNTER — Other Ambulatory Visit: Payer: Self-pay

## 2024-03-15 ENCOUNTER — Ambulatory Visit: Attending: Family Medicine | Admitting: Family Medicine

## 2024-03-15 ENCOUNTER — Encounter: Payer: Self-pay | Admitting: Family Medicine

## 2024-03-15 VITALS — BP 95/59 | HR 75 | Ht 64.0 in | Wt 103.8 lb

## 2024-03-15 DIAGNOSIS — E1159 Type 2 diabetes mellitus with other circulatory complications: Secondary | ICD-10-CM | POA: Diagnosis not present

## 2024-03-15 DIAGNOSIS — F418 Other specified anxiety disorders: Secondary | ICD-10-CM | POA: Diagnosis not present

## 2024-03-15 DIAGNOSIS — I2510 Atherosclerotic heart disease of native coronary artery without angina pectoris: Secondary | ICD-10-CM

## 2024-03-15 DIAGNOSIS — R1319 Other dysphagia: Secondary | ICD-10-CM

## 2024-03-15 DIAGNOSIS — Z794 Long term (current) use of insulin: Secondary | ICD-10-CM

## 2024-03-15 DIAGNOSIS — I779 Disorder of arteries and arterioles, unspecified: Secondary | ICD-10-CM

## 2024-03-15 DIAGNOSIS — I5023 Acute on chronic systolic (congestive) heart failure: Secondary | ICD-10-CM

## 2024-03-15 DIAGNOSIS — E876 Hypokalemia: Secondary | ICD-10-CM

## 2024-03-15 DIAGNOSIS — Z9689 Presence of other specified functional implants: Secondary | ICD-10-CM | POA: Diagnosis not present

## 2024-03-15 DIAGNOSIS — R1312 Dysphagia, oropharyngeal phase: Secondary | ICD-10-CM

## 2024-03-15 DIAGNOSIS — Z9889 Other specified postprocedural states: Secondary | ICD-10-CM

## 2024-03-15 DIAGNOSIS — D649 Anemia, unspecified: Secondary | ICD-10-CM

## 2024-03-15 DIAGNOSIS — F32A Depression, unspecified: Secondary | ICD-10-CM

## 2024-03-15 DIAGNOSIS — E1169 Type 2 diabetes mellitus with other specified complication: Secondary | ICD-10-CM | POA: Diagnosis not present

## 2024-03-15 DIAGNOSIS — I110 Hypertensive heart disease with heart failure: Secondary | ICD-10-CM

## 2024-03-15 DIAGNOSIS — I152 Hypertension secondary to endocrine disorders: Secondary | ICD-10-CM | POA: Diagnosis not present

## 2024-03-15 MED ORDER — INSULIN GLARGINE 100 UNIT/ML SOLOSTAR PEN
20.0000 [IU] | PEN_INJECTOR | Freq: Every day | SUBCUTANEOUS | 3 refills | Status: AC
  Filled 2024-03-15: qty 15, 75d supply, fill #0

## 2024-03-15 MED ORDER — ATORVASTATIN CALCIUM 80 MG PO TABS
80.0000 mg | ORAL_TABLET | Freq: Every day | ORAL | 1 refills | Status: AC
  Filled 2024-03-15: qty 90, 90d supply, fill #0
  Filled 2024-09-13: qty 90, 90d supply, fill #1

## 2024-03-15 MED ORDER — METOPROLOL SUCCINATE ER 25 MG PO TB24: 12.5000 mg | ORAL_TABLET | Freq: Every day | ORAL | Status: DC

## 2024-03-15 NOTE — Patient Instructions (Signed)
 VISIT SUMMARY:  Today, we reviewed your recent hospitalization and current health status. We discussed your kidney condition, diabetes management, heart failure, anemia, swallowing difficulties, and coronary artery disease. We also made adjustments to your medications and provided instructions for follow-up care.  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 You had a stent placed to help with this, but it led to a urinary tract infection and acute kidney injury. We will check your kidney function and potassium levels with blood work. Please follow up with your urologist on August 10.  -TYPE 2 DIABETES MELLITUS, UNCONTROLLED: Your blood sugar levels are high, and your A1c is 14.7. We have increased your Lantus  to 20 units at bedtime and will help you adjust it to get your fasting sugars closer to 100. Continue using Humalog  as prescribed and monitor your blood sugar with the [place] device.  -HEART FAILURE WITH MID-RANGE EJECTION FRACTION AND HYPOTENSION: Heart failure means your heart doesn't pump blood as well as it should. Your blood pressure is low, so we have reduced your metoprolol  to 12.5 mg once daily. Continue taking Entresto  as prescribed and follow up with your cardiologist.  -ANEMIA, UNSPECIFIED: Anemia means you have a lower than normal number of red blood cells. We will recheck your hemoglobin levels with a blood test.  -DYSPHAGIA, OROPHARYNGEAL: Dysphagia is difficulty swallowing. To minimize choking, we recommend eating pureed foods.  -CORONARY ARTERY DISEASE: Coronary artery disease is a condition where the blood vessels supplying your heart are narrowed or blocked. Continue taking atorvastatin  and Plavix  as prescribed. We have refilled your atorvastatin  prescription.  -HYPOKALEMIA: Hypokalemia means you have low potassium levels. We will check your potassium  levels with blood work.  INSTRUCTIONS:  Please follow up with your urologist on August 10. Continue monitoring your blood sugar levels and adjust your insulin  as needed. Follow up with your cardiologist as planned. We will recheck your kidney function, potassium levels, and hemoglobin with blood work.

## 2024-03-15 NOTE — Progress Notes (Signed)
 Subjective:  Patient ID: Brooke [name], female    DOB: [birth year]  Age: 63 y.o. MRN: [id number]  CC: Hospitalization Follow-up     Discussed the use of AI scribe software for clinical note transcription with the patient, who gave verbal consent to proceed.  History of Present Illness Brooke [name] is a 63 year old female with type 2 diabetes mellitus (A1c 9.9), HFmEF (EF 55 to 60% on echo of 04/2022 improved from 20 to 25%), CAD, hypertension, anxiety and depression, protein calorie malnutrition, lumbar radiculopathy who presents for follow-up after recent hospitalization and medication management.  She was hospitalized for UTI, AKI, right hydronephrosis status post ureteral stent . She developed a urinary tract infection and acute kidney injury due to dehydration from inadequate oral intake. This resulted in significant weakness and near syncope. Keflex  was initially prescribed at an ED visit prior to hospitalization but caused gastrointestinal discomfort. During hospitalizatization Farxiga  and spironolactone  were held.  She was discharged with anemia, hemoglobin at 8.8 and hypokalemia of 3.3. Since discharge she has [facility] to [facility] and has had Foley catheter placed.  She manages heart failure with Entresto  twice daily and metoprolol  once daily. She experiences fatigue and weakness but no dizziness or lightheadedness. Her diabetes management changed after stopping Farxiga  due to the urinary tract infection. She now uses Lantus  daily, with blood sugars in the 200s and an A1c of 14.7. She is learning to use the [place] device for glucose monitoring but does not have a sensor on.  She experiences difficulty swallowing, with food getting stuck in her throat, causing choking and regurgitation. Her esophagus has been stretched previously, but symptoms persist. She has not rescheduled with her gastroenterologist, Dr. Jeannetta.  She continues atorvastatin  and Plavix  for coronary artery  disease.  She no longer needs Effexor  for anxiety and depression.     Past Medical History:  Diagnosis Date   Abdominal pain, left lower quadrant 12/17/2015   Anxiety 07/01/2016   Carotid artery disease (HCC) 10/27/2022   Chest pain, unspecified 10/05/2003   CHF (congestive heart failure) (HCC)    Chronic systolic heart failure (HCC) 02/20/2022   Depression, major, recurrent, moderate (HCC) 07/31/2005   Diabetes mellitus without complication (HCC) 09/08/2006   Endometriosis 03/28/1996   History of colonic polyps 08/07/2015   20 mm removed 05/10/15 by Dr. Kristie     Hypertension 11/23/2003   PAD (peripheral artery disease) (HCC)    Unspecified constipation 06/24/2004    Past Surgical History:  Procedure Laterality Date   ABDOMINAL AORTOGRAM W/LOWER EXTREMITY Left 07/29/2023   Procedure: ABDOMINAL AORTOGRAM W/LOWER EXTREMITY;  Surgeon: Gretta Lonni PARAS, MD;  Location: [facility] INVASIVE CV LAB;  Service: Cardiovascular;  Laterality: Left;   APPENDECTOMY  1996   Dr. Buford   BREAST SURGERY Bilateral 1996   Dr. Ardeen   CYSTOSCOPY W/ URETERAL STENT PLACEMENT Right 03/02/2024   Procedure: CYSTOSCOPY, WITH RETROGRADE PYELOGRAM AND URETERAL STENT INSERTION;  Surgeon: Selma Donnice SAUNDERS, MD;  Location: [facility] OR;  Service: Urology;  Laterality: Right;   ESOPHAGEAL MANOMETRY N/A 03/23/2023   Procedure: ESOPHAGEAL MANOMETRY (EM);  Surgeon: Leigh Elspeth SQUIBB, MD;  Location: [facility] ENDOSCOPY;  Service: Gastroenterology;  Laterality: N/A;   INGUINAL HERNIA REPAIR     OVARIAN CYST REMOVAL     PERIPHERAL VASCULAR BALLOON ANGIOPLASTY Left 07/29/2023   Procedure: PERIPHERAL VASCULAR BALLOON ANGIOPLASTY;  Surgeon: Gretta Lonni PARAS, MD;  Location: [facility] INVASIVE CV LAB;  Service: Cardiovascular;  Laterality: Left;   RIGHT/LEFT HEART CATH AND  CORONARY ANGIOGRAPHY N/A 10/09/2021   Procedure: RIGHT/LEFT HEART CATH AND CORONARY ANGIOGRAPHY;  Surgeon: Wendel Lurena POUR, MD;  Location: [facility] INVASIVE CV LAB;  Service:  Cardiovascular;  Laterality: N/A;   TOOTH EXTRACTION N/A 07/28/2022   Procedure: EXTRACTION ALL REMAING TEETH THREE, FOUR, FIVE, SIX, SEVEN, EIGHT, NINE, TEN, ELEVEN, TWELVE, TWENTY ONE, TWENTY TWO, TWENTY THREE, TWENTY FOUR, TWENTY FIVE, TWENTY SIX, TWENTY SEVEN, TWENTY EIGHT, TWENTY NINE, THIRTY, AVEOLOPLASTY;  Surgeon: Sheryle Hamilton, DMD;  Location: [facility] OR;  Service: Oral Surgery;  Laterality: N/A;    Family History  Problem Relation Age of Onset   Diabetes Mother    Hypertension Mother    Arthritis Mother        RA   Pancreatic cancer Father    Stomach cancer Neg Hx    Colon cancer Neg Hx    Esophageal cancer Neg Hx    Rectal cancer Neg Hx     Social History   Socioeconomic History   Marital status: Married    Spouse name: Not on file   Number of children: 1   Years of education: Not on file   Highest education level: Not on file  Occupational History   Not on file  Tobacco Use   Smoking status: Never   Smokeless tobacco: Never  Vaping Use   Vaping status: Never Used  Substance and Sexual Activity   Alcohol use: No   Drug use: No   Sexual activity: Not Currently  Other Topics Concern   Not on file  Social History Narrative   Not on file   Social Drivers of Health   Financial Resource Strain: Medium Risk (10/31/2021)   Overall Financial Resource Strain (CARDIA)    Difficulty of Paying Living Expenses: Somewhat hard  Food Insecurity: No Food Insecurity (03/06/2024)   Hunger Vital Sign    Worried About Running Out of Food in the Last Year: Never true    Ran Out of Food in the Last Year: Never true  Transportation Needs: No Transportation Needs (03/06/2024)   PRAPARE - Administrator, Civil Service (Medical): No    Lack of Transportation (Non-Medical): No  Physical Activity: Not on file  Stress: Not on file  Social Connections: Not on file    Allergies  Allergen Reactions   Acetaminophen     Metformin     Propoxyphene     Other Reaction(s):  break-out   Aspirin  Other (See Comments)    Intolerance (aspirin  sensitive stomach)  Other Reaction(s): stomach upset   Darvocet [Propoxyphene N-Acetaminophen ] Rash    Skin reaction on Vaginal area   Erythromycin Nausea And Vomiting   Gabapentin Other (See Comments)    Suicidal ideations    Ibuprofen Other (See Comments)    Stomach issues   Metformin  And Related Nausea Only    Outpatient Medications Prior to Visit  Medication Sig Dispense Refill   acetaminophen  (TYLENOL ) 500 MG tablet Take 1,000 mg by mouth every 6 (six) hours as needed for mild pain (pain score 1-3) or headache.     aspirin  81 MG chewable tablet Chew 1 tablet (81 mg total) by mouth daily. 30 tablet 1   Blood Glucose Monitoring Suppl (BLOOD GLUCOSE MONITOR SYSTEM) w/Device KIT Use 3 times a day to test blood glucose. 1 kit 0   clopidogrel  (PLAVIX ) 75 MG tablet Take 1 tablet (75 mg total) by mouth daily. 90 tablet 1   conjugated estrogens  (PREMARIN ) vaginal cream Insert 0.5g vaginally nightly for two weeks, then twice  a week after (Patient not taking: Reported on 03/06/2024) 42.5 g 11   Continuous Blood Gluc Receiver (DEXCOM G7 RECEIVER) DEVI Use to check blood sugar 3 times daily. 1 each 0   Continuous Glucose Receiver (DEXCOM G7 RECEIVER) DEVI Apply as directed. (Patient not taking: Reported on 03/06/2024) 3 each 3   Continuous Glucose Sensor (DEXCOM G7 SENSOR) MISC Use to check blood sugar 3 times daily. (Patient not taking: Reported on 03/06/2024) 3 each 6   Glucose Blood (BLOOD GLUCOSE TEST STRIPS) STRP 1 each by Does not apply route 3 (three) times daily. Use as directed to check blood sugar. May dispense any manufacturer covered by patient's insurance and fits patient's device. 100 strip 0   insulin  aspart (NOVOLOG ) 100 UNIT/ML FlexPen Inject 3-14 Units into the skin 3 (three) times daily with meals. If eating and Blood Glucose (BG) 80 or higher inject 3 units for meal coverage and add correction dose per scale. If not  eating, correction dose only. BG <150= 0 unit; BG 150-200= 1 unit; BG 201-250= 3 unit; BG 251-300= 5 unit; BG 301-350= 7 unit; BG 351-400= 9 unit; BG >400= 11 unit and Call Primary Care. 15 mL 0   Insulin  Pen Needle (PEN NEEDLES) 31G X 5 MM MISC Use 3 times a day. 100 each 0   Insulin  Pen Needle (PENTIPS) 32G X 4 MM MISC Use as directed with insulin  pen 100 each 0   Lancet Device MISC 1 each by Does not apply route 3 (three) times daily. May dispense any manufacturer covered by patient's insurance. 1 each 0   Lancets MISC Use 3 times a day to check blood glucose. 100 each 0   Multiple Vitamin (MULTIVITAMIN WITH MINERALS) TABS tablet Take 1 tablet by mouth daily.     oxyCODONE -acetaminophen  (PERCOCET) 5-325 MG tablet Take 1 tablet by mouth every 4 (four) hours as needed for severe pain (pain score 7-10). 8 tablet 0   sacubitril -valsartan  (ENTRESTO ) 49-51 MG Take 1 tablet by mouth 2 (two) times daily. 180 tablet 3   spironolactone  (ALDACTONE ) 25 MG tablet Take 1 tablet (25 mg total) by mouth daily. (Patient not taking: Reported on 03/06/2024) 90 tablet 1   sulfamethoxazole -trimethoprim  (BACTRIM  DS) 800-160 MG tablet Take 1 tablet by mouth every 12 (twelve) hours. 20 tablet 0   vitamin B-12 (CYANOCOBALAMIN) 1000 MCG tablet Take 3,000 mcg by mouth daily.     atorvastatin  (LIPITOR ) 80 MG tablet Take 1 tablet (80 mg total) by mouth daily. Please schedule PCP appointment. 30 tablet 0   dapagliflozin  propanediol (FARXIGA ) 10 MG TABS tablet Take 1 tablet (10 mg total) by mouth daily. (Patient not taking: Reported on 03/06/2024) 30 tablet 3   diazepam  (VALIUM ) 5 MG tablet Insert 1 tablet vaginally nightly as needed for muscle spasm/ pelvic pain. (Patient not taking: Reported on 03/06/2024) 15 tablet 1   insulin  glargine (LANTUS ) 100 UNIT/ML Solostar Pen Inject 18 Units into the skin daily. 15 mL 3   metoprolol  succinate (TOPROL -XL) 25 MG 24 hr tablet Take 1 tablet (25 mg total) by mouth daily. Take with or  immediately following a meal. 90 tablet 3   pantoprazole  (PROTONIX ) 20 MG tablet Take 1 tablet (20 mg total) by mouth daily. (Patient not taking: Reported on 03/06/2024) 30 tablet 3   phenazopyridine  (PYRIDIUM ) 100 MG tablet Take 1 tablet (100 mg total) by mouth 3 (three) times daily as needed (urinary pain). (Patient not taking: Reported on 03/06/2024) 10 tablet 0   venlafaxine  XR (EFFEXOR   XR) 75 MG 24 hr capsule Take 1 capsule (75 mg total) by mouth daily with breakfast. For headaches (Patient not taking: Reported on 03/06/2024) 30 capsule 3   No facility-administered medications prior to visit.     ROS Review of Systems  Constitutional:  Negative for activity change and appetite change.  HENT:  Negative for sinus pressure and sore throat.   Respiratory:  Negative for chest tightness, shortness of breath and wheezing.   Cardiovascular:  Negative for chest pain and palpitations.  Gastrointestinal:  Negative for abdominal distention, abdominal pain and constipation.  Genitourinary: Negative.   Musculoskeletal: Negative.   Psychiatric/Behavioral:  Negative for behavioral problems and dysphoric mood.     Objective:  BP (!) 95/59   Pulse 75   Ht 5' 4 (1.626 m)   Wt 103 lb 12.8 oz (47.1 kg)   LMP 11/16/2014 (Approximate)   SpO2 99%   BMI 17.82 kg/m      03/15/2024    2:54 PM 03/05/2024   12:02 PM 03/05/2024    8:29 AM  BP/Weight  Systolic BP 95 125 122  Diastolic BP 59 73 73  Wt. (Lbs) 103.8    BMI 17.82 kg/m2        Physical Exam Constitutional:      Appearance: She is well-developed.     Comments: Underweight  Cardiovascular:     Rate and Rhythm: Normal rate.     Heart sounds: Normal heart sounds. No murmur heard. Pulmonary:     Effort: Pulmonary effort is normal.     Breath sounds: Normal breath sounds. No wheezing or rales.  Chest:     Chest wall: No tenderness.  Abdominal:     General: Bowel sounds are normal. There is no distension.     Palpations: Abdomen is  soft. There is no mass.     Tenderness: There is no abdominal tenderness.  Musculoskeletal:        General: Normal range of motion.     Right lower leg: No edema.     Left lower leg: No edema.  Neurological:     Mental Status: She is alert and oriented to person, place, and time.  Psychiatric:        Mood and Affect: Mood normal.        Latest Ref Rng & Units 03/05/2024    3:15 AM 03/04/2024    2:29 AM 03/03/2024    2:27 AM  CMP  Glucose 70 - 99 mg/dL 818  765  648   BUN 8 - 23 mg/dL 6  10  16    Creatinine 0.44 - 1.00 mg/dL 9.33  9.09  8.75   Sodium 135 - 145 mmol/L 139  135  134   Potassium 3.5 - 5.1 mmol/L 3.3  3.2  4.5   Chloride 98 - 111 mmol/L 101  99  95   CO2 22 - 32 mmol/L 28  28  24    Calcium  8.9 - 10.3 mg/dL 8.4  8.2  8.7   Total Protein 6.5 - 8.1 g/dL  6.6  7.3   Total Bilirubin 0.0 - 1.2 mg/dL  0.3  1.0   Alkaline Phos 38 - 126 U/L  77  85   AST 15 - 41 U/L  14  10   ALT 0 - 44 U/L  7  8     Lipid Panel     Component Value Date/Time   CHOL 147 02/20/2022 1548   CHOL 192 12/13/2015 0856  TRIG 134 02/20/2022 1548   HDL 42 02/20/2022 1548   HDL 41 12/13/2015 0856   CHOLHDL 3.5 02/20/2022 1548   VLDL 27 02/20/2022 1548   LDLCALC 78 02/20/2022 1548   LDLCALC 118 (H) 12/13/2015 0856    CBC    Component Value Date/Time   WBC 7.8 03/05/2024 0315   RBC 3.45 (L) 03/05/2024 0315   HGB 8.8 (L) 03/05/2024 0315   HCT 27.0 (L) 03/05/2024 0315   PLT 452 (H) 03/05/2024 0315   MCV 78.3 (L) 03/05/2024 0315   MCH 25.5 (L) 03/05/2024 0315   MCHC 32.6 03/05/2024 0315   RDW 13.5 03/05/2024 0315   LYMPHSABS 2.2 03/02/2024 1316   MONOABS 0.9 03/02/2024 1316   EOSABS 0.0 03/02/2024 1316   BASOSABS 0.0 03/02/2024 1316    Lab Results  Component Value Date   HGBA1C 14.7 (H) 03/03/2024       Assessment & Plan History of right renal stent Recent hospitalization for hydronephrosis with stent placement, acute kidney injury, and UTI. Currently catheterized. Low  potassium at discharge. - Order blood work to check kidney function and potassium levels. - Follow up with urologist on August 10.  Type 2 diabetes mellitus, uncontrolled Blood sugars in 200s, A1c 14.7. Farxiga  discontinued due to UTI and hydronephrosis. Insulin  regimen adjusted. - Increase Lantus  to 20 units at bedtime. - Empower her to adjust Lantus  by increasing by 2 units if fasting sugars not closer to 100, target 80-120. - Continue Humalog  as prescribed. - Educate on adjusting insulin  based on readings.  Heart failure with mid-range ejection fraction and hypotension Blood pressure 95/59. Spironolactone  on hold, metoprolol  reduced. Continues on Entresto  and metoprolol . - Decrease metoprolol  to 12.5 mg once daily. - Continue Entresto  as prescribed. - Follow up with cardiologist.  Anemia, unspecified Discharged with hemoglobin 8.8. - Order blood count to recheck hemoglobin levels.  Dysphagia, oropharyngeal Difficulty swallowing, choking, regurgitation. Previous esophageal dilation. - Encourage pureed foods to minimize choking.  Coronary artery disease Continues on atorvastatin  and Plavix . - Refill atorvastatin  prescription. - Continue Plavix  as prescribed.  Hypokalemia Low potassium at discharge. - Order blood work to check potassium levels.  Anxiety and depression -controlled Effexor  no longer indicated hence have discontinued this   Meds ordered this encounter  Medications   insulin  glargine (LANTUS ) 100 UNIT/ML Solostar Pen    Sig: Inject 20 Units into the skin daily.    Dispense:  15 mL    Refill:  3    Dose increase   atorvastatin  (LIPITOR ) 80 MG tablet    Sig: Take 1 tablet (80 mg total) by mouth daily.    Dispense:  90 tablet    Refill:  1   metoprolol  succinate (TOPROL -XL) 25 MG 24 hr tablet    Sig: Take 0.5 tablets (12.5 mg total) by mouth daily. Take with or immediately following a meal.    Dose decrease    Follow-up: Return in about 3 months  (around 06/15/2024) for Chronic medical conditions.       Corrina Sabin, MD, FAAFP. [facility] [place], [place] [phone]   03/15/2024, 5:39 PM

## 2024-03-16 ENCOUNTER — Ambulatory Visit: Payer: Self-pay | Admitting: Family Medicine

## 2024-03-16 ENCOUNTER — Other Ambulatory Visit: Payer: Self-pay

## 2024-03-16 DIAGNOSIS — E875 Hyperkalemia: Secondary | ICD-10-CM

## 2024-03-16 LAB — CBC WITH DIFFERENTIAL/PLATELET
Basophils Absolute: 0 x10E3/uL (ref 0.0–0.2)
Basos: 0 %
EOS (ABSOLUTE): 0.1 x10E3/uL (ref 0.0–0.4)
Eos: 1 %
Hematocrit: 30.1 % — ABNORMAL LOW (ref 34.0–46.6)
Hemoglobin: 9.4 g/dL — ABNORMAL LOW (ref 11.1–15.9)
Immature Grans (Abs): 0 x10E3/uL (ref 0.0–0.1)
Immature Granulocytes: 0 %
Lymphocytes Absolute: 2.2 x10E3/uL (ref 0.7–3.1)
Lymphs: 27 %
MCH: 25.9 pg — ABNORMAL LOW (ref 26.6–33.0)
MCHC: 31.2 g/dL — ABNORMAL LOW (ref 31.5–35.7)
MCV: 83 fL (ref 79–97)
Monocytes Absolute: 0.7 x10E3/uL (ref 0.1–0.9)
Monocytes: 8 %
Neutrophils Absolute: 5.2 x10E3/uL (ref 1.4–7.0)
Neutrophils: 64 %
Platelets: 469 x10E3/uL — ABNORMAL HIGH (ref 150–450)
RBC: 3.63 x10E6/uL — ABNORMAL LOW (ref 3.77–5.28)
RDW: 15.5 % — ABNORMAL HIGH (ref 11.7–15.4)
WBC: 8.3 x10E3/uL (ref 3.4–10.8)

## 2024-03-16 LAB — CMP14+EGFR
ALT: 11 IU/L (ref 0–32)
AST: 15 IU/L (ref 0–40)
Albumin: 3.8 g/dL — ABNORMAL LOW (ref 3.9–4.9)
Alkaline Phosphatase: 101 IU/L (ref 44–121)
BUN/Creatinine Ratio: 16 (ref 12–28)
BUN: 18 mg/dL (ref 8–27)
Bilirubin Total: <0.2 mg/dL (ref 0.0–1.2)
CO2: 22 mmol/L (ref 20–29)
Calcium: 9.6 mg/dL (ref 8.7–10.3)
Chloride: 99 mmol/L (ref 96–106)
Creatinine, Ser: 1.15 mg/dL — ABNORMAL HIGH (ref 0.57–1.00)
Globulin, Total: 3.6 g/dL (ref 1.5–4.5)
Glucose: 78 mg/dL (ref 70–99)
Potassium: 5.8 mmol/L — ABNORMAL HIGH (ref 3.5–5.2)
Sodium: 137 mmol/L (ref 134–144)
Total Protein: 7.4 g/dL (ref 6.0–8.5)
eGFR: 54 mL/min/1.73 — ABNORMAL LOW (ref >59)

## 2024-03-16 MED ORDER — IRON (FERROUS SULFATE) 325 (65 FE) MG PO TABS
325.0000 mg | ORAL_TABLET | Freq: Every day | ORAL | 1 refills | Status: AC
  Filled 2024-03-16: qty 30, 30d supply, fill #0
  Filled 2024-09-13: qty 30, 30d supply, fill #1

## 2024-03-20 ENCOUNTER — Other Ambulatory Visit: Payer: Self-pay

## 2024-03-20 ENCOUNTER — Emergency Department (HOSPITAL_COMMUNITY)
Admission: EM | Admit: 2024-03-20 | Discharge: 2024-03-20 | Disposition: A | Discharge status: Home / Self Care | Attending: Emergency Medicine | Admitting: Emergency Medicine

## 2024-03-20 ENCOUNTER — Encounter (HOSPITAL_COMMUNITY): Payer: Self-pay

## 2024-03-20 DIAGNOSIS — R1084 Generalized abdominal pain: Secondary | ICD-10-CM | POA: Diagnosis not present

## 2024-03-20 DIAGNOSIS — T83098A Other mechanical complication of other indwelling urethral catheter, initial encounter: Secondary | ICD-10-CM | POA: Insufficient documentation

## 2024-03-20 DIAGNOSIS — Y732 Prosthetic and other implants, materials and accessory gastroenterology and urology devices associated with adverse incidents: Secondary | ICD-10-CM | POA: Diagnosis not present

## 2024-03-20 DIAGNOSIS — T839XXA Unspecified complication of genitourinary prosthetic device, implant and graft, initial encounter: Secondary | ICD-10-CM | POA: Diagnosis not present

## 2024-03-20 LAB — BASIC METABOLIC PANEL WITH GFR
Anion gap: 10 (ref 5–15)
BUN: 20 mg/dL (ref 8–23)
CO2: 21 mmol/L — ABNORMAL LOW (ref 22–32)
Calcium: 8.8 mg/dL — ABNORMAL LOW (ref 8.9–10.3)
Chloride: 107 mmol/L (ref 98–111)
Creatinine, Ser: 0.73 mg/dL (ref 0.44–1.00)
GFR, Estimated: >60 mL/min (ref >60)
Glucose, Bld: 81 mg/dL (ref 70–99)
Potassium: 4.4 mmol/L (ref 3.5–5.1)
Sodium: 138 mmol/L (ref 135–145)

## 2024-03-20 LAB — CBC WITH DIFFERENTIAL/PLATELET
Abs Immature Granulocytes: 0.04 K/uL (ref 0.00–0.07)
Basophils Absolute: 0 K/uL (ref 0.0–0.1)
Basophils Relative: 0 %
Eosinophils Absolute: 0.2 K/uL (ref 0.0–0.5)
Eosinophils Relative: 2 %
HCT: 29.9 % — ABNORMAL LOW (ref 36.0–46.0)
Hemoglobin: 9 g/dL — ABNORMAL LOW (ref 12.0–15.0)
Immature Granulocytes: 0 %
Lymphocytes Relative: 36 %
Lymphs Abs: 3.4 K/uL (ref 0.7–4.0)
MCH: 25.2 pg — ABNORMAL LOW (ref 26.0–34.0)
MCHC: 30.1 g/dL (ref 30.0–36.0)
MCV: 83.8 fL (ref 80.0–100.0)
Monocytes Absolute: 0.5 K/uL (ref 0.1–1.0)
Monocytes Relative: 5 %
Neutro Abs: 5.4 K/uL (ref 1.7–7.7)
Neutrophils Relative %: 57 %
Platelets: 372 K/uL (ref 150–400)
RBC: 3.57 MIL/uL — ABNORMAL LOW (ref 3.87–5.11)
RDW: 16.3 % — ABNORMAL HIGH (ref 11.5–15.5)
WBC: 9.5 K/uL (ref 4.0–10.5)
nRBC: 0 % (ref 0.0–0.2)

## 2024-03-20 LAB — URINALYSIS, ROUTINE W REFLEX MICROSCOPIC
Bilirubin Urine: NEGATIVE
Glucose, UA: 50 mg/dL — AB
Ketones, ur: NEGATIVE mg/dL
Nitrite: NEGATIVE
Protein, ur: 30 mg/dL — AB
Specific Gravity, Urine: 1.004 — ABNORMAL LOW (ref 1.005–1.030)
WBC, UA: >50 WBC/hpf (ref 0–5)
pH: 6 (ref 5.0–8.0)

## 2024-03-20 MED ORDER — SULFAMETHOXAZOLE-TRIMETHOPRIM 800-160 MG PO TABS
1.0000 | ORAL_TABLET | Freq: Two times a day (BID) | ORAL | 0 refills | Status: AC
  Filled 2024-03-20: qty 14, 7d supply, fill #0

## 2024-03-20 MED ORDER — SULFAMETHOXAZOLE-TRIMETHOPRIM 800-160 MG PO TABS
1.0000 | ORAL_TABLET | Freq: Once | ORAL | Status: AC
  Administered 2024-03-20 (×2): 1 via ORAL
  Filled 2024-03-20: qty 1

## 2024-03-20 NOTE — Discharge Instructions (Signed)
 Please follow-up with the urologist today.  Let them know that I continue your antibiotic for an additional 5 days.  Return to the ER for new or worsening symptoms.

## 2024-03-20 NOTE — ED Triage Notes (Signed)
 Pt comes from home via [facility] called out for urinary retention, pt has a urinary cath but no output in the last 6 hours, PTA received 100 mcg of fentanyl .

## 2024-03-20 NOTE — ED Provider Notes (Signed)
 [facility]-EMERGENCY DEPT [facility] Emergency Department Provider Note MRN:  [id number]  Arrival date & time: 03/20/24     Chief Complaint   No chief complaint on file.   History of Present Illness   Brooke [name] is a 63 y.o. year-old female presents to the ED with chief complaint of urinary retention.  Recent admission for AKI, pyelonephritis.  Has indwelling Foley catheter and stent.  States that she has not had any urine output for about the past 6 hours.  She reports significant suprapubic discomfort.  She was treated with 100 mcg fentanyl  by EMS.  History provided by patient.   Review of Systems  Pertinent positive and negative review of systems noted in HPI.    Physical Exam   Vitals:   03/20/24 0223 03/20/24 0430  BP: (!) 151/77 116/63  Pulse: 97 100  Resp: 18   Temp: 99 F (37.2 C)   SpO2: 100% 99%    CONSTITUTIONAL: Chronically ill-appearing, NAD NEURO:  Alert and oriented x 3, CN 3-12 grossly intact EYES:  eyes equal and reactive ENT/NECK:  Supple, no stridor CARDIO: Normal rate, regular rhythm, appears well-perfused  PULM:  No respiratory distress, clear to auscultation bilaterally GI/GU:  non-distended, suprapubic discomfort without focal tenderness MSK/SPINE:  No gross deformities, no edema, moves all extremities  SKIN:  no rash, atraumatic   *Additional and/or pertinent findings included in MDM below  Diagnostic and Interventional Summary    EKG Interpretation Date/Time:    Ventricular Rate:    PR Interval:    QRS Duration:    QT Interval:    QTC Calculation:   R Axis:      Text Interpretation:         Labs Reviewed  URINALYSIS, ROUTINE W REFLEX MICROSCOPIC - Abnormal; Notable for the following components:      Result Value   APPearance CLOUDY (*)    Specific Gravity, Urine 1.004 (*)    Glucose, UA 50 (*)    Hgb urine dipstick LARGE (*)    Protein, ur 30 (*)    Leukocytes,Ua LARGE (*)    Bacteria, UA FEW (*)    All other  components within normal limits  CBC WITH DIFFERENTIAL/PLATELET - Abnormal; Notable for the following components:   RBC 3.57 (*)    Hemoglobin 9.0 (*)    HCT 29.9 (*)    MCH 25.2 (*)    RDW 16.3 (*)    All other components within normal limits  BASIC METABOLIC PANEL WITH GFR - Abnormal; Notable for the following components:   CO2 21 (*)    Calcium  8.8 (*)    All other components within normal limits    No orders to display    Medications  sulfamethoxazole -trimethoprim  (BACTRIM  DS) 800-160 MG per tablet 1 tablet (1 tablet Oral Given 03/20/24 0458)     Procedures  /  Critical Care Procedures  ED Course and Medical Decision Making  I have reviewed the triage vital signs, the nursing notes, and pertinent available records from the EMR.  Social Determinants Affecting Complexity of Care: Patient has no clinically significant social determinants affecting this chief complaint..   ED Course:    Medical Decision Making Patient here with acute urinary retention.  She has not had any output from her urinary catheter for the past 6 hours.  She was recently admitted for obstructive uropathy requiring stenting by urology.  She still has the stent and catheter in place.  Will irrigate catheter.  If unsuccessful, will  need to replace.  Will check basic labs and repeat urinalysis.  Catheter was replaced and drained successfully.  Patient feels significantly improved.  Urinalysis still is abnormal, continue antibiotics.  Patient has follow-up with urology today.  Amount and/or Complexity of Data Reviewed Labs: ordered.  Risk Prescription drug management.         Consultants: No consultations were needed in caring for this patient.   Treatment and Plan: I considered admission due to patient's initial presentation, but after considering the examination and diagnostic results, patient will not require admission and can be discharged with outpatient follow-up.    Final Clinical  Impressions(s) / ED Diagnoses     ICD-10-CM   1. Problem with Foley catheter, initial encounter (HCC)  [email or web address]       ED Discharge Orders          Ordered    sulfamethoxazole -trimethoprim  (BACTRIM  DS) 800-160 MG tablet  2 times daily        03/20/24 0431              Discharge Instructions Discussed with and Provided to Patient:     Discharge Instructions      Please follow-up with the urologist today.  Let them know that I continue your antibiotic for an additional 5 days.  Return to the ER for new or worsening symptoms.       Vicky Charleston, PA-C 03/20/24 [id number]    Jerral Meth, MD 03/20/24 [phone]

## 2024-03-21 ENCOUNTER — Other Ambulatory Visit: Payer: Self-pay

## 2024-03-23 ENCOUNTER — Other Ambulatory Visit: Payer: Self-pay | Admitting: Urology

## 2024-03-23 ENCOUNTER — Telehealth: Payer: Self-pay | Admitting: Cardiology

## 2024-03-23 DIAGNOSIS — R338 Other retention of urine: Secondary | ICD-10-CM | POA: Diagnosis not present

## 2024-03-23 NOTE — Telephone Encounter (Signed)
   Pre-operative Risk Assessment    Patient Name: Brooke [name]  DOB: [birth year] MRN: [id number]   Date of last office visit: 10/11/2023 Date of next office visit: TBD   Request for Surgical Clearance    Procedure:  Cystoscopy right retrograde pyelogram/ right ureteroscopy possible biopsy and stent exchange   Date of Surgery:  Clearance 05/01/24                                Surgeon:  Dr. Selma Socks Group or Practice Name:  [facility]  Phone number:  [phone] [id number] Fax number:  [phone]   Type of Clearance Requested:   - Medical    Type of Anesthesia:  General    Additional requests/questions:    SignedBernarda JONETTA Fireman   03/23/2024, 9:32 AM

## 2024-03-24 NOTE — Telephone Encounter (Signed)
   Name: Brooke [name]  DOB: Jul 27, 1961  MRN: [id number]  Primary Cardiologist: Oneil Parchment, MD   Preoperative team, please contact this patient and set up a phone call appointment for further preoperative risk assessment. Please obtain consent and complete medication review. Thank you for your help.  I confirm that guidance regarding antiplatelet and oral anticoagulation therapy has been completed and, if necessary, noted below.  Per office protocol, if patient is without any new symptoms or concerns at the time of their virtual visit, she may hold Plavix  for 5 days and Aspirin  for 7 days, prior to procedure. Please resume Plavix  and Aspirin  as soon as possible postprocedure, at the discretion of the surgeon.    I also confirmed the patient resides in the state of [place] . As per [facility] Medical Board telemedicine laws, the patient must reside in the state in which the provider is licensed.   Lamarr Satterfield, NP 03/24/2024, 8:02 AM  [facility]

## 2024-03-24 NOTE — Telephone Encounter (Signed)
 1st attempt to reach pt regarding surgical clearance and the need for an TELE appointment.  VM box full

## 2024-03-27 NOTE — Telephone Encounter (Signed)
 VM is full

## 2024-03-27 NOTE — Telephone Encounter (Signed)
 2nd attempt to reach the pt to schedule a tele preop appt.   I will update the requesting office the pt needs a tele preop appt and will need to call the office [phone].

## 2024-03-28 ENCOUNTER — Encounter (HOSPITAL_COMMUNITY): Payer: Medicaid Other

## 2024-03-28 ENCOUNTER — Ambulatory Visit: Payer: Medicaid Other

## 2024-03-28 NOTE — Telephone Encounter (Signed)
 3rd attempt to reach pt to schedule a tele preop appt. Vm is full. Pt will need to call the office to schedule tele preop appt.

## 2024-03-31 ENCOUNTER — Encounter (HOSPITAL_COMMUNITY): Payer: Self-pay

## 2024-04-04 ENCOUNTER — Telehealth: Payer: Self-pay

## 2024-04-04 NOTE — Telephone Encounter (Signed)
  Patient Consent for Virtual Visit        Brooke [name] has provided verbal consent on 04/04/2024 for a virtual visit (video or telephone).   CONSENT FOR VIRTUAL VISIT FOR:  Brooke [name]  By participating in this virtual visit I agree to the following:  I hereby voluntarily request, consent and authorize [facility] and its employed or contracted physicians, physician assistants, nurse practitioners or other licensed health care professionals (the Practitioner), to provide me with telemedicine health care services (the "Services) as deemed necessary by the treating Practitioner. I acknowledge and consent to receive the Services by the Practitioner via telemedicine. I understand that the telemedicine visit will involve communicating with the Practitioner through live audiovisual communication technology and the disclosure of certain medical information by electronic transmission. I acknowledge that I have been given the opportunity to request an in-person assessment or other available alternative prior to the telemedicine visit and am voluntarily participating in the telemedicine visit.  I understand that I have the right to withhold or withdraw my consent to the use of telemedicine in the course of my care at any time, without affecting my right to future care or treatment, and that the Practitioner or I may terminate the telemedicine visit at any time. I understand that I have the right to inspect all information obtained and/or recorded in the course of the telemedicine visit and may receive copies of available information for a reasonable fee.  I understand that some of the potential risks of receiving the Services via telemedicine include:  Delay or interruption in medical evaluation due to technological equipment failure or disruption; Information transmitted may not be sufficient (e.g. poor resolution of images) to allow for appropriate medical decision making by the Practitioner;  and/or  In rare instances, security protocols could fail, causing a breach of personal health information.  Furthermore, I acknowledge that it is my responsibility to provide information about my medical history, conditions and care that is complete and accurate to the best of my ability. I acknowledge that Practitioner's advice, recommendations, and/or decision may be based on factors not within their control, such as incomplete or inaccurate data provided by me or distortions of diagnostic images or specimens that may result from electronic transmissions. I understand that the practice of medicine is not an exact science and that Practitioner makes no warranties or guarantees regarding treatment outcomes. I acknowledge that a copy of this consent can be made available to me via my patient portal [facility] MyChart), or I can request a printed copy by calling the office of [facility].    I understand that my insurance will be billed for this visit.   I have read or had this consent read to me. I understand the contents of this consent, which adequately explains the benefits and risks of the Services being provided via telemedicine.  I have been provided ample opportunity to ask questions regarding this consent and the Services and have had my questions answered to my satisfaction. I give my informed consent for the services to be provided through the use of telemedicine in my medical care

## 2024-04-04 NOTE — Telephone Encounter (Signed)
 Patient is returning call to schedule tele preop appointment.

## 2024-04-04 NOTE — Telephone Encounter (Signed)
   Pre-operative Risk Assessment    Patient Name: Brooke [name]  DOB: 06-Oct-1960 MRN: [id number]   Date of last office visit: 10/11/23 Date of next office visit: Televisit 04/25/24   Request for Surgical Clearance    Procedure:  L4-L5-ESI   Date of Surgery:  Clearance TBD                                 Surgeon:  Dr. Deatrice Manus  Surgeon's Group or Practice Name:  [facility]  Phone number:  [phone] Fax number:  [phone]   Type of Clearance Requested:   - Medical  - Pharmacy:  Hold Aspirin  and Clopidogrel  (Plavix ) Plavix  7 days prior and patient can resume day after    Type of Anesthesia:  Not Indicated   Additional requests/questions:    Bonney Rebeca Blight   04/04/2024, 11:28 AM

## 2024-04-04 NOTE — Telephone Encounter (Signed)
 Patient has 2 requests:  Cystoscopy scheduled 05/01/24 and ESI, date TBD. She has a virtual visit appointment on 04/25/24.  Would advise request to hold Plavix  be directed to vascular surgery given her lower extremity angioplasty in 07/2023.   From a cardiology perspective, she may hold Plavix  for 5 days prior to procedure. Ideally aspirin  should be continued without interruption, however if the bleeding risk is too great, aspirin  may be held for 5-7 days prior to surgery. Please resume aspirin  post operatively when it is felt to be safe from a bleeding standpoint.    Rosaline EMERSON Bane, NP-C  04/04/2024, 1:32 PM [address] [place], [place] [address] Office [phone] Fax [phone]

## 2024-04-04 NOTE — Telephone Encounter (Signed)
 Pt scheduled for VV on 04/25/24.

## 2024-04-18 ENCOUNTER — Encounter (HOSPITAL_COMMUNITY)

## 2024-04-18 ENCOUNTER — Ambulatory Visit

## 2024-04-19 ENCOUNTER — Other Ambulatory Visit: Payer: Self-pay

## 2024-04-19 DIAGNOSIS — N130 Hydronephrosis with ureteropelvic junction obstruction: Secondary | ICD-10-CM | POA: Diagnosis not present

## 2024-04-19 DIAGNOSIS — R338 Other retention of urine: Secondary | ICD-10-CM | POA: Diagnosis not present

## 2024-04-19 DIAGNOSIS — N312 Flaccid neuropathic bladder, not elsewhere classified: Secondary | ICD-10-CM | POA: Diagnosis not present

## 2024-04-19 MED ORDER — CEPHALEXIN 500 MG PO CAPS
500.0000 mg | ORAL_CAPSULE | Freq: Two times a day (BID) | ORAL | 0 refills | Status: DC
  Filled 2024-04-19: qty 14, 7d supply, fill #0

## 2024-04-19 NOTE — Patient Instructions (Signed)
 SURGICAL WAITING ROOM VISITATION  Patients having surgery or a procedure may have no more than 2 support people in the waiting area - these visitors may rotate.    Children under the age of 28 must have an adult with them who is not the patient.  Visitors with respiratory illnesses are discouraged from visiting and should remain at home.  If the patient needs to stay at the hospital during part of their recovery, the visitor guidelines for inpatient rooms apply. Pre-op nurse will coordinate an appropriate time for 1 support person to accompany patient in pre-op.  This support person may not rotate.    Please refer to the [facility] website for the visitor guidelines for Inpatients (after your surgery is over and you are in a regular room).       Your procedure is scheduled on: 05-01-24   Report to [facility] Main Entrance    Report to admitting at       1:45  PM   Call this number if you have problems the morning of surgery [phone]   Do not eat food :After Midnight.   After Midnight you may have the following liquids until _10:00_____ AM/  DAY OF SURGERY   then nothing by mouth  Water                                                         Sports drinks like Gatorade (NO RED)                           If you have questions, please contact your surgeon's office.   FOLLOW ANY ADDITIONAL PRE OP INSTRUCTIONS YOU RECEIVED FROM YOUR SURGEON'S OFFICE!!!     Oral Hygiene is also important to reduce your risk of infection.                                    Remember - BRUSH YOUR TEETH THE MORNING OF SURGERY WITH YOUR REGULAR TOOTHPASTE  DENTURES WILL BE REMOVED PRIOR TO SURGERY PLEASE DO NOT APPLY Poly grip OR ADHESIVES!!!   Do NOT smoke after Midnight   Stop all vitamins and herbal supplements 7 days before surgery.   Take these medicines the morning of surgery with A SIP OF WATER: metoprolol , atorvastatin   How to Manage Your Diabetes Before and After  Surgery  Why is it important to control my blood sugar before and after surgery? Improving blood sugar levels before and after surgery helps healing and can limit problems. A way of improving blood sugar control is eating a healthy diet by:  Eating less sugar and carbohydrates  Increasing activity/exercise  Talking with your doctor about reaching your blood sugar goals High blood sugars (greater than 180 mg/dL) can raise your risk of infections and slow your recovery, so you will need to focus on controlling your diabetes during the weeks before surgery. Make sure that the doctor who takes care of your diabetes knows about your planned surgery including the date and location.  How do I manage my blood sugar before surgery? Check your blood sugar at least 4 times a day, starting 2 days before surgery, to make sure that the level is  not too high or low. Check your blood sugar the morning of your surgery when you wake up and every 2 hours until you get to the Short Stay unit. If your blood sugar is less than 70 mg/dL, you will need to treat for low blood sugar: Do not take insulin . Treat a low blood sugar (less than 70 mg/dL) with  cup of clear juice (cranberry or apple), 4 glucose tablets, OR glucose gel. Recheck blood sugar in 15 minutes after treatment (to make sure it is greater than 70 mg/dL). If your blood sugar is not greater than 70 mg/dL on recheck, call [phone] for further instructions. Report your blood sugar to the short stay nurse when you get to Short Stay.  If you are admitted to the hospital after surgery: Your blood sugar will be checked by the staff and you will probably be given insulin  after surgery (instead of oral diabetes medicines) to make sure you have good blood sugar levels. The goal for blood sugar control after surgery is 80-180 mg/dL.   WHAT DO I DO ABOUT MY DIABETES MEDICATION?  Do not take oral diabetes medicines (pills) the morning of surgery.  THE  NIGHT BEFORE SURGERY, take 50% of your normal  insulin  glargine (lantus ) dose          THE MORNING OF SURGERY, take   50% of your normal insulin  glargine  (lantus  )  dose.  DO NOT TAKE THE FOLLOWING 7 DAYS PRIOR TO SURGERY: Ozempic, Wegovy, Rybelsus (Semaglutide), Byetta (exenatide), Bydureon (exenatide ER), Victoza, Saxenda (liraglutide), or Trulicity (dulaglutide) Mounjaro (Tirzepatide) Adlyxin (Lixisenatide), Polyethylene Glycol Loxenatide.  If your CBG is greater than 220 mg/dL, you may take  of your sliding scale  (correction) dose of insulin .     DO NOT TAKE ANY ORAL DIABETIC MEDICATIONS DAY OF YOUR SURGERY  Bring CPAP mask and tubing day of surgery.                              You may not have any metal on your body including hair pins, jewelry, and body piercing             Do not wear make-up, lotions, powders, perfumes/cologne, or deodorant  Do not wear nail polish including gel and S&S, artificial/acrylic nails, or any other type of covering on natural nails including finger and toenails. If you have artificial nails, gel coating, etc. that needs to be removed by a nail salon please have this removed prior to surgery or surgery may need to be canceled/ delayed if the surgeon/ anesthesia feels like they are unable to be safely monitored.   Do not shave  48 hours prior to surgery.         Do not bring valuables to the hospital. [facility] IS NOT             RESPONSIBLE   FOR VALUABLES.   Contacts, glasses, dentures or bridgework may not be worn into surgery.   Bring small overnight bag day of surgery.   DO NOT BRING YOUR HOME MEDICATIONS TO THE HOSPITAL. PHARMACY WILL DISPENSE MEDICATIONS LISTED ON YOUR MEDICATION LIST TO YOU DURING YOUR ADMISSION IN THE HOSPITAL!    Patients discharged on the day of surgery will not be allowed to drive home.  Someone NEEDS to stay with you for the first 24 hours after anesthesia.   Special Instructions: Bring a copy of your  healthcare power of attorney  and living will documents the day of surgery if you haven't scanned them before.              Please read over the following fact sheets you were given: IF YOU HAVE QUESTIONS ABOUT YOUR PRE-OP INSTRUCTIONS PLEASE CALL [phone].    If you test positive for Covid or have been in contact with anyone that has tested positive in the last 10 days please notify you surgeon.    [facility] - Preparing for Surgery Before surgery, you can play an important role.  Because skin is not sterile, your skin needs to be as free of germs as possible.  You can reduce the number of germs on your skin by washing with CHG (chlorahexidine gluconate) soap before surgery.  CHG is an antiseptic cleaner which kills germs and bonds with the skin to continue killing germs even after washing. Please DO NOT use if you have an allergy to CHG or antibacterial soaps.  If your skin becomes reddened/irritated stop using the CHG and inform your nurse when you arrive at Short Stay. Do not shave (including legs and underarms) for at least 48 hours prior to the first CHG shower.  You may shave your face/neck. Please follow these instructions carefully:  1.  Shower with CHG Soap the night before surgery and the  morning of Surgery.  2.  If you choose to wash your hair, wash your hair first as usual with your  normal  shampoo.  3.  After you shampoo, rinse your hair and body thoroughly to remove the  shampoo.                           4.  Use CHG as you would any other liquid soap.  You can apply chg directly  to the skin and wash                       Gently with a scrungie or clean washcloth.  5.  Apply the CHG Soap to your body ONLY FROM THE NECK DOWN.   Do not use on face/ open                           Wound or open sores. Avoid contact with eyes, ears mouth and genitals (private parts).                       Wash face,  Genitals (private parts) with your normal soap.             6.  Wash thoroughly,  paying special attention to the area where your surgery  will be performed.  7.  Thoroughly rinse your body with warm water from the neck down.  8.  DO NOT shower/wash with your normal soap after using and rinsing off  the CHG Soap.                9.  Pat yourself dry with a clean towel.            10.  Wear clean pajamas.            11.  Place clean sheets on your bed the night of your first shower and do not  sleep with pets. Day of Surgery : Do not apply any lotions/deodorants the morning of surgery.  Please wear clean clothes to the hospital/surgery  center.  FAILURE TO FOLLOW THESE INSTRUCTIONS MAY RESULT IN THE CANCELLATION OF YOUR SURGERY PATIENT SIGNATURE_________________________________  NURSE SIGNATURE__________________________________  ________________________________________________________________________

## 2024-04-19 NOTE — Progress Notes (Addendum)
 PCP - Dr. Franchester Bing Cardiologist - Rosaline Bane virtual appt. 04-25-24/ for preop eval /Dr. Jeffrie Primary  Vascular LOV 09-14-23 Armstead Damme  PA-C  epic  PPM/ICD -  Device Orders -  Rep Notified -   Chest x-ray - 03-02-24 epic EKG - 12-27-23  epic Stress Test -  ECHO - 2023 epic Cardiac Cath - 2023 epic  Sleep Study - n/a CPAP - n/a  Fasting Blood Sugar - 130-140 Checks Blood Sugar __Continous CBG DEXCOM___   Blood Thinner Instructions:Plavix  hold 5 days last dose of Plavix  and asa will be 04-25-24 Aspirin  Instructions:asa  81mg   5-days  ERAS Protcol - PRE-SURGERY n/a    COVID vaccine -no  Activity--Able to climb a flight of stairs with no CP or SOB Anesthesia review: LE angioplasty 07-2023,CHF, Carotid artert disease, DM2, PAD, CBG 288 at preop [facility] PA-C aware, Pt. had not taken insulin  prior to preop. Evaluate DOS. HX.  heart murmur echo in epic. Hgb 9.1, HgbA1c 10.7  Patient denies shortness of breath, fever, cough and chest pain at PAT appointment   All instructions explained to the patient, with a verbal understanding of the material. Patient agrees to go over the instructions while at home for a better understanding. Patient also instructed to self quarantine after being tested for COVID-19. The opportunity to ask questions was provided.

## 2024-04-20 ENCOUNTER — Encounter (HOSPITAL_COMMUNITY): Payer: Self-pay

## 2024-04-20 ENCOUNTER — Encounter (HOSPITAL_COMMUNITY)
Admission: RE | Admit: 2024-04-20 | Discharge: 2024-04-20 | Disposition: A | Discharge status: Home / Self Care | Source: Ambulatory Visit | Attending: Urology | Admitting: Urology

## 2024-04-20 ENCOUNTER — Other Ambulatory Visit: Payer: Self-pay

## 2024-04-20 VITALS — BP 113/66 | HR 84 | Temp 98.5°F | Resp 16 | Ht 64.5 in | Wt 107.0 lb

## 2024-04-20 DIAGNOSIS — Z01818 Encounter for other preprocedural examination: Secondary | ICD-10-CM | POA: Diagnosis present

## 2024-04-20 DIAGNOSIS — E119 Type 2 diabetes mellitus without complications: Secondary | ICD-10-CM | POA: Diagnosis not present

## 2024-04-20 DIAGNOSIS — Z01812 Encounter for preprocedural laboratory examination: Secondary | ICD-10-CM | POA: Insufficient documentation

## 2024-04-20 HISTORY — DX: Anemia, unspecified: D64.9

## 2024-04-20 HISTORY — DX: Cardiac murmur, unspecified: R01.1

## 2024-04-20 HISTORY — DX: Gastro-esophageal reflux disease without esophagitis: K21.9

## 2024-04-20 LAB — CBC
HCT: 29 % — ABNORMAL LOW (ref 36.0–46.0)
Hemoglobin: 9.1 g/dL — ABNORMAL LOW (ref 12.0–15.0)
MCH: 25.7 pg — ABNORMAL LOW (ref 26.0–34.0)
MCHC: 31.4 g/dL (ref 30.0–36.0)
MCV: 81.9 fL (ref 80.0–100.0)
Platelets: 361 K/uL (ref 150–400)
RBC: 3.54 MIL/uL — ABNORMAL LOW (ref 3.87–5.11)
RDW: 15.1 % (ref 11.5–15.5)
WBC: 9.4 K/uL (ref 4.0–10.5)
nRBC: 0 % (ref 0.0–0.2)

## 2024-04-20 LAB — BASIC METABOLIC PANEL WITH GFR
Anion gap: 12 (ref 5–15)
BUN: 11 mg/dL (ref 8–23)
CO2: 24 mmol/L (ref 22–32)
Calcium: 9.6 mg/dL (ref 8.9–10.3)
Chloride: 100 mmol/L (ref 98–111)
Creatinine, Ser: 0.81 mg/dL (ref 0.44–1.00)
GFR, Estimated: >60 mL/min (ref >60)
Glucose, Bld: 329 mg/dL — ABNORMAL HIGH (ref 70–99)
Potassium: 3.8 mmol/L (ref 3.5–5.1)
Sodium: 137 mmol/L (ref 135–145)

## 2024-04-20 LAB — GLUCOSE, CAPILLARY: Glucose-Capillary: 288 mg/dL — ABNORMAL HIGH (ref 70–99)

## 2024-04-20 LAB — HEMOGLOBIN A1C
Hgb A1c MFr Bld: 10.7 % — ABNORMAL HIGH (ref 4.8–5.6)
Mean Plasma Glucose: 260.39 mg/dL

## 2024-04-21 ENCOUNTER — Other Ambulatory Visit: Payer: Self-pay

## 2024-04-25 ENCOUNTER — Ambulatory Visit: Attending: Cardiology

## 2024-04-25 DIAGNOSIS — Z01810 Encounter for preprocedural cardiovascular examination: Secondary | ICD-10-CM | POA: Diagnosis not present

## 2024-04-25 NOTE — Progress Notes (Signed)
 Virtual Visit via Telephone Note   Because of Brooke [name] co-morbid illnesses, she is at least at moderate risk for complications without adequate follow up.  This format is felt to be most appropriate for this patient at this time.  Due to technical limitations with video connection (technology), today's appointment will be conducted as an audio only telehealth visit, and Brooke [name] verbally agreed to proceed in this manner.   All issues noted in this document were discussed and addressed.  No physical exam could be performed with this format.  Evaluation Performed:  Preoperative cardiovascular risk assessment _____________   Date:  04/25/2024   Patient ID:  Brooke [name], DOB 1960-09-07, MRN [id number] Patient Location:  Home Provider location:   Office  Primary Care Provider:  [name], MD Primary Cardiologist:  Oneil Parchment, MD  Chief Complaint / Patient Profile  63 y.o. y/o female with a h/o coronary artery disease, chronic HFrEF, esophageal dysphagia, hyperlipidemia, type 2 diabetes mellitus, PAD s/p angioplasty to left lower extremity in 07/2023 who is pending cystoscopy right retrograde pyelogram/right ureteroscopy with possible biopsy and stent exchange with Dr. Selma on 05/01/24 and L4-L5 ESI with Dr. Deatrice Manus and presents today for telephonic preoperative cardiovascular risk assessment. History of Present Illness  Brooke [name] is a 63 y.o. female who presents via audio/video conferencing for a telehealth visit today.  Pt was last seen in cardiology clinic on 10/11/23 by Rosaline Bane, NP.  At that time Brooke [name] was doing well.  The patient is now pending procedure as outlined above. Since her last visit, she has remained stable for cardiac standpoint. Today she denies chest pain, shortness of breath, fatigue, palpitations, melena, hematuria, hemoptysis, diaphoresis, weakness, presyncope, syncope, orthopnea, and PND.  Patient notes that she did have some minor  right lower extremity swelling relating to a mechanical fall and possibly spraining her ankle, reports this has improved.  She is able to achieve greater than 4 METS of activity with household chores, ADLs, walking and regularly climbing stairs. Past Medical History    Past Medical History:  Diagnosis Date   Abdominal pain, left lower quadrant 12/17/2015   Anemia    Anxiety 07/01/2016   Carotid artery disease (HCC) 10/27/2022   Chest pain, unspecified 10/05/2003   CHF (congestive heart failure) (HCC)    Chronic systolic heart failure (HCC) 02/20/2022   Depression, major, recurrent, moderate (HCC) 07/31/2005   Diabetes mellitus without complication (HCC) 09/08/2006   Endometriosis 03/28/1996   GERD (gastroesophageal reflux disease)    Heart murmur    History of colonic polyps 08/07/2015   20 mm removed 05/10/15 by Dr. Kristie     PAD (peripheral artery disease) (HCC)    Unspecified constipation 06/24/2004   Past Surgical History:  Procedure Laterality Date   ABDOMINAL AORTOGRAM W/LOWER EXTREMITY Left 07/29/2023   Procedure: ABDOMINAL AORTOGRAM W/LOWER EXTREMITY;  Surgeon: Gretta Lonni PARAS, MD;  Location: [facility] INVASIVE CV LAB;  Service: Cardiovascular;  Laterality: Left;   APPENDECTOMY  08/10/1994   Dr. Buford   BREAST SURGERY Bilateral 08/10/1994   Dr. Ardeen   calcifications removed   CYSTOSCOPY W/ URETERAL STENT PLACEMENT Right 03/02/2024   Procedure: CYSTOSCOPY, WITH RETROGRADE PYELOGRAM AND URETERAL STENT INSERTION;  Surgeon: Selma Donnice SAUNDERS, MD;  Location: [facility] OR;  Service: Urology;  Laterality: Right;   ESOPHAGEAL MANOMETRY N/A 03/23/2023   Procedure: ESOPHAGEAL MANOMETRY (EM);  Surgeon: Leigh Elspeth SQUIBB, MD;  Location: [facility];  Service: Gastroenterology;  Laterality: N/A;   INGUINAL HERNIA  REPAIR     OVARIAN CYST REMOVAL     PERIPHERAL VASCULAR BALLOON ANGIOPLASTY Left 07/29/2023   Procedure: PERIPHERAL VASCULAR BALLOON ANGIOPLASTY;  Surgeon: Gretta Lonni PARAS,  MD;  Location: [facility] INVASIVE CV LAB;  Service: Cardiovascular;  Laterality: Left;   RIGHT/LEFT HEART CATH AND CORONARY ANGIOGRAPHY N/A 10/09/2021   Procedure: RIGHT/LEFT HEART CATH AND CORONARY ANGIOGRAPHY;  Surgeon: Wendel Lurena POUR, MD;  Location: [facility] INVASIVE CV LAB;  Service: Cardiovascular;  Laterality: N/A;   TOOTH EXTRACTION N/A 07/28/2022   Procedure: EXTRACTION ALL REMAING TEETH THREE, FOUR, FIVE, SIX, SEVEN, EIGHT, NINE, TEN, ELEVEN, TWELVE, TWENTY ONE, TWENTY TWO, TWENTY THREE, TWENTY FOUR, TWENTY FIVE, TWENTY SIX, TWENTY SEVEN, TWENTY EIGHT, TWENTY NINE, THIRTY, AVEOLOPLASTY;  Surgeon: Sheryle Hamilton, DMD;  Location: [facility] OR;  Service: Oral Surgery;  Laterality: N/A;    Allergies  Allergies  Allergen Reactions   Glucophage  [Metformin ] Diarrhea, Nausea Only and Other (See Comments)    General GI intolerance    Bayer Aspirin  [Aspirin ] Other (See Comments)    Stomach pain occurs with 325mg  dose, able to tolerate 81mg .   Darvocet [Propoxyphene N-Acetaminophen ] Rash   Darvon-N [Propoxyphene] Rash   Ery-Tab [Erythromycin] Nausea And Vomiting   Motrin [Ibuprofen] Other (See Comments)    Stomach pain   Neurontin [Gabapentin] Other (See Comments)    Suicidal ideations     Home Medications    Prior to Admission medications   Medication Sig Start Date End Date Taking? Authorizing Provider  acetaminophen  (TYLENOL ) 500 MG tablet Take 1,000 mg by mouth every 6 (six) hours as needed for headache or fever (pain).    [provider]  aspirin  81 MG chewable tablet Chew 1 tablet (81 mg total) by mouth daily. Patient taking differently: Chew 81 mg by mouth at bedtime. 12/15/21   Bensimhon, Toribio SAUNDERS, MD  atorvastatin  (LIPITOR ) 80 MG tablet Take 1 tablet (80 mg total) by mouth daily. Patient taking differently: Take 80 mg by mouth at bedtime. 03/15/24   Newlin, Enobong, MD  cephALEXin  (KEFLEX ) 500 MG capsule Take 1 capsule (500 mg total) by mouth 2 (two) times daily. Take this prescription  starting on 04/24/2024 Patient not taking: Reported on 04/20/2024 04/19/24     clopidogrel  (PLAVIX ) 75 MG tablet Take 1 tablet (75 mg total) by mouth daily. 03/13/24   Newlin, Enobong, MD  Continuous Glucose Receiver (DEXCOM G7 RECEIVER) DEVI Apply as directed. 03/05/24   Danford, Lonni SQUIBB, MD  Continuous Glucose Sensor (DEXCOM G7 SENSOR) MISC Use to check blood sugar 3 times daily. 12/11/22   Newlin, Enobong, MD  Glucose Blood (BLOOD GLUCOSE TEST STRIPS) STRP 1 each by Does not apply route 3 (three) times daily. Use as directed to check blood sugar. May dispense any manufacturer covered by patient's insurance and fits patient's device. 03/05/24   Danford, Lonni SQUIBB, MD  insulin  aspart (NOVOLOG ) 100 UNIT/ML FlexPen Inject 3-14 Units into the skin 3 (three) times daily with meals. If eating and Blood Glucose (BG) 80 or higher inject 3 units for meal coverage and add correction dose per scale. If not eating, correction dose only. BG <150= 0 unit; BG 150-200= 1 unit; BG 201-250= 3 unit; BG 251-300= 5 unit; BG 301-350= 7 unit; BG 351-400= 9 unit; BG >400= 11 unit and Call Primary Care. 03/05/24   Danford, Lonni SQUIBB, MD  insulin  glargine (LANTUS ) 100 UNIT/ML Solostar Pen Inject 20 Units into the skin daily. Patient taking differently: Inject 18-20 Units into the skin See admin instructions. Inject 18u  three times daily with meals and 20u at bedtime. 03/15/24   Newlin, Enobong, MD  Insulin  Pen Needle (PEN NEEDLES) 31G X 5 MM MISC Use 3 times a day. 03/05/24   Danford, Lonni SQUIBB, MD  Iron , Ferrous Sulfate , 325 (65 Fe) MG TABS Take 325 mg by mouth daily. 03/16/24   Newlin, Enobong, MD  Lancet Device MISC 1 each by Does not apply route 3 (three) times daily. May dispense any manufacturer covered by patient's insurance. 03/05/24   Danford, Lonni SQUIBB, MD  metoprolol  succinate (TOPROL -XL) 25 MG 24 hr tablet Take 0.5 tablets (12.5 mg total) by mouth daily. Take with or immediately following a meal. 03/15/24    Newlin, Enobong, MD  oxyCODONE -acetaminophen  (PERCOCET) 5-325 MG tablet Take 1 tablet by mouth every 4 (four) hours as needed for severe pain (pain score 7-10). 03/05/24 03/05/25  Danford, Lonni SQUIBB, MD  sacubitril -valsartan  (ENTRESTO ) 49-51 MG Take 1 tablet by mouth 2 (two) times daily. 05/06/23   Newlin, Enobong, MD  spironolactone  (ALDACTONE ) 25 MG tablet Take 1 tablet (25 mg total) by mouth daily. Patient not taking: Reported on 04/20/2024 11/17/22   Newlin, Enobong, MD  vitamin B-12 (CYANOCOBALAMIN) 1000 MCG tablet Take 3,000 mcg by mouth daily.    [provider]    Physical Exam  Vital Signs:  Brooke [name] does not have vital signs available for review today. Given telephonic nature of communication, physical exam is limited. AAOx3. NAD. Normal affect.  Speech and respirations are unlabored. Accessory Clinical Findings  None Assessment & Plan    1.  Preoperative Cardiovascular Risk Assessment: Brooke [name] perioperative risk of a major cardiac event is 11% according to the Revised Cardiac Risk Index (RCRI).  Therefore, she is at high risk for perioperative complications.   Her functional capacity is fair at 5.62 METs according to the Duke Activity Status Index (DASI). Recommendations: According to ACC/AHA guidelines, no further cardiovascular testing needed.  The patient may proceed to surgery at acceptable risk.   Antiplatelet and/or Anticoagulation Recommendations: Recommend request to hold Plavix  be directed to vascular surgery as well given her lower extremity angioplasty in 07/2023.    From a cardiology perspective, she may hold Plavix  for 5 days prior to procedure. Ideally aspirin  should be continued without interruption, however if the bleeding risk is too great, aspirin  may be held for 5-7 days prior to surgery. Please resume aspirin  post operatively when it is felt to be safe from a bleeding standpoint.   The patient was advised that if she develops new symptoms  prior to surgery to contact our office to arrange for a follow-up visit, and she verbalized understanding.  A copy of this note will be routed to requesting surgeon.  Time:   Today, I have spent 11 minutes with the patient with telehealth technology discussing medical history, symptoms, and management plan.    Patryk Conant D Aubreigh Fuerte, NP  04/25/2024, 12:08 PM

## 2024-04-26 NOTE — Progress Notes (Signed)
 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 never smoker with h/o DM II, chronic HFrEF, PAD s/p angioplasty to left lower extremity in 07/2023, right hydronephrosis scheduled for above procedure 05/01/24 with Dr. Donnice Selma.   A1C 10.7, this is decreased from 14.7 in July. Glucose 288 at PAT visit, pr reported she had not taken diabetes medications and had eaten before arrival. Evaluate DOS.   Pt last seen by vascular surgery 09/14/2023. Duplex with patent flow throughout LLE. There is some elevated velocities in the left SFA. This area was seen on recent angiogram and was about 50% stenosis and not flow limiting. No intervention indicated at this time. 6 month follow up recommended.   Per cardiology preoperative evaluation 04/25/2024, Ms. Mayorga's perioperative risk of a major cardiac event is 11% according to the Revised Cardiac Risk Index (RCRI).  Therefore, she is at high risk for perioperative complications.   Her functional capacity is fair at 5.62 METs according to the Duke Activity Status Index (DASI). Recommendations: According to ACC/AHA guidelines, no further cardiovascular testing needed.  The patient may proceed to surgery at acceptable risk.   Antiplatelet and/or Anticoagulation Recommendations: Recommend request to hold Plavix  be directed to vascular surgery as well given her lower extremity angioplasty in 07/2023. From a cardiology perspective, she may hold Plavix  for 5 days prior to procedure. Ideally aspirin  should be continued without  interruption, however if the bleeding risk is too great, aspirin  may be held for 5-7 days prior to surgery. Please resume aspirin  post operatively when it is felt to be safe from a bleeding standpoint.    VS: BP 113/66   Pulse 84   Temp 36.9 C (Oral)   Resp 16   Ht 5' 4.5 (1.638 m)   Wt 48.5 kg   LMP 11/16/2014 (Approximate)   SpO2 100%   BMI 18.08 kg/m   PROVIDERS: [name], MD is PCP    LABS: Labs reviewed: Acceptable for surgery. (all labs ordered are listed, but only abnormal results are displayed)  Labs Reviewed  HEMOGLOBIN A1C - Abnormal; Notable for the following components:      Result Value   Hgb A1c MFr Bld 10.7 (*)    All other components within normal limits  BASIC METABOLIC PANEL WITH GFR - Abnormal; Notable for the following components:   Glucose, Bld 329 (*)    All other components within normal limits  CBC - Abnormal; Notable for the following components:   RBC 3.54 (*)    Hemoglobin 9.1 (*)    HCT 29.0 (*)    MCH 25.7 (*)    All other components within normal limits  GLUCOSE, CAPILLARY - Abnormal; Notable for the following components:   Glucose-Capillary 288 (*)    All other components within normal limits     IMAGES:   EKG:   CV: Echo 05/01/2022  1. EF significantly improved since TTE done 10/09/21. Left ventricular  ejection fraction, by estimation, is 55 to 60%. The left ventricle has  normal function. The left ventricle has no regional wall  motion  abnormalities. Left ventricular diastolic  parameters are consistent with Grade I diastolic dysfunction (impaired  relaxation).   2. Right ventricular systolic function is normal. The right ventricular  size is normal.   3. The mitral valve is abnormal. No evidence of mitral valve  regurgitation. No evidence of mitral stenosis. Moderate mitral annular  calcification.   4. The aortic valve was not well visualized. Aortic valve regurgitation  is not visualized. No aortic stenosis is  present.   5. The inferior vena cava is normal in size with greater than 50%  respiratory variability, suggesting right atrial pressure of 3 mmHg.  Past Medical History:  Diagnosis Date   Abdominal pain, left lower quadrant 12/17/2015   Anemia    Anxiety 07/01/2016   Carotid artery disease (HCC) 10/27/2022   Chest pain, unspecified 10/05/2003   CHF (congestive heart failure) (HCC)    Chronic systolic heart failure (HCC) 02/20/2022   Depression, major, recurrent, moderate (HCC) 07/31/2005   Diabetes mellitus without complication (HCC) 09/08/2006   Endometriosis 03/28/1996   GERD (gastroesophageal reflux disease)    Heart murmur    History of colonic polyps 08/07/2015   20 mm removed 05/10/15 by Dr. Kristie     PAD (peripheral artery disease) [facility])    Unspecified constipation 06/24/2004    Past Surgical History:  Procedure Laterality Date   ABDOMINAL AORTOGRAM W/LOWER EXTREMITY Left 07/29/2023   Procedure: ABDOMINAL AORTOGRAM W/LOWER EXTREMITY;  Surgeon: Gretta Lonni PARAS, MD;  Location: [facility] INVASIVE CV LAB;  Service: Cardiovascular;  Laterality: Left;   APPENDECTOMY  08/10/1994   Dr. Buford   BREAST SURGERY Bilateral 08/10/1994   Dr. Ardeen   calcifications removed   CYSTOSCOPY W/ URETERAL STENT PLACEMENT Right 03/02/2024   Procedure: CYSTOSCOPY, WITH RETROGRADE PYELOGRAM AND URETERAL STENT INSERTION;  Surgeon: Selma Donnice SAUNDERS, MD;  Location: [facility] OR;  Service: Urology;  Laterality: Right;   ESOPHAGEAL MANOMETRY N/A 03/23/2023   Procedure: ESOPHAGEAL MANOMETRY (EM);  Surgeon: Leigh Elspeth SQUIBB, MD;  Location: [facility];  Service: Gastroenterology;  Laterality: N/A;   INGUINAL HERNIA REPAIR     OVARIAN CYST REMOVAL     PERIPHERAL VASCULAR BALLOON ANGIOPLASTY Left 07/29/2023   Procedure: PERIPHERAL VASCULAR BALLOON ANGIOPLASTY;  Surgeon: Gretta Lonni PARAS, MD;  Location: [facility] INVASIVE CV LAB;  Service: Cardiovascular;  Laterality: Left;   RIGHT/LEFT HEART CATH AND CORONARY  ANGIOGRAPHY N/A 10/09/2021   Procedure: RIGHT/LEFT HEART CATH AND CORONARY ANGIOGRAPHY;  Surgeon: Wendel Lurena POUR, MD;  Location: [facility] INVASIVE CV LAB;  Service: Cardiovascular;  Laterality: N/A;   TOOTH EXTRACTION N/A 07/28/2022   Procedure: EXTRACTION ALL REMAING TEETH THREE, FOUR, FIVE, SIX, SEVEN, EIGHT, NINE, TEN, ELEVEN, TWELVE, TWENTY ONE, TWENTY TWO, TWENTY THREE, TWENTY FOUR, TWENTY FIVE, TWENTY SIX, TWENTY SEVEN, TWENTY EIGHT, TWENTY NINE, THIRTY, AVEOLOPLASTY;  Surgeon: Sheryle Hamilton, DMD;  Location: [facility] OR;  Service: Oral Surgery;  Laterality: N/A;    MEDICATIONS:  acetaminophen  (TYLENOL ) 500 MG tablet   aspirin  81 MG chewable tablet   atorvastatin  (LIPITOR ) 80 MG tablet   cephALEXin  (KEFLEX ) 500 MG capsule   clopidogrel  (PLAVIX ) 75 MG tablet   Continuous Glucose Receiver (DEXCOM G7 RECEIVER) DEVI   Continuous Glucose Sensor (DEXCOM G7 SENSOR) MISC   Glucose Blood (BLOOD GLUCOSE TEST STRIPS) STRP   insulin  aspart (NOVOLOG ) 100 UNIT/ML FlexPen   insulin  glargine (LANTUS ) 100 UNIT/ML Solostar Pen   Insulin  Pen Needle (PEN NEEDLES) 31G X 5 MM MISC   Iron , Ferrous Sulfate , 325 (65 Fe) MG TABS  Lancet Device MISC   metoprolol  succinate (TOPROL -XL) 25 MG 24 hr tablet   oxyCODONE -acetaminophen  (PERCOCET) 5-325 MG tablet   sacubitril -valsartan  (ENTRESTO ) 49-51 MG   [Paused] spironolactone  (ALDACTONE ) 25 MG tablet   vitamin B-12 (CYANOCOBALAMIN) 1000 MCG tablet   No current facility-administered medications for this encounter.    Harlene Hoots Ward, PA-C [facility] [phone]

## 2024-04-26 NOTE — Anesthesia Preprocedure Evaluation (Addendum)
 Anesthesia Evaluation  Patient identified by MRN, date of birth, ID band Patient awake    Reviewed: Allergy & Precautions, NPO status , Patient's Chart, lab work & pertinent test results, reviewed documented beta blocker date and time   History of Anesthesia Complications Negative for: history of anesthetic complications  Airway Mallampati: II  TM Distance: >3 FB     Dental no notable dental hx.    Pulmonary neg COPD   breath sounds clear to auscultation       Cardiovascular hypertension, + Peripheral Vascular Disease and +CHF  + Valvular Problems/Murmurs  Rhythm:Regular Rate:Normal     Neuro/Psych neg Seizures PSYCHIATRIC DISORDERS Anxiety Depression     Neuromuscular disease    GI/Hepatic ,GERD  ,,  Endo/Other  diabetes    Renal/GU Renal disease     Musculoskeletal   Abdominal   Peds  Hematology  (+) Blood dyscrasia, anemia   Anesthesia Other Findings   Reproductive/Obstetrics                              Anesthesia Physical Anesthesia Plan  ASA: 3  Anesthesia Plan: General   Post-op Pain Management:    Induction: Intravenous  PONV Risk Score and Plan: 2 and Ondansetron  and Dexamethasone   Airway Management Planned: LMA  Additional Equipment:   Intra-op Plan:   Post-operative Plan: Extubation in OR  Informed Consent: I have reviewed the patients History and Physical, chart, labs and discussed the procedure including the risks, benefits and alternatives for the proposed anesthesia with the patient or authorized representative who has indicated his/her understanding and acceptance.     Dental advisory given  Plan Discussed with: CRNA  Anesthesia Plan Comments: (See PAT note 04/20/24)         Anesthesia Quick Evaluation

## 2024-05-01 ENCOUNTER — Ambulatory Visit (HOSPITAL_BASED_OUTPATIENT_CLINIC_OR_DEPARTMENT_OTHER): Admitting: Anesthesiology

## 2024-05-01 ENCOUNTER — Ambulatory Visit (HOSPITAL_COMMUNITY)

## 2024-05-01 ENCOUNTER — Encounter (HOSPITAL_COMMUNITY)
Admission: RE | Disposition: A | Discharge status: Home / Self Care | Payer: Self-pay | Source: Home / Self Care | Attending: Urology

## 2024-05-01 ENCOUNTER — Ambulatory Visit (HOSPITAL_COMMUNITY)
Admission: RE | Admit: 2024-05-01 | Discharge: 2024-05-01 | Disposition: A | Discharge status: Home / Self Care | Attending: Urology | Admitting: Urology

## 2024-05-01 ENCOUNTER — Encounter (HOSPITAL_COMMUNITY): Payer: Self-pay | Admitting: Urology

## 2024-05-01 ENCOUNTER — Ambulatory Visit (HOSPITAL_COMMUNITY): Payer: Self-pay | Admitting: Physician Assistant

## 2024-05-01 DIAGNOSIS — N1330 Unspecified hydronephrosis: Secondary | ICD-10-CM

## 2024-05-01 DIAGNOSIS — I110 Hypertensive heart disease with heart failure: Secondary | ICD-10-CM | POA: Diagnosis not present

## 2024-05-01 DIAGNOSIS — F419 Anxiety disorder, unspecified: Secondary | ICD-10-CM | POA: Diagnosis not present

## 2024-05-01 DIAGNOSIS — I5022 Chronic systolic (congestive) heart failure: Secondary | ICD-10-CM

## 2024-05-01 DIAGNOSIS — N136 Pyonephrosis: Secondary | ICD-10-CM | POA: Insufficient documentation

## 2024-05-01 DIAGNOSIS — Z7984 Long term (current) use of oral hypoglycemic drugs: Secondary | ICD-10-CM | POA: Insufficient documentation

## 2024-05-01 DIAGNOSIS — A312 Disseminated mycobacterium avium-intracellulare complex (DMAC): Secondary | ICD-10-CM | POA: Diagnosis not present

## 2024-05-01 DIAGNOSIS — E119 Type 2 diabetes mellitus without complications: Secondary | ICD-10-CM

## 2024-05-01 DIAGNOSIS — N130 Hydronephrosis with ureteropelvic junction obstruction: Secondary | ICD-10-CM | POA: Diagnosis present

## 2024-05-01 DIAGNOSIS — E1151 Type 2 diabetes mellitus with diabetic peripheral angiopathy without gangrene: Secondary | ICD-10-CM | POA: Diagnosis not present

## 2024-05-01 HISTORY — PX: CYSTOSCOPY WITH BIOPSY: SHX5122

## 2024-05-01 HISTORY — PX: CYSTOSCOPY W/ RETROGRADES: SHX1426

## 2024-05-01 HISTORY — PX: CYSTOSCOPY/URETEROSCOPY/HOLMIUM LASER/STENT PLACEMENT: SHX6546

## 2024-05-01 LAB — GLUCOSE, CAPILLARY
Glucose-Capillary: 28 mg/dL — CL (ref 70–99)
Glucose-Capillary: 32 mg/dL — CL (ref 70–99)
Glucose-Capillary: 126 mg/dL — ABNORMAL HIGH (ref 70–99)
Glucose-Capillary: 68 mg/dL — ABNORMAL LOW (ref 70–99)
Glucose-Capillary: 82 mg/dL (ref 70–99)
Glucose-Capillary: 69 mg/dL — ABNORMAL LOW (ref 70–99)
Glucose-Capillary: 151 mg/dL — ABNORMAL HIGH (ref 70–99)

## 2024-05-01 SURGERY — CYSTOSCOPY/URETEROSCOPY/HOLMIUM LASER/STENT PLACEMENT
Anesthesia: General | Laterality: Right

## 2024-05-01 MED ORDER — SODIUM CHLORIDE 0.9 % IR SOLN
Status: DC | PRN
  Administered 2024-05-01: 3000 mL via INTRAVESICAL

## 2024-05-01 MED ORDER — DEXAMETHASONE SODIUM PHOSPHATE 4 MG/ML IJ SOLN
INTRAMUSCULAR | Status: DC | PRN
  Administered 2024-05-01: 6 mg via INTRAVENOUS

## 2024-05-01 MED ORDER — PHENYLEPHRINE 80 MCG/ML (10ML) SYRINGE FOR IV PUSH (FOR BLOOD PRESSURE SUPPORT)
PREFILLED_SYRINGE | INTRAVENOUS | Status: AC
  Filled 2024-05-01: qty 10

## 2024-05-01 MED ORDER — LACTATED RINGERS IV SOLN
INTRAVENOUS | Status: DC
Start: 2024-05-01 — End: 2024-05-01

## 2024-05-01 MED ORDER — LIDOCAINE HCL (CARDIAC) PF 100 MG/5ML IV SOSY
PREFILLED_SYRINGE | INTRAVENOUS | Status: DC | PRN
  Administered 2024-05-01: 80 mg via INTRAVENOUS

## 2024-05-01 MED ORDER — FENTANYL CITRATE (PF) 100 MCG/2ML IJ SOLN
INTRAMUSCULAR | Status: AC
  Filled 2024-05-01: qty 2

## 2024-05-01 MED ORDER — ONDANSETRON HCL 4 MG/2ML IJ SOLN
INTRAMUSCULAR | Status: AC
  Filled 2024-05-01: qty 2

## 2024-05-01 MED ORDER — DEXTROSE 50 % IV SOLN
25.0000 g | Freq: Once | INTRAVENOUS | Status: AC
  Administered 2024-05-01: 25 g via INTRAVENOUS

## 2024-05-01 MED ORDER — EPHEDRINE 5 MG/ML INJ
INTRAVENOUS | Status: AC
  Filled 2024-05-01: qty 10

## 2024-05-01 MED ORDER — ONDANSETRON HCL 4 MG/2ML IJ SOLN: 4.0000 mg | Freq: Once | INTRAMUSCULAR | Status: DC | PRN

## 2024-05-01 MED ORDER — CHLORHEXIDINE GLUCONATE 0.12 % MT SOLN: 15.0000 mL | Freq: Once | OROMUCOSAL | Status: DC

## 2024-05-01 MED ORDER — DEXTROSE 50 % IV SOLN: 25.0000 g | INTRAVENOUS | Status: AC

## 2024-05-01 MED ORDER — PROPOFOL 10 MG/ML IV BOLUS
INTRAVENOUS | Status: DC | PRN
  Administered 2024-05-01: 80 mg via INTRAVENOUS

## 2024-05-01 MED ORDER — LIDOCAINE HCL (PF) 2 % IJ SOLN
INTRAMUSCULAR | Status: AC
Start: 2024-05-01 — End: 2024-05-01
  Filled 2024-05-01: qty 5

## 2024-05-01 MED ORDER — ONDANSETRON HCL 4 MG/2ML IJ SOLN
INTRAMUSCULAR | Status: DC | PRN
  Administered 2024-05-01: 4 mg via INTRAVENOUS

## 2024-05-01 MED ORDER — OXYCODONE HCL 5 MG PO TABS: 5.0000 mg | ORAL_TABLET | Freq: Once | ORAL | Status: DC | PRN

## 2024-05-01 MED ORDER — CEFAZOLIN SODIUM-DEXTROSE 2-4 GM/100ML-% IV SOLN
INTRAVENOUS | Status: AC
  Filled 2024-05-01: qty 100

## 2024-05-01 MED ORDER — EPHEDRINE SULFATE (PRESSORS) 50 MG/ML IJ SOLN
INTRAMUSCULAR | Status: DC | PRN
  Administered 2024-05-01: 7.5 mg via INTRAVENOUS
  Administered 2024-05-01 (×2): 5 mg via INTRAVENOUS

## 2024-05-01 MED ORDER — CEFAZOLIN SODIUM-DEXTROSE 2-4 GM/100ML-% IV SOLN
2.0000 g | INTRAVENOUS | Status: AC
  Administered 2024-05-01: 2 g via INTRAVENOUS

## 2024-05-01 MED ORDER — FENTANYL CITRATE (PF) 100 MCG/2ML IJ SOLN
INTRAMUSCULAR | Status: DC | PRN
  Administered 2024-05-01: 50 ug via INTRAVENOUS

## 2024-05-01 MED ORDER — IOHEXOL 300 MG/ML  SOLN
INTRAMUSCULAR | Status: DC | PRN
  Administered 2024-05-01: 10 mL

## 2024-05-01 MED ORDER — DEXTROSE 50 % IV SOLN
INTRAVENOUS | Status: AC
  Administered 2024-05-01: 25 g via INTRAVENOUS
  Filled 2024-05-01: qty 50

## 2024-05-01 MED ORDER — OXYCODONE HCL 5 MG/5ML PO SOLN: 5.0000 mg | Freq: Once | ORAL | Status: DC | PRN

## 2024-05-01 MED ORDER — FENTANYL CITRATE PF 50 MCG/ML IJ SOSY: 25.0000 ug | PREFILLED_SYRINGE | INTRAMUSCULAR | Status: DC | PRN

## 2024-05-01 MED ORDER — DEXTROSE 50 % IV SOLN
INTRAVENOUS | Status: AC
  Filled 2024-05-01: qty 50

## 2024-05-01 MED ORDER — ORAL CARE MOUTH RINSE: 15.0000 mL | Freq: Once | OROMUCOSAL | Status: DC

## 2024-05-01 MED ORDER — STERILE WATER FOR IRRIGATION IR SOLN
Status: DC | PRN
  Administered 2024-05-01: 500 mL

## 2024-05-01 SURGICAL SUPPLY — 32 items
BAG URINE DRAIN 2000ML AR STRL (UROLOGICAL SUPPLIES) IMPLANT
BAG URO CATCHER STRL LF (MISCELLANEOUS) ×3 IMPLANT
BASKET ZERO TIP NITINOL 2.4FR (BASKET) IMPLANT
BENZOIN TINCTURE PRP APPL 2/3 (GAUZE/BANDAGES/DRESSINGS) IMPLANT
BRUSH URET BIOPSY 3F (UROLOGICAL SUPPLIES) IMPLANT
CATH URETERAL DUAL LUMEN 10F (MISCELLANEOUS) IMPLANT
CATH URETL OPEN 5X70 (CATHETERS) ×3 IMPLANT
CLOTH BEACON ORANGE TIMEOUT ST (SAFETY) ×3 IMPLANT
DRAPE FOOT SWITCH (DRAPES) ×3 IMPLANT
DRSG TEGADERM 2-3/8X2-3/4 SM (GAUZE/BANDAGES/DRESSINGS) IMPLANT
ELECT REM PT RETURN 15FT ADLT (MISCELLANEOUS) ×3 IMPLANT
EVACUATOR MICROVAS BLADDER (UROLOGICAL SUPPLIES) IMPLANT
FIBER LASER MOSES 200 DFL (Laser) IMPLANT
GLOVE BIOGEL M 7.0 STRL (GLOVE) ×3 IMPLANT
GOWN STRL REUS W/ TWL XL LVL3 (GOWN DISPOSABLE) ×3 IMPLANT
GUIDEWIRE STR DUAL SENSOR (WIRE) ×6 IMPLANT
GUIDEWIRE ZIPWRE .038 STRAIGHT (WIRE) IMPLANT
HOLDER FOLEY CATH W/STRAP (MISCELLANEOUS) IMPLANT
KIT TURNOVER KIT A (KITS) ×3 IMPLANT
LOOP CUT BIPOLAR 24F LRG (ELECTROSURGICAL) IMPLANT
MANIFOLD NEPTUNE II (INSTRUMENTS) ×3 IMPLANT
NS IRRIG 1000ML POUR BTL (IV SOLUTION) IMPLANT
PACK CYSTO (CUSTOM PROCEDURE TRAY) ×3 IMPLANT
PAD PREP 24X48 CUFFED NSTRL (MISCELLANEOUS) ×3 IMPLANT
SHEATH DILATOR SET 8/10 (MISCELLANEOUS) IMPLANT
SHEATH NAVIGATOR HD 11/13X28 (SHEATH) IMPLANT
SHEATH NAVIGATOR HD 11/13X36 (SHEATH) IMPLANT
STENT URET 6FRX24 CONTOUR (STENTS) IMPLANT
SYRINGE TOOMEY IRRIG 70ML (MISCELLANEOUS) IMPLANT
TRACTIP FLEXIVA PULS ID 200XHI (Laser) IMPLANT
TUBING CONNECTING 10 (TUBING) ×3 IMPLANT
TUBING UROLOGY SET (TUBING) ×3 IMPLANT

## 2024-05-01 NOTE — Anesthesia Procedure Notes (Signed)
 Procedure Name: LMA Insertion Date/Time: 05/01/2024 3:30 PM  Performed by: Vincenzo Show, CRNAPre-anesthesia Checklist: Patient identified, Emergency Drugs available, Suction available, Patient being monitored and Timeout performed Patient Re-evaluated:Patient Re-evaluated prior to induction Oxygen Delivery Method: Circle system utilized Preoxygenation: Pre-oxygenation with 100% oxygen Induction Type: IV induction Ventilation: Mask ventilation without difficulty LMA: LMA inserted and LMA with gastric port inserted LMA Size: 4.0 Tube size: 4.0 mm Number of attempts: 1 Placement Confirmation: positive ETCO2 and breath sounds checked- equal and bilateral Tube secured with: Tape Dental Injury: Teeth and Oropharynx as per pre-operative assessment

## 2024-05-01 NOTE — Transfer of Care (Signed)
 Immediate Anesthesia Transfer of Care Note  Patient: Brooke [name]  Procedure(s) Performed: Procedure(s): CYSTOSCOPY/URETEROSCOPY/EXCHANGE (Right) CYSTOSCOPY, WITH RETROGRADE PYELOGRAM (Right) CYSTOSCOPY, WITH URETERAL BIOPSY (N/A)  Patient Location: PACU  Anesthesia Type:General  Level of Consciousness:  sedated, patient cooperative and responds to stimulation  Airway & Oxygen Therapy:Patient Spontanous Breathing and Patient connected to face mask oxgen  Post-op Assessment:  Report given to PACU RN and Post -op Vital signs reviewed and stable  Post vital signs:  Reviewed and stable  Last Vitals:  Vitals:   05/01/24 1339 05/01/24 1556  BP:  130/74  Pulse: 76 83  Resp:  14  Temp:    SpO2: 100% 100%    Complications: No apparent anesthesia complications

## 2024-05-01 NOTE — Op Note (Addendum)
 Operative Note  Preoperative diagnosis:  1.  Areflexic bladder 2.  Right hydronephrosis  Postoperative diagnosis: Same  Procedure(s): 1.  Cystoscopy 2.  Right ureteroscopy with distal right ureteral biopsy 3.  Right retrograde pyelogram 4.  Right ureteral stent exchange 5. Fluoroscopy with intraoperative interpretation  Surgeon: Donnice Siad, MD  Assistants:  None  Anesthesia:  General  Complications:  None  EBL:  Minimal  Specimens: 1.  ID Type Source Tests Collected by Time Destination  1 : Right Ureteral Brush Biopsy Tissue PATH GU biopsy SURGICAL PATHOLOGY Siad Donnice SAUNDERS, MD 05/01/2024 1544    Drains/Catheters: 1.  Right 6Fr x 24cm ureteral stent with a tether string tied to foley catheter 2.  16 French Foley catheter  Intraoperative findings:   Cystoscopy demonstrated trabeculated bladder with redundant bladder tissue with large capacity bladder consistent with chronic areflexic bladder.  No suspicious bladder lesions. Right retrograde pyelogram with severe right hydroureteronephrosis from the level of the right distal ureter to the kidney.  No filling defects.  No extravasation of contrast.  As the right ureter was widely patent, the right hydronephrosis that is chronic is more likely a reflux process given chronically distended bladder with an areflexive bladder. Right ureteroscopy demonstrated no suspicious masses, stones or other lesions.  I did obtain brush biopsy on chronically inflamed appearing distal right ureteral mucosa. Successful right ureteral stent exchange.  Indication:  Brooke [name] is a 63 y.o. female with a history of an areflexic bladder recently confirmed on urodynamics as well as right hydronephrosis initially presenting with complicated right pyelonephritis requiring right stent placement presents today for evaluation of her right upper tract.  She completed preoperative antibiotics and is asymptomatic for infection today.  Description of  procedure: After informed consent was obtained from the patient, the patient was identified and taken to the operating room and placed in the supine position.  General anesthesia was administered as well as perioperative IV antibiotics.  At the beginning of the case, a time-out was performed to properly identify the patient, the surgery to be performed, and the surgical site.  Sequential compression devices were applied to the lower extremities at the beginning of the case for DVT prophylaxis.  The patient was then placed in the dorsal lithotomy supine position, prepped and draped in sterile fashion.  Preliminary scout fluoroscopy revealed that there was indwelling right ureteral stent present in appropriate position.We then passed the 21-French rigid cystoscope through the urethra and into the bladder under vision without any difficulty.  A systematic evaluation of the bladder revealed no evidence of any suspicious bladder lesions.  She did have chronically distended bladder consistent with a reflexive bladder.  Ureteral orifices were in normal position.  Of note, the right ureteral stent was seen emanating from the right ureteral orifice and this ureteral orifice was widely patent.  The distal aspect of the ureteral stent was seen protruding from the right ureteral orifice.  We then used the alligator-tooth forceps and grasped the distal end of the ureteral stent and brought it out the urethral meatus while watching the proximal coil straighten out nicely on fluoroscopy. Through the ureteral stent, we then passed a 0.038 sensor wire up to the level of the renal pelvis.  The ureteral stent was then removed, leaving the sensor wire up the right ureter.    Under cystoscopic and flouroscopic guidance, we cannulated the distal ureteral orifice with a 5-French open-ended ureteral catheter and a gentle retrograde pyelogram was performed, revealing severe right hydroureteronephrosis to  the level of the bladder.   There was no filling defects and no extravasation of contrast.   A semirigid ureteroscope was then passed alongside the distal right ureter.  I encountered no suspicious masses.  She did have chronically inflamed mucosa in this location.  I advanced this scope all the way to the right renal pelvis noting no evidence of suspicious lesions.  There was chronically dilated right upper tract.  I inserted a brush biopsy tool and perform brush biopsy of her distal right ureter.  This was passed off as specimen.  Following this, the ureter was widely patent and the contrast had already drained.  Therefore, I elected not to leave a  long-term stent.  Once the ureteroscope was removed, we then used the Glidewire under fluoroscopic guidance and passed up a 6-French x 24 cm double-pigtail ureteral stent up the ureter, making sure that the proximal and distal ends coiled within the kidney and bladder respectively.  Note that we left a long tether string attached to the distal end of the ureteral stent.  The string was then tied to a 16 [place] Foley catheter and Foley catheter was then advanced into the bladder.  Plan:  Patient will be discharged home.  Follow up with me in 7 to 10 days for stent removal in the office.  We will also remove her Foley catheter and teach her to undergo CIC given that she has newly diagnosed areflexic bladder.   Brooke R. Symphani Eckstrom MD [facility]  Pager: [phone]

## 2024-05-01 NOTE — Progress Notes (Signed)
 Hypoglycemic Event  CBG: 28 then 32  Treatment: D50 50 mL (25 gm)  Symptoms: Pale and Shaky  Follow-up CBG: Time: CBG Result:  Possible Reasons for Event: Inadequate meal intake and Other: NPO for surgery  Comments/MD notified:Dr Rockey Cornea [id number]

## 2024-05-01 NOTE — H&P (Signed)
 Office Visit Report     04/19/2024   --------------------------------------------------------------------------------   Brooke [name]  MRN: [id number]  DOB: September 20, 1960, 63 year old Female  SSN:    PRIMARY CARE:     REFERRING:  Ubaldo FABIENE Eagles, NP  PROVIDER:  Donnice Siad, M.D.  LOCATION:  [facility] [phone]     --------------------------------------------------------------------------------   CC/HPI: Brooke [name] is a 63 year old female who is seen today in follow-up with a history of areflexic bladder and right hydronephrosis.   1. Areflexic bladder:  She was found to have distended bladder with purulent debris intraoperatively in 02/2024. Foley catheter was placed.  - Urodynamics 03/23/2024: max capacity of approx. 351 mls. Her 1st sensation was felt at 304 mls. There was positive instability. One unstable detrusor contraction noted. She felt an increased urgency and leaked. Some artifact interfered with this part of study. She was able to generate a voluntary contraction and void 134 ml with max flow of 5 ml/s. max detrusor pressure was 2 cmH20. EMG leads were basically quiet during the voiding phase. PVR was approx. 216 ml. Trabeculation was noted. Right reflux was seen. A 14 French Foley Catheter was reinserted before the patient left the clinic.  -She is tolerating her Foley catheter well. Is draining clear yellow urine.   #2. Right hydronephrosis:  - She presented to the ED in 7/25 with lower abdominal pain, anorexia, nausea, fatigue and malaise. Previously, she was treated for complicated UTI for 2 weeks prior to arrival. She was found to have diffuse abdominal pain and AKI of creatinine 1.6 from baseline 0.6.  - CT A/P 02/2024 revealed moderate right hydroureteronephrosis with marked right hydroureter down to level of the right UVJ new compared to prior CT in 2022. She also had diffuse bladder wall thickening.  - She underwent right ureteral stent placement on  03/02/2024 with right retrograde pyelogram revealing severe right hydroureteronephrosis with tortuous ureter with complete 360 degree loop in the proximal right ureter.  - She is scheduled for right ureteroscopy, possible biopsy, possible stent change versus removal on 05/01/2024.   #3. UTI: Urine cultures 02/2024 grew alphahemolytic strep and MRSE but urine culture assessed at time of ureteroscopy was positive for Klebsiella. She was transition from IV antibiotics to Bactrim  for a total of 10 days to cover all 3 organisms.   Past medical history significant for poorly controlled diabetes with A1c of 14.7 on 7/25, hypertension, depression, hyperlipidemia, CHF, and anxiety.     ALLERGIES: Aspirin  Darvocet Erthromycin Gabapentin Ibuprofen metFORMIN  Propoxyphene    MEDICATIONS: Aspirin  81 MG Tablet Delayed Release  Atorvastatin  Calcium  80 MG Tablet  Clopidogrel  Bisulfate 75 MG Tablet  Insulin  Aspart 100 UNIT/ML Solution  Metoprolol  Succinate ER 25 MG Tablet Extended Release 24 Hour  Multivitamin  oxyCODONE -Acetaminophen  5-325 MG Tablet  Premarin  0.625 MG/GM Cream  Sulfamethoxazole -Trimethoprim  800-160 MG Tablet  Tylenol   Valium  5 MG Tablet  Vitamin B12     GU PSH: Complex cystometrogram, w/ void pressure and urethral pressure profile studies, any technique - 03/23/2024 Complex Uroflow - 03/23/2024 Cystoscopy Insert Stent, Right - 03/02/2024 Emg surf Electrd - 03/23/2024 Inject For cystogram - 03/23/2024 Intrabd voidng Press - 03/23/2024       PSH Notes: 07/2023: Peripheral vascular balloon angioplasty-left  03/2023: Esophageal manometry  10/2021: Right/left heart cath and coronary angiography  1996 bilateral breast surgery  1996 appendectomy  Date unknown inguinal hernia repair and ovarian cyst removal   NON-GU PSH: Visit Complexity (formerly GPC1X) - 03/14/2024  GU PMH: Urinary Retention - 03/23/2024, - 03/14/2024 Acute Cystitis/UTI - 03/14/2024 Bladder-neck stenosis/contracture -  03/14/2024 Hydronephrosis - 03/14/2024    NON-GU PMH: Anxiety Congestive heart failure Coronary Artery Disease Depression Diabetes Type 2 Hypertension Major depressive disorder, recurrent, moderate Other dysphagia Peripheral vascular disease    FAMILY HISTORY: Arthritis - Mother Diabetes - Mother Hypertension - Mother pancreatic cancer - Father   SOCIAL HISTORY: Marital Status: Married Current Smoking Status: Patient has never smoked.   Tobacco Use Assessment Completed: Used Tobacco in last 30 days? Has never drank.     REVIEW OF SYSTEMS:    GU Review Female:   Patient denies frequent urination, hard to postpone urination, burning /pain with urination, get up at night to urinate, leakage of urine, stream starts and stops, trouble starting your stream, have to strain to urinate, and being pregnant.  Gastrointestinal (Upper):   Patient denies nausea, vomiting, and indigestion/ heartburn.  Gastrointestinal (Lower):   Patient denies constipation and diarrhea.  Constitutional:   Patient denies fever, night sweats, weight loss, and fatigue.  Skin:   Patient denies skin rash/ lesion and itching.  Eyes:   Patient denies blurred vision and double vision.  Ears/ Nose/ Throat:   Patient denies sore throat and sinus problems.  Hematologic/Lymphatic:   Patient denies swollen glands and easy bruising.  Cardiovascular:   Patient denies leg swelling and chest pains.  Respiratory:   Patient denies cough and shortness of breath.  Endocrine:   Patient denies excessive thirst.  Musculoskeletal:   Patient denies back pain and joint pain.  Neurological:   Patient denies headaches and dizziness.  Psychologic:   Patient denies depression and anxiety.   VITAL SIGNS: None   MULTI-SYSTEM PHYSICAL EXAMINATION:    Constitutional: Well-nourished. No physical deformities. Normally developed. Good grooming.  Respiratory: No labored breathing, no use of accessory muscles.   Cardiovascular: Normal  temperature, normal extremity pulses, no swelling, no varicosities.  Gastrointestinal: No mass, no tenderness, no rigidity, non obese abdomen.     Complexity of Data:  Source Of History:  Patient, Medical Record Summary  Records Review:   Previous Doctor Records, Previous Hospital Records, Previous Patient Records  Urine Test Review:   Urinalysis  X-Ray Review: C.T. Abdomen/Pelvis: Reviewed Films. Reviewed Report. Discussed With Patient.     PROCEDURES:          Visit Complexity - G2211    ASSESSMENT:      ICD-10 Details  1 GU:   Urinary Retention - R33.8   2   Hydronephrosis - N13.0   3   Areflexic bladder - N31.2    PLAN:            Medications New Meds: Cephalexin  500 MG Capsule 1 capsule PO BID Take this prescription starting on 04/24/2024  #14  0 Refill(s)  Pharmacy Name:  [facility]  Address:  [address], [address]   [place], [place] [address]  Phone:  [phone]  Fax:  [phone]            Document Letter(s):  Created for Patient: Clinical Summary         Notes:    1. Areflextive bladder:  Reviewed UDS findings with areflexic bladder. Discussed options including chronic indwelling Foley catheter, CIC or suprapubic tube. Recommend proceeding with CIC. Discussed that unfortunately, there is no surgery medication that I can deliver that will allow her to develop detrusor function. Explained that our goal is  to reduce pressure to improve renal function and prevent recurrent urinary tract infections with maximal drainage of her bladder.  - She agrees to CIC. She would like to wait until we perform surgery scheduled for next week.   #2. Right hydroureteronephrosis:  - She underwent right ureteral stent placement on 03/02/2024 with right retrograde pyelogram revealing severe right hydroureteronephrosis with tortuous ureter with complete 360 degree loop in the proximal right ureter.  - She is scheduled for right  ureteroscopy, possible biopsy, possible stent change versus removal on 05/01/2024.   #3. History of UTI: Asymptomatic today. Provided antibiotics to take 1 week leading up to day of surgery.   Urology Preoperative H&P   Chief Complaint: Right hydronephrosis  History of Present Illness: Brooke [name] is a 63 y.o. female with right hydronephrosis here for right retrograde pyelogram, right ureteroscopy, possible biopsy and stent exchange versus removal. Denies fevers, chills, dysuria. Urine culture 8/5 with no UTI. She took prophylactic antibiotics over the past week.    Past Medical History:  Diagnosis Date   Abdominal pain, left lower quadrant 12/17/2015   Anemia    Anxiety 07/01/2016   Carotid artery disease (HCC) 10/27/2022   Chest pain, unspecified 10/05/2003   CHF (congestive heart failure) (HCC)    Chronic systolic heart failure (HCC) 02/20/2022   Depression, major, recurrent, moderate (HCC) 07/31/2005   Diabetes mellitus without complication (HCC) 09/08/2006   Endometriosis 03/28/1996   GERD (gastroesophageal reflux disease)    Heart murmur    History of colonic polyps 08/07/2015   20 mm removed 05/10/15 by Dr. Kristie     PAD (peripheral artery disease) [facility])    Unspecified constipation 06/24/2004    Past Surgical History:  Procedure Laterality Date   ABDOMINAL AORTOGRAM W/LOWER EXTREMITY Left 07/29/2023   Procedure: ABDOMINAL AORTOGRAM W/LOWER EXTREMITY;  Surgeon: Gretta Lonni PARAS, MD;  Location: [facility] INVASIVE CV LAB;  Service: Cardiovascular;  Laterality: Left;   APPENDECTOMY  08/10/1994   Dr. Buford   BREAST SURGERY Bilateral 08/10/1994   Dr. Ardeen   calcifications removed   CYSTOSCOPY W/ URETERAL STENT PLACEMENT Right 03/02/2024   Procedure: CYSTOSCOPY, WITH RETROGRADE PYELOGRAM AND URETERAL STENT INSERTION;  Surgeon: Selma Donnice SAUNDERS, MD;  Location: [facility] OR;  Service: Urology;  Laterality: Right;   ESOPHAGEAL MANOMETRY N/A 03/23/2023   Procedure: ESOPHAGEAL MANOMETRY  (EM);  Surgeon: Leigh Elspeth SQUIBB, MD;  Location: [facility] ENDOSCOPY;  Service: Gastroenterology;  Laterality: N/A;   INGUINAL HERNIA REPAIR     OVARIAN CYST REMOVAL     PERIPHERAL VASCULAR BALLOON ANGIOPLASTY Left 07/29/2023   Procedure: PERIPHERAL VASCULAR BALLOON ANGIOPLASTY;  Surgeon: Gretta Lonni PARAS, MD;  Location: [facility] INVASIVE CV LAB;  Service: Cardiovascular;  Laterality: Left;   RIGHT/LEFT HEART CATH AND CORONARY ANGIOGRAPHY N/A 10/09/2021   Procedure: RIGHT/LEFT HEART CATH AND CORONARY ANGIOGRAPHY;  Surgeon: Wendel Lurena POUR, MD;  Location: [facility] INVASIVE CV LAB;  Service: Cardiovascular;  Laterality: N/A;   TOOTH EXTRACTION N/A 07/28/2022   Procedure: EXTRACTION ALL REMAING TEETH THREE, FOUR, FIVE, SIX, SEVEN, EIGHT, NINE, TEN, ELEVEN, TWELVE, TWENTY ONE, TWENTY TWO, TWENTY THREE, TWENTY FOUR, TWENTY FIVE, TWENTY SIX, TWENTY SEVEN, TWENTY EIGHT, TWENTY NINE, THIRTY, AVEOLOPLASTY;  Surgeon: Sheryle Hamilton, DMD;  Location: [facility] OR;  Service: Oral Surgery;  Laterality: N/A;    Allergies:  Allergies  Allergen Reactions   Glucophage  [Metformin ] Diarrhea, Nausea Only and Other (See Comments)    General GI intolerance    Bayer Aspirin  [Aspirin ] Other (See Comments)    Stomach  pain occurs with 325mg  dose, able to tolerate 81mg .   Darvocet [Propoxyphene N-Acetaminophen ] Rash   Darvon-N [Propoxyphene] Rash   Ery-Tab [Erythromycin] Nausea And Vomiting   Motrin [Ibuprofen] Other (See Comments)    Stomach pain   Neurontin [Gabapentin] Other (See Comments)    Suicidal ideations     Family History  Problem Relation Age of Onset   Diabetes Mother    Hypertension Mother    Arthritis Mother        RA   Pancreatic cancer Father    Stomach cancer Neg Hx    Colon cancer Neg Hx    Esophageal cancer Neg Hx    Rectal cancer Neg Hx     Social History:  reports that she has never smoked. She has never used smokeless tobacco. She reports that she does not drink alcohol and does not use  drugs.  ROS: A complete review of systems was performed.  All systems are negative except for pertinent findings as noted.  Physical Exam:  Vital signs in last 24 hours:   Constitutional:  Alert and oriented, No acute distress Cardiovascular: Regular rate and rhythm Respiratory: Normal respiratory effort, Lungs clear bilaterally GI: Abdomen is soft, nontender, nondistended, no abdominal masses GU: No CVA tenderness Lymphatic: No lymphadenopathy Neurologic: Grossly intact, no focal deficits Psychiatric: Normal mood and affect  Laboratory Data:  No results for input(s): WBC, HGB, HCT, PLT in the last 72 hours.  No results for input(s): NA, K, CL, GLUCOSE, BUN, CALCIUM , CREATININE in the last 72 hours.  Invalid input(s): CO3   No results found for this or any previous visit (from the past 24 hours). No results found for this or any previous visit (from the past 240 hours).  Renal Function: No results for input(s): CREATININE in the last 168 hours. Estimated Creatinine Clearance: 55.8 mL/min (by C-G formula based on SCr of 0.81 mg/dL).  Radiologic Imaging: No results found.  I independently reviewed the above imaging studies.  Assessment and Plan Brooke [name] is a 63 y.o. female with right hydronephrosis here for right retrograde pyelogram, right ureteroscopy, possible biopsy and stent exchange versus removal.   -The risks, benefits and alternatives of cystoscopy with right JJ stent placement vs removal was discussed with the patient.  Risks include, but are not limited to: bleeding, urinary tract infection, ureteral injury, ureteral stricture disease, chronic pain, urinary symptoms, bladder injury, stent migration, the need for nephrostomy tube placement, MI, CVA, DVT, PE and the inherent risks with general anesthesia.  The patient voices understanding and wishes to proceed.   Matt R. Brooke Morss MD 05/01/2024, 8:04 AM  [facility] Pager:  [phone]

## 2024-05-01 NOTE — Discharge Instructions (Addendum)
 [facility] [phone] Post Ureteroscopy With or Without Stent Instructions  Definitions:  Ureter: The duct that transports urine from the kidney to the bladder. Stent:   A plastic hollow tube that is placed into the ureter, from the kidney to the bladder to prevent the ureter from swelling shut.  GENERAL INSTRUCTIONS:  Despite the fact that no skin incisions were used, the area around the ureter and bladder is raw and irritated. The stent is a foreign body which will further irritate the bladder wall. This irritation is manifested by increased frequency of urination, both day and night, and by an increase in the urge to urinate. In some, the urge to urinate is present almost always. Sometimes the urge is strong enough that you may not be able to stop yourself from urinating. The only real cure is to remove the stent and then give time for the bladder wall to heal which can't be done until the danger of the ureter swelling shut has passed, which varies.  You may see some blood in your urine while the stent is in place and a few days afterwards. Do not be alarmed, even if the urine was clear for a while. Get off your feet and drink lots of fluids until clearing occurs. If you start to pass clots or don't improve, call us .  DIET: You may return to your normal diet immediately. Because of the raw surface of your bladder, alcohol, spicy foods, acid type foods and drinks with caffeine may cause irritation or frequency and should be used in moderation. To keep your urine flowing freely and to avoid constipation, drink plenty of fluids during the day ( 8-10 glasses ). Tip: Avoid cranberry juice because it is very acidic.  ACTIVITY: Your physical activity doesn't need to be restricted. However, if you are very active, you may see some blood in your urine. We suggest that you reduce your activity under these circumstances until the bleeding has stopped.  BOWELS: It is important to  keep your bowels regular during the postoperative period. Straining with bowel movements can cause bleeding. A bowel movement every other day is reasonable. Use a mild laxative if needed, such as Milk of Magnesia 2-3 tablespoons, or 2 Dulcolax tablets. Call if you continue to have problems. If you have been taking narcotics for pain, before, during or after your surgery, you may be constipated. Take a laxative if necessary.   MEDICATION: You should resume your pre-surgery medications unless told not to. In addition you will often be given an antibiotic to prevent infection. These should be taken as prescribed until the bottles are finished unless you are having an unusual reaction to one of the drugs.  PROBLEMS YOU SHOULD REPORT TO US : Fevers over 100.5 Fahrenheit. Heavy bleeding, or clots ( See above notes about blood in urine ). Inability to urinate. Drug reactions ( hives, rash, nausea, vomiting, diarrhea ). Severe burning or pain with urination that is not improving.  FOLLOW-UP: You will need a follow-up appointment to monitor your progress. Call for this appointment at the number listed above. Usually the first appointment will be about three to fourteen days after your surgery.   You have a Foley catheter draining your bladder which is tied to a ureteral stent.  The stent and Foley catheter will be removed and follow-up.  Then, you will learn how to intermittently catheterize yourself.

## 2024-05-01 NOTE — Anesthesia Postprocedure Evaluation (Signed)
 Anesthesia Post Note  Patient: Brooke [name]  Procedure(s) Performed: CYSTOSCOPY/URETEROSCOPY/EXCHANGE (Right) CYSTOSCOPY, WITH RETROGRADE PYELOGRAM (Right) CYSTOSCOPY, WITH URETERAL BIOPSY     Patient location during evaluation: PACU Anesthesia Type: General Level of consciousness: awake and alert Pain management: pain level controlled Vital Signs Assessment: post-procedure vital signs reviewed and stable Respiratory status: spontaneous breathing, nonlabored ventilation, respiratory function stable and patient connected to nasal cannula oxygen Cardiovascular status: blood pressure returned to baseline and stable Postop Assessment: no apparent nausea or vomiting Anesthetic complications: no   No notable events documented.  Last Vitals:  Vitals:   05/01/24 1626 05/01/24 1632  BP: 132/77 115/74  Pulse: 82 85  Resp: 15 14  Temp:  36.4 C  SpO2: 100% 100%    Last Pain:  Vitals:   05/01/24 1632  TempSrc:   PainSc: 0-No pain                 Lynwood MARLA Cornea

## 2024-05-02 ENCOUNTER — Encounter (HOSPITAL_COMMUNITY): Payer: Self-pay | Admitting: Urology

## 2024-05-03 LAB — SURGICAL PATHOLOGY

## 2024-05-09 ENCOUNTER — Other Ambulatory Visit: Payer: Self-pay

## 2024-05-09 MED ORDER — FLUCONAZOLE 200 MG PO TABS
200.0000 mg | ORAL_TABLET | Freq: Every day | ORAL | 0 refills | Status: DC
  Filled 2024-05-09: qty 14, 14d supply, fill #0

## 2024-05-11 ENCOUNTER — Inpatient Hospital Stay (HOSPITAL_COMMUNITY)
Admission: EM | Admit: 2024-05-11 | Discharge: 2024-05-17 | DRG: 637 | Disposition: A | Discharge status: Home / Self Care | Attending: Internal Medicine | Admitting: Internal Medicine

## 2024-05-11 ENCOUNTER — Emergency Department (HOSPITAL_COMMUNITY)

## 2024-05-11 ENCOUNTER — Encounter (HOSPITAL_COMMUNITY): Payer: Self-pay

## 2024-05-11 ENCOUNTER — Other Ambulatory Visit: Payer: Self-pay

## 2024-05-11 ENCOUNTER — Telehealth: Payer: Self-pay | Admitting: Cardiology

## 2024-05-11 ENCOUNTER — Ambulatory Visit: Payer: Self-pay

## 2024-05-11 DIAGNOSIS — R1032 Left lower quadrant pain: Secondary | ICD-10-CM

## 2024-05-11 DIAGNOSIS — D638 Anemia in other chronic diseases classified elsewhere: Secondary | ICD-10-CM | POA: Diagnosis present

## 2024-05-11 DIAGNOSIS — E1165 Type 2 diabetes mellitus with hyperglycemia: Secondary | ICD-10-CM

## 2024-05-11 DIAGNOSIS — R54 Age-related physical debility: Secondary | ICD-10-CM | POA: Diagnosis present

## 2024-05-11 DIAGNOSIS — Z8261 Family history of arthritis: Secondary | ICD-10-CM

## 2024-05-11 DIAGNOSIS — Z886 Allergy status to analgesic agent status: Secondary | ICD-10-CM

## 2024-05-11 DIAGNOSIS — N136 Pyonephrosis: Secondary | ICD-10-CM | POA: Diagnosis present

## 2024-05-11 DIAGNOSIS — Z8249 Family history of ischemic heart disease and other diseases of the circulatory system: Secondary | ICD-10-CM

## 2024-05-11 DIAGNOSIS — Z7982 Long term (current) use of aspirin: Secondary | ICD-10-CM

## 2024-05-11 DIAGNOSIS — R1314 Dysphagia, pharyngoesophageal phase: Secondary | ICD-10-CM | POA: Diagnosis present

## 2024-05-11 DIAGNOSIS — N312 Flaccid neuropathic bladder, not elsewhere classified: Secondary | ICD-10-CM | POA: Diagnosis present

## 2024-05-11 DIAGNOSIS — E1151 Type 2 diabetes mellitus with diabetic peripheral angiopathy without gangrene: Secondary | ICD-10-CM | POA: Diagnosis present

## 2024-05-11 DIAGNOSIS — I110 Hypertensive heart disease with heart failure: Secondary | ICD-10-CM | POA: Diagnosis present

## 2024-05-11 DIAGNOSIS — R9431 Abnormal electrocardiogram [ECG] [EKG]: Secondary | ICD-10-CM | POA: Diagnosis not present

## 2024-05-11 DIAGNOSIS — T839XXA Unspecified complication of genitourinary prosthetic device, implant and graft, initial encounter: Secondary | ICD-10-CM

## 2024-05-11 DIAGNOSIS — Z794 Long term (current) use of insulin: Secondary | ICD-10-CM

## 2024-05-11 DIAGNOSIS — E1010 Type 1 diabetes mellitus with ketoacidosis without coma: Secondary | ICD-10-CM

## 2024-05-11 DIAGNOSIS — Z885 Allergy status to narcotic agent status: Secondary | ICD-10-CM

## 2024-05-11 DIAGNOSIS — T83518A Infection and inflammatory reaction due to other urinary catheter, initial encounter: Secondary | ICD-10-CM | POA: Diagnosis present

## 2024-05-11 DIAGNOSIS — I2510 Atherosclerotic heart disease of native coronary artery without angina pectoris: Secondary | ICD-10-CM | POA: Diagnosis present

## 2024-05-11 DIAGNOSIS — E785 Hyperlipidemia, unspecified: Secondary | ICD-10-CM | POA: Diagnosis present

## 2024-05-11 DIAGNOSIS — Z1152 Encounter for screening for COVID-19: Secondary | ICD-10-CM

## 2024-05-11 DIAGNOSIS — Z833 Family history of diabetes mellitus: Secondary | ICD-10-CM

## 2024-05-11 DIAGNOSIS — I509 Heart failure, unspecified: Principal | ICD-10-CM

## 2024-05-11 DIAGNOSIS — E876 Hypokalemia: Secondary | ICD-10-CM | POA: Diagnosis present

## 2024-05-11 DIAGNOSIS — R918 Other nonspecific abnormal finding of lung field: Secondary | ICD-10-CM | POA: Diagnosis not present

## 2024-05-11 DIAGNOSIS — Z888 Allergy status to other drugs, medicaments and biological substances status: Secondary | ICD-10-CM

## 2024-05-11 DIAGNOSIS — J90 Pleural effusion, not elsewhere classified: Secondary | ICD-10-CM | POA: Diagnosis not present

## 2024-05-11 DIAGNOSIS — Z9049 Acquired absence of other specified parts of digestive tract: Secondary | ICD-10-CM

## 2024-05-11 DIAGNOSIS — Y846 Urinary catheterization as the cause of abnormal reaction of the patient, or of later complication, without mention of misadventure at the time of the procedure: Secondary | ICD-10-CM | POA: Diagnosis present

## 2024-05-11 DIAGNOSIS — R338 Other retention of urine: Secondary | ICD-10-CM

## 2024-05-11 DIAGNOSIS — I5032 Chronic diastolic (congestive) heart failure: Secondary | ICD-10-CM | POA: Diagnosis present

## 2024-05-11 DIAGNOSIS — B379 Candidiasis, unspecified: Secondary | ICD-10-CM | POA: Diagnosis present

## 2024-05-11 DIAGNOSIS — K219 Gastro-esophageal reflux disease without esophagitis: Secondary | ICD-10-CM | POA: Diagnosis present

## 2024-05-11 DIAGNOSIS — N134 Hydroureter: Secondary | ICD-10-CM | POA: Diagnosis present

## 2024-05-11 DIAGNOSIS — N12 Tubulo-interstitial nephritis, not specified as acute or chronic: Secondary | ICD-10-CM | POA: Diagnosis present

## 2024-05-11 DIAGNOSIS — R600 Localized edema: Secondary | ICD-10-CM | POA: Diagnosis not present

## 2024-05-11 DIAGNOSIS — Z800 Family history of malignant neoplasm of digestive organs: Secondary | ICD-10-CM

## 2024-05-11 DIAGNOSIS — E1110 Type 2 diabetes mellitus with ketoacidosis without coma: Principal | ICD-10-CM | POA: Diagnosis present

## 2024-05-11 DIAGNOSIS — Z7902 Long term (current) use of antithrombotics/antiplatelets: Secondary | ICD-10-CM

## 2024-05-11 DIAGNOSIS — I5033 Acute on chronic diastolic (congestive) heart failure: Secondary | ICD-10-CM | POA: Diagnosis present

## 2024-05-11 LAB — I-STAT VENOUS BLOOD GAS, ED
Acid-Base Excess: 2 mmol/L (ref 0.0–2.0)
Bicarbonate: 26.3 mmol/L (ref 20.0–28.0)
Calcium, Ion: 1.07 mmol/L — ABNORMAL LOW (ref 1.15–1.40)
HCT: 28 % — ABNORMAL LOW (ref 36.0–46.0)
Hemoglobin: 9.5 g/dL — ABNORMAL LOW (ref 12.0–15.0)
O2 Saturation: 77 %
Potassium: 3 mmol/L — ABNORMAL LOW (ref 3.5–5.1)
Sodium: 137 mmol/L (ref 135–145)
TCO2: 28 mmol/L (ref 22–32)
pCO2, Ven: 40.2 mmHg — ABNORMAL LOW (ref 44–60)
pH, Ven: 7.424 (ref 7.25–7.43)
pO2, Ven: 41 mmHg (ref 32–45)

## 2024-05-11 LAB — CBC WITH DIFFERENTIAL/PLATELET
Abs Immature Granulocytes: 0.03 K/uL (ref 0.00–0.07)
Basophils Absolute: 0 K/uL (ref 0.0–0.1)
Basophils Relative: 0 %
Eosinophils Absolute: 0 K/uL (ref 0.0–0.5)
Eosinophils Relative: 0 %
HCT: 28.8 % — ABNORMAL LOW (ref 36.0–46.0)
Hemoglobin: 9.1 g/dL — ABNORMAL LOW (ref 12.0–15.0)
Immature Granulocytes: 0 %
Lymphocytes Relative: 13 %
Lymphs Abs: 1.1 K/uL (ref 0.7–4.0)
MCH: 25.9 pg — ABNORMAL LOW (ref 26.0–34.0)
MCHC: 31.6 g/dL (ref 30.0–36.0)
MCV: 82.1 fL (ref 80.0–100.0)
Monocytes Absolute: 0.4 K/uL (ref 0.1–1.0)
Monocytes Relative: 5 %
Neutro Abs: 6.9 K/uL (ref 1.7–7.7)
Neutrophils Relative %: 82 %
Platelets: 432 K/uL — ABNORMAL HIGH (ref 150–400)
RBC: 3.51 MIL/uL — ABNORMAL LOW (ref 3.87–5.11)
RDW: 15.8 % — ABNORMAL HIGH (ref 11.5–15.5)
WBC: 8.5 K/uL (ref 4.0–10.5)
nRBC: 0 % (ref 0.0–0.2)

## 2024-05-11 LAB — COMPREHENSIVE METABOLIC PANEL WITH GFR
ALT: 11 U/L (ref 0–44)
AST: 18 U/L (ref 15–41)
Albumin: 2.8 g/dL — ABNORMAL LOW (ref 3.5–5.0)
Alkaline Phosphatase: 127 U/L — ABNORMAL HIGH (ref 38–126)
Anion gap: 18 — ABNORMAL HIGH (ref 5–15)
BUN: 11 mg/dL (ref 8–23)
CO2: 23 mmol/L (ref 22–32)
Calcium: 8.6 mg/dL — ABNORMAL LOW (ref 8.9–10.3)
Chloride: 96 mmol/L — ABNORMAL LOW (ref 98–111)
Creatinine, Ser: 0.95 mg/dL (ref 0.44–1.00)
GFR, Estimated: >60 mL/min (ref >60)
Glucose, Bld: 441 mg/dL — ABNORMAL HIGH (ref 70–99)
Potassium: 3.1 mmol/L — ABNORMAL LOW (ref 3.5–5.1)
Sodium: 137 mmol/L (ref 135–145)
Total Bilirubin: 0.4 mg/dL (ref 0.0–1.2)
Total Protein: 7.7 g/dL (ref 6.5–8.1)

## 2024-05-11 LAB — CBG MONITORING, ED
Glucose-Capillary: 378 mg/dL — ABNORMAL HIGH (ref 70–99)
Glucose-Capillary: 388 mg/dL — ABNORMAL HIGH (ref 70–99)

## 2024-05-11 LAB — I-STAT CHEM 8, ED
BUN: 10 mg/dL (ref 8–23)
Calcium, Ion: 1.07 mmol/L — ABNORMAL LOW (ref 1.15–1.40)
Chloride: 97 mmol/L — ABNORMAL LOW (ref 98–111)
Creatinine, Ser: 0.8 mg/dL (ref 0.44–1.00)
Glucose, Bld: 469 mg/dL — ABNORMAL HIGH (ref 70–99)
HCT: 29 % — ABNORMAL LOW (ref 36.0–46.0)
Hemoglobin: 9.9 g/dL — ABNORMAL LOW (ref 12.0–15.0)
Potassium: 3 mmol/L — ABNORMAL LOW (ref 3.5–5.1)
Sodium: 137 mmol/L (ref 135–145)
TCO2: 24 mmol/L (ref 22–32)

## 2024-05-11 LAB — RESP PANEL BY RT-PCR (RSV, FLU A&B, COVID)  RVPGX2
Influenza A by PCR: NEGATIVE
Influenza B by PCR: NEGATIVE
Resp Syncytial Virus by PCR: NEGATIVE
SARS Coronavirus 2 by RT PCR: NEGATIVE

## 2024-05-11 LAB — TROPONIN I (HIGH SENSITIVITY): Troponin I (High Sensitivity): 25 ng/L — ABNORMAL HIGH (ref <18)

## 2024-05-11 LAB — BETA-HYDROXYBUTYRIC ACID: Beta-Hydroxybutyric Acid: 0.22 mmol/L (ref 0.05–0.27)

## 2024-05-11 LAB — BRAIN NATRIURETIC PEPTIDE: B Natriuretic Peptide: 2255.1 pg/mL — ABNORMAL HIGH (ref 0.0–100.0)

## 2024-05-11 MED ORDER — INSULIN ASPART 100 UNIT/ML IJ SOLN: 6.0000 [IU] | Freq: Once | INTRAMUSCULAR | Status: DC

## 2024-05-11 MED ORDER — SODIUM CHLORIDE 0.9 % IV SOLN: 2.0000 g | Freq: Once | INTRAVENOUS | Status: DC

## 2024-05-11 MED ORDER — POTASSIUM CHLORIDE CRYS ER 20 MEQ PO TBCR
40.0000 meq | EXTENDED_RELEASE_TABLET | Freq: Once | ORAL | Status: AC
  Administered 2024-05-11: 40 meq via ORAL
  Filled 2024-05-11: qty 2

## 2024-05-11 MED ORDER — LACTATED RINGERS IV BOLUS
1000.0000 mL | Freq: Once | INTRAVENOUS | Status: AC
  Administered 2024-05-11: 1000 mL via INTRAVENOUS

## 2024-05-11 MED ORDER — IOHEXOL 350 MG/ML SOLN
75.0000 mL | Freq: Once | INTRAVENOUS | Status: AC | PRN
Start: 2024-05-11 — End: 2024-05-11
  Administered 2024-05-11: 75 mL via INTRAVENOUS

## 2024-05-11 NOTE — Telephone Encounter (Signed)
 Noted

## 2024-05-11 NOTE — ED Triage Notes (Addendum)
 Pt BIBA from home w/ c/o SOB since Saturday, worse today w/ exertion. Pt also diabetic and has not taken insulin  X 2 days, BG 500 per EMS. Pt reports BLE edema. Pt also has chronic foley, reports lower abdominal pressure and reduced UOP.

## 2024-05-11 NOTE — ED Provider Notes (Signed)
 [place] EMERGENCY DEPARTMENT AT [facility] Provider Note   CSN: [id number] Arrival date & time: 05/11/24  2047     Patient presents with: Shortness of Breath   Brooke [name] is a 63 y.o. female.  With a history of type 2 diabetes on insulin , hypertension, heart failure and hypokalemia who presents to the ED for shortness of breath.  Patient has experienced worsening shortness of breath over the last 4 to 5 days as well as increasing bilateral lower extremity edema.  She also reports an issue with her indwelling Foley catheter with leaking at the insertion site and decreased urinary output.  Lower abdominal pain worse on the left side.  She has a history of a reflexive bladder.  Recently underwent cystoscopy with Dr. Selma (urology) with exchange of the right ureteral stent on September 22.  No fevers chills chest pain nausea vomiting.    Shortness of Breath      Prior to Admission medications   Medication Sig Start Date End Date Taking? Authorizing Provider  acetaminophen  (TYLENOL ) 500 MG tablet Take 1,000 mg by mouth every 6 (six) hours as needed for headache or fever (pain).    [provider]  aspirin  81 MG chewable tablet Chew 1 tablet (81 mg total) by mouth daily. Patient taking differently: Chew 81 mg by mouth at bedtime. 12/15/21   Bensimhon, Toribio SAUNDERS, MD  atorvastatin  (LIPITOR ) 80 MG tablet Take 1 tablet (80 mg total) by mouth daily. Patient taking differently: Take 80 mg by mouth at bedtime. 03/15/24   Newlin, Enobong, MD  cephALEXin  (KEFLEX ) 500 MG capsule Take 1 capsule (500 mg total) by mouth 2 (two) times daily. Take this prescription starting on 04/24/2024 Patient not taking: Reported on 04/20/2024 04/19/24     clopidogrel  (PLAVIX ) 75 MG tablet Take 1 tablet (75 mg total) by mouth daily. 03/13/24   Newlin, Enobong, MD  Continuous Glucose Receiver (DEXCOM G7 RECEIVER) DEVI Apply as directed. 03/05/24   Danford, Lonni SQUIBB, MD  Continuous Glucose Sensor  (DEXCOM G7 SENSOR) MISC Use to check blood sugar 3 times daily. 12/11/22   Newlin, Enobong, MD  fluconazole  (DIFLUCAN ) 200 MG tablet Take 1 tablet (200 mg total) by mouth daily. 05/09/24     Glucose Blood (BLOOD GLUCOSE TEST STRIPS) STRP 1 each by Does not apply route 3 (three) times daily. Use as directed to check blood sugar. May dispense any manufacturer covered by patient's insurance and fits patient's device. 03/05/24   Danford, Lonni SQUIBB, MD  insulin  aspart (NOVOLOG ) 100 UNIT/ML FlexPen Inject 3-14 Units into the skin 3 (three) times daily with meals. If eating and Blood Glucose (BG) 80 or higher inject 3 units for meal coverage and add correction dose per scale. If not eating, correction dose only. BG <150= 0 unit; BG 150-200= 1 unit; BG 201-250= 3 unit; BG 251-300= 5 unit; BG 301-350= 7 unit; BG 351-400= 9 unit; BG >400= 11 unit and Call Primary Care. 03/05/24   Danford, Lonni SQUIBB, MD  insulin  glargine (LANTUS ) 100 UNIT/ML Solostar Pen Inject 20 Units into the skin daily. Patient taking differently: Inject 18-20 Units into the skin See admin instructions. Inject 18u three times daily with meals and 20u at bedtime. 03/15/24   Newlin, Enobong, MD  Insulin  Pen Needle (PEN NEEDLES) 31G X 5 MM MISC Use 3 times a day. 03/05/24   Danford, Lonni SQUIBB, MD  Iron , Ferrous Sulfate , 325 (65 Fe) MG TABS Take 325 mg by mouth daily. 03/16/24   Newlin,  Enobong, MD  Lancet Device MISC 1 each by Does not apply route 3 (three) times daily. May dispense any manufacturer covered by patient's insurance. 03/05/24   Danford, Lonni SQUIBB, MD  metoprolol  succinate (TOPROL -XL) 25 MG 24 hr tablet Take 0.5 tablets (12.5 mg total) by mouth daily. Take with or immediately following a meal. 03/15/24   Newlin, Enobong, MD  oxyCODONE -acetaminophen  (PERCOCET) 5-325 MG tablet Take 1 tablet by mouth every 4 (four) hours as needed for severe pain (pain score 7-10). 03/05/24 03/05/25  Danford, Lonni SQUIBB, MD  sacubitril -valsartan   (ENTRESTO ) 49-51 MG Take 1 tablet by mouth 2 (two) times daily. 05/06/23   Newlin, Enobong, MD  spironolactone  (ALDACTONE ) 25 MG tablet Take 1 tablet (25 mg total) by mouth daily. Patient not taking: Reported on 04/20/2024 11/17/22   Newlin, Enobong, MD  vitamin B-12 (CYANOCOBALAMIN) 1000 MCG tablet Take 3,000 mcg by mouth daily.    [provider]    Allergies: Glucophage  [metformin ], Bayer aspirin  Adolfo.Alba ], Darvocet [propoxyphene n-acetaminophen ], Darvon-n [propoxyphene], Ery-tab [erythromycin], Motrin [ibuprofen], and Neurontin [gabapentin]    Review of Systems  Respiratory:  Positive for shortness of breath.     Updated Vital Signs BP 126/84   Pulse 100   Temp 98.5 F (36.9 C) (Oral)   Resp 18   Ht 5' 4 (1.626 m)   Wt 53 kg   LMP 11/16/2014 (Approximate)   SpO2 99%   BMI 20.06 kg/m   Physical Exam Vitals and nursing note reviewed.  HENT:     Head: Normocephalic and atraumatic.  Eyes:     Pupils: Pupils are equal, round, and reactive to light.  Cardiovascular:     Rate and Rhythm: Normal rate and regular rhythm.  Pulmonary:     Effort: Pulmonary effort is normal.     Breath sounds: Normal breath sounds.  Abdominal:     Palpations: Abdomen is soft.     Tenderness: There is abdominal tenderness.     Comments: Left lower quadrant tenderness no rebound rigidity guarding  Genitourinary:    Comments: Foley catheter in place draining clear yellow urine collection bag Skin:    General: Skin is warm and dry.  Neurological:     Mental Status: She is alert.  Psychiatric:        Mood and Affect: Mood normal.     (all labs ordered are listed, but only abnormal results are displayed) Labs Reviewed  COMPREHENSIVE METABOLIC PANEL WITH GFR - Abnormal; Notable for the following components:      Result Value   Potassium 3.1 (*)    Chloride 96 (*)    Glucose, Bld 441 (*)    Calcium  8.6 (*)    Albumin 2.8 (*)    Alkaline Phosphatase 127 (*)    Anion gap 18 (*)     All other components within normal limits  CBC WITH DIFFERENTIAL/PLATELET - Abnormal; Notable for the following components:   RBC 3.51 (*)    Hemoglobin 9.1 (*)    HCT 28.8 (*)    MCH 25.9 (*)    RDW 15.8 (*)    Platelets 432 (*)    All other components within normal limits  BRAIN NATRIURETIC PEPTIDE - Abnormal; Notable for the following components:   B Natriuretic Peptide 2,255.1 (*)    All other components within normal limits  CBG MONITORING, ED - Abnormal; Notable for the following components:   Glucose-Capillary 388 (*)    All other components within normal limits  I-STAT CHEM 8, ED -  Abnormal; Notable for the following components:   Potassium 3.0 (*)    Chloride 97 (*)    Glucose, Bld 469 (*)    Calcium , Ion 1.07 (*)    Hemoglobin 9.9 (*)    HCT 29.0 (*)    All other components within normal limits  I-STAT VENOUS BLOOD GAS, ED - Abnormal; Notable for the following components:   pCO2, Ven 40.2 (*)    Potassium 3.0 (*)    Calcium , Ion 1.07 (*)    HCT 28.0 (*)    Hemoglobin 9.5 (*)    All other components within normal limits  TROPONIN I (HIGH SENSITIVITY) - Abnormal; Notable for the following components:   Troponin I (High Sensitivity) 25 (*)    All other components within normal limits  RESP PANEL BY RT-PCR (RSV, FLU A&B, COVID)  RVPGX2  BETA-HYDROXYBUTYRIC ACID  URINALYSIS, W/ REFLEX TO CULTURE (INFECTION SUSPECTED)  CBG MONITORING, ED  TROPONIN I (HIGH SENSITIVITY)    EKG: EKG Interpretation Date/Time:  Thursday May 11 2024 20:52:26 EDT Ventricular Rate:  95 PR Interval:  170 QRS Duration:  87 QT Interval:  353 QTC Calculation: 444 R Axis:   76  Text Interpretation: Sinus rhythm Confirmed by Nettie, April ([id number]) on 05/11/2024 11:06:14 PM  Radiology: [facility] Chest Portable 1 View Result Date: 05/11/2024 CLINICAL DATA:  hf sob EXAM: PORTABLE CHEST 1 VIEW COMPARISON:  Chest x-ray 03/02/2024 FINDINGS: The heart and mediastinal contours are within normal  limits. No focal consolidation. No pulmonary edema. Interval development of bilateral small pleural effusions. No pneumothorax. No acute osseous abnormality. IMPRESSION: Interval development of bilateral small pleural effusions. Electronically Signed   By: Morgane  Naveau M.D.   On: 05/11/2024 21:47     Procedures   Medications Ordered in the ED  lactated ringers  bolus 1,000 mL (1,000 mLs Intravenous New Bag/Given 05/11/24 2116)  potassium chloride  SA (KLOR-CON  M) CR tablet 40 mEq (40 mEq Oral Given 05/11/24 2225)  iohexol  (OMNIPAQUE ) 350 MG/ML injection 75 mL (75 mLs Intravenous Contrast Given 05/11/24 2302)    Clinical Course as of 05/11/24 2323  Thu May 11, 2024  2322 Laboratory workup notable for recurrent hypokalemia of 3.1.  Will provide oral repletion.  BNP elevated at 2200.  Small bilateral pleural effusions.  Remained stable on room air.  Initial blood glucose in the 400s.  Will recheck CBG after IV fluids.  LILLETTE Ozell Marine DO, am transitioning care of this patient to the oncoming provider pending CT abdomen pelvis reevaluation and disposition [MP]    Clinical Course User Index [MP] Marine Ozell LABOR, DO                                 Medical Decision Making 63 year old female with history as above presenting for increased shortness of breath peripheral edema as well as decreased urinary output and leaking from indwelling Foley.  Has not taken her insulin  at home for the last 2 days.  Hyperglycemic here.  Some abdominal tenderness.  Presentation concerning for potential DKA/HHS.  With reported shortness of breath and peripheral edema also concern for potential heart failure exacerbation.  Stable on room air.  With concern for DKA/HHS will provide 1 L of IV fluids and reassess volume status and respiratory status.  Will obtain chest x-ray EKG laboratory workup.  Will obtain CT abdomen pelvis given reported abdominal pain with left lower quadrant tenderness to look for acute  intra-abdominal process.  Regarding leaking around Foley insertion site, our nurse was able to flush the Foley without issue.  Low suspicion for obstruction.  She will need to follow-up with urology in the office for additional evaluation and potential removal of Foley.  Amount and/or Complexity of Data Reviewed Labs: ordered. Radiology: ordered.  Risk Prescription drug management.        Final diagnoses:  Hyperglycemia due to diabetes mellitus (HCC)  Congestive heart failure, unspecified HF chronicity, unspecified heart failure type (HCC)  Problem with Foley catheter, initial encounter    ED Discharge Orders     None          Pamella Ozell LABOR, DO 05/11/24 2323

## 2024-05-11 NOTE — Telephone Encounter (Signed)
 FYI Only or Action Required?: FYI only for provider.  Patient was last seen in primary care on 03/15/2024 by Newlin, Enobong, MD.  [facility] Nurse Triage reporting Leg Swelling and intermittent right chest pain  Symptoms began Saturday.  Interventions attempted: Nothing.  Symptoms are: stable.  Triage Disposition: See Physician Within 24 Hours// No openings- advised pt to go to [facility]  Patient/caregiver understands and will follow disposition?: n/a      Copied from CRM #[id number]. Topic: Clinical - Red Word Triage >> May 11, 2024 10:51 AM Myrick T wrote: Red Word that prompted transfer to Nurse Triage: patient called stated she had a fall on 8/13 and on Saturday night her legs started swelling up beyond her calf. It is warm to the touch and its difficult to walk Reason for Disposition  [1] MODERATE leg swelling (e.g., swelling extends up to knees) AND [2] new-onset or getting worse  Answer Assessment - Initial Assessment Questions 1. ONSET: When did the swelling start? (e.g., minutes, hours, days)     Saturday 2. LOCATION: What part of the leg is swollen?  Are both legs swollen or just one leg?     Both legs  3. SEVERITY: How bad is the swelling? (e.g., localized; mild, moderate, severe)     Knees down  mode 4. REDNESS: Is there redness or signs of infection?     no 5. PAIN: Is the swelling painful to touch? If Yes, ask: How painful is it?   (Scale 1-10; mild, moderate or severe)     Mild  6. FEVER: Do you have a fever? If Yes, ask: What is it, how was it measured, and when did it start?      no 7. CAUSE: What do you think is causing the leg swelling?     H/o fall on 03/22/24 8. MEDICAL HISTORY: Do you have a history of blood clots (e.g., DVT), cancer, heart failure, kidney disease, or liver failure?     *No Answer* 10. OTHER SYMPTOMS: Do you have any other symptoms? (e.g., chest pain, difficulty breathing)       Left chest pain  Answer Assessment - Initial  Assessment Questions 1. LOCATION: Where does it hurt?     Under left breast  2. RADIATION: Does the pain go anywhere else? (e.g., into neck, jaw, arms, back)     no 3. ONSET: When did the chest pain begin? (Minutes, hours or days)      Sat 4. PATTERN: Does the pain come and go, or has it been constant since it started?  Does it get worse with exertion?      Comes and goes  5. DURATION: How long does it last (e.g., seconds, minutes, hours)     2 minute 6. SEVERITY: How bad is the pain?  (e.g., Scale 1-10; mild, moderate, or severe)     Mild  7. CARDIAC RISK FACTORS: Do you have any history of heart problems or risk factors for heart disease? (e.g., angina, prior heart attack; diabetes, high blood pressure, high cholesterol, smoker, or strong family history of heart disease)     diabetes 8. PULMONARY RISK FACTORS: Do you have any history of lung disease?  (e.g., blood clots in lung, asthma, emphysema, birth control pills)     no 9. CAUSE: What do you think is causing the chest pain?     Diverticulitis ate popcorn thought piece was stuck  10. OTHER SYMPTOMS: Do you have any other symptoms? (e.g., dizziness, nausea, vomiting, sweating, fever,  difficulty breathing, cough)       Cough, nausea  Protocols used: Leg Swelling and Edema-A-AH, Chest Pain-A-AH

## 2024-05-11 NOTE — Telephone Encounter (Signed)
 Pt c/o swelling: STAT is pt has developed SOB within 24 hours  How much weight have you gained and in what time span?  No weight gain   If swelling, where is the swelling located?  From the top of knees and down   Are you currently taking a fluid pill?  Yes, but she has been off it for about   Are you currently SOB?  No   Do you have a log of your daily weights (if so, list)?  108 lbs the past 2.5 weeks   Have you gained 3 pounds in a day or 5 pounds in a week?   Have you traveled recently?  No

## 2024-05-11 NOTE — Telephone Encounter (Signed)
 Call to patient to discuss symptoms. Patient reports that she has been off her spironolactone  since August when she had an ED visit for bladder problems. She was admitted to [facility] on 05/01/24 for cytoscopy, stent and hydronephrosis. She has had a foley catheter since her last visit and had a follow up visit on 05/09/24, and her foley catheter was working well at that time. Her next appt with urology is 05/17/24.   Unfortunately, she also reports her legs are swollen and she is SOB on exertion. She has been able to elevate her legs with some improvement.  She says she went to Urgent Care today for the leg swelling and they told her to see her cardiologist. However, since her appt on 9/30, she reports that she is having bladder pain and no urine is coming out of her foley catheter. She is urinating and having incontinence but she reports nothing is going in her foley bag.  Advised patient to present to ED for help with foley catheter. Explained that spironolactone  could be potentially be restarted once foley is working again, or leg swelling could be treated in ED. Patient verbalizes understanding and agrees to plan.

## 2024-05-12 ENCOUNTER — Ambulatory Visit: Admitting: Cardiology

## 2024-05-12 ENCOUNTER — Encounter (HOSPITAL_COMMUNITY): Payer: Self-pay | Admitting: Internal Medicine

## 2024-05-12 DIAGNOSIS — Z1152 Encounter for screening for COVID-19: Secondary | ICD-10-CM | POA: Diagnosis not present

## 2024-05-12 DIAGNOSIS — E1065 Type 1 diabetes mellitus with hyperglycemia: Secondary | ICD-10-CM | POA: Diagnosis not present

## 2024-05-12 DIAGNOSIS — R1314 Dysphagia, pharyngoesophageal phase: Secondary | ICD-10-CM | POA: Diagnosis present

## 2024-05-12 DIAGNOSIS — N12 Tubulo-interstitial nephritis, not specified as acute or chronic: Secondary | ICD-10-CM | POA: Diagnosis not present

## 2024-05-12 DIAGNOSIS — Z833 Family history of diabetes mellitus: Secondary | ICD-10-CM | POA: Diagnosis not present

## 2024-05-12 DIAGNOSIS — Z800 Family history of malignant neoplasm of digestive organs: Secondary | ICD-10-CM | POA: Diagnosis not present

## 2024-05-12 DIAGNOSIS — Z8249 Family history of ischemic heart disease and other diseases of the circulatory system: Secondary | ICD-10-CM | POA: Diagnosis not present

## 2024-05-12 DIAGNOSIS — D638 Anemia in other chronic diseases classified elsewhere: Secondary | ICD-10-CM | POA: Diagnosis present

## 2024-05-12 DIAGNOSIS — Z886 Allergy status to analgesic agent status: Secondary | ICD-10-CM | POA: Diagnosis not present

## 2024-05-12 DIAGNOSIS — E1110 Type 2 diabetes mellitus with ketoacidosis without coma: Secondary | ICD-10-CM | POA: Diagnosis present

## 2024-05-12 DIAGNOSIS — Z888 Allergy status to other drugs, medicaments and biological substances status: Secondary | ICD-10-CM | POA: Diagnosis not present

## 2024-05-12 DIAGNOSIS — E876 Hypokalemia: Secondary | ICD-10-CM

## 2024-05-12 DIAGNOSIS — T83518A Infection and inflammatory reaction due to other urinary catheter, initial encounter: Secondary | ICD-10-CM | POA: Diagnosis present

## 2024-05-12 DIAGNOSIS — Z9049 Acquired absence of other specified parts of digestive tract: Secondary | ICD-10-CM | POA: Diagnosis not present

## 2024-05-12 DIAGNOSIS — I2510 Atherosclerotic heart disease of native coronary artery without angina pectoris: Secondary | ICD-10-CM | POA: Diagnosis present

## 2024-05-12 DIAGNOSIS — N134 Hydroureter: Secondary | ICD-10-CM | POA: Diagnosis present

## 2024-05-12 DIAGNOSIS — E785 Hyperlipidemia, unspecified: Secondary | ICD-10-CM | POA: Diagnosis present

## 2024-05-12 DIAGNOSIS — K219 Gastro-esophageal reflux disease without esophagitis: Secondary | ICD-10-CM | POA: Diagnosis present

## 2024-05-12 DIAGNOSIS — Z7982 Long term (current) use of aspirin: Secondary | ICD-10-CM | POA: Diagnosis not present

## 2024-05-12 DIAGNOSIS — E1010 Type 1 diabetes mellitus with ketoacidosis without coma: Secondary | ICD-10-CM

## 2024-05-12 DIAGNOSIS — Y846 Urinary catheterization as the cause of abnormal reaction of the patient, or of later complication, without mention of misadventure at the time of the procedure: Secondary | ICD-10-CM | POA: Diagnosis present

## 2024-05-12 DIAGNOSIS — I509 Heart failure, unspecified: Secondary | ICD-10-CM | POA: Diagnosis not present

## 2024-05-12 DIAGNOSIS — I5032 Chronic diastolic (congestive) heart failure: Secondary | ICD-10-CM | POA: Diagnosis present

## 2024-05-12 DIAGNOSIS — I5033 Acute on chronic diastolic (congestive) heart failure: Secondary | ICD-10-CM | POA: Diagnosis present

## 2024-05-12 DIAGNOSIS — E1151 Type 2 diabetes mellitus with diabetic peripheral angiopathy without gangrene: Secondary | ICD-10-CM | POA: Diagnosis present

## 2024-05-12 DIAGNOSIS — Z7902 Long term (current) use of antithrombotics/antiplatelets: Secondary | ICD-10-CM | POA: Diagnosis not present

## 2024-05-12 DIAGNOSIS — Z794 Long term (current) use of insulin: Secondary | ICD-10-CM | POA: Diagnosis not present

## 2024-05-12 DIAGNOSIS — E0810 Diabetes mellitus due to underlying condition with ketoacidosis without coma: Secondary | ICD-10-CM | POA: Diagnosis not present

## 2024-05-12 DIAGNOSIS — I110 Hypertensive heart disease with heart failure: Secondary | ICD-10-CM | POA: Diagnosis present

## 2024-05-12 DIAGNOSIS — N136 Pyonephrosis: Secondary | ICD-10-CM | POA: Diagnosis present

## 2024-05-12 DIAGNOSIS — Z885 Allergy status to narcotic agent status: Secondary | ICD-10-CM | POA: Diagnosis not present

## 2024-05-12 DIAGNOSIS — N312 Flaccid neuropathic bladder, not elsewhere classified: Secondary | ICD-10-CM | POA: Diagnosis present

## 2024-05-12 LAB — URINALYSIS, W/ REFLEX TO CULTURE (INFECTION SUSPECTED)
Bilirubin Urine: NEGATIVE
Glucose, UA: >=500 mg/dL — AB
Ketones, ur: NEGATIVE mg/dL
Nitrite: NEGATIVE
Protein, ur: 100 mg/dL — AB
Specific Gravity, Urine: 1.007 (ref 1.005–1.030)
WBC, UA: >50 WBC/hpf (ref 0–5)
pH: 5 (ref 5.0–8.0)

## 2024-05-12 LAB — I-STAT CHEM 8, ED
BUN: 10 mg/dL (ref 8–23)
Calcium, Ion: 1.07 mmol/L — ABNORMAL LOW (ref 1.15–1.40)
Chloride: 98 mmol/L (ref 98–111)
Creatinine, Ser: 0.8 mg/dL (ref 0.44–1.00)
Glucose, Bld: 397 mg/dL — ABNORMAL HIGH (ref 70–99)
HCT: 27 % — ABNORMAL LOW (ref 36.0–46.0)
Hemoglobin: 9.2 g/dL — ABNORMAL LOW (ref 12.0–15.0)
Potassium: 3.4 mmol/L — ABNORMAL LOW (ref 3.5–5.1)
Sodium: 137 mmol/L (ref 135–145)
TCO2: 22 mmol/L (ref 22–32)

## 2024-05-12 LAB — BASIC METABOLIC PANEL WITH GFR
Anion gap: 13 (ref 5–15)
Anion gap: 16 — ABNORMAL HIGH (ref 5–15)
Anion gap: 12 (ref 5–15)
Anion gap: 11 (ref 5–15)
Anion gap: 11 (ref 5–15)
BUN: 11 mg/dL (ref 8–23)
BUN: 12 mg/dL (ref 8–23)
BUN: 13 mg/dL (ref 8–23)
BUN: 13 mg/dL (ref 8–23)
BUN: 12 mg/dL (ref 8–23)
CO2: 23 mmol/L (ref 22–32)
CO2: 21 mmol/L — ABNORMAL LOW (ref 22–32)
CO2: 21 mmol/L — ABNORMAL LOW (ref 22–32)
CO2: 22 mmol/L (ref 22–32)
CO2: 23 mmol/L (ref 22–32)
Calcium: 8.1 mg/dL — ABNORMAL LOW (ref 8.9–10.3)
Calcium: 8.5 mg/dL — ABNORMAL LOW (ref 8.9–10.3)
Calcium: 8.5 mg/dL — ABNORMAL LOW (ref 8.9–10.3)
Calcium: 8.3 mg/dL — ABNORMAL LOW (ref 8.9–10.3)
Calcium: 8.3 mg/dL — ABNORMAL LOW (ref 8.9–10.3)
Chloride: 98 mmol/L (ref 98–111)
Chloride: 103 mmol/L (ref 98–111)
Chloride: 103 mmol/L (ref 98–111)
Chloride: 104 mmol/L (ref 98–111)
Chloride: 104 mmol/L (ref 98–111)
Creatinine, Ser: 0.9 mg/dL (ref 0.44–1.00)
Creatinine, Ser: 0.84 mg/dL (ref 0.44–1.00)
Creatinine, Ser: 0.92 mg/dL (ref 0.44–1.00)
Creatinine, Ser: 0.79 mg/dL (ref 0.44–1.00)
Creatinine, Ser: 0.82 mg/dL (ref 0.44–1.00)
GFR, Estimated: >60 mL/min (ref >60)
GFR, Estimated: >60 mL/min (ref >60)
GFR, Estimated: >60 mL/min (ref >60)
GFR, Estimated: >60 mL/min (ref >60)
GFR, Estimated: >60 mL/min (ref >60)
Glucose, Bld: 395 mg/dL — ABNORMAL HIGH (ref 70–99)
Glucose, Bld: 170 mg/dL — ABNORMAL HIGH (ref 70–99)
Glucose, Bld: 125 mg/dL — ABNORMAL HIGH (ref 70–99)
Glucose, Bld: 132 mg/dL — ABNORMAL HIGH (ref 70–99)
Glucose, Bld: 91 mg/dL (ref 70–99)
Potassium: 3.3 mmol/L — ABNORMAL LOW (ref 3.5–5.1)
Potassium: 3.5 mmol/L (ref 3.5–5.1)
Potassium: 4.4 mmol/L (ref 3.5–5.1)
Potassium: 4.4 mmol/L (ref 3.5–5.1)
Potassium: 4 mmol/L (ref 3.5–5.1)
Sodium: 134 mmol/L — ABNORMAL LOW (ref 135–145)
Sodium: 140 mmol/L (ref 135–145)
Sodium: 136 mmol/L (ref 135–145)
Sodium: 137 mmol/L (ref 135–145)
Sodium: 138 mmol/L (ref 135–145)

## 2024-05-12 LAB — MAGNESIUM: Magnesium: 1.9 mg/dL (ref 1.7–2.4)

## 2024-05-12 LAB — CBC WITH DIFFERENTIAL/PLATELET
Abs Immature Granulocytes: 0.03 K/uL (ref 0.00–0.07)
Basophils Absolute: 0 K/uL (ref 0.0–0.1)
Basophils Relative: 0 %
Eosinophils Absolute: 0 K/uL (ref 0.0–0.5)
Eosinophils Relative: 0 %
HCT: 26.5 % — ABNORMAL LOW (ref 36.0–46.0)
Hemoglobin: 7.9 g/dL — ABNORMAL LOW (ref 12.0–15.0)
Immature Granulocytes: 0 %
Lymphocytes Relative: 19 %
Lymphs Abs: 1.8 K/uL (ref 0.7–4.0)
MCH: 24.8 pg — ABNORMAL LOW (ref 26.0–34.0)
MCHC: 29.8 g/dL — ABNORMAL LOW (ref 30.0–36.0)
MCV: 83.1 fL (ref 80.0–100.0)
Monocytes Absolute: 0.5 K/uL (ref 0.1–1.0)
Monocytes Relative: 6 %
Neutro Abs: 6.8 K/uL (ref 1.7–7.7)
Neutrophils Relative %: 75 %
Platelets: 358 K/uL (ref 150–400)
RBC: 3.19 MIL/uL — ABNORMAL LOW (ref 3.87–5.11)
RDW: 16 % — ABNORMAL HIGH (ref 11.5–15.5)
WBC: 9.2 K/uL (ref 4.0–10.5)
nRBC: 0 % (ref 0.0–0.2)

## 2024-05-12 LAB — TROPONIN I (HIGH SENSITIVITY): Troponin I (High Sensitivity): 31 ng/L — ABNORMAL HIGH (ref <18)

## 2024-05-12 LAB — MRSA NEXT GEN BY PCR, NASAL: MRSA by PCR Next Gen: NOT DETECTED

## 2024-05-12 LAB — PROTIME-INR
INR: 1.3 — ABNORMAL HIGH (ref 0.8–1.2)
Prothrombin Time: 16.5 s — ABNORMAL HIGH (ref 11.4–15.2)

## 2024-05-12 LAB — GLUCOSE, CAPILLARY
Glucose-Capillary: 169 mg/dL — ABNORMAL HIGH (ref 70–99)
Glucose-Capillary: 150 mg/dL — ABNORMAL HIGH (ref 70–99)
Glucose-Capillary: 150 mg/dL — ABNORMAL HIGH (ref 70–99)
Glucose-Capillary: 112 mg/dL — ABNORMAL HIGH (ref 70–99)
Glucose-Capillary: 116 mg/dL — ABNORMAL HIGH (ref 70–99)
Glucose-Capillary: 129 mg/dL — ABNORMAL HIGH (ref 70–99)
Glucose-Capillary: 122 mg/dL — ABNORMAL HIGH (ref 70–99)
Glucose-Capillary: 131 mg/dL — ABNORMAL HIGH (ref 70–99)
Glucose-Capillary: 124 mg/dL — ABNORMAL HIGH (ref 70–99)
Glucose-Capillary: 128 mg/dL — ABNORMAL HIGH (ref 70–99)
Glucose-Capillary: 115 mg/dL — ABNORMAL HIGH (ref 70–99)
Glucose-Capillary: 118 mg/dL — ABNORMAL HIGH (ref 70–99)

## 2024-05-12 LAB — BETA-HYDROXYBUTYRIC ACID
Beta-Hydroxybutyric Acid: 0.26 mmol/L (ref 0.05–0.27)
Beta-Hydroxybutyric Acid: <0.05 mmol/L — ABNORMAL LOW (ref 0.05–0.27)
Beta-Hydroxybutyric Acid: <0.05 mmol/L — ABNORMAL LOW (ref 0.05–0.27)
Beta-Hydroxybutyric Acid: <0.05 mmol/L — ABNORMAL LOW (ref 0.05–0.27)
Beta-Hydroxybutyric Acid: <0.05 mmol/L — ABNORMAL LOW (ref 0.05–0.27)

## 2024-05-12 LAB — CBG MONITORING, ED
Glucose-Capillary: 392 mg/dL — ABNORMAL HIGH (ref 70–99)
Glucose-Capillary: 339 mg/dL — ABNORMAL HIGH (ref 70–99)
Glucose-Capillary: 234 mg/dL — ABNORMAL HIGH (ref 70–99)

## 2024-05-12 MED ORDER — ONDANSETRON HCL 4 MG/2ML IJ SOLN
4.0000 mg | Freq: Four times a day (QID) | INTRAMUSCULAR | Status: DC | PRN
  Administered 2024-05-14 – 2024-05-17 (×8): 4 mg via INTRAVENOUS
  Filled 2024-05-12 (×8): qty 2

## 2024-05-12 MED ORDER — ACETAMINOPHEN 650 MG RE SUPP: 650.0000 mg | Freq: Four times a day (QID) | RECTAL | Status: DC | PRN

## 2024-05-12 MED ORDER — SODIUM CHLORIDE 0.9 % IV SOLN
2.0000 g | Freq: Once | INTRAVENOUS | Status: AC
  Administered 2024-05-12: 2 g via INTRAVENOUS
  Filled 2024-05-12: qty 12.5

## 2024-05-12 MED ORDER — FUROSEMIDE 10 MG/ML IJ SOLN
40.0000 mg | Freq: Once | INTRAMUSCULAR | Status: DC
  Filled 2024-05-12: qty 4

## 2024-05-12 MED ORDER — INSULIN REGULAR(HUMAN) IN NACL 100-0.9 UT/100ML-% IV SOLN
INTRAVENOUS | Status: DC
  Administered 2024-05-12: 7.5 [IU]/h via INTRAVENOUS
  Filled 2024-05-12: qty 100

## 2024-05-12 MED ORDER — ONDANSETRON HCL 4 MG/2ML IJ SOLN
4.0000 mg | Freq: Once | INTRAMUSCULAR | Status: AC
  Administered 2024-05-12: 4 mg via INTRAVENOUS
  Filled 2024-05-12: qty 2

## 2024-05-12 MED ORDER — INSULIN GLARGINE-YFGN 100 UNIT/ML ~~LOC~~ SOPN: 20.0000 [IU] | PEN_INJECTOR | SUBCUTANEOUS | Status: DC

## 2024-05-12 MED ORDER — INSULIN ASPART 100 UNIT/ML IJ SOLN
0.0000 [IU] | Freq: Three times a day (TID) | INTRAMUSCULAR | Status: DC
  Administered 2024-05-13: 2 [IU] via SUBCUTANEOUS
  Administered 2024-05-13 – 2024-05-15 (×3): 1 [IU] via SUBCUTANEOUS
  Administered 2024-05-15: 2 [IU] via SUBCUTANEOUS
  Administered 2024-05-17: 1 [IU] via SUBCUTANEOUS

## 2024-05-12 MED ORDER — SODIUM CHLORIDE 0.9 % IV SOLN
2.0000 g | Freq: Three times a day (TID) | INTRAVENOUS | Status: DC
  Administered 2024-05-12: 2 g via INTRAVENOUS
  Filled 2024-05-12 (×2): qty 12.5

## 2024-05-12 MED ORDER — FLUCONAZOLE IN SODIUM CHLORIDE 200-0.9 MG/100ML-% IV SOLN
200.0000 mg | Freq: Once | INTRAVENOUS | Status: AC
  Administered 2024-05-12: 200 mg via INTRAVENOUS
  Filled 2024-05-12: qty 100

## 2024-05-12 MED ORDER — POTASSIUM CHLORIDE 10 MEQ/100ML IV SOLN
10.0000 meq | INTRAVENOUS | Status: AC
  Administered 2024-05-12 (×2): 10 meq via INTRAVENOUS
  Filled 2024-05-12 (×2): qty 100

## 2024-05-12 MED ORDER — CHLORHEXIDINE GLUCONATE CLOTH 2 % EX PADS
6.0000 | MEDICATED_PAD | Freq: Every day | CUTANEOUS | Status: DC
  Administered 2024-05-12 – 2024-05-17 (×6): 6 via TOPICAL

## 2024-05-12 MED ORDER — INSULIN GLARGINE 100 UNIT/ML ~~LOC~~ SOLN
20.0000 [IU] | Freq: Every day | SUBCUTANEOUS | Status: DC
Start: 2024-05-12 — End: 2024-05-14
  Administered 2024-05-12: 20 [IU] via SUBCUTANEOUS
  Filled 2024-05-12 (×3): qty 0.2

## 2024-05-12 MED ORDER — DEXTROSE IN LACTATED RINGERS 5 % IV SOLN: INTRAVENOUS | Status: AC

## 2024-05-12 MED ORDER — ACETAMINOPHEN 325 MG PO TABS
650.0000 mg | ORAL_TABLET | Freq: Four times a day (QID) | ORAL | Status: DC | PRN
  Administered 2024-05-12 – 2024-05-16 (×2): 650 mg via ORAL
  Filled 2024-05-12 (×2): qty 2

## 2024-05-12 MED ORDER — DEXTROSE 50 % IV SOLN: 0.0000 mL | INTRAVENOUS | Status: DC | PRN

## 2024-05-12 MED ORDER — ORAL CARE MOUTH RINSE: 15.0000 mL | OROMUCOSAL | Status: DC | PRN

## 2024-05-12 MED ORDER — POTASSIUM CHLORIDE CRYS ER 20 MEQ PO TBCR
20.0000 meq | EXTENDED_RELEASE_TABLET | Freq: Once | ORAL | Status: AC
  Administered 2024-05-12: 20 meq via ORAL
  Filled 2024-05-12: qty 1

## 2024-05-12 MED ORDER — CEFTRIAXONE SODIUM 2 G IJ SOLR
2.0000 g | INTRAMUSCULAR | Status: DC
  Administered 2024-05-12 – 2024-05-17 (×6): 2 g via INTRAVENOUS
  Filled 2024-05-12 (×6): qty 20

## 2024-05-12 MED ORDER — POTASSIUM CHLORIDE 10 MEQ/100ML IV SOLN
10.0000 meq | INTRAVENOUS | Status: AC
  Administered 2024-05-12 (×4): 10 meq via INTRAVENOUS
  Filled 2024-05-12 (×5): qty 100

## 2024-05-12 NOTE — Inpatient Diabetes Management (Signed)
 Inpatient Diabetes Program Recommendations  AACE/ADA: New Consensus Statement on Inpatient Glycemic Control (2015)  Target Ranges:  Prepandial:   less than 140 mg/dL      Peak postprandial:   less than 180 mg/dL (1-2 hours)      Critically ill patients:  140 - 180 mg/dL   Lab Results  Component Value Date   GLUCAP 116 (H) 05/12/2024   HGBA1C 10.7 (H) 04/20/2024    Review of Glycemic Control  Diabetes history: DM1 Outpatient Diabetes medications: pt states she takes Lantus  18 units at breakfast, 18 units at lunch and 20 units at dinner. Does take Novolog  sometimes if she's eating good PCP ordered Novolog  3 units with meals and s/s 1-11 units TID if BG > 80 mg/dL, Lantus  20 units QHS  Current orders for Inpatient glycemic control: IV insulin  per [facility] for DKA  PCP - Newlin States she has appt for f/u in November 2025 Uses Dexcom G7 sensor, does not have on.  Inpatient Diabetes Program Recommendations:    When criteria met for discontinuation of insulin  drip, give Lantus  1-2 hours before stopping the drip.  Consider Lantus  12-14 units daily  Novolog  0-9 TID with meals and 0-5 HS  Novolog  2 units TID with meals if eating > 50%  Pt seemed a little bit confused about her insulin  doses at home. Reviewed what PCP had prescribed on 03/15/24. I don't know whether she is using CGM. States she has used it, but can't verify. Also has glucose meter.  Will continue to follow.   Thank you. Shona Brandy, RD, LDN, CDCES Inpatient Diabetes Coordinator [phone]

## 2024-05-12 NOTE — ED Notes (Signed)
 Attempted to stick patient for blood cultures on R arm, attempt unsuccessful. RN notified.

## 2024-05-12 NOTE — Progress Notes (Signed)
 ED Pharmacy Antibiotic Sign Off An antibiotic consult was received from an ED provider for Cefepime per pharmacy dosing for UTI/pyelo. A chart review was completed to assess appropriateness.   The following one time order(s) were placed:  Cefepime 2g IV x 1 Fluconazole  200 mg IV x 1 placed by EDP  Further antibiotic and/or antibiotic pharmacy consults should be ordered by the admitting provider if indicated.   Lynwood Mckusick, PharmD, BCPS Clinical Pharmacist Phone: [phone]

## 2024-05-12 NOTE — ED Notes (Signed)
[facility] notified of transfer.

## 2024-05-12 NOTE — ED Notes (Signed)
 Per MD, foley catheter to be removed and replaced. Existing foley removed, upon pulling catheter all the way out string was noted to be connected to end of foley. MD to bedside, MD pulled catheter until stent was removed. Stent covered in purulent drainage. Pericare performed. New foley catheter inserted using sterile technique without issue. Large amount of purulent urine draining. Per MD, foley clamped at UOP 900 ml and to be clamped for one hour.

## 2024-05-12 NOTE — H&P (Signed)
 History and Physical      Brooke [name] FMW:[id number] DOB: 1961/07/13 DOA: 05/11/2024; DOS: 05/12/2024  PCP: [name], MD  Patient coming from: home   I have personally briefly reviewed patient's old medical records in [facility]  Chief Complaint: sob  HPI: Brooke [name] is a 63 y.o. female with medical history significant for poorly controlled type 1 diabetes mellitus,, chronic diastolic heart failure, who is admitted to [facility] on 05/11/2024 with DKA in the setting of right-sided pyelonephritis after presenting from home to [facility] ED complaining of shortness of breath.   The patient works 2 days shortness of breath, in the absence of orthopnea/PND, will also denies any associated increase in peripheral edema.  No associated chest pain, palpitations, this.  She has a history of poorly controlled type 1 diabetes mellitus, with most recent hemoglobin A1c level noted to be 10.7% on 04/20/2024.  In terms of urologic history, she recently underwent right ureteral stent placement in the setting of hydronephrosis, without evidence of ureteral stone.  This was performed by Dr. Selma of [facility].  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  However, over the last few days, she notes no urine output via the Foley catheter, but is noted purulent urine output around the Foley catheter.  Over this time, she has also noted development of subjective fever as well as worsening right flank discomfort.  Medical history also notable for chronic diastolic heart failure, with most recent echocardiogram in September 2023, which was notable for LVEF 55 to 60%, grade 1 diastolic dysfunction, as well as normal right ventricular systolic function.  Patient conveys that she recently independently stopped her Lasix  and spironolactone .    [facility] ED Course:  Vital signs in the ED were notable for the following: Afebrile; heart rates in the 90s to low  100s; systolic blood pressures in the 90s to 120s; respiratory rate 17-23, oxygen saturation 94 to 100% on room air.  Labs were notable for the following: CMP was notable for sodium 137, potassium 3.1, bicarbonate 23, anion gap 18, creatinine 0.95, glucose 441, calcium  adjusted for mild hypoalbuminemia noted to be 9.6, avidin 2.8.  Otherwise, liver enzymes within normal limits.  CBC notable for Vasocon 8500, hemoglobin 9.1, unchanged from most recent prior hemoglobin data point on 04/20/2024, platelet count 432.  Urinalysis notable for greater than 50 red blood cells, moderate leukocyte esterase.  Will cultures x 2 were collected prior to initiation of IV antibiotics.  COVID, influenza, RSV PCR were all negative.  VBG showed 7.42/40 point 2/41/20 6.3.  Per my interpretation, EKG in ED demonstrated the following: Sinus rhythm with heart rate 95, normal roles, no evidence of T wave changes, nonspecific less than 1 mm ST depression in leads II, V5, without any evidence of ST elevation.  Imaging in the ED, per corresponding formal radiology read, was notable for the following: 1 view chest x-ray showed small bilateral pleural effusions and without any evidence of interstitial pulmonary edema, no evidence of infiltrate, and no evidence of pneumothorax.  CT Abdo/pelvis with contrast showed persistent dilation of the right renal pelvis as well as marked right-sided hydroureter, with additional findings suggestive of right-sided pyelonephritis, in addition to distention of the urinary bladder in spite of placement of Foley catheter.  In the ED this evening, Foley catheter was exchanged, at which time there was ensuing urine output of 1.5 L of purulent urine.   EDP d/w on-call  urology fellow working with Dr. Selma, who recommends admission to [facility], where urology will formally consult, with anticipation of taking the patient to the OR for replacement of right ureteral stent.  Urology requested the patient be  kept NPO.  Additionally, urology requests that if patient spikes a fever, at IV vancomycin be added to existing cefepime.  While in the ED, the following were administered: Potassium chloride  20 mill colons p.o. x 1 dose, potassium chloride  40 mill equivalents p.o. x 1 dose, potassium chloride  20 mg [place]in[place] IV over 2 hours, cefepime, lactated Ringer 's other bolus, initiation of LR at 125 cc/h, single dose of Diflucan , as well as insulin  drip via Endo tool.  Subsequently, the patient was admitted to [facility] for further evaluation management of presenting DKA in the setting of right-sided pyelonephritis.     Review of Systems: As per HPI otherwise 10 point review of systems negative.   Past Medical History:  Diagnosis Date   Abdominal pain, left lower quadrant 12/17/2015   Anemia    Anxiety 07/01/2016   Carotid artery disease 10/27/2022   Chest pain, unspecified 10/05/2003   CHF (congestive heart failure) (HCC)    Chronic systolic heart failure (HCC) 02/20/2022   Depression, major, recurrent, moderate (HCC) 07/31/2005   Diabetes mellitus without complication (HCC) 09/08/2006   Endometriosis 03/28/1996   GERD (gastroesophageal reflux disease)    Heart murmur    History of colonic polyps 08/07/2015   20 mm removed 05/10/15 by Dr. Kristie     PAD (peripheral artery disease)    Unspecified constipation 06/24/2004    Past Surgical History:  Procedure Laterality Date   ABDOMINAL AORTOGRAM W/LOWER EXTREMITY Left 07/29/2023   Procedure: ABDOMINAL AORTOGRAM W/LOWER EXTREMITY;  Surgeon: Gretta Lonni PARAS, MD;  Location: [facility] INVASIVE CV LAB;  Service: Cardiovascular;  Laterality: Left;   APPENDECTOMY  08/10/1994   Dr. Buford   BREAST SURGERY Bilateral 08/10/1994   Dr. Ardeen   calcifications removed   CYSTOSCOPY W/ RETROGRADES Right 05/01/2024   Procedure: CYSTOSCOPY, WITH RETROGRADE PYELOGRAM;  Surgeon: Selma Donnice SAUNDERS, MD;  Location: [facility] ORS;  Service: Urology;  Laterality: Right;    CYSTOSCOPY W/ URETERAL STENT PLACEMENT Right 03/02/2024   Procedure: CYSTOSCOPY, WITH RETROGRADE PYELOGRAM AND URETERAL STENT INSERTION;  Surgeon: Selma Donnice SAUNDERS, MD;  Location: [facility] OR;  Service: Urology;  Laterality: Right;   CYSTOSCOPY WITH BIOPSY N/A 05/01/2024   Procedure: CYSTOSCOPY, WITH URETERAL BIOPSY;  Surgeon: Selma Donnice SAUNDERS, MD;  Location: [facility] ORS;  Service: Urology;  Laterality: N/A;   CYSTOSCOPY/URETEROSCOPY/HOLMIUM LASER/STENT PLACEMENT Right 05/01/2024   Procedure: CYSTOSCOPY/URETEROSCOPY/EXCHANGE;  Surgeon: Selma Donnice SAUNDERS, MD;  Location: [facility] ORS;  Service: Urology;  Laterality: Right;   ESOPHAGEAL MANOMETRY N/A 03/23/2023   Procedure: ESOPHAGEAL MANOMETRY (EM);  Surgeon: Leigh Elspeth SQUIBB, MD;  Location: [facility] ENDOSCOPY;  Service: Gastroenterology;  Laterality: N/A;   INGUINAL HERNIA REPAIR     OVARIAN CYST REMOVAL     PERIPHERAL VASCULAR BALLOON ANGIOPLASTY Left 07/29/2023   Procedure: PERIPHERAL VASCULAR BALLOON ANGIOPLASTY;  Surgeon: Gretta Lonni PARAS, MD;  Location: [facility] INVASIVE CV LAB;  Service: Cardiovascular;  Laterality: Left;   RIGHT/LEFT HEART CATH AND CORONARY ANGIOGRAPHY N/A 10/09/2021   Procedure: RIGHT/LEFT HEART CATH AND CORONARY ANGIOGRAPHY;  Surgeon: Wendel Lurena POUR, MD;  Location: [facility] INVASIVE CV LAB;  Service: Cardiovascular;  Laterality: N/A;   TOOTH EXTRACTION N/A 07/28/2022   Procedure: EXTRACTION ALL REMAING TEETH THREE, FOUR, FIVE, SIX, SEVEN, EIGHT, NINE, TEN, ELEVEN, TWELVE, TWENTY ONE, TWENTY TWO,  TWENTY THREE, TWENTY FOUR, TWENTY FIVE, TWENTY SIX, TWENTY SEVEN, TWENTY EIGHT, TWENTY NINE, THIRTY, AVEOLOPLASTY;  Surgeon: Sheryle Hamilton, DMD;  Location: [facility] OR;  Service: Oral Surgery;  Laterality: N/A;    Social History:  reports that she has never smoked. She has never used smokeless tobacco. She reports that she does not drink alcohol and does not use drugs.   Allergies  Allergen Reactions   Glucophage  [Metformin ] Diarrhea, Nausea Only and Other (See  Comments)    General GI intolerance    Bayer Aspirin  [Aspirin ] Other (See Comments)    Stomach pain occurs with 325mg  dose, able to tolerate 81mg .   Darvocet [Propoxyphene N-Acetaminophen ] Rash   Darvon-N [Propoxyphene] Rash   Ery-Tab [Erythromycin] Nausea And Vomiting   Motrin [Ibuprofen] Other (See Comments)    Stomach pain   Neurontin [Gabapentin] Other (See Comments)    Suicidal ideations     Family History  Problem Relation Age of Onset   Diabetes Mother    Hypertension Mother    Arthritis Mother        RA   Pancreatic cancer Father    Stomach cancer Neg Hx    Colon cancer Neg Hx    Esophageal cancer Neg Hx    Rectal cancer Neg Hx     Family history reviewed and not pertinent    Prior to Admission medications   Medication Sig Start Date End Date Taking? Authorizing Provider  acetaminophen  (TYLENOL ) 500 MG tablet Take 1,000 mg by mouth every 6 (six) hours as needed for headache or fever (pain).    [provider]  aspirin  81 MG chewable tablet Chew 1 tablet (81 mg total) by mouth daily. Patient taking differently: Chew 81 mg by mouth at bedtime. 12/15/21   Bensimhon, Toribio SAUNDERS, MD  atorvastatin  (LIPITOR ) 80 MG tablet Take 1 tablet (80 mg total) by mouth daily. Patient taking differently: Take 80 mg by mouth at bedtime. 03/15/24   Newlin, Enobong, MD  clopidogrel  (PLAVIX ) 75 MG tablet Take 1 tablet (75 mg total) by mouth daily. 03/13/24   Newlin, Enobong, MD  Continuous Glucose Receiver (DEXCOM G7 RECEIVER) DEVI Apply as directed. 03/05/24   Danford, Lonni SQUIBB, MD  fluconazole  (DIFLUCAN ) 200 MG tablet Take 1 tablet (200 mg total) by mouth daily. 05/09/24     Glucose Blood (BLOOD GLUCOSE TEST STRIPS) STRP 1 each by Does not apply route 3 (three) times daily. Use as directed to check blood sugar. May dispense any manufacturer covered by patient's insurance and fits patient's device. 03/05/24   Danford, Lonni SQUIBB, MD  insulin  aspart (NOVOLOG ) 100 UNIT/ML FlexPen Inject  3-14 Units into the skin 3 (three) times daily with meals. If eating and Blood Glucose (BG) 80 or higher inject 3 units for meal coverage and add correction dose per scale. If not eating, correction dose only. BG <150= 0 unit; BG 150-200= 1 unit; BG 201-250= 3 unit; BG 251-300= 5 unit; BG 301-350= 7 unit; BG 351-400= 9 unit; BG >400= 11 unit and Call Primary Care. 03/05/24   Danford, Lonni SQUIBB, MD  insulin  glargine (LANTUS ) 100 UNIT/ML Solostar Pen Inject 20 Units into the skin daily. Patient taking differently: Inject 18-20 Units into the skin See admin instructions. Inject 18u three times daily with meals and 20u at bedtime. 03/15/24   Newlin, Enobong, MD  Insulin  Pen Needle (PEN NEEDLES) 31G X 5 MM MISC Use 3 times a day. 03/05/24   Danford, Lonni SQUIBB, MD  Iron , Ferrous Sulfate , 325 (65 Fe)  MG TABS Take 325 mg by mouth daily. 03/16/24   Newlin, Enobong, MD  Lancet Device MISC 1 each by Does not apply route 3 (three) times daily. May dispense any manufacturer covered by patient's insurance. 03/05/24   Danford, Lonni SQUIBB, MD  metoprolol  succinate (TOPROL -XL) 25 MG 24 hr tablet Take 0.5 tablets (12.5 mg total) by mouth daily. Take with or immediately following a meal. 03/15/24   Newlin, Enobong, MD  oxyCODONE -acetaminophen  (PERCOCET) 5-325 MG tablet Take 1 tablet by mouth every 4 (four) hours as needed for severe pain (pain score 7-10). 03/05/24 03/05/25  Danford, Lonni SQUIBB, MD  sacubitril -valsartan  (ENTRESTO ) 49-51 MG Take 1 tablet by mouth 2 (two) times daily. 05/06/23   Newlin, Enobong, MD  vitamin B-12 (CYANOCOBALAMIN) 1000 MCG tablet Take 3,000 mcg by mouth daily.    [provider]     Objective    Physical Exam: Vitals:   05/12/24 0105 05/12/24 0234 05/12/24 0245 05/12/24 0406  BP: 102/75 97/69 105/68 101/69  Pulse: 95 95 93 96  Resp: 17 (!) 21  20  Temp: 98.7 F (37.1 C)     TempSrc: Oral     SpO2: 95% 93%  98%  Weight:      Height:        General: appears to be  stated age; alert, oriented Skin: warm, dry, no rash Head:  AT/[place] Mouth:  Oral mucosa membranes appear moist, normal dentition Neck: supple; trachea midline Heart:  RRR; did not appreciate any M/R/G Lungs: CTAB, did not appreciate any wheezes, rales, or rhonchi Abdomen: + BS; soft, ND, NT Vascular: 2+ pedal pulses b/l; 2+ radial pulses b/l Extremities: no peripheral edema, no muscle wasting   Labs on Admission: I have personally reviewed following labs and imaging studies  CBC: Recent Labs  Lab 05/11/24 2100 05/11/24 2128 05/12/24 0142  WBC 8.5  --   --   NEUTROABS 6.9  --   --   HGB 9.1* 9.5*  9.9* 9.2*  HCT 28.8* 28.0*  29.0* 27.0*  MCV 82.1  --   --   PLT 432*  --   --    Basic Metabolic Panel: Recent Labs  Lab 05/11/24 2100 05/11/24 2128 05/12/24 0140 05/12/24 0142  NA 137 137  137 134* 137  K 3.1* 3.0*  3.0* 3.3* 3.4*  CL 96* 97* 98 98  CO2 23  --  23  --   GLUCOSE 441* 469* 395* 397*  BUN 11 10 11 10   CREATININE 0.95 0.80 0.90 0.80  CALCIUM  8.6*  --  8.1*  --    GFR: Estimated Creatinine Clearance: 61.8 mL/min (by C-G formula based on SCr of 0.8 mg/dL). Liver Function Tests: Recent Labs  Lab 05/11/24 2100  AST 18  ALT 11  ALKPHOS 127*  BILITOT 0.4  PROT 7.7  ALBUMIN 2.8*   No results for input(s): LIPASE, AMYLASE in the last 168 hours. No results for input(s): AMMONIA in the last 168 hours. Coagulation Profile: No results for input(s): INR, PROTIME in the last 168 hours. Cardiac Enzymes: No results for input(s): CKTOTAL, CKMB, CKMBINDEX, TROPONINI in the last 168 hours. BNP (last 3 results) No results for input(s): PROBNP in the last 8760 hours. HbA1C: No results for input(s): HGBA1C in the last 72 hours. CBG: Recent Labs  Lab 05/11/24 2117 05/11/24 2331 05/12/24 0218 05/12/24 0323 05/12/24 0434  GLUCAP 388* 378* 392* 339* 234*   Lipid Profile: No results for input(s): CHOL, HDL, LDLCALC, TRIG,  CHOLHDL, LDLDIRECT  in the last 72 hours. Thyroid Function Tests: No results for input(s): TSH, T4TOTAL, FREET4, T3FREE, THYROIDAB in the last 72 hours. Anemia Panel: No results for input(s): VITAMINB12, FOLATE, FERRITIN, TIBC, IRON , RETICCTPCT in the last 72 hours. Urine analysis:    Component Value Date/Time   COLORURINE YELLOW 05/12/2024 0030   APPEARANCEUR CLOUDY (A) 05/12/2024 0030   LABSPEC 1.007 05/12/2024 0030   PHURINE 5.0 05/12/2024 0030   GLUCOSEU >=500 (A) 05/12/2024 0030   HGBUR MODERATE (A) 05/12/2024 0030   BILIRUBINUR NEGATIVE 05/12/2024 0030   BILIRUBINUR negative 01/14/2024 1259   BILIRUBINUR Negative 03/17/2021 1107   KETONESUR NEGATIVE 05/12/2024 0030   PROTEINUR 100 (A) 05/12/2024 0030   UROBILINOGEN 0.2 01/14/2024 1259   UROBILINOGEN 0.2 11/07/2020 1150   NITRITE NEGATIVE 05/12/2024 0030   LEUKOCYTESUR MODERATE (A) 05/12/2024 0030    Radiological Exams on Admission: CT ABDOMEN PELVIS W CONTRAST Result Date: 05/11/2024 CLINICAL DATA:  Left lower quadrant pain EXAM: CT ABDOMEN AND PELVIS WITH CONTRAST TECHNIQUE: Multidetector CT imaging of the abdomen and pelvis was performed using the standard protocol following bolus administration of intravenous contrast. RADIATION DOSE REDUCTION: This exam was performed according to the departmental dose-optimization program which includes automated exposure control, adjustment of the mA and/or kV according to patient size and/or use of iterative reconstruction technique. CONTRAST:  75mL OMNIPAQUE  IOHEXOL  350 MG/ML SOLN COMPARISON:  CT 03/02/2024 FINDINGS: Lower chest: Lung bases demonstrate interval small moderate bilateral pleural effusions. Septal thickening at the right middle lobe lingula and lung bases. Patchy ground-glass disease in the lower lobes. Hepatobiliary: No focal liver abnormality is seen. No gallstones, gallbladder wall thickening, or biliary dilatation. Pancreas: Unremarkable. No  pancreatic ductal dilatation or surrounding inflammatory changes. Spleen: Normal in size without focal abnormality. Adrenals/Urinary Tract: Adrenal glands are normal. Interval placement of right-sided ureteral stent with proximal pigtail in the right renal collecting system and distal pigtail in the bladder. Persistent mild dilatation of the right renal pelvis and marked hydroureter with diffuse urothelial thickening of renal pelvis and ureter. Gas bubbles within the dilated ureter. Gas within the urinary bladder. Urinary bladder appears distended. A Foley catheter is present in the bladder. Delayed views demonstrate striated nephrogram on the right with patchy areas of cortical hypoenhancement. Poor excretion of contrast from the right greater than left kidneys on delayed views. Stomach/Bowel: Stomach within normal limits. No dilated small bowel. No acute bowel wall thickening. Vascular/Lymphatic: Aortic atherosclerosis. No aneurysm. Circumaortic left renal vein. Multiple subcentimeter retroperitoneal nodes. Reproductive: Uterus and bilateral adnexa are unremarkable. Other: Negative for ascites. No free air. Generalized subcutaneous edema. Musculoskeletal: No acute or suspicious osseous abnormality. IMPRESSION: 1. Interval placement of right-sided ureteral stent with proximal pigtail in the right renal collecting system and distal pigtail in the bladder. Persistent mild dilatation of the right renal pelvis and marked hydroureter with diffuse urothelial thickening of the renal pelvis and ureter. Gas bubbles within the dilated ureter and urinary bladder, presumably related to recent instrumentation. Patchy hypoenhancement of the right renal cortex on delayed views suspicious for pyelonephritis. 2. Prominent bladder distension with Foley catheter in place, correlate for Foley function. 3. Interval small to moderate bilateral pleural effusions with septal thickening and patchy ground-glass disease in the lower lobes,  findings could be secondary to pulmonary edema or infection. Generalized subcutaneous edema consistent with anasarca. 4. Aortic atherosclerosis. Aortic Atherosclerosis (ICD10-I70.0). Electronically Signed   By: Luke Bun M.D.   On: 05/11/2024 23:30   DG Chest Portable 1 View Result Date: 05/11/2024 CLINICAL  DATA:  hf sob EXAM: PORTABLE CHEST 1 VIEW COMPARISON:  Chest x-ray 03/02/2024 FINDINGS: The heart and mediastinal contours are within normal limits. No focal consolidation. No pulmonary edema. Interval development of bilateral small pleural effusions. No pneumothorax. No acute osseous abnormality. IMPRESSION: Interval development of bilateral small pleural effusions. Electronically Signed   By: Morgane  Naveau M.D.   On: 05/11/2024 21:47      Assessment/Plan   Principal Problem:   DKA (diabetic ketoacidosis) (HCC) Active Problems:   Hypokalemia   Pyelonephritis of right kidney   Chronic diastolic CHF (congestive heart failure) (HCC)   Anemia of chronic disease   Hydroureter on right      #) DKA: In the context of document history for the controlled type 1 diabetes mellitus, with most recent hemoglobin A1c of 10.7% on 04/20/2024, patient presents with elevated blood sugar 441, with CMP showing evidence of anion gap metabolic acidosis, VBG that shows relative metabolic acidosis, increasing beta-hydroxybutyrate acid.  Started on Endo tool with insulin  drip.  Suspect contribution from physiologic stress from right-sided pyelonephritis superimposed on poorly controlled baseline diabetes mellitus.  Presenting potassium level is also noted to be low, she is status post recent 8 of 8 mEq of potassium chloride , am overall prior additional potassium chloride  supplementation given ongoing insulin  drip, as below.  Plan: Insulin  drip via Endo tool.  Lactated Ringer 's at 125 cc/h, with plan to transition to D5 LR once CBG is less than 250.  Every hour CBG monitoring.  Every 4 hours BMPs.  Every 4  hours beta-hydroxybutyrate acid.  Check serum magnesium  level.  Repeat CBC in the morning.  Further evaluation management of presenting acute nephritis.  Consult placed with diabetic educator.  Potassium chloride  40 mill equivalents IV over 4 hours.  Monitor on telemetry.                  #) Pyelonephritis: The context of development of purulent urine, with greater than 50 blood cells, moderate leukocyte esterase, associated with recent worsening of right flank discomfort, CT Abdo/pelvis showing findings just above right-sided pyelonephritis, which appears to the basis of obstructive uropathy, with marked right-sided hydroureter noted, in the absence of any evidence of ureteral stone.  In the ED this evening, recently placed right ureteral stent was removed as it was attached to Foley catheter, that was subsequently exchanged in the context of occlusion of previous Foley catheter, as above.   EDP d/w on-call urology fellow working with Dr. Selma, who recommends admission to [facility], where urology will formally consult, with anticipation of taking the patient to the OR for replacement of right ureteral stent.  Urology requested the patient be kept NPO.  Additionally, urology requests that if patient spikes a fever, at IV vancomycin be added to existing cefepime.  Of note, in the absence of objective fever in the absence of leukocytosis, his return here for sepsis are not currently met.  Cultures x 2 collected, and patient was started on cefepime.  Plan: Will admit to [facility], per request of urology.  Urology formally consult, as above.  N.p.o., as above.  IV fluids, as above.  Follow-up results of blood cultures x 2.  Continue cefepime.  Repeat CBC in the morning..  Acetaminophen  for fever.  Monitor on telemetry.                   #) Chronic diastolic heart failure: Documented history of such, with most recent echo in September 2023 notable for LVEF  55 to 60% with  grade 1 diastolic dysfunction, normal right ventricular systolic function.  Presentation may be associated with an element of acute volume overload, in the context of the patient's recent discontinuation of spironolactone  and Lasix , further compounded by buildup of fluid via obstructive uropathy in setting of marked hydroureter as well as ensuing development of obstruction of Foley catheter, with urine output of 1.5 L following Foley catheter change this evening.  We will present chest x-ray shows no evidence of interstitial or pulmonary edema, BNP is noted to show interval elevation.  Currently receiving IV fluids per DKA protocol.  Will closely monitor considering volume status, including urine output now Foley catheter has been exchanged with good ensuing urine output.    Plan: Repeat BMP in the morning.  Monitor strict I's and O's and daily weights.  Monitor on telemetry.                  #) Anemia of chronic disease: Documented history of such, a/w with baseline hgb range 9-10, with presenting hgb consistent with this range, in the absence of any overt evidence of active bleed.     Plan: Repeat CBC in the morning.  Check INR.       DVT prophylaxis: SCD's   Code Status: Full code Family Communication: none Disposition Plan: Per Rounding Team Consults called: EDP d/w on-call urology fellow working with Dr. Selma, who recommends admission to [facility], where urology will formally consult, with anticipation of taking the patient to the OR for replacement of right ureteral stent.  Urology requested the patient be kept NPO.  Additionally, urology requests that if patient spikes a fever, at IV vancomycin be added to existing cefepime.;  Admission status: obs to [facility], as above.     I SPENT GREATER THAN 75  MINUTES IN CLINICAL CARE TIME/MEDICAL DECISION-MAKING IN COMPLETING THIS ADMISSION.      Eva NOVAK Makari Sanko DO [facility]  From 7PM - 7AM   05/12/2024, 5:05 AM

## 2024-05-12 NOTE — Consult Note (Addendum)
 Urology Consult   Physician requesting consult: Dr. 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 with history of T1DM, CHF who presented to hospital after several days of her catheter not draining and with worsening suprapubic pain.   Briefly, her urologic history:  02/2024 - Initially found to have distended bladder with purulent debris intraoperatively in 02/2024. Foley catheter was placed.  CT with R distal hydroureteronephrosis, new from CT in 2022.  02/2024 - R stent placement with RPG showing severe R hydroureteronephrosis with tortuous ureter with complete 360 degree loop in the proximal right ureter.  03/23/24 - UDS max capacity of approx. 351 mls. Her 1st sensation was felt at 304 mls. There was positive instability. One unstable detrusor contraction noted. She felt an increased urgency and leaked. Some artifact interfered with this part of study. She was able to generate a voluntary contraction and void 134 ml with max flow of 5 ml/s. max detrusor pressure was 2 cmH20. EMG leads were basically quiet during the voiding phase. PVR was approx. 216 ml. Trabeculation was noted. Right reflux was seen.  05/01/2024 - R ureteroscopy with no stricture or clear intrinsic source of obstruction. Biopsy of distal ureter with +yeast. Stent replaced with Foley.   Since then, she endorses that the output from Foley has been sluggish and she has had some suprapubic pain. Over the last couple days, output altogether stopped from catheter with severe abdominal pain prompting visit to ED. Also endorses persistent nausea and some low grade fevers.  On evaluation to ED, patient was AF and HDS. Labs with normal Cr, no WBC. CTAP was obtained that demonstrated R stent in place with persistent distal hydroureteronephrosis as well as a very distended bladder. In setting of non-functioning  Foley, it was replaced (and stent thus removed) with >1L purulent output.   On my evaluation, Foley is draining slightly purulent urine. Patient is AF and HDS. In ICU for treatment of DKA.   Past Medical History:  Diagnosis Date   Abdominal pain, left lower quadrant 12/17/2015   Anemia    Anxiety 07/01/2016   Carotid artery disease 10/27/2022   Chest pain, unspecified 10/05/2003   CHF (congestive heart failure) (HCC)    Chronic systolic heart failure (HCC) 02/20/2022   Depression, major, recurrent, moderate (HCC) 07/31/2005   Diabetes mellitus without complication (HCC) 09/08/2006   Endometriosis 03/28/1996   GERD (gastroesophageal reflux disease)    Heart murmur    History of colonic polyps 08/07/2015   20 mm removed 05/10/15 by Dr. Kristie     PAD (peripheral artery disease)    Unspecified constipation 06/24/2004    Past Surgical History:  Procedure Laterality Date   ABDOMINAL AORTOGRAM W/LOWER EXTREMITY Left 07/29/2023   Procedure: ABDOMINAL AORTOGRAM W/LOWER EXTREMITY;  Surgeon: Gretta Lonni PARAS, MD;  Location: [facility] INVASIVE CV LAB;  Service: Cardiovascular;  Laterality: Left;   APPENDECTOMY  08/10/1994   Dr. Buford   BREAST SURGERY Bilateral 08/10/1994   Dr. Ardeen   calcifications removed   CYSTOSCOPY W/ RETROGRADES Right 05/01/2024   Procedure: CYSTOSCOPY, WITH RETROGRADE PYELOGRAM;  Surgeon: Selma Donnice SAUNDERS, MD;  Location: [facility];  Service: Urology;  Laterality: Right;   CYSTOSCOPY W/ URETERAL STENT PLACEMENT Right 03/02/2024   Procedure: CYSTOSCOPY, WITH RETROGRADE PYELOGRAM AND URETERAL STENT INSERTION;  Surgeon: Selma Donnice SAUNDERS, MD;  Location: [facility] OR;  Service: Urology;  Laterality: Right;  CYSTOSCOPY WITH BIOPSY N/A 05/01/2024   Procedure: CYSTOSCOPY, WITH URETERAL BIOPSY;  Surgeon: Selma Donnice SAUNDERS, MD;  Location: [facility];  Service: Urology;  Laterality: N/A;   CYSTOSCOPY/URETEROSCOPY/HOLMIUM LASER/STENT PLACEMENT Right 05/01/2024   Procedure:  CYSTOSCOPY/URETEROSCOPY/EXCHANGE;  Surgeon: Selma Donnice SAUNDERS, MD;  Location: [facility];  Service: Urology;  Laterality: Right;   ESOPHAGEAL MANOMETRY N/A 03/23/2023   Procedure: ESOPHAGEAL MANOMETRY (EM);  Surgeon: Leigh Elspeth SQUIBB, MD;  Location: [facility];  Service: Gastroenterology;  Laterality: N/A;   INGUINAL HERNIA REPAIR     OVARIAN CYST REMOVAL     PERIPHERAL VASCULAR BALLOON ANGIOPLASTY Left 07/29/2023   Procedure: PERIPHERAL VASCULAR BALLOON ANGIOPLASTY;  Surgeon: Gretta Lonni PARAS, MD;  Location: [facility] INVASIVE CV LAB;  Service: Cardiovascular;  Laterality: Left;   RIGHT/LEFT HEART CATH AND CORONARY ANGIOGRAPHY N/A 10/09/2021   Procedure: RIGHT/LEFT HEART CATH AND CORONARY ANGIOGRAPHY;  Surgeon: Wendel Lurena POUR, MD;  Location: [facility] INVASIVE CV LAB;  Service: Cardiovascular;  Laterality: N/A;   TOOTH EXTRACTION N/A 07/28/2022   Procedure: EXTRACTION ALL REMAING TEETH THREE, FOUR, FIVE, SIX, SEVEN, EIGHT, NINE, TEN, ELEVEN, TWELVE, TWENTY ONE, TWENTY TWO, TWENTY THREE, TWENTY FOUR, TWENTY FIVE, TWENTY SIX, TWENTY SEVEN, TWENTY EIGHT, TWENTY NINE, THIRTY, AVEOLOPLASTY;  Surgeon: Sheryle Hamilton, DMD;  Location: [facility] OR;  Service: Oral Surgery;  Laterality: N/A;     Current Hospital Medications:  Home meds:  No current facility-administered medications on file prior to encounter.   Current Outpatient Medications on File Prior to Encounter  Medication Sig Dispense Refill   acetaminophen  (TYLENOL ) 500 MG tablet Take 1,000 mg by mouth every 6 (six) hours as needed for headache or fever (pain).     aspirin  81 MG chewable tablet Chew 1 tablet (81 mg total) by mouth daily. (Patient taking differently: Chew 81 mg by mouth at bedtime.) 30 tablet 1   atorvastatin  (LIPITOR ) 80 MG tablet Take 1 tablet (80 mg total) by mouth daily. (Patient taking differently: Take 80 mg by mouth at bedtime.) 90 tablet 1   clopidogrel  (PLAVIX ) 75 MG tablet Take 1 tablet (75 mg total) by mouth daily. 90 tablet 1    Continuous Glucose Receiver (DEXCOM G7 RECEIVER) DEVI Apply as directed. 3 each 3   fluconazole  (DIFLUCAN ) 200 MG tablet Take 1 tablet (200 mg total) by mouth daily. 14 tablet 0   Glucose Blood (BLOOD GLUCOSE TEST STRIPS) STRP 1 each by Does not apply route 3 (three) times daily. Use as directed to check blood sugar. May dispense any manufacturer covered by patient's insurance and fits patient's device. 100 strip 0   insulin  aspart (NOVOLOG ) 100 UNIT/ML FlexPen Inject 3-14 Units into the skin 3 (three) times daily with meals. If eating and Blood Glucose (BG) 80 or higher inject 3 units for meal coverage and add correction dose per scale. If not eating, correction dose only. BG <150= 0 unit; BG 150-200= 1 unit; BG 201-250= 3 unit; BG 251-300= 5 unit; BG 301-350= 7 unit; BG 351-400= 9 unit; BG >400= 11 unit and Call Primary Care. 15 mL 0   insulin  glargine (LANTUS ) 100 UNIT/ML Solostar Pen Inject 20 Units into the skin daily. (Patient taking differently: Inject 18-20 Units into the skin See admin instructions. Inject 18u three times daily with meals and 20u at bedtime.) 15 mL 3   Insulin  Pen Needle (PEN NEEDLES) 31G X 5 MM MISC Use 3 times a day. 100 each 0   Iron , Ferrous Sulfate , 325 (65 Fe) MG TABS Take 325 mg by mouth  daily. 30 tablet 1   Lancet Device MISC 1 each by Does not apply route 3 (three) times daily. May dispense any manufacturer covered by patient's insurance. 1 each 0   metoprolol  succinate (TOPROL -XL) 25 MG 24 hr tablet Take 0.5 tablets (12.5 mg total) by mouth daily. Take with or immediately following a meal.     oxyCODONE -acetaminophen  (PERCOCET) 5-325 MG tablet Take 1 tablet by mouth every 4 (four) hours as needed for severe pain (pain score 7-10). 8 tablet 0   sacubitril -valsartan  (ENTRESTO ) 49-51 MG Take 1 tablet by mouth 2 (two) times daily. 180 tablet 3   vitamin B-12 (CYANOCOBALAMIN) 1000 MCG tablet Take 3,000 mcg by mouth daily.       Scheduled Meds:  Chlorhexidine  Gluconate  Cloth  6 each Topical Daily   furosemide   40 mg Intravenous Once   Continuous Infusions:  ceFEPime (MAXIPIME) IV     dextrose  5% lactated ringers  125 mL/hr at 05/12/24 0622   insulin  1.6 Units/hr (05/12/24 0622)   potassium chloride  100 mL/hr at 05/12/24 0622   PRN Meds:.acetaminophen  **OR** acetaminophen , dextrose , ondansetron  (ZOFRAN ) IV  Allergies:  Allergies  Allergen Reactions   Glucophage  [Metformin ] Diarrhea, Nausea Only and Other (See Comments)    General GI intolerance    Bayer Aspirin  [Aspirin ] Other (See Comments)    Stomach pain occurs with 325mg  dose, able to tolerate 81mg .   Darvocet [Propoxyphene N-Acetaminophen ] Rash   Darvon-N [Propoxyphene] Rash   Ery-Tab [Erythromycin] Nausea And Vomiting   Motrin [Ibuprofen] Other (See Comments)    Stomach pain   Neurontin [Gabapentin] Other (See Comments)    Suicidal ideations     Family History  Problem Relation Age of Onset   Diabetes Mother    Hypertension Mother    Arthritis Mother        RA   Pancreatic cancer Father    Stomach cancer Neg Hx    Colon cancer Neg Hx    Esophageal cancer Neg Hx    Rectal cancer Neg Hx     Social History:  reports that she has never smoked. She has never used smokeless tobacco. She reports that she does not drink alcohol and does not use drugs.  ROS: A complete review of systems was performed.  All systems are negative except for pertinent findings as noted.  Physical Exam:  Vital signs in last 24 hours: Temp:  [98.5 F (36.9 C)-98.9 F (37.2 C)] 98.9 F (37.2 C) (10/03 0524) Pulse Rate:  [87-100] 93 (10/03 0602) Resp:  [15-23] 22 (10/03 0602) BP: (91-131)/(59-93) 98/59 (10/03 0602) SpO2:  [89 %-100 %] 93 % (10/03 0602) Weight:  [53 kg] 53 kg (10/02 2055) Constitutional:  Alert and oriented, No acute distress Cardiovascular: Regular rate  Respiratory: Normal respiratory effort, Lungs clear bilaterally GI: Abdomen is soft, mildly tender in suprapubic region,  nondistended, no abdominal masses GU:  Foley in place draining  Neurologic: Grossly intact, no focal deficits Psychiatric: Normal mood and affect  Laboratory Data:  Recent Labs    05/11/24 2100 05/11/24 2128 05/12/24 0142 05/12/24 0607  WBC 8.5  --   --  9.2  HGB 9.1* 9.5*  9.9* 9.2* 7.9*  HCT 28.8* 28.0*  29.0* 27.0* 26.5*  PLT 432*  --   --  358    Recent Labs    05/11/24 2100 05/11/24 2128 05/12/24 0140 05/12/24 0142  NA 137 137  137 134* 137  K 3.1* 3.0*  3.0* 3.3* 3.4*  CL 96* 97* 98 98  GLUCOSE 441* 469* 395* 397*  BUN 11 10 11 10   CALCIUM  8.6*  --  8.1*  --   CREATININE 0.95 0.80 0.90 0.80     Results for orders placed or performed during the hospital encounter of 05/11/24 (from the past 24 hours)  Comprehensive metabolic panel     Status: Abnormal   Collection Time: 05/11/24  9:00 PM  Result Value Ref Range   Sodium 137 135 - 145 mmol/L   Potassium 3.1 (L) 3.5 - 5.1 mmol/L   Chloride 96 (L) 98 - 111 mmol/L   CO2 23 22 - 32 mmol/L   Glucose, Bld 441 (H) 70 - 99 mg/dL   BUN 11 8 - 23 mg/dL   Creatinine, Ser 9.04 0.44 - 1.00 mg/dL   Calcium  8.6 (L) 8.9 - 10.3 mg/dL   Total Protein 7.7 6.5 - 8.1 g/dL   Albumin 2.8 (L) 3.5 - 5.0 g/dL   AST 18 15 - 41 U/L   ALT 11 0 - 44 U/L   Alkaline Phosphatase 127 (H) 38 - 126 U/L   Total Bilirubin 0.4 0.0 - 1.2 mg/dL   GFR, Estimated >39 >39 mL/min   Anion gap 18 (H) 5 - 15  CBC with Differential     Status: Abnormal   Collection Time: 05/11/24  9:00 PM  Result Value Ref Range   WBC 8.5 4.0 - 10.5 K/uL   RBC 3.51 (L) 3.87 - 5.11 MIL/uL   Hemoglobin 9.1 (L) 12.0 - 15.0 g/dL   HCT 71.1 (L) 63.9 - 53.9 %   MCV 82.1 80.0 - 100.0 fL   MCH 25.9 (L) 26.0 - 34.0 pg   MCHC 31.6 30.0 - 36.0 g/dL   RDW 84.1 (H) 88.4 - 84.4 %   Platelets 432 (H) 150 - 400 K/uL   nRBC 0.0 0.0 - 0.2 %   Neutrophils Relative % 82 %   Neutro Abs 6.9 1.7 - 7.7 K/uL   Lymphocytes Relative 13 %   Lymphs Abs 1.1 0.7 - 4.0 K/uL   Monocytes  Relative 5 %   Monocytes Absolute 0.4 0.1 - 1.0 K/uL   Eosinophils Relative 0 %   Eosinophils Absolute 0.0 0.0 - 0.5 K/uL   Basophils Relative 0 %   Basophils Absolute 0.0 0.0 - 0.1 K/uL   Immature Granulocytes 0 %   Abs Immature Granulocytes 0.03 0.00 - 0.07 K/uL  Beta-hydroxybutyric acid     Status: None   Collection Time: 05/11/24  9:00 PM  Result Value Ref Range   Beta-Hydroxybutyric Acid 0.22 0.05 - 0.27 mmol/L  Brain natriuretic peptide     Status: Abnormal   Collection Time: 05/11/24  9:00 PM  Result Value Ref Range   B Natriuretic Peptide 2,255.1 (H) 0.0 - 100.0 pg/mL  Troponin I (High Sensitivity)     Status: Abnormal   Collection Time: 05/11/24  9:00 PM  Result Value Ref Range   Troponin I (High Sensitivity) 25 (H) <18 ng/L  Resp panel by RT-PCR (RSV, Flu A&B, Covid) Anterior Nasal Swab     Status: None   Collection Time: 05/11/24  9:15 PM   Specimen: Anterior Nasal Swab  Result Value Ref Range   SARS Coronavirus 2 by RT PCR NEGATIVE NEGATIVE   Influenza A by PCR NEGATIVE NEGATIVE   Influenza B by PCR NEGATIVE NEGATIVE   Resp Syncytial Virus by PCR NEGATIVE NEGATIVE  CBG monitoring, ED     Status: Abnormal   Collection Time: 05/11/24  9:17  PM  Result Value Ref Range   Glucose-Capillary 388 (H) 70 - 99 mg/dL  I-stat chem 8, ED     Status: Abnormal   Collection Time: 05/11/24  9:28 PM  Result Value Ref Range   Sodium 137 135 - 145 mmol/L   Potassium 3.0 (L) 3.5 - 5.1 mmol/L   Chloride 97 (L) 98 - 111 mmol/L   BUN 10 8 - 23 mg/dL   Creatinine, Ser 9.19 0.44 - 1.00 mg/dL   Glucose, Bld 530 (H) 70 - 99 mg/dL   Calcium , Ion 1.07 (L) 1.15 - 1.40 mmol/L   TCO2 24 22 - 32 mmol/L   Hemoglobin 9.9 (L) 12.0 - 15.0 g/dL   HCT 70.9 (L) 63.9 - 53.9 %  I-Stat venous blood gas, ED     Status: Abnormal   Collection Time: 05/11/24  9:28 PM  Result Value Ref Range   pH, Ven 7.424 7.25 - 7.43   pCO2, Ven 40.2 (L) 44 - 60 mmHg   pO2, Ven 41 32 - 45 mmHg   Bicarbonate 26.3 20.0  - 28.0 mmol/L   TCO2 28 22 - 32 mmol/L   O2 Saturation 77 %   Acid-Base Excess 2.0 0.0 - 2.0 mmol/L   Sodium 137 135 - 145 mmol/L   Potassium 3.0 (L) 3.5 - 5.1 mmol/L   Calcium , Ion 1.07 (L) 1.15 - 1.40 mmol/L   HCT 28.0 (L) 36.0 - 46.0 %   Hemoglobin 9.5 (L) 12.0 - 15.0 g/dL   Sample type VENOUS   Troponin I (High Sensitivity)     Status: Abnormal   Collection Time: 05/11/24 11:30 PM  Result Value Ref Range   Troponin I (High Sensitivity) 31 (H) <18 ng/L  CBG monitoring, ED     Status: Abnormal   Collection Time: 05/11/24 11:31 PM  Result Value Ref Range   Glucose-Capillary 378 (H) 70 - 99 mg/dL  Urinalysis, w/ Reflex to Culture (Infection Suspected) -Urine, Clean Catch     Status: Abnormal   Collection Time: 05/12/24 12:30 AM  Result Value Ref Range   Specimen Source URINE, CLEAN CATCH    Color, Urine YELLOW YELLOW   APPearance CLOUDY (A) CLEAR   Specific Gravity, Urine 1.007 1.005 - 1.030   pH 5.0 5.0 - 8.0   Glucose, UA >=500 (A) NEGATIVE mg/dL   Hgb urine dipstick MODERATE (A) NEGATIVE   Bilirubin Urine NEGATIVE NEGATIVE   Ketones, ur NEGATIVE NEGATIVE mg/dL   Protein, ur 899 (A) NEGATIVE mg/dL   Nitrite NEGATIVE NEGATIVE   Leukocytes,Ua MODERATE (A) NEGATIVE   RBC / HPF 6-10 0 - 5 RBC/hpf   WBC, UA >50 0 - 5 WBC/hpf   Bacteria, UA RARE (A) NONE SEEN   Squamous Epithelial / HPF 6-10 0 - 5 /HPF   WBC Clumps PRESENT    Mucus PRESENT    Budding Yeast PRESENT   Basic metabolic panel     Status: Abnormal   Collection Time: 05/12/24  1:40 AM  Result Value Ref Range   Sodium 134 (L) 135 - 145 mmol/L   Potassium 3.3 (L) 3.5 - 5.1 mmol/L   Chloride 98 98 - 111 mmol/L   CO2 23 22 - 32 mmol/L   Glucose, Bld 395 (H) 70 - 99 mg/dL   BUN 11 8 - 23 mg/dL   Creatinine, Ser 9.09 0.44 - 1.00 mg/dL   Calcium  8.1 (L) 8.9 - 10.3 mg/dL   GFR, Estimated >39 >39 mL/min   Anion gap 13  5 - 15  Beta-hydroxybutyric acid     Status: None   Collection Time: 05/12/24  1:40 AM  Result  Value Ref Range   Beta-Hydroxybutyric Acid 0.26 0.05 - 0.27 mmol/L  I-stat chem 8, ED (not at [facility], [facility] or [facility])     Status: Abnormal   Collection Time: 05/12/24  1:42 AM  Result Value Ref Range   Sodium 137 135 - 145 mmol/L   Potassium 3.4 (L) 3.5 - 5.1 mmol/L   Chloride 98 98 - 111 mmol/L   BUN 10 8 - 23 mg/dL   Creatinine, Ser 9.19 0.44 - 1.00 mg/dL   Glucose, Bld 602 (H) 70 - 99 mg/dL   Calcium , Ion 1.07 (L) 1.15 - 1.40 mmol/L   TCO2 22 22 - 32 mmol/L   Hemoglobin 9.2 (L) 12.0 - 15.0 g/dL   HCT 72.9 (L) 63.9 - 53.9 %  CBG monitoring, ED     Status: Abnormal   Collection Time: 05/12/24  2:18 AM  Result Value Ref Range   Glucose-Capillary 392 (H) 70 - 99 mg/dL  CBG monitoring, ED     Status: Abnormal   Collection Time: 05/12/24  3:23 AM  Result Value Ref Range   Glucose-Capillary 339 (H) 70 - 99 mg/dL  CBG monitoring, ED     Status: Abnormal   Collection Time: 05/12/24  4:34 AM  Result Value Ref Range   Glucose-Capillary 234 (H) 70 - 99 mg/dL  Glucose, capillary     Status: Abnormal   Collection Time: 05/12/24  5:36 AM  Result Value Ref Range   Glucose-Capillary 169 (H) 70 - 99 mg/dL  CBC with Differential/Platelet     Status: Abnormal   Collection Time: 05/12/24  6:07 AM  Result Value Ref Range   WBC 9.2 4.0 - 10.5 K/uL   RBC 3.19 (L) 3.87 - 5.11 MIL/uL   Hemoglobin 7.9 (L) 12.0 - 15.0 g/dL   HCT 73.4 (L) 63.9 - 53.9 %   MCV 83.1 80.0 - 100.0 fL   MCH 24.8 (L) 26.0 - 34.0 pg   MCHC 29.8 (L) 30.0 - 36.0 g/dL   RDW 83.9 (H) 88.4 - 84.4 %   Platelets 358 150 - 400 K/uL   nRBC 0.0 0.0 - 0.2 %   Neutrophils Relative % 75 %   Neutro Abs 6.8 1.7 - 7.7 K/uL   Lymphocytes Relative 19 %   Lymphs Abs 1.8 0.7 - 4.0 K/uL   Monocytes Relative 6 %   Monocytes Absolute 0.5 0.1 - 1.0 K/uL   Eosinophils Relative 0 %   Eosinophils Absolute 0.0 0.0 - 0.5 K/uL   Basophils Relative 0 %   Basophils Absolute 0.0 0.0 - 0.1 K/uL   Immature Granulocytes 0 %   Abs Immature Granulocytes  0.03 0.00 - 0.07 K/uL   Recent Results (from the past 240 hours)  Resp panel by RT-PCR (RSV, Flu A&B, Covid) Anterior Nasal Swab     Status: None   Collection Time: 05/11/24  9:15 PM   Specimen: Anterior Nasal Swab  Result Value Ref Range Status   SARS Coronavirus 2 by RT PCR NEGATIVE NEGATIVE Final   Influenza A by PCR NEGATIVE NEGATIVE Final   Influenza B by PCR NEGATIVE NEGATIVE Final    Comment: (NOTE) The Xpert Xpress SARS-CoV-2/FLU/RSV plus assay is intended as an aid in the diagnosis of influenza from Nasopharyngeal swab specimens and should not be used as a sole basis for treatment. Nasal washings and aspirates are unacceptable  for Xpert Xpress SARS-CoV-2/FLU/RSV testing.  Fact Sheet for Patients: BloggerCourse.com  Fact Sheet for Healthcare Providers: SeriousBroker.it  This test is not yet approved or cleared by the [place]  FDA and has been authorized for detection and/or diagnosis of SARS-CoV-2 by FDA under an Emergency Use Authorization (EUA). This EUA will remain in effect (meaning this test can be used) for the duration of the COVID-19 declaration under Section 564(b)(1) of the Act, 21 U.S.C. section 360bbb-3(b)(1), unless the authorization is terminated or revoked.     Resp Syncytial Virus by PCR NEGATIVE NEGATIVE Final    Comment: (NOTE) Fact Sheet for Patients: BloggerCourse.com  Fact Sheet for Healthcare Providers: SeriousBroker.it  This test is not yet approved or cleared by the [place]  FDA and has been authorized for detection and/or diagnosis of SARS-CoV-2 by FDA under an Emergency Use Authorization (EUA). This EUA will remain in effect (meaning this test can be used) for the duration of the COVID-19 declaration under Section 564(b)(1) of the Act, 21 U.S.C. section 360bbb-3(b)(1), unless the authorization is terminated or revoked.  Performed  at [facility], [address], [place], [place] [address]     Renal Function: Recent Labs    05/11/24 2100 05/11/24 2128 05/12/24 0140 05/12/24 0142  CREATININE 0.95 0.80 0.90 0.80   Estimated Creatinine Clearance: 61.8 mL/min (by C-G formula based on SCr of 0.8 mg/dL).  Radiologic Imaging: CT ABDOMEN PELVIS W CONTRAST Result Date: 05/11/2024 CLINICAL DATA:  Left lower quadrant pain EXAM: CT ABDOMEN AND PELVIS WITH CONTRAST TECHNIQUE: Multidetector CT imaging of the abdomen and pelvis was performed using the standard protocol following bolus administration of intravenous contrast. RADIATION DOSE REDUCTION: This exam was performed according to the departmental dose-optimization program which includes automated exposure control, adjustment of the mA and/or kV according to patient size and/or use of iterative reconstruction technique. CONTRAST:  75mL OMNIPAQUE  IOHEXOL  350 MG/ML SOLN COMPARISON:  CT 03/02/2024 FINDINGS: Lower chest: Lung bases demonstrate interval small moderate bilateral pleural effusions. Septal thickening at the right middle lobe lingula and lung bases. Patchy ground-glass disease in the lower lobes. Hepatobiliary: No focal liver abnormality is seen. No gallstones, gallbladder wall thickening, or biliary dilatation. Pancreas: Unremarkable. No pancreatic ductal dilatation or surrounding inflammatory changes. Spleen: Normal in size without focal abnormality. Adrenals/Urinary Tract: Adrenal glands are normal. Interval placement of right-sided ureteral stent with proximal pigtail in the right renal collecting system and distal pigtail in the bladder. Persistent mild dilatation of the right renal pelvis and marked hydroureter with diffuse urothelial thickening of renal pelvis and ureter. Gas bubbles within the dilated ureter. Gas within the urinary bladder. Urinary bladder appears distended. A Foley catheter is present in the bladder. Delayed views demonstrate striated nephrogram  on the right with patchy areas of cortical hypoenhancement. Poor excretion of contrast from the right greater than left kidneys on delayed views. Stomach/Bowel: Stomach within normal limits. No dilated small bowel. No acute bowel wall thickening. Vascular/Lymphatic: Aortic atherosclerosis. No aneurysm. Circumaortic left renal vein. Multiple subcentimeter retroperitoneal nodes. Reproductive: Uterus and bilateral adnexa are unremarkable. Other: Negative for ascites. No free air. Generalized subcutaneous edema. Musculoskeletal: No acute or suspicious osseous abnormality. IMPRESSION: 1. Interval placement of right-sided ureteral stent with proximal pigtail in the right renal collecting system and distal pigtail in the bladder. Persistent mild dilatation of the right renal pelvis and marked hydroureter with diffuse urothelial thickening of the renal pelvis and ureter. Gas bubbles within the dilated ureter and urinary bladder, presumably related to recent  instrumentation. Patchy hypoenhancement of the right renal cortex on delayed views suspicious for pyelonephritis. 2. Prominent bladder distension with Foley catheter in place, correlate for Foley function. 3. Interval small to moderate bilateral pleural effusions with septal thickening and patchy ground-glass disease in the lower lobes, findings could be secondary to pulmonary edema or infection. Generalized subcutaneous edema consistent with anasarca. 4. Aortic atherosclerosis. Aortic Atherosclerosis (ICD10-I70.0). Electronically Signed   By: Luke Bun M.D.   On: 05/11/2024 23:30   DG Chest Portable 1 View Result Date: 05/11/2024 CLINICAL DATA:  hf sob EXAM: PORTABLE CHEST 1 VIEW COMPARISON:  Chest x-ray 03/02/2024 FINDINGS: The heart and mediastinal contours are within normal limits. No focal consolidation. No pulmonary edema. Interval development of bilateral small pleural effusions. No pneumothorax. No acute osseous abnormality. IMPRESSION: Interval  development of bilateral small pleural effusions. Electronically Signed   By: Morgane  Naveau M.D.   On: 05/11/2024 21:47    I independently reviewed the above imaging studies.  Impression/Recommendation: 37F with hx of T1DM who presented s/p R ureteroscopy with non draining Foley catheter found to be in retention with >1L purulent appearing urine. R stent now removed and new Foley catheter in place.   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 Has been managed with Foley catheter with plan to transition to CIC  -Continue Foley catheter -Patient has appointment scheduled next Wednesday with [facility] to teach CIC.   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 R ureteroscopy was notable for patulous ureteral orifice. Given chronicity, no indication to replace right stent at this time.  #Persistent yeast infection, recurrent UTI  Patient has had persistent yeast seen on Uas since 2023. Also has history of UTIs with various bacteria.  -Continue patient on IV fluconazole  and broad spectrum antibiotics until culture results -Consider consult to ID for recommendations of treatment long-term   Lyle LOISE Civil 05/12/2024, 6:23 AM   I have seen and examined the patient and agree with the above assessment and plan.  Continue foley catheter. If evidence of decreased drainage, upsize. No need for stenting right collecting system given widely patulous ureter on recent examination - this is most likely reflux from distended bladder. -Continue IV fluconazole  and broad spectrum antibiotics. -Consider consult to ID for recommendations of treatment long-term   Matt R. Glover Capano MD [facility]  Pager: [phone]

## 2024-05-12 NOTE — ED Notes (Signed)
 All medications infusing via left arm IV verified compatibility using micromedex.

## 2024-05-12 NOTE — Consult Note (Signed)
 [facility]    Date of Admission:  05/11/2024     Reason for Consult: question of recurrent uti    Referring Provider: Leotis Bogus     Lines:  Peripheral iv's  Abx: 10/2-c ceftriaxone   10/3 cefepime 10/3 fluconazole   9/30-10/2 outpatient fluconazole  04-23-09/2 outpatient cephalexin         Assessment: 63 yo female poorly controlled dm1, HFpEF, admitted 10/2 presumed dka and imaging suggestion of right pyelo, but really due to acute dyspnea  Patient has had foley partial obstruction and lower abd pain and dysuria. Urology following. Changed foley this admission.   Bhb level normal on admission; glucose was high in 400s that is improved/controlled with insulin  protocol/supportive care  Abd pelv ct suggest imaging changes of right pyelo and small-moderate bilateral pleural effusion; query diastolic acute heart failure  Admission bcx negative Mrsa pcr nares cx negative  Previous cx kleb pna 02/2024 (no cefazolin  testing (S amp-sulb, cipro , Bactrim ); 01/2024 ucx kleb pna does show cefazolin  sensitivity for urine only (not blood if bacteremic)   There is no aki   Of note, there is report of her having yeast on urine... this is a common colonizer especially in diabetic and chronic foley catheter. We do not need to treat yeast in the urine by itself; and we definitely will see it again and again despite any antifungal amount   Plan: Keep iv abx for today, and tomorrow would finish treatment with bactrim  ds 2 tablet po bid for 10 days starting 10/2 to 10/11 Recommend checking a chemistry 3-5 days into bactrim  course if she is still here to monitor potassium/creatinine Maintain standard isolation precaution Heart failure evaluation/management and diabetic management per primary team If foley is no longer needed for I&O monitoring, please remove given prior hx of candida colonization and uti There is no long term evidence to support uti  prophylaxis but reasonable to start non-pharmacologic methenamine/vitamin c on discharge Id will sign off Discussed with [facility] and urology     ------------------------------------------------ Principal Problem:   DKA (diabetic ketoacidosis) (HCC) Active Problems:   Hypokalemia   Pyelonephritis of right kidney   Chronic diastolic CHF (congestive heart failure) (HCC)   Anemia of chronic disease   Hydroureter on right    HPI: Brooke [name] is a 63 y.o. female poorly controlled dm1, HFpEF, admitted 10/2 presumed dka and imaging suggestion of right pyelo, but really due to acute dyspnea   She had dyspnea and leg swelling for 2-3 days She has lower abd spasm and dysuria as well for several days in setting poor foley output She drinks rather quite a few bottles of water  at home but reports her sugar does run high  No subjective f/c  She has had foley for several weeks now in setting hx stone and ?bladder atony. Followed by urology   On admission: No leukocytosis, fever Plasma glucose high 400s but normal bhb level Urology seen and exchange foley -- noticed ?purulence vs sediment No ucx yet Imaging suggestive of right pyelo Bs Abx including a dose fluconazole   No complaint today  Bp soft 90s    Family History  Problem Relation Age of Onset   Diabetes Mother    Hypertension Mother    Arthritis Mother        RA   Pancreatic cancer Father    Stomach cancer Neg Hx    Colon cancer Neg Hx    Esophageal cancer Neg Hx  Rectal cancer Neg Hx     Social History   Tobacco Use   Smoking status: Never   Smokeless tobacco: Never  Vaping Use   Vaping status: Never Used  Substance Use Topics   Alcohol use: No   Drug use: No    Allergies  Allergen Reactions   Bayer Aspirin  [Aspirin ] Other (See Comments)    Stomach pain occurs with 325mg  dose, able to tolerate 81mg .   Darvon-N [Propoxyphene] Rash and Other (See Comments)    Darvocet    Ery-Tab [Erythromycin] Nausea  And Vomiting   Glucophage  [Metformin ] Diarrhea, Nausea Only and Other (See Comments)    General GI intolerance    Motrin [Ibuprofen] Other (See Comments)    Stomach pain   Neurontin [Gabapentin] Other (See Comments)    Suicidal ideations     Review of Systems: ROS All Other ROS was negative, except mentioned above   Past Medical History:  Diagnosis Date   Abdominal pain, left lower quadrant 12/17/2015   Anemia    Anxiety 07/01/2016   Carotid artery disease 10/27/2022   Chest pain, unspecified 10/05/2003   CHF (congestive heart failure) (HCC)    Chronic systolic heart failure (HCC) 02/20/2022   Depression, major, recurrent, moderate (HCC) 07/31/2005   Diabetes mellitus without complication (HCC) 09/08/2006   Endometriosis 03/28/1996   GERD (gastroesophageal reflux disease)    Heart murmur    History of colonic polyps 08/07/2015   20 mm removed 05/10/15 by Dr. Kristie     PAD (peripheral artery disease)    Unspecified constipation 06/24/2004       Scheduled Meds:  Chlorhexidine  Gluconate Cloth  6 each Topical Daily   furosemide   40 mg Intravenous Once   Continuous Infusions:  cefTRIAXone  (ROCEPHIN )  IV     dextrose  5% lactated ringers  125 mL/hr at 05/12/24 0927   insulin  1.7 Units/hr (05/12/24 0927)   potassium chloride  10 mEq (05/12/24 0927)   PRN Meds:.acetaminophen  **OR** acetaminophen , dextrose , ondansetron  (ZOFRAN ) IV, mouth rinse   OBJECTIVE: Blood pressure 105/74, pulse 93, temperature 98.4 F (36.9 C), temperature source Oral, resp. rate (!) 21, height 5' 4 (1.626 m), weight 53 kg, last menstrual period 11/16/2014, SpO2 96%.  Physical Exam  General/constitutional: no distress, pleasant HEENT: Normocephalic, PER, Conj Clear, EOMI, Oropharynx clear Neck supple CV: rrr no mrg Lungs: clear to auscultation, normal respiratory effort Abd: Soft, Nontender Ext: trace bilateral lower Ext edema Skin: No Rash Neuro: nonfocal MSK: no peripheral joint  swelling/tenderness/warmth; back spines nontender    Lab Results Lab Results  Component Value Date   WBC 9.2 05/12/2024   HGB 7.9 (L) 05/12/2024   HCT 26.5 (L) 05/12/2024   MCV 83.1 05/12/2024   PLT 358 05/12/2024    Lab Results  Component Value Date   CREATININE 0.84 05/12/2024   BUN 12 05/12/2024   NA 140 05/12/2024   K 3.5 05/12/2024   CL 103 05/12/2024   CO2 21 (L) 05/12/2024    Lab Results  Component Value Date   ALT 11 05/11/2024   AST 18 05/11/2024   ALKPHOS 127 (H) 05/11/2024   BILITOT 0.4 05/11/2024      Microbiology: Recent Results (from the past 240 hours)  Resp panel by RT-PCR (RSV, Flu A&B, Covid) Anterior Nasal Swab     Status: None   Collection Time: 05/11/24  9:15 PM   Specimen: Anterior Nasal Swab  Result Value Ref Range Status   SARS Coronavirus 2 by RT PCR NEGATIVE NEGATIVE Final  Influenza A by PCR NEGATIVE NEGATIVE Final   Influenza B by PCR NEGATIVE NEGATIVE Final    Comment: (NOTE) The Xpert Xpress SARS-CoV-2/FLU/RSV plus assay is intended as an aid in the diagnosis of influenza from Nasopharyngeal swab specimens and should not be used as a sole basis for treatment. Nasal washings and aspirates are unacceptable for Xpert Xpress SARS-CoV-2/FLU/RSV testing.  Fact Sheet for Patients: BloggerCourse.com  Fact Sheet for Healthcare Providers: SeriousBroker.it  This test is not yet approved or cleared by the [place]  FDA and has been authorized for detection and/or diagnosis of SARS-CoV-2 by FDA under an Emergency Use Authorization (EUA). This EUA will remain in effect (meaning this test can be used) for the duration of the COVID-19 declaration under Section 564(b)(1) of the Act, 21 U.S.C. section 360bbb-3(b)(1), unless the authorization is terminated or revoked.     Resp Syncytial Virus by PCR NEGATIVE NEGATIVE Final    Comment: (NOTE) Fact Sheet for  Patients: BloggerCourse.com  Fact Sheet for Healthcare Providers: SeriousBroker.it  This test is not yet approved or cleared by the [place]  FDA and has been authorized for detection and/or diagnosis of SARS-CoV-2 by FDA under an Emergency Use Authorization (EUA). This EUA will remain in effect (meaning this test can be used) for the duration of the COVID-19 declaration under Section 564(b)(1) of the Act, 21 U.S.C. section 360bbb-3(b)(1), unless the authorization is terminated or revoked.  Performed at [facility], [address] [place], [place] [address]   MRSA Next Gen by PCR, Nasal     Status: None   Collection Time: 05/12/24  5:11 AM   Specimen: Nasal Mucosa; Nasal Swab  Result Value Ref Range Status   MRSA by PCR Next Gen NOT DETECTED NOT DETECTED Final    Comment: (NOTE) The GeneXpert MRSA Assay (FDA approved for NASAL specimens only), is one component of a comprehensive MRSA colonization surveillance program. It is not intended to diagnose MRSA infection nor to guide or monitor treatment for MRSA infections. Test performance is not FDA approved in patients less than 64 years old. Performed at [facility], [address] [place], [place] [address]      Serology:    Imaging: If present, new imagings (plain films, ct scans, and mri) have been personally visualized and interpreted; radiology reports have been reviewed. Decision making incorporated into the Impression / Recommendations.  10/2 abd pelv ct with contrast 1. Interval placement of right-sided ureteral stent with proximal pigtail in the right renal collecting system and distal pigtail in the bladder. Persistent mild dilatation of the right renal pelvis and marked hydroureter with diffuse urothelial thickening of the renal pelvis and ureter. Gas bubbles within the dilated ureter and urinary bladder, presumably related to  recent instrumentation. Patchy hypoenhancement of the right renal cortex on delayed views suspicious for pyelonephritis. 2. Prominent bladder distension with Foley catheter in place, correlate for Foley function. 3. Interval small to moderate bilateral pleural effusions with septal thickening and patchy ground-glass disease in the lower lobes, findings could be secondary to pulmonary edema or infection. Generalized subcutaneous edema consistent with anasarca. 4. Aortic atherosclerosis.  Constance ONEIDA Passer, MD [facility] [phone] pager    05/12/2024, 10:10 AM

## 2024-05-12 NOTE — Progress Notes (Signed)
 NCM spoke with pt spouse Beryl List via telephone at [phone] to discuss Legal Guardian paperwork. Pt spouse denies any knowledge of Legal Guardianship and does not have any paperwork stating, or acknowledging Legal Guardianship. NCM deactivated Doctor, hospital.

## 2024-05-12 NOTE — Care Plan (Signed)
 This 63 years old female with PMH significant for poorly controlled type 1 diabetes, chronic diastolic heart failure presented in the ED with shortness of breath for 2 days associated with right flank pain, fever and increasing peripheral edema.  Patient has history of poorly controlled type 1 diabetes with most recent hemoglobin A1c 10.7 on 04/20/2024.  Patient recently underwent right ureteral stent placement in the setting of hydronephrosis.  She is found to have UA positive for UTI, started on empiric antibiotics.  Labs also consistent with DKA started on insulin  drip.  Patient was admitted for DKA in the setting of pyelonephritis.  Continued on IV antibiotics and urology is consulted.  Patient was seen and examined at bedside,  reports feeling improved.

## 2024-05-12 NOTE — ED Provider Notes (Signed)
 [place] [facility] AT [facility] Provider Note   CSN: [id number] Arrival date & time: 05/11/24  2047     Patient presents with: Shortness of Breath   Brooke [name] is a 63 y.o. female.   The history is provided by the patient.  Shortness of Breath Severity:  Severe Onset quality:  Gradual Timing:  Constant Progression:  Worsening Chronicity:  New Context: not activity   Relieved by:  Nothing Worsened by:  Nothing Ineffective treatments:  None tried Associated symptoms: abdominal pain   Associated symptoms: no fever   Associated symptoms comment:  No foley output  Risk factors: no hx of PE/DVT   Patient with poorly controlled DM and ureteral stenting and catheter insertion in the OR on 9/22 presents with SOB and anasarca off her diuretics.      Past Medical History:  Diagnosis Date   Abdominal pain, left lower quadrant 12/17/2015   Anemia    Anxiety 07/01/2016   Carotid artery disease 10/27/2022   Chest pain, unspecified 10/05/2003   CHF (congestive heart failure) (HCC)    Chronic systolic heart failure (HCC) 02/20/2022   Depression, major, recurrent, moderate (HCC) 07/31/2005   Diabetes mellitus without complication (HCC) 09/08/2006   Endometriosis 03/28/1996   GERD (gastroesophageal reflux disease)    Heart murmur    History of colonic polyps 08/07/2015   20 mm removed 05/10/15 by Dr. Kristie     PAD (peripheral artery disease)    Unspecified constipation 06/24/2004     Prior to Admission medications   Medication Sig Start Date End Date Taking? Authorizing Provider  acetaminophen  (TYLENOL ) 500 MG tablet Take 1,000 mg by mouth every 6 (six) hours as needed for headache or fever (pain).    [provider]  aspirin  81 MG chewable tablet Chew 1 tablet (81 mg total) by mouth daily. Patient taking differently: Chew 81 mg by mouth at bedtime. 12/15/21   Bensimhon, Toribio SAUNDERS, MD  atorvastatin  (LIPITOR ) 80 MG tablet Take 1 tablet (80 mg  total) by mouth daily. Patient taking differently: Take 80 mg by mouth at bedtime. 03/15/24   Newlin, Enobong, MD  cephALEXin  (KEFLEX ) 500 MG capsule Take 1 capsule (500 mg total) by mouth 2 (two) times daily. Take this prescription starting on 04/24/2024 Patient not taking: Reported on 04/20/2024 04/19/24     clopidogrel  (PLAVIX ) 75 MG tablet Take 1 tablet (75 mg total) by mouth daily. 03/13/24   Newlin, Enobong, MD  Continuous Glucose Receiver (DEXCOM G7 RECEIVER) DEVI Apply as directed. 03/05/24   Danford, Lonni SQUIBB, MD  Continuous Glucose Sensor (DEXCOM G7 SENSOR) MISC Use to check blood sugar 3 times daily. 12/11/22   Newlin, Enobong, MD  fluconazole  (DIFLUCAN ) 200 MG tablet Take 1 tablet (200 mg total) by mouth daily. 05/09/24     Glucose Blood (BLOOD GLUCOSE TEST STRIPS) STRP 1 each by Does not apply route 3 (three) times daily. Use as directed to check blood sugar. May dispense any manufacturer covered by patient's insurance and fits patient's device. 03/05/24   Danford, Lonni SQUIBB, MD  insulin  aspart (NOVOLOG ) 100 UNIT/ML FlexPen Inject 3-14 Units into the skin 3 (three) times daily with meals. If eating and Blood Glucose (BG) 80 or higher inject 3 units for meal coverage and add correction dose per scale. If not eating, correction dose only. BG <150= 0 unit; BG 150-200= 1 unit; BG 201-250= 3 unit; BG 251-300= 5 unit; BG 301-350= 7 unit; BG 351-400= 9 unit; BG >400= 11 unit  and Call Primary Care. 03/05/24   Danford, Lonni SQUIBB, MD  insulin  glargine (LANTUS ) 100 UNIT/ML Solostar Pen Inject 20 Units into the skin daily. Patient taking differently: Inject 18-20 Units into the skin See admin instructions. Inject 18u three times daily with meals and 20u at bedtime. 03/15/24   Newlin, Enobong, MD  Insulin  Pen Needle (PEN NEEDLES) 31G X 5 MM MISC Use 3 times a day. 03/05/24   Danford, Lonni SQUIBB, MD  Iron , Ferrous Sulfate , 325 (65 Fe) MG TABS Take 325 mg by mouth daily. 03/16/24   Newlin, Enobong, MD   Lancet Device MISC 1 each by Does not apply route 3 (three) times daily. May dispense any manufacturer covered by patient's insurance. 03/05/24   Danford, Lonni SQUIBB, MD  metoprolol  succinate (TOPROL -XL) 25 MG 24 hr tablet Take 0.5 tablets (12.5 mg total) by mouth daily. Take with or immediately following a meal. 03/15/24   Newlin, Enobong, MD  oxyCODONE -acetaminophen  (PERCOCET) 5-325 MG tablet Take 1 tablet by mouth every 4 (four) hours as needed for severe pain (pain score 7-10). 03/05/24 03/05/25  Danford, Lonni SQUIBB, MD  sacubitril -valsartan  (ENTRESTO ) 49-51 MG Take 1 tablet by mouth 2 (two) times daily. 05/06/23   Newlin, Enobong, MD  spironolactone  (ALDACTONE ) 25 MG tablet Take 1 tablet (25 mg total) by mouth daily. Patient not taking: Reported on 04/20/2024 11/17/22   Newlin, Enobong, MD  vitamin B-12 (CYANOCOBALAMIN) 1000 MCG tablet Take 3,000 mcg by mouth daily.    [provider]    Allergies: Glucophage  [metformin ], Bayer aspirin  [aspirin ], Darvocet [propoxyphene n-acetaminophen ], Darvon-n [propoxyphene], Ery-tab [erythromycin], Motrin [ibuprofen], and Neurontin [gabapentin]    Review of Systems  Constitutional:  Negative for fever.  Respiratory:  Positive for shortness of breath.   Gastrointestinal:  Positive for abdominal pain.  Genitourinary:  Positive for difficulty urinating.  All other systems reviewed and are negative.   Updated Vital Signs BP 102/75   Pulse 95   Temp 98.7 F (37.1 C) (Oral)   Resp 17   Ht 5' 4 (1.626 m)   Wt 53 kg   LMP 11/16/2014 (Approximate)   SpO2 95%   BMI 20.06 kg/m   Physical Exam Vitals and nursing note reviewed. Exam conducted with a chaperone present.  Constitutional:      General: She is not in acute distress.    Appearance: Normal appearance. She is well-developed. She is ill-appearing.  HENT:     Head: Normocephalic and atraumatic.     Nose: Nose normal.  Eyes:     Pupils: Pupils are equal, round, and reactive to  light.  Cardiovascular:     Rate and Rhythm: Normal rate and regular rhythm.     Pulses: Normal pulses.     Heart sounds: Normal heart sounds.  Pulmonary:     Effort: Pulmonary effort is normal. No respiratory distress.     Breath sounds: Rhonchi and rales present.  Abdominal:     General: Bowel sounds are normal. There is no distension.     Palpations: Abdomen is soft.     Tenderness: There is no abdominal tenderness. There is no guarding or rebound.  Musculoskeletal:        General: Normal range of motion.     Cervical back: Normal range of motion and neck supple.     Right lower leg: Edema present.     Left lower leg: Edema present.  Skin:    General: Skin is warm and dry.     Capillary Refill: Capillary  refill takes less than 2 seconds.     Findings: No erythema or rash.  Neurological:     General: No focal deficit present.     Mental Status: She is alert and oriented to person, place, and time.     Deep Tendon Reflexes: Reflexes normal.  Psychiatric:        Mood and Affect: Mood normal.     (all labs ordered are listed, but only abnormal results are displayed) Results for orders placed or performed during the hospital encounter of 05/11/24  Comprehensive metabolic panel   Collection Time: 05/11/24  9:00 PM  Result Value Ref Range   Sodium 137 135 - 145 mmol/L   Potassium 3.1 (L) 3.5 - 5.1 mmol/L   Chloride 96 (L) 98 - 111 mmol/L   CO2 23 22 - 32 mmol/L   Glucose, Bld 441 (H) 70 - 99 mg/dL   BUN 11 8 - 23 mg/dL   Creatinine, Ser 9.04 0.44 - 1.00 mg/dL   Calcium  8.6 (L) 8.9 - 10.3 mg/dL   Total Protein 7.7 6.5 - 8.1 g/dL   Albumin 2.8 (L) 3.5 - 5.0 g/dL   AST 18 15 - 41 U/L   ALT 11 0 - 44 U/L   Alkaline Phosphatase 127 (H) 38 - 126 U/L   Total Bilirubin 0.4 0.0 - 1.2 mg/dL   GFR, Estimated >39 >39 mL/min   Anion gap 18 (H) 5 - 15  CBC with Differential   Collection Time: 05/11/24  9:00 PM  Result Value Ref Range   WBC 8.5 4.0 - 10.5 K/uL   RBC 3.51 (L) 3.87  - 5.11 MIL/uL   Hemoglobin 9.1 (L) 12.0 - 15.0 g/dL   HCT 71.1 (L) 63.9 - 53.9 %   MCV 82.1 80.0 - 100.0 fL   MCH 25.9 (L) 26.0 - 34.0 pg   MCHC 31.6 30.0 - 36.0 g/dL   RDW 84.1 (H) 88.4 - 84.4 %   Platelets 432 (H) 150 - 400 K/uL   nRBC 0.0 0.0 - 0.2 %   Neutrophils Relative % 82 %   Neutro Abs 6.9 1.7 - 7.7 K/uL   Lymphocytes Relative 13 %   Lymphs Abs 1.1 0.7 - 4.0 K/uL   Monocytes Relative 5 %   Monocytes Absolute 0.4 0.1 - 1.0 K/uL   Eosinophils Relative 0 %   Eosinophils Absolute 0.0 0.0 - 0.5 K/uL   Basophils Relative 0 %   Basophils Absolute 0.0 0.0 - 0.1 K/uL   Immature Granulocytes 0 %   Abs Immature Granulocytes 0.03 0.00 - 0.07 K/uL  Beta-hydroxybutyric acid   Collection Time: 05/11/24  9:00 PM  Result Value Ref Range   Beta-Hydroxybutyric Acid 0.22 0.05 - 0.27 mmol/L  Brain natriuretic peptide   Collection Time: 05/11/24  9:00 PM  Result Value Ref Range   B Natriuretic Peptide 2,255.1 (H) 0.0 - 100.0 pg/mL  Troponin I (High Sensitivity)   Collection Time: 05/11/24  9:00 PM  Result Value Ref Range   Troponin I (High Sensitivity) 25 (H) <18 ng/L  Resp panel by RT-PCR (RSV, Flu A&B, Covid) Anterior Nasal Swab   Collection Time: 05/11/24  9:15 PM   Specimen: Anterior Nasal Swab  Result Value Ref Range   SARS Coronavirus 2 by RT PCR NEGATIVE NEGATIVE   Influenza A by PCR NEGATIVE NEGATIVE   Influenza B by PCR NEGATIVE NEGATIVE   Resp Syncytial Virus by PCR NEGATIVE NEGATIVE  CBG monitoring, ED   Collection Time: 05/11/24  9:17 PM  Result Value Ref Range   Glucose-Capillary 388 (H) 70 - 99 mg/dL  I-stat chem 8, ED   Collection Time: 05/11/24  9:28 PM  Result Value Ref Range   Sodium 137 135 - 145 mmol/L   Potassium 3.0 (L) 3.5 - 5.1 mmol/L   Chloride 97 (L) 98 - 111 mmol/L   BUN 10 8 - 23 mg/dL   Creatinine, Ser 9.19 0.44 - 1.00 mg/dL   Glucose, Bld 530 (H) 70 - 99 mg/dL   Calcium , Ion 1.07 (L) 1.15 - 1.40 mmol/L   TCO2 24 22 - 32 mmol/L   Hemoglobin 9.9  (L) 12.0 - 15.0 g/dL   HCT 70.9 (L) 63.9 - 53.9 %  I-Stat venous blood gas, ED   Collection Time: 05/11/24  9:28 PM  Result Value Ref Range   pH, Ven 7.424 7.25 - 7.43   pCO2, Ven 40.2 (L) 44 - 60 mmHg   pO2, Ven 41 32 - 45 mmHg   Bicarbonate 26.3 20.0 - 28.0 mmol/L   TCO2 28 22 - 32 mmol/L   O2 Saturation 77 %   Acid-Base Excess 2.0 0.0 - 2.0 mmol/L   Sodium 137 135 - 145 mmol/L   Potassium 3.0 (L) 3.5 - 5.1 mmol/L   Calcium , Ion 1.07 (L) 1.15 - 1.40 mmol/L   HCT 28.0 (L) 36.0 - 46.0 %   Hemoglobin 9.5 (L) 12.0 - 15.0 g/dL   Sample type VENOUS   Troponin I (High Sensitivity)   Collection Time: 05/11/24 11:30 PM  Result Value Ref Range   Troponin I (High Sensitivity) 31 (H) <18 ng/L  CBG monitoring, ED   Collection Time: 05/11/24 11:31 PM  Result Value Ref Range   Glucose-Capillary 378 (H) 70 - 99 mg/dL  Urinalysis, w/ Reflex to Culture (Infection Suspected) -Urine, Clean Catch   Collection Time: 05/12/24 12:30 AM  Result Value Ref Range   Specimen Source URINE, CLEAN CATCH    Color, Urine YELLOW YELLOW   APPearance CLOUDY (A) CLEAR   Specific Gravity, Urine 1.007 1.005 - 1.030   pH 5.0 5.0 - 8.0   Glucose, UA >=500 (A) NEGATIVE mg/dL   Hgb urine dipstick MODERATE (A) NEGATIVE   Bilirubin Urine NEGATIVE NEGATIVE   Ketones, ur NEGATIVE NEGATIVE mg/dL   Protein, ur 899 (A) NEGATIVE mg/dL   Nitrite NEGATIVE NEGATIVE   Leukocytes,Ua MODERATE (A) NEGATIVE   RBC / HPF 6-10 0 - 5 RBC/hpf   WBC, UA >50 0 - 5 WBC/hpf   Bacteria, UA RARE (A) NONE SEEN   Squamous Epithelial / HPF 6-10 0 - 5 /HPF   WBC Clumps PRESENT    Mucus PRESENT    Budding Yeast PRESENT   Basic metabolic panel   Collection Time: 05/12/24  1:40 AM  Result Value Ref Range   Sodium 134 (L) 135 - 145 mmol/L   Potassium 3.3 (L) 3.5 - 5.1 mmol/L   Chloride 98 98 - 111 mmol/L   CO2 23 22 - 32 mmol/L   Glucose, Bld 395 (H) 70 - 99 mg/dL   BUN 11 8 - 23 mg/dL   Creatinine, Ser 9.09 0.44 - 1.00 mg/dL    Calcium  8.1 (L) 8.9 - 10.3 mg/dL   GFR, Estimated >39 >39 mL/min   Anion gap 13 5 - 15  Beta-hydroxybutyric acid   Collection Time: 05/12/24  1:40 AM  Result Value Ref Range   Beta-Hydroxybutyric Acid 0.26 0.05 - 0.27 mmol/L  I-stat chem 8, ED (not  at [facility], [facility] or [facility])   Collection Time: 05/12/24  1:42 AM  Result Value Ref Range   Sodium 137 135 - 145 mmol/L   Potassium 3.4 (L) 3.5 - 5.1 mmol/L   Chloride 98 98 - 111 mmol/L   BUN 10 8 - 23 mg/dL   Creatinine, Ser 9.19 0.44 - 1.00 mg/dL   Glucose, Bld 602 (H) 70 - 99 mg/dL   Calcium , Ion 1.07 (L) 1.15 - 1.40 mmol/L   TCO2 22 22 - 32 mmol/L   Hemoglobin 9.2 (L) 12.0 - 15.0 g/dL   HCT 72.9 (L) 63.9 - 53.9 %  CBG monitoring, ED   Collection Time: 05/12/24  2:18 AM  Result Value Ref Range   Glucose-Capillary 392 (H) 70 - 99 mg/dL   CT ABDOMEN PELVIS W CONTRAST Result Date: 05/11/2024 CLINICAL DATA:  Left lower quadrant pain EXAM: CT ABDOMEN AND PELVIS WITH CONTRAST TECHNIQUE: Multidetector CT imaging of the abdomen and pelvis was performed using the standard protocol following bolus administration of intravenous contrast. RADIATION DOSE REDUCTION: This exam was performed according to the departmental dose-optimization program which includes automated exposure control, adjustment of the mA and/or kV according to patient size and/or use of iterative reconstruction technique. CONTRAST:  75mL OMNIPAQUE  IOHEXOL  350 MG/ML SOLN COMPARISON:  CT 03/02/2024 FINDINGS: Lower chest: Lung bases demonstrate interval small moderate bilateral pleural effusions. Septal thickening at the right middle lobe lingula and lung bases. Patchy ground-glass disease in the lower lobes. Hepatobiliary: No focal liver abnormality is seen. No gallstones, gallbladder wall thickening, or biliary dilatation. Pancreas: Unremarkable. No pancreatic ductal dilatation or surrounding inflammatory changes. Spleen: Normal in size without focal abnormality. Adrenals/Urinary Tract: Adrenal  glands are normal. Interval placement of right-sided ureteral stent with proximal pigtail in the right renal collecting system and distal pigtail in the bladder. Persistent mild dilatation of the right renal pelvis and marked hydroureter with diffuse urothelial thickening of renal pelvis and ureter. Gas bubbles within the dilated ureter. Gas within the urinary bladder. Urinary bladder appears distended. A Foley catheter is present in the bladder. Delayed views demonstrate striated nephrogram on the right with patchy areas of cortical hypoenhancement. Poor excretion of contrast from the right greater than left kidneys on delayed views. Stomach/Bowel: Stomach within normal limits. No dilated small bowel. No acute bowel wall thickening. Vascular/Lymphatic: Aortic atherosclerosis. No aneurysm. Circumaortic left renal vein. Multiple subcentimeter retroperitoneal nodes. Reproductive: Uterus and bilateral adnexa are unremarkable. Other: Negative for ascites. No free air. Generalized subcutaneous edema. Musculoskeletal: No acute or suspicious osseous abnormality. IMPRESSION: 1. Interval placement of right-sided ureteral stent with proximal pigtail in the right renal collecting system and distal pigtail in the bladder. Persistent mild dilatation of the right renal pelvis and marked hydroureter with diffuse urothelial thickening of the renal pelvis and ureter. Gas bubbles within the dilated ureter and urinary bladder, presumably related to recent instrumentation. Patchy hypoenhancement of the right renal cortex on delayed views suspicious for pyelonephritis. 2. Prominent bladder distension with Foley catheter in place, correlate for Foley function. 3. Interval small to moderate bilateral pleural effusions with septal thickening and patchy ground-glass disease in the lower lobes, findings could be secondary to pulmonary edema or infection. Generalized subcutaneous edema consistent with anasarca. 4. Aortic atherosclerosis.  Aortic Atherosclerosis (ICD10-I70.0). Electronically Signed   By: Luke Bun M.D.   On: 05/11/2024 23:30   DG Chest Portable 1 View Result Date: 05/11/2024 CLINICAL DATA:  hf sob EXAM: PORTABLE CHEST 1 VIEW COMPARISON:  Chest x-ray 03/02/2024 FINDINGS:  The heart and mediastinal contours are within normal limits. No focal consolidation. No pulmonary edema. Interval development of bilateral small pleural effusions. No pneumothorax. No acute osseous abnormality. IMPRESSION: Interval development of bilateral small pleural effusions. Electronically Signed   By: Morgane  Naveau M.D.   On: 05/11/2024 21:47   DG C-Arm 1-60 Min-No Report Result Date: 05/01/2024 Fluoroscopy was utilized by the requesting physician.  No radiographic interpretation.    EKG: EKG Interpretation Date/Time:  Thursday May 11 2024 20:52:26 EDT Ventricular Rate:  95 PR Interval:  170 QRS Duration:  87 QT Interval:  353 QTC Calculation: 444 R Axis:   76  Text Interpretation: Sinus rhythm Confirmed by Nettie, Jaryiah Mehlman ([id number]) on 05/11/2024 11:06:14 PM  Radiology: CT ABDOMEN PELVIS W CONTRAST Result Date: 05/11/2024 CLINICAL DATA:  Left lower quadrant pain EXAM: CT ABDOMEN AND PELVIS WITH CONTRAST TECHNIQUE: Multidetector CT imaging of the abdomen and pelvis was performed using the standard protocol following bolus administration of intravenous contrast. RADIATION DOSE REDUCTION: This exam was performed according to the departmental dose-optimization program which includes automated exposure control, adjustment of the mA and/or kV according to patient size and/or use of iterative reconstruction technique. CONTRAST:  75mL OMNIPAQUE  IOHEXOL  350 MG/ML SOLN COMPARISON:  CT 03/02/2024 FINDINGS: Lower chest: Lung bases demonstrate interval small moderate bilateral pleural effusions. Septal thickening at the right middle lobe lingula and lung bases. Patchy ground-glass disease in the lower lobes. Hepatobiliary: No focal liver  abnormality is seen. No gallstones, gallbladder wall thickening, or biliary dilatation. Pancreas: Unremarkable. No pancreatic ductal dilatation or surrounding inflammatory changes. Spleen: Normal in size without focal abnormality. Adrenals/Urinary Tract: Adrenal glands are normal. Interval placement of right-sided ureteral stent with proximal pigtail in the right renal collecting system and distal pigtail in the bladder. Persistent mild dilatation of the right renal pelvis and marked hydroureter with diffuse urothelial thickening of renal pelvis and ureter. Gas bubbles within the dilated ureter. Gas within the urinary bladder. Urinary bladder appears distended. A Foley catheter is present in the bladder. Delayed views demonstrate striated nephrogram on the right with patchy areas of cortical hypoenhancement. Poor excretion of contrast from the right greater than left kidneys on delayed views. Stomach/Bowel: Stomach within normal limits. No dilated small bowel. No acute bowel wall thickening. Vascular/Lymphatic: Aortic atherosclerosis. No aneurysm. Circumaortic left renal vein. Multiple subcentimeter retroperitoneal nodes. Reproductive: Uterus and bilateral adnexa are unremarkable. Other: Negative for ascites. No free air. Generalized subcutaneous edema. Musculoskeletal: No acute or suspicious osseous abnormality. IMPRESSION: 1. Interval placement of right-sided ureteral stent with proximal pigtail in the right renal collecting system and distal pigtail in the bladder. Persistent mild dilatation of the right renal pelvis and marked hydroureter with diffuse urothelial thickening of the renal pelvis and ureter. Gas bubbles within the dilated ureter and urinary bladder, presumably related to recent instrumentation. Patchy hypoenhancement of the right renal cortex on delayed views suspicious for pyelonephritis. 2. Prominent bladder distension with Foley catheter in place, correlate for Foley function. 3. Interval small  to moderate bilateral pleural effusions with septal thickening and patchy ground-glass disease in the lower lobes, findings could be secondary to pulmonary edema or infection. Generalized subcutaneous edema consistent with anasarca. 4. Aortic atherosclerosis. Aortic Atherosclerosis (ICD10-I70.0). Electronically Signed   By: Luke Bun M.D.   On: 05/11/2024 23:30   DG Chest Portable 1 View Result Date: 05/11/2024 CLINICAL DATA:  hf sob EXAM: PORTABLE CHEST 1 VIEW COMPARISON:  Chest x-ray 03/02/2024 FINDINGS: The heart and mediastinal contours are within normal limits.  No focal consolidation. No pulmonary edema. Interval development of bilateral small pleural effusions. No pneumothorax. No acute osseous abnormality. IMPRESSION: Interval development of bilateral small pleural effusions. Electronically Signed   By: Morgane  Naveau M.D.   On: 05/11/2024 21:47     .Critical Care  Performed by: Nettie Earing, MD Authorized by: Nettie Earing, MD   Critical care provider statement:    Critical care time (minutes):  75   Critical care end time:  05/12/2024 1:35 AM   Critical care was necessary to treat or prevent imminent or life-threatening deterioration of the following conditions:  Endocrine crisis and cardiac failure   Critical care was time spent personally by me on the following activities:  Development of treatment plan with patient or surrogate, discussions with consultants, evaluation of patient's response to treatment, examination of patient, ordering and review of laboratory studies, ordering and review of radiographic studies, ordering and performing treatments and interventions, pulse oximetry, re-evaluation of patient's condition and review of old charts   I assumed direction of critical care for this patient from another provider in my specialty: yes     Care discussed with: admitting provider   Comments:     1210 Discussion by Dr. Spratte  .Foreign Body Removal  Date/Time: 05/12/2024  3:34 AM  Performed by: Nettie Earing, MD Authorized by: Nettie Earing, MD  Patient identity confirmed: arm band Intake: ureter.  Sedation: Patient sedated: no  Patient restrained: no Patient cooperative: yes Complexity: simple 1 objects recovered. Objects recovered: infected ureteral stent Post-procedure assessment: foreign body removed Patient tolerance: patient tolerated the procedure well with no immediate complications     Medications Ordered in the ED  ceFEPIme (MAXIPIME) 2 g in sodium chloride  0.9 % 100 mL IVPB (2 g Intravenous New Bag/Given 05/12/24 0115)  furosemide  (LASIX ) injection 40 mg (0 mg Intravenous Hold 05/12/24 0130)  lactated ringers  bolus 1,000 mL (0 mLs Intravenous Stopped 05/12/24 0040)  potassium chloride  SA (KLOR-CON  M) CR tablet 40 mEq (40 mEq Oral Given 05/11/24 2225)  iohexol  (OMNIPAQUE ) 350 MG/ML injection 75 mL (75 mLs Intravenous Contrast Given 05/11/24 2302)  potassium chloride  SA (KLOR-CON  M) CR tablet 20 mEq (20 mEq Oral Given 05/12/24 0130)    Clinical Course as of 05/12/24 0133  Thu May 11, 2024  2322 Laboratory workup notable for recurrent hypokalemia of 3.1.  Will provide oral repletion.  BNP elevated at 2200.  Small bilateral pleural effusions.  Remained stable on room air.  Initial blood glucose in the 400s.  Will recheck CBG after IV fluids.  LILLETTE Ozell Marine DO, am transitioning care of this patient to the oncoming provider pending CT abdomen pelvis reevaluation and disposition [MP]    Clinical Course User Index [MP] Marine Ozell LABOR, DO                                 Medical Decision Making Patient with SOB called cardiology as off diuretics   Amount and/or Complexity of Data Reviewed Independent Historian: EMS    Details: See above  External Data Reviewed: labs and notes.    Details: Initial anion gap 18 now 22  Labs: ordered.    Details: Urine is consistent with pyelonephritis and has fungi.  Elevated troponin 25/ 31.  Normal  white 8.5, low hemoglobin 9.1, normal platelets  Radiology: ordered and independent interpretation performed.    Details: > 1 L in bladder on CT by me CHF on  lungs on CT with effusions  Discussion of management or test interpretation with external provider(s): Case d/w Dr. Spratte by phone.  Please replace foley catheter.   Case d/w Dr.Spratte by chat is aware stent was pulled and this is fine as it was due for removal.  Please admit at [facility] and keep NPO for procedure.  If patient becomes febrile add vancomycin  Risk Prescription drug management. Decision regarding hospitalization.     Final diagnoses:  Hyperglycemia due to diabetes mellitus (HCC)  Congestive heart failure, unspecified HF chronicity, unspecified heart failure type (HCC)  Acute urinary retention  Hypokalemia  Pyelonephritis   The patient appears reasonably stabilized for admission considering the current resources, flow, and capabilities available in the ED at this time, and I doubt any other [facility] requiring further screening and/or treatment in the ED prior to admission.  ED Discharge Orders     None          Kessler Kopinski, MD 05/12/24 [phone]

## 2024-05-13 ENCOUNTER — Inpatient Hospital Stay (HOSPITAL_COMMUNITY)

## 2024-05-13 DIAGNOSIS — E1010 Type 1 diabetes mellitus with ketoacidosis without coma: Secondary | ICD-10-CM | POA: Diagnosis not present

## 2024-05-13 LAB — COMPREHENSIVE METABOLIC PANEL WITH GFR
ALT: 16 U/L (ref 0–44)
AST: 29 U/L (ref 15–41)
Albumin: 3.1 g/dL — ABNORMAL LOW (ref 3.5–5.0)
Alkaline Phosphatase: 250 U/L — ABNORMAL HIGH (ref 38–126)
Anion gap: 10 (ref 5–15)
BUN: 12 mg/dL (ref 8–23)
CO2: 23 mmol/L (ref 22–32)
Calcium: 8.6 mg/dL — ABNORMAL LOW (ref 8.9–10.3)
Chloride: 104 mmol/L (ref 98–111)
Creatinine, Ser: 0.8 mg/dL (ref 0.44–1.00)
GFR, Estimated: >60 mL/min (ref >60)
Glucose, Bld: 81 mg/dL (ref 70–99)
Potassium: 3.9 mmol/L (ref 3.5–5.1)
Sodium: 137 mmol/L (ref 135–145)
Total Bilirubin: 0.3 mg/dL (ref 0.0–1.2)
Total Protein: 6.5 g/dL (ref 6.5–8.1)

## 2024-05-13 LAB — GLUCOSE, CAPILLARY
Glucose-Capillary: 62 mg/dL — ABNORMAL LOW (ref 70–99)
Glucose-Capillary: 68 mg/dL — ABNORMAL LOW (ref 70–99)
Glucose-Capillary: 77 mg/dL (ref 70–99)
Glucose-Capillary: 145 mg/dL — ABNORMAL HIGH (ref 70–99)
Glucose-Capillary: 157 mg/dL — ABNORMAL HIGH (ref 70–99)
Glucose-Capillary: 141 mg/dL — ABNORMAL HIGH (ref 70–99)

## 2024-05-13 LAB — CBC
HCT: 26.8 % — ABNORMAL LOW (ref 36.0–46.0)
Hemoglobin: 7.9 g/dL — ABNORMAL LOW (ref 12.0–15.0)
MCH: 24.3 pg — ABNORMAL LOW (ref 26.0–34.0)
MCHC: 29.5 g/dL — ABNORMAL LOW (ref 30.0–36.0)
MCV: 82.5 fL (ref 80.0–100.0)
Platelets: 345 K/uL (ref 150–400)
RBC: 3.25 MIL/uL — ABNORMAL LOW (ref 3.87–5.11)
RDW: 16.3 % — ABNORMAL HIGH (ref 11.5–15.5)
WBC: 8.6 K/uL (ref 4.0–10.5)
nRBC: 0.2 % (ref 0.0–0.2)

## 2024-05-13 LAB — PHOSPHORUS: Phosphorus: 2.5 mg/dL (ref 2.5–4.6)

## 2024-05-13 LAB — MAGNESIUM: Magnesium: 1.8 mg/dL (ref 1.7–2.4)

## 2024-05-13 LAB — URINE CULTURE: Culture: >=100000 — AB

## 2024-05-13 LAB — PRO BRAIN NATRIURETIC PEPTIDE: Pro Brain Natriuretic Peptide: 7997 pg/mL — ABNORMAL HIGH (ref <300.0)

## 2024-05-13 MED ORDER — FUROSEMIDE 10 MG/ML IJ SOLN
40.0000 mg | Freq: Every day | INTRAMUSCULAR | Status: DC
  Administered 2024-05-14 – 2024-05-16 (×3): 40 mg via INTRAVENOUS
  Filled 2024-05-13 (×3): qty 4

## 2024-05-13 MED ORDER — MORPHINE SULFATE (PF) 2 MG/ML IV SOLN
2.0000 mg | Freq: Once | INTRAVENOUS | Status: AC
  Administered 2024-05-13: 2 mg via INTRAVENOUS
  Filled 2024-05-13: qty 1

## 2024-05-13 MED ORDER — FUROSEMIDE 10 MG/ML IJ SOLN
40.0000 mg | Freq: Once | INTRAMUSCULAR | Status: AC
  Administered 2024-05-13: 40 mg via INTRAVENOUS
  Filled 2024-05-13: qty 4

## 2024-05-13 MED ORDER — FUROSEMIDE 10 MG/ML IJ SOLN: 20.0000 mg | Freq: Once | INTRAMUSCULAR | Status: DC

## 2024-05-13 MED ORDER — ALUM & MAG HYDROXIDE-SIMETH 200-200-20 MG/5ML PO SUSP: 30.0000 mL | ORAL | Status: DC | PRN

## 2024-05-13 NOTE — Progress Notes (Signed)
 PROGRESS NOTE    Brooke [name]  FMW:[id number] DOB: [birth year] DOA: 05/11/2024 PCP: [name], MD   Brief Narrative:  This 63 years old female with PMH significant for poorly controlled type 1 diabetes, chronic diastolic heart failure presented in the ED with shortness of breath for 2 days associated with right flank pain, fever and increasing peripheral edema.  Patient has history of poorly controlled type 1 diabetes with most recent hemoglobin A1c 10.7 on 04/20/2024.  Patient recently underwent right ureteral stent placement in the setting of hydronephrosis.  She is found to have UA positive for UTI, started on empiric antibiotics.  Labs also consistent with DKA,  started on insulin  drip.  Patient was admitted for DKA in the setting of pyelonephritis.  Continued on IV antibiotics and urology is consulted. ID is consulted to direct antibiotic regimen long-term due to recurrent UTIs.  Assessment & Plan:   Principal Problem:   DKA (diabetic ketoacidosis) (HCC) Active Problems:   Hypokalemia   Pyelonephritis   Chronic diastolic CHF (congestive heart failure) (HCC)   Anemia of chronic disease   Hydroureter on right   Diabetic ketoacidosis : Labs consistent with DKA in the setting of pyelonephritis. Continued on IV insulin  and IV fluids as per protocol Anion gap closed, successfully transitioned to subcu insulin . Her diabetes remains uncontrolled last HbA1c 10.7. Continue Lantus  15 units and sliding scale. Diabetic coordinator consulted.  Pyelonephritis: Patient recently underwent ureteral stent which fell off. Patient does not meet criteria for sepsis. Continue IV antibiotics for now. Urology consulted, No need for stenting right collecting system. Continue IV fluconazole  and broad-spectrum antibiotics. ID consulted for recommendation on long-term treatment. Patient can be discharged on Bactrim  DS p.o. twice daily for 10 days. Remove Foley catheter if no longer needed given  prior history of Candida colonization.  Chronic diastolic CHF: Patient had shortness of breath and peripheral edema on admission. proBNP 2255, chest x-ray shows bilateral small pleural effusions Continue Lasix  40 mg IV daily,  Monitor intake output charting, daily weight.   Anemia of chronic disease: H&H remains stable.  DVT prophylaxis: SCDs Code Status: Full code Family Communication: No family at bedside.  Disposition Plan: Status is: Inpatient  Remains inpatient appropriate because: Severity of illness.   Consultants:  None  Procedures:  Antimicrobials:  Anti-infectives (From admission, onward)    Start     Dose/Rate Route Frequency Ordered Stop   05/12/24 1100  cefTRIAXone  (ROCEPHIN ) 2 g in sodium chloride  0.9 % 100 mL IVPB        2 g 200 mL/hr over 30 Minutes Intravenous Every 24 hours 05/12/24 1009     05/12/24 0800  ceFEPIme (MAXIPIME) 2 g in sodium chloride  0.9 % 100 mL IVPB  Status:  Discontinued        2 g 200 mL/hr over 30 Minutes Intravenous Every 8 hours 05/12/24 0551 05/12/24 1009   05/12/24 0200  fluconazole  (DIFLUCAN ) IVPB 200 mg        200 mg 100 mL/hr over 60 Minutes Intravenous  Once 05/12/24 0136 05/12/24 0329   05/12/24 0045  ceFEPIme (MAXIPIME) 2 g in sodium chloride  0.9 % 100 mL IVPB        2 g 200 mL/hr over 30 Minutes Intravenous  Once 05/12/24 0033 05/12/24 0145   05/11/24 2345  cefTRIAXone  (ROCEPHIN ) 2 g in sodium chloride  0.9 % 100 mL IVPB  Status:  Discontinued        2 g 200 mL/hr over 30 Minutes Intravenous  Once  05/11/24 2343 05/12/24 0032       Subjective: Patient was seen and examined at bedside.  Overnight events noted. Patient reports feeling better,  She reports having some pain while swallowing.  Objective: Vitals:   05/13/24 0600 05/13/24 0700 05/13/24 0800 05/13/24 0900  BP: 119/70 109/67 118/69 108/66  Pulse: 87 88 88 88  Resp: 13 15 17 16   Temp:   98.2 F (36.8 C)   TempSrc:   Oral   SpO2: 97% 97% 95% 97%  Weight:       Height:        Intake/Output Summary (Last 24 hours) at 05/13/2024 1133 Last data filed at 05/13/2024 1000 Gross per 24 hour  Intake 1624.43 ml  Output 1175 ml  Net 449.43 ml   Filed Weights   05/11/24 2055  Weight: 53 kg    Examination:  General exam: Appears calm and comfortable, not in any acute distress.  Respiratory system: CTA Bilaterally. Respiratory effort normal. RR 14 Cardiovascular system: S1 & S2 heard, RRR. No JVD, murmurs, rubs, gallops or clicks.  Gastrointestinal system: Abdomen is non distended, soft and nontender.  Normal bowel sounds heard. Central nervous system: Alert and oriented x 3. No focal neurological deficits. Extremities: Edema+, no cyanosis, no clubbing. Skin: No rashes, lesions or ulcers Psychiatry: Judgement and insight appear normal. Mood & affect appropriate.    Data Reviewed: I have personally reviewed following labs and imaging studies  CBC: Recent Labs  Lab 05/11/24 2100 05/11/24 2128 05/12/24 0142 05/12/24 0607 05/13/24 0313  WBC 8.5  --   --  9.2 8.6  NEUTROABS 6.9  --   --  6.8  --   HGB 9.1* 9.5*  9.9* 9.2* 7.9* 7.9*  HCT 28.8* 28.0*  29.0* 27.0* 26.5* 26.8*  MCV 82.1  --   --  83.1 82.5  PLT 432*  --   --  358 345   Basic Metabolic Panel: Recent Labs  Lab 05/12/24 0607 05/12/24 1002 05/12/24 1412 05/12/24 1834 05/13/24 0313  NA 140 136 137 138 137  K 3.5 4.4 4.4 4.0 3.9  CL 103 103 104 104 104  CO2 21* 21* 22 23 23   GLUCOSE 170* 125* 132* 91 81  BUN 12 13 13 12 12   CREATININE 0.84 0.92 0.79 0.82 0.80  CALCIUM  8.5* 8.5* 8.3* 8.3* 8.6*  MG 1.9  --   --   --  1.8  PHOS  --   --   --   --  2.5   GFR: Estimated Creatinine Clearance: 61.8 mL/min (by C-G formula based on SCr of 0.8 mg/dL). Liver Function Tests: Recent Labs  Lab 05/11/24 2100 05/13/24 0313  AST 18 29  ALT 11 16  ALKPHOS 127* 250*  BILITOT 0.4 0.3  PROT 7.7 6.5  ALBUMIN 2.8* 3.1*   No results for input(s): LIPASE, AMYLASE in the  last 168 hours. No results for input(s): AMMONIA in the last 168 hours. Coagulation Profile: Recent Labs  Lab 05/12/24 0607  INR 1.3*   Cardiac Enzymes: No results for input(s): CKTOTAL, CKMB, CKMBINDEX, TROPONINI in the last 168 hours. BNP (last 3 results) Recent Labs    05/13/24 0308  PROBNP 7,997.0*   HbA1C: No results for input(s): HGBA1C in the last 72 hours. CBG: Recent Labs  Lab 05/12/24 1752 05/12/24 2221 05/13/24 0755 05/13/24 0827 05/13/24 0852  GLUCAP 115* 118* 62* 68* 77   Lipid Profile: No results for input(s): CHOL, HDL, LDLCALC, TRIG, CHOLHDL, LDLDIRECT in the last  72 hours. Thyroid Function Tests: No results for input(s): TSH, T4TOTAL, FREET4, T3FREE, THYROIDAB in the last 72 hours. Anemia Panel: No results for input(s): VITAMINB12, FOLATE, FERRITIN, TIBC, IRON , RETICCTPCT in the last 72 hours. Sepsis Labs: No results for input(s): PROCALCITON, LATICACIDVEN in the last 168 hours.  Recent Results (from the past 240 hours)  Resp panel by RT-PCR (RSV, Flu A&B, Covid) Anterior Nasal Swab     Status: None   Collection Time: 05/11/24  9:15 PM   Specimen: Anterior Nasal Swab  Result Value Ref Range Status   SARS Coronavirus 2 by RT PCR NEGATIVE NEGATIVE Final   Influenza A by PCR NEGATIVE NEGATIVE Final   Influenza B by PCR NEGATIVE NEGATIVE Final    Comment: (NOTE) The Xpert Xpress SARS-CoV-2/FLU/RSV plus assay is intended as an aid in the diagnosis of influenza from Nasopharyngeal swab specimens and should not be used as a sole basis for treatment. Nasal washings and aspirates are unacceptable for Xpert Xpress SARS-CoV-2/FLU/RSV testing.  Fact Sheet for Patients: BloggerCourse.com  Fact Sheet for Healthcare Providers: SeriousBroker.it  This test is not yet approved or cleared by the [place]  FDA and has been authorized for detection and/or  diagnosis of SARS-CoV-2 by FDA under an Emergency Use Authorization (EUA). This EUA will remain in effect (meaning this test can be used) for the duration of the COVID-19 declaration under Section 564(b)(1) of the Act, 21 U.S.C. section 360bbb-3(b)(1), unless the authorization is terminated or revoked.     Resp Syncytial Virus by PCR NEGATIVE NEGATIVE Final    Comment: (NOTE) Fact Sheet for Patients: BloggerCourse.com  Fact Sheet for Healthcare Providers: SeriousBroker.it  This test is not yet approved or cleared by the [place]  FDA and has been authorized for detection and/or diagnosis of SARS-CoV-2 by FDA under an Emergency Use Authorization (EUA). This EUA will remain in effect (meaning this test can be used) for the duration of the COVID-19 declaration under Section 564(b)(1) of the Act, 21 U.S.C. section 360bbb-3(b)(1), unless the authorization is terminated or revoked.  Performed at [facility], [address] [place], [place] [address]   Blood culture (routine x 2)     Status: None (Preliminary result)   Collection Time: 05/11/24 11:44 PM   Specimen: BLOOD  Result Value Ref Range Status   Specimen Description BLOOD LEFT ANTECUBITAL  Final   Special Requests   Final    BOTTLES DRAWN AEROBIC AND ANAEROBIC Blood Culture results may not be optimal due to an inadequate volume of blood received in culture bottles   Culture   Final    NO GROWTH 1 DAY Performed at [facility], [address], [place], [place] [address]    Report Status PENDING  Incomplete  Blood culture (routine x 2)     Status: None (Preliminary result)   Collection Time: 05/11/24 11:49 PM   Specimen: BLOOD  Result Value Ref Range Status   Specimen Description BLOOD RIGHT ANTECUBITAL  Final   Special Requests   Final    BOTTLES DRAWN AEROBIC AND ANAEROBIC Blood Culture results may not be optimal due to an inadequate volume of blood  received in culture bottles   Culture   Final    NO GROWTH 1 DAY Performed at [facility], [address] [place], [place] [address]    Report Status PENDING  Incomplete  MRSA Next Gen by PCR, Nasal     Status: None   Collection Time: 05/12/24  5:11  AM   Specimen: Nasal Mucosa; Nasal Swab  Result Value Ref Range Status   MRSA by PCR Next Gen NOT DETECTED NOT DETECTED Final    Comment: (NOTE) The GeneXpert MRSA Assay (FDA approved for NASAL specimens only), is one component of a comprehensive MRSA colonization surveillance program. It is not intended to diagnose MRSA infection nor to guide or monitor treatment for MRSA infections. Test performance is not FDA approved in patients less than 19 years old. Performed at [facility], [address] [place], [place] [address]      Radiology Studies: DG CHEST PORT 1 VIEW Result Date: 05/13/2024 CLINICAL DATA:  Shortness of breath. EXAM: PORTABLE CHEST 1 VIEW COMPARISON:  05/11/2024 FINDINGS: Interval progression of bibasilar atelectasis/infiltrate with persistent small bilateral pleural effusions. No overt pulmonary edema. The cardio pericardial silhouette is enlarged. Telemetry leads overlie the chest. IMPRESSION: Interval progression of bibasilar atelectasis/infiltrate with persistent small bilateral pleural effusions. Electronically Signed   By: Camellia Candle M.D.   On: 05/13/2024 08:11   CT ABDOMEN PELVIS W CONTRAST Result Date: 05/11/2024 CLINICAL DATA:  Left lower quadrant pain EXAM: CT ABDOMEN AND PELVIS WITH CONTRAST TECHNIQUE: Multidetector CT imaging of the abdomen and pelvis was performed using the standard protocol following bolus administration of intravenous contrast. RADIATION DOSE REDUCTION: This exam was performed according to the departmental dose-optimization program which includes automated exposure control, adjustment of the mA and/or kV according to patient size and/or use of iterative  reconstruction technique. CONTRAST:  75mL OMNIPAQUE  IOHEXOL  350 MG/ML SOLN COMPARISON:  CT 03/02/2024 FINDINGS: Lower chest: Lung bases demonstrate interval small moderate bilateral pleural effusions. Septal thickening at the right middle lobe lingula and lung bases. Patchy ground-glass disease in the lower lobes. Hepatobiliary: No focal liver abnormality is seen. No gallstones, gallbladder wall thickening, or biliary dilatation. Pancreas: Unremarkable. No pancreatic ductal dilatation or surrounding inflammatory changes. Spleen: Normal in size without focal abnormality. Adrenals/Urinary Tract: Adrenal glands are normal. Interval placement of right-sided ureteral stent with proximal pigtail in the right renal collecting system and distal pigtail in the bladder. Persistent mild dilatation of the right renal pelvis and marked hydroureter with diffuse urothelial thickening of renal pelvis and ureter. Gas bubbles within the dilated ureter. Gas within the urinary bladder. Urinary bladder appears distended. A Foley catheter is present in the bladder. Delayed views demonstrate striated nephrogram on the right with patchy areas of cortical hypoenhancement. Poor excretion of contrast from the right greater than left kidneys on delayed views. Stomach/Bowel: Stomach within normal limits. No dilated small bowel. No acute bowel wall thickening. Vascular/Lymphatic: Aortic atherosclerosis. No aneurysm. Circumaortic left renal vein. Multiple subcentimeter retroperitoneal nodes. Reproductive: Uterus and bilateral adnexa are unremarkable. Other: Negative for ascites. No free air. Generalized subcutaneous edema. Musculoskeletal: No acute or suspicious osseous abnormality. IMPRESSION: 1. Interval placement of right-sided ureteral stent with proximal pigtail in the right renal collecting system and distal pigtail in the bladder. Persistent mild dilatation of the right renal pelvis and marked hydroureter with diffuse urothelial thickening  of the renal pelvis and ureter. Gas bubbles within the dilated ureter and urinary bladder, presumably related to recent instrumentation. Patchy hypoenhancement of the right renal cortex on delayed views suspicious for pyelonephritis. 2. Prominent bladder distension with Foley catheter in place, correlate for Foley function. 3. Interval small to moderate bilateral pleural effusions with septal thickening and patchy ground-glass disease in the lower lobes, findings could be secondary to pulmonary edema or infection. Generalized subcutaneous edema consistent with anasarca. 4. Aortic  atherosclerosis. Aortic Atherosclerosis (ICD10-I70.0). Electronically Signed   By: Luke Bun M.D.   On: 05/11/2024 23:30   DG Chest Portable 1 View Result Date: 05/11/2024 CLINICAL DATA:  hf sob EXAM: PORTABLE CHEST 1 VIEW COMPARISON:  Chest x-ray 03/02/2024 FINDINGS: The heart and mediastinal contours are within normal limits. No focal consolidation. No pulmonary edema. Interval development of bilateral small pleural effusions. No pneumothorax. No acute osseous abnormality. IMPRESSION: Interval development of bilateral small pleural effusions. Electronically Signed   By: Morgane  Naveau M.D.   On: 05/11/2024 21:47   Scheduled Meds:  Chlorhexidine  Gluconate Cloth  6 each Topical Daily   [START ON 05/14/2024] furosemide   40 mg Intravenous Daily   insulin  aspart  0-9 Units Subcutaneous TID WC   insulin  glargine  20 Units Subcutaneous Daily   Continuous Infusions:  cefTRIAXone  (ROCEPHIN )  IV 2 g (05/13/24 1104)   insulin  Stopped (05/12/24 1755)     LOS: 1 day    Time spent: 50 Mins    Darcel Dawley, MD [facility]   If 7PM-7AM, please contact night-coverage

## 2024-05-13 NOTE — Progress Notes (Signed)
     Patient Name: Brooke [name]           DOB: [birth year]  MRN: [id number]      Admission Date: 05/11/2024  Attending Provider: Leotis Bogus, MD  Primary Diagnosis: DKA (diabetic ketoacidosis) (HCC)   Level of care: Stepdown   OVERNIGHT EVENT  Notified by bedside RN of patient experiencing epigastric pain that moves to her right chest.  Pain level 8/10. Other associated symptoms include nausea, heartburn, shortness of breath. Patient has a history of type 1 diabetes, chronic diastolic heart failure, unspecified chest pain, CAD, GERD. Pain is not reproducible with chest wall palpation. Vital signs stable, currently on room air satting 97%. Breath sounds clear, unlabored.    On admission, patient also had SOB.  Found to have elevated BNP 2255. + Peripheral edema.  Chest x-ray showing interval development of bilateral small pleural effusions.  New chest x-ray pending this morning. .  Plan: Maalox for heartburn IV morphine  for severe pain x 1 EKG Troponin 40 mg IV Lasix    Arvon Schreiner, DNP, ACNPC- AG [facility] [place]

## 2024-05-14 DIAGNOSIS — E1010 Type 1 diabetes mellitus with ketoacidosis without coma: Secondary | ICD-10-CM | POA: Diagnosis not present

## 2024-05-14 LAB — GLUCOSE, CAPILLARY
Glucose-Capillary: 122 mg/dL — ABNORMAL HIGH (ref 70–99)
Glucose-Capillary: 99 mg/dL (ref 70–99)
Glucose-Capillary: 104 mg/dL — ABNORMAL HIGH (ref 70–99)
Glucose-Capillary: 170 mg/dL — ABNORMAL HIGH (ref 70–99)

## 2024-05-14 LAB — BASIC METABOLIC PANEL WITH GFR
Anion gap: 12 (ref 5–15)
BUN: 14 mg/dL (ref 8–23)
CO2: 24 mmol/L (ref 22–32)
Calcium: 8.6 mg/dL — ABNORMAL LOW (ref 8.9–10.3)
Chloride: 104 mmol/L (ref 98–111)
Creatinine, Ser: 0.83 mg/dL (ref 0.44–1.00)
GFR, Estimated: >60 mL/min (ref >60)
Glucose, Bld: 118 mg/dL — ABNORMAL HIGH (ref 70–99)
Potassium: 4.2 mmol/L (ref 3.5–5.1)
Sodium: 140 mmol/L (ref 135–145)

## 2024-05-14 LAB — CBC
HCT: 25.6 % — ABNORMAL LOW (ref 36.0–46.0)
Hemoglobin: 7.9 g/dL — ABNORMAL LOW (ref 12.0–15.0)
MCH: 25.1 pg — ABNORMAL LOW (ref 26.0–34.0)
MCHC: 30.9 g/dL (ref 30.0–36.0)
MCV: 81.3 fL (ref 80.0–100.0)
Platelets: 295 K/uL (ref 150–400)
RBC: 3.15 MIL/uL — ABNORMAL LOW (ref 3.87–5.11)
RDW: 16.6 % — ABNORMAL HIGH (ref 11.5–15.5)
WBC: 7.1 K/uL (ref 4.0–10.5)
nRBC: 0 % (ref 0.0–0.2)

## 2024-05-14 MED ORDER — MORPHINE SULFATE (PF) 2 MG/ML IV SOLN
2.0000 mg | INTRAVENOUS | Status: DC | PRN
  Administered 2024-05-14 – 2024-05-17 (×7): 2 mg via INTRAVENOUS
  Filled 2024-05-14 (×7): qty 1

## 2024-05-14 MED ORDER — ENSURE PLUS HIGH PROTEIN PO LIQD
237.0000 mL | Freq: Two times a day (BID) | ORAL | Status: DC
Start: 2024-05-14 — End: 2024-05-17
  Administered 2024-05-15 – 2024-05-16 (×3): 237 mL via ORAL

## 2024-05-14 MED ORDER — DIPHENHYDRAMINE HCL 25 MG PO CAPS
25.0000 mg | ORAL_CAPSULE | Freq: Once | ORAL | Status: AC | PRN
  Administered 2024-05-14: 25 mg via ORAL
  Filled 2024-05-14: qty 1

## 2024-05-14 MED ORDER — HYDROCODONE-ACETAMINOPHEN 5-325 MG PO TABS: 1.0000 | ORAL_TABLET | Freq: Three times a day (TID) | ORAL | Status: DC | PRN

## 2024-05-14 MED ORDER — CLOPIDOGREL BISULFATE 75 MG PO TABS
75.0000 mg | ORAL_TABLET | Freq: Every day | ORAL | Status: DC
  Administered 2024-05-14 – 2024-05-17 (×4): 75 mg via ORAL
  Filled 2024-05-14 (×4): qty 1

## 2024-05-14 MED ORDER — ASPIRIN 81 MG PO CHEW
81.0000 mg | CHEWABLE_TABLET | Freq: Every day | ORAL | Status: DC
  Administered 2024-05-14 – 2024-05-16 (×3): 81 mg via ORAL
  Filled 2024-05-14 (×3): qty 1

## 2024-05-14 MED ORDER — PROMETHAZINE (PHENERGAN) 6.25MG IN NS 50ML IVPB: 6.2500 mg | Freq: Four times a day (QID) | INTRAVENOUS | Status: DC | PRN

## 2024-05-14 MED ORDER — ATORVASTATIN CALCIUM 40 MG PO TABS
80.0000 mg | ORAL_TABLET | Freq: Every day | ORAL | Status: DC
  Administered 2024-05-14 – 2024-05-16 (×3): 80 mg via ORAL
  Filled 2024-05-14 (×3): qty 2

## 2024-05-14 MED ORDER — INSULIN GLARGINE 100 UNIT/ML ~~LOC~~ SOLN
3.0000 [IU] | Freq: Every day | SUBCUTANEOUS | Status: DC
  Administered 2024-05-14 – 2024-05-17 (×4): 3 [IU] via SUBCUTANEOUS
  Filled 2024-05-14 (×4): qty 0.03

## 2024-05-14 MED ORDER — FERROUS SULFATE 325 (65 FE) MG PO TABS
325.0000 mg | ORAL_TABLET | Freq: Every day | ORAL | Status: DC
  Administered 2024-05-14 – 2024-05-17 (×4): 325 mg via ORAL
  Filled 2024-05-14 (×4): qty 1

## 2024-05-14 NOTE — Evaluation (Signed)
 Clinical/Bedside Swallow Evaluation Patient Details  Name: Brooke [name] MRN: [id number] Date of Birth: 1961/06/22  Today's Date: 05/14/2024 Time: SLP Start Time (ACUTE ONLY): 9146 SLP Stop Time (ACUTE ONLY): 0909 SLP Time Calculation (min) (ACUTE ONLY): 16 min  Past Medical History:  Past Medical History:  Diagnosis Date   Abdominal pain, left lower quadrant 12/17/2015   Anemia    Anxiety 07/01/2016   Carotid artery disease 10/27/2022   Chest pain, unspecified 10/05/2003   CHF (congestive heart failure) (HCC)    Chronic systolic heart failure (HCC) 02/20/2022   Depression, major, recurrent, moderate (HCC) 07/31/2005   Diabetes mellitus without complication (HCC) 09/08/2006   Endometriosis 03/28/1996   GERD (gastroesophageal reflux disease)    Heart murmur    History of colonic polyps 08/07/2015   20 mm removed 05/10/15 by Dr. Kristie     PAD (peripheral artery disease)    Unspecified constipation 06/24/2004   Past Surgical History:  Past Surgical History:  Procedure Laterality Date   ABDOMINAL AORTOGRAM W/LOWER EXTREMITY Left 07/29/2023   Procedure: ABDOMINAL AORTOGRAM W/LOWER EXTREMITY;  Surgeon: Gretta Lonni PARAS, MD;  Location: [facility] INVASIVE CV LAB;  Service: Cardiovascular;  Laterality: Left;   APPENDECTOMY  08/10/1994   Dr. Buford   BREAST SURGERY Bilateral 08/10/1994   Dr. Ardeen   calcifications removed   CYSTOSCOPY W/ RETROGRADES Right 05/01/2024   Procedure: CYSTOSCOPY, WITH RETROGRADE PYELOGRAM;  Surgeon: Selma Donnice SAUNDERS, MD;  Location: [facility] ORS;  Service: Urology;  Laterality: Right;   CYSTOSCOPY W/ URETERAL STENT PLACEMENT Right 03/02/2024   Procedure: CYSTOSCOPY, WITH RETROGRADE PYELOGRAM AND URETERAL STENT INSERTION;  Surgeon: Selma Donnice SAUNDERS, MD;  Location: [facility] OR;  Service: Urology;  Laterality: Right;   CYSTOSCOPY WITH BIOPSY N/A 05/01/2024   Procedure: CYSTOSCOPY, WITH URETERAL BIOPSY;  Surgeon: Selma Donnice SAUNDERS, MD;  Location: [facility] ORS;  Service: Urology;  Laterality:  N/A;   CYSTOSCOPY/URETEROSCOPY/HOLMIUM LASER/STENT PLACEMENT Right 05/01/2024   Procedure: CYSTOSCOPY/URETEROSCOPY/EXCHANGE;  Surgeon: Selma Donnice SAUNDERS, MD;  Location: [facility] ORS;  Service: Urology;  Laterality: Right;   ESOPHAGEAL MANOMETRY N/A 03/23/2023   Procedure: ESOPHAGEAL MANOMETRY (EM);  Surgeon: Leigh Elspeth SQUIBB, MD;  Location: [facility] ENDOSCOPY;  Service: Gastroenterology;  Laterality: N/A;   INGUINAL HERNIA REPAIR     OVARIAN CYST REMOVAL     PERIPHERAL VASCULAR BALLOON ANGIOPLASTY Left 07/29/2023   Procedure: PERIPHERAL VASCULAR BALLOON ANGIOPLASTY;  Surgeon: Gretta Lonni PARAS, MD;  Location: [facility] INVASIVE CV LAB;  Service: Cardiovascular;  Laterality: Left;   RIGHT/LEFT HEART CATH AND CORONARY ANGIOGRAPHY N/A 10/09/2021   Procedure: RIGHT/LEFT HEART CATH AND CORONARY ANGIOGRAPHY;  Surgeon: Wendel Lurena POUR, MD;  Location: [facility] INVASIVE CV LAB;  Service: Cardiovascular;  Laterality: N/A;   TOOTH EXTRACTION N/A 07/28/2022   Procedure: EXTRACTION ALL REMAING TEETH THREE, FOUR, FIVE, SIX, SEVEN, EIGHT, NINE, TEN, ELEVEN, TWELVE, TWENTY ONE, TWENTY TWO, TWENTY THREE, TWENTY FOUR, TWENTY FIVE, TWENTY SIX, TWENTY SEVEN, TWENTY EIGHT, TWENTY NINE, THIRTY, AVEOLOPLASTY;  Surgeon: Sheryle Hamilton, DMD;  Location: [facility] OR;  Service: Oral Surgery;  Laterality: N/A;   HPI:  Patient is a 63 y.o. female with PMH: poorlly controlled DM-1, chronic diastolic heart failure, esophageal dysphagia (has been followed by GI) who was admitted to [facility] on 05/11/24 with DKA in the setting of right sided pyelonephritis after presenting from home to [facility] ED c/o SOB. She recently underwent right ureteral stent placement and was discharged home on Foley catheter. She has had no urine output over the past few days. SLP swallow evaluation  ordered secondary to her c/o pain and difficulty with swallowing.    Assessment / Plan / Recommendation  Clinical Impression  Patient presents with clinical s/s of dysphagia as per this bedside  swallow evaluation. Based on her history of moderate esophageal dysmotility (despite no esophageal mass or stricture seen on 2023 Esophagram) and her c/o difficulty swallowing mainly solid foods and globus sensation, SLP suspects her dysphagia is either primarily esophageal in nature or pharyngoesophageal. She told SLP that she has found if she moves her head slightly to the left when swallowing, she will have a very intense globus sensation in her throat and this will cause her to cough. She eats very small bites of foods for fear of choking. When she took a sip of water  during this evaluation, she described feeling a tickle but nothing that was making her feel need to cough. With soft solids (scrambled eggs), she exhibited multiple swallows, appearing to struggle with pharyngeal transit of bolus, followed by a delayed cough. SLP discussed options with patient and recommendation is for MBS to f/o pharyngeal phase dysphagia. Patient may complete this test while admitted or as an outpatient. SLP Visit Diagnosis: Dysphagia, pharyngoesophageal phase (R13.14)    Aspiration Risk  Mild aspiration risk    Diet Recommendation Regular;Thin liquid    Liquid Administration via: Cup;Straw Medication Administration: Other (Comment) (as tolerated) Supervision: Patient able to self feed Compensations: Slow rate;Small sips/bites    Other  Recommendations Oral Care Recommendations: Oral care BID;Patient independent with oral care     Assistance Recommended at Discharge    Functional Status Assessment Patient has had a recent decline in their functional status and demonstrates the ability to make significant improvements in function in a reasonable and predictable amount of time.  Frequency and Duration min 1 x/week  1 week       Prognosis Prognosis for improved oropharyngeal function: Fair Barriers to Reach Goals: Time post onset      Swallow Study   General Date of Onset: 05/13/24 HPI: Patient is a  63 y.o. female with PMH: poorlly controlled DM-1, chronic diastolic heart failure, esophageal dysphagia (has been followed by GI) who was admitted to [facility] on 05/11/24 with DKA in the setting of right sided pyelonephritis after presenting from home to [facility] ED c/o SOB. She recently underwent right ureteral stent placement and was discharged home on Foley catheter. She has had no urine output over the past few days. SLP swallow evaluation ordered secondary to her c/o pain and difficulty with swallowing. Type of Study: Bedside Swallow Evaluation Previous Swallow Assessment: none found Diet Prior to this Study: Regular;Thin liquids (Level 0) Temperature Spikes Noted: No Respiratory Status: Room air History of Recent Intubation: No Behavior/Cognition: Alert;Cooperative;Pleasant mood Oral Cavity Assessment: Within Functional Limits Oral Care Completed by SLP: No Oral Cavity - Dentition: Missing dentition Vision: Functional for self-feeding Self-Feeding Abilities: Able to feed self Patient Positioning: Upright in bed Baseline Vocal Quality: Normal Volitional Cough: Strong Volitional Swallow: Able to elicit    Oral/Motor/Sensory Function Overall Oral Motor/Sensory Function: Within functional limits   Ice Chips     Thin Liquid Thin Liquid: Within functional limits Presentation: Self Fed;Straw    Nectar Thick     Honey Thick     Puree     Solid     Solid: Impaired Presentation: Self Fed Pharyngeal Phase Impairments: Multiple swallows;Cough - Delayed     Norleen IVAR Blase, MA, CCC-SLP Speech Therapy

## 2024-05-14 NOTE — Progress Notes (Signed)
 PROGRESS NOTE Brooke [name]  FMW:[id number] DOB: Nov 29, 1960 DOA: 05/11/2024 PCP: [name], MD  Brief Narrative/Hospital Course: Brooke [name] is a 63 y.o. female with PMH of  poorly controlled type 1 diabetes, chronic diastolic heart failure presented in the ED with shortness of breath for 2 days associated with right flank pain, fever and increasing peripheral edema.She recently underwent right ureteral stent placement in the setting of hydronephrosis.  Patient was treated for UTI/pyelonephritis and,DKA. Seen by urology and infectious disease  Subjective: Seen and examined today Overnight afebrile, vitals stable blood glucose 120s, as low as 60s yesterday morning. Feels nauseous, no BM as has not been eating well, having flank pain urine still w/ sediments  Assessment and plan:  DKA Type 2 diabetes with uncontrolled hyperglycemia: A1c 10.17 recently.  PTA Jardiance Lantus  and SSI.DKA resolved with insulin  drip now on Lantus  20 units and SSI.Blood sugar remains well-controlled. DM coordinator following  Recent Labs  Lab 05/13/24 0852 05/13/24 1148 05/13/24 1609 05/13/24 2235 05/14/24 0730  GLUCAP 77 145* 157* 141* 122*    Pyelonephritis Recurrent UTI Hydrated right/ Recent ureteral stent which fell off: Seen by urology no need for stenting on right collecting system, ID input appreciated Treated with fluconazole .  On ceftriaxone .  Upon discharge Bactrim  DS x 10 days per ID  Acute on Chronic diastolic CHF: BNP 2255, chest x-ray shows bilateral small pleural effusions.  On IV Lasix .  Monitor intake output daily weight Holding metoprolol  and Entresto  as BP soft  Anemia of chronic disease Hemoglobin holding in 7 to 9 g.  Resume home iron  supplement.  Monitor Recent Labs  Lab 05/11/24 2128 05/12/24 0142 05/12/24 0607 05/13/24 0313 05/14/24 0801  HGB 9.5*  9.9* 9.2* 7.9* 7.9* 7.9*  HCT 28.0*  29.0* 27.0* 26.5* 26.8* 25.6*   PVD, s/p LE angioplasty in  December/24: CAD HLD: Resume home aspirin  Plavix  and statin since no plan for stent. Holding metoprolol   Esophageal dysphagia Noted.  DVT prophylaxis: SCDs Start: 05/12/24 0503 Code Status:   Code Status: Full Code Family Communication: plan of care discussed with patient at bedside. Patient status is: Remains hospitalized because of severity of illness Level of care: Stepdown   Dispo: The patient is from: home            Anticipated disposition: TBD Objective: Vitals last 24 hrs: Vitals:   05/14/24 0400 05/14/24 0500 05/14/24 0600 05/14/24 0730  BP: (!) 104/59 113/69    Pulse: 96 93 91   Resp: (!) 25 (!) 23 (!) 22   Temp: 98.9 F (37.2 C)   98.6 F (37 C)  TempSrc: Oral   Oral  SpO2: 91% 91% 94%   Weight:      Height:        Physical Examination: General exam: alert awake, oriented, sick looking, older than stated age HEENT:Oral mucosa moist, Ear/Nose WNL grossly Respiratory system: Bilaterally clear BS,no use of accessory muscle Cardiovascular system: S1 & S2 +, No JVD. Gastrointestinal system: Abdomen soft,NT,ND, BS+ Nervous System: Alert, awake, moving all extremities,and following commands. Extremities: extremities warm, leg edema + Skin: No rashes,no icterus. MSK: Normal muscle bulk,tone, power   Medications reviewed:  Scheduled Meds:  Chlorhexidine  Gluconate Cloth  6 each Topical Daily   feeding supplement  237 mL Oral BID BM   furosemide   40 mg Intravenous Daily   insulin  aspart  0-9 Units Subcutaneous TID WC   insulin  glargine  20 Units Subcutaneous Daily   Continuous Infusions:  cefTRIAXone  (ROCEPHIN )  IV Stopped (  05/13/24 1134)   insulin  Stopped (05/12/24 1755)   Diet: Diet Order             Diet Carb Modified Fluid consistency: Thin; Room service appropriate? Yes  Diet effective now                    Data Reviewed: I have personally reviewed following labs and imaging studies ( see epic result tab) CBC: Recent Labs  Lab 05/11/24 2100  05/11/24 2128 05/12/24 0142 05/12/24 0607 05/13/24 0313 05/14/24 0801  WBC 8.5  --   --  9.2 8.6 7.1  NEUTROABS 6.9  --   --  6.8  --   --   HGB 9.1* 9.5*  9.9* 9.2* 7.9* 7.9* 7.9*  HCT 28.8* 28.0*  29.0* 27.0* 26.5* 26.8* 25.6*  MCV 82.1  --   --  83.1 82.5 81.3  PLT 432*  --   --  358 345 295   CMP: Recent Labs  Lab 05/12/24 0607 05/12/24 1002 05/12/24 1412 05/12/24 1834 05/13/24 0313 05/14/24 0801  NA 140 136 137 138 137 140  K 3.5 4.4 4.4 4.0 3.9 4.2  CL 103 103 104 104 104 104  CO2 21* 21* 22 23 23 24   GLUCOSE 170* 125* 132* 91 81 118*  BUN 12 13 13 12 12 14   CREATININE 0.84 0.92 0.79 0.82 0.80 0.83  CALCIUM  8.5* 8.5* 8.3* 8.3* 8.6* 8.6*  MG 1.9  --   --   --  1.8  --   PHOS  --   --   --   --  2.5  --    GFR: Estimated Creatinine Clearance: 59.6 mL/min (by C-G formula based on SCr of 0.83 mg/dL). Recent Labs  Lab 05/11/24 2100 05/13/24 0313  AST 18 29  ALT 11 16  ALKPHOS 127* 250*  BILITOT 0.4 0.3  PROT 7.7 6.5  ALBUMIN 2.8* 3.1*   No results for input(s): LIPASE, AMYLASE in the last 168 hours. No results for input(s): AMMONIA in the last 168 hours. Coagulation Profile:  Recent Labs  Lab 05/12/24 0607  INR 1.3*   Unresulted Labs (From admission, onward)    None      Antimicrobials/Microbiology: Anti-infectives (From admission, onward)    Start     Dose/Rate Route Frequency Ordered Stop   05/12/24 1100  cefTRIAXone  (ROCEPHIN ) 2 g in sodium chloride  0.9 % 100 mL IVPB        2 g 200 mL/hr over 30 Minutes Intravenous Every 24 hours 05/12/24 1009     05/12/24 0800  ceFEPIme (MAXIPIME) 2 g in sodium chloride  0.9 % 100 mL IVPB  Status:  Discontinued        2 g 200 mL/hr over 30 Minutes Intravenous Every 8 hours 05/12/24 0551 05/12/24 1009   05/12/24 0200  fluconazole  (DIFLUCAN ) IVPB 200 mg        200 mg 100 mL/hr over 60 Minutes Intravenous  Once 05/12/24 0136 05/12/24 0329   05/12/24 0045  ceFEPIme (MAXIPIME) 2 g in sodium chloride  0.9 %  100 mL IVPB        2 g 200 mL/hr over 30 Minutes Intravenous  Once 05/12/24 0033 05/12/24 0145   05/11/24 2345  cefTRIAXone  (ROCEPHIN ) 2 g in sodium chloride  0.9 % 100 mL IVPB  Status:  Discontinued        2 g 200 mL/hr over 30 Minutes Intravenous  Once 05/11/24 2343 05/12/24 0032  Component Value Date/Time   SDES  05/12/2024 1346    URINE, CLEAN CATCH Performed at [facility], [address] [place], [place] [address]    SPECREQUEST  05/12/2024 1346    NONE Performed at [facility], [address], [place], [place] [address]    CULT >=100,000 COLONIES/mL YEAST (A) 05/12/2024 1346   REPTSTATUS 05/13/2024 FINAL 05/12/2024 1346    Procedures:    Mennie LAMY, MD [facility] 05/14/2024, 9:38 AM

## 2024-05-14 NOTE — Hospital Course (Addendum)
 Brooke [name] is a 63 y.o. female with PMH of  poorly controlled type 1 diabetes, chronic diastolic heart failure presented in the ED with shortness of breath for 2 days associated with right flank pain, fever and increasing peripheral edema.She recently underwent right ureteral stent placement in the setting of hydronephrosis.  Patient was treated for UTI/pyelonephritis and,DKA. Seen by urology and infectious disease  Subjective: Seen and examined today Reports she was doing well until this morning having nausea again. Requesting 1 more day to make sure she is tolerating diet Overnight afebrile BP stable blood sugar in 80s - 190s  Assessment and plan:  DKA Type 2 diabetes with uncontrolled hyperglycemia: A1c 10.17 recently.  PTA Jardiance Lantus  and SSI.DKA resolved with insulin  drip Not able to take full dose insulin  as repeated has been very poor. continue Lantus  3 units while poor intake.Cont  SSI. she will eventually need to go up on Lantus  based upon her oral intake. 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  Continue ceftriaxone  while inpatient and plan for Bactrim  DS x 10 days per ID  on discharge.  Urology plans to follow-up outpatient-advised to keep Foley catheter in place.  [facility] is [facility] for [facility] teaching  Acute on Chronic diastolic CHF: BNP 2255, chest x-ray shows bilateral small pleural effusions.Holding metoprolol  and Entresto  as BP soft.  On IV Lasix  will change to p.o. today.  Resume metoprolol .  Will consider resuming Entresto  on discharge.  Anemia of chronic  disease Hemoglobin holding in 7 to 9 g.  Cont home iron  supplement. Recent Labs  Lab 05/11/24 2128 05/12/24 0142 05/12/24 0607 05/13/24 0313 05/14/24 0801  HGB 9.5*  9.9* 9.2* 7.9* 7.9* 7.9*  HCT 28.0*  29.0* 27.0* 26.5* 26.8* 25.6*   PVD, s/p LE angioplasty in December/24: CAD HLD: No chest pain.  Stable, continue home aspirin  Plavix  and statin since no plan for stent. Holding metoprolol   Esophageal dysphagia/moderate dysmotility -barium swallow in 2023  Nausea Last EGD 10/22/2022, last colonoscopy 10/22/2022 polyps removed and had internal hemorrhoids-she has  had esophageal dilatation done  but states did not help much, has tried different class of PPI but not tolerating well.  Continue antiemetics.  She will need close follow-up with GI.  On carb modified diet Does not have much appetite to eat at times.   DVT prophylaxis: SCDs Start: 05/12/24 0503 Code Status:   Code Status: Full Code Family Communication: plan of care discussed with patient at bedside. Patient status is: Remains hospitalized because of severity of illness Level of care: Telemetry   Dispo: The patient is from: home            Anticipated disposition: Home tomorrow if tolerating diet  Objective: Vitals last 24 hrs: Vitals:   05/15/24 0830 05/15/24 2000 05/16/24 0551 05/16/24 1116  BP: 90/66 110/76 110/78 102/73  Pulse: 86 88 90 90  Resp: 18 (!) 24 18 19   Temp: 98.7 F (37.1 C) 98.7 F (37.1 C) 98.3 F (36.8 C) 98.3 F (36.8 C)  TempSrc: Oral Oral Oral Oral  SpO2: 95% 95% (!) 84% 95%  Weight:      Height:  Physical Examination: General exam:AAOX3, weak frail HEENT:Oral mucosa moist, Ear/Nose WNL grossly Respiratory system: Bilaterally clear BS,no use of accessory muscle Cardiovascular system: S1 & S2 +, No JVD. Gastrointestinal system: Abdomen soft,NT,ND, BS+ Nervous System: Alert, awake, moving all extremities,and following commands. Extremities:Extremities warm, leg edema + Skin: No  rashes,no icterus. MSK: Normal muscle bulk,tone, power   Medications reviewed:  Scheduled Meds:  aspirin   81 mg Oral QHS   atorvastatin   80 mg Oral QHS   Chlorhexidine  Gluconate Cloth  6 each Topical Daily   clopidogrel   75 mg Oral Daily   feeding supplement  237 mL Oral BID BM   ferrous sulfate   325 mg Oral Daily   [START ON 05/17/2024] furosemide   20 mg Oral Daily   insulin  aspart  0-9 Units Subcutaneous TID WC   insulin  glargine  3 Units Subcutaneous Daily   metoprolol  succinate  12.5 mg Oral Daily   Continuous Infusions:  cefTRIAXone  (ROCEPHIN )  IV 2 g (05/16/24 1056)   promethazine (PHENERGAN) injection (IM or IVPB)     Diet: Diet Order             Diet Carb Modified Fluid consistency: Thin; Room service appropriate? Yes  Diet effective now

## 2024-05-15 ENCOUNTER — Encounter (HOSPITAL_COMMUNITY): Payer: Self-pay | Admitting: Family Medicine

## 2024-05-15 DIAGNOSIS — E1010 Type 1 diabetes mellitus with ketoacidosis without coma: Secondary | ICD-10-CM | POA: Diagnosis not present

## 2024-05-15 LAB — BASIC METABOLIC PANEL WITH GFR
Anion gap: 12 (ref 5–15)
BUN: 15 mg/dL (ref 8–23)
CO2: 23 mmol/L (ref 22–32)
Calcium: 8.9 mg/dL (ref 8.9–10.3)
Chloride: 101 mmol/L (ref 98–111)
Creatinine, Ser: 0.91 mg/dL (ref 0.44–1.00)
GFR, Estimated: >60 mL/min (ref >60)
Glucose, Bld: 211 mg/dL — ABNORMAL HIGH (ref 70–99)
Potassium: 4.3 mmol/L (ref 3.5–5.1)
Sodium: 136 mmol/L (ref 135–145)

## 2024-05-15 LAB — GLUCOSE, CAPILLARY
Glucose-Capillary: 196 mg/dL — ABNORMAL HIGH (ref 70–99)
Glucose-Capillary: 129 mg/dL — ABNORMAL HIGH (ref 70–99)
Glucose-Capillary: 92 mg/dL (ref 70–99)
Glucose-Capillary: 93 mg/dL (ref 70–99)

## 2024-05-15 NOTE — Progress Notes (Signed)
 PROGRESS NOTE Brooke [name]  FMW:[id number] DOB: July 25, 1961 DOA: 05/11/2024 PCP: [name], MD  Brief Narrative/Hospital Course: Brooke [name] is a 63 y.o. female with PMH of  poorly controlled type 1 diabetes, chronic diastolic heart failure presented in the ED with shortness of breath for 2 days associated with right flank pain, fever and increasing peripheral edema.She recently underwent right ureteral stent placement in the setting of hydronephrosis.  Patient was treated for UTI/pyelonephritis and,DKA. Seen by urology and infectious disease  Subjective: Seen and examined today Complains of some nausea,overall not feeling very well She had lots of nausea yesterday, has no appetite and not eating well and only getting small dose of lantus . Overnight afebrile BP stable Labs showed blood sugar in 190s  Assessment and plan:  DKA Type 2 diabetes with uncontrolled hyperglycemia: A1c 10.17 recently.  PTA Jardiance Lantus  and SSI.DKA resolved with insulin  drip Not able to take full dose insulin  as repeated has been very poor continue Lantus  3 units while poor intake.Cont  SSI  Recent Labs  Lab 05/14/24 0730 05/14/24 1142 05/14/24 1630 05/14/24 2035 05/15/24 0711  GLUCAP 122* 99 104* 170* 196*    Pyelonephritis Recurrent UTI Hydrated right/ Recent ureteral stent which fell off: Seen by urology no need for stenting on right collecting system, ID input appreciated Treated with fluconazole , prior hx of candida colonization.  Continue ceftriaxone  while inpatient and plan for Bactrim  DS x 10 days per ID ON discharge.  Acute on Chronic diastolic CHF: BNP 2255, chest x-ray shows bilateral small pleural effusions.  On IV Lasix  Monitor intake output daily weight Holding metoprolol  and Entresto  as BP soft  Anemia of chronic disease Hemoglobin holding in 7 to 9 g.  Resume home iron  supplement.  Monitor Recent Labs  Lab 05/11/24 2128 05/12/24 0142 05/12/24 0607 05/13/24 0313  05/14/24 0801  HGB 9.5*  9.9* 9.2* 7.9* 7.9* 7.9*  HCT 28.0*  29.0* 27.0* 26.5* 26.8* 25.6*   PVD, s/p LE angioplasty in December/24: CAD HLD: No chest pain.  Stable, continue home aspirin  Plavix  and statin since no plan for stent. Holding metoprolol   Esophageal dysphagia Noted.  DVT prophylaxis: SCDs Start: 05/12/24 0503 Code Status:   Code Status: Full Code Family Communication: plan of care discussed with patient at bedside. Patient status is: Remains hospitalized because of severity of illness Level of care: Telemetry   Dispo: The patient is from: home            Anticipated disposition: Home in 1 to 2 days Objective: Vitals last 24 hrs: Vitals:   05/14/24 2009 05/15/24 0539 05/15/24 0641 05/15/24 0830  BP: 113/76 106/71  90/66  Pulse: 96 92  86  Resp: 15 18  18   Temp: 98.3 F (36.8 C) 97.9 F (36.6 C)  98.7 F (37.1 C)  TempSrc: Oral   Oral  SpO2: 97% 91%  95%  Weight: 79.8 kg  79.9 kg   Height: 5' 4.5 (1.638 m)       Physical Examination: General exam:AAOX3 ,weak frail HEENT:Oral mucosa moist, Ear/Nose WNL grossly Respiratory system: Bilaterally clear BS,no use of accessory muscle Cardiovascular system: S1 & S2 +, No JVD. Gastrointestinal system: Abdomen soft,NT,ND, BS+ Nervous System: Alert, awake, moving all extremities,and following commands. Extremities: extremities warm, leg edema + Skin: No rashes,no icterus. MSK: Normal muscle bulk,tone, power   Medications reviewed:  Scheduled Meds:  aspirin   81 mg Oral QHS   atorvastatin   80 mg Oral QHS   Chlorhexidine  Gluconate Cloth  6 each Topical  Daily   clopidogrel   75 mg Oral Daily   feeding supplement  237 mL Oral BID BM   ferrous sulfate   325 mg Oral Daily   furosemide   40 mg Intravenous Daily   insulin  aspart  0-9 Units Subcutaneous TID WC   insulin  glargine  3 Units Subcutaneous Daily   Continuous Infusions:  cefTRIAXone  (ROCEPHIN )  IV Stopped (05/14/24 1108)   promethazine (PHENERGAN)  injection (IM or IVPB)     Diet: Diet Order             Diet Carb Modified Fluid consistency: Thin; Room service appropriate? Yes  Diet effective now                    Data Reviewed: I have personally reviewed following labs and imaging studies ( see epic result tab) CBC: Recent Labs  Lab 05/11/24 2100 05/11/24 2128 05/12/24 0142 05/12/24 0607 05/13/24 0313 05/14/24 0801  WBC 8.5  --   --  9.2 8.6 7.1  NEUTROABS 6.9  --   --  6.8  --   --   HGB 9.1* 9.5*  9.9* 9.2* 7.9* 7.9* 7.9*  HCT 28.8* 28.0*  29.0* 27.0* 26.5* 26.8* 25.6*  MCV 82.1  --   --  83.1 82.5 81.3  PLT 432*  --   --  358 345 295   CMP: Recent Labs  Lab 05/12/24 0607 05/12/24 1002 05/12/24 1412 05/12/24 1834 05/13/24 0313 05/14/24 0801 05/15/24 0509  NA 140   < > 137 138 137 140 136  K 3.5   < > 4.4 4.0 3.9 4.2 4.3  CL 103   < > 104 104 104 104 101  CO2 21*   < > 22 23 23 24 23   GLUCOSE 170*   < > 132* 91 81 118* 211*  BUN 12   < > 13 12 12 14 15   CREATININE 0.84   < > 0.79 0.82 0.80 0.83 0.91  CALCIUM  8.5*   < > 8.3* 8.3* 8.6* 8.6* 8.9  MG 1.9  --   --   --  1.8  --   --   PHOS  --   --   --   --  2.5  --   --    < > = values in this interval not displayed.   GFR: Estimated Creatinine Clearance: 67.1 mL/min (by C-G formula based on SCr of 0.91 mg/dL). Recent Labs  Lab 05/11/24 2100 05/13/24 0313  AST 18 29  ALT 11 16  ALKPHOS 127* 250*  BILITOT 0.4 0.3  PROT 7.7 6.5  ALBUMIN 2.8* 3.1*   No results for input(s): LIPASE, AMYLASE in the last 168 hours. No results for input(s): AMMONIA in the last 168 hours. Coagulation Profile:  Recent Labs  Lab 05/12/24 0607  INR 1.3*   Unresulted Labs (From admission, onward)    None      Antimicrobials/Microbiology: Anti-infectives (From admission, onward)    Start     Dose/Rate Route Frequency Ordered Stop   05/12/24 1100  cefTRIAXone  (ROCEPHIN ) 2 g in sodium chloride  0.9 % 100 mL IVPB        2 g 200 mL/hr over 30 Minutes  Intravenous Every 24 hours 05/12/24 1009     05/12/24 0800  ceFEPIme (MAXIPIME) 2 g in sodium chloride  0.9 % 100 mL IVPB  Status:  Discontinued        2 g 200 mL/hr over 30 Minutes Intravenous Every 8 hours 05/12/24 0551  05/12/24 1009   05/12/24 0200  fluconazole  (DIFLUCAN ) IVPB 200 mg        200 mg 100 mL/hr over 60 Minutes Intravenous  Once 05/12/24 0136 05/12/24 0329   05/12/24 0045  ceFEPIme (MAXIPIME) 2 g in sodium chloride  0.9 % 100 mL IVPB        2 g 200 mL/hr over 30 Minutes Intravenous  Once 05/12/24 0033 05/12/24 0145   05/11/24 2345  cefTRIAXone  (ROCEPHIN ) 2 g in sodium chloride  0.9 % 100 mL IVPB  Status:  Discontinued        2 g 200 mL/hr over 30 Minutes Intravenous  Once 05/11/24 2343 05/12/24 0032         Component Value Date/Time   SDES  05/12/2024 1346    URINE, CLEAN CATCH Performed at [facility], [address] [place], [place] [address]    SPECREQUEST  05/12/2024 1346    NONE Performed at [facility], [address], [place], [place] [address]    CULT >=100,000 COLONIES/mL YEAST (A) 05/12/2024 1346   REPTSTATUS 05/13/2024 FINAL 05/12/2024 1346    Procedures:    Mennie LAMY, MD [facility] 05/15/2024, 10:39 AM

## 2024-05-15 NOTE — Progress Notes (Signed)
 I attest to student documentation.  Cherye Gaertner, MSN-RN Nursing Faculty/Clinical Instructor [facility]

## 2024-05-15 NOTE — Plan of Care (Signed)
   Problem: Education: Goal: Ability to describe self-care measures that may prevent or decrease complications (Diabetes Survival Skills Education) will improve Outcome: Progressing   Problem: Metabolic: Goal: Ability to maintain appropriate glucose levels will improve Outcome: Progressing   Problem: Nutritional: Goal: Maintenance of adequate nutrition will improve Outcome: Progressing

## 2024-05-15 NOTE — Plan of Care (Signed)

## 2024-05-15 NOTE — Progress Notes (Addendum)
     Subjective: First time meeting Brooke [name].  Briefly reviewed the plan regarding outpatient [facility].  She expressed understanding and had no further questions.  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  No surgical indication at this time.  Plan to discharge from urologic perspective.  Urology will sign off at this time.  Please call with questions.  Intake/Output from previous day: 10/05 0701 - 10/06 0700 In: 317 [P.O.:120; IV Piggyback:197] Out: 2650 [Urine:2650]  Intake/Output this shift: No intake/output data recorded.  Physical Exam:  General: Alert and oriented CV: No cyanosis Lungs: equal chest rise Abdomen: Soft, NTND, no rebound or guarding Gu: Catheter in place draining clear yellow urine  Lab Results: Recent Labs    05/13/24 0313 05/14/24 0801  HGB 7.9* 7.9*  HCT 26.8* 25.6*   BMET Recent Labs    05/13/24 0313 05/14/24 0801 05/15/24 0509  NA 137 140 136  K 3.9 4.2 4.3  CL 104 104 101  CO2 23 24 23   GLUCOSE 81 118* 211*  BUN 12 14 15   CREATININE 0.80 0.83 0.91  CALCIUM  8.6* 8.6* 8.9  HGB 7.9* 7.9*  --   WBC 8.6 7.1  --      Studies/Results: No results found.    LOS: 3 days   Brooke Bourdon, NP [facility] Pager: [phone]  05/15/2024, 11:58 AM   I have seen and examined the patient and agree with the above assessment and plan.  Pt without flank pain today. Still  having intermittent nausea. Foley draining well. Discussed would like to avoid stent placement given candiduria. Suspect hydro is chronic and reflux in nature. Reassuring normal creatinine. If suspicious for obstruction in the future, will order lasix  renal scan. Ok to discharge from urology standpoint with followup in clinic for [facility] teaching.  Brooke R. Gatlin Kittell MD [facility]  Pager: [phone]

## 2024-05-16 ENCOUNTER — Ambulatory Visit (HOSPITAL_COMMUNITY)

## 2024-05-16 ENCOUNTER — Ambulatory Visit

## 2024-05-16 DIAGNOSIS — E1010 Type 1 diabetes mellitus with ketoacidosis without coma: Secondary | ICD-10-CM | POA: Diagnosis not present

## 2024-05-16 LAB — GLUCOSE, CAPILLARY
Glucose-Capillary: 88 mg/dL (ref 70–99)
Glucose-Capillary: 116 mg/dL — ABNORMAL HIGH (ref 70–99)
Glucose-Capillary: 126 mg/dL — ABNORMAL HIGH (ref 70–99)
Glucose-Capillary: 172 mg/dL — ABNORMAL HIGH (ref 70–99)

## 2024-05-16 MED ORDER — FUROSEMIDE 20 MG PO TABS
20.0000 mg | ORAL_TABLET | Freq: Every day | ORAL | Status: DC
  Administered 2024-05-17: 20 mg via ORAL
  Filled 2024-05-16: qty 1

## 2024-05-16 MED ORDER — METOPROLOL SUCCINATE ER 25 MG PO TB24
12.5000 mg | ORAL_TABLET | Freq: Every day | ORAL | Status: DC
  Administered 2024-05-16 – 2024-05-17 (×2): 12.5 mg via ORAL
  Filled 2024-05-16 (×2): qty 1

## 2024-05-16 NOTE — Progress Notes (Signed)
 PROGRESS NOTE Brooke [name]  FMW:[id number] DOB: Nov 03, 1960 DOA: 05/11/2024 PCP: [name], MD  Brief Narrative/Hospital Course: Brooke [name] is a 63 y.o. female with PMH of  poorly controlled type 1 diabetes, chronic diastolic heart failure presented in the ED with shortness of breath for 2 days associated with right flank pain, fever and increasing peripheral edema.She recently underwent right ureteral stent placement in the setting of hydronephrosis.  Patient was treated for UTI/pyelonephritis and,DKA. Seen by urology and infectious disease  Subjective: Seen and examined today Reports she was doing well until this morning having nausea again. Requesting 1 more day to make sure she is tolerating diet Overnight afebrile BP stable blood sugar in 80s - 190s  Assessment and plan:  DKA Type 2 diabetes with uncontrolled hyperglycemia: A1c 10.17 recently.  PTA Jardiance Lantus  and SSI.DKA resolved with insulin  drip Not able to take full dose insulin  as repeated has been very poor. continue Lantus  3 units while poor intake.Cont  SSI. she will eventually need to go up on Lantus  based upon her oral intake. 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  Continue ceftriaxone  while inpatient and plan for Bactrim  DS x 10 days per ID  on discharge.  Urology plans to follow-up outpatient-advised to keep Foley catheter in place.  She is following up with [facility] next Wednesday for [facility] teaching  Acute on Chronic diastolic CHF: BNP 2255, chest x-ray shows bilateral small pleural effusions.Holding metoprolol  and Entresto  as BP soft.  On  IV Lasix  will change to p.o. today.  Resume metoprolol .  Will consider resuming Entresto  on discharge.  Anemia of chronic disease Hemoglobin holding in 7 to 9 g.  Cont home iron  supplement. Recent Labs  Lab 05/11/24 2128 05/12/24 0142 05/12/24 0607 05/13/24 0313 05/14/24 0801  HGB 9.5*  9.9* 9.2* 7.9* 7.9* 7.9*  HCT 28.0*  29.0* 27.0* 26.5* 26.8* 25.6*   PVD, s/p LE angioplasty in December/24: CAD HLD: No chest pain.  Stable, continue home aspirin  Plavix  and statin since no plan for stent. Holding metoprolol   Esophageal dysphagia/moderate dysmotility -barium swallow in 2023  Nausea Last EGD 10/22/2022, last colonoscopy 10/22/2022 polyps removed and had internal hemorrhoids-she has  had esophageal dilatation done  but states did not help much, has tried different class of PPI but not tolerating well.  Continue antiemetics.  She will need close follow-up with GI.  On carb modified diet Does not have much appetite to eat at times.   DVT prophylaxis: SCDs Start: 05/12/24 0503 Code Status:   Code Status: Full Code Family Communication: plan of care discussed with patient at bedside. Patient status is: Remains hospitalized because of severity of illness Level of care: Telemetry   Dispo: The patient is from: home            Anticipated disposition: Home tomorrow if tolerating diet  Objective: Vitals last 24 hrs: Vitals:   05/15/24 0830 05/15/24 2000 05/16/24 0551 05/16/24 1116  BP: 90/66 110/76 110/78 102/73  Pulse: 86 88 90 90  Resp: 18 (!) 24 18 19   Temp: 98.7 F (37.1 C) 98.7 F (37.1 C) 98.3 F (36.8 C) 98.3 F (36.8 C)  TempSrc: Oral Oral Oral Oral  SpO2:  95% 95% (!) 84% 95%  Weight:      Height:       Physical Examination: General exam:AAOX3, weak frail HEENT:Oral mucosa moist, Ear/Nose WNL grossly Respiratory system: Bilaterally clear BS,no use of accessory muscle Cardiovascular system: S1 & S2 +, No JVD. Gastrointestinal system: Abdomen soft,NT,ND, BS+ Nervous  System: Alert, awake, moving all extremities,and following commands. Extremities:Extremities warm, leg edema + Skin: No rashes,no icterus. MSK: Normal muscle bulk,tone, power   Medications reviewed:  Scheduled Meds:  aspirin   81 mg Oral QHS   atorvastatin   80 mg Oral QHS   Chlorhexidine  Gluconate Cloth  6 each Topical Daily   clopidogrel   75 mg Oral Daily   feeding supplement  237 mL Oral BID BM   ferrous sulfate   325 mg Oral Daily   [START ON 05/17/2024] furosemide   20 mg Oral Daily   insulin  aspart  0-9 Units Subcutaneous TID WC   insulin  glargine  3 Units Subcutaneous Daily   metoprolol  succinate  12.5 mg Oral Daily   Continuous Infusions:  cefTRIAXone  (ROCEPHIN )  IV 2 g (05/16/24 1056)   promethazine (PHENERGAN) injection (IM or IVPB)     Diet: Diet Order             Diet Carb Modified Fluid consistency: Thin; Room service appropriate? Yes  Diet effective now                    Data Reviewed: I have personally reviewed following labs and imaging studies ( see epic result tab) CBC: Recent Labs  Lab 05/11/24 2100 05/11/24 2128 05/12/24 0142 05/12/24 0607 05/13/24 0313 05/14/24 0801  WBC 8.5  --   --  9.2 8.6 7.1  NEUTROABS 6.9  --   --  6.8  --   --   HGB 9.1* 9.5*  9.9* 9.2* 7.9* 7.9* 7.9*  HCT 28.8* 28.0*  29.0* 27.0* 26.5* 26.8* 25.6*  MCV 82.1  --   --  83.1 82.5 81.3  PLT 432*  --   --  358 345 295   CMP: Recent Labs  Lab 05/12/24 0607 05/12/24 1002 05/12/24 1412 05/12/24 1834 05/13/24 0313 05/14/24 0801 05/15/24 0509  NA 140   < > 137 138 137 140 136  K 3.5   < > 4.4 4.0 3.9 4.2 4.3  CL 103   < > 104 104 104 104 101  CO2 21*   < > 22 23 23 24 23   GLUCOSE 170*   < > 132* 91 81 118* 211*  BUN 12   < > 13 12 12 14 15   CREATININE 0.84   < > 0.79 0.82 0.80 0.83 0.91  CALCIUM  8.5*   < > 8.3* 8.3* 8.6* 8.6* 8.9  MG 1.9  --   --   --  1.8  --   --   PHOS  --   --   --   --  2.5  --   --    < > = values in this interval not displayed.   GFR:  Estimated Creatinine Clearance: 67.1 mL/min (by C-G formula based on SCr of 0.91 mg/dL). Recent Labs  Lab 05/11/24 2100 05/13/24 0313  AST 18 29  ALT 11 16  ALKPHOS 127* 250*  BILITOT 0.4 0.3  PROT 7.7 6.5  ALBUMIN 2.8* 3.1*   No results for input(s): LIPASE, AMYLASE in the last 168 hours. No results for input(s): AMMONIA in the last 168 hours. Coagulation Profile:  Recent Labs  Lab 05/12/24 0607  INR 1.3*   Unresulted Labs (From admission, onward)    None      Antimicrobials/Microbiology: Anti-infectives (From admission, onward)    Start     Dose/Rate Route Frequency Ordered Stop   05/12/24 1100  cefTRIAXone  (ROCEPHIN ) 2 g in sodium chloride  0.9 % 100 mL IVPB        2 g 200 mL/hr over 30 Minutes Intravenous Every 24 hours 05/12/24 1009     05/12/24 0800  ceFEPIme (MAXIPIME) 2 g in sodium chloride  0.9 % 100 mL IVPB  Status:  Discontinued        2 g 200 mL/hr over 30 Minutes Intravenous Every 8 hours 05/12/24 0551 05/12/24 1009   05/12/24 0200  fluconazole  (DIFLUCAN ) IVPB 200 mg        200 mg 100 mL/hr over 60 Minutes Intravenous  Once 05/12/24 0136 05/12/24 0329   05/12/24 0045  ceFEPIme (MAXIPIME) 2 g in sodium chloride  0.9 % 100 mL IVPB        2 g 200 mL/hr over 30 Minutes Intravenous  Once 05/12/24 0033 05/12/24 0145   05/11/24 2345  cefTRIAXone  (ROCEPHIN ) 2 g in sodium chloride  0.9 % 100 mL IVPB  Status:  Discontinued        2 g 200 mL/hr over 30 Minutes Intravenous  Once 05/11/24 2343 05/12/24 0032         Component Value Date/Time   SDES  05/12/2024 1346    URINE, CLEAN CATCH Performed at [facility] [address] [place], [place] [address]    SPECREQUEST  05/12/2024 1346    NONE Performed at [facility], [address], [place], [place] [address]    CULT >=100,000 COLONIES/mL YEAST (A) 05/12/2024 1346   REPTSTATUS 05/13/2024 FINAL 05/12/2024 1346    Procedures:   Mennie LAMY, MD [facility] 05/16/2024, 11:18 AM

## 2024-05-16 NOTE — Plan of Care (Signed)

## 2024-05-16 NOTE — Progress Notes (Signed)
   05/16/24 1147  TOC Brief Assessment  Insurance and Status Reviewed  Patient has primary care physician Yes  Home environment has been reviewed single family home  Prior level of function: independent  Prior/Current Home Services No current home services  Social Drivers of Health Review SDOH reviewed no interventions necessary  Readmission risk has been reviewed Yes  Transition of care needs transition of care needs identified, TOC will continue to follow    Pt has chronic foley catheter. TOC will continue to follow for needs.  Signed: Heather Saltness, MSW, LCSW Clinical Social Worker Inpatient Care Management 05/16/2024 11:48 AM

## 2024-05-17 ENCOUNTER — Other Ambulatory Visit (HOSPITAL_COMMUNITY): Payer: Self-pay

## 2024-05-17 ENCOUNTER — Other Ambulatory Visit: Payer: Self-pay

## 2024-05-17 DIAGNOSIS — E0810 Diabetes mellitus due to underlying condition with ketoacidosis without coma: Secondary | ICD-10-CM

## 2024-05-17 LAB — CULTURE, BLOOD (ROUTINE X 2)
Culture: NO GROWTH
Culture: NO GROWTH

## 2024-05-17 LAB — GLUCOSE, CAPILLARY
Glucose-Capillary: 114 mg/dL — ABNORMAL HIGH (ref 70–99)
Glucose-Capillary: 131 mg/dL — ABNORMAL HIGH (ref 70–99)

## 2024-05-17 MED ORDER — HYDROCODONE-ACETAMINOPHEN 5-325 MG PO TABS
1.0000 | ORAL_TABLET | Freq: Three times a day (TID) | ORAL | 0 refills | Status: AC | PRN
  Filled 2024-05-17: qty 9, 3d supply, fill #0

## 2024-05-17 MED ORDER — SULFAMETHOXAZOLE-TRIMETHOPRIM 800-160 MG PO TABS
1.0000 | ORAL_TABLET | Freq: Two times a day (BID) | ORAL | 0 refills | Status: AC
Start: 2024-05-18 — End: 2024-05-22
  Filled 2024-05-17: qty 8, 4d supply, fill #0

## 2024-05-17 MED ORDER — FUROSEMIDE 20 MG PO TABS
20.0000 mg | ORAL_TABLET | Freq: Every day | ORAL | 0 refills | Status: DC
  Filled 2024-05-17: qty 30, 30d supply, fill #0

## 2024-05-17 NOTE — Progress Notes (Signed)
 Discharge meds in  a secure bag  delivered to charge nurse Royden) who is doing the AVS teaching

## 2024-05-17 NOTE — Plan of Care (Signed)
  Problem: Education: Goal: Ability to describe self-care measures that may prevent or decrease complications (Diabetes Survival Skills Education) will improve Outcome: Progressing   Problem: Nutritional: Goal: Maintenance of adequate nutrition will improve Outcome: Progressing   Problem: Metabolic: Goal: Ability to maintain appropriate glucose levels will improve Outcome: Progressing   Problem: Health Behavior/Discharge Planning: Goal: Ability to identify and utilize available resources and services will improve Outcome: Progressing

## 2024-05-17 NOTE — Plan of Care (Signed)

## 2024-05-17 NOTE — Discharge Summary (Signed)
 Physician Discharge Summary  Brooke [name] FMW:[id number] DOB: [birth year] DOA: 05/11/2024  PCP: [name], MD  Admit date: 05/11/2024 Discharge date: 05/17/2024  Admitted From: Home Disposition: Home  Recommendations for Outpatient Follow-up:  Follow up with PCP in 1-2 weeks Urology follow-up as scheduled Continue catheter care until then  Home Health: N/A Equipment/Devices: N/A  Discharge Condition: Stable CODE STATUS: Full code Diet recommendation: Low-carb diet  Discharge summary: 63 year old with poorly controlled type 2 diabetes, chronic diastolic heart failure presented to the ER with 2 days of shortness of breath, associated right flank pain, fever and peripheral edema.  She was found to have right-sided hydronephrosis and pyelonephritis.  Patient was also found to have DKA on admission.  Treated for following conditions.  Type 2 diabetes with uncontrolled hyperglycemia, DKA: Hemoglobin A1c 10.1.  Patient on Jardiance, Lantus .  Presented with anion gap metabolic acidosis.  Treated with insulin  infusion and then subsequently with subcu insulin .  She still has poor appetite.  Patient will resume home dose of insulin , however she will take half dose of long-acting insulin  until her appetite is improved and eating regularly.  Well-controlled today on discharge.  Pyelonephritis, recurrent UTI with right-sided hydronephrosis, recent ureteral stent that fell off. Atonic bladder with chronic catheter in place.  UTI secondary to indwelling catheter.  Urine cultures with Candida.  1 dose of Diflucan .  Foley catheter exchanged.  Seen by urology.  Recommended 10 days of antibiotic therapy.  Patient has received day 6 of ceftriaxone  today.  Bactrim  for additional 4 days.  Going home with Foley catheter and Foley care.  Urology to schedule follow-up to teach intermittent self-catheterization.  Acute on chronic diastolic congestive heart failure: Chest x-ray with bilateral small pleural  effusions.  BNP was 2255.  Since her blood pressure has improved, she will go back on metoprolol  and Entresto .  Her diuretics were on hold.  Will start on oral Lasix .  She will follow-up with cardiology.  Anemia of chronic disease: Hemoglobin low but is stable.  On iron  supplementation.  Peripheral arterial disease status post left lower extremity angioplasty, coronary artery disease, hyperlipidemia: Currently stable.  On aspirin  Plavix  and statin.  Medically stabilized to go home today.     Discharge Diagnoses:  Principal Problem:   DKA (diabetic ketoacidosis) (HCC) Active Problems:   Hypokalemia   Pyelonephritis   Chronic diastolic CHF (congestive heart failure) (HCC)   Anemia of chronic disease   Hydroureter on right    Discharge Instructions  Discharge Instructions     Diet - low sodium heart healthy   Complete by: As directed    Diet Carb Modified   Complete by: As directed    Increase activity slowly   Complete by: As directed    No wound care   Complete by: As directed       Allergies as of 05/17/2024       Reactions   Bayer Aspirin  [aspirin ] Other (See Comments)   Stomach pain occurs with 325mg  dose, able to tolerate 81mg .   Darvon-n [propoxyphene] Rash, Other (See Comments)   Darvocet    Ery-tab [erythromycin] Nausea And Vomiting   Glucophage  [metformin ] Diarrhea, Nausea Only, Other (See Comments)   General GI intolerance    Motrin [ibuprofen] Other (See Comments)   Stomach pain   Neurontin [gabapentin] Other (See Comments)   Suicidal ideations         Medication List     STOP taking these medications    cephALEXin  500 MG capsule Commonly  known as: KEFLEX    fluconazole  200 MG tablet Commonly known as: DIFLUCAN        TAKE these medications    acetaminophen  500 MG tablet Commonly known as: TYLENOL  Take 1,000 mg by mouth every 6 (six) hours as needed for headache or fever (pain).   aspirin  81 MG chewable tablet Chew 1 tablet (81 mg  total) by mouth daily. What changed: when to take this   atorvastatin  80 MG tablet Commonly known as: LIPITOR  Take 1 tablet (80 mg total) by mouth daily. What changed: when to take this   BLOOD GLUCOSE TEST STRIPS Strp 1 each by Does not apply route 3 (three) times daily. Use as directed to check blood sugar. May dispense any manufacturer covered by patient's insurance and fits patient's device.   clopidogrel  75 MG tablet Commonly known as: PLAVIX  Take 1 tablet (75 mg total) by mouth daily.   cyanocobalamin 1000 MCG tablet Commonly known as: VITAMIN B12 Take 3,000 mcg by mouth daily.   Dexcom G7 Receiver Devi Apply as directed.   Entresto  49-51 MG Generic drug: sacubitril -valsartan  Take 1 tablet by mouth 2 (two) times daily.   FeroSul 325 (65 Fe) MG tablet Generic drug: ferrous sulfate  Take 325 mg by mouth daily.   furosemide  20 MG tablet Commonly known as: LASIX  Take 1 tablet (20 mg total) by mouth daily. Start taking on: May 18, 2024   HYDROcodone -acetaminophen  5-325 MG tablet Commonly known as: NORCO/VICODIN Take 1 tablet by mouth every 8 (eight) hours as needed for up to 3 days for moderate pain (pain score 4-6).   insulin  glargine 100 UNIT/ML Solostar Pen Commonly known as: LANTUS  Inject 20 Units into the skin daily. What changed:  how much to take when to take this additional instructions   Lancet Device Misc 1 each by Does not apply route 3 (three) times daily. May dispense any manufacturer covered by patient's insurance.   metoprolol  succinate 25 MG 24 hr tablet Commonly known as: TOPROL -XL Take 0.5 tablets (12.5 mg total) by mouth daily. Take with or immediately following a meal.   NovoLOG  FlexPen 100 UNIT/ML FlexPen Generic drug: insulin  aspart Inject 3-14 Units into the skin 3 (three) times daily with meals. If eating and Blood Glucose (BG) 80 or higher inject 3 units for meal coverage and add correction dose per scale. If not eating, correction  dose only. BG <150= 0 unit; BG 150-200= 1 unit; BG 201-250= 3 unit; BG 251-300= 5 unit; BG 301-350= 7 unit; BG 351-400= 9 unit; BG >400= 11 unit and Call Primary Care.   Pen Needles 31G X 5 MM Misc Use 3 times a day.   sulfamethoxazole -trimethoprim  800-160 MG tablet Commonly known as: Bactrim  DS Take 1 tablet by mouth 2 (two) times daily for 4 days. Start taking on: May 18, 2024        Follow-up Information     Selma Donnice SAUNDERS, MD. Schedule an appointment as soon as possible for a visit in 1 week.   Specialty: Urology Contact information: [address] [place] [address] [phone]                Allergies  Allergen Reactions   Bayer Aspirin  [Aspirin ] Other (See Comments)    Stomach pain occurs with 325mg  dose, able to tolerate 81mg .   Darvon-N [Propoxyphene] Rash and Other (See Comments)    Darvocet    Ery-Tab [Erythromycin] Nausea And Vomiting   Glucophage  [Metformin ] Diarrhea, Nausea Only and Other (See Comments)  General GI intolerance    Motrin [Ibuprofen] Other (See Comments)    Stomach pain   Neurontin [Gabapentin] Other (See Comments)    Suicidal ideations     Consultations: Urology Infectious disease   Procedures/Studies: DG CHEST PORT 1 VIEW Result Date: 05/13/2024 CLINICAL DATA:  Shortness of breath. EXAM: PORTABLE CHEST 1 VIEW COMPARISON:  05/11/2024 FINDINGS: Interval progression of bibasilar atelectasis/infiltrate with persistent small bilateral pleural effusions. No overt pulmonary edema. The cardio pericardial silhouette is enlarged. Telemetry leads overlie the chest. IMPRESSION: Interval progression of bibasilar atelectasis/infiltrate with persistent small bilateral pleural effusions. Electronically Signed   By: Camellia Candle M.D.   On: 05/13/2024 08:11   CT ABDOMEN PELVIS W CONTRAST Result Date: 05/11/2024 CLINICAL DATA:  Left lower quadrant pain EXAM: CT ABDOMEN AND PELVIS WITH CONTRAST TECHNIQUE: Multidetector CT imaging of the  abdomen and pelvis was performed using the standard protocol following bolus administration of intravenous contrast. RADIATION DOSE REDUCTION: This exam was performed according to the departmental dose-optimization program which includes automated exposure control, adjustment of the mA and/or kV according to patient size and/or use of iterative reconstruction technique. CONTRAST:  75mL OMNIPAQUE  IOHEXOL  350 MG/ML SOLN COMPARISON:  CT 03/02/2024 FINDINGS: Lower chest: Lung bases demonstrate interval small moderate bilateral pleural effusions. Septal thickening at the right middle lobe lingula and lung bases. Patchy ground-glass disease in the lower lobes. Hepatobiliary: No focal liver abnormality is seen. No gallstones, gallbladder wall thickening, or biliary dilatation. Pancreas: Unremarkable. No pancreatic ductal dilatation or surrounding inflammatory changes. Spleen: Normal in size without focal abnormality. Adrenals/Urinary Tract: Adrenal glands are normal. Interval placement of right-sided ureteral stent with proximal pigtail in the right renal collecting system and distal pigtail in the bladder. Persistent mild dilatation of the right renal pelvis and marked hydroureter with diffuse urothelial thickening of renal pelvis and ureter. Gas bubbles within the dilated ureter. Gas within the urinary bladder. Urinary bladder appears distended. A Foley catheter is present in the bladder. Delayed views demonstrate striated nephrogram on the right with patchy areas of cortical hypoenhancement. Poor excretion of contrast from the right greater than left kidneys on delayed views. Stomach/Bowel: Stomach within normal limits. No dilated small bowel. No acute bowel wall thickening. Vascular/Lymphatic: Aortic atherosclerosis. No aneurysm. Circumaortic left renal vein. Multiple subcentimeter retroperitoneal nodes. Reproductive: Uterus and bilateral adnexa are unremarkable. Other: Negative for ascites. No free air. Generalized  subcutaneous edema. Musculoskeletal: No acute or suspicious osseous abnormality. IMPRESSION: 1. Interval placement of right-sided ureteral stent with proximal pigtail in the right renal collecting system and distal pigtail in the bladder. Persistent mild dilatation of the right renal pelvis and marked hydroureter with diffuse urothelial thickening of the renal pelvis and ureter. Gas bubbles within the dilated ureter and urinary bladder, presumably related to recent instrumentation. Patchy hypoenhancement of the right renal cortex on delayed views suspicious for pyelonephritis. 2. Prominent bladder distension with Foley catheter in place, correlate for Foley function. 3. Interval small to moderate bilateral pleural effusions with septal thickening and patchy ground-glass disease in the lower lobes, findings could be secondary to pulmonary edema or infection. Generalized subcutaneous edema consistent with anasarca. 4. Aortic atherosclerosis. Aortic Atherosclerosis (ICD10-I70.0). Electronically Signed   By: Luke Bun M.D.   On: 05/11/2024 23:30   DG Chest Portable 1 View Result Date: 05/11/2024 CLINICAL DATA:  hf sob EXAM: PORTABLE CHEST 1 VIEW COMPARISON:  Chest x-ray 03/02/2024 FINDINGS: The heart and mediastinal contours are within normal limits. No focal consolidation. No pulmonary edema. Interval development of  bilateral small pleural effusions. No pneumothorax. No acute osseous abnormality. IMPRESSION: Interval development of bilateral small pleural effusions. Electronically Signed   By: Morgane  Naveau M.D.   On: 05/11/2024 21:47   DG C-Arm 1-60 Min-No Report Result Date: 05/01/2024 Fluoroscopy was utilized by the requesting physician.  No radiographic interpretation.   (Echo, Carotid, EGD, Colonoscopy, ERCP)    Subjective: Patient seen and examined.  Daughter at the bedside.  Patient initially complained of unresolved ankle swelling and wanted to have x-rays of the ankle.  We discussed about  using compression stockings and mobilizing and eating well. At the time of discharge, she complained of right-sided chest discomfort after walking.  EKG was nonischemic.  Symptoms improved  and no recurrence of chest pain.   Discharge Exam: Vitals:   05/17/24 0538 05/17/24 0906  BP: 109/77 119/80  Pulse: 78 82  Resp: 18   Temp: 98.4 F (36.9 C)   SpO2: 95%    Vitals:   05/16/24 1955 05/17/24 0538 05/17/24 0700 05/17/24 0906  BP: 119/81 109/77  119/80  Pulse: 88 78  82  Resp: 19 18    Temp: 98.1 F (36.7 C) 98.4 F (36.9 C)    TempSrc: Oral Oral    SpO2: 95% 95%    Weight:   83.9 kg   Height:        General: Pt is alert, awake, not in acute distress Pleasant to interaction.  Chronically sick looking.  Not in any distress. Cardiovascular: RRR, S1/S2 +, no rubs, no gallops Respiratory: CTA bilaterally, no wheezing, no rhonchi Abdominal: Soft, NT, ND, bowel sounds +, Foley catheter with clear urine. Extremities: no edema, no cyanosis    The results of significant diagnostics from this hospitalization (including imaging, microbiology, ancillary and laboratory) are listed below for reference.     Microbiology: Recent Results (from the past 240 hours)  Resp panel by RT-PCR (RSV, Flu A&B, Covid) Anterior Nasal Swab     Status: None   Collection Time: 05/11/24  9:15 PM   Specimen: Anterior Nasal Swab  Result Value Ref Range Status   SARS Coronavirus 2 by RT PCR NEGATIVE NEGATIVE Final   Influenza A by PCR NEGATIVE NEGATIVE Final   Influenza B by PCR NEGATIVE NEGATIVE Final    Comment: (NOTE) The Xpert Xpress SARS-CoV-2/FLU/RSV plus assay is intended as an aid in the diagnosis of influenza from Nasopharyngeal swab specimens and should not be used as a sole basis for treatment. Nasal washings and aspirates are unacceptable for Xpert Xpress SARS-CoV-2/FLU/RSV testing.  Fact Sheet for Patients: BloggerCourse.com  Fact Sheet for Healthcare  Providers: SeriousBroker.it  This test is not yet approved or cleared by the [place]  FDA and has been authorized for detection and/or diagnosis of SARS-CoV-2 by FDA under an Emergency Use Authorization (EUA). This EUA will remain in effect (meaning this test can be used) for the duration of the COVID-19 declaration under Section 564(b)(1) of the Act, 21 U.S.C. section 360bbb-3(b)(1), unless the authorization is terminated or revoked.     Resp Syncytial Virus by PCR NEGATIVE NEGATIVE Final    Comment: (NOTE) Fact Sheet for Patients: BloggerCourse.com  Fact Sheet for Healthcare Providers: SeriousBroker.it  This test is not yet approved or cleared by the [place]  FDA and has been authorized for detection and/or diagnosis of SARS-CoV-2 by FDA under an Emergency Use Authorization (EUA). This EUA will remain in effect (meaning this test can be used) for the duration of the COVID-19 declaration under Section 564(b)(1)  of the Act, 21 U.S.C. section 360bbb-3(b)(1), unless the authorization is terminated or revoked.  Performed at [facility], [address] [place], [place] [address]   Blood culture (routine x 2)     Status: None   Collection Time: 05/11/24 11:44 PM   Specimen: BLOOD  Result Value Ref Range Status   Specimen Description BLOOD LEFT ANTECUBITAL  Final   Special Requests   Final    BOTTLES DRAWN AEROBIC AND ANAEROBIC Blood Culture results may not be optimal due to an inadequate volume of blood received in culture bottles   Culture   Final    NO GROWTH 5 DAYS Performed at [facility], [address] [place], [place] [address]    Report Status 05/17/2024 FINAL  Final  Blood culture (routine x 2)     Status: None   Collection Time: 05/11/24 11:49 PM   Specimen: BLOOD  Result Value Ref Range Status   Specimen Description BLOOD RIGHT ANTECUBITAL  Final   Special Requests    Final    BOTTLES DRAWN AEROBIC AND ANAEROBIC Blood Culture results may not be optimal due to an inadequate volume of blood received in culture bottles   Culture   Final    NO GROWTH 5 DAYS Performed at [facility], [address], [place], [place] [address]    Report Status 05/17/2024 FINAL  Final  MRSA Next Gen by PCR, Nasal     Status: None   Collection Time: 05/12/24  5:11 AM   Specimen: Nasal Mucosa; Nasal Swab  Result Value Ref Range Status   MRSA by PCR Next Gen NOT DETECTED NOT DETECTED Final    Comment: (NOTE) The GeneXpert MRSA Assay (FDA approved for NASAL specimens only), is one component of a comprehensive MRSA colonization surveillance program. It is not intended to diagnose MRSA infection nor to guide or monitor treatment for MRSA infections. Test performance is not FDA approved in patients less than 54 years old. Performed at [facility], [address] [place], [place] [address]   Remove urinary catheter to obtain Clean Catch urine culture     Status: Abnormal   Collection Time: 05/12/24  1:46 PM   Specimen: Urine, Clean Catch  Result Value Ref Range Status   Specimen Description   Final    URINE, CLEAN CATCH Performed at [facility], [address] [place], [place] [address]    Special Requests   Final    NONE Performed at [facility], [address] [place], [place] [address]    Culture >=100,000 COLONIES/mL YEAST (A)  Final   Report Status 05/13/2024 FINAL  Final     Labs: BNP (last 3 results) Recent Labs    12/26/23 1624 05/11/24 2100  BNP 119.1* 2,255.1*   Basic Metabolic Panel: Recent Labs  Lab 05/12/24 0607 05/12/24 1002 05/12/24 1412 05/12/24 1834 05/13/24 0313 05/14/24 0801 05/15/24 0509  NA 140   < > 137 138 137 140 136  K 3.5   < > 4.4 4.0 3.9 4.2 4.3  CL 103   < > 104 104 104 104 101  CO2 21*   < > 22 23 23 24 23   GLUCOSE 170*   < > 132* 91 81 118* 211*  BUN  12   < > 13 12 12 14 15   CREATININE 0.84   < > 0.79 0.82 0.80 0.83 0.91  CALCIUM  8.5*   < > 8.3*  8.3* 8.6* 8.6* 8.9  MG 1.9  --   --   --  1.8  --   --   PHOS  --   --   --   --  2.5  --   --    < > = values in this interval not displayed.   Liver Function Tests: Recent Labs  Lab 05/11/24 2100 05/13/24 0313  AST 18 29  ALT 11 16  ALKPHOS 127* 250*  BILITOT 0.4 0.3  PROT 7.7 6.5  ALBUMIN 2.8* 3.1*   No results for input(s): LIPASE, AMYLASE in the last 168 hours. No results for input(s): AMMONIA in the last 168 hours. CBC: Recent Labs  Lab 05/11/24 2100 05/11/24 2128 05/12/24 0142 05/12/24 0607 05/13/24 0313 05/14/24 0801  WBC 8.5  --   --  9.2 8.6 7.1  NEUTROABS 6.9  --   --  6.8  --   --   HGB 9.1* 9.5*  9.9* 9.2* 7.9* 7.9* 7.9*  HCT 28.8* 28.0*  29.0* 27.0* 26.5* 26.8* 25.6*  MCV 82.1  --   --  83.1 82.5 81.3  PLT 432*  --   --  358 345 295   Cardiac Enzymes: No results for input(s): CKTOTAL, CKMB, CKMBINDEX, TROPONINI in the last 168 hours. BNP: Invalid input(s): POCBNP CBG: Recent Labs  Lab 05/16/24 0726 05/16/24 1202 05/16/24 1628 05/16/24 2043 05/17/24 0721  GLUCAP 88 116* 126* 172* 114*   D-Dimer No results for input(s): DDIMER in the last 72 hours. Hgb A1c No results for input(s): HGBA1C in the last 72 hours. Lipid Profile No results for input(s): CHOL, HDL, LDLCALC, TRIG, CHOLHDL, LDLDIRECT in the last 72 hours. Thyroid function studies No results for input(s): TSH, T4TOTAL, T3FREE, THYROIDAB in the last 72 hours.  Invalid input(s): FREET3 Anemia work up No results for input(s): VITAMINB12, FOLATE, FERRITIN, TIBC, IRON , RETICCTPCT in the last 72 hours. Urinalysis    Component Value Date/Time   COLORURINE YELLOW 05/12/2024 0030   APPEARANCEUR CLOUDY (A) 05/12/2024 0030   LABSPEC 1.007 05/12/2024 0030   PHURINE 5.0 05/12/2024 0030   GLUCOSEU >=500 (A) 05/12/2024 0030   HGBUR MODERATE  (A) 05/12/2024 0030   BILIRUBINUR NEGATIVE 05/12/2024 0030   BILIRUBINUR negative 01/14/2024 1259   BILIRUBINUR Negative 03/17/2021 1107   KETONESUR NEGATIVE 05/12/2024 0030   PROTEINUR 100 (A) 05/12/2024 0030   UROBILINOGEN 0.2 01/14/2024 1259   UROBILINOGEN 0.2 11/07/2020 1150   NITRITE NEGATIVE 05/12/2024 0030   LEUKOCYTESUR MODERATE (A) 05/12/2024 0030   Sepsis Labs Recent Labs  Lab 05/11/24 2100 05/12/24 0607 05/13/24 0313 05/14/24 0801  WBC 8.5 9.2 8.6 7.1   Microbiology Recent Results (from the past 240 hours)  Resp panel by RT-PCR (RSV, Flu A&B, Covid) Anterior Nasal Swab     Status: None   Collection Time: 05/11/24  9:15 PM   Specimen: Anterior Nasal Swab  Result Value Ref Range Status   SARS Coronavirus 2 by RT PCR NEGATIVE NEGATIVE Final   Influenza A by PCR NEGATIVE NEGATIVE Final   Influenza B by PCR NEGATIVE NEGATIVE Final    Comment: (NOTE) The Xpert Xpress SARS-CoV-2/FLU/RSV plus assay is intended as an aid in the diagnosis of influenza from Nasopharyngeal swab specimens and should not be used as a sole basis for treatment. Nasal washings and aspirates are unacceptable for Xpert Xpress SARS-CoV-2/FLU/RSV testing.  Fact Sheet for Patients: BloggerCourse.com  Fact Sheet for Healthcare Providers: SeriousBroker.it  This test is not yet approved or cleared by the  [place]  FDA and has been authorized for detection and/or diagnosis of SARS-CoV-2 by FDA under an Emergency Use Authorization (EUA). This EUA will remain in effect (meaning this test can be used) for the duration of the COVID-19 declaration under Section 564(b)(1) of the Act, 21 U.S.C. section 360bbb-3(b)(1), unless the authorization is terminated or revoked.     Resp Syncytial Virus by PCR NEGATIVE NEGATIVE Final    Comment: (NOTE) Fact Sheet for Patients: BloggerCourse.com  Fact Sheet for Healthcare  Providers: SeriousBroker.it  This test is not yet approved or cleared by the [place]  FDA and has been authorized for detection and/or diagnosis of SARS-CoV-2 by FDA under an Emergency Use Authorization (EUA). This EUA will remain in effect (meaning this test can be used) for the duration of the COVID-19 declaration under Section 564(b)(1) of the Act, 21 U.S.C. section 360bbb-3(b)(1), unless the authorization is terminated or revoked.  Performed at [facility], [address] [place], [place] [address]   Blood culture (routine x 2)     Status: None   Collection Time: 05/11/24 11:44 PM   Specimen: BLOOD  Result Value Ref Range Status   Specimen Description BLOOD LEFT ANTECUBITAL  Final   Special Requests   Final    BOTTLES DRAWN AEROBIC AND ANAEROBIC Blood Culture results may not be optimal due to an inadequate volume of blood received in culture bottles   Culture   Final    NO GROWTH 5 DAYS Performed at [facility], [address] [place], [place] [address]    Report Status 05/17/2024 FINAL  Final  Blood culture (routine x 2)     Status: None   Collection Time: 05/11/24 11:49 PM   Specimen: BLOOD  Result Value Ref Range Status   Specimen Description BLOOD RIGHT ANTECUBITAL  Final   Special Requests   Final    BOTTLES DRAWN AEROBIC AND ANAEROBIC Blood Culture results may not be optimal due to an inadequate volume of blood received in culture bottles   Culture   Final    NO GROWTH 5 DAYS Performed at [facility], [address] [place], [place] [address]    Report Status 05/17/2024 FINAL  Final  MRSA Next Gen by PCR, Nasal     Status: None   Collection Time: 05/12/24  5:11 AM   Specimen: Nasal Mucosa; Nasal Swab  Result Value Ref Range Status   MRSA by PCR Next Gen NOT DETECTED NOT DETECTED Final    Comment: (NOTE) The GeneXpert MRSA Assay (FDA approved for NASAL specimens only), is one component of a comprehensive MRSA  colonization surveillance program. It is not intended to diagnose MRSA infection nor to guide or monitor treatment for MRSA infections. Test performance is not FDA approved in patients less than 61 years old. Performed at [facility], [address] [place], [place] [address]   Remove urinary catheter to obtain Clean Catch urine culture     Status: Abnormal   Collection Time: 05/12/24  1:46 PM   Specimen: Urine, Clean Catch  Result Value Ref Range Status   Specimen Description   Final    URINE, CLEAN CATCH Performed at [facility], [address] [place], [place] [address]    Special Requests   Final    NONE Performed at [facility], [address], [place], [place] [address]    Culture >=100,000 COLONIES/mL YEAST (A)  Final   Report Status 05/13/2024  FINAL  Final     Time coordinating discharge: 35 minutes  SIGNED:   Renato Applebaum, MD  [facility] 05/17/2024, 11:16 AM

## 2024-05-18 ENCOUNTER — Telehealth: Payer: Self-pay | Admitting: *Deleted

## 2024-05-18 NOTE — Transitions of Care (Post Inpatient/ED Visit) (Signed)
   05/18/2024  Name: Brooke [name] MRN: [id number] DOB: 10/12/1960  Today's TOC FU Call Status: Today's TOC FU Call Status:: Unsuccessful Call (1st Attempt) Unsuccessful Call (1st Attempt) Date: 05/18/24  Attempted to reach the patient regarding the most recent Inpatient/ED visit.  Follow Up Plan: Additional outreach attempts will be made to reach the patient to complete the Transitions of Care (Post Inpatient/ED visit) call.   Andrea Dimes RN, BSN [place]  [facility] Health RN Care Manager [phone]

## 2024-05-19 ENCOUNTER — Telehealth: Payer: Self-pay | Admitting: *Deleted

## 2024-05-19 NOTE — Transitions of Care (Post Inpatient/ED Visit) (Signed)
   05/19/2024  Name: Brooke [name] MRN: [id number] DOB: 08-11-1960  Today's TOC FU Call Status: Today's TOC FU Call Status:: Unsuccessful Call (2nd Attempt) Unsuccessful Call (2nd Attempt) Date: 05/19/24  Attempted to reach the patient regarding the most recent Inpatient/ED visit.  Follow Up Plan: Additional outreach attempts will be made to reach the patient to complete the Transitions of Care (Post Inpatient/ED visit) call.   Andrea Dimes RN, BSN [place]  [facility] Health RN Care Manager [phone]

## 2024-05-22 ENCOUNTER — Telehealth: Payer: Self-pay | Admitting: *Deleted

## 2024-05-22 NOTE — Transitions of Care (Post Inpatient/ED Visit) (Signed)
   05/22/2024  Name: Brooke [name] MRN: [id number] DOB: 1961-08-02  Today's TOC FU Call Status: Today's TOC FU Call Status:: Unsuccessful Call (3rd Attempt) Unsuccessful Call (3rd Attempt) Date: 05/22/24  Attempted to reach the patient regarding the most recent Inpatient/ED visit.  Follow Up Plan: No further outreach attempts will be made at this time. We have been unable to contact the patient.  Cathlean Headland BSN RN [place] [facility] Health Care Management Coordinator [email or web address] Direct Dial: [phone]  Fax: [phone] Website: [email or web address]

## 2024-06-05 ENCOUNTER — Telehealth: Payer: Self-pay | Admitting: Cardiology

## 2024-06-05 NOTE — Progress Notes (Deleted)
 Cardiology Office Note   Date:  06/05/2024  ID:  Brooke [name], DOB 1961-05-03, MRN [id number] PCP: [name], MD  [facility] Providers Cardiologist:  Oneil Parchment, MD     History of Present Illness Brooke [name] is a 63 y.o. female with a past medical history of poorly controlled type 2 diabetes, heart failure, recent admission for pyelonephritis and hydronephrosis, CAD, hypertension, PAD, hyperlipidemia.  Patient followed by Dr. Parchment and presents today for evaluation of swelling  Patient had been admitted in 10/2021 with chest pain.  Underwent echocardiogram 10/09/2021 that showed EF 20-25%, global hypokinesis, grade 2 diastolic dysfunction, moderately reduced RV systolic function.  Underwent right/left heart catheterization on 10/09/2021 and was found to have severe multivessel disease with severe LV dysfunction.  Thermodilution outputs demonstrated a cardiac point of 3 L/min and index of 2 L/min/m2.  Required milrinone  and was seen by advanced heart failure.  Cardiac MRI 10/14/2021 showed normal LV size with wall motion abnormalities and EF 24%, normal RV size with low normal RV function.  There was coronary disease type LGE present.  However LGE less than 50% wall thickness in all areas.  Patient was seen by CT surgery at that time, felt not to be a good candidate for bypass surgery given diffuse disease.  Patient was started on GDMT.  Repeat echo in 04/2022 showed EF 55-60%, no regional wall motion abnormalities, grade 1 DD, normal RV systolic function, no significant valvular abnormalities.  She had abnormal ABIs 07/2023 and underwent angiogram by Dr. Gretta with angioplasty to left below-knee popliteal artery and left posterior tibial artery on 07/29/2023.   Patient was last in the cardiology on 10/28/2023.  At that time, patient appeared euvolemic on exam.  Remained on Farxiga , metoprolol , Entresto , spironolactone , aspirin , atorvastatin , Plavix .  Patient was admitted to [facility] from 10/2 - 05/17/2024.  Had presented to the ED with shortness of breath, fever, right flank pain, peripheral edema.  Found to have right sided hydronephrosis and pyelonephritis.  Also found to have DKA.  Treated with insulin  infusion.  Discharged on insulin .  Treat with antibiotics for pyelonephritis.  Her diuretics were held during her admission.  She was discharged on aspirin  81 mg daily, atorvastatin  80 mg daily, Plavix  75 mg daily, Entresto  49-51 mg twice daily, Lasix  20 mg daily, metoprolol  succinate 12.5 mg daily.  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  Found to have severe multivessel CAD that was not amenable to CABG.  Treated with GDMT.  Echo in 04/2022 showed EF improved to 55-60%, grade 1 DD, normal RV function, no significant valvular abnormalities - Patient recently admitted to [facility] long with pyelonephritis and DKA.  Her spironolactone  and Farxiga  were held -  - Continue Entresto  49-51 mg twice daily, metoprolol  succinate 12.5 mg daily - Spiro? - Continue to hold Farxiga  given recent UTI/pyelonephritis - Ordered BMP, BNP  -K4.3, creatinine 0.91 on 10/6 - Ordered repeat echo   CAD  - Cath in 10/2021 showed severe multivessel disease.  Not candidate for cath due to diffuse disease and poor targets.  Anatomy not favorable for PCI. -   - Continue aspirin  81 mg daily, Plavix  75 mg daily - Continue Lipitor  80 mg daily - Continue metoprolol  succinate 50 mg daily  HTN   HLD   Type 2 DM  - Patient had recently been admitted with DKA in the setting of pyelonephritis - Followed by PCP, on insulin .  Farxiga  currently held due to recent DKA,  recent UTI/pyelo  ROS: ***  Studies Reviewed      *** Risk Assessment/Calculations {Does this patient have ATRIAL FIBRILLATION?:[phone]} No BP recorded.  {Refresh Note OR Click here to enter BP  :1}***       Physical Exam VS:  LMP 11/16/2014 (Approximate)        Wt Readings from Last 3 Encounters:   05/17/24 184 lb 15.5 oz (83.9 kg)  05/01/24 107 lb (48.5 kg)  04/20/24 107 lb (48.5 kg)    GEN: Well nourished, well developed in no acute distress NECK: No JVD; No carotid bruits CARDIAC: ***RRR, no murmurs, rubs, gallops RESPIRATORY:  Clear to auscultation without rales, wheezing or rhonchi  ABDOMEN: Soft, non-tender, non-distended EXTREMITIES:  No edema; No deformity   ASSESSMENT AND PLAN ***    {Are you ordering a CV Procedure (e.g. stress test, cath, DCCV, TEE, etc)?   Press F2        :[id number]}  Dispo: ***  Signed, Rollo FABIENE Louder, PA-C

## 2024-06-05 NOTE — Telephone Encounter (Signed)
 Pt reports that she recently started getting short of breath. A lot of swelling in legs. It has gotten worse over the last week an a half.   She has not been weighing herself daily. Instructed her to start weighing herself daily and log it. She stopped using the compression hose on Saturday night because it was too tight and uncomfortable because of all her swelling. She has been elevating legs throughout the day- when she can. She states that her sodium is very minimal.   Informed her that she can go ahead and take an extra dose of Lasix  20 mg. Her most recent lab work shows BNP and Creatinine wnl.   Appointment made for 06/06/24 at 0800 with Rollo Louder, PA-C. Given location of appointment. She verbalized understanding of all information.

## 2024-06-05 NOTE — Telephone Encounter (Signed)
 Pt c/o swelling: STAT is pt has developed SOB within 24 hours  How much weight have you gained and in what time span?  Maybe about 1-2 lbs since the beginning of October  If swelling, where is the swelling located?  Legs   Are you currently taking a fluid pill?  Yes   Are you currently SOB?  Not currently, but patient says she has been SOB recently.  Do you have a log of your daily weights (if so, list)?   Have you gained 3 pounds in a day or 5 pounds in a week?   Have you traveled recently?  No

## 2024-06-06 ENCOUNTER — Ambulatory Visit: Attending: Cardiology | Admitting: Cardiology

## 2024-06-15 ENCOUNTER — Ambulatory Visit: Attending: Family Medicine | Admitting: Family Medicine

## 2024-06-15 ENCOUNTER — Encounter: Payer: Self-pay | Admitting: Family Medicine

## 2024-06-15 VITALS — BP 117/72 | HR 105 | Temp 98.0°F | Ht 64.5 in | Wt 116.0 lb

## 2024-06-15 DIAGNOSIS — I5042 Chronic combined systolic (congestive) and diastolic (congestive) heart failure: Secondary | ICD-10-CM

## 2024-06-15 DIAGNOSIS — D649 Anemia, unspecified: Secondary | ICD-10-CM | POA: Diagnosis not present

## 2024-06-15 DIAGNOSIS — N312 Flaccid neuropathic bladder, not elsewhere classified: Secondary | ICD-10-CM | POA: Diagnosis not present

## 2024-06-15 DIAGNOSIS — R600 Localized edema: Secondary | ICD-10-CM | POA: Diagnosis not present

## 2024-06-15 DIAGNOSIS — I152 Hypertension secondary to endocrine disorders: Secondary | ICD-10-CM

## 2024-06-15 DIAGNOSIS — N12 Tubulo-interstitial nephritis, not specified as acute or chronic: Secondary | ICD-10-CM | POA: Diagnosis not present

## 2024-06-15 DIAGNOSIS — E1159 Type 2 diabetes mellitus with other circulatory complications: Secondary | ICD-10-CM | POA: Diagnosis not present

## 2024-06-15 DIAGNOSIS — E1165 Type 2 diabetes mellitus with hyperglycemia: Secondary | ICD-10-CM | POA: Diagnosis not present

## 2024-06-15 DIAGNOSIS — I110 Hypertensive heart disease with heart failure: Secondary | ICD-10-CM | POA: Diagnosis not present

## 2024-06-15 DIAGNOSIS — Z794 Long term (current) use of insulin: Secondary | ICD-10-CM

## 2024-06-15 NOTE — Progress Notes (Signed)
 Subjective:  Patient ID: Brooke [name], female    DOB: Jul 03, 1961  Age: 63 y.o. MRN: [id number]  CC: Medical Management of Chronic Issues (Swelling in legs)     Discussed the use of AI scribe software for clinical note transcription with the patient, who gave verbal consent to proceed.  History of Present Illness Brooke [name] is a 63 year old female with type 2 diabetes mellitus, HFmEF (EF 55 to 60% on echo of 04/2022 improved from 20 to 25%), CAD, hypertension, anxiety and depression, protein calorie malnutrition, lumbar radiculopathy, atonic bladder (Foley catheter in place), hydronephrosis*post right ureteral stent placement who presents with recurrent pyelonephritis and sepsis.  She was recently hospitalized from 10/2 through 05/17/2024 for CHF exacerbation, sepsis secondary to right hydronephrosis and pyelonephritis also found to be in DKA.  Urine culture was positive for Candida which was treated with antifungal catheter was changed.  She was also treated with 10 days of antibiotic therapy. She stated that severe pain persisted in the left quadrant, despite initial surgical intervention on the right side leading to her ED presentation. She has difficulty sleeping and significant discomfort, describing her stomach as 'ripped in half' due to extensive antibiotic use.   Her diabetes management includes 20 units of Lantus  at night, increasing to 21 units if needed, and Novolog  with meals, although she often skips meals. Her last A1c was 10.7% in September. Blood sugar readings at home have been as high as 175 mg/dL in the evening and 894-889 mg/dL fasting in the morning.  She has congestive heart failure with recent leg swelling for which her cardiologist had advised increasing Lasix  from 20 mg to 40 mg.  The swelling is intermittent and improves with leg elevation. Shortness of breath has improved recently. A previous echocardiogram showed an ejection fraction of 55-60%.  She uses a Foley  catheter due to an atonic bladder, experiencing significant pain and difficulty managing it. She seeks a second opinion from another urologist due to dissatisfaction with her current care.  She has anemia, with a previous hemoglobin level of 7.6, possibly related to infection.     Past Medical History:  Diagnosis Date   Abdominal pain, left lower quadrant 12/17/2015   Anemia    Anxiety 07/01/2016   Carotid artery disease 10/27/2022   Chest pain, unspecified 10/05/2003   CHF (congestive heart failure) (HCC)    Chronic systolic heart failure (HCC) 02/20/2022   Depression, major, recurrent, moderate (HCC) 07/31/2005   Diabetes mellitus without complication (HCC) 09/08/2006   Endometriosis 03/28/1996   GERD (gastroesophageal reflux disease)    Heart murmur    History of colonic polyps 08/07/2015   20 mm removed 05/10/15 by Dr. Kristie     PAD (peripheral artery disease)    Unspecified constipation 06/24/2004    Past Surgical History:  Procedure Laterality Date   ABDOMINAL AORTOGRAM W/LOWER EXTREMITY Left 07/29/2023   Procedure: ABDOMINAL AORTOGRAM W/LOWER EXTREMITY;  Surgeon: Gretta Lonni PARAS, MD;  Location: [facility] INVASIVE CV LAB;  Service: Cardiovascular;  Laterality: Left;   APPENDECTOMY  08/10/1994   Dr. Buford   BREAST SURGERY Bilateral 08/10/1994   Dr. Ardeen   calcifications removed   CYSTOSCOPY W/ RETROGRADES Right 05/01/2024   Procedure: CYSTOSCOPY, WITH RETROGRADE PYELOGRAM;  Surgeon: Selma Donnice SAUNDERS, MD;  Location: [facility] ORS;  Service: Urology;  Laterality: Right;   CYSTOSCOPY W/ URETERAL STENT PLACEMENT Right 03/02/2024   Procedure: CYSTOSCOPY, WITH RETROGRADE PYELOGRAM AND URETERAL STENT INSERTION;  Surgeon: Selma Donnice SAUNDERS, MD;  Location:  [facility] OR;  Service: Urology;  Laterality: Right;   CYSTOSCOPY WITH BIOPSY N/A 05/01/2024   Procedure: CYSTOSCOPY, WITH URETERAL BIOPSY;  Surgeon: Selma Donnice SAUNDERS, MD;  Location: [facility] ORS;  Service: Urology;  Laterality: N/A;    CYSTOSCOPY/URETEROSCOPY/HOLMIUM LASER/STENT PLACEMENT Right 05/01/2024   Procedure: CYSTOSCOPY/URETEROSCOPY/EXCHANGE;  Surgeon: Selma Donnice SAUNDERS, MD;  Location: [facility] ORS;  Service: Urology;  Laterality: Right;   ESOPHAGEAL MANOMETRY N/A 03/23/2023   Procedure: ESOPHAGEAL MANOMETRY (EM);  Surgeon: Leigh Elspeth SQUIBB, MD;  Location: [facility] ENDOSCOPY;  Service: Gastroenterology;  Laterality: N/A;   INGUINAL HERNIA REPAIR     OVARIAN CYST REMOVAL     PERIPHERAL VASCULAR BALLOON ANGIOPLASTY Left 07/29/2023   Procedure: PERIPHERAL VASCULAR BALLOON ANGIOPLASTY;  Surgeon: Gretta Lonni PARAS, MD;  Location: [facility] INVASIVE CV LAB;  Service: Cardiovascular;  Laterality: Left;   RIGHT/LEFT HEART CATH AND CORONARY ANGIOGRAPHY N/A 10/09/2021   Procedure: RIGHT/LEFT HEART CATH AND CORONARY ANGIOGRAPHY;  Surgeon: Wendel Lurena POUR, MD;  Location: [facility] INVASIVE CV LAB;  Service: Cardiovascular;  Laterality: N/A;   TOOTH EXTRACTION N/A 07/28/2022   Procedure: EXTRACTION ALL REMAING TEETH THREE, FOUR, FIVE, SIX, SEVEN, EIGHT, NINE, TEN, ELEVEN, TWELVE, TWENTY ONE, TWENTY TWO, TWENTY THREE, TWENTY FOUR, TWENTY FIVE, TWENTY SIX, TWENTY SEVEN, TWENTY EIGHT, TWENTY NINE, THIRTY, AVEOLOPLASTY;  Surgeon: Sheryle Hamilton, DMD;  Location: [facility] OR;  Service: Oral Surgery;  Laterality: N/A;    Family History  Problem Relation Age of Onset   Diabetes Mother    Hypertension Mother    Arthritis Mother        RA   Pancreatic cancer Father    Stomach cancer Neg Hx    Colon cancer Neg Hx    Esophageal cancer Neg Hx    Rectal cancer Neg Hx     Social History   Socioeconomic History   Marital status: Married    Spouse name: Not on file   Number of children: 1   Years of education: Not on file   Highest education level: Not on file  Occupational History   Not on file  Tobacco Use   Smoking status: Never   Smokeless tobacco: Never  Vaping Use   Vaping status: Never Used  Substance and Sexual Activity   Alcohol use: No   Drug  use: No   Sexual activity: Not Currently  Other Topics Concern   Not on file  Social History Narrative   Not on file   Social Drivers of Health   Financial Resource Strain: Medium Risk (10/31/2021)   Overall Financial Resource Strain (CARDIA)    Difficulty of Paying Living Expenses: Somewhat hard  Food Insecurity: No Food Insecurity (05/12/2024)   Hunger Vital Sign    Worried About Running Out of Food in the Last Year: Never true    Ran Out of Food in the Last Year: Never true  Transportation Needs: No Transportation Needs (05/12/2024)   PRAPARE - Administrator, Civil Service (Medical): No    Lack of Transportation (Non-Medical): No  Physical Activity: Not on file  Stress: Not on file  Social Connections: Not on file    Allergies  Allergen Reactions   Bayer Aspirin  [Aspirin ] Other (See Comments)    Stomach pain occurs with 325mg  dose, able to tolerate 81mg .   Darvon-N [Propoxyphene] Rash and Other (See Comments)    Darvocet    Ery-Tab [Erythromycin] Nausea And Vomiting   Glucophage  [Metformin ] Diarrhea, Nausea Only and Other (See Comments)    General GI intolerance  Motrin [Ibuprofen] Other (See Comments)    Stomach pain   Neurontin [Gabapentin] Other (See Comments)    Suicidal ideations     Outpatient Medications Prior to Visit  Medication Sig Dispense Refill   acetaminophen  (TYLENOL ) 500 MG tablet Take 1,000 mg by mouth every 6 (six) hours as needed for headache or fever (pain).     aspirin  81 MG chewable tablet Chew 1 tablet (81 mg total) by mouth daily. (Patient taking differently: Chew 81 mg by mouth at bedtime.) 30 tablet 1   atorvastatin  (LIPITOR ) 80 MG tablet Take 1 tablet (80 mg total) by mouth daily. (Patient taking differently: Take 80 mg by mouth at bedtime.) 90 tablet 1   clopidogrel  (PLAVIX ) 75 MG tablet Take 1 tablet (75 mg total) by mouth daily. 90 tablet 1   Continuous Glucose Receiver (DEXCOM G7 RECEIVER) DEVI Apply as directed. 3 each 3    furosemide  (LASIX ) 20 MG tablet Take 1 tablet (20 mg total) by mouth daily. 30 tablet 0   Glucose Blood (BLOOD GLUCOSE TEST STRIPS) STRP 1 each by Does not apply route 3 (three) times daily. Use as directed to check blood sugar. May dispense any manufacturer covered by patient's insurance and fits patient's device. 100 strip 0   insulin  aspart (NOVOLOG ) 100 UNIT/ML FlexPen Inject 3-14 Units into the skin 3 (three) times daily with meals. If eating and Blood Glucose (BG) 80 or higher inject 3 units for meal coverage and add correction dose per scale. If not eating, correction dose only. BG <150= 0 unit; BG 150-200= 1 unit; BG 201-250= 3 unit; BG 251-300= 5 unit; BG 301-350= 7 unit; BG 351-400= 9 unit; BG >400= 11 unit and Call Primary Care. 15 mL 0   insulin  glargine (LANTUS ) 100 UNIT/ML Solostar Pen Inject 20 Units into the skin daily. (Patient taking differently: Inject 18-20 Units into the skin See admin instructions. Inject 18u three times daily with meals and 20u at bedtime.) 15 mL 3   Insulin  Pen Needle (PEN NEEDLES) 31G X 5 MM MISC Use 3 times a day. 100 each 0   Iron , Ferrous Sulfate , 325 (65 Fe) MG TABS Take 325 mg by mouth daily. 30 tablet 1   Lancet Device MISC 1 each by Does not apply route 3 (three) times daily. May dispense any manufacturer covered by patient's insurance. 1 each 0   metoprolol  succinate (TOPROL -XL) 25 MG 24 hr tablet Take 0.5 tablets (12.5 mg total) by mouth daily. Take with or immediately following a meal.     sacubitril -valsartan  (ENTRESTO ) 49-51 MG Take 1 tablet by mouth 2 (two) times daily. 180 tablet 3   vitamin B-12 (CYANOCOBALAMIN) 1000 MCG tablet Take 3,000 mcg by mouth daily.     No facility-administered medications prior to visit.     ROS Review of Systems  Constitutional:  Negative for activity change and appetite change.  HENT:  Negative for sinus pressure and sore throat.   Respiratory:  Negative for chest tightness, shortness of breath and wheezing.    Cardiovascular:  Positive for leg swelling. Negative for chest pain and palpitations.  Gastrointestinal:  Negative for abdominal distention, abdominal pain and constipation.  Genitourinary: Negative.   Musculoskeletal: Negative.   Psychiatric/Behavioral:  Negative for behavioral problems and dysphoric mood.     Objective:  BP 117/72   Pulse (!) 105   Temp 98 F (36.7 C) (Oral)   Ht 5' 4.5 (1.638 m)   Wt 116 lb (52.6 kg)   LMP 11/16/2014 (Approximate)  SpO2 100%   BMI 19.60 kg/m      06/15/2024   10:23 AM 05/17/2024    9:06 AM 05/17/2024    7:00 AM  BP/Weight  Systolic BP 117 119   Diastolic BP 72 80   Wt. (Lbs) 116  184.97  BMI 19.6 kg/m2  31.26 kg/m2      Physical Exam Constitutional:      Appearance: She is well-developed.  Cardiovascular:     Rate and Rhythm: Tachycardia present.     Heart sounds: Normal heart sounds. No murmur heard. Pulmonary:     Effort: Pulmonary effort is normal.     Breath sounds: Normal breath sounds. No wheezing or rales.  Chest:     Chest wall: No tenderness.  Abdominal:     General: Bowel sounds are normal. There is no distension.     Palpations: Abdomen is soft. There is no mass.     Tenderness: There is no abdominal tenderness.  Musculoskeletal:        General: Normal range of motion.     Right lower leg: Edema present.     Left lower leg: Edema present.  Neurological:     Mental Status: She is alert and oriented to person, place, and time.  Psychiatric:        Mood and Affect: Mood normal.        Latest Ref Rng & Units 05/15/2024    5:09 AM 05/14/2024    8:01 AM 05/13/2024    3:13 AM  CMP  Glucose 70 - 99 mg/dL 788  881  81   BUN 8 - 23 mg/dL 15  14  12    Creatinine 0.44 - 1.00 mg/dL 9.08  9.16  9.19   Sodium 135 - 145 mmol/L 136  140  137   Potassium 3.5 - 5.1 mmol/L 4.3  4.2  3.9   Chloride 98 - 111 mmol/L 101  104  104   CO2 22 - 32 mmol/L 23  24  23    Calcium  8.9 - 10.3 mg/dL 8.9  8.6  8.6   Total Protein 6.5  - 8.1 g/dL   6.5   Total Bilirubin 0.0 - 1.2 mg/dL   0.3   Alkaline Phos 38 - 126 U/L   250   AST 15 - 41 U/L   29   ALT 0 - 44 U/L   16     Lipid Panel     Component Value Date/Time   CHOL 147 02/20/2022 1548   CHOL 192 12/13/2015 0856   TRIG 134 02/20/2022 1548   HDL 42 02/20/2022 1548   HDL 41 12/13/2015 0856   CHOLHDL 3.5 02/20/2022 1548   VLDL 27 02/20/2022 1548   LDLCALC 78 02/20/2022 1548   LDLCALC 118 (H) 12/13/2015 0856    CBC    Component Value Date/Time   WBC 7.1 05/14/2024 0801   RBC 3.15 (L) 05/14/2024 0801   HGB 7.9 (L) 05/14/2024 0801   HGB 9.4 (L) 03/15/2024 1538   HCT 25.6 (L) 05/14/2024 0801   HCT 30.1 (L) 03/15/2024 1538   PLT 295 05/14/2024 0801   PLT 469 (H) 03/15/2024 1538   MCV 81.3 05/14/2024 0801   MCV 83 03/15/2024 1538   MCH 25.1 (L) 05/14/2024 0801   MCHC 30.9 05/14/2024 0801   RDW 16.6 (H) 05/14/2024 0801   RDW 15.5 (H) 03/15/2024 1538   LYMPHSABS 1.8 05/12/2024 0607   LYMPHSABS 2.2 03/15/2024 1538   MONOABS 0.5 05/12/2024 9392  EOSABS 0.0 05/12/2024 0607   EOSABS 0.1 03/15/2024 1538   BASOSABS 0.0 05/12/2024 0607   BASOSABS 0.0 03/15/2024 1538    Lab Results  Component Value Date   HGBA1C 10.7 (H) 04/20/2024       Assessment & Plan Type 2 diabetes mellitus, uncontrolled Blood glucose generally controlled with fasting levels 105-110 mg/dL, recent high 824 mg/dL. Last A1c elevated at 10.7% in September. - Continue Lantus  20 units at night and Novolog  with meals. - Follow-up in December to check A1c and adjust regimen if indicated -Counseled on Diabetic diet, the healthy plate, 849 minutes of moderate intensity exercise/week Blood sugar logs with fasting goals of 80-120 mg/dl, random of less than 819 and in the event of sugars less than 60 mg/dl or greater than 599 mg/dl encouraged to notify the clinic. Advised on the need for annual eye exams, annual foot exams, Pneumonia vaccine.   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 Leg swelling possibly due to fluid retention. Shortness of breath improved. - Continue Lasix  40 mg daily (dose recently increased by cardiology), continue Entresto  - Elevate legs to manage swelling. - Monitor potassium levels. - Follow up with cardiologist on November 17th.   Hypertension associated with type 2 diabetes mellitus - Controlled - Continue current antihypertensive regimen -Counseled on blood pressure goal of less than 130/80, low-sodium, DASH diet, medication compliance, 150 minutes of moderate intensity exercise per week. Discussed medication compliance, adverse effects.   Atonic bladder with indwelling Foley catheter Difficulty with self-catheterization due to leg swelling. Urologist advised indwelling catheter due to atonic bladder. - Continue with indwelling Foley catheter. - Referred to Dr. Bethena Novak for second opinion.  Recurrent urinary tract infection and pyelonephritis Recent hospitalization for sepsis relate.  Persistent left upper quadrant pain. - Manage blood sugar to prevent recurrence. - She has completed a course of antibiotics - Follow-up with urology   Anemia Last hemoglobin 7.6, possibly related to recent infection versus anemia of chronic disease - Ordered blood tests to check hemoglobin levels.   General Health Maintenance Discussed importance of controlling blood sugar to prevent complications. - Continue current diabetes management plan.      No orders of the defined types were placed in this encounter.   Follow-up: Return in about 6 weeks (around 07/27/2024) for Diabetes follow-up with PCP.       Corrina Sabin, MD, FAAFP. [facility] [place], [place] [phone]   06/15/2024, 2:14 PM

## 2024-06-15 NOTE — Patient Instructions (Signed)
 VISIT SUMMARY:  Brooke [name], a 63 year old female with diabetes and congestive heart failure, visited today due to recurrent urinary tract infections and sepsis. She experiences significant pain and discomfort, particularly in the left quadrant, and has difficulty managing her diabetes and bladder issues. Her diabetes management includes Lantus  and Novolog , and she has a Foley catheter due to an atonic bladder. She also has a history of anemia and hypokalemia.  YOUR PLAN:  -TYPE 2 DIABETES MELLITUS, UNCONTROLLED: Your blood sugar levels are generally controlled with fasting levels between 105-110 mg/dL, but your last J8r was elevated at 10.7% in September. Continue taking Lantus  20 units at night and Novolog  with meals. We will check your A1c again in December.  -CONGESTIVE HEART FAILURE: You have experienced leg swelling and shortness of breath, which have improved recently. Continue taking Lasix  40 mg daily and elevate your legs to manage the swelling. We will monitor your potassium levels and you should follow up with your cardiologist on November 17th.  -ATONIC BLADDER WITH INDWELLING FOLEY CATHETER: You have difficulty with self-catheterization due to leg swelling, and an indwelling Foley catheter has been advised. Continue using the Foley catheter and you have been referred to Dr. Bethena Novak for a second opinion.  -RECURRENT URINARY TRACT INFECTION AND PYELONEPHRITIS: You were recently hospitalized for sepsis related to a left-sided infection and are experiencing persistent left upper quadrant pain. Managing your blood sugar is important to prevent recurrence of infections.  -RIGHT HYDRONEPHROSIS, STATUS POST STENT: You had a stent placed due to right hydronephrosis and are experiencing left upper quadrant pain. Continue follow-up with your urologist for stent management.  -ANEMIA: Your last hemoglobin level was 7.6, possibly related to a recent infection. Blood tests have been ordered to  check your hemoglobin levels.  -HYPOKALEMIA: Your potassium level was 4.3, but increased Lasix  may affect your potassium levels. Blood tests have been ordered to check your potassium levels.  -GENERAL HEALTH MAINTENANCE: We discussed the importance of controlling your blood sugar to prevent complications. Continue with your current diabetes management plan.  INSTRUCTIONS:  Please follow up with your cardiologist on November 17th. We will check your A1c again in December. Blood tests have been ordered to check your hemoglobin and potassium levels. You have been referred to Dr. Bethena Novak for a second opinion regarding your bladder issues.

## 2024-06-16 ENCOUNTER — Other Ambulatory Visit: Payer: Self-pay

## 2024-06-16 ENCOUNTER — Ambulatory Visit: Payer: Self-pay | Admitting: Family Medicine

## 2024-06-16 DIAGNOSIS — E876 Hypokalemia: Secondary | ICD-10-CM

## 2024-06-16 MED ORDER — POTASSIUM CHLORIDE CRYS ER 20 MEQ PO TBCR
20.0000 meq | EXTENDED_RELEASE_TABLET | Freq: Two times a day (BID) | ORAL | 0 refills | Status: DC
  Filled 2024-06-16: qty 60, 30d supply, fill #0

## 2024-06-17 LAB — POTASSIUM: Potassium: 2.9 mmol/L — ABNORMAL LOW (ref 3.5–5.2)

## 2024-06-17 LAB — MICROALBUMIN / CREATININE URINE RATIO
Creatinine, Urine: 64.4 mg/dL
Microalb/Creat Ratio: 1246 mg/g{creat} — ABNORMAL HIGH (ref 0–29)
Microalbumin, Urine: 802.7 ug/mL

## 2024-06-17 LAB — CBC WITH DIFFERENTIAL/PLATELET
Basophils Absolute: 0 x10E3/uL (ref 0.0–0.2)
Basos: 0 %
EOS (ABSOLUTE): 0.1 x10E3/uL (ref 0.0–0.4)
Eos: 1 %
Hematocrit: 36.4 % (ref 34.0–46.6)
Hemoglobin: 10.9 g/dL — ABNORMAL LOW (ref 11.1–15.9)
Immature Grans (Abs): 0 x10E3/uL (ref 0.0–0.1)
Immature Granulocytes: 0 %
Lymphocytes Absolute: 2.2 x10E3/uL (ref 0.7–3.1)
Lymphs: 31 %
MCH: 24.3 pg — ABNORMAL LOW (ref 26.6–33.0)
MCHC: 29.9 g/dL — ABNORMAL LOW (ref 31.5–35.7)
MCV: 81 fL (ref 79–97)
Monocytes Absolute: 0.4 x10E3/uL (ref 0.1–0.9)
Monocytes: 6 %
Neutrophils Absolute: 4.4 x10E3/uL (ref 1.4–7.0)
Neutrophils: 62 %
Platelets: 333 x10E3/uL (ref 150–450)
RBC: 4.48 x10E6/uL (ref 3.77–5.28)
RDW: 14.1 % (ref 11.7–15.4)
WBC: 7.2 x10E3/uL (ref 3.4–10.8)

## 2024-06-20 ENCOUNTER — Other Ambulatory Visit: Payer: Self-pay

## 2024-06-22 NOTE — Progress Notes (Addendum)
 Cardiology Office Note   Date:  06/26/2024  ID:  Babs Dabbs, DOB Jun 13, 1961, MRN [id number] PCP: [name], MD  [facility] Providers Cardiologist:  Oneil Parchment, MD     History of Present Illness Brooke [name] is a 63 y.o. female type 2 diabetes, heart failure, recent admission for pyelonephritis and hydronephrosis, CAD, hypertension, PAD, hyperlipidemia, chronic Foley catheter use.   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  An echo at that time revealed severe biventricular dysfunction with a EF of 20 to 25%.  She underwent right left heart cath revealing severe multivessel CAD, not felt to be a CABG candidate secondary to poor targets, malnutrition and low LV systolic function.  She underwent a cardiac MRI revealed an EF of 24%.  She was started on GDMT.  Repeat echo in 04/2022 showed EF 55-60%, no regional wall motion abnormalities, grade 1 DD, normal RV systolic function, no significant valvular abnormalities. She had abnormal ABIs 07/2023 and underwent angiogram by Dr. Gretta with angioplasty to left below-knee popliteal artery and left posterior tibial artery on 07/29/2023.   Patient was last in the cardiology on 10/28/2023.  At that time, patient appeared euvolemic on exam.  Remained on Farxiga , metoprolol , Entresto , spironolactone , aspirin , atorvastatin , Plavix .  Patient was admitted to [facility] from 10/2 -  05/17/2024, presented to the ED with shortness of breath, fever, right flank pain, peripheral edema.  Found to have right sided hydronephrosis and pyelonephritis.  Also found to have DKA.  Treated with insulin  infusion.  Discharged on insulin .  Treat with antibiotics for pyelonephritis.  Her diuretics were held during her admission.  She was discharged on aspirin  81 mg daily, atorvastatin  80 mg daily, Plavix  75 mg daily, Entresto  49-51 mg twice daily, Lasix  20 mg daily, metoprolol  succinate 12.5 mg daily.  She presents today accompanied by her husband for follow-up.  She has been evaluated by her PCP since she was discharged from the hospital, repeat blood work revealed potassium 2.9, she states she was told to increase her potassium, and double up on her spironolactone , has not been taking her lasix  as it makes her feel bad.   She does not appear to be in any acute distress but she is clearly volume overloaded. She has pedal edema, some orthopnea.   Lab work independently reviewed: 06/15/2024 potassium 2.9; 05/15/2024 potassium 4.3, creatinine 0.91, GFR greater than 60   ROS: Review of Systems  Respiratory:  Positive for shortness of breath.   Cardiovascular:  Positive for orthopnea and leg swelling.  All other systems reviewed and are negative.    Studies Reviewed      Cardiac Studies & Procedures   ______________________________________________________________________________________________ CARDIAC CATHETERIZATION  CARDIAC CATHETERIZATION 10/09/2021  Conclusion   Prox RCA lesion is 100% stenosed.   Ost LM lesion is 60% stenosed.   Mid LAD lesion is 80% stenosed.   2nd Diag lesion is 85% stenosed.   1st Diag lesion is 99% stenosed.  Prox LAD lesion is 80% stenosed.   Mid Cx lesion is 70% stenosed.   The left ventricular ejection fraction is 25-35% by visual estimate.  1.  Severe multivessel disease. 2.  Severe LV dysfunction 3.  Fick cardiac output of 5.2 L/min and Fick cardiac  index of 3.4 L a minute per meter squared with a PA saturation of 68%. 4.  Thermodilution outputs done in triplicate demonstrated a cardiac output of 3 L/min and an index of 2 L/min/m; milrinone  was increased to 2.5 mcg per kilogram per minute. 5.  Cardiac hemodynamics as follows:  A.  Right atrial pressure 9/7 with a mean of 5 mmHg B.  RV 38/3 with an end-diastolic pressure of 18 mmHg C.  Wedge pressure of 12/11 with a mean of 10 mmHg D.  Pulmonary artery pressure of 23/12 with a mean pressure of 17 mmHg; PAPI =9/5=1.8 E.  LVEDP of .  The results were reviewed with Dr. Bensimhon.  The patient be transferred to the to heart unit for medical optimization.  No mechanical circulatory support was pursued.  Findings Coronary Findings Diagnostic  Dominance: Right  Left Main Ost LM lesion is 60% stenosed.  Left Anterior Descending There is moderate diffuse disease throughout the vessel. Prox LAD lesion is 80% stenosed. Mid LAD lesion is 80% stenosed.  First Diagonal Branch Vessel is small in size. 1st Diag lesion is 99% stenosed.  Second Diagonal Branch 2nd Diag lesion is 85% stenosed.  Left Circumflex Mid Cx lesion is 70% stenosed.  Right Coronary Artery Collaterals Dist RCA filled by collaterals from Mid Cx.  Prox RCA lesion is 100% stenosed.  Right Posterior Atrioventricular Artery Collaterals RPAV filled by collaterals from Dist Cx.  Intervention  No interventions have been documented.     ECHOCARDIOGRAM  ECHOCARDIOGRAM COMPLETE 05/01/2022  Narrative ECHOCARDIOGRAM REPORT    Patient Name:   Brooke [name] Patient Date of Exam: 05/01/2022 Medical Rec #:  [id number]      Height:       64.0 in Accession #:    [id number]     Weight:       87.0 lb Date of Birth:  18-Dec-1960      BSA:          1.373 m Patient Age:    61 years       BP:           151/77 mmHg Patient Gender: F              HR:           80 bpm. Exam Location:  Inpatient  Procedure: 2D Echo, Cardiac  Doppler and Color Doppler  Indications:    CHF  History:        Patient has prior history of Echocardiogram examinations, most recent 10/09/2021. CHF; Risk Factors:Diabetes, Hypertension and Dyslipidemia.  Sonographer:    Rome Eans RDCS (AE) Referring Phys: [id number] DANIEL R BENSIMHON  IMPRESSIONS   1. EF significantly improved since TTE done 10/09/21. Left ventricular ejection fraction, by estimation, is 55 to 60%. The left ventricle has normal function. The left ventricle has no regional wall motion abnormalities. Left ventricular diastolic parameters are consistent with Grade I diastolic dysfunction (impaired relaxation). 2. Right ventricular systolic function is normal. The right ventricular size is normal. 3. The mitral valve is abnormal. No evidence of mitral valve regurgitation. No evidence of mitral stenosis. Moderate mitral annular calcification. 4. The aortic valve was not well visualized. Aortic valve regurgitation is not visualized.  No aortic stenosis is present. 5. The inferior vena cava is normal in size with greater than 50% respiratory variability, suggesting right atrial pressure of 3 mmHg.  FINDINGS Left Ventricle: EF significantly improved since TTE done 10/09/21. Left ventricular ejection fraction, by estimation, is 55 to 60%. The left ventricle has normal function. The left ventricle has no regional wall motion abnormalities. The left ventricular internal cavity size was normal in size. There is no left ventricular hypertrophy. Left ventricular diastolic parameters are consistent with Grade I diastolic dysfunction (impaired relaxation).  Right Ventricle: The right ventricular size is normal. No increase in right ventricular wall thickness. Right ventricular systolic function is normal.  Left Atrium: Left atrial size was normal in size.  Right Atrium: Right atrial size was normal in size.  Pericardium: There is no evidence of pericardial effusion.  Mitral Valve: The mitral  valve is abnormal. There is mild thickening of the mitral valve leaflet(s). There is mild calcification of the mitral valve leaflet(s). Moderate mitral annular calcification. No evidence of mitral valve regurgitation. No evidence of mitral valve stenosis.  Tricuspid Valve: The tricuspid valve is normal in structure. Tricuspid valve regurgitation is not demonstrated. No evidence of tricuspid stenosis.  Aortic Valve: The aortic valve was not well visualized. Aortic valve regurgitation is not visualized. No aortic stenosis is present. Aortic valve mean gradient measures 2.0 mmHg. Aortic valve peak gradient measures 3.1 mmHg. Aortic valve area, by VTI measures 1.63 cm.  Pulmonic Valve: The pulmonic valve was normal in structure. Pulmonic valve regurgitation is not visualized. No evidence of pulmonic stenosis.  Aorta: The aortic root is normal in size and structure.  Venous: The inferior vena cava is normal in size with greater than 50% respiratory variability, suggesting right atrial pressure of 3 mmHg.  IAS/Shunts: No atrial level shunt detected by color flow Doppler.   LEFT VENTRICLE PLAX 2D LVIDd:         3.10 cm   Diastology LVIDs:         1.90 cm   LV e' medial:    2.94 cm/s LV PW:         1.00 cm   LV E/e' medial:  22.8 LV IVS:        1.00 cm   LV e' lateral:   7.29 cm/s LVOT diam:     1.60 cm   LV E/e' lateral: 9.2 LV SV:         28 LV SV Index:   20 LVOT Area:     2.01 cm   RIGHT VENTRICLE            IVC RV Basal diam:  2.40 cm    IVC diam: 1.30 cm RV S prime:     6.85 cm/s TAPSE (M-mode): 1.3 cm  LEFT ATRIUM             Index        RIGHT ATRIUM          Index LA diam:        2.10 cm 1.53 cm/m   RA Area:     6.11 cm LA Vol (A2C):   16.8 ml 12.23 ml/m  RA Volume:   9.42 ml  6.86 ml/m LA Vol (A4C):   12.8 ml 9.32 ml/m LA Biplane Vol: 15.9 ml 11.58 ml/m AORTIC VALVE AV Area (Vmax):    1.82 cm AV Area (Vmean):   1.71 cm AV Area (VTI):     1.63 cm AV Vmax:  88.40 cm/s AV Vmean:          58.500 cm/s AV VTI:            0.170 m AV Peak Grad:      3.1 mmHg AV Mean Grad:      2.0 mmHg LVOT Vmax:         80.20 cm/s LVOT Vmean:        49.800 cm/s LVOT VTI:          0.138 m LVOT/AV VTI ratio: 0.81  AORTA Ao Root diam: 3.20 cm Ao Asc diam:  3.20 cm  MITRAL VALVE MV Area (PHT): 2.88 cm    SHUNTS MV Decel Time: 263 msec    Systemic VTI:  0.14 m MV E velocity: 67.10 cm/s  Systemic Diam: 1.60 cm MV A velocity: 95.40 cm/s MV E/A ratio:  0.70  Maude Emmer MD Electronically signed by Maude Emmer MD Signature Date/Time: 05/01/2022/3:11:20 PM    Final        CARDIAC MRI  MR CARDIAC MORPHOLOGY W WO CONTRAST 10/14/2021  Narrative CLINICAL DATA:  Ischemic cardiomyopathy, viability  EXAM: CARDIAC MRI  TECHNIQUE: The patient was scanned on a 1.5 Tesla GE magnet. A dedicated cardiac coil was used. Functional imaging was done using Fiesta sequences. 2,3, and 4 chamber views were done to assess for RWMA's. Modified Simpson's rule using a short axis stack was used to calculate an ejection fraction on a dedicated work [employer]. The patient received 8 cc of Multihance . After 10 minutes inversion recovery sequences were used to assess for infiltration and scar tissue.  CONTRAST:  Gadavist  8 cc  FINDINGS: Small right pleural effusion, small-moderate left pericardial effusion.  Normal left ventricular size and wall thickness. Severe hypokinesis of the septal, anterior, and inferior walls. Mild-moderate hypokinesis of the lateral wall. LV EF 24%. Normal right ventricular size and low normal systolic function, EF 48%. Normal left and right atrial sizes. Trileaflet aortic valve, no significant regurgitation or stenosis. No significant mitral regurgitation.  DELAYED ENHANCEMENT IMAGING: DELAYED ENHANCEMENT IMAGING <50% wall thickness subendocardial late gadolinium enhancement (LGE) in the mid to apical  anteroseptal, inferoseptal, and inferior walls.  >50% wall thickness subendocardial LGE at the true apex.  MEASUREMENTS: MEASUREMENTS LVEDV 118 mL  LVSV 29 mL  LVEF 24%  RVEDV 71 mL RVSV 34 mL RVEF 48%  IMPRESSION: 1. Normal LV size with wall motion abnormalities as noted above, EF 24%.  2.  Normal RV size with low normal systolic function, EF 48%.  3. There is coronary disease-type LGE present. However, LGE is <50% wall thickness in all area except for the apex. Suspect there would be a good chance of improved wall motion if patient were revascularized (except at the true apex).  Brooke [name]   Electronically Signed By: Ezra Shuck M.D. On: 10/16/2021 17:38   ______________________________________________________________________________________________      Risk Assessment/Calculations           Physical Exam VS:  BP 122/85 (BP Location: Left Arm, Patient Position: Sitting, Cuff Size: Normal)   Pulse (!) 101   Ht 5' 4 (1.626 m)   Wt 122 lb 9.6 oz (55.6 kg)   LMP 11/16/2014 (Approximate)   SpO2 97%   BMI 21.04 kg/m        Wt Readings from Last 3 Encounters:  06/26/24 122 lb 9.6 oz (55.6 kg)  06/15/24 116 lb (52.6 kg)  05/17/24 184 lb 15.5 oz (83.9 kg)    GEN: Well  nourished, well developed in no acute distress NECK: No JVD; No carotid bruits CARDIAC: RRR, no murmurs, rubs, gallops RESPIRATORY: Diminished in the bases ABDOMEN: Soft, non-tender, non-distended EXTREMITIES: +3 pitting edema  ASSESSMENT AND PLAN 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 NYHA class II-III, volume overloaded weight is up 6 pounds and her legs are edematous, but no acute distress.  Discussed that with her most recent electrolyte abnormalities noted at her PCP OV, the best course of action is for her to proceed to the emergency department however she asks if we can try to check blood work first.  Will check a stat  c-Met and proBNP to see if there is any room to titrate her medications outpatient.  If not, we will contact her this afternoon and advise her to proceed to the emergency department.  Not a candidate for SGLT2 secondary to recent history of pyelonephritis and chronic Foley use.  CAD - Cath in 10/2021 showed severe multivessel disease.  Not candidate for cath due to diffuse disease and poor targets.  Anatomy not favorable for PCI.Continue aspirin  81 mg daily, Plavix  75 mg daily. Continue Lipitor  80 mg daily. Continue metoprolol  succinate 50 mg daily.  Denies chest pain.  HTN  -blood pressure is well-controlled 122/85, continue Entresto  49-51 mg twice daily, metoprolol  12.5 mg daily, spironolactone  25 mg twice daily.  HLD - not addressed at this OV, but lipids should be around 50.  Type 2 DM - Patient had recently been admitted with DKA in the setting of pyelonephritis. Followed by PCP, on insulin .  Farxiga  currently held due to recent DKA, recent UTI/pyelo       Dispo: Labs STAT CMET and proBNP. Follow up based on testing.   Signed, Delon JAYSON Hoover, NP

## 2024-06-26 ENCOUNTER — Ambulatory Visit: Attending: Cardiology | Admitting: Cardiology

## 2024-06-26 ENCOUNTER — Encounter: Payer: Self-pay | Admitting: Cardiology

## 2024-06-26 VITALS — BP 122/85 | HR 101 | Ht 64.0 in | Wt 122.6 lb

## 2024-06-26 DIAGNOSIS — I5020 Unspecified systolic (congestive) heart failure: Secondary | ICD-10-CM

## 2024-06-26 DIAGNOSIS — I739 Peripheral vascular disease, unspecified: Secondary | ICD-10-CM | POA: Diagnosis not present

## 2024-06-26 DIAGNOSIS — I10 Essential (primary) hypertension: Secondary | ICD-10-CM

## 2024-06-26 DIAGNOSIS — E785 Hyperlipidemia, unspecified: Secondary | ICD-10-CM

## 2024-06-26 DIAGNOSIS — I2510 Atherosclerotic heart disease of native coronary artery without angina pectoris: Secondary | ICD-10-CM | POA: Diagnosis not present

## 2024-06-26 NOTE — Patient Instructions (Addendum)
 Medication Instructions:  Your physician recommends that you continue on your current medications as directed. Please refer to the Current Medication list given to you today.   *If you need a refill on your cardiac medications before your next appointment, please call your pharmacy*  Lab Work: CMET ProBNP    If you have labs (blood work) drawn today and your tests are completely normal, you will receive your results only by: MyChart Message (if you have MyChart) OR A paper copy in the mail If you have any lab test that is abnormal or we need to change your treatment, we will call you to review the results.  Testing/Procedures: NONE ordered at this time of appointment   Follow-Up: At [facility], you and your health needs are our priority.  As part of our continuing mission to provide you with exceptional heart care, our providers are all part of one team.  This team includes your primary Cardiologist (physician) and Advanced Practice Providers or APPs (Physician Assistants and Nurse Practitioners) who all work together to provide you with the care you need, when you need it.  Your next appointment:   Based on testing today   Provider:     We recommend signing up for the patient portal called MyChart.  Sign up information is provided on this After Visit Summary.  MyChart is used to connect with patients for Virtual Visits (Telemedicine).  Patients are able to view lab/test results, encounter notes, upcoming appointments, etc.  Non-urgent messages can be sent to your provider as well.   To learn more about what you can do with MyChart, go to [email or web address].   Other Instructions Proceed to lab for lab work

## 2024-06-27 ENCOUNTER — Inpatient Hospital Stay (HOSPITAL_COMMUNITY)
Admission: EM | Admit: 2024-06-27 | Discharge: 2024-07-01 | DRG: 321 | Disposition: A | Discharge status: Home / Self Care | Attending: Family Medicine | Admitting: Family Medicine

## 2024-06-27 ENCOUNTER — Telehealth: Payer: Self-pay | Admitting: Cardiology

## 2024-06-27 ENCOUNTER — Other Ambulatory Visit: Payer: Self-pay

## 2024-06-27 ENCOUNTER — Encounter (HOSPITAL_COMMUNITY): Payer: Self-pay

## 2024-06-27 ENCOUNTER — Emergency Department (HOSPITAL_COMMUNITY)

## 2024-06-27 DIAGNOSIS — Z8249 Family history of ischemic heart disease and other diseases of the circulatory system: Secondary | ICD-10-CM

## 2024-06-27 DIAGNOSIS — E872 Acidosis, unspecified: Secondary | ICD-10-CM | POA: Diagnosis present

## 2024-06-27 DIAGNOSIS — R0902 Hypoxemia: Secondary | ICD-10-CM | POA: Diagnosis not present

## 2024-06-27 DIAGNOSIS — I5023 Acute on chronic systolic (congestive) heart failure: Secondary | ICD-10-CM

## 2024-06-27 DIAGNOSIS — R600 Localized edema: Secondary | ICD-10-CM | POA: Diagnosis not present

## 2024-06-27 DIAGNOSIS — Z888 Allergy status to other drugs, medicaments and biological substances status: Secondary | ICD-10-CM

## 2024-06-27 DIAGNOSIS — J9811 Atelectasis: Secondary | ICD-10-CM | POA: Diagnosis not present

## 2024-06-27 DIAGNOSIS — R54 Age-related physical debility: Secondary | ICD-10-CM | POA: Diagnosis present

## 2024-06-27 DIAGNOSIS — E46 Unspecified protein-calorie malnutrition: Secondary | ICD-10-CM | POA: Diagnosis present

## 2024-06-27 DIAGNOSIS — E1151 Type 2 diabetes mellitus with diabetic peripheral angiopathy without gangrene: Secondary | ICD-10-CM | POA: Diagnosis present

## 2024-06-27 DIAGNOSIS — R0789 Other chest pain: Secondary | ICD-10-CM | POA: Diagnosis not present

## 2024-06-27 DIAGNOSIS — Z7982 Long term (current) use of aspirin: Secondary | ICD-10-CM

## 2024-06-27 DIAGNOSIS — Z9049 Acquired absence of other specified parts of digestive tract: Secondary | ICD-10-CM

## 2024-06-27 DIAGNOSIS — I110 Hypertensive heart disease with heart failure: Principal | ICD-10-CM | POA: Diagnosis present

## 2024-06-27 DIAGNOSIS — R609 Edema, unspecified: Secondary | ICD-10-CM | POA: Diagnosis not present

## 2024-06-27 DIAGNOSIS — Z681 Body mass index (BMI) 19 or less, adult: Secondary | ICD-10-CM

## 2024-06-27 DIAGNOSIS — K219 Gastro-esophageal reflux disease without esophagitis: Secondary | ICD-10-CM | POA: Diagnosis present

## 2024-06-27 DIAGNOSIS — J90 Pleural effusion, not elsewhere classified: Secondary | ICD-10-CM | POA: Diagnosis not present

## 2024-06-27 DIAGNOSIS — Z800 Family history of malignant neoplasm of digestive organs: Secondary | ICD-10-CM

## 2024-06-27 DIAGNOSIS — Z59868 Other specified financial insecurity: Secondary | ICD-10-CM

## 2024-06-27 DIAGNOSIS — Z886 Allergy status to analgesic agent status: Secondary | ICD-10-CM

## 2024-06-27 DIAGNOSIS — I5021 Acute systolic (congestive) heart failure: Secondary | ICD-10-CM

## 2024-06-27 DIAGNOSIS — Z885 Allergy status to narcotic agent status: Secondary | ICD-10-CM

## 2024-06-27 DIAGNOSIS — R079 Chest pain, unspecified: Secondary | ICD-10-CM

## 2024-06-27 DIAGNOSIS — I5043 Acute on chronic combined systolic (congestive) and diastolic (congestive) heart failure: Secondary | ICD-10-CM | POA: Diagnosis present

## 2024-06-27 DIAGNOSIS — E7800 Pure hypercholesterolemia, unspecified: Secondary | ICD-10-CM | POA: Diagnosis present

## 2024-06-27 DIAGNOSIS — Z7902 Long term (current) use of antithrombotics/antiplatelets: Secondary | ICD-10-CM

## 2024-06-27 DIAGNOSIS — Z833 Family history of diabetes mellitus: Secondary | ICD-10-CM

## 2024-06-27 DIAGNOSIS — Z8261 Family history of arthritis: Secondary | ICD-10-CM

## 2024-06-27 DIAGNOSIS — R52 Pain, unspecified: Secondary | ICD-10-CM | POA: Diagnosis not present

## 2024-06-27 DIAGNOSIS — Z79899 Other long term (current) drug therapy: Secondary | ICD-10-CM

## 2024-06-27 DIAGNOSIS — E1165 Type 2 diabetes mellitus with hyperglycemia: Secondary | ICD-10-CM | POA: Diagnosis present

## 2024-06-27 DIAGNOSIS — I509 Heart failure, unspecified: Secondary | ICD-10-CM

## 2024-06-27 DIAGNOSIS — E876 Hypokalemia: Secondary | ICD-10-CM | POA: Diagnosis present

## 2024-06-27 DIAGNOSIS — Z955 Presence of coronary angioplasty implant and graft: Secondary | ICD-10-CM

## 2024-06-27 DIAGNOSIS — R64 Cachexia: Secondary | ICD-10-CM | POA: Diagnosis present

## 2024-06-27 DIAGNOSIS — Z794 Long term (current) use of insulin: Secondary | ICD-10-CM

## 2024-06-27 DIAGNOSIS — I2510 Atherosclerotic heart disease of native coronary artery without angina pectoris: Secondary | ICD-10-CM | POA: Diagnosis present

## 2024-06-27 LAB — CBC WITH DIFFERENTIAL/PLATELET
Abs Immature Granulocytes: 0.02 K/uL (ref 0.00–0.07)
Basophils Absolute: 0 K/uL (ref 0.0–0.1)
Basophils Relative: 0 %
Eosinophils Absolute: 0 K/uL (ref 0.0–0.5)
Eosinophils Relative: 1 %
HCT: 32.1 % — ABNORMAL LOW (ref 36.0–46.0)
Hemoglobin: 9.7 g/dL — ABNORMAL LOW (ref 12.0–15.0)
Immature Granulocytes: 0 %
Lymphocytes Relative: 24 %
Lymphs Abs: 1.8 K/uL (ref 0.7–4.0)
MCH: 24.4 pg — ABNORMAL LOW (ref 26.0–34.0)
MCHC: 30.2 g/dL (ref 30.0–36.0)
MCV: 80.7 fL (ref 80.0–100.0)
Monocytes Absolute: 0.4 K/uL (ref 0.1–1.0)
Monocytes Relative: 5 %
Neutro Abs: 5 K/uL (ref 1.7–7.7)
Neutrophils Relative %: 70 %
Platelets: 276 K/uL (ref 150–400)
RBC: 3.98 MIL/uL (ref 3.87–5.11)
RDW: 15.5 % (ref 11.5–15.5)
WBC: 7.2 K/uL (ref 4.0–10.5)
nRBC: 0 % (ref 0.0–0.2)

## 2024-06-27 LAB — COMPREHENSIVE METABOLIC PANEL WITH GFR
ALT: 8 IU/L (ref 0–32)
ALT: 10 U/L (ref 0–44)
AST: 10 IU/L (ref 0–40)
AST: 21 U/L (ref 15–41)
Albumin: 3.6 g/dL — ABNORMAL LOW (ref 3.9–4.9)
Albumin: 2.6 g/dL — ABNORMAL LOW (ref 3.5–5.0)
Alkaline Phosphatase: 119 IU/L (ref 49–135)
Alkaline Phosphatase: 86 U/L (ref 38–126)
Anion gap: 11 (ref 5–15)
BUN/Creatinine Ratio: 8 — ABNORMAL LOW (ref 12–28)
BUN: 6 mg/dL — ABNORMAL LOW (ref 8–27)
BUN: 7 mg/dL — ABNORMAL LOW (ref 8–23)
Bilirubin Total: 0.4 mg/dL (ref 0.0–1.2)
CO2: 24 mmol/L (ref 20–29)
CO2: 21 mmol/L — ABNORMAL LOW (ref 22–32)
Calcium: 9 mg/dL (ref 8.7–10.3)
Calcium: 7.5 mg/dL — ABNORMAL LOW (ref 8.9–10.3)
Chloride: 99 mmol/L (ref 96–106)
Chloride: 108 mmol/L (ref 98–111)
Creatinine, Ser: 0.76 mg/dL (ref 0.57–1.00)
Creatinine, Ser: 0.71 mg/dL (ref 0.44–1.00)
GFR, Estimated: >60 mL/min (ref >60)
Globulin, Total: 3.4 g/dL (ref 1.5–4.5)
Glucose, Bld: 206 mg/dL — ABNORMAL HIGH (ref 70–99)
Glucose: 265 mg/dL — ABNORMAL HIGH (ref 70–99)
Potassium: 4.3 mmol/L (ref 3.5–5.2)
Potassium: 3.8 mmol/L (ref 3.5–5.1)
Sodium: 136 mmol/L (ref 134–144)
Sodium: 140 mmol/L (ref 135–145)
Total Bilirubin: 1 mg/dL (ref 0.0–1.2)
Total Protein: 7 g/dL (ref 6.0–8.5)
Total Protein: 6.3 g/dL — ABNORMAL LOW (ref 6.5–8.1)
eGFR: 89 mL/min/1.73

## 2024-06-27 LAB — TROPONIN I (HIGH SENSITIVITY)
Troponin I (High Sensitivity): 10 ng/L (ref <18)
Troponin I (High Sensitivity): 11 ng/L (ref <18)

## 2024-06-27 LAB — BRAIN NATRIURETIC PEPTIDE: B Natriuretic Peptide: 1890.2 pg/mL — ABNORMAL HIGH (ref 0.0–100.0)

## 2024-06-27 LAB — PRO B NATRIURETIC PEPTIDE: NT-Pro BNP: 11873 pg/mL — ABNORMAL HIGH (ref 0–287)

## 2024-06-27 NOTE — ED Triage Notes (Signed)
 Pt from home with ems c.o chest pain, saw her cardiologist yesterday and they were concerned for fluid overload. Pt is also SOB. Pt also c.o RUQ pain, pt appears jaundice.  Pt given 324 ASA and 4mg  zofran  Iv and 1 nitroglycerin  PTA.  Pt has chronic foley catheter.

## 2024-06-27 NOTE — Telephone Encounter (Signed)
 Message sent to Delon Hoover, NP and Bernarda Ada, CMA for results review for patient.

## 2024-06-27 NOTE — ED Provider Triage Note (Signed)
 Emergency Medicine Provider Triage Evaluation Note  Brooke [name] , a 63 y.o. female  was evaluated in triage.  Pt complains of leg swelling, 6/10 chest pain located central/left chest, increased cough, and orthopnea. Cardiologist yesterday concerned for fluid overload. Increased nausea with no vomiting. Also c/o RUQ pain. Given 324 mg ASA and 1 SL nitroglycerin   .  Review of Systems  Positive: CP, SOB, cough, orthopnea, leg swelling, nausea Negative: F/c, productive cough  Physical Exam  BP 125/83 (BP Location: Right Arm)   Pulse 98   Temp 98 F (36.7 C) (Oral)   Resp 17   Ht 5' 4 (1.626 m)   Wt 49.9 kg   LMP 11/16/2014 (Approximate)   SpO2 94%   BMI 18.88 kg/m  Gen:   Awake, no distress   Resp:  Normal effort  MSK:   Moves extremities without difficulty Other:  2+ pitting edema in symmetric BL LEs, feet are cold bilaterally but palpable DP pulses.   Medical Decision Making  Medically screening exam initiated at 1:38 PM.  Appropriate orders placed.  Cherly Erno was informed that the remainder of the evaluation will be completed by another provider, this initial triage assessment does not replace that evaluation, and the importance of remaining in the ED until their evaluation is complete.  Labs/imaging ordered. Pt is HDS. She will be moved to treatment space when one becomes available.   Franklyn Sid SAILOR, MD 06/27/24 1340

## 2024-06-27 NOTE — ED Notes (Signed)
 Pt stated she is feeling slightly short of breath and requested oxygen, pt placed on 1 lpm via [place].

## 2024-06-27 NOTE — ED Notes (Signed)
 Foley BAG changed

## 2024-06-27 NOTE — Telephone Encounter (Signed)
 Pt is requesting a callback regarding results. Please advise

## 2024-06-28 ENCOUNTER — Inpatient Hospital Stay (HOSPITAL_COMMUNITY)

## 2024-06-28 ENCOUNTER — Ambulatory Visit: Payer: Self-pay | Admitting: Cardiology

## 2024-06-28 DIAGNOSIS — Z7982 Long term (current) use of aspirin: Secondary | ICD-10-CM | POA: Diagnosis not present

## 2024-06-28 DIAGNOSIS — Z7902 Long term (current) use of antithrombotics/antiplatelets: Secondary | ICD-10-CM | POA: Diagnosis not present

## 2024-06-28 DIAGNOSIS — K219 Gastro-esophageal reflux disease without esophagitis: Secondary | ICD-10-CM | POA: Diagnosis not present

## 2024-06-28 DIAGNOSIS — I509 Heart failure, unspecified: Secondary | ICD-10-CM | POA: Diagnosis not present

## 2024-06-28 DIAGNOSIS — Z8261 Family history of arthritis: Secondary | ICD-10-CM | POA: Diagnosis not present

## 2024-06-28 DIAGNOSIS — I110 Hypertensive heart disease with heart failure: Secondary | ICD-10-CM | POA: Diagnosis not present

## 2024-06-28 DIAGNOSIS — I5031 Acute diastolic (congestive) heart failure: Secondary | ICD-10-CM | POA: Diagnosis not present

## 2024-06-28 DIAGNOSIS — E876 Hypokalemia: Secondary | ICD-10-CM | POA: Diagnosis not present

## 2024-06-28 DIAGNOSIS — Z833 Family history of diabetes mellitus: Secondary | ICD-10-CM | POA: Diagnosis not present

## 2024-06-28 DIAGNOSIS — I2510 Atherosclerotic heart disease of native coronary artery without angina pectoris: Secondary | ICD-10-CM | POA: Diagnosis not present

## 2024-06-28 DIAGNOSIS — Z794 Long term (current) use of insulin: Secondary | ICD-10-CM | POA: Diagnosis not present

## 2024-06-28 DIAGNOSIS — I5023 Acute on chronic systolic (congestive) heart failure: Secondary | ICD-10-CM | POA: Diagnosis not present

## 2024-06-28 DIAGNOSIS — R0602 Shortness of breath: Secondary | ICD-10-CM | POA: Diagnosis not present

## 2024-06-28 DIAGNOSIS — Z885 Allergy status to narcotic agent status: Secondary | ICD-10-CM | POA: Diagnosis not present

## 2024-06-28 DIAGNOSIS — Z886 Allergy status to analgesic agent status: Secondary | ICD-10-CM | POA: Diagnosis not present

## 2024-06-28 DIAGNOSIS — R600 Localized edema: Secondary | ICD-10-CM | POA: Diagnosis not present

## 2024-06-28 DIAGNOSIS — Z8249 Family history of ischemic heart disease and other diseases of the circulatory system: Secondary | ICD-10-CM | POA: Diagnosis not present

## 2024-06-28 DIAGNOSIS — Z800 Family history of malignant neoplasm of digestive organs: Secondary | ICD-10-CM | POA: Diagnosis not present

## 2024-06-28 DIAGNOSIS — E1165 Type 2 diabetes mellitus with hyperglycemia: Secondary | ICD-10-CM | POA: Diagnosis not present

## 2024-06-28 DIAGNOSIS — I5021 Acute systolic (congestive) heart failure: Secondary | ICD-10-CM

## 2024-06-28 DIAGNOSIS — E872 Acidosis, unspecified: Secondary | ICD-10-CM | POA: Diagnosis not present

## 2024-06-28 DIAGNOSIS — E46 Unspecified protein-calorie malnutrition: Secondary | ICD-10-CM | POA: Diagnosis not present

## 2024-06-28 DIAGNOSIS — I5043 Acute on chronic combined systolic (congestive) and diastolic (congestive) heart failure: Secondary | ICD-10-CM | POA: Diagnosis not present

## 2024-06-28 DIAGNOSIS — R0789 Other chest pain: Secondary | ICD-10-CM | POA: Diagnosis not present

## 2024-06-28 DIAGNOSIS — E7800 Pure hypercholesterolemia, unspecified: Secondary | ICD-10-CM | POA: Diagnosis not present

## 2024-06-28 DIAGNOSIS — Z681 Body mass index (BMI) 19 or less, adult: Secondary | ICD-10-CM | POA: Diagnosis not present

## 2024-06-28 DIAGNOSIS — R64 Cachexia: Secondary | ICD-10-CM | POA: Diagnosis not present

## 2024-06-28 DIAGNOSIS — Z9049 Acquired absence of other specified parts of digestive tract: Secondary | ICD-10-CM | POA: Diagnosis not present

## 2024-06-28 DIAGNOSIS — E1151 Type 2 diabetes mellitus with diabetic peripheral angiopathy without gangrene: Secondary | ICD-10-CM | POA: Diagnosis not present

## 2024-06-28 DIAGNOSIS — Z79899 Other long term (current) drug therapy: Secondary | ICD-10-CM | POA: Diagnosis not present

## 2024-06-28 DIAGNOSIS — R54 Age-related physical debility: Secondary | ICD-10-CM | POA: Diagnosis not present

## 2024-06-28 LAB — BASIC METABOLIC PANEL WITH GFR
Anion gap: 13 (ref 5–15)
BUN: 7 mg/dL — ABNORMAL LOW (ref 8–23)
CO2: 24 mmol/L (ref 22–32)
Calcium: 9 mg/dL (ref 8.9–10.3)
Chloride: 103 mmol/L (ref 98–111)
Creatinine, Ser: 0.93 mg/dL (ref 0.44–1.00)
GFR, Estimated: >60 mL/min (ref >60)
Glucose, Bld: 86 mg/dL (ref 70–99)
Potassium: 4.3 mmol/L (ref 3.5–5.1)
Sodium: 140 mmol/L (ref 135–145)

## 2024-06-28 LAB — MAGNESIUM: Magnesium: 1.7 mg/dL (ref 1.7–2.4)

## 2024-06-28 LAB — URINALYSIS, ROUTINE W REFLEX MICROSCOPIC
Bilirubin Urine: NEGATIVE
Glucose, UA: NEGATIVE mg/dL
Ketones, ur: NEGATIVE mg/dL
Nitrite: NEGATIVE
Protein, ur: NEGATIVE mg/dL
Specific Gravity, Urine: 1.004 — ABNORMAL LOW (ref 1.005–1.030)
pH: 7 (ref 5.0–8.0)

## 2024-06-28 LAB — ECHOCARDIOGRAM COMPLETE
Area-P 1/2: 4.65 cm2
Calc EF: 24.1 %
Height: 64 in
S' Lateral: 4.2 cm
Single Plane A2C EF: 17.4 %
Single Plane A4C EF: 34.9 %
Weight: 1760 [oz_av]

## 2024-06-28 LAB — CBC
HCT: 33.8 % — ABNORMAL LOW (ref 36.0–46.0)
Hemoglobin: 10.5 g/dL — ABNORMAL LOW (ref 12.0–15.0)
MCH: 24.8 pg — ABNORMAL LOW (ref 26.0–34.0)
MCHC: 31.1 g/dL (ref 30.0–36.0)
MCV: 79.7 fL — ABNORMAL LOW (ref 80.0–100.0)
Platelets: 338 K/uL (ref 150–400)
RBC: 4.24 MIL/uL (ref 3.87–5.11)
RDW: 15.6 % — ABNORMAL HIGH (ref 11.5–15.5)
WBC: 8.1 K/uL (ref 4.0–10.5)
nRBC: 0 % (ref 0.0–0.2)

## 2024-06-28 LAB — CBG MONITORING, ED
Glucose-Capillary: 132 mg/dL — ABNORMAL HIGH (ref 70–99)
Glucose-Capillary: 60 mg/dL — ABNORMAL LOW (ref 70–99)
Glucose-Capillary: 116 mg/dL — ABNORMAL HIGH (ref 70–99)
Glucose-Capillary: 107 mg/dL — ABNORMAL HIGH (ref 70–99)

## 2024-06-28 LAB — GLUCOSE, CAPILLARY
Glucose-Capillary: 104 mg/dL — ABNORMAL HIGH (ref 70–99)
Glucose-Capillary: 140 mg/dL — ABNORMAL HIGH (ref 70–99)

## 2024-06-28 LAB — PHOSPHORUS: Phosphorus: 3.2 mg/dL (ref 2.5–4.6)

## 2024-06-28 MED ORDER — INSULIN ASPART 100 UNIT/ML IJ SOLN
0.0000 [IU] | Freq: Three times a day (TID) | INTRAMUSCULAR | Status: DC
  Administered 2024-06-30: 2 [IU] via SUBCUTANEOUS
  Filled 2024-06-28: qty 2

## 2024-06-28 MED ORDER — POTASSIUM CHLORIDE 10 MEQ/100ML IV SOLN
10.0000 meq | Freq: Once | INTRAVENOUS | Status: AC
  Administered 2024-06-28: 10 meq via INTRAVENOUS
  Filled 2024-06-28: qty 100

## 2024-06-28 MED ORDER — INSULIN ASPART 100 UNIT/ML IJ SOLN
0.0000 [IU] | Freq: Every day | INTRAMUSCULAR | Status: DC
  Filled 2024-06-28: qty 3

## 2024-06-28 MED ORDER — PROCHLORPERAZINE EDISYLATE 10 MG/2ML IJ SOLN: 5.0000 mg | Freq: Four times a day (QID) | INTRAMUSCULAR | Status: DC | PRN

## 2024-06-28 MED ORDER — INSULIN ASPART 100 UNIT/ML IJ SOLN
3.0000 [IU] | Freq: Three times a day (TID) | INTRAMUSCULAR | Status: DC
  Administered 2024-06-28 (×2): 3 [IU] via SUBCUTANEOUS
  Filled 2024-06-28 (×2): qty 3

## 2024-06-28 MED ORDER — MELATONIN 5 MG PO TABS: 5.0000 mg | ORAL_TABLET | Freq: Every evening | ORAL | Status: DC | PRN

## 2024-06-28 MED ORDER — PERFLUTREN LIPID MICROSPHERE
1.0000 mL | INTRAVENOUS | Status: AC | PRN
  Administered 2024-06-28: 2 mL via INTRAVENOUS

## 2024-06-28 MED ORDER — ASPIRIN 81 MG PO CHEW
81.0000 mg | CHEWABLE_TABLET | Freq: Every day | ORAL | Status: DC
  Administered 2024-06-28 – 2024-06-30 (×4): 81 mg via ORAL
  Filled 2024-06-28 (×4): qty 1

## 2024-06-28 MED ORDER — FUROSEMIDE 10 MG/ML IJ SOLN
20.0000 mg | Freq: Two times a day (BID) | INTRAMUSCULAR | Status: AC
  Administered 2024-06-28 (×2): 20 mg via INTRAVENOUS
  Filled 2024-06-28 (×2): qty 2

## 2024-06-28 MED ORDER — FUROSEMIDE 10 MG/ML IJ SOLN
40.0000 mg | Freq: Two times a day (BID) | INTRAMUSCULAR | Status: DC
  Administered 2024-06-29: 40 mg via INTRAVENOUS
  Filled 2024-06-28: qty 4

## 2024-06-28 MED ORDER — FUROSEMIDE 10 MG/ML IJ SOLN
40.0000 mg | Freq: Once | INTRAMUSCULAR | Status: AC
  Administered 2024-06-28: 40 mg via INTRAVENOUS
  Filled 2024-06-28: qty 4

## 2024-06-28 MED ORDER — POLYETHYLENE GLYCOL 3350 17 G PO PACK: 17.0000 g | PACK | Freq: Every day | ORAL | Status: DC | PRN

## 2024-06-28 MED ORDER — ATORVASTATIN CALCIUM 80 MG PO TABS
80.0000 mg | ORAL_TABLET | Freq: Every day | ORAL | Status: DC
  Administered 2024-06-28 – 2024-07-01 (×4): 80 mg via ORAL
  Filled 2024-06-28: qty 2
  Filled 2024-06-28 (×3): qty 1

## 2024-06-28 MED ORDER — ENOXAPARIN SODIUM 40 MG/0.4ML IJ SOSY
40.0000 mg | PREFILLED_SYRINGE | Freq: Every day | INTRAMUSCULAR | Status: DC
  Administered 2024-06-28 – 2024-06-29 (×2): 40 mg via SUBCUTANEOUS
  Filled 2024-06-28 (×2): qty 0.4

## 2024-06-28 MED ORDER — INSULIN GLARGINE-YFGN 100 UNIT/ML ~~LOC~~ SOLN
5.0000 [IU] | Freq: Two times a day (BID) | SUBCUTANEOUS | Status: DC
  Administered 2024-06-28 – 2024-07-01 (×4): 5 [IU] via SUBCUTANEOUS
  Filled 2024-06-28 (×10): qty 0.05

## 2024-06-28 MED ORDER — ACETAMINOPHEN 500 MG PO TABS
500.0000 mg | ORAL_TABLET | Freq: Four times a day (QID) | ORAL | Status: AC | PRN
  Administered 2024-06-30 – 2024-07-01 (×4): 500 mg via ORAL
  Filled 2024-06-28 (×4): qty 1

## 2024-06-28 MED ORDER — CLOPIDOGREL BISULFATE 75 MG PO TABS
75.0000 mg | ORAL_TABLET | Freq: Every day | ORAL | Status: DC
  Administered 2024-06-28 – 2024-07-01 (×4): 75 mg via ORAL
  Filled 2024-06-28 (×5): qty 1

## 2024-06-28 MED ORDER — METOPROLOL SUCCINATE ER 25 MG PO TB24
12.5000 mg | ORAL_TABLET | Freq: Every day | ORAL | Status: DC
  Administered 2024-06-28 – 2024-06-29 (×2): 12.5 mg via ORAL
  Filled 2024-06-28 (×2): qty 1

## 2024-06-28 NOTE — Progress Notes (Signed)
  Echocardiogram 2D Echocardiogram has been performed.  Tinnie FORBES Gosling RDCS 06/28/2024, 12:36 PM

## 2024-06-28 NOTE — Consult Note (Addendum)
 Cardiology Consultation   Patient ID: Brooke [name] MRN: [id number]; DOB: 05-Jun-1961  Admit date: 06/27/2024 Date of Consult: 06/28/2024  PCP:  [name], MD   [facility] Providers Cardiologist:  Oneil Parchment, MD        Patient Profile: Brooke [name] is a 63 y.o. female with a hx of hypertension, hyperlipidemia, type 2 diabetes,PAD,  severe multivessel CAD, and HFrEF who is being seen 06/28/2024 for the evaluation of heart failure at the request of Pokhrel Laxman.  History of Present Illness: [name] is a 63 year old female with prior cardiac history listed below  The patient was hospitalized for heart failure on 10/2021.  Echo showed a reduced LVEF of 20 to 25%, global hypokinesis, and G2 DD.  A cardiac catheterization was done and showed severe multivessel CAD with CTO of the RCA.  The patient was felt to be a poor surgical candidate due to lack of targets and malnutrition.  The patient went into cardiogenic shock requiring milrinone  and was diuresed with Lasix .  A cardiac MRI was done that showed that the patient had a reduced LVEF of 24%, normal RV systolic function, and LGE consistent with the patient's CAD.  The patient was started on GDMT.  A follow-up echo done on 04/2022 showed a normal LVEF of 55 to 60%, no RWMA, G1 DD, moderate MAC, and grossly normal valve function.  Carotid ultrasound on 10/2022 showed Bilateral carotid bifurcation plaque resulting in less than 50% diameter ICA stenosis.  On 06/2023 the patient had abnormal ABIs.  On 07/2023 the patient underwent an angiogram by Dr. Gretta and received angioplasty to the left popliteal artery below the knee.  The patient later received angioplasty to the left posterior tibial artery.  When ABIs were repeated on 08/2023 they had improved from the previous ones.  On 05/2024 the patient was admitted to the emergency department at [facility] for fever, shortness of breath, left flank pain, and peripheral  edema.  Patient was found to have pyelonephritis, hydronephrosis, and DKA.  The patient was discharged home on insulin  and treated with antibiotics for her pyelonephritis.  An outpatient follow-up with her PCP she was found to have hypokalemia.  Because of this her lasix  was stopped and Aldactone  was doubled.  The patient was seen for outpatient follow-up by Delon Hoover on 06/26/2024.  At that appointment the patient reported shortness of breath, orthopnea, and lower extremity edema.  The patient appeared volume up.  The patient's weight was 55.6 kg's.  The patient was recommended to go to the emergency department.  On 06/27/2024 the patient presented to the emergency department for shortness of breath, orthopnea, and lower extremity edema.  In the emergency department the patient received 1 dose of 40 mg IV Lasix  and 1 of 20 mg.  The patient was also started on IV potassium.  On interview the patient reported that since October she has had worsening lower extremity edema, orthopnea, shortness of breath, cough, dyspnea on exertion, chest discomfort, and abdominal swelling.  Stated that she still has some abdominal pain but that it is significantly better than when she was hospitalized with a pyelonephritis on 05/2024.  Stated that she gets short of breath walking around [place] and requires a cart.  Stated was prescribed 20 mg oral Lasix  daily but did not help her symptoms.  Labs showed elevated BNP of 1890, high-sensitivity troponin 10 > 11, potassium of  4.3, sodium of 140, creatinine of 0.93, magnesium  of 1.7, WBC count of 8.1, and  anemia with a hemoglobin of 10.5.  Chest x-ray showed Bilateral pleural effusions with associated atelectasis, left greater than right.  EKG showed normal sinus rhythm with a rate of 97, and lateral ST depression seen on prior EKGs.  Echocardiogram showed a low normal LVEF of 20 to 25%, global hypokinesis, G2 DD, grossly normal valve function, and IVC with less than  50% respiratory variability indicating elevated right atrial pressure of greater than 15 mmHg.  Past Medical History:  Diagnosis Date   Abdominal pain, left lower quadrant 12/17/2015   Anemia    Anxiety 07/01/2016   Carotid artery disease 10/27/2022   Chest pain, unspecified 10/05/2003   CHF (congestive heart failure) (HCC)    Chronic systolic heart failure (HCC) 02/20/2022   Depression, major, recurrent, moderate (HCC) 07/31/2005   Diabetes mellitus without complication (HCC) 09/08/2006   Endometriosis 03/28/1996   GERD (gastroesophageal reflux disease)    Heart murmur    History of colonic polyps 08/07/2015   20 mm removed 05/10/15 by Dr. Kristie     PAD (peripheral artery disease)    Unspecified constipation 06/24/2004    Past Surgical History:  Procedure Laterality Date   ABDOMINAL AORTOGRAM W/LOWER EXTREMITY Left 07/29/2023   Procedure: ABDOMINAL AORTOGRAM W/LOWER EXTREMITY;  Surgeon: Gretta Lonni PARAS, MD;  Location: [facility] INVASIVE CV LAB;  Service: Cardiovascular;  Laterality: Left;   APPENDECTOMY  08/10/1994   Dr. Buford   BREAST SURGERY Bilateral 08/10/1994   Dr. Ardeen   calcifications removed   CYSTOSCOPY W/ RETROGRADES Right 05/01/2024   Procedure: CYSTOSCOPY, WITH RETROGRADE PYELOGRAM;  Surgeon: Selma Donnice SAUNDERS, MD;  Location: [facility] ORS;  Service: Urology;  Laterality: Right;   CYSTOSCOPY W/ URETERAL STENT PLACEMENT Right 03/02/2024   Procedure: CYSTOSCOPY, WITH RETROGRADE PYELOGRAM AND URETERAL STENT INSERTION;  Surgeon: Selma Donnice SAUNDERS, MD;  Location: [facility] OR;  Service: Urology;  Laterality: Right;   CYSTOSCOPY WITH BIOPSY N/A 05/01/2024   Procedure: CYSTOSCOPY, WITH URETERAL BIOPSY;  Surgeon: Selma Donnice SAUNDERS, MD;  Location: [facility] ORS;  Service: Urology;  Laterality: N/A;   CYSTOSCOPY/URETEROSCOPY/HOLMIUM LASER/STENT PLACEMENT Right 05/01/2024   Procedure: CYSTOSCOPY/URETEROSCOPY/EXCHANGE;  Surgeon: Selma Donnice SAUNDERS, MD;  Location: [facility] ORS;  Service: Urology;  Laterality: Right;    ESOPHAGEAL MANOMETRY N/A 03/23/2023   Procedure: ESOPHAGEAL MANOMETRY (EM);  Surgeon: Leigh Elspeth SQUIBB, MD;  Location: [facility] ENDOSCOPY;  Service: Gastroenterology;  Laterality: N/A;   INGUINAL HERNIA REPAIR     OVARIAN CYST REMOVAL     PERIPHERAL VASCULAR BALLOON ANGIOPLASTY Left 07/29/2023   Procedure: PERIPHERAL VASCULAR BALLOON ANGIOPLASTY;  Surgeon: Gretta Lonni PARAS, MD;  Location: [facility] INVASIVE CV LAB;  Service: Cardiovascular;  Laterality: Left;   RIGHT/LEFT HEART CATH AND CORONARY ANGIOGRAPHY N/A 10/09/2021   Procedure: RIGHT/LEFT HEART CATH AND CORONARY ANGIOGRAPHY;  Surgeon: Wendel Lurena POUR, MD;  Location: [facility] INVASIVE CV LAB;  Service: Cardiovascular;  Laterality: N/A;   TOOTH EXTRACTION N/A 07/28/2022   Procedure: EXTRACTION ALL REMAING TEETH THREE, FOUR, FIVE, SIX, SEVEN, EIGHT, NINE, TEN, ELEVEN, TWELVE, TWENTY ONE, TWENTY TWO, TWENTY THREE, TWENTY FOUR, TWENTY FIVE, TWENTY SIX, TWENTY SEVEN, TWENTY EIGHT, TWENTY NINE, THIRTY, AVEOLOPLASTY;  Surgeon: Sheryle Hamilton, DMD;  Location: [facility] OR;  Service: Oral Surgery;  Laterality: N/A;     Home Medications:  Prior to Admission medications   Medication Sig Start Date End Date Taking? Authorizing Provider  acetaminophen  (TYLENOL ) 500 MG tablet Take 1,000 mg by mouth every 6 (six) hours as needed for headache or fever (pain).   Yes [provider]  aspirin  81 MG chewable tablet Chew 1 tablet (81 mg total) by mouth daily. Patient taking differently: Chew 81 mg by mouth at bedtime. 12/15/21  Yes Bensimhon, Toribio SAUNDERS, MD  atorvastatin  (LIPITOR ) 80 MG tablet Take 1 tablet (80 mg total) by mouth daily. Patient taking differently: Take 80 mg by mouth at bedtime. 03/15/24  Yes Newlin, Enobong, MD  clopidogrel  (PLAVIX ) 75 MG tablet Take 1 tablet (75 mg total) by mouth daily. 03/13/24  Yes Newlin, Enobong, MD  furosemide  (LASIX ) 20 MG tablet Take 1 tablet (20 mg total) by mouth daily. 05/18/24  Yes Ghimire, Kuber, MD  insulin  aspart (NOVOLOG ) 100  UNIT/ML FlexPen Inject 3-14 Units into the skin 3 (three) times daily with meals. If eating and Blood Glucose (BG) 80 or higher inject 3 units for meal coverage and add correction dose per scale. If not eating, correction dose only. BG <150= 0 unit; BG 150-200= 1 unit; BG 201-250= 3 unit; BG 251-300= 5 unit; BG 301-350= 7 unit; BG 351-400= 9 unit; BG >400= 11 unit and Call Primary Care. 03/05/24  Yes Danford, Lonni SQUIBB, MD  insulin  glargine (LANTUS ) 100 UNIT/ML Solostar Pen Inject 20 Units into the skin daily. Patient taking differently: Inject 20-21 Units into the skin at bedtime. 03/15/24  Yes Newlin, Enobong, MD  Iron , Ferrous Sulfate , 325 (65 Fe) MG TABS Take 325 mg by mouth daily. 03/16/24  Yes Newlin, Enobong, MD  metoprolol  succinate (TOPROL -XL) 25 MG 24 hr tablet Take 0.5 tablets (12.5 mg total) by mouth daily. Take with or immediately following a meal. 03/15/24  Yes Newlin, Enobong, MD  potassium chloride  SA (KLOR-CON  M) 20 MEQ tablet Take 1 tablet (20 mEq total) by mouth 2 (two) times daily. 06/16/24  Yes Newlin, Enobong, MD  sacubitril -valsartan  (ENTRESTO ) 49-51 MG Take 1 tablet by mouth 2 (two) times daily. 05/06/23  Yes Newlin, Enobong, MD  spironolactone  (ALDACTONE ) 25 MG tablet Take 25 mg by mouth 2 (two) times daily.   Yes [provider]  vitamin B-12 (CYANOCOBALAMIN) 1000 MCG tablet Take 3,000 mcg by mouth daily.   Yes [provider]  Continuous Glucose Receiver (DEXCOM G7 RECEIVER) DEVI Apply as directed. Patient not taking: Reported on 06/26/2024 03/05/24   Jonel Lonni SQUIBB, MD  Glucose Blood (BLOOD GLUCOSE TEST STRIPS) STRP 1 each by Does not apply route 3 (three) times daily. Use as directed to check blood sugar. May dispense any manufacturer covered by patient's insurance and fits patient's device. Patient not taking: Reported on 06/26/2024 03/05/24   Jonel Lonni SQUIBB, MD  Insulin  Pen Needle (PEN NEEDLES) 31G X 5 MM MISC Use 3 times a day. Patient not  taking: Reported on 06/28/2024 03/05/24   Jonel Lonni SQUIBB, MD  Lancet Device MISC 1 each by Does not apply route 3 (three) times daily. May dispense any manufacturer covered by patient's insurance. Patient not taking: Reported on 06/26/2024 03/05/24   Jonel Lonni SQUIBB, MD    Scheduled Meds:  aspirin   81 mg Oral QHS   atorvastatin   80 mg Oral Daily   clopidogrel   75 mg Oral Daily   enoxaparin  (LOVENOX ) injection  40 mg Subcutaneous Daily   furosemide   20 mg Intravenous BID   insulin  aspart  0-5 Units Subcutaneous QHS   insulin  aspart  0-9 Units Subcutaneous TID WC   insulin  aspart  3 Units Subcutaneous TID WC   insulin  glargine-yfgn  5 Units Subcutaneous BID   metoprolol  succinate  12.5 mg Oral Daily   Continuous  Infusions:  PRN Meds: acetaminophen , melatonin, perflutren lipid microspheres (DEFINITY) IV suspension, polyethylene glycol, prochlorperazine  Allergies:    Allergies  Allergen Reactions   Bayer Aspirin  [Aspirin ] Other (See Comments)    Stomach pain occurs with 325mg  dose, able to tolerate 81mg .   Darvon-N [Propoxyphene] Rash and Other (See Comments)    Darvocet    Ery-Tab [Erythromycin] Nausea And Vomiting   Glucophage  [Metformin ] Diarrhea, Nausea Only and Other (See Comments)    General GI intolerance    Motrin [Ibuprofen] Other (See Comments)    Stomach pain   Neurontin [Gabapentin] Other (See Comments)    Suicidal ideations     Social History:   Social History   Socioeconomic History   Marital status: Married    Spouse name: Not on file   Number of children: 1   Years of education: Not on file   Highest education level: Not on file  Occupational History   Not on file  Tobacco Use   Smoking status: Never   Smokeless tobacco: Never  Vaping Use   Vaping status: Never Used  Substance and Sexual Activity   Alcohol use: No   Drug use: No   Sexual activity: Not Currently  Other Topics Concern   Not on file  Social History Narrative   Not on  file   Social Drivers of Health   Financial Resource Strain: Medium Risk (10/31/2021)   Overall Financial Resource Strain (CARDIA)    Difficulty of Paying Living Expenses: Somewhat hard  Food Insecurity: No Food Insecurity (05/12/2024)   Hunger Vital Sign    Worried About Running Out of Food in the Last Year: Never true    Ran Out of Food in the Last Year: Never true  Transportation Needs: No Transportation Needs (05/12/2024)   PRAPARE - Administrator, Civil Service (Medical): No    Lack of Transportation (Non-Medical): No  Physical Activity: Not on file  Stress: Not on file  Social Connections: Not on file  Intimate Partner Violence: Not At Risk (05/12/2024)   Humiliation, Afraid, Rape, and Kick questionnaire    Fear of Current or Ex-Partner: No    Emotionally Abused: No    Physically Abused: No    Sexually Abused: No    Family History:    Family History  Problem Relation Age of Onset   Diabetes Mother    Hypertension Mother    Arthritis Mother        RA   Pancreatic cancer Father    Stomach cancer Neg Hx    Colon cancer Neg Hx    Esophageal cancer Neg Hx    Rectal cancer Neg Hx      ROS:  Please see the history of present illness.   All other ROS reviewed and negative.     Physical Exam/Data: Vitals:   06/28/24 0314 06/28/24 0506 06/28/24 1055 06/28/24 1407  BP:  110/79 121/68 93/74  Pulse:  89 82 81  Resp:  (!) 24 18 16   Temp: 97.9 F (36.6 C) (!) 97.1 F (36.2 C) (!) 96.4 F (35.8 C) 97.9 F (36.6 C)  TempSrc: Oral Oral Oral Oral  SpO2:  100% 100% 100%  Weight:      Height:        Intake/Output Summary (Last 24 hours) at 06/28/2024 1421 Last data filed at 06/28/2024 0509 Gross per 24 hour  Intake --  Output 1200 ml  Net -1200 ml      06/27/2024  1:28 PM 06/26/2024    8:10 AM 06/15/2024   10:23 AM  Last 3 Weights  Weight (lbs) 110 lb 122 lb 9.6 oz 116 lb  Weight (kg) 49.896 kg 55.611 kg 52.617 kg     Body mass index is 18.88  kg/m.  General: Malnourished and cachectic, in no acute distress HEENT: normal Neck: Positive JVD Vascular: No carotid bruits; Distal pulses 2+ bilaterally on upper extremities Cardiac:  normal S1, S2; RRR; no murmur  Lungs: Bibasilar crackles Abd: soft, slightly tender, no hepatomegaly.  Ext: 2+ bilateral lower extremity edema.  Lower extremities are erythematous. Musculoskeletal:  No deformities Skin: warm and dry  Neuro:  no focal abnormalities noted Psych:  Normal affect   EKG:  The EKG was personally reviewed and demonstrates:  normal sinus rhythm with a rate of 97, and lateral ST depression seen on prior EKGs. Telemetry:  Telemetry was personally reviewed and demonstrates: Normal sinus rhythm with heart rate in the 70s to 80s  Relevant CV Studies: TTE on 06/28/2024 showed IMPRESSIONS     1. Left ventricular ejection fraction, by estimation, is 20 to 25%. The  left ventricle has severely decreased function. The left ventricle  demonstrates global hypokinesis. The left ventricular internal cavity size  was mildly dilated. Left ventricular  diastolic parameters are consistent with Grade II diastolic dysfunction  (pseudonormalization).   2. Right ventricular systolic function is normal. The right ventricular  size is normal. Tricuspid regurgitation signal is inadequate for assessing  PA pressure.   3. Left atrial size was mildly dilated.   4. A small pericardial effusion is present. The pericardial effusion is  localized near the right atrium. There is no evidence of cardiac  tamponade.   5. The mitral valve is normal in structure. Trivial mitral valve  regurgitation. No evidence of mitral stenosis.   6. The aortic valve is normal in structure. Aortic valve regurgitation is  trivial. No aortic stenosis is present.   7. The inferior vena cava is dilated in size with <50% respiratory  variability, suggesting right atrial pressure of 15 mmHg.   Laboratory Data: High  Sensitivity Troponin:   Recent Labs  Lab 06/27/24 1336 06/27/24 1558  TROPONINIHS 10 11     Chemistry Recent Labs  Lab 06/26/24 0903 06/27/24 1336 06/28/24 0515  NA 136 140 140  K 4.3 3.8 4.3  CL 99 108 103  CO2 24 21* 24  GLUCOSE 265* 206* 86  BUN 6* 7* 7*  CREATININE 0.76 0.71 0.93  CALCIUM  9.0 7.5* 9.0  MG  --   --  1.7  GFRNONAA  --  >60 >60  ANIONGAP  --  11 13    Recent Labs  Lab 06/26/24 0903 06/27/24 1336  PROT 7.0 6.3*  ALBUMIN 3.6* 2.6*  AST 10 21  ALT 8 10  ALKPHOS 119 86  BILITOT 0.4 1.0   Lipids No results for input(s): CHOL, TRIG, HDL, LABVLDL, LDLCALC, CHOLHDL in the last 168 hours.  Hematology Recent Labs  Lab 06/27/24 1336 06/28/24 0515  WBC 7.2 8.1  RBC 3.98 4.24  HGB 9.7* 10.5*  HCT 32.1* 33.8*  MCV 80.7 79.7*  MCH 24.4* 24.8*  MCHC 30.2 31.1  RDW 15.5 15.6*  PLT 276 338   Thyroid No results for input(s): TSH, FREET4 in the last 168 hours.  BNP Recent Labs  Lab 06/26/24 0903 06/27/24 1336  BNP  --  1,890.2*  PROBNP 11,873*  --     DDimer No results for  input(s): DDIMER in the last 168 hours.  Radiology/Studies:  DG Chest 1 View Result Date: 06/27/2024 EXAM: 1 VIEW(S) XRAY OF THE CHEST 06/27/2024 02:05:00 PM COMPARISON: 05/13/2024 CLINICAL HISTORY: Chest pain. FINDINGS: LUNGS AND PLEURA: Bilateral pleural effusions are noted with associated atelectasis, left greater than right. No pneumothorax. HEART AND MEDIASTINUM: No acute abnormality of the cardiac and mediastinal silhouettes. BONES AND SOFT TISSUES: No acute osseous abnormality. IMPRESSION: 1. Bilateral pleural effusions with associated atelectasis, left greater than right. Electronically signed by: Lynwood Seip MD 06/27/2024 02:25 PM EST RP Workstation: [id number]     Assessment and Plan: Allicia Culley is a 63 y.o. female with a hx of hypertension, hyperlipidemia, type 2 diabetes, severe multivessel CAD, and HFrEF who is being seen 06/28/2024 for the  evaluation of heart failure at the request of Pokhrel Laxman.  Acute on chronic combined systolic and diastolic heart failure. The patient reported that since October she has had worsening lower extremity edema, orthopnea, shortness of breath, cough, dyspnea on exertion, chest discomfort, and abdominal swelling.  Patient reported that she is feeling better and her legs are less swollen today.  Stated that she does continue to have some orthopnea.  Prior to admission the patient was on oral Lasix  20 mg daily, Aldactone  25 mg daily, and Entresto  49-51 twice daily.  - Labs showed elevated BNP of 1890, creatinine of 0.93. - Chest x-ray showed Bilateral pleural effusions with associated atelectasis, left greater than right. - Echocardiogram today showed a low normal LVEF of 20 to 25%, global hypokinesis, G2 DD, grossly normal valve function, and IVC with less than 50% respiratory variability indicating elevated right atrial pressure of greater than 15 mmHg. Since arriving to the hospital the patient received 1 dose of 40 mg IV Lasix  and 1 of 20 mg.  Is currently on Lasix  20 mg twice daily.  I's and O's are documented as being net out 1.2 L but appeared to be incomplete.  The patient's weight at the outpatient follow-up was 55 KGs her weight is down to 49 kg's.  On 7/25 it appears that the patient had a weight of 43 KGs.  I suspect this is the patient's baseline weight. patient still appears to be slightly volume up on exam Continue strict I's and O's Continue daily weights Increase Lasix  to 40 mg twice daily tomorrow GDMT -GDMT is limited by the patient's blood pressure.  Will prioritize diuresis for now. Continue metoprolol  succinate 12.5 mg daily Will avoid SGLT2 for now given that patient was recently hospitalized for pyelonephritis and has a Foley catheter in place.   Severe multivessel CAD PAD Hyperlipidemia A cardiac catheterization was done on 10/2021 that showed severe multivessel CAD with CTO  of the RCA.  The patient was felt to be a poor surgical candidate due to lack of targets and malnutrition. On 06/2023 the patient had abnormal ABIs.  On 07/2023 the patient underwent an angiogram by Dr. Gretta and received angioplasty to the left popliteal artery below the knee.  The patient later received angioplasty to the left posterior tibial artery.  When ABIs were repeated on 08/2023 they had improved from the previous ones. Continue aspirin  81 mg daily Continue clopidogrel  75 mg daily Continue atorvastatin  80 mg daily Order LPA and Lipid panel   Type 2 diabetes Is poorly controlled. Patient recently hospitalized for DKA.  Hemoglobin A1c on 04/2024 was 10.7. On insulin . Management per primary.   Otherwise management per primary     Risk Assessment/Risk Scores:  [place]  Heart Association (NYHA) Functional Class NYHA Class III       For questions or updates, please contact [facility] Please consult [email or web address] for contact info under     Signed, Morse Clause, PA-C  06/28/2024 2:21 PM

## 2024-06-28 NOTE — H&P (Signed)
 History and Physical  Brooke [name] FMW:[id number] DOB: April 29, 1961 DOA: 06/27/2024  Referring physician: Dr. Lorette, EDP  PCP: [name], MD  Outpatient Specialists: Cardiology. Patient coming from: Home.  Chief Complaint: Chest pain, fluid overload, shortness of breath.  HPI: Brooke [name] is a 63 y.o. female with medical history significant for type 2 diabetes, hypertension, hyperlipidemia, coronary artery disease, peripheral artery disease, chronic HFpEF chronic indwelling Foley catheter use, history of dysphagia status post esophagus dilatation on 10/22/2022 by [facility], who presents to the ER with complaints of intermittent chest pain for the past 2 days and fluid overload.  Associated with exertional shortness of breath and bilateral lower extremity edema.  States she does not use salt in her diet and has been compliant with her home diuretic.  She describes her chest pain as 7-8 out of 10, thumping pain under her left breast, and radiating to her left shoulder.  Lasting a few minutes.  Improved after sitting down.  Worsened after walking up the steps.  Sublingual nitroglycerin  also helps.  In the ER, vital signs were stable.  No evidence of acute ischemia on 12-lead EKG.  High-sensitivity troponin negative x 2.  Serum glucose over 200.  BNP greater than 1800 and volume overload on exam.  Chest x-ray revealed bilateral pleural effusions, left greater than right.  The patient received 1 dose of IV Lasix  40 mg x 1.  EDP requested admission for further diuresing.  Admitted by [facility], hospitalist service.  ED Course: Temperature 97.7.  BP 135/91, pulse 94, respiratory rate 18, O2 saturation 97% on 1 L nasal cannula.  Review of Systems: Review of systems as noted in the HPI. All other systems reviewed and are negative.   Past Medical History:  Diagnosis Date   Abdominal pain, left lower quadrant 12/17/2015   Anemia    Anxiety 07/01/2016   Carotid artery disease 10/27/2022   Chest  pain, unspecified 10/05/2003   CHF (congestive heart failure) (HCC)    Chronic systolic heart failure (HCC) 02/20/2022   Depression, major, recurrent, moderate (HCC) 07/31/2005   Diabetes mellitus without complication (HCC) 09/08/2006   Endometriosis 03/28/1996   GERD (gastroesophageal reflux disease)    Heart murmur    History of colonic polyps 08/07/2015   20 mm removed 05/10/15 by Dr. Kristie     PAD (peripheral artery disease)    Unspecified constipation 06/24/2004   Past Surgical History:  Procedure Laterality Date   ABDOMINAL AORTOGRAM W/LOWER EXTREMITY Left 07/29/2023   Procedure: ABDOMINAL AORTOGRAM W/LOWER EXTREMITY;  Surgeon: Gretta Lonni PARAS, MD;  Location: [facility] INVASIVE CV LAB;  Service: Cardiovascular;  Laterality: Left;   APPENDECTOMY  08/10/1994   Dr. Buford   BREAST SURGERY Bilateral 08/10/1994   Dr. Ardeen   calcifications removed   CYSTOSCOPY W/ RETROGRADES Right 05/01/2024   Procedure: CYSTOSCOPY, WITH RETROGRADE PYELOGRAM;  Surgeon: Selma Donnice SAUNDERS, MD;  Location: [facility] ORS;  Service: Urology;  Laterality: Right;   CYSTOSCOPY W/ URETERAL STENT PLACEMENT Right 03/02/2024   Procedure: CYSTOSCOPY, WITH RETROGRADE PYELOGRAM AND URETERAL STENT INSERTION;  Surgeon: Selma Donnice SAUNDERS, MD;  Location: [facility] OR;  Service: Urology;  Laterality: Right;   CYSTOSCOPY WITH BIOPSY N/A 05/01/2024   Procedure: CYSTOSCOPY, WITH URETERAL BIOPSY;  Surgeon: Selma Donnice SAUNDERS, MD;  Location: [facility] ORS;  Service: Urology;  Laterality: N/A;   CYSTOSCOPY/URETEROSCOPY/HOLMIUM LASER/STENT PLACEMENT Right 05/01/2024   Procedure: CYSTOSCOPY/URETEROSCOPY/EXCHANGE;  Surgeon: Selma Donnice SAUNDERS, MD;  Location: [facility] ORS;  Service: Urology;  Laterality: Right;   ESOPHAGEAL MANOMETRY  N/A 03/23/2023   Procedure: ESOPHAGEAL MANOMETRY (EM);  Surgeon: Leigh Elspeth SQUIBB, MD;  Location: [facility] ENDOSCOPY;  Service: Gastroenterology;  Laterality: N/A;   INGUINAL HERNIA REPAIR     OVARIAN CYST REMOVAL     PERIPHERAL VASCULAR BALLOON  ANGIOPLASTY Left 07/29/2023   Procedure: PERIPHERAL VASCULAR BALLOON ANGIOPLASTY;  Surgeon: Gretta Lonni PARAS, MD;  Location: [facility] INVASIVE CV LAB;  Service: Cardiovascular;  Laterality: Left;   RIGHT/LEFT HEART CATH AND CORONARY ANGIOGRAPHY N/A 10/09/2021   Procedure: RIGHT/LEFT HEART CATH AND CORONARY ANGIOGRAPHY;  Surgeon: Wendel Lurena POUR, MD;  Location: [facility] INVASIVE CV LAB;  Service: Cardiovascular;  Laterality: N/A;   TOOTH EXTRACTION N/A 07/28/2022   Procedure: EXTRACTION ALL REMAING TEETH THREE, FOUR, FIVE, SIX, SEVEN, EIGHT, NINE, TEN, ELEVEN, TWELVE, TWENTY ONE, TWENTY TWO, TWENTY THREE, TWENTY FOUR, TWENTY FIVE, TWENTY SIX, TWENTY SEVEN, TWENTY EIGHT, TWENTY NINE, THIRTY, AVEOLOPLASTY;  Surgeon: Sheryle Hamilton, DMD;  Location: [facility] OR;  Service: Oral Surgery;  Laterality: N/A;    Social History:  reports that she has never smoked. She has never used smokeless tobacco. She reports that she does not drink alcohol and does not use drugs.   Allergies  Allergen Reactions   Bayer Aspirin  [Aspirin ] Other (See Comments)    Stomach pain occurs with 325mg  dose, able to tolerate 81mg .   Darvon-N [Propoxyphene] Rash and Other (See Comments)    Darvocet    Ery-Tab [Erythromycin] Nausea And Vomiting   Glucophage  [Metformin ] Diarrhea, Nausea Only and Other (See Comments)    General GI intolerance    Motrin [Ibuprofen] Other (See Comments)    Stomach pain   Neurontin [Gabapentin] Other (See Comments)    Suicidal ideations     Family History  Problem Relation Age of Onset   Diabetes Mother    Hypertension Mother    Arthritis Mother        RA   Pancreatic cancer Father    Stomach cancer Neg Hx    Colon cancer Neg Hx    Esophageal cancer Neg Hx    Rectal cancer Neg Hx       Prior to Admission medications   Medication Sig Start Date End Date Taking? Authorizing Provider  acetaminophen  (TYLENOL ) 500 MG tablet Take 1,000 mg by mouth every 6 (six) hours as needed for headache or fever  (pain).    [provider]  aspirin  81 MG chewable tablet Chew 1 tablet (81 mg total) by mouth daily. Patient taking differently: Chew 81 mg by mouth at bedtime. 12/15/21   Bensimhon, Toribio SAUNDERS, MD  atorvastatin  (LIPITOR ) 80 MG tablet Take 1 tablet (80 mg total) by mouth daily. Patient taking differently: Take 80 mg by mouth at bedtime. 03/15/24   Newlin, Enobong, MD  clopidogrel  (PLAVIX ) 75 MG tablet Take 1 tablet (75 mg total) by mouth daily. 03/13/24   Newlin, Enobong, MD  Continuous Glucose Receiver (DEXCOM G7 RECEIVER) DEVI Apply as directed. Patient not taking: Reported on 06/26/2024 03/05/24   Jonel Lonni SQUIBB, MD  furosemide  (LASIX ) 20 MG tablet Take 1 tablet (20 mg total) by mouth daily. Patient not taking: Reported on 06/26/2024 05/18/24   Raenelle Coria, MD  Glucose Blood (BLOOD GLUCOSE TEST STRIPS) STRP 1 each by Does not apply route 3 (three) times daily. Use as directed to check blood sugar. May dispense any manufacturer covered by patient's insurance and fits patient's device. Patient not taking: Reported on 06/26/2024 03/05/24   Jonel Lonni SQUIBB, MD  insulin  aspart (NOVOLOG ) 100 UNIT/ML FlexPen Inject  3-14 Units into the skin 3 (three) times daily with meals. If eating and Blood Glucose (BG) 80 or higher inject 3 units for meal coverage and add correction dose per scale. If not eating, correction dose only. BG <150= 0 unit; BG 150-200= 1 unit; BG 201-250= 3 unit; BG 251-300= 5 unit; BG 301-350= 7 unit; BG 351-400= 9 unit; BG >400= 11 unit and Call Primary Care. 03/05/24   Danford, Lonni SQUIBB, MD  insulin  glargine (LANTUS ) 100 UNIT/ML Solostar Pen Inject 20 Units into the skin daily. Patient taking differently: Inject 18-20 Units into the skin See admin instructions. Inject 18u three times daily with meals and 20u at bedtime. 03/15/24   Newlin, Enobong, MD  Insulin  Pen Needle (PEN NEEDLES) 31G X 5 MM MISC Use 3 times a day. Patient not taking: Reported on 06/26/2024 03/05/24    Jonel Lonni SQUIBB, MD  Iron , Ferrous Sulfate , 325 (65 Fe) MG TABS Take 325 mg by mouth daily. 03/16/24   Newlin, Enobong, MD  Lancet Device MISC 1 each by Does not apply route 3 (three) times daily. May dispense any manufacturer covered by patient's insurance. Patient not taking: Reported on 06/26/2024 03/05/24   Jonel Lonni SQUIBB, MD  metoprolol  succinate (TOPROL -XL) 25 MG 24 hr tablet Take 0.5 tablets (12.5 mg total) by mouth daily. Take with or immediately following a meal. 03/15/24   Newlin, Enobong, MD  potassium chloride  SA (KLOR-CON  M) 20 MEQ tablet Take 1 tablet (20 mEq total) by mouth 2 (two) times daily. 06/16/24   Newlin, Enobong, MD  sacubitril -valsartan  (ENTRESTO ) 49-51 MG Take 1 tablet by mouth 2 (two) times daily. 05/06/23   Newlin, Enobong, MD  vitamin B-12 (CYANOCOBALAMIN) 1000 MCG tablet Take 3,000 mcg by mouth daily.    [provider]    Physical Exam: BP (!) 135/91   Pulse 94   Temp 97.7 F (36.5 C)   Resp 18   Ht 5' 4 (1.626 m)   Wt 49.9 kg   LMP 11/16/2014 (Approximate)   SpO2 97%   BMI 18.88 kg/m   General: 63 y.o. year-old female well developed well nourished in no acute distress.  Alert and oriented x3. Cardiovascular: Regular rate and rhythm with no rubs or gallops.  No thyromegaly or JVD noted.  3+ pitting edema in lower extremities bilaterally. Respiratory: Very faint rales at bases. Good inspiratory effort. Abdomen: Soft nontender nondistended with normal bowel sounds x4 quadrants. Muskuloskeletal: No cyanosis or clubbing noted bilaterally Neuro: CN II-XII intact, strength, sensation, reflexes Skin: No ulcerative lesions noted or rashes Psychiatry: Judgement and insight appear normal. Mood is appropriate for condition and setting          Labs on Admission:  Basic Metabolic Panel: Recent Labs  Lab 06/26/24 0903 06/27/24 1336  NA 136 140  K 4.3 3.8  CL 99 108  CO2 24 21*  GLUCOSE 265* 206*  BUN 6* 7*  CREATININE 0.76 0.71   CALCIUM  9.0 7.5*   Liver Function Tests: Recent Labs  Lab 06/26/24 0903 06/27/24 1336  AST 10 21  ALT 8 10  ALKPHOS 119 86  BILITOT 0.4 1.0  PROT 7.0 6.3*  ALBUMIN 3.6* 2.6*   No results for input(s): LIPASE, AMYLASE in the last 168 hours. No results for input(s): AMMONIA in the last 168 hours. CBC: Recent Labs  Lab 06/27/24 1336  WBC 7.2  NEUTROABS 5.0  HGB 9.7*  HCT 32.1*  MCV 80.7  PLT 276   Cardiac Enzymes: No results for  input(s): CKTOTAL, CKMB, CKMBINDEX, TROPONINI in the last 168 hours.  BNP (last 3 results) Recent Labs    12/26/23 1624 05/11/24 2100 06/27/24 1336  BNP 119.1* 2,255.1* 1,890.2*    ProBNP (last 3 results) Recent Labs    05/13/24 0308 06/26/24 0903  PROBNP 7,997.0* 11,873*    CBG: Recent Labs  Lab 06/28/24 0143  GLUCAP 132*    Radiological Exams on Admission: DG Chest 1 View Result Date: 06/27/2024 EXAM: 1 VIEW(S) XRAY OF THE CHEST 06/27/2024 02:05:00 PM COMPARISON: 05/13/2024 CLINICAL HISTORY: Chest pain. FINDINGS: LUNGS AND PLEURA: Bilateral pleural effusions are noted with associated atelectasis, left greater than right. No pneumothorax. HEART AND MEDIASTINUM: No acute abnormality of the cardiac and mediastinal silhouettes. BONES AND SOFT TISSUES: No acute osseous abnormality. IMPRESSION: 1. Bilateral pleural effusions with associated atelectasis, left greater than right. Electronically signed by: Lynwood Seip MD 06/27/2024 02:25 PM EST RP Workstation: [id number]    EKG: I independently viewed the EKG done and my findings are as followed: Normal sinus rhythm rate of 97.  QTc 464.  Assessment/Plan Present on Admission: **None**  Principal Problem:   Acute on chronic congestive heart failure (HCC)  Acute on chronic HFpEF Presented with BNP greater than 1800, bilateral lower extremity pitting edema, bilateral pleural effusions, left greater than right, Follow transthoracic echocardiogram Start strict I's and  O's and daily weight Consider cardiology consult in the morning to assist with the management.  Bilateral pleural effusions secondary to the above Seen on chest x-ray, personally reviewed. Early ambulation Incentive spirometer Maintain O2 saturation above 92%.  Type 2 diabetes with hyperglycemia Last hemoglobin A1c 10.7 on 04/20/2024 Presented with serum glucose of greater than 200. Insulin  coverage, long-acting and short acting insulin .  Mild non-anion gap metabolic acidosis Serum bicarb 21, anion gap 11 Continue IV diuresing. Repeat BMP in the morning.  Coronary artery disease Peripheral artery disease Resume home regimen. On DAPT Aspirin /Plavix , Lipitor , Toprol -XL prior to admission.  Generalized weakness PT OT assessment Fall precautions.    Critical care time: 55 minutes.    DVT prophylaxis: Subcu Lovenox  daily.  Code Status: Full code.  Family Communication: None at bedside.  Disposition Plan: Admitted to telemetry unit.  Consults called: Consider cardiology consultation in the morning.  Admission status: Inpatient status.   Status is: Inpatient The patient requires at least 2 midnights for further evaluation and treatment of present condition.   Terry LOISE Hurst MD [facility] Pager [phone]  If 7PM-7AM, please contact night-coverage [email or web address] Password [email or web address]  06/28/2024, 1:54 AM

## 2024-06-28 NOTE — Progress Notes (Addendum)
 Same day note  Brooke [name] is a 62 y.o. female with medical history significant for type 2 diabetes, hypertension, hyperlipidemia, coronary artery disease, peripheral artery disease, chronic HFpEF chronic indwelling Foley catheter use, history of dysphagia status post esophagus dilatation on 10/22/2022 by [facility], presented hospital with intermittent chest pain for the past 2 days and fluid overload.  Associated with exertional shortness of breath and bilateral lower extremity edema.  States she does not use salt in her diet and has been compliant with her home diuretic.  In the ED vitals were stable.  EKG was unremarkable.  High-sensitivity troponin negative x 2.  Serum glucose over 200.  BNP greater than 1800 and volume overload on exam.  Chest x-ray revealed bilateral pleural effusions, left greater than right.  Received Lasix  40 mg in the ED and was admitted hospital for further evaluation and treatment  Patient seen and examined at bedside.  Patient was admitted to the hospital for chest pain and shortness of breath  At the time of my evaluation, patient complains of shortness of breath  Physical examination reveals distended neck veins, positive for lower extremity edema.  Laboratory data and imaging was reviewed  Assessment and Plan.  Acute on chronic HFpEF Patient presented with  BNP greater than 1800, bilateral lower extremity pitting edema, bilateral pleural effusions, left greater than right.  Review of previous 2D echocardiogram from 10/15/2021 showed LV ejection fraction of 20 to 25%.  Check 2D echocardiogram.  Continue strict intake and output charting Daily weights.  Telemetry monitoring.  Patient has been seen at the cardiology clinic 2 days back.  Will reach out to cardiology for continued care.  Entresto  and spironolactone  on hold at this time.  Patient is not a candidate for SGLT 2 due to recent history of pyelonephritis and chronic Foley usage.  Continue IV Lasix  20 twice  daily.  Was on 20 mg daily at home.  History of coronary artery disease status post cath 2023 showed severe multivessel disease.  Not a candidate for coronary intervention due to diffuse disease.  Patient on dual antiplatelets Lipitor  metoprolol  at home.  Hypertension.  Patient is on Entresto  metoprolol  spironolactone  as outpatient.  Blood pressure seems to be stable.   Bilateral pleural effusions secondary to congestive heart failure Continue diuretics.  Supplemental oxygen as needed incentive spirometry.   Type 2 diabetes with hyperglycemia Review of last hemoglobin A1c 10.7 on 04/20/2024.  Had hyperglycemia on presentation.  Continue sliding scale insulin  and long-acting insulin  Accu-Cheks diabetic diet.  Being followed by PCP as outpatient.  On insulin  as outpatient.  Farxiga  on hold due to recent DKA UTI pyelonephritis.   Mild non-anion gap metabolic acidosis Improved   Hyperlipidemia Peripheral artery disease Continue Aspirin /Plavix , Lipitor , Toprol -XL   Generalized weakness Continue PT OT.    No Charge  Signed,  Vernal Anselm Alstrom, MD [facility]

## 2024-06-28 NOTE — Telephone Encounter (Signed)
 Attempted to reach pt. Not able to leave a VM.

## 2024-06-28 NOTE — Progress Notes (Signed)
 Discussed the risk and benefit of a left and right cardiac catheterization and answered the patient's questions.  Informed Consent   Shared Decision Making/Informed Consent The risks [stroke (1 in 1000), death (1 in 1000), kidney failure [usually temporary] (1 in 500), bleeding (1 in 200), allergic reaction [possibly serious] (1 in 200)], benefits (diagnostic support and management of coronary artery disease) and alternatives of a left and right cardiac catheterization were discussed in detail with Brooke [name] and she is willing to proceed.     Signed,  Morse Clause, PA-C 06/28/2024, 5:02 PM

## 2024-06-28 NOTE — ED Provider Notes (Signed)
 Emergency Department Provider Note  TRIAGE NOTE: Pt from home with ems c.o chest pain, saw her cardiologist yesterday and they were concerned for fluid overload. Pt is also SOB. Pt also c.o RUQ pain, pt appears jaundice.  Pt given 324 ASA and 4mg  zofran  Iv and 1 nitroglycerin  PTA.  Pt has chronic foley catheter.   HISTORY  Chief Complaint Chest Pain, Shortness of Breath, and Leg Swelling   HPI Brooke [name] is a 63 y.o. female with  a history of chronic heart failure diagnosed in March 2023, presenting with worsening symptoms over the past two weeks. She reports experiencing slight chest pain, significant leg swelling, and difficulty breathing, which is exacerbated by exertion. The patient mentions a dry cough that worsens when lying flat, leading to significant sleep disturbances, with only 8-10 hours of sleep in the past month. She underwent bladder surgery on September 22, which involved the placement of a stent and catheter, and subsequently developed sepsis on October 1. She was previously on spironolactone , which was discontinued, leading to increased leg swelling. She is currently taking spironolactone  again, with recent blood work being done to adjust the dosage and continues to worsen. Not on furosemide . The patient denies any recent fevers, productive cough, or rashes. She does not use supplemental oxygen at home and has no history of smoking or alcohol use. The patient also reports abdominal discomfort related to her previous surgery. History was obtained from the patient.  PMH Past Medical History:  Diagnosis Date   Abdominal pain, left lower quadrant 12/17/2015   Anemia    Anxiety 07/01/2016   Carotid artery disease 10/27/2022   Chest pain, unspecified 10/05/2003   CHF (congestive heart failure) (HCC)    Chronic systolic heart failure (HCC) 02/20/2022   Depression, major, recurrent, moderate (HCC) 07/31/2005   Diabetes mellitus without complication (HCC) 09/08/2006    Endometriosis 03/28/1996   GERD (gastroesophageal reflux disease)    Heart murmur    History of colonic polyps 08/07/2015   20 mm removed 05/10/15 by Dr. Kristie     PAD (peripheral artery disease)    Unspecified constipation 06/24/2004    Home Medications Prior to Admission medications   Medication Sig Start Date End Date Taking? Authorizing Provider  acetaminophen  (TYLENOL ) 500 MG tablet Take 1,000 mg by mouth every 6 (six) hours as needed for headache or fever (pain).    [provider]  aspirin  81 MG chewable tablet Chew 1 tablet (81 mg total) by mouth daily. Patient taking differently: Chew 81 mg by mouth at bedtime. 12/15/21   Bensimhon, Toribio SAUNDERS, MD  atorvastatin  (LIPITOR ) 80 MG tablet Take 1 tablet (80 mg total) by mouth daily. Patient taking differently: Take 80 mg by mouth at bedtime. 03/15/24   Newlin, Enobong, MD  clopidogrel  (PLAVIX ) 75 MG tablet Take 1 tablet (75 mg total) by mouth daily. 03/13/24   Newlin, Enobong, MD  Continuous Glucose Receiver (DEXCOM G7 RECEIVER) DEVI Apply as directed. Patient not taking: Reported on 06/26/2024 03/05/24   Jonel Lonni SQUIBB, MD  furosemide  (LASIX ) 20 MG tablet Take 1 tablet (20 mg total) by mouth daily. Patient not taking: Reported on 06/26/2024 05/18/24   Raenelle Coria, MD  Glucose Blood (BLOOD GLUCOSE TEST STRIPS) STRP 1 each by Does not apply route 3 (three) times daily. Use as directed to check blood sugar. May dispense any manufacturer covered by patient's insurance and fits patient's device. Patient not taking: Reported on 06/26/2024 03/05/24   Jonel Lonni SQUIBB, MD  insulin   aspart (NOVOLOG ) 100 UNIT/ML FlexPen Inject 3-14 Units into the skin 3 (three) times daily with meals. If eating and Blood Glucose (BG) 80 or higher inject 3 units for meal coverage and add correction dose per scale. If not eating, correction dose only. BG <150= 0 unit; BG 150-200= 1 unit; BG 201-250= 3 unit; BG 251-300= 5 unit; BG 301-350= 7 unit; BG  351-400= 9 unit; BG >400= 11 unit and Call Primary Care. 03/05/24   Danford, Lonni SQUIBB, MD  insulin  glargine (LANTUS ) 100 UNIT/ML Solostar Pen Inject 20 Units into the skin daily. Patient taking differently: Inject 18-20 Units into the skin See admin instructions. Inject 18u three times daily with meals and 20u at bedtime. 03/15/24   Newlin, Enobong, MD  Insulin  Pen Needle (PEN NEEDLES) 31G X 5 MM MISC Use 3 times a day. Patient not taking: Reported on 06/26/2024 03/05/24   Jonel Lonni SQUIBB, MD  Iron , Ferrous Sulfate , 325 (65 Fe) MG TABS Take 325 mg by mouth daily. 03/16/24   Newlin, Enobong, MD  Lancet Device MISC 1 each by Does not apply route 3 (three) times daily. May dispense any manufacturer covered by patient's insurance. Patient not taking: Reported on 06/26/2024 03/05/24   Jonel Lonni SQUIBB, MD  metoprolol  succinate (TOPROL -XL) 25 MG 24 hr tablet Take 0.5 tablets (12.5 mg total) by mouth daily. Take with or immediately following a meal. 03/15/24   Newlin, Enobong, MD  potassium chloride  SA (KLOR-CON  M) 20 MEQ tablet Take 1 tablet (20 mEq total) by mouth 2 (two) times daily. 06/16/24   Newlin, Enobong, MD  sacubitril -valsartan  (ENTRESTO ) 49-51 MG Take 1 tablet by mouth 2 (two) times daily. 05/06/23   Newlin, Enobong, MD  vitamin B-12 (CYANOCOBALAMIN) 1000 MCG tablet Take 3,000 mcg by mouth daily.    [provider]    Social History Social History   Tobacco Use   Smoking status: Never   Smokeless tobacco: Never  Vaping Use   Vaping status: Never Used  Substance Use Topics   Alcohol use: No   Drug use: No    Review of Systems: Documented in HPI ____________________________________________  PHYSICAL EXAM: VITAL SIGNS: Triage: Blood pressure 119/82, pulse 89, temperature 97.7 F (36.5 C), resp. rate 20, height 5' 4 (1.626 m), weight 49.9 kg, last menstrual period 11/16/2014, SpO2 100%.  Vitals:   06/27/24 1325 06/27/24 1328 06/27/24 1749 06/27/24 2136  BP:  125/83  127/86 119/82  Pulse: 98  93 89  Resp: 17  (!) 24 20  Temp: 98 F (36.7 C)  (!) 97.5 F (36.4 C) 97.7 F (36.5 C)  TempSrc: Oral     SpO2: 94%  100% 100%  Weight:  49.9 kg    Height:  5' 4 (1.626 m)      Physical Exam Vitals and nursing note reviewed.  Constitutional:      Appearance: She is well-developed.  HENT:     Head: Normocephalic and atraumatic.  Cardiovascular:     Rate and Rhythm: Normal rate and regular rhythm.  Pulmonary:     Effort: Tachypnea present. No respiratory distress.     Breath sounds: No stridor. Rales present.  Abdominal:     General: There is no distension.     Palpations: Abdomen is soft.  Musculoskeletal:        General: Normal range of motion.     Cervical back: Normal range of motion.     Right lower leg: Edema present.     Left lower leg:  Edema present.  Neurological:     General: No focal deficit present.     Mental Status: She is alert.       ____________________________________________   LABS (all labs ordered are listed, but only abnormal results are displayed)  Labs Reviewed  CBC WITH DIFFERENTIAL/PLATELET - Abnormal; Notable for the following components:      Result Value   Hemoglobin 9.7 (*)    HCT 32.1 (*)    MCH 24.4 (*)    All other components within normal limits  COMPREHENSIVE METABOLIC PANEL WITH GFR - Abnormal; Notable for the following components:   CO2 21 (*)    Glucose, Bld 206 (*)    BUN 7 (*)    Calcium  7.5 (*)    Total Protein 6.3 (*)    Albumin 2.6 (*)    All other components within normal limits  BRAIN NATRIURETIC PEPTIDE - Abnormal; Notable for the following components:   B Natriuretic Peptide 1,890.2 (*)    All other components within normal limits  URINALYSIS, ROUTINE W REFLEX MICROSCOPIC  TROPONIN I (HIGH SENSITIVITY)  TROPONIN I (HIGH SENSITIVITY)   ____________________________________________  EKG   EKG Interpretation Date/Time:  Tuesday June 27 2024 13:28:40 EST Ventricular  Rate:  97 PR Interval:  152 QRS Duration:  86 QT Interval:  366 QTC Calculation: 464 R Axis:   40  Text Interpretation: Normal sinus rhythm Cannot rule out Anterior infarct , age undetermined ST & T wave abnormality, consider lateral ischemia Abnormal ECG When compared with ECG of 13-May-2024 06:55, PREVIOUS ECG IS PRESENT mostly similar to previous in october Confirmed by Lorette Mayo [phone]) on 06/28/2024 12:39:53 AM        ____________________________________________  RADIOLOGY  DG Chest 1 View Result Date: 06/27/2024 EXAM: 1 VIEW(S) XRAY OF THE CHEST 06/27/2024 02:05:00 PM COMPARISON: 05/13/2024 CLINICAL HISTORY: Chest pain. FINDINGS: LUNGS AND PLEURA: Bilateral pleural effusions are noted with associated atelectasis, left greater than right. No pneumothorax. HEART AND MEDIASTINUM: No acute abnormality of the cardiac and mediastinal silhouettes. BONES AND SOFT TISSUES: No acute osseous abnormality. IMPRESSION: 1. Bilateral pleural effusions with associated atelectasis, left greater than right. Electronically signed by: Lynwood Seip MD 06/27/2024 02:25 PM EST RP Workstation: [id number]   ____________________________________________  PROCEDURES  Procedure(s) performed:   .Critical Care  Performed by: Lorette Mayo, MD Authorized by: Lorette Mayo, MD   Critical care provider statement:    Critical care time (minutes):  30   Critical care was necessary to treat or prevent imminent or life-threatening deterioration of the following conditions:  Circulatory failure and cardiac failure   Critical care was time spent personally by me on the following activities:  Development of treatment plan with patient or surrogate, discussions with consultants, evaluation of patient's response to treatment, examination of patient, ordering and review of laboratory studies, ordering and review of radiographic studies, ordering and performing treatments and interventions, pulse oximetry, re-evaluation  of patient's condition and review of old charts  ____________________________________________  INITIAL IMPRESSION / ASSESSMENT AND PLAN   Clinical Course as of 06/28/24 0106  Wed Jun 28, 2024  0038 Resp(!): 24 [JM]  0039 Albumin(!): 2.6 [JM]  0039 Calcium (!): 7.5 [JM]  0039 Glucose(!): 206 [JM]  0039 B Natriuretic Peptide(!): 1,890.2 [JM]  0039 Troponin I (High Sensitivity): 11 [JM]  0106 Initial Evaluation:  - Patient presents with recent significant leg swelling, chest discomfort, and difficulty breathing, particularly when lying flat, following bladder surgery and recent discontinuation of diuretics.   Plan:  -  Admit for diuresis and close monitoring - Start intravenous diuretics - Evaluated with chest X-ray, CBC, CMP, BNP, and EKG showing elevated BNP, potassium WNL  [JM]    Clinical Course User Index [JM] Ambre Kobayashi, Selinda, MD     Images ordered viewed and obtained by myself. Agree with Radiology interpretation. Details in ED course.  Labs ordered reviewed by myself as detailed in ED course.  Consultations obtained/considered detailed in ED course.    CRITICAL INTERVENTIONS:  IV Lasix  IV potassium (h/o hypokalemia, K 3.8 today and giving lasix )  The patient presents with a history of bladder issues and recent surgery, now experiencing worsening symptoms including significant leg swelling, chest discomfort, and difficulty breathing, particularly when lying flat. 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 The patient is currently on spironolactone  but not on furosemide , and has been experiencing insomnia and a dry cough. Physical examination reveals tight, swollen legs extending upwards, and the patient reports that breathing worsens with exertion.  The differential diagnosis includes congestive heart failure exacerbation, pulmonary embolism, chronic obstructive pulmonary disease exacerbation, renal failure,  and venous insufficiency. Less likely considerations include pneumonia and deep vein thrombosis. The patient's history of COJ and recent discontinuation of diuretics, along with the significant edema and respiratory symptoms, strongly suggest a heart failure exacerbation.  Workup should include a chest X-ray, CBC, CMP, BNP, and EKG to evaluate cardiac function and rule out other potential causes of the symptoms. The EKG, interpreted by me, will help assess for any acute cardiac changes. A chest X-ray will assist in identifying pulmonary congestion or effusions.  Given the clinical presentation, the most likely diagnosis is congestive heart failure exacerbation. The plan is to admit the patient for diuresis and close monitoring. The patient will be started on intravenous diuretics to manage fluid overload   FINAL IMPRESSION Final diagnoses:  Peripheral edema  Nonspecific chest pain     Disposition Medical screening exam was performed and I feel the patient has had appropriate emergency department evaluation and work-up for their chief complaint and is stable for ADMISSION to the hospital at this time.  I discussed with Dr. Shona with the [facility] service and discussed labs, imaging and other work-up in the emergency room.  They agree to admission for further management and work-up of said condition.  ____________________________________________   NEW OUTPATIENT MEDICATIONS STARTED DURING THIS VISIT:  New Prescriptions   No medications on file    Note:  This note was prepared with assistance of Dragon voice recognition software. Occasional wrong-word or sound-a-like substitutions may have occurred due to the inherent limitations of voice recognition software.    Lorette Selinda, MD 06/28/24 [phone]

## 2024-06-28 NOTE — Hospital Course (Addendum)
 Same day note  Brooke [name] is a 63 y.o. female with medical history significant for type 2 diabetes, hypertension, hyperlipidemia, coronary artery disease, peripheral artery disease, chronic HFpEF chronic indwelling Foley catheter use, history of dysphagia status post esophagus dilatation on 10/22/2022 by [facility], presented hospital with intermittent chest pain for the past 2 days and fluid overload.  Associated with exertional shortness of breath and bilateral lower extremity edema.  States she does not use salt in her diet and has been compliant with her home diuretic.  In the ED vitals were stable.  EKG was unremarkable.  High-sensitivity troponin negative x 2.  Serum glucose over 200.  BNP greater than 1800 and volume overload on exam.  Chest x-ray revealed bilateral pleural effusions, left greater than right.  Received Lasix  40 mg in the ED and was admitted hospital for further evaluation and treatment  Patient seen and examined at bedside.  Patient was admitted to the hospital for chest pain and shortness of breath  At the time of my evaluation, patient complains of shortness of breath  Physical examination reveals distended neck veins, positive for lower extremity edema.  Laboratory data and imaging was reviewed  Assessment and Plan.  Acute on chronic HFpEF Patient presented with  BNP greater than 1800, bilateral lower extremity pitting edema, bilateral pleural effusions, left greater than right.  Review of previous 2D echocardiogram from 10/15/2021 showed LV ejection fraction of 20 to 25%.  Check 2D echocardiogram.  Continue strict intake and output charting Daily weights.  Telemetry monitoring.  Patient has been seen at the cardiology clinic 2 days back.  Patient is not a candidate for SGLT 2 due to recent history of pyelonephritis and chronic Foley usage.  History of coronary artery disease status post cath 2023 showed severe multivessel disease.  Not a candidate for coronary  intervention due to diffuse disease.  Patient on dual antiplatelets Lipitor  metoprolol  at home.  Hypertension.  Patient is on Entresto  metoprolol  spironolactone  as outpatient.  Blood pressure seems to be stable.   Bilateral pleural effusions secondary to congestive heart failure Continue diuretics.  Supplemental oxygen as needed incentive spirometry.   Type 2 diabetes with hyperglycemia Review of last hemoglobin A1c 10.7 on 04/20/2024.  Had hyperglycemia on presentation.  Continue sliding scale insulin  and long-acting insulin  Accu-Cheks diabetic diet.  Being followed by PCP as outpatient.  On insulin  as outpatient.  Farxiga  on hold due to recent DKA UTI pyelonephritis.   Mild non-anion gap metabolic acidosis Improved   Hyperlipidemia Peripheral artery disease Continue Aspirin /Plavix , Lipitor , Toprol -XL   Generalized weakness Continue PT OT.    No Charge  Signed,  Vernal Anselm Alstrom, MD [facility]

## 2024-06-29 ENCOUNTER — Encounter (HOSPITAL_COMMUNITY): Payer: Self-pay | Admitting: Internal Medicine

## 2024-06-29 ENCOUNTER — Encounter (HOSPITAL_COMMUNITY)
Admission: EM | Disposition: A | Discharge status: Home / Self Care | Payer: Self-pay | Source: Home / Self Care | Attending: Internal Medicine

## 2024-06-29 DIAGNOSIS — I2510 Atherosclerotic heart disease of native coronary artery without angina pectoris: Secondary | ICD-10-CM

## 2024-06-29 DIAGNOSIS — I5023 Acute on chronic systolic (congestive) heart failure: Secondary | ICD-10-CM

## 2024-06-29 HISTORY — PX: RIGHT/LEFT HEART CATH AND CORONARY ANGIOGRAPHY: CATH118266

## 2024-06-29 LAB — BASIC METABOLIC PANEL WITH GFR
Anion gap: 13 (ref 5–15)
BUN: 7 mg/dL — ABNORMAL LOW (ref 8–23)
CO2: 27 mmol/L (ref 22–32)
Calcium: 8.3 mg/dL — ABNORMAL LOW (ref 8.9–10.3)
Chloride: 101 mmol/L (ref 98–111)
Creatinine, Ser: 0.96 mg/dL (ref 0.44–1.00)
GFR, Estimated: >60 mL/min (ref >60)
Glucose, Bld: 75 mg/dL (ref 70–99)
Potassium: 3.9 mmol/L (ref 3.5–5.1)
Sodium: 141 mmol/L (ref 135–145)

## 2024-06-29 LAB — CBC
HCT: 32.6 % — ABNORMAL LOW (ref 36.0–46.0)
Hemoglobin: 10.2 g/dL — ABNORMAL LOW (ref 12.0–15.0)
MCH: 24.7 pg — ABNORMAL LOW (ref 26.0–34.0)
MCHC: 31.3 g/dL (ref 30.0–36.0)
MCV: 78.9 fL — ABNORMAL LOW (ref 80.0–100.0)
Platelets: 339 K/uL (ref 150–400)
RBC: 4.13 MIL/uL (ref 3.87–5.11)
RDW: 15.6 % — ABNORMAL HIGH (ref 11.5–15.5)
WBC: 6.6 K/uL (ref 4.0–10.5)
nRBC: 0 % (ref 0.0–0.2)

## 2024-06-29 LAB — POCT I-STAT EG7
Acid-Base Excess: 6 mmol/L — ABNORMAL HIGH (ref 0.0–2.0)
Bicarbonate: 30.7 mmol/L — ABNORMAL HIGH (ref 20.0–28.0)
Calcium, Ion: 1.13 mmol/L — ABNORMAL LOW (ref 1.15–1.40)
HCT: 33 % — ABNORMAL LOW (ref 36.0–46.0)
Hemoglobin: 11.2 g/dL — ABNORMAL LOW (ref 12.0–15.0)
O2 Saturation: 68 %
Potassium: 2.9 mmol/L — ABNORMAL LOW (ref 3.5–5.1)
Sodium: 145 mmol/L (ref 135–145)
TCO2: 32 mmol/L (ref 22–32)
pCO2, Ven: 46 mmHg (ref 44–60)
pH, Ven: 7.432 — ABNORMAL HIGH (ref 7.25–7.43)
pO2, Ven: 35 mmHg (ref 32–45)

## 2024-06-29 LAB — LIPID PANEL
Cholesterol: 122 mg/dL (ref 0–200)
HDL: 33 mg/dL — ABNORMAL LOW (ref >40)
LDL Cholesterol: 75 mg/dL (ref 0–99)
Total CHOL/HDL Ratio: 3.7 ratio
Triglycerides: 72 mg/dL (ref <150)
VLDL: 14 mg/dL (ref 0–40)

## 2024-06-29 LAB — POCT I-STAT 7, (LYTES, BLD GAS, ICA,H+H)
Acid-Base Excess: 5 mmol/L — ABNORMAL HIGH (ref 0.0–2.0)
Bicarbonate: 29.5 mmol/L — ABNORMAL HIGH (ref 20.0–28.0)
Calcium, Ion: 1.13 mmol/L — ABNORMAL LOW (ref 1.15–1.40)
HCT: 33 % — ABNORMAL LOW (ref 36.0–46.0)
Hemoglobin: 11.2 g/dL — ABNORMAL LOW (ref 12.0–15.0)
O2 Saturation: 97 %
Potassium: 2.9 mmol/L — ABNORMAL LOW (ref 3.5–5.1)
Sodium: 143 mmol/L (ref 135–145)
TCO2: 31 mmol/L (ref 22–32)
pCO2 arterial: 40.7 mmHg (ref 32–48)
pH, Arterial: 7.468 — ABNORMAL HIGH (ref 7.35–7.45)
pO2, Arterial: 82 mmHg — ABNORMAL LOW (ref 83–108)

## 2024-06-29 LAB — GLUCOSE, CAPILLARY
Glucose-Capillary: 60 mg/dL — ABNORMAL LOW (ref 70–99)
Glucose-Capillary: 63 mg/dL — ABNORMAL LOW (ref 70–99)
Glucose-Capillary: 81 mg/dL (ref 70–99)
Glucose-Capillary: 75 mg/dL (ref 70–99)
Glucose-Capillary: 64 mg/dL — ABNORMAL LOW (ref 70–99)
Glucose-Capillary: 70 mg/dL (ref 70–99)
Glucose-Capillary: 105 mg/dL — ABNORMAL HIGH (ref 70–99)

## 2024-06-29 LAB — PHOSPHORUS: Phosphorus: 3.9 mg/dL (ref 2.5–4.6)

## 2024-06-29 LAB — MAGNESIUM: Magnesium: 1.7 mg/dL (ref 1.7–2.4)

## 2024-06-29 SURGERY — RIGHT/LEFT HEART CATH AND CORONARY ANGIOGRAPHY
Anesthesia: LOCAL

## 2024-06-29 MED ORDER — HEPARIN SODIUM (PORCINE) 1000 UNIT/ML IJ SOLN
INTRAMUSCULAR | Status: DC | PRN
  Administered 2024-06-29: 2500 [IU] via INTRAVENOUS

## 2024-06-29 MED ORDER — SODIUM CHLORIDE 0.9% FLUSH: 3.0000 mL | INTRAVENOUS | Status: DC | PRN

## 2024-06-29 MED ORDER — FREE WATER: 250.0000 mL | Freq: Once | Status: DC

## 2024-06-29 MED ORDER — VERAPAMIL HCL 2.5 MG/ML IV SOLN
INTRAVENOUS | Status: AC
  Filled 2024-06-29: qty 2

## 2024-06-29 MED ORDER — SPIRONOLACTONE 12.5 MG HALF TABLET
12.5000 mg | ORAL_TABLET | Freq: Every day | ORAL | Status: DC
  Administered 2024-06-29: 12.5 mg via ORAL
  Filled 2024-06-29: qty 1

## 2024-06-29 MED ORDER — LIDOCAINE HCL (PF) 1 % IJ SOLN
INTRAMUSCULAR | Status: DC | PRN
  Administered 2024-06-29: 5 mL via INTRADERMAL

## 2024-06-29 MED ORDER — HYDRALAZINE HCL 20 MG/ML IJ SOLN: 10.0000 mg | INTRAMUSCULAR | Status: AC | PRN

## 2024-06-29 MED ORDER — VERAPAMIL HCL 2.5 MG/ML IV SOLN
INTRAVENOUS | Status: DC | PRN
  Administered 2024-06-29: 10 mL via INTRA_ARTERIAL

## 2024-06-29 MED ORDER — ASPIRIN 81 MG PO CHEW
81.0000 mg | CHEWABLE_TABLET | ORAL | Status: AC
  Administered 2024-06-29: 81 mg via ORAL
  Filled 2024-06-29: qty 1

## 2024-06-29 MED ORDER — HEPARIN (PORCINE) IN NACL 1000-0.9 UT/500ML-% IV SOLN
INTRAVENOUS | Status: DC | PRN
  Administered 2024-06-29: 1000 mL

## 2024-06-29 MED ORDER — DEXTROSE 50 % IV SOLN
INTRAVENOUS | Status: AC
  Administered 2024-06-29: 50 mL
  Filled 2024-06-29: qty 50

## 2024-06-29 MED ORDER — SACUBITRIL-VALSARTAN 24-26 MG PO TABS
1.0000 | ORAL_TABLET | Freq: Two times a day (BID) | ORAL | Status: DC
  Administered 2024-06-29 – 2024-07-01 (×4): 1 via ORAL
  Filled 2024-06-29 (×5): qty 1

## 2024-06-29 MED ORDER — SODIUM CHLORIDE 0.9 % IV SOLN: 250.0000 mL | INTRAVENOUS | Status: DC | PRN

## 2024-06-29 MED ORDER — HEPARIN SODIUM (PORCINE) 1000 UNIT/ML IJ SOLN
INTRAMUSCULAR | Status: AC
  Filled 2024-06-29: qty 10

## 2024-06-29 MED ORDER — SODIUM CHLORIDE 0.9% FLUSH
3.0000 mL | Freq: Two times a day (BID) | INTRAVENOUS | Status: DC
  Administered 2024-06-29 – 2024-06-30 (×2): 3 mL via INTRAVENOUS

## 2024-06-29 MED ORDER — ENOXAPARIN SODIUM 40 MG/0.4ML IJ SOSY
40.0000 mg | PREFILLED_SYRINGE | INTRAMUSCULAR | Status: DC
  Administered 2024-06-30 – 2024-07-01 (×2): 40 mg via SUBCUTANEOUS
  Filled 2024-06-29 (×2): qty 0.4

## 2024-06-29 MED ORDER — LIDOCAINE HCL (PF) 1 % IJ SOLN
INTRAMUSCULAR | Status: AC
  Filled 2024-06-29: qty 30

## 2024-06-29 MED ORDER — FREE WATER
250.0000 mL | Freq: Once | Status: AC
  Administered 2024-06-29: 250 mL via ORAL

## 2024-06-29 MED ORDER — ASPIRIN 81 MG PO CHEW: 81.0000 mg | CHEWABLE_TABLET | ORAL | Status: DC

## 2024-06-29 MED ORDER — IOHEXOL 350 MG/ML SOLN
INTRAVENOUS | Status: DC | PRN
  Administered 2024-06-29: 42 mL

## 2024-06-29 SURGICAL SUPPLY — 10 items
CATH BALLN WEDGE 5F 110CM (CATHETERS) IMPLANT
CATH INFINITI 5 FR JL3.5 (CATHETERS) IMPLANT
CATH INFINITI JR4 5F (CATHETERS) IMPLANT
DEVICE RAD TR BAND REGULAR (VASCULAR PRODUCTS) IMPLANT
ELECT DEFIB PAD ADLT CADENCE (PAD) IMPLANT
GLIDESHEATH SLEND SS 6F .021 (SHEATH) IMPLANT
GUIDEWIRE INQWIRE 1.5J.035X260 (WIRE) IMPLANT
PACK CARDIAC CATHETERIZATION (CUSTOM PROCEDURE TRAY) ×2 IMPLANT
SHEATH GLIDE SLENDER 4/5FR (SHEATH) IMPLANT
WIRE EMERALD 3MM-J .025X260CM (WIRE) IMPLANT

## 2024-06-29 NOTE — Progress Notes (Signed)
 Heart Failure Nurse Navigator Progress Note  PCP: [name], MD PCP-Cardiologist: Jeffrie Admission Diagnosis: Peripheral edema, Nonspecific chest pain.  Admitted from: Home  Presentation:   Brooke [name] presented with chest pain, shortness of breath and RUQ pain, she had just seen her cardiologist the day before and there were concerns for fluid overload. patient reports a dry cough that worsens when lying flat, leading to significant sleep disturbances, with only 8-10 hours of sleep in the past month. Patient has a chronic foley catheter. BP 125/83, HR 98, BNP 1,890, CXR with bilateral pleural effusions with associated atelectasis. Patient states she hasn't been taking her lasix , because it makes her feel bad. 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 Cherrie. Scheduled for a right / Left heart cath later on 06/29/24.   Patient was educated on the sign and symptoms of heart failure, daily weights, when to call her doctor or go to the ED. Diet/ fluid restrictions, taking all her medication as prescribed and attending all medical appointments. Patient verbalized her understanding of the education. A HF TOC appointment was scheduled for 07/04/2024 @ 2:45 pm.     ECHO/ LVEF: 20-25%   Clinical Course:  Past Medical History:  Diagnosis Date   Abdominal pain, left lower quadrant 12/17/2015   Anemia    Anxiety 07/01/2016   Carotid artery disease 10/27/2022   Chest pain, unspecified 10/05/2003   CHF (congestive heart failure) (HCC)    Chronic systolic heart failure (HCC) 02/20/2022   Depression, major, recurrent, moderate (HCC) 07/31/2005   Diabetes mellitus without complication (HCC) 09/08/2006   Endometriosis 03/28/1996   GERD (gastroesophageal reflux disease)    Heart murmur    History of colonic polyps 08/07/2015   20 mm removed 05/10/15 by Dr. Kristie     PAD (peripheral artery disease)    Unspecified constipation 06/24/2004     Social History    Socioeconomic History   Marital status: Married    Spouse name: Not on file   Number of children: 1   Years of education: Not on file   Highest education level: Not on file  Occupational History   Not on file  Tobacco Use   Smoking status: Never   Smokeless tobacco: Never  Vaping Use   Vaping status: Never Used  Substance and Sexual Activity   Alcohol use: No   Drug use: No   Sexual activity: Not Currently  Other Topics Concern   Not on file  Social History Narrative   Not on file   Social Drivers of Health   Financial Resource Strain: Medium Risk (10/31/2021)   Overall Financial Resource Strain (CARDIA)    Difficulty of Paying Living Expenses: Somewhat hard  Food Insecurity: Food Insecurity Present (06/28/2024)   Hunger Vital Sign    Worried About Running Out of Food in the Last Year: Sometimes true    Ran Out of Food in the Last Year: Sometimes true  Transportation Needs: No Transportation Needs (06/28/2024)   PRAPARE - Administrator, Civil Service (Medical): No    Lack of Transportation (Non-Medical): No  Physical Activity: Not on file  Stress: Not on file  Social Connections: Not on file   Education Assessment and Provision:  Detailed education and instructions provided on heart failure disease management including the following:  Signs and symptoms of Heart Failure When to call the physician Importance of daily weights Low sodium diet Fluid restriction Medication management Anticipated future follow-up appointments  Patient education given on each of the above topics.  Patient acknowledges understanding via teach back method and acceptance of all instructions.  Education Materials:  Living Better With Heart Failure Booklet, HF zone tool, & Daily Weight Tracker Tool.  Patient has scale at home: Yes Patient has pill box at home: Yes    High Risk Criteria for Readmission and/or Poor Patient Outcomes: Heart failure hospital admissions (last  6 months): 1  No Show rate: 5 %  Difficult social situation: No, lives with her husband Demonstrates medication adherence: No Primary Language: English  Literacy level: Reading, writing, and comprehension.   Barriers of Care:   Medication non compliance ( Lasix )  Diet/ fluid restrictions  Considerations/Referrals:   Referral made to Heart Failure Pharmacist Stewardship: Yes Referral made to Heart Failure CSW/NCM TOC: NA Referral made to Heart & Vascular TOC clinic: Yes, 07/04/2024 @ 2:45 pm. ( Graduate of D.B. 2023)   Items for Follow-up on DC/TOC: Medication  non compliance ( lasix ) Continued HF education Diet/ fluid restrictions   Stephane Haddock, BSN, RN Heart Failure Teacher, Adult Education Only

## 2024-06-29 NOTE — TOC Initial Note (Signed)
 Transition of Care [facility]) - Initial/Assessment Note    Patient Details  Name: Brooke [name] MRN: [id number] Date of Birth: Mar 24, 1961  Transition of Care [facility]) CM/SW Contact:    [name], RN Phone Number: 06/29/2024, 4:22 PM  Clinical Narrative:                 From home with spouse, has PCP and insurance on file, states has no HH services in place at this time or DME at home.  States family member will transport them home at [employer] and family is support system, states gets medications from [facility].  Pta self ambulatory.   Per PT rec HHPT, patient states she prefers OP PT. She would like to go to the one on [address] up the street from the hospital.    Expected Discharge Plan: OP Rehab Barriers to Discharge: Continued Medical Work up   Patient Goals and CMS Choice Patient states their goals for this hospitalization and ongoing recovery are:: return home          Expected Discharge Plan and Services In-house Referral: NA Discharge Planning Services: CM Consult Post Acute Care Choice: NA Living arrangements for the past 2 months: Single Family Home                 DME Arranged: N/A DME Agency: NA       HH Arranged: NA          Prior Living Arrangements/Services Living arrangements for the past 2 months: Single Family Home Lives with:: Spouse Patient language and need for interpreter reviewed:: Yes Do you feel safe going back to the place where you live?: Yes      Need for Family Participation in Patient Care: Yes (Comment) Care giver support system in place?: Yes (comment)   Criminal Activity/Legal Involvement Pertinent to Current Situation/Hospitalization: No - Comment as needed  Activities of Daily Living   ADL Screening (condition at time of admission) Independently performs ADLs?: Yes (appropriate for developmental age) Is the patient deaf or have difficulty hearing?: No Does the patient have difficulty seeing, even when wearing  glasses/contacts?: No Does the patient have difficulty concentrating, remembering, or making decisions?: No  Permission Sought/Granted Permission sought to share information with : Case Manager Permission granted to share information with : Yes, Verbal Permission Granted     Permission granted to share info w AGENCY: OP PT        Emotional Assessment Appearance:: Appears stated age Attitude/Demeanor/Rapport: Engaged Affect (typically observed): Appropriate Orientation: : Oriented to Self, Oriented to Place, Oriented to  Time, Oriented to Situation Alcohol / Substance Use: Not Applicable Psych Involvement: No (comment)  Admission diagnosis:  Peripheral edema [R60.0] Acute on chronic congestive heart failure (HCC) [I50.9] Nonspecific chest pain [R07.9] Patient Active Problem List   Diagnosis Date Noted   Acute on chronic congestive heart failure (HCC) 06/28/2024   Acute systolic heart failure (HCC) 06/28/2024   DKA (diabetic ketoacidosis) (HCC) 05/12/2024   Pyelonephritis 05/12/2024   Chronic diastolic CHF (congestive heart failure) (HCC) 05/12/2024   Anemia of chronic disease 05/12/2024   Hydroureter on right 05/12/2024   AKI (acute kidney injury) 03/04/2024   Hypokalemia 03/04/2024   Right hydronephrosis 03/02/2024   Hyponatremia 03/02/2024   Diabetes (HCC) 01/14/2024   On anticoagulant therapy 01/14/2024   Family history of malignant neoplasm of colon 01/14/2024   Chronic neck pain 01/14/2024   PAD (peripheral artery disease) 07/06/2023   Esophageal dysphagia 04/09/2023   Carotid artery  disease 10/27/2022   Carpal tunnel syndrome of left wrist 08/25/2022   Carpal tunnel syndrome of right wrist 08/25/2022   Pain in right hand 08/25/2022   Weakness of right hand 08/25/2022   Protein calorie malnutrition 04/07/2022   Chronic systolic heart failure (HCC) 02/20/2022   Pressure injury of skin 10/10/2021   Chronic pelvic pain in female 05/06/2020   Anxiety 07/01/2016    Wrist pain 06/24/2016   Back pain 06/03/2016   Abdominal pain, left lower quadrant 12/17/2015   History of colonic polyps 08/07/2015   Diverticulitis 05/28/2015   Seborrheic keratoses 01/30/2015   Hyperlipidemia 01/30/2015   Generalized anxiety disorder 07/04/2014   Diabetes mellitus without complication (HCC) 09/08/2006   Depression, major, recurrent, moderate (HCC) 07/31/2005   Hypertension 11/23/2003   Endometriosis 03/28/1996   PCP:  [name], MD Pharmacy:   [facility] [address] [place] [address] Phone: [phone] Fax: [phone]  [id number] - [place], [place] - [address] AT [facility] [address] [place] [address] Phone: [phone] Fax: [phone]  [place] [facility] [address] [place] [address] Phone: [phone] Fax: [phone]  [facility] [address] [place] [address] Phone: [phone] Fax: [phone]     Social Drivers of Health (SDOH) Social History: SDOH Screenings   Food Insecurity: Food Insecurity Present (06/28/2024)  Housing: Low Risk  (06/28/2024)  Transportation Needs: No Transportation Needs (06/28/2024)  Utilities: Not At Risk (06/28/2024)  Alcohol Screen: Low Risk  (06/29/2024)  Depression (PHQ2-9): Medium Risk (06/15/2024)  Financial Resource Strain: Low Risk  (06/29/2024)  Tobacco Use: Low Risk  (06/27/2024)   SDOH Interventions: Alcohol Usage Interventions: Intervention Not Indicated (Score <7) Financial Strain Interventions: Intervention Not Indicated   Readmission Risk Interventions    05/16/2024   11:47 AM 10/17/2021    4:02 PM  Readmission Risk Prevention Plan  Post Dischage Appt  Complete  Medication Screening  Complete  Transportation Screening Complete Complete  PCP or Specialist Appt within 5-7 Days  Complete   Home Care Screening Complete   Medication Review (RN CM) Complete

## 2024-06-29 NOTE — Inpatient Diabetes Management (Signed)
 Inpatient Diabetes Program Recommendations  AACE/ADA: New Consensus Statement on Inpatient Glycemic Control (2015)  Target Ranges:  Prepandial:   less than 140 mg/dL      Peak postprandial:   less than 180 mg/dL (1-2 hours)      Critically ill patients:  140 - 180 mg/dL   Lab Results  Component Value Date   GLUCAP 75 06/29/2024   HGBA1C 10.7 (H) 04/20/2024    Review of Glycemic Control  Latest Reference Range & Units 06/29/24 05:35 06/29/24 05:38 06/29/24 09:13 06/29/24 11:20  Glucose-Capillary 70 - 99 mg/dL 60 (L) 63 (L) 81 75   Diabetes history: DM 2 Outpatient Diabetes medications:  Lantus  20-21 units daily Novolog  3-14 units tid with meals  Dexcom  Current orders for Inpatient glycemic control:  Semglee  5 units bid Novolog  0-9 units tid with meals and HS Novolog  3 units tid with meals  Inpatient Diabetes Program Recommendations:   Recommend D/C of Novolog  meal coverage 3 units (has not received since admit). Also may consider reducing Novolog  correction to very sensitive (0-6 units tid with meals).   Thanks,  Randall Bullocks, RN, BC-ADM Inpatient Diabetes Coordinator Pager [phone]  (8a-5p)

## 2024-06-29 NOTE — Progress Notes (Signed)
  Progress Note  Patient Name: Brooke [name] Date of Encounter: 06/29/2024 [facility] Cardiologist: Oneil Parchment, MD   Interval Summary   No events overnight. She reports improvement dyspnea but does still report some LE edema.   Vital Signs Vitals:   06/28/24 2337 06/29/24 0428 06/29/24 0500 06/29/24 0724  BP: (!) 111/55 (!) 109/58  112/72  Pulse: 83 80  80  Resp: 18 19  18   Temp: 98.4 F (36.9 C) 98.5 F (36.9 C)  97.9 F (36.6 C)  TempSrc: Oral Oral  Oral  SpO2: 100% 100%  100%  Weight:   49.9 kg   Height:        Intake/Output Summary (Last 24 hours) at 06/29/2024 0837 Last data filed at 06/29/2024 0530 Gross per 24 hour  Intake 250 ml  Output 2500 ml  Net -2250 ml      06/29/2024    5:00 AM 06/27/2024    1:28 PM 06/26/2024    8:10 AM  Last 3 Weights  Weight (lbs) 110 lb 1.6 oz 110 lb 122 lb 9.6 oz  Weight (kg) 49.941 kg 49.896 kg 55.611 kg      Telemetry/ECG  NSR - Personally Reviewed  Physical Exam  GEN: No acute distress.   Neck: JVD Cardiac: RRR, no murmurs, rubs, or gallops.  Respiratory: Clear to auscultation bilaterally. 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 TTE 20-25%.    #Volume overload #CP #Known severe multivessel CAD #PAD s/p multiple interventions - Presented with CHF and CP and found to have newly reduced EF 20-25, likely ischemic given CAD history.  - Cont IV lasix  40 mg BID - Plan for LHC/RHC today - Cont aspirin , lipitor  80, XL 12.5, and Plavix  75 - Re-start Entresto  24-26 and spiro 12.5 today; No SGLT2 given history of recent pyelo - We will continue to follow  For  questions or updates, please contact [facility] Please consult [email or web address] for contact info under   Signed, Joelle VEAR Ren Donley, MD

## 2024-06-29 NOTE — Plan of Care (Signed)
 Patient got an ampule of glucose in AM around 0535, with BG at 60-63. BG was 75 around noon, and just now around 1622, patient's BG was 64, and was given a cola. Patient is currently receiving 5U SEMGLEE . Hopefully this med will be evaluated again tomorrow when patient hasn't been NPO all day.   Patient is easy-going and helpful.   Went to heart cath around 1410, and returned to unit around 1510. Performing reverse barbeau patient at 6 CC from 12.

## 2024-06-29 NOTE — Progress Notes (Signed)
 PROGRESS NOTE  Brooke [name] FMW:[id number] DOB: Jan 13, 1961 DOA: 06/27/2024 PCP: [name], MD   LOS: 1 day   Brief narrative:  Same day note  Brooke [name] is a 63 y.o. female with medical history significant for type 2 diabetes, hypertension, hyperlipidemia, coronary artery disease, peripheral artery disease, chronic HFpEF chronic indwelling Foley catheter use, history of dysphagia status post esophagus dilatation on 10/22/2022 by [facility], presented hospital with intermittent chest pain for the past 2 days and fluid overload.  Associated with exertional shortness of breath and bilateral lower extremity edema.  States she does not use salt in her diet and has been compliant with her home diuretic.  In the ED vitals were stable.  EKG was unremarkable.  High-sensitivity troponin negative x 2.  Serum glucose over 200.  BNP greater than 1800 and volume overload on exam.  Chest x-ray revealed bilateral pleural effusions, left greater than right.  Received Lasix  40 mg in the ED and was admitted hospital for further evaluation and treatment.  At this time patient has been seen by cardiology and plan for cardiac catheterization.    Assessment/Plan: Principal Problem:   Acute on chronic congestive heart failure (HCC) Active Problems:   Acute systolic heart failure (HCC)  Acute on chronic HFpEF Patient presented with  BNP greater than 1800, bilateral lower extremity pitting edema, bilateral pleural effusions, left greater than right.  Review of previous 2D echocardiogram from 10/15/2021 showed LV ejection fraction of 20 to 25%.  Check 2D echocardiogram.  Continue strict intake and output charting Daily weights.  Continue telemetry monitoring.  Patient had been seen at the cardiology clinic 2 days prior to presentation patient is not a candidate for SGLT 2 due to recent history of pyelonephritis and chronic Foley usage.  At this time cardiology has been reconsulted due to decompensated heart  failure  History of coronary artery disease status post cath 2023, mild chest discomfort.  Showed severe multivessel disease.  Not a candidate for coronary intervention due to diffuse disease.  Patient on dual antiplatelets Lipitor  metoprolol  at home.  At this time cardiology planning for  Hypertension.  Patient is on Entresto  metoprolol  spironolactone  as outpatient.  Blood pressure seems to be stable.   Bilateral pleural effusions secondary to congestive heart failure Continue diuretics.  Supplemental oxygen as needed incentive spirometry.   Type 2 diabetes with hyperglycemia Review of last hemoglobin A1c 10.7 on 04/20/2024.  Had hyperglycemia on presentation.  Continue sliding scale insulin  and long-acting insulin  Accu-Cheks diabetic diet.  Being followed by PCP as outpatient.  On insulin  as outpatient.  Farxiga  on hold due to recent DKA UTI/pyelonephritis.   Mild non-anion gap metabolic acidosis Improved   Hyperlipidemia Peripheral artery disease Continue Aspirin /Plavix , Lipitor , Toprol -XL   Generalized weakness Continue PT OT when stable.   DVT prophylaxis: enoxaparin  (LOVENOX ) injection 40 mg Start: 06/28/24 1000   Disposition: Home likely in 1 to 2 days  Status is: Inpatient Remains inpatient appropriate because: Pending clinical improvement, need for diuresis and cardiac catheterization    Code Status:     Code Status: Full Code  Family Communication: None at bedside  Consultants: Cardiology  Procedures: None yet  Anti-infectives:  None  Anti-infectives (From admission, onward)    None       Subjective: Today, patient was seen and examined at bedside.  Patient denies any nausea, vomiting, fever, chills or rigor.  Feels a little better with breathing today.  Had some chest discomfort early morning.  Objective: Vitals:  06/29/24 0428 06/29/24 0724  BP: (!) 109/58 112/72  Pulse: 80 80  Resp: 19 18  Temp: 98.5 F (36.9 C) 97.9 F (36.6 C)  SpO2:  100% 100%    Intake/Output Summary (Last 24 hours) at 06/29/2024 1012 Last data filed at 06/29/2024 0530 Gross per 24 hour  Intake 250 ml  Output 2500 ml  Net -2250 ml   Filed Weights   06/27/24 1328 06/29/24 0500  Weight: 49.9 kg 49.9 kg   Body mass index is 18.9 kg/m.   Physical Exam:  GENERAL: Patient is alert awake and oriented. Not in obvious distress.  Thinly built, HENT: No scleral pallor or icterus. Pupils equally reactive to light. Oral mucosa is moist NECK: is supple, no gross swelling noted. CHEST: Decreased breath sounds bilaterally CVS: S1 and S2 heard, no murmur. Regular rate and rhythm.  ABDOMEN: Soft, non-tender, bowel sounds are present. EXTREMITIES: Bilateral lower extremity edema. CNS: Cranial nerves are intact. No focal motor deficits. SKIN: warm and dry without rashes.  Data Review: I have personally reviewed the following laboratory data and studies,  CBC: Recent Labs  Lab 06/27/24 1336 06/28/24 0515 06/29/24 0247  WBC 7.2 8.1 6.6  NEUTROABS 5.0  --   --   HGB 9.7* 10.5* 10.2*  HCT 32.1* 33.8* 32.6*  MCV 80.7 79.7* 78.9*  PLT 276 338 339   Basic Metabolic Panel: Recent Labs  Lab 06/26/24 0903 06/27/24 1336 06/28/24 0515 06/29/24 0247  NA 136 140 140 141  K 4.3 3.8 4.3 3.9  CL 99 108 103 101  CO2 24 21* 24 27  GLUCOSE 265* 206* 86 75  BUN 6* 7* 7* 7*  CREATININE 0.76 0.71 0.93 0.96  CALCIUM  9.0 7.5* 9.0 8.3*  MG  --   --  1.7 1.7  PHOS  --   --  3.2 3.9   Liver Function Tests: Recent Labs  Lab 06/26/24 0903 06/27/24 1336  AST 10 21  ALT 8 10  ALKPHOS 119 86  BILITOT 0.4 1.0  PROT 7.0 6.3*  ALBUMIN 3.6* 2.6*   No results for input(s): LIPASE, AMYLASE in the last 168 hours. No results for input(s): AMMONIA in the last 168 hours. Cardiac Enzymes: No results for input(s): CKTOTAL, CKMB, CKMBINDEX, TROPONINI in the last 168 hours. BNP (last 3 results) Recent Labs    12/26/23 1624 05/11/24 2100  06/27/24 1336  BNP 119.1* 2,255.1* 1,890.2*    ProBNP (last 3 results) Recent Labs    05/13/24 0308 06/26/24 0903  PROBNP 7,997.0* 11,873*    CBG: Recent Labs  Lab 06/28/24 1651 06/28/24 2048 06/29/24 0535 06/29/24 0538 06/29/24 0913  GLUCAP 104* 140* 60* 63* 81   No results found for this or any previous visit (from the past 240 hours).   Studies: ECHOCARDIOGRAM COMPLETE Result Date: 06/28/2024    ECHOCARDIOGRAM REPORT   Patient Name:   Brooke [name] Date of Exam: 06/28/2024 Medical Rec #:  [id number]      Height:       64.0 in Accession #:    [id number]     Weight:       110.0 lb Date of Birth:  Apr 17, 1961      BSA:          1.517 m Patient Age:    63 years       BP:           118/70 mmHg Patient Gender: F  HR:           78 bpm. Exam Location:  Inpatient Procedure: 2D Echo, Cardiac Doppler, Color Doppler and Intracardiac            Opacification Agent (Both Spectral and Color Flow Doppler were            utilized during procedure). Indications:    CHF I50.31  History:        Patient has prior history of Echocardiogram examinations, most                 recent 10/09/2021. CHF.  Sonographer:    Tinnie Gosling RDCS Referring Phys: [id number] CAROLE N HALL IMPRESSIONS  1. Left ventricular ejection fraction, by estimation, is 20 to 25%. The left ventricle has severely decreased function. The left ventricle demonstrates global hypokinesis. The left ventricular internal cavity size was mildly dilated. Left ventricular diastolic parameters are consistent with Grade II diastolic dysfunction (pseudonormalization).  2. Right ventricular systolic function is normal. The right ventricular size is normal. Tricuspid regurgitation signal is inadequate for assessing PA pressure.  3. Left atrial size was mildly dilated.  4. A small pericardial effusion is present. The pericardial effusion is localized near the right atrium. There is no evidence of cardiac tamponade.  5. The mitral valve is normal  in structure. Trivial mitral valve regurgitation. No evidence of mitral stenosis.  6. The aortic valve is normal in structure. Aortic valve regurgitation is trivial. No aortic stenosis is present.  7. The inferior vena cava is dilated in size with <50% respiratory variability, suggesting right atrial pressure of 15 mmHg. FINDINGS  Left Ventricle: Left ventricular ejection fraction, by estimation, is 20 to 25%. The left ventricle has severely decreased function. The left ventricle demonstrates global hypokinesis. The left ventricular internal cavity size was mildly dilated. There is no left ventricular hypertrophy. Left ventricular diastolic parameters are consistent with Grade II diastolic dysfunction (pseudonormalization). Right Ventricle: The right ventricular size is normal. No increase in right ventricular wall thickness. Right ventricular systolic function is normal. Tricuspid regurgitation signal is inadequate for assessing PA pressure. Left Atrium: Left atrial size was mildly dilated. Right Atrium: Right atrial size was normal in size. Pericardium: A small pericardial effusion is present. The pericardial effusion is localized near the right atrium. There is no evidence of cardiac tamponade. Mitral Valve: The mitral valve is normal in structure. Mild mitral annular calcification. Trivial mitral valve regurgitation. No evidence of mitral valve stenosis. Tricuspid Valve: The tricuspid valve is normal in structure. Tricuspid valve regurgitation is trivial. No evidence of tricuspid stenosis. Aortic Valve: The aortic valve is normal in structure. Aortic valve regurgitation is trivial. No aortic stenosis is present. Pulmonic Valve: The pulmonic valve was normal in structure. Pulmonic valve regurgitation is not visualized. No evidence of pulmonic stenosis. Aorta: The aortic root is normal in size and structure. Venous: The inferior vena cava is dilated in size with less than 50% respiratory variability, suggesting  right atrial pressure of 15 mmHg. IAS/Shunts: No atrial level shunt detected by color flow Doppler.  LEFT VENTRICLE PLAX 2D LVIDd:         5.10 cm      Diastology LVIDs:         4.20 cm      LV e' medial:    3.59 cm/s LV PW:         0.90 cm      LV E/e' medial:  26.4 LV IVS:  0.90 cm      LV e' lateral:   4.68 cm/s LVOT diam:     2.00 cm      LV E/e' lateral: 20.3 LV SV:         32 LV SV Index:   21 LVOT Area:     3.14 cm  LV Volumes (MOD) LV vol d, MOD A2C: 90.4 ml LV vol d, MOD A4C: 106.0 ml LV vol s, MOD A2C: 74.7 ml LV vol s, MOD A4C: 69.0 ml LV SV MOD A2C:     15.7 ml LV SV MOD A4C:     106.0 ml LV SV MOD BP:      24.3 ml RIGHT VENTRICLE         IVC TAPSE (M-mode): 1.8 cm  IVC diam: 2.40 cm                          PULMONARY VEINS                         Diastolic Velocity: 34.50 cm/s                         S/D Velocity:       0.80                         Systolic Velocity:  26.60 cm/s LEFT ATRIUM             Index        RIGHT ATRIUM           Index LA diam:        3.50 cm 2.31 cm/m   RA Area:     10.50 cm LA Vol (A2C):   21.2 ml 13.97 ml/m  RA Volume:   23.20 ml  15.29 ml/m LA Vol (A4C):   47.1 ml 31.04 ml/m LA Biplane Vol: 32.3 ml 21.29 ml/m  AORTIC VALVE LVOT Vmax:   59.90 cm/s LVOT Vmean:  40.900 cm/s LVOT VTI:    0.103 m  AORTA Ao Root diam: 2.80 cm Ao Asc diam:  2.40 cm MITRAL VALVE MV Area (PHT): 4.65 cm    SHUNTS MV Decel Time: 163 msec    Systemic VTI:  0.10 m MV E velocity: 94.90 cm/s  Systemic Diam: 2.00 cm MV A velocity: 77.60 cm/s MV E/A ratio:  1.22 Toribio Fuel MD Electronically signed by Toribio Fuel MD Signature Date/Time: 06/28/2024/2:33:22 PM    Final    DG Chest 1 View Result Date: 06/27/2024 EXAM: 1 VIEW(S) XRAY OF THE CHEST 06/27/2024 02:05:00 PM COMPARISON: 05/13/2024 CLINICAL HISTORY: Chest pain. FINDINGS: LUNGS AND PLEURA: Bilateral pleural effusions are noted with associated atelectasis, left greater than right. No pneumothorax. HEART AND MEDIASTINUM: No  acute abnormality of the cardiac and mediastinal silhouettes. BONES AND SOFT TISSUES: No acute osseous abnormality. IMPRESSION: 1. Bilateral pleural effusions with associated atelectasis, left greater than right. Electronically signed by: Lynwood Seip MD 06/27/2024 02:25 PM EST RP Workstation: [id number]     Vernal Alstrom, MD  [facility] 06/29/2024  If 7PM-7AM, please contact night-coverage

## 2024-06-29 NOTE — Evaluation (Addendum)
 Physical Therapy Evaluation Patient Details Name: Brooke [name] MRN: [id number] DOB: Oct 15, 1960 Today's Date: 06/29/2024  History of Present Illness  Brooke [name] is a 63 y.o. female admitted 11/18  presented with intermittent chest pain and fluid overload.  Associated with exertional shortness of breath and bilateral lower extremity edema.  States she does not use salt in her diet and has been compliant with her home diuretic.   Serum glucose over 200.  BNP greater than 1800 and volume overload on exam.  Chest x-ray revealed bilateral pleural effusions, left greater than right.  Suspected CHF. At this time patient has been seen by cardiology and plan for cardiac catheterization 11/20. PMH: type 2 diabetes, hypertension, hyperlipidemia, coronary artery disease, peripheral artery disease, chronic HFpEF chronic indwelling Foley catheter use, history of dysphagia status post esophagus dilatation on 10/22/2022 by [facility], presented hospital with intermittent chest pain for the past 2 days and fluid overload.  Associated with exertional shortness of breath and bilateral lower extremity edema.  States she does not use salt in her diet and has been compliant with her home diuretic.  In the ED vitals were stable.  EKG was unremarkable.  High-sensitivity troponin negative x 2.  Serum glucose over 200.  BNP greater than 1800 and volume overload on exam.  Chest x-ray revealed bilateral pleural effusions, left greater than right.  Received Lasix  40 mg in the ED and was admitted hospital for further evaluation and treatment.  At this time patient has been seen by cardiology and plan for cardiac catheterization.  Clinical Impression  Pt admitted with above diagnosis. Pt was able to ambulate with RW around the bed short distance due to dizziness.  Pt was orthostatic with BP therefore limited treatment. Needed min assist due to dizziness and decr safety.  Pt will be alone during day as husband is in hospital and  daughters works however should progress and be able to go home with HHPT.  Pt currently with functional limitations due to the deficits listed below (see PT Problem List). Pt will benefit from acute skilled PT to increase their independence and safety with mobility to allow discharge.       Orthostatic BPs  Supine NT  Sitting 84 bpm, 103/66  Standing 86 bpm, 94/58  After walk 95 bpm, 82/52     End of treatment BP 107/71 with pt sitting in chair.  If plan is discharge home, recommend the following: A little help with walking and/or transfers;A little help with bathing/dressing/bathroom;Assistance with cooking/housework;Assist for transportation;Help with stairs or ramp for entrance   Can travel by private vehicle        Equipment Recommendations None recommended by PT  Recommendations for Other Services       Functional Status Assessment Patient has had a recent decline in their functional status and demonstrates the ability to make significant improvements in function in a reasonable and predictable amount of time.     Precautions / Restrictions Precautions Precautions: Fall Precaution/Restrictions Comments: had 3 falls in last 3 weeks, has foley catheter due to bladder surgery Restrictions Weight Bearing Restrictions Per Provider Order: No      Mobility  Bed Mobility Overal bed mobility: Independent                  Transfers Overall transfer level: Needs assistance Equipment used: Rolling walker (2 wheels) Transfers: Sit to/from Stand Sit to Stand: Contact guard assist           General transfer comment: Cues  for hand placement and pt with incr time to rise from bed.    Ambulation/Gait Ambulation/Gait assistance: Min assist Gait Distance (Feet): 15 Feet Assistive device: Rolling walker (2 wheels) Gait Pattern/deviations: Step-through pattern, Decreased stride length, Wide base of support, Drifts right/left   Gait velocity interpretation: <1.31 ft/sec,  indicative of household ambulator   General Gait Details: Pt needs cues and assist for safety.  Needs assist to steer RW and to stay close to RW. Pt only able to ambulate around bed.  BP dropping with activity and pt c/o slight dizziness therefore limited distance.  Stairs            Wheelchair Mobility     Tilt Bed    Modified Rankin (Stroke Patients Only)       Balance Overall balance assessment: Needs assistance Sitting-balance support: No upper extremity supported, Feet supported Sitting balance-Leahy Scale: Fair     Standing balance support: Bilateral upper extremity supported, During functional activity, Reliant on assistive device for balance Standing balance-Leahy Scale: Poor Standing balance comment: relies on UE support                             Pertinent Vitals/Pain Pain Assessment Pain Assessment: Faces Faces Pain Scale: Hurts little more Pain Location: under left breast Pain Descriptors / Indicators: Aching, Discomfort, Grimacing, Guarding Pain Intervention(s): Limited activity within patient's tolerance, Monitored during session, Repositioned    Home Living Family/patient expects to be discharged to:: Private residence Living Arrangements: Spouse/significant other;Children Available Help at Discharge: Family;Available 24 hours/day (daughter works 8 hours day) Type of Home: House Home Access: Stairs to enter Entrance Stairs-Rails: Right;Left (uses left rail) Secretary/administrator of Steps: 12   Home Layout: One level Home Equipment: BSC/3in1;Rollator (4 wheels) Additional Comments: Husband is in hospital with aorta problems    Prior Function Prior Level of Function : Independent/Modified Independent;Driving             Mobility Comments: Fall 3 weeks ago and 2 since due to swollen legs; no device used ADLs Comments: B/D self, daghter assists with food, pt does meds     Extremity/Trunk Assessment   Upper Extremity  Assessment Upper Extremity Assessment: Defer to OT evaluation    Lower Extremity Assessment Lower Extremity Assessment: Generalized weakness    Cervical / Trunk Assessment Cervical / Trunk Assessment: Normal  Communication   Communication Communication: No apparent difficulties    Cognition Arousal: Alert Behavior During Therapy: WFL for tasks assessed/performed   PT - Cognitive impairments: No apparent impairments                         Following commands: Intact       Cueing       General Comments General comments (skin integrity, edema, etc.): 100 % 2LO2; removed O2 and sats > 94% on RA however pt c/o SOB therefore replaced it at 2L.    Exercises General Exercises - Lower Extremity Ankle Circles/Pumps: AROM, Both, 10 reps, Supine Heel Slides: AROM, Both, 5 reps, Supine   Assessment/Plan    PT Assessment Patient needs continued PT services  PT Problem List Decreased balance;Decreased activity tolerance;Decreased mobility;Decreased knowledge of use of DME;Decreased safety awareness;Decreased knowledge of precautions;Cardiopulmonary status limiting activity       PT Treatment Interventions DME instruction;Gait training;Functional mobility training;Therapeutic activities;Therapeutic exercise;Stair training;Balance training;Patient/family education    PT Goals (Current goals can be found in the Care  Plan section)  Acute Rehab PT Goals Patient Stated Goal: to go home PT Goal Formulation: With patient Time For Goal Achievement: 07/13/24 Potential to Achieve Goals: Good    Frequency Min 2X/week     Co-evaluation               AM-PAC PT 6 Clicks Mobility  Outcome Measure Help needed turning from your back to your side while in a flat bed without using bedrails?: A Little Help needed moving from lying on your back to sitting on the side of a flat bed without using bedrails?: A Little Help needed moving to and from a bed to a chair (including a  wheelchair)?: A Little Help needed standing up from a chair using your arms (e.g., wheelchair or bedside chair)?: A Little Help needed to walk in hospital room?: Total Help needed climbing 3-5 steps with a railing? : Total 6 Click Score: 14    End of Session Equipment Utilized During Treatment: Gait belt;Oxygen Activity Tolerance: Patient limited by fatigue (dizziness) Patient left: in chair;with call bell/phone within reach;with chair alarm set Nurse Communication: Mobility status PT Visit Diagnosis: Muscle weakness (generalized) (M62.81)    Time: 9067-8995 PT Time Calculation (min) (ACUTE ONLY): 32 min   Charges:   PT Evaluation $PT Eval Moderate Complexity: 1 Mod PT Treatments $Gait Training: 8-22 mins PT General Charges $$ ACUTE PT VISIT: 1 Visit         Aniesha Haughn M,PT Acute Rehab Services [phone]   Stephane JULIANNA Bevel 06/29/2024, 10:53 AM

## 2024-06-29 NOTE — Interval H&P Note (Signed)
 History and Physical Interval Note:  06/29/2024 2:09 PM  Brooke [name]  has presented today for surgery, with the diagnosis of acute on chronic HFrEF.  The various methods of treatment have been discussed with the patient and family. After consideration of risks, benefits and other options for treatment, the patient has consented to  Procedure(s): RIGHT/LEFT HEART CATH AND CORONARY ANGIOGRAPHY (N/A) as a surgical intervention.  The patient's history has been reviewed, patient examined, no change in status, stable for surgery.  I have reviewed the patient's chart and labs.  Questions were answered to the patient's satisfaction.    Cath Lab Visit (complete for each Cath Lab visit)  Clinical Evaluation Leading to the Procedure:   ACS: No.  Non-ACS:    Anginal/Heart Failure Classification: NYHA class IV  Anti-ischemic medical therapy: Minimal Therapy (1 class of medications)  Non-Invasive Test Results: LVEF 20-25% by echo -> high risk  Prior CABG: No previous CABG  Brooke [name]

## 2024-06-29 NOTE — Progress Notes (Signed)
 Heart Failure Stewardship Pharmacist Progress Note   PCP: [name], MD PCP-Cardiologist: Oneil Parchment, MD    HPI:  63 yo F with PMH of CHF,   [facility] with [facility] team in 2023 during a hospitalization and was treated for cardiogenic shock. Bilateral pleural effusions and multivessel CAD were noted on CT of her chest, she was started on milrinone  due to diuresed with Lasix .  An echo at that time revealed severe biventricular dysfunction with a EF of 20 to 25%.  She underwent right left heart cath revealing severe multivessel CAD, not felt to be a CABG candidate secondary to poor targets, malnutrition and low LV systolic function.  She underwent a cardiac MRI revealed an EF of 24%.   She was started on GDMT.  Repeat echo in 04/2022 showed EF 55-60%, no regional wall motion abnormalities, grade 1 DD, normal RV systolic function, no significant valvular abnormalities. [facility] and has been following up with general cardiology since.   Admitted 05/2024 with pyelonephritis and DKA.  Was seen by cardiology outpatient on 11/17. She was not taking lasix  as it makes her feel bad. Noted to have shortness of breath, LE edema, and reported orthopnea. She was 6 lbs above dry weight.   Presented to the ED on 11/18 with persistent chest pain, shortness of breath, LE edema, dry cough, orthopnea/PND. CXR with bilateral pleural effusions with associated atelectasis. BNP elevated. ECHO 11/19 with LVEF 20-25%, global hypokinesis, G2DD, RV normal. Scheduled for [facility] later today.   Today she is feeling ok, states her breathing is getting better but not back to baseline yet. Remains on supplemental oxygen (does not require at home). Swelling is improving, still with 1+ pitting LE edema. Blood pressure is low on last check. Felt a little lightheadedness/dizziness when moving from the bed to the chair.   Current HF Medications: Diuretic:  furosemide  40 mg IV BID Beta Blocker: metoprolol  XL 12.5 mg daily ACE/ARB/ARNI: Entresto  24/26 mg BID MRA: spironolactone  12.5 mg daily  Prior to admission HF Medications: Diuretic: furosemide  20 mg daily Beta blocker: metoprolol  XL 12.5 mg daily ACE/ARB/ARNI: Entresto  49/51 mg BID MRA: spironolactone  25 mg BID  Pertinent Lab Values: Serum creatinine 0.96, BUN 7, Potassium 3.9, Sodium 141, BNP 1890.2, Magnesium  1.7, A1c 10.7 (04/2024)   Vital Signs: Weight: 110 lbs (admission weight: 110 lbs) Blood pressure: 110/60s  Heart rate: 70-80s  I/O: net -3L yesterday; net -3.5L since admission  Medication Assistance / Insurance Benefits Check: Does the patient have prescription insurance?  Yes Type of insurance plan: [place] [employer]  Outpatient Pharmacy:  Prior to admission outpatient pharmacy: [facility] Is the patient willing to use [facility] TOC pharmacy at discharge? Yes    Assessment: 1. Acute on chronic systolic CHF (LVEF 20-25%), R/LHC today. NYHA class III symptoms. - Agree with increasing to furosemide  40 mg IV BID. Strict I/Os and daily weights. Keep K>4 and Mg>2.  - Continue metoprolol  XL 12.5 mg daily - Restarted Entresto  at 24/26 mg BID (was taking Entresto  49/51 mg BID at home) today. May need to hold if BP remains low.  - Agree with adding spironolactone  12.5 mg daily (was taking spironolactone  25 mg BID at home) - No SGLT2i with chronic foley and DKA history    Plan: 1) Medication changes recommended at this time: - Hold Entresto  if BP does not improve  2) Patient assistance: - None pending  3)  Education  - Patient has  been educated on current HF medications and potential additions to HF medication regimen - Patient verbalizes understanding that over the next few months, these medication doses may change and more medications may be added to optimize HF regimen - Patient has been educated on basic disease state pathophysiology and goals of therapy   Duwaine Plant, PharmD, BCPS Heart Failure Stewardship Pharmacist Phone [phone]

## 2024-06-29 NOTE — H&P (View-Only) (Signed)
  Progress Note  Patient Name: Sherlynn Tourville Date of Encounter: 06/29/2024 [facility] Cardiologist: Oneil Parchment, MD   Interval Summary   No events overnight. She reports improvement dyspnea but does still report some LE edema.   Vital Signs Vitals:   06/28/24 2337 06/29/24 0428 06/29/24 0500 06/29/24 0724  BP: (!) 111/55 (!) 109/58  112/72  Pulse: 83 80  80  Resp: 18 19  18   Temp: 98.4 F (36.9 C) 98.5 F (36.9 C)  97.9 F (36.6 C)  TempSrc: Oral Oral  Oral  SpO2: 100% 100%  100%  Weight:   49.9 kg   Height:        Intake/Output Summary (Last 24 hours) at 06/29/2024 0837 Last data filed at 06/29/2024 0530 Gross per 24 hour  Intake 250 ml  Output 2500 ml  Net -2250 ml      06/29/2024    5:00 AM 06/27/2024    1:28 PM 06/26/2024    8:10 AM  Last 3 Weights  Weight (lbs) 110 lb 1.6 oz 110 lb 122 lb 9.6 oz  Weight (kg) 49.941 kg 49.896 kg 55.611 kg      Telemetry/ECG  NSR - Personally Reviewed  Physical Exam  GEN: No acute distress.   Neck: JVD Cardiac: RRR, no murmurs, rubs, or gallops.  Respiratory: Clear to auscultation bilaterally. 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 TTE 20-25%.    #Volume overload #CP #Known severe multivessel CAD #PAD s/p multiple interventions - Presented with CHF and CP and found to have newly reduced EF 20-25, likely ischemic given CAD history.  - Cont IV lasix  40 mg BID - Plan for LHC/RHC today - Cont aspirin , lipitor  80, XL 12.5, and Plavix  75 - Re-start Entresto  24-26 and spiro 12.5 today; No SGLT2 given history of recent pyelo - We will continue to follow  For  questions or updates, please contact [facility] Please consult [email or web address] for contact info under   Signed, Joelle VEAR Ren Donley, MD

## 2024-06-29 NOTE — Evaluation (Signed)
 Occupational Therapy Evaluation Patient Details Name: Brooke [name] MRN: [id number] DOB: February 20, 1961 Today's Date: 06/29/2024   History of Present Illness   Brooke [name] is a 63 y.o. female 06/27/24  presented with intermittent chest pain, associated with exertional shortness of breath and bilateral lower extremity edema.   Chest x-ray revealed bilateral pleural effusions, left greater than right.  Suspected CHF exacerbation, cardiac catheterization 11/20. PMH: type 2 diabetes, hypertension, hyperlipidemia, coronary artery disease, peripheral artery disease, chronic HFpEF chronic indwelling Foley catheter use, history of dysphagia.     Clinical Impressions Pt typically walks without AD and reports independence in ADLs (sponge bathes) and housekeeping. She rarely drives and reports her husband holds on to her when she walks outside the home. Her daughter assists with groceries and her husband does the cooking. Pt presents with generalized weakness, decreased activity tolerance and impaired balance. Mobility limited by orthostatic hypotension during earlier PT session which persists. Pt needs min assist and RW for transfers and set up to min assist for ADLs. Recommend HHOT upon discharge. Will follow acutely.      If plan is discharge home, recommend the following:   A little help with walking and/or transfers;A little help with bathing/dressing/bathroom;Assistance with cooking/housework;Assist for transportation;Help with stairs or ramp for entrance     Functional Status Assessment   Patient has had a recent decline in their functional status and demonstrates the ability to make significant improvements in function in a reasonable and predictable amount of time.     Equipment Recommendations   None recommended by OT     Recommendations for Other Services         Precautions/Restrictions   Precautions Precautions: Fall Precaution/Restrictions Comments: watch  BP Restrictions Weight Bearing Restrictions Per Provider Order: No     Mobility Bed Mobility               General bed mobility comments: received in chair    Transfers Overall transfer level: Needs assistance Equipment used: Rolling walker (2 wheels) Transfers: Sit to/from Stand Sit to Stand: Contact guard assist           General transfer comment: Cues for hand placement, increased time      Balance Overall balance assessment: Needs assistance   Sitting balance-Leahy Scale: Good     Standing balance support: Bilateral upper extremity supported, During functional activity, Reliant on assistive device for balance Standing balance-Leahy Scale: Poor Standing balance comment: relies on UE support                           ADL either performed or assessed with clinical judgement   ADL Overall ADL's : Needs assistance/impaired Eating/Feeding: NPO   Grooming: Set up;Sitting;Wash/dry hands;Wash/dry face   Upper Body Bathing: Set up;Sitting   Lower Body Bathing: Minimal assistance;Sit to/from stand   Upper Body Dressing : Set up;Sitting   Lower Body Dressing: Minimal assistance;Sit to/from stand   Toilet Transfer: Minimal assistance;Stand-pivot;Ambulation;Rolling walker (2 wheels)   Toileting- Clothing Manipulation and Hygiene: Minimal assistance;Sit to/from stand       Functional mobility during ADLs: Minimal assistance;Rolling walker (2 wheels)       Vision Baseline Vision/History: 1 Wears glasses Ability to See in Adequate Light: 0 Adequate Patient Visual Report: No change from baseline Additional Comments: readers     Perception         Praxis         Pertinent Vitals/Pain Pain Assessment Pain Assessment: Faces  Faces Pain Scale: Hurts little more Pain Location: under left breast Pain Descriptors / Indicators: Aching, Discomfort, Grimacing, Guarding Pain Intervention(s): Repositioned, Limited activity within patient's  tolerance     Extremity/Trunk Assessment Upper Extremity Assessment Upper Extremity Assessment: Generalized weakness;Right hand dominant   Lower Extremity Assessment Lower Extremity Assessment: Defer to PT evaluation   Cervical / Trunk Assessment Cervical / Trunk Assessment: Normal   Communication Communication Communication: No apparent difficulties   Cognition Arousal: Alert Behavior During Therapy: WFL for tasks assessed/performed Cognition: No apparent impairments                               Following commands: Intact       Cueing  General Comments   Cueing Techniques: Verbal cues  100 % 2LO2; removed O2 and sats > 94% on RA however pt c/o SOB therefore replaced it at 2L.   Exercises     Shoulder Instructions      Home Living Family/patient expects to be discharged to:: Private residence Living Arrangements: Spouse/significant other;Children Available Help at Discharge: Family;Available 24 hours/day;Other (Comment) (daughter is 23 and works) Type of Home: House Home Access: Stairs to enter Secretary/administrator of Steps: 12 Entrance Stairs-Rails: Right;Left Home Layout: One level     Bathroom Shower/Tub: Sponge bathes at baseline   [employer]: Standard     Home Equipment: BSC/3in1;Rollator (4 wheels)   Additional Comments: husband having health challenges, currently admitted      Prior Functioning/Environment Prior Level of Function : Independent/Modified Independent;History of Falls (last six months)             Mobility Comments: Fall 3 weeks ago and 2 since due to swollen legs; no device used ADLs Comments: B/D self, daughter assists with groceries, pt does meds, husband cooks, pt does cleaning    OT Problem List: Decreased strength;Decreased activity tolerance;Impaired balance (sitting and/or standing);Decreased knowledge of use of DME or AE   OT Treatment/Interventions: Self-care/ADL training;DME and/or AE  instruction;Energy conservation;Patient/family education;Balance training;Therapeutic activities      OT Goals(Current goals can be found in the care plan section)   Acute Rehab OT Goals OT Goal Formulation: With patient Time For Goal Achievement: 07/13/24 Potential to Achieve Goals: Good ADL Goals Pt Will Perform Grooming: with modified independence;standing Pt Will Perform Lower Body Bathing: with modified independence;sit to/from stand Pt Will Perform Lower Body Dressing: with modified independence;sit to/from stand Pt Will Transfer to Toilet: with modified independence;ambulating;regular height toilet Pt Will Perform Toileting - Clothing Manipulation and hygiene: with modified independence;sit to/from stand Additional ADL Goal #1: Pt will generalize energy conservation strategies in ADLs and mobility.   OT Frequency:  Min 2X/week    Co-evaluation              AM-PAC OT 6 Clicks Daily Activity     Outcome Measure Help from another person eating meals?: None Help from another person taking care of personal grooming?: A Little Help from another person toileting, which includes using toliet, bedpan, or urinal?: A Little Help from another person bathing (including washing, rinsing, drying)?: A Little Help from another person to put on and taking off regular upper body clothing?: A Little Help from another person to put on and taking off regular lower body clothing?: A Little 6 Click Score: 19   End of Session Equipment Utilized During Treatment: Gait belt;Rolling walker (2 wheels)  Activity Tolerance: Treatment limited secondary to medical complications (Comment) (  orthostatic) Patient left: in chair;with call bell/phone within reach;with chair alarm set  OT Visit Diagnosis: Unsteadiness on feet (R26.81);Other abnormalities of gait and mobility (R26.89);History of falling (Z91.81)                Time: 1130-1155 OT Time Calculation (min): 25 min Charges:  OT General  Charges $OT Visit: 1 Visit OT Evaluation $OT Eval Moderate Complexity: 1 Mod OT Treatments $Self Care/Home Management : 8-22 mins  Mliss HERO, OTR/L Acute Rehabilitation Services Office: [phone]   Kennth Mliss Helling 06/29/2024, 12:09 PM

## 2024-06-30 ENCOUNTER — Encounter (HOSPITAL_COMMUNITY)
Admission: EM | Disposition: A | Discharge status: Home / Self Care | Payer: Self-pay | Source: Home / Self Care | Attending: Internal Medicine

## 2024-06-30 ENCOUNTER — Other Ambulatory Visit: Payer: Self-pay

## 2024-06-30 DIAGNOSIS — E1165 Type 2 diabetes mellitus with hyperglycemia: Secondary | ICD-10-CM | POA: Diagnosis not present

## 2024-06-30 DIAGNOSIS — Z681 Body mass index (BMI) 19 or less, adult: Secondary | ICD-10-CM | POA: Diagnosis not present

## 2024-06-30 DIAGNOSIS — Z794 Long term (current) use of insulin: Secondary | ICD-10-CM | POA: Diagnosis not present

## 2024-06-30 DIAGNOSIS — E876 Hypokalemia: Secondary | ICD-10-CM | POA: Diagnosis not present

## 2024-06-30 DIAGNOSIS — I2510 Atherosclerotic heart disease of native coronary artery without angina pectoris: Secondary | ICD-10-CM | POA: Diagnosis not present

## 2024-06-30 DIAGNOSIS — E872 Acidosis, unspecified: Secondary | ICD-10-CM | POA: Diagnosis not present

## 2024-06-30 DIAGNOSIS — Z7902 Long term (current) use of antithrombotics/antiplatelets: Secondary | ICD-10-CM | POA: Diagnosis not present

## 2024-06-30 DIAGNOSIS — I110 Hypertensive heart disease with heart failure: Secondary | ICD-10-CM | POA: Diagnosis not present

## 2024-06-30 DIAGNOSIS — E46 Unspecified protein-calorie malnutrition: Secondary | ICD-10-CM | POA: Diagnosis not present

## 2024-06-30 DIAGNOSIS — E1151 Type 2 diabetes mellitus with diabetic peripheral angiopathy without gangrene: Secondary | ICD-10-CM | POA: Diagnosis not present

## 2024-06-30 DIAGNOSIS — I5021 Acute systolic (congestive) heart failure: Secondary | ICD-10-CM

## 2024-06-30 DIAGNOSIS — R64 Cachexia: Secondary | ICD-10-CM | POA: Diagnosis not present

## 2024-06-30 DIAGNOSIS — I5043 Acute on chronic combined systolic (congestive) and diastolic (congestive) heart failure: Secondary | ICD-10-CM | POA: Diagnosis not present

## 2024-06-30 DIAGNOSIS — I5023 Acute on chronic systolic (congestive) heart failure: Secondary | ICD-10-CM | POA: Diagnosis not present

## 2024-06-30 DIAGNOSIS — E7800 Pure hypercholesterolemia, unspecified: Secondary | ICD-10-CM | POA: Diagnosis not present

## 2024-06-30 HISTORY — PX: CORONARY ULTRASOUND/IVUS: CATH118244

## 2024-06-30 HISTORY — PX: CORONARY STENT INTERVENTION: CATH118234

## 2024-06-30 LAB — POCT I-STAT 7, (LYTES, BLD GAS, ICA,H+H)
Acid-Base Excess: 3 mmol/L — ABNORMAL HIGH (ref 0.0–2.0)
Bicarbonate: 27.6 mmol/L (ref 20.0–28.0)
Calcium, Ion: 1.14 mmol/L — ABNORMAL LOW (ref 1.15–1.40)
HCT: 29 % — ABNORMAL LOW (ref 36.0–46.0)
Hemoglobin: 9.9 g/dL — ABNORMAL LOW (ref 12.0–15.0)
O2 Saturation: 99 %
Potassium: 3.3 mmol/L — ABNORMAL LOW (ref 3.5–5.1)
Sodium: 138 mmol/L (ref 135–145)
TCO2: 29 mmol/L (ref 22–32)
pCO2 arterial: 42.2 mmHg (ref 32–48)
pH, Arterial: 7.423 (ref 7.35–7.45)
pO2, Arterial: 139 mmHg — ABNORMAL HIGH (ref 83–108)

## 2024-06-30 LAB — BASIC METABOLIC PANEL WITH GFR
Anion gap: 9 (ref 5–15)
BUN: 10 mg/dL (ref 8–23)
CO2: 28 mmol/L (ref 22–32)
Calcium: 7.9 mg/dL — ABNORMAL LOW (ref 8.9–10.3)
Chloride: 98 mmol/L (ref 98–111)
Creatinine, Ser: 0.65 mg/dL (ref 0.44–1.00)
GFR, Estimated: >60 mL/min (ref >60)
Glucose, Bld: 176 mg/dL — ABNORMAL HIGH (ref 70–99)
Potassium: 3.2 mmol/L — ABNORMAL LOW (ref 3.5–5.1)
Sodium: 135 mmol/L (ref 135–145)

## 2024-06-30 LAB — GLUCOSE, CAPILLARY
Glucose-Capillary: 174 mg/dL — ABNORMAL HIGH (ref 70–99)
Glucose-Capillary: 186 mg/dL — ABNORMAL HIGH (ref 70–99)
Glucose-Capillary: 165 mg/dL — ABNORMAL HIGH (ref 70–99)
Glucose-Capillary: 132 mg/dL — ABNORMAL HIGH (ref 70–99)
Glucose-Capillary: 164 mg/dL — ABNORMAL HIGH (ref 70–99)

## 2024-06-30 LAB — POCT ACTIVATED CLOTTING TIME
Activated Clotting Time: 256 s
Activated Clotting Time: 377 s
Activated Clotting Time: 239 s

## 2024-06-30 LAB — LIPOPROTEIN A (LPA): Lipoprotein (a): 222.2 nmol/L — ABNORMAL HIGH (ref <75.0)

## 2024-06-30 LAB — MAGNESIUM: Magnesium: 1.7 mg/dL (ref 1.7–2.4)

## 2024-06-30 MED ORDER — SPIRONOLACTONE 25 MG PO TABS
25.0000 mg | ORAL_TABLET | Freq: Every day | ORAL | Status: DC
  Administered 2024-06-30 – 2024-07-01 (×2): 25 mg via ORAL
  Filled 2024-06-30 (×2): qty 1

## 2024-06-30 MED ORDER — MIDAZOLAM HCL 2 MG/2ML IJ SOLN
INTRAMUSCULAR | Status: AC
  Filled 2024-06-30: qty 2

## 2024-06-30 MED ORDER — FUROSEMIDE 40 MG PO TABS
40.0000 mg | ORAL_TABLET | Freq: Every day | ORAL | Status: DC
  Administered 2024-06-30: 40 mg via ORAL
  Filled 2024-06-30: qty 1

## 2024-06-30 MED ORDER — HEPARIN SODIUM (PORCINE) 1000 UNIT/ML IJ SOLN
INTRAMUSCULAR | Status: DC | PRN
  Administered 2024-06-30: 5000 [IU] via INTRAVENOUS
  Administered 2024-06-30: 2000 [IU] via INTRAVENOUS

## 2024-06-30 MED ORDER — SODIUM CHLORIDE 0.9% FLUSH: 3.0000 mL | INTRAVENOUS | Status: DC | PRN

## 2024-06-30 MED ORDER — SODIUM CHLORIDE 0.9% FLUSH
3.0000 mL | Freq: Two times a day (BID) | INTRAVENOUS | Status: DC
  Administered 2024-06-30 – 2024-07-01 (×2): 3 mL via INTRAVENOUS

## 2024-06-30 MED ORDER — METOPROLOL SUCCINATE ER 25 MG PO TB24
25.0000 mg | ORAL_TABLET | Freq: Every day | ORAL | Status: DC
  Administered 2024-06-30 – 2024-07-01 (×2): 25 mg via ORAL
  Filled 2024-06-30 (×2): qty 1

## 2024-06-30 MED ORDER — POTASSIUM CHLORIDE 10 MEQ/100ML IV SOLN: 10.0000 meq | INTRAVENOUS | Status: DC

## 2024-06-30 MED ORDER — ASPIRIN 81 MG PO CHEW
162.0000 mg | CHEWABLE_TABLET | Freq: Once | ORAL | Status: AC
  Administered 2024-06-30: 162 mg via ORAL

## 2024-06-30 MED ORDER — CEFAZOLIN SODIUM-DEXTROSE 2-4 GM/100ML-% IV SOLN
INTRAVENOUS | Status: AC
  Filled 2024-06-30: qty 100

## 2024-06-30 MED ORDER — INSULIN ASPART 100 UNIT/ML IJ SOLN
0.0000 [IU] | Freq: Three times a day (TID) | INTRAMUSCULAR | Status: DC
  Administered 2024-07-01: 2 [IU] via SUBCUTANEOUS
  Administered 2024-07-01: 1 [IU] via SUBCUTANEOUS
  Filled 2024-06-30: qty 2
  Filled 2024-06-30: qty 1

## 2024-06-30 MED ORDER — HEPARIN (PORCINE) IN NACL 2000-0.9 UNIT/L-% IV SOLN
INTRAVENOUS | Status: DC | PRN
Start: 2024-06-30 — End: 2024-06-30
  Administered 2024-06-30: 1000 mL

## 2024-06-30 MED ORDER — ASPIRIN 81 MG PO CHEW
CHEWABLE_TABLET | ORAL | Status: AC
  Filled 2024-06-30: qty 2

## 2024-06-30 MED ORDER — CHLORHEXIDINE GLUCONATE CLOTH 2 % EX PADS
6.0000 | MEDICATED_PAD | Freq: Every day | CUTANEOUS | Status: DC
  Administered 2024-06-30 – 2024-07-01 (×2): 6 via TOPICAL

## 2024-06-30 MED ORDER — HEPARIN SODIUM (PORCINE) 1000 UNIT/ML IJ SOLN
INTRAMUSCULAR | Status: AC
  Filled 2024-06-30: qty 10

## 2024-06-30 MED ORDER — ASPIRIN 81 MG PO CHEW
CHEWABLE_TABLET | ORAL | Status: DC | PRN
Start: 2024-06-30 — End: 2024-06-30
  Administered 2024-06-30: 162 mg via ORAL

## 2024-06-30 MED ORDER — FENTANYL CITRATE (PF) 100 MCG/2ML IJ SOLN
INTRAMUSCULAR | Status: AC
  Filled 2024-06-30: qty 2

## 2024-06-30 MED ORDER — SODIUM CHLORIDE 0.9 % IV SOLN: 250.0000 mL | INTRAVENOUS | Status: AC | PRN

## 2024-06-30 MED ORDER — FREE WATER: 500.0000 mL | Freq: Once | Status: DC

## 2024-06-30 MED ORDER — FENTANYL CITRATE (PF) 100 MCG/2ML IJ SOLN
INTRAMUSCULAR | Status: DC | PRN
  Administered 2024-06-30: 25 ug via INTRAVENOUS

## 2024-06-30 MED ORDER — NITROGLYCERIN 1 MG/10 ML FOR IR/CATH LAB
INTRA_ARTERIAL | Status: AC
  Filled 2024-06-30: qty 10

## 2024-06-30 MED ORDER — POTASSIUM CHLORIDE CRYS ER 20 MEQ PO TBCR
40.0000 meq | EXTENDED_RELEASE_TABLET | Freq: Two times a day (BID) | ORAL | Status: AC
  Administered 2024-06-30 (×2): 40 meq via ORAL
  Filled 2024-06-30 (×2): qty 2

## 2024-06-30 MED ORDER — IOHEXOL 350 MG/ML SOLN
INTRAVENOUS | Status: DC | PRN
Start: 2024-06-30 — End: 2024-06-30
  Administered 2024-06-30: 145 mL

## 2024-06-30 MED ORDER — FREE WATER
250.0000 mL | Freq: Once | Status: AC
  Administered 2024-06-30: 250 mL via ORAL

## 2024-06-30 MED ORDER — MIDAZOLAM HCL (PF) 2 MG/2ML IJ SOLN
INTRAMUSCULAR | Status: DC | PRN
Start: 2024-06-30 — End: 2024-06-30
  Administered 2024-06-30: 1 mg via INTRAVENOUS

## 2024-06-30 MED ORDER — CEFAZOLIN SODIUM-DEXTROSE 2-3 GM-%(50ML) IV SOLR
INTRAVENOUS | Status: AC | PRN
  Administered 2024-06-30: 2 g via INTRAVENOUS

## 2024-06-30 SURGICAL SUPPLY — 21 items
BALLOON SAPPHIRE NC24 3.5X12 (BALLOONS) IMPLANT
CATH IV OPTICROSS HD 3FRX135 (CATHETERS) IMPLANT
CATH LAUNCHER 6FR AL1 (CATHETERS) IMPLANT
CATH VISTA GUIDE 6FR JL3.5 MPK (CATHETERS) IMPLANT
CATH VISTA GUIDE 6FR XBLAD3.0 (CATHETERS) IMPLANT
CLOSURE PERCLOSE PROSTYLE (Vascular Products) IMPLANT
DRAPE IVUS SLED (BAG) IMPLANT
ELECT DEFIB PAD ADLT CADENCE (PAD) IMPLANT
KIT ENCORE 26 ADVANTAGE (KITS) IMPLANT
KIT HEMO VALVE WATCHDOG (MISCELLANEOUS) IMPLANT
KIT MICROPUNCTURE NIT STIFF (SHEATH) IMPLANT
PACK CARDIAC CATHETERIZATION (CUSTOM PROCEDURE TRAY) ×1 IMPLANT
SET ATX-X65L (MISCELLANEOUS) IMPLANT
SHEATH PINNACLE 5F 10CM (SHEATH) IMPLANT
SHEATH PINNACLE 6F 10CM (SHEATH) IMPLANT
SHEATH PROBE COVER 6X72 (BAG) IMPLANT
STENT MEGATRON 3.5X8 (Permanent Stent) IMPLANT
WIRE ASAHI PROWATER 180CM (WIRE) IMPLANT
WIRE EMERALD 3MM-J .035X150CM (WIRE) IMPLANT
WIRE MICROINTRODUCER 60CM (WIRE) IMPLANT
WIRE RUNTHROUGH .014X180CM (WIRE) IMPLANT

## 2024-06-30 NOTE — Progress Notes (Signed)
  Progress Note Patient Name: Brooke [name] Date of Encounter: 06/30/2024 [facility] Cardiologist: Oneil Parchment, MD   Interval Summary   No events overnight. She reports significant improvement in dyspnea.   Vital Signs Vitals:   06/29/24 1630 06/29/24 2334 06/30/24 0300 06/30/24 0348  BP: 111/75 (!) 108/53 120/87   Pulse: 80 81 83 80  Resp: 18 17 17    Temp:  98.8 F (37.1 C) 98.9 F (37.2 C)   TempSrc:  Oral Oral   SpO2: 100% 100% 100% 100%  Weight:    50.6 kg  Height:        Intake/Output Summary (Last 24 hours) at 06/30/2024 0649 Last data filed at 06/29/2024 2338 Gross per 24 hour  Intake 487 ml  Output 2575 ml  Net -2088 ml      06/30/2024    3:48 AM 06/29/2024    5:00 AM 06/27/2024    1:28 PM  Last 3 Weights  Weight (lbs) 111 lb 8.8 oz 110 lb 1.6 oz 110 lb  Weight (kg) 50.6 kg 49.941 kg 49.896 kg      Telemetry/ECG  NSR - Personally Reviewed  Physical Exam  GEN: No acute distress.   Neck: No JVD Cardiac: RRR, no murmurs, rubs, or gallops.  Respiratory: Clear to auscultation bilaterally. 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 TTE 20-25%.    #Volume overload #CP #Known severe multivessel CAD #PAD s/p multiple interventions - RHC/LHC on 11/20 with normal filling pressures and obstructive CAD stable from prior.  - IC discussed her case and sounds like the plan for LM PCI. Discussed with patient and she is agreeable.  - Cont aspirin , lipitor  80, and Plavix  75 - Transition to PO 40 mg lasix  daily - Increase XL to 25 and spiro to 25; cont Entresto  24-26; No SGLT2 given history of recent pyelo - We will  continue to follow  For questions or updates, please contact [facility] Please consult [email or web address] for contact info under   Signed, Joelle VEAR Ren Donley, MD

## 2024-06-30 NOTE — Consult Note (Signed)
 Cardiology Consultation   Patient ID: Brooke [name] MRN: [id number]; DOB: 13-Aug-1960  Admit date: 06/27/2024 Date of Consult: 06/30/2024  PCP:  Brooke Clam, MD   [facility] Providers Cardiologist:  Brooke Parchment, MD        Patient Profile:   Brooke [name] is a 63 y.o. female with a hx of hypertension, hypercholesterolemia, type 2 diabetes mellitus, peripheral arterial disease and severe multivessel coronary artery disease by cardiac catheterization in March 2023 where her EF was 20 to 25% and due to diffuse coronary disease, patient's body habitus and frail nature, was recommended medical therapy and turndown for CABG. PAD s/p angioplasty of left popliteal artery and posterior tibial artery (12/24), uncontrolled DM c/b DKA and recent admission for pyelonephritis c/b nephrostomy tube presenting with CHF and CP.   Post GDMT, repeat echocardiogram in September 23 revealed normalization of LVEF and she had been doing relatively well. Recently has had worsening symptoms of dyspnea and chest pain admitted for heart failure and unstable angina, echocardiogram revealing marked decrease in LVEF with global hypokinesis.  She was aggressively diuresed, underwent right and left heart catheterization yesterday revealing again well compensated heart failure with severe multivessel disease including critical left main disease.  Her clinical status and angiographic findings were discussed in detail this morning on Heart Team meeting and it was decided that proceeding with PCI to unprotected left main ostium would be the best option.  I am consulting the patient to discuss options of revascularization, medical therapy as requested by Dr. Ren [name], Brooke.  Patient states that she feels well this morning, states that her dyspnea has improved.  Unfortunately her husband is also admitted to the hospital for severe aortic stenosis with markedly reduced LVEF.  Her interview and discussions  regarding PCI was done face-to-face and daughter was present over the telephone to discuss and answer any questions.   Past Medical History:  Diagnosis Date   Abdominal pain, left lower quadrant 12/17/2015   Anemia    Anxiety 07/01/2016   Carotid artery disease 10/27/2022   Chest pain, unspecified 10/05/2003   CHF (congestive heart failure) (HCC)    Chronic systolic heart failure (HCC) 02/20/2022   Depression, major, recurrent, moderate (HCC) 07/31/2005   Diabetes mellitus without complication (HCC) 09/08/2006   Endometriosis 03/28/1996   GERD (gastroesophageal reflux disease)    Heart murmur    History of colonic polyps 08/07/2015   20 mm removed 05/10/15 by Dr. Kristie     PAD (peripheral artery disease)    Unspecified constipation 06/24/2004   Inpatient Medications: Scheduled Meds:  aspirin   81 mg Oral QHS   atorvastatin   80 mg Oral Daily   clopidogrel   75 mg Oral Daily   enoxaparin  (LOVENOX ) injection  40 mg Subcutaneous Q24H   free water   250 mL Oral Once   furosemide   40 mg Oral Daily   insulin  aspart  0-6 Units Subcutaneous TID WC   insulin  glargine-yfgn  5 Units Subcutaneous BID   metoprolol  succinate  25 mg Oral Daily   potassium chloride   40 mEq Oral BID   sacubitril -valsartan   1 tablet Oral BID   sodium chloride  flush  3 mL Intravenous Q12H   spironolactone   25 mg Oral Daily   Continuous Infusions:  sodium chloride      PRN Meds: sodium chloride , acetaminophen , melatonin, polyethylene glycol, prochlorperazine , sodium chloride  flush  Allergies:    Allergies  Allergen Reactions   Bayer Aspirin  [Aspirin ] Other (See Comments)    Stomach pain  occurs with 325mg  dose, able to tolerate 81mg .   Darvon-N [Propoxyphene] Rash and Other (See Comments)    Darvocet    Ery-Tab [Erythromycin] Nausea And Vomiting   Glucophage  [Metformin ] Diarrhea, Nausea Only and Other (See Comments)    General GI intolerance    Motrin [Ibuprofen] Other (See Comments)    Stomach pain    Neurontin [Gabapentin] Other (See Comments)    Suicidal ideations    Social History:   Social History   Tobacco Use   Smoking status: Never   Smokeless tobacco: Never  Substance Use Topics   Alcohol use: No  Marital status: Married    Family History:    Family History  Problem Relation Age of Onset   Diabetes Mother    Hypertension Mother    Arthritis Mother        RA   Pancreatic cancer Father    Stomach cancer Neg Hx    Colon cancer Neg Hx    Esophageal cancer Neg Hx    Rectal cancer Neg Hx      ROS:  Review of Systems  Cardiovascular:  Positive for dyspnea on exertion. Negative for chest pain and leg swelling.    Physical Exam    Vitals:   06/30/24 0300 06/30/24 0348 06/30/24 0757 06/30/24 0804  BP: 120/87  (!) 101/57   Pulse: 83 80 81   Resp: 17  18   Temp: 98.9 F (37.2 C)  99.1 F (37.3 C)   TempSrc: Oral  Oral   SpO2: 100% 100% 100% 100%  Weight:  50.6 kg    Height:       Physical Exam Constitutional:      Appearance: She is underweight.  Neck:     Vascular: No carotid bruit or JVD.  Cardiovascular:     Rate and Rhythm: Normal rate and regular rhythm.     Pulses:          Dorsalis pedis pulses are 1+ on the right side and 1+ on the left side.       Posterior tibial pulses are 1+ on the right side and 1+ on the left side.     Heart sounds: Normal heart sounds. No murmur heard.    No gallop.  Pulmonary:     Effort: Pulmonary effort is normal.     Breath sounds: Normal breath sounds.  Abdominal:     General: Bowel sounds are normal.     Palpations: Abdomen is soft.  Musculoskeletal:     Right lower leg: No edema.     Left lower leg: No edema.      Intake/Output Summary (Last 24 hours) at 06/30/2024 1205 Last data filed at 06/30/2024 0800 Gross per 24 hour  Intake 727 ml  Output 2875 ml  Net -2148 ml      06/30/2024    3:48 AM 06/29/2024    5:00 AM 06/27/2024    1:28 PM  Last 3 Weights  Weight (lbs) 111 lb 8.8 oz 110 lb 1.6 oz  110 lb  Weight (kg) 50.6 kg 49.941 kg 49.896 kg     Net IO Since Admission: -5,598 mL [06/30/24 1205]  Labs   Lab Results  Component Value Date   NA 135 06/30/2024   K 3.2 (L) 06/30/2024   CO2 28 06/30/2024   GLUCOSE 176 (H) 06/30/2024   BUN 10 06/30/2024   CREATININE 0.65 06/30/2024   CALCIUM  7.9 (L) 06/30/2024   EGFR 89 06/26/2024   GFRNONAA >60 06/30/2024  Latest Ref Rng & Units 06/30/2024    7:10 AM 06/29/2024    2:56 PM 06/29/2024    2:53 PM  BMP  Glucose 70 - 99 mg/dL 823     BUN 8 - 23 mg/dL 10     Creatinine 9.55 - 1.00 mg/dL 9.34     Sodium 864 - 854 mmol/L 135  143  145   Potassium 3.5 - 5.1 mmol/L 3.2  2.9  2.9   Chloride 98 - 111 mmol/L 98     CO2 22 - 32 mmol/L 28     Calcium  8.9 - 10.3 mg/dL 7.9          Latest Ref Rng & Units 06/29/2024    2:56 PM 06/29/2024    2:53 PM 06/29/2024    2:47 AM  CBC  WBC 4.0 - 10.5 K/uL   6.6   Hemoglobin 12.0 - 15.0 g/dL 88.7  88.7  89.7   Hematocrit 36.0 - 46.0 % 33.0  33.0  32.6   Platelets 150 - 400 K/uL   339     Lab Results  Component Value Date   CHOL 122 06/29/2024   HDL 33 (L) 06/29/2024   LDLCALC 75 06/29/2024   TRIG 72 06/29/2024   CHOLHDL 3.7 06/29/2024    Lab Results  Component Value Date   TSH 2.060 10/09/2021    Lab Results  Component Value Date   HGBA1C 10.7 (H) 04/20/2024   High Sensitivity Troponin:   Recent Labs  Lab 06/27/24 1336 06/27/24 1558  TROPONINIHS 10 11    Cardiac Panel (last 3 results) Recent Labs    06/27/24 1336 06/27/24 1558  TROPONINIHS 10 11   BNP (last 3 results) Recent Labs    12/26/23 1624 05/11/24 2100 06/27/24 1336  BNP 119.1* 2,255.1* 1,890.2*   ProBNP (last 3 results) Recent Labs    05/13/24 0308 06/26/24 0903  PROBNP 7,997.0* 88,126*    Dimer No results for input(s): DDIMER in the last 168 hours.  Tele/EKG/Cardiac studies    EKG:  EKG 06/27/2024: Normal sinus rhythm at rate of 97 bpm, normal axis, poor R progression, cannot  exclude anteroseptal infarct old.  High lateral ST depression with T wave inversion, lateral ischemia.  ECHOCARDIOGRAM COMPLETE 06/28/2024  1. Left ventricular ejection fraction, by estimation, is 20 to 25%. The left ventricle has severely decreased function. The left ventricle demonstrates global hypokinesis. The left ventricular internal cavity size was mildly dilated. Left ventricular diastolic parameters are consistent with Grade II diastolic dysfunction (pseudonormalization). 2. Right ventricular systolic function is normal. The right ventricular size is normal. Tricuspid regurgitation signal is inadequate for assessing PA pressure. 3. Left atrial size was mildly dilated. 4. A small pericardial effusion is present. The pericardial effusion is localized near the right atrium. There is no evidence of cardiac tamponade.  Cardiac catheterization 06/29/2024:   Relatively stable appearance of severe multivessel CAD, as detailed below, compared to last catheterization in 09/2021. Normal left and right heart filling pressures. Normal Fick cardiac output/index.  Radiology  Imaging results have been reviewed  Assessment & Plan .     1.  Unstable angina pectoris with negative cardiac markers 2.  Acute systolic heart failure 3.  Critical left main and multivessel coronary artery disease and occluded RCA. 4.  Diabetes mellitus type 2 uncontrolled with hyperglycemia.  Recommendation: I had an extensive discussion with the patient and her daughter who was on the telephone during the entire discussion and physical exam and evaluation, unfortunately  patient's husband is also admitted to the hospital with acute decompensated heart failure with severe AS and nonischemic cardiomyopathy with reduced EF.  Patient was given the option of continued medical therapy as she had done well in the past.  We also discussed regarding heart team recommendation to proceed with ostial left main unprotected  stenting.  Patient states that she has had extensive discussion with Dr. Ren [name], Brooke yesterday and also this morning and that she has decided to proceed with PCI.  I discussed with the patient that it is high risk with a mortality of at least 2 to 3% and that in case there is complication and no flow into the left main, her death would be pretty certain.  At this point we would not be performing CPR.  Patient completely understands this and states that she is in the best form right now to proceed as she has decided that something needs to be done as she feels her quality of life has become very poor recently with marked dyspnea on doing given activities of daily living.  We will proceed with high risk unprotected left main PCI today.  Patient is aware of 2 to 3% risk of death.  She is also aware of <1 to 2% risk of bleeding, need for blood transfusion, renal failure, infection but not limited to these.   Gordy Bergamo, MD, [facility] 06/30/2024, 12:05 PM [facility] [address] [place], [place] [address] Phone: [phone]. Fax:  [phone]

## 2024-06-30 NOTE — Progress Notes (Signed)
 PROGRESS NOTE  Brooke [name] FMW:[id number] DOB: February 03, 1961 DOA: 06/27/2024 PCP: [name], MD   LOS: 2 days   Brief narrative:  Brooke [name] is a 63 y.o. female with medical history significant for type 2 diabetes, hypertension, hyperlipidemia, coronary artery disease, peripheral artery disease, chronic HFpEF chronic indwelling Foley catheter use, history of dysphagia status post esophagus dilatation on 10/22/2022 by [facility], presented hospital with intermittent chest pain for the past 2 days and fluid overload.  Associated with exertional shortness of breath and bilateral lower extremity edema.  States she does not use salt in her diet and has been compliant with her home diuretic.  In the ED vitals were stable.  EKG was unremarkable.  High-sensitivity troponin negative x 2.  Serum glucose over 200.  BNP greater than 1800 and volume overload on exam.  Chest x-ray revealed bilateral pleural effusions, left greater than right.  Received Lasix  40 mg in the ED and was admitted hospital for further evaluation and treatment.  At this time, patient has been seen by cardiology and doing cardiac catheterization 06/29/2024 yesterday and plan for PCI of left main 06/30/2024.    Assessment/Plan: Principal Problem:   Acute on chronic congestive heart failure (HCC) Active Problems:   Acute systolic heart failure (HCC)  Acute on chronic HFpEF Patient presented with  BNP greater than 1800, bilateral lower extremity pitting edema, bilateral pleural effusions, left greater than right.  Review of previous 2D echocardiogram from 10/15/2021 showed LV ejection fraction of 20 to 25%.  2D echocardiogram done this admission showed LV ejection fraction of 20 to 25% with global hypokinesis and grade 2 diastolic dysfunction.  Cardiology has been consulted and patient underwent cardiac catheterization on 06/29/2024 for ongoing decompensation and chest discomfort and was noted to have multivessel coronary artery  disease.  Plan for PCI of left main today as per cardiology.  Patient had been seen at the cardiology clinic 2 days prior to presentation patient is not a candidate for SGLT 2 due to recent history of pyelonephritis and chronic Foley usage.  Follow cardiology recommendations.  History of coronary artery disease status post cath 2023, mild chest discomfort.  Cardiac catheterization on 06/29/2024 showed severe multivessel disease.  Continue aspirin  and Plavix  Lipitor  Lasix  orally while in the hospital.  At this time cardiology planning for PCI to left main today.  Hypertension.  Patient is on Entresto  metoprolol  spironolactone  as outpatient.  Blood pressure seems to be stable.  Continue while in the hospital.   Bilateral pleural effusions secondary to congestive heart failure Continue oral Lasix , incentive spirometry.  Currently on 1 L of oxygen by nasal cannula.   Type 2 diabetes with hyperglycemia Review of last hemoglobin A1c 10.7 on 04/20/2024.  Had hyperglycemia on presentation.  Continue sliding scale insulin  and long-acting insulin  Accu-Cheks diabetic diet.  Being followed by PCP as outpatient.  On insulin  as outpatient.  Farxiga  on hold due to recent DKA UTI/pyelonephritis.  Diabetic coordinator on board and insulin  dose has been adjusted.   Mild non-anion gap metabolic acidosis Improved   Hyperlipidemia Peripheral artery disease Continue Aspirin /Plavix , Lipitor , Toprol -XL   Generalized weakness Continue PT OT when cardiac stable.  Hypokalemia.  Potassium of 3.2 today.  Patient is n.p.o. will replenish through IV today.   DVT prophylaxis: enoxaparin  (LOVENOX ) injection 40 mg Start: 06/30/24 0800   Disposition: Home likely in 1 to 2 days when okay with cardiology  Status is: Inpatient Remains inpatient appropriate because: Pending clinical improvement, need for diuresis, status post  cardiac catheterization and plan for PCI today.    Code Status:     Code Status: Full  Code  Family Communication: None at bedside  Consultants: Cardiology  Procedures: Cardiac catheterization 06/29/2024  Anti-infectives:  None  Anti-infectives (From admission, onward)    None       Subjective: Today, patient was seen and examined at bedside.  Patient feels a little better with breathing but had some chest discomfort.  Denies any nausea vomiting fever chills or rigor.    Objective: Vitals:   06/30/24 0757 06/30/24 0804  BP: (!) 101/57   Pulse: 81   Resp: 18   Temp: 99.1 F (37.3 C)   SpO2: 100% 100%    Intake/Output Summary (Last 24 hours) at 06/30/2024 1022 Last data filed at 06/30/2024 0800 Gross per 24 hour  Intake 727 ml  Output 2875 ml  Net -2148 ml   Filed Weights   06/27/24 1328 06/29/24 0500 06/30/24 0348  Weight: 49.9 kg 49.9 kg 50.6 kg   Body mass index is 19.15 kg/m.   Physical Exam:  GENERAL: Patient is alert awake and oriented. Not in obvious distress.  Thinly built, on nasal cannula oxygen HENT: No scleral pallor or icterus. Pupils equally reactive to light. Oral mucosa is moist NECK: is supple, no gross swelling noted. CHEST: Decreased breath sounds bilaterally, clear breath sounds. CVS: S1 and S2 heard, no murmur. Regular rate and rhythm.  ABDOMEN: Soft, non-tender, bowel sounds are present. EXTREMITIES: Trace lower extremity edema CNS: Cranial nerves are intact. No focal motor deficits. SKIN: warm and dry without rashes.  Data Review: I have personally reviewed the following laboratory data and studies,  CBC: Recent Labs  Lab 06/27/24 1336 06/28/24 0515 06/29/24 0247 06/29/24 1453 06/29/24 1456  WBC 7.2 8.1 6.6  --   --   NEUTROABS 5.0  --   --   --   --   HGB 9.7* 10.5* 10.2* 11.2* 11.2*  HCT 32.1* 33.8* 32.6* 33.0* 33.0*  MCV 80.7 79.7* 78.9*  --   --   PLT 276 338 339  --   --    Basic Metabolic Panel: Recent Labs  Lab 06/26/24 0903 06/26/24 0903 06/27/24 1336 06/28/24 0515 06/29/24 0247  06/29/24 1453 06/29/24 1456 06/30/24 0710  NA 136   < > 140 140 141 145 143 135  K 4.3  --  3.8 4.3 3.9 2.9* 2.9* 3.2*  CL 99  --  108 103 101  --   --  98  CO2 24  --  21* 24 27  --   --  28  GLUCOSE 265*  --  206* 86 75  --   --  176*  BUN 6*  --  7* 7* 7*  --   --  10  CREATININE 0.76  --  0.71 0.93 0.96  --   --  0.65  CALCIUM  9.0  --  7.5* 9.0 8.3*  --   --  7.9*  MG  --   --   --  1.7 1.7  --   --  1.7  PHOS  --   --   --  3.2 3.9  --   --   --    < > = values in this interval not displayed.   Liver Function Tests: Recent Labs  Lab 06/26/24 0903 06/27/24 1336  AST 10 21  ALT 8 10  ALKPHOS 119 86  BILITOT 0.4 1.0  PROT 7.0 6.3*  ALBUMIN 3.6*  2.6*   No results for input(s): LIPASE, AMYLASE in the last 168 hours. No results for input(s): AMMONIA in the last 168 hours. Cardiac Enzymes: No results for input(s): CKTOTAL, CKMB, CKMBINDEX, TROPONINI in the last 168 hours. BNP (last 3 results) Recent Labs    12/26/23 1624 05/11/24 2100 06/27/24 1336  BNP 119.1* 2,255.1* 1,890.2*    ProBNP (last 3 results) Recent Labs    05/13/24 0308 06/26/24 0903  PROBNP 7,997.0* 11,873*    CBG: Recent Labs  Lab 06/29/24 1610 06/29/24 1728 06/29/24 2039 06/30/24 0613 06/30/24 0833  GLUCAP 64* 70 105* 174* 186*   No results found for this or any previous visit (from the past 240 hours).   Studies: CARDIAC CATHETERIZATION Result Date: 06/29/2024   Mid LAD lesion is 80% stenosed.   Prox LAD lesion is 80% stenosed.   Mid Cx lesion is 70% stenosed.   Prox RCA lesion is 100% stenosed.   2nd Diag lesion is 85% stenosed.   1st Diag lesion is 99% stenosed.   Ost LM lesion is 70% stenosed.   LV end diastolic pressure is normal.   There is no aortic valve stenosis.   Anticipated discharge date to be determined.   Recommend dual antiplatelet therapy with Aspirin  81mg  daily and Clopidogrel  75mg  daily. Conclusions: Relatively stable appearance of severe multivessel CAD,  as detailed below, compared to last catheterization in 09/2021. Normal left and right heart filling pressures. Normal Fick cardiac output/index. Recommendations: Continue escalation of goal-directed medical therapy as tolerated. Maintain net even fluid balance.  Will hold further diuretics today; consider transitioning to oral regimen as soon as tomorrow. Will review angiograms with Heart Team tomorrow.  Continue aggressive secondary prevention in the meantime. Lonni Hanson, MD [facility]  ECHOCARDIOGRAM COMPLETE Result Date: 06/28/2024    ECHOCARDIOGRAM REPORT   Patient Name:   Brooke [name] Date of Exam: 06/28/2024 Medical Rec #:  [id number]      Height:       64.0 in Accession #:    [id number]     Weight:       110.0 lb Date of Birth:  09-10-1960      BSA:          1.517 m Patient Age:    63 years       BP:           118/70 mmHg Patient Gender: F              HR:           78 bpm. Exam Location:  Inpatient Procedure: 2D Echo, Cardiac Doppler, Color Doppler and Intracardiac            Opacification Agent (Both Spectral and Color Flow Doppler were            utilized during procedure). Indications:    CHF I50.31  History:        Patient has prior history of Echocardiogram examinations, most                 recent 10/09/2021. CHF.  Sonographer:    Tinnie Gosling RDCS Referring Phys: [id number] CAROLE N HALL IMPRESSIONS  1. Left ventricular ejection fraction, by estimation, is 20 to 25%. The left ventricle has severely decreased function. The left ventricle demonstrates global hypokinesis. The left ventricular internal cavity size was mildly dilated. Left ventricular diastolic parameters are consistent with Grade II diastolic dysfunction (pseudonormalization).  2. Right ventricular systolic function is normal. The right ventricular size  is normal. Tricuspid regurgitation signal is inadequate for assessing PA pressure.  3. Left atrial size was mildly dilated.  4. A small pericardial effusion is present. The  pericardial effusion is localized near the right atrium. There is no evidence of cardiac tamponade.  5. The mitral valve is normal in structure. Trivial mitral valve regurgitation. No evidence of mitral stenosis.  6. The aortic valve is normal in structure. Aortic valve regurgitation is trivial. No aortic stenosis is present.  7. The inferior vena cava is dilated in size with <50% respiratory variability, suggesting right atrial pressure of 15 mmHg. FINDINGS  Left Ventricle: Left ventricular ejection fraction, by estimation, is 20 to 25%. The left ventricle has severely decreased function. The left ventricle demonstrates global hypokinesis. The left ventricular internal cavity size was mildly dilated. There is no left ventricular hypertrophy. Left ventricular diastolic parameters are consistent with Grade II diastolic dysfunction (pseudonormalization). Right Ventricle: The right ventricular size is normal. No increase in right ventricular wall thickness. Right ventricular systolic function is normal. Tricuspid regurgitation signal is inadequate for assessing PA pressure. Left Atrium: Left atrial size was mildly dilated. Right Atrium: Right atrial size was normal in size. Pericardium: A small pericardial effusion is present. The pericardial effusion is localized near the right atrium. There is no evidence of cardiac tamponade. Mitral Valve: The mitral valve is normal in structure. Mild mitral annular calcification. Trivial mitral valve regurgitation. No evidence of mitral valve stenosis. Tricuspid Valve: The tricuspid valve is normal in structure. Tricuspid valve regurgitation is trivial. No evidence of tricuspid stenosis. Aortic Valve: The aortic valve is normal in structure. Aortic valve regurgitation is trivial. No aortic stenosis is present. Pulmonic Valve: The pulmonic valve was normal in structure. Pulmonic valve regurgitation is not visualized. No evidence of pulmonic stenosis. Aorta: The aortic root is normal  in size and structure. Venous: The inferior vena cava is dilated in size with less than 50% respiratory variability, suggesting right atrial pressure of 15 mmHg. IAS/Shunts: No atrial level shunt detected by color flow Doppler.  LEFT VENTRICLE PLAX 2D LVIDd:         5.10 cm      Diastology LVIDs:         4.20 cm      LV e' medial:    3.59 cm/s LV PW:         0.90 cm      LV E/e' medial:  26.4 LV IVS:        0.90 cm      LV e' lateral:   4.68 cm/s LVOT diam:     2.00 cm      LV E/e' lateral: 20.3 LV SV:         32 LV SV Index:   21 LVOT Area:     3.14 cm  LV Volumes (MOD) LV vol d, MOD A2C: 90.4 ml LV vol d, MOD A4C: 106.0 ml LV vol s, MOD A2C: 74.7 ml LV vol s, MOD A4C: 69.0 ml LV SV MOD A2C:     15.7 ml LV SV MOD A4C:     106.0 ml LV SV MOD BP:      24.3 ml RIGHT VENTRICLE         IVC TAPSE (M-mode): 1.8 cm  IVC diam: 2.40 cm                          PULMONARY VEINS  Diastolic Velocity: 34.50 cm/s                         S/D Velocity:       0.80                         Systolic Velocity:  26.60 cm/s LEFT ATRIUM             Index        RIGHT ATRIUM           Index LA diam:        3.50 cm 2.31 cm/m   RA Area:     10.50 cm LA Vol (A2C):   21.2 ml 13.97 ml/m  RA Volume:   23.20 ml  15.29 ml/m LA Vol (A4C):   47.1 ml 31.04 ml/m LA Biplane Vol: 32.3 ml 21.29 ml/m  AORTIC VALVE LVOT Vmax:   59.90 cm/s LVOT Vmean:  40.900 cm/s LVOT VTI:    0.103 m  AORTA Ao Root diam: 2.80 cm Ao Asc diam:  2.40 cm MITRAL VALVE MV Area (PHT): 4.65 cm    SHUNTS MV Decel Time: 163 msec    Systemic VTI:  0.10 m MV E velocity: 94.90 cm/s  Systemic Diam: 2.00 cm MV A velocity: 77.60 cm/s MV E/A ratio:  1.22 Toribio Fuel MD Electronically signed by Toribio Fuel MD Signature Date/Time: 06/28/2024/2:33:22 PM    Final      Vernal Alstrom, MD  [facility] 06/30/2024  If 7PM-7AM, please contact night-coverage

## 2024-06-30 NOTE — H&P (View-Only) (Signed)
 Cardiology Consultation   Patient ID: Brooke [name] MRN: [id number]; DOB: 13-Aug-1960  Admit date: 06/27/2024 Date of Consult: 06/30/2024  PCP:  [name], MD   [facility] Providers Cardiologist:  Oneil Parchment, MD        Patient Profile:   Brooke [name] is a 63 y.o. female with a hx of hypertension, hypercholesterolemia, type 2 diabetes mellitus, peripheral arterial disease and severe multivessel coronary artery disease by cardiac catheterization in March 2023 where her EF was 20 to 25% and due to diffuse coronary disease, patient's body habitus and frail nature, was recommended medical therapy and turndown for CABG. PAD s/p angioplasty of left popliteal artery and posterior tibial artery (12/24), uncontrolled DM c/b DKA and recent admission for pyelonephritis c/b nephrostomy tube presenting with CHF and CP.   Post GDMT, repeat echocardiogram in September 23 revealed normalization of LVEF and she had been doing relatively well. Recently has had worsening symptoms of dyspnea and chest pain admitted for heart failure and unstable angina, echocardiogram revealing marked decrease in LVEF with global hypokinesis.  She was aggressively diuresed, underwent right and left heart catheterization yesterday revealing again well compensated heart failure with severe multivessel disease including critical left main disease.  Her clinical status and angiographic findings were discussed in detail this morning on Heart Team meeting and it was decided that proceeding with PCI to unprotected left main ostium would be the best option.  I am consulting the patient to discuss options of revascularization, medical therapy as requested by Dr. Ren Ny, Joelle.  Patient states that she feels well this morning, states that her dyspnea has improved.  Unfortunately her husband is also admitted to the hospital for severe aortic stenosis with markedly reduced LVEF.  Her interview and discussions  regarding PCI was done face-to-face and daughter was present over the telephone to discuss and answer any questions.   Past Medical History:  Diagnosis Date   Abdominal pain, left lower quadrant 12/17/2015   Anemia    Anxiety 07/01/2016   Carotid artery disease 10/27/2022   Chest pain, unspecified 10/05/2003   CHF (congestive heart failure) (HCC)    Chronic systolic heart failure (HCC) 02/20/2022   Depression, major, recurrent, moderate (HCC) 07/31/2005   Diabetes mellitus without complication (HCC) 09/08/2006   Endometriosis 03/28/1996   GERD (gastroesophageal reflux disease)    Heart murmur    History of colonic polyps 08/07/2015   20 mm removed 05/10/15 by Dr. Kristie     PAD (peripheral artery disease)    Unspecified constipation 06/24/2004   Inpatient Medications: Scheduled Meds:  aspirin   81 mg Oral QHS   atorvastatin   80 mg Oral Daily   clopidogrel   75 mg Oral Daily   enoxaparin  (LOVENOX ) injection  40 mg Subcutaneous Q24H   free water   250 mL Oral Once   furosemide   40 mg Oral Daily   insulin  aspart  0-6 Units Subcutaneous TID WC   insulin  glargine-yfgn  5 Units Subcutaneous BID   metoprolol  succinate  25 mg Oral Daily   potassium chloride   40 mEq Oral BID   sacubitril -valsartan   1 tablet Oral BID   sodium chloride  flush  3 mL Intravenous Q12H   spironolactone   25 mg Oral Daily   Continuous Infusions:  sodium chloride      PRN Meds: sodium chloride , acetaminophen , melatonin, polyethylene glycol, prochlorperazine , sodium chloride  flush  Allergies:    Allergies  Allergen Reactions   Bayer Aspirin  [Aspirin ] Other (See Comments)    Stomach pain  occurs with 325mg  dose, able to tolerate 81mg .   Darvon-N [Propoxyphene] Rash and Other (See Comments)    Darvocet    Ery-Tab [Erythromycin] Nausea And Vomiting   Glucophage  [Metformin ] Diarrhea, Nausea Only and Other (See Comments)    General GI intolerance    Motrin [Ibuprofen] Other (See Comments)    Stomach pain    Neurontin [Gabapentin] Other (See Comments)    Suicidal ideations    Social History:   Social History   Tobacco Use   Smoking status: Never   Smokeless tobacco: Never  Substance Use Topics   Alcohol use: No  Marital status: Married    Family History:    Family History  Problem Relation Age of Onset   Diabetes Mother    Hypertension Mother    Arthritis Mother        RA   Pancreatic cancer Father    Stomach cancer Neg Hx    Colon cancer Neg Hx    Esophageal cancer Neg Hx    Rectal cancer Neg Hx      ROS:  Review of Systems  Cardiovascular:  Positive for dyspnea on exertion. Negative for chest pain and leg swelling.    Physical Exam    Vitals:   06/30/24 0300 06/30/24 0348 06/30/24 0757 06/30/24 0804  BP: 120/87  (!) 101/57   Pulse: 83 80 81   Resp: 17  18   Temp: 98.9 F (37.2 C)  99.1 F (37.3 C)   TempSrc: Oral  Oral   SpO2: 100% 100% 100% 100%  Weight:  50.6 kg    Height:       Physical Exam Constitutional:      Appearance: She is underweight.  Neck:     Vascular: No carotid bruit or JVD.  Cardiovascular:     Rate and Rhythm: Normal rate and regular rhythm.     Pulses:          Dorsalis pedis pulses are 1+ on the right side and 1+ on the left side.       Posterior tibial pulses are 1+ on the right side and 1+ on the left side.     Heart sounds: Normal heart sounds. No murmur heard.    No gallop.  Pulmonary:     Effort: Pulmonary effort is normal.     Breath sounds: Normal breath sounds.  Abdominal:     General: Bowel sounds are normal.     Palpations: Abdomen is soft.  Musculoskeletal:     Right lower leg: No edema.     Left lower leg: No edema.      Intake/Output Summary (Last 24 hours) at 06/30/2024 1205 Last data filed at 06/30/2024 0800 Gross per 24 hour  Intake 727 ml  Output 2875 ml  Net -2148 ml      06/30/2024    3:48 AM 06/29/2024    5:00 AM 06/27/2024    1:28 PM  Last 3 Weights  Weight (lbs) 111 lb 8.8 oz 110 lb 1.6 oz  110 lb  Weight (kg) 50.6 kg 49.941 kg 49.896 kg     Net IO Since Admission: -5,598 mL [06/30/24 1205]  Labs   Lab Results  Component Value Date   NA 135 06/30/2024   K 3.2 (L) 06/30/2024   CO2 28 06/30/2024   GLUCOSE 176 (H) 06/30/2024   BUN 10 06/30/2024   CREATININE 0.65 06/30/2024   CALCIUM  7.9 (L) 06/30/2024   EGFR 89 06/26/2024   GFRNONAA >60 06/30/2024  Latest Ref Rng & Units 06/30/2024    7:10 AM 06/29/2024    2:56 PM 06/29/2024    2:53 PM  BMP  Glucose 70 - 99 mg/dL 823     BUN 8 - 23 mg/dL 10     Creatinine 9.55 - 1.00 mg/dL 9.34     Sodium 864 - 854 mmol/L 135  143  145   Potassium 3.5 - 5.1 mmol/L 3.2  2.9  2.9   Chloride 98 - 111 mmol/L 98     CO2 22 - 32 mmol/L 28     Calcium  8.9 - 10.3 mg/dL 7.9          Latest Ref Rng & Units 06/29/2024    2:56 PM 06/29/2024    2:53 PM 06/29/2024    2:47 AM  CBC  WBC 4.0 - 10.5 K/uL   6.6   Hemoglobin 12.0 - 15.0 g/dL 88.7  88.7  89.7   Hematocrit 36.0 - 46.0 % 33.0  33.0  32.6   Platelets 150 - 400 K/uL   339     Lab Results  Component Value Date   CHOL 122 06/29/2024   HDL 33 (L) 06/29/2024   LDLCALC 75 06/29/2024   TRIG 72 06/29/2024   CHOLHDL 3.7 06/29/2024    Lab Results  Component Value Date   TSH 2.060 10/09/2021    Lab Results  Component Value Date   HGBA1C 10.7 (H) 04/20/2024   High Sensitivity Troponin:   Recent Labs  Lab 06/27/24 1336 06/27/24 1558  TROPONINIHS 10 11    Cardiac Panel (last 3 results) Recent Labs    06/27/24 1336 06/27/24 1558  TROPONINIHS 10 11   BNP (last 3 results) Recent Labs    12/26/23 1624 05/11/24 2100 06/27/24 1336  BNP 119.1* 2,255.1* 1,890.2*   ProBNP (last 3 results) Recent Labs    05/13/24 0308 06/26/24 0903  PROBNP 7,997.0* 88,126*    Dimer No results for input(s): DDIMER in the last 168 hours.  Tele/EKG/Cardiac studies    EKG:  EKG 06/27/2024: Normal sinus rhythm at rate of 97 bpm, normal axis, poor R progression, cannot  exclude anteroseptal infarct old.  High lateral ST depression with T wave inversion, lateral ischemia.  ECHOCARDIOGRAM COMPLETE 06/28/2024  1. Left ventricular ejection fraction, by estimation, is 20 to 25%. The left ventricle has severely decreased function. The left ventricle demonstrates global hypokinesis. The left ventricular internal cavity size was mildly dilated. Left ventricular diastolic parameters are consistent with Grade II diastolic dysfunction (pseudonormalization). 2. Right ventricular systolic function is normal. The right ventricular size is normal. Tricuspid regurgitation signal is inadequate for assessing PA pressure. 3. Left atrial size was mildly dilated. 4. A small pericardial effusion is present. The pericardial effusion is localized near the right atrium. There is no evidence of cardiac tamponade.  Cardiac catheterization 06/29/2024:   Relatively stable appearance of severe multivessel CAD, as detailed below, compared to last catheterization in 09/2021. Normal left and right heart filling pressures. Normal Fick cardiac output/index.  Radiology  Imaging results have been reviewed  Assessment & Plan .     1.  Unstable angina pectoris with negative cardiac markers 2.  Acute systolic heart failure 3.  Critical left main and multivessel coronary artery disease and occluded RCA. 4.  Diabetes mellitus type 2 uncontrolled with hyperglycemia.  Recommendation: I had an extensive discussion with the patient and her daughter who was on the telephone during the entire discussion and physical exam and evaluation, unfortunately  patient's husband is also admitted to the hospital with acute decompensated heart failure with severe AS and nonischemic cardiomyopathy with reduced EF.  Patient was given the option of continued medical therapy as she had done well in the past.  We also discussed regarding heart team recommendation to proceed with ostial left main unprotected  stenting.  Patient states that she has had extensive discussion with Dr. Ren Ny, Joelle yesterday and also this morning and that she has decided to proceed with PCI.  I discussed with the patient that it is high risk with a mortality of at least 2 to 3% and that in case there is complication and no flow into the left main, her death would be pretty certain.  At this point we would not be performing CPR.  Patient completely understands this and states that she is in the best form right now to proceed as she has decided that something needs to be done as she feels her quality of life has become very poor recently with marked dyspnea on doing given activities of daily living.  We will proceed with high risk unprotected left main PCI today.  Patient is aware of 2 to 3% risk of death.  She is also aware of <1 to 2% risk of bleeding, need for blood transfusion, renal failure, infection but not limited to these.   Gordy Bergamo, MD, [facility] 06/30/2024, 12:05 PM [facility] [address] [place], [place] [address] Phone: [phone]. Fax:  [phone]

## 2024-06-30 NOTE — Plan of Care (Signed)

## 2024-06-30 NOTE — Interval H&P Note (Signed)
 History and Physical Interval Note:  06/30/2024 1:43 PM  Brooke [name]  has presented today for surgery, with the diagnosis of cad.  The various methods of treatment have been discussed with the patient and family. After consideration of risks, benefits and other options for treatment, the patient has consented to  Procedure(s): CORONARY STENT INTERVENTION (N/A) as a surgical intervention.  In view of her very small size, small arteries, will proceed with IVUS, followed by planning for PCI either with intra-aortic balloon pump or if necessary will use circulatory assist device with Impella but will try to avoid this in view of small arteries.  The patient's history has been reviewed, patient examined, no change in status, stable for surgery.  I have reviewed the patient's chart and labs.  Questions were answered to the patient's satisfaction.   High risk features have been discussed extensively with the patient and her daughter.   Gordy Bergamo

## 2024-06-30 NOTE — Heart Team MDD (Signed)
   Heart Team Multi-Disciplinary Discussion  Patient: Brooke [name]  DOB: [birth year]  MRN: [id number]   Date: 06/30/2024  07:15 AM    Attendees: Interventional Cardiology: Lurena Red, MD Lonni Hanson, MD Cara Lovelace, MD Alm Clay, MD Lonni Cash, MD Gordy Bergamo, MD Newman Lawrence, MD Deatrice Cage, MD  Cardiothoracic Surgery: Dorise Lucas ROLLA Linnie Shyrl, MD   Additional Attendees: Con Clunes, MD Joelle Ren Ny, MD    Patient History: 63 y.o. female with medical history significant for type 2 diabetes, hypertension, hyperlipidemia, coronary artery disease, peripheral artery disease, chronic HFpEF chronic indwelling Foley catheter use, history of dysphagia status post esophagus dilatation    Risk Factors: Diabetes Mellitus Hypertension Hyperlipidemia Peripheral Artery Disease   CAD History: Known obstructive coronary artery disease (CAD) with multivessel involvement from March 2023. At that time, she was deemed not a candidate for CABG or PCI by CT Surgery and Interventional Cardiology. Her EF was 20-25% but improved to 55-60% by September of 2023 with medical therapy.   Review of Prior Angiography and PCI Procedures: The team reviewed her coronary angiogram from 06/29/24 in detail.   Discussion: The Heart Team concluded that surgical revascularization remains high risk and not feasible due to poor distal targets, low BMI, PAD, and overall frailty. A high risk PCI could be offered for ostial left main stenting along with continued optimal medical therapy.    Recommendations: PCI     Shona Palma, RN  06/30/2024 1:17 PM

## 2024-06-30 NOTE — Progress Notes (Signed)
 Heart Failure Stewardship Pharmacist Progress Note   PCP: [name], MD PCP-Cardiologist: Oneil Parchment, MD    HPI:  63 yo F with PMH of CHF, T2DM, HTN, HLD, CAD, PAD, chronic foley, and history of dysphagia s/p esophagus dilation.   She initially established with [facility] with the advanced HF team in 2023 during a hospitalization and was treated for cardiogenic shock. Bilateral pleural effusions and multivessel CAD were noted on CT of her chest, she was started on milrinone  due to diuresed with Lasix .  An echo at that time revealed severe biventricular dysfunction with a EF of 20 to 25%.  She underwent right left heart cath revealing severe multivessel CAD, not felt to be a CABG candidate secondary to poor targets, malnutrition and low LV systolic function.  She underwent a cardiac MRI revealed an EF of 24%.   She was started on GDMT.  Repeat echo in 04/2022 showed EF 55-60%, no regional wall motion abnormalities, grade 1 DD, normal RV systolic function, no significant valvular abnormalities. She graduated from [facility] clinic and has been following up with general cardiology since.   Admitted 05/2024 with pyelonephritis and DKA.  Was seen by cardiology outpatient on 11/17. She was not taking lasix  as it makes her feel bad. Noted to have shortness of breath, LE edema, and reported orthopnea. She was 6 lbs above dry weight.   Presented to the ED on 11/18 with persistent chest pain, shortness of breath, LE edema, dry cough, orthopnea/PND. CXR with bilateral pleural effusions with associated atelectasis. BNP elevated. ECHO 11/19 with LVEF 20-25%, global hypokinesis, G2DD, RV normal. R/LHC later on 11/20 showed relatively stable appearance of severe multivessel disease, normal filling pressures and normal output. RA 5, PA 23, wedge 12, CO/CI 5/3.3.   Met with patient and her daughter at bedside. Today she is feeling good. Was notified that her case was discussed from the heart team and plan for  PCI to LM. Remains on supplemental oxygen at 1L (does not require at home). Swelling is improving, trace edema on exam, mostly on the back of her legs. BP is still on the softer side but a bit better than yesterday. Has not been up out of bed so she currently denies lightheadedness or dizziness. BP in the room was 80/50s but on instant recheck, it was back up to 100/60s.   Current HF Medications: Diuretic: furosemide  40 mg PO daily Beta Blocker: metoprolol  XL 25 mg daily ACE/ARB/ARNI: Entresto  24/26 mg BID MRA: spironolactone  25 mg daily  Prior to admission HF Medications: Diuretic: furosemide  20 mg daily Beta blocker: metoprolol  XL 12.5 mg daily ACE/ARB/ARNI: Entresto  49/51 mg BID MRA: spironolactone  25 mg BID  Pertinent Lab Values: Serum creatinine 0.65, BUN 10, Potassium 3.2, Sodium 135, BNP 1890.2, Magnesium  1.7, A1c 10.7 (04/2024)   Vital Signs: Weight: 111 lbs (admission weight: 110 lbs) Blood pressure: 100/60s  Heart rate: 70-80s  I/O: net -2.6L yesterday; net -6.1L since admission  Medication Assistance / Insurance Benefits Check: Does the patient have prescription insurance?  Yes Type of insurance plan: [place] [employer]  Outpatient Pharmacy:  Prior to admission outpatient pharmacy: [facility] Is the patient willing to use [facility] TOC pharmacy at discharge? Yes    Assessment: 1. Acute on chronic systolic CHF (LVEF 20-25%), R/LHC today. NYHA class II symptoms. - Agree with transitioning to furosemide  40 mg PO daily. Strict I/Os and daily weights. Keep K>4 and Mg>2.  - Metoprolol  XL increased to 25 mg daily - monitor BP -  Continue Entresto  24/26 mg BID (was taking Entresto  49/51 mg BID at home)  - Spironolactone  increased to 25 mg daily (was taking spironolactone  25 mg BID at home) - monitor BP - No SGLT2i with chronic foley and DKA history    Plan: 1) Medication changes recommended at this time: - None, continue to monitor BP and symptoms of hypotension when she is  more mobile again  2) Patient assistance: - None pending  3)  Education  - Patient has been educated on current HF medications and potential additions to HF medication regimen - Patient verbalizes understanding that over the next few months, these medication doses may change and more medications may be added to optimize HF regimen - Patient has been educated on basic disease state pathophysiology and goals of therapy   Duwaine Plant, PharmD, BCPS Heart Failure Stewardship Pharmacist Phone [phone]

## 2024-06-30 NOTE — Progress Notes (Signed)
 Mobility Specialist Progress Note:    06/30/24 1222  Mobility  Activity Turned to back - supine (Ankle Pump, Leg Lifts, Heel Slides)  Level of Assistance Standby assist, set-up cues, supervision of patient - no hands on  Assistive Device None  Range of Motion/Exercises Active;Active Assistive  Activity Response Tolerated fair  Mobility Referral Yes  Mobility visit 1 Mobility  Mobility Specialist Start Time (ACUTE ONLY) 1222  Mobility Specialist Stop Time (ACUTE ONLY) 1239  Mobility Specialist Time Calculation (min) (ACUTE ONLY) 17 min   Received pt laying in bed agreeable to session. Pt complaining about pain from foley cath not wanting to walk but at least would try bed mobility. Pt was able to complete two of three exercise with needing assist with leg lifts. Left pt in bed w/ all needs met.   Venetia Keel Mobility Specialist Please Neurosurgeon or Rehab Office at [phone]

## 2024-07-01 ENCOUNTER — Other Ambulatory Visit (HOSPITAL_COMMUNITY): Payer: Self-pay

## 2024-07-01 ENCOUNTER — Encounter (HOSPITAL_COMMUNITY): Payer: Self-pay | Admitting: Cardiology

## 2024-07-01 DIAGNOSIS — R64 Cachexia: Secondary | ICD-10-CM | POA: Diagnosis not present

## 2024-07-01 DIAGNOSIS — E872 Acidosis, unspecified: Secondary | ICD-10-CM | POA: Diagnosis not present

## 2024-07-01 DIAGNOSIS — E7800 Pure hypercholesterolemia, unspecified: Secondary | ICD-10-CM | POA: Diagnosis not present

## 2024-07-01 DIAGNOSIS — Z7902 Long term (current) use of antithrombotics/antiplatelets: Secondary | ICD-10-CM | POA: Diagnosis not present

## 2024-07-01 DIAGNOSIS — E46 Unspecified protein-calorie malnutrition: Secondary | ICD-10-CM | POA: Diagnosis not present

## 2024-07-01 DIAGNOSIS — E1151 Type 2 diabetes mellitus with diabetic peripheral angiopathy without gangrene: Secondary | ICD-10-CM | POA: Diagnosis not present

## 2024-07-01 DIAGNOSIS — E876 Hypokalemia: Secondary | ICD-10-CM | POA: Diagnosis not present

## 2024-07-01 DIAGNOSIS — I5043 Acute on chronic combined systolic (congestive) and diastolic (congestive) heart failure: Secondary | ICD-10-CM | POA: Diagnosis not present

## 2024-07-01 DIAGNOSIS — Z794 Long term (current) use of insulin: Secondary | ICD-10-CM | POA: Diagnosis not present

## 2024-07-01 DIAGNOSIS — E1165 Type 2 diabetes mellitus with hyperglycemia: Secondary | ICD-10-CM | POA: Diagnosis not present

## 2024-07-01 DIAGNOSIS — Z681 Body mass index (BMI) 19 or less, adult: Secondary | ICD-10-CM | POA: Diagnosis not present

## 2024-07-01 DIAGNOSIS — I5023 Acute on chronic systolic (congestive) heart failure: Secondary | ICD-10-CM | POA: Diagnosis not present

## 2024-07-01 LAB — CBC
HCT: 30.7 % — ABNORMAL LOW (ref 36.0–46.0)
Hemoglobin: 9.7 g/dL — ABNORMAL LOW (ref 12.0–15.0)
MCH: 24.6 pg — ABNORMAL LOW (ref 26.0–34.0)
MCHC: 31.6 g/dL (ref 30.0–36.0)
MCV: 77.7 fL — ABNORMAL LOW (ref 80.0–100.0)
Platelets: 322 K/uL (ref 150–400)
RBC: 3.95 MIL/uL (ref 3.87–5.11)
RDW: 15.6 % — ABNORMAL HIGH (ref 11.5–15.5)
WBC: 7.6 K/uL (ref 4.0–10.5)
nRBC: 0 % (ref 0.0–0.2)

## 2024-07-01 LAB — BASIC METABOLIC PANEL WITH GFR
Anion gap: 9 (ref 5–15)
BUN: 7 mg/dL — ABNORMAL LOW (ref 8–23)
CO2: 25 mmol/L (ref 22–32)
Calcium: 8 mg/dL — ABNORMAL LOW (ref 8.9–10.3)
Chloride: 103 mmol/L (ref 98–111)
Creatinine, Ser: 0.72 mg/dL (ref 0.44–1.00)
GFR, Estimated: >60 mL/min (ref >60)
Glucose, Bld: 175 mg/dL — ABNORMAL HIGH (ref 70–99)
Potassium: 4.2 mmol/L (ref 3.5–5.1)
Sodium: 137 mmol/L (ref 135–145)

## 2024-07-01 LAB — GLUCOSE, CAPILLARY
Glucose-Capillary: 118 mg/dL — ABNORMAL HIGH (ref 70–99)
Glucose-Capillary: 164 mg/dL — ABNORMAL HIGH (ref 70–99)
Glucose-Capillary: 233 mg/dL — ABNORMAL HIGH (ref 70–99)

## 2024-07-01 LAB — MAGNESIUM: Magnesium: 1.6 mg/dL — ABNORMAL LOW (ref 1.7–2.4)

## 2024-07-01 MED ORDER — METOPROLOL SUCCINATE ER 25 MG PO TB24
25.0000 mg | ORAL_TABLET | Freq: Every day | ORAL | 0 refills | Status: DC
  Filled 2024-07-01: qty 30, 30d supply, fill #0

## 2024-07-01 MED ORDER — FUROSEMIDE 20 MG PO TABS
20.0000 mg | ORAL_TABLET | Freq: Every day | ORAL | 0 refills | Status: DC
  Filled 2024-07-01: qty 30, 30d supply, fill #0

## 2024-07-01 MED ORDER — SACUBITRIL-VALSARTAN 49-51 MG PO TABS
1.0000 | ORAL_TABLET | Freq: Two times a day (BID) | ORAL | 0 refills | Status: AC
  Filled 2024-07-01: qty 180, 90d supply, fill #0

## 2024-07-01 MED ORDER — MAGNESIUM SULFATE 2 GM/50ML IV SOLN
2.0000 g | Freq: Once | INTRAVENOUS | Status: AC
  Administered 2024-07-01: 2 g via INTRAVENOUS
  Filled 2024-07-01: qty 50

## 2024-07-01 MED ORDER — SPIRONOLACTONE 25 MG PO TABS
25.0000 mg | ORAL_TABLET | Freq: Every day | ORAL | 0 refills | Status: DC
  Filled 2024-07-01: qty 30, 30d supply, fill #0

## 2024-07-01 NOTE — Progress Notes (Signed)
 Discharge Nurse Summary: DC order noted per MD. DC RN at bedside with patient. Patient agreeable with discharge plan, states husband on the same unit. AVS printed/reviewed. PIVs removed, skin intact. No DME needs. No home meds. TOC meds delivered to the patient. CP/Edu resolved. Telemonitor returned to charging station. All belongings accounted for. Radial site CDI w/o s/s bleeding or infection. Patient wheeled down the hall to stay with husband on the floor.  Rosario EMERSON Lund, RN

## 2024-07-01 NOTE — Discharge Summary (Signed)
 Physician Discharge Summary  Brooke [name] FMW:[id number] DOB: [birth year] DOA: 06/27/2024  PCP: [name], MD  Admit date: 06/27/2024 Discharge date: 07/01/2024 30 Day Unplanned Readmission Risk Score    Flowsheet Row ED to Hosp-Admission (Current) from 06/27/2024 in [facility]  30 Day Unplanned Readmission Risk Score (%) 29.08 Filed at 07/01/2024 1200    This score is the patient's risk of an unplanned readmission within 30 days of being discharged (0 -100%). The score is based on dignosis, age, lab data, medications, orders, and past utilization.   Low:  0-14.9   Medium: 15-21.9   High: 22-29.9   Extreme: 30 and above          Admitted From: Home Disposition: Home  Recommendations for Outpatient Follow-up:  Follow up with PCP in 1-2 weeks Please obtain BMP/CBC in one week Follow-up with cardiology as outpatient in 1 to 2 weeks Please follow up with your PCP on the following pending results: Unresulted Labs (From admission, onward)     Start     Ordered   07/05/24 0500  Creatinine, serum  (enoxaparin  (LOVENOX )    CrCl >/= 30 ml/min)  Weekly,   R     Comments: while on enoxaparin  therapy    06/28/24 0137   06/30/24 1511  [facility] Clopidogrel  Test  Once,   R       Question Answer Comment  Preferred Collection Location In Lab   Do you confirm that the patient will be informed of, and will provide consent to, genetic testing, genomic sequencing, and [facility] ongoing storage of their genetic information and sample for future use? Yes   Reason for Testing Diagnosis Pre-Symptomatic   What payment method should be used for this genetic test? Institutional bill      06/30/24 1511              Home Health: Yes Equipment/Devices: None none  Discharge Condition: Stable CODE STATUS: Full code Diet recommendation:  Diet Order             Diet Carb Modified Fluid consistency: Thin; Room service appropriate? Yes  Diet effective  now                   Subjective: Seen and examined.  She has no complaints other than just some weakness.  No chest pain, palpitation, shortness of breath, dizziness or any other complaint.  She is in agreement with going home.  Brief/Interim Summary: Brooke [name] is a 63 y.o. female with medical history significant for type 2 diabetes, hypertension, hyperlipidemia, coronary artery disease, peripheral artery disease, chronic HFpEF chronic indwelling Foley catheter use, history of dysphagia status post esophagus dilatation on 10/22/2022 by [facility], presented hospital with intermittent chest pain for the past 2 days and fluid overload.  Associated with exertional shortness of breath and bilateral lower extremity edema.  States she does not use salt in her diet and has been compliant with her home diuretic.  In the ED vitals were stable.  EKG was unremarkable.  High-sensitivity troponin negative x 2.  Serum glucose over 200.  BNP greater than 1800 and volume overload on exam.  Chest x-ray revealed bilateral pleural effusions, left greater than right.  Received Lasix  40 mg in the ED and was admitted hospital for further evaluation and treatment.  Details below.   Acute on chronic combined systolic and diastolic congestive heart failure, POA Patient presented with  BNP greater than 1800, bilateral lower extremity pitting  edema, bilateral pleural effusions, left greater than right.  Review of previous 2D echocardiogram from 10/15/2021 showed LV ejection fraction of 20 to 25%.  2D echocardiogram done this admission showed LV ejection fraction of 20 to 25% with global hypokinesis and grade 2 diastolic dysfunction.  Cardiology has been consulted and patient underwent cardiac catheterization on 06/29/2024 for ongoing decompensation and chest discomfort and was noted to have multivessel coronary artery disease.  Underwent PCI in the ostial left main on 06/30/2024.  Patient had been seen at the cardiology  clinic 2 days prior to presentation patient is not a candidate for SGLT 2 due to recent history of pyelonephritis and chronic Foley usage.  Cardiology cleared her for discharge with recommendations to continue DAPT with aspirin  and Plavix  for 12 months.   History of coronary artery disease status post cath 2023, mild chest discomfort.  Cardiac catheterization on 06/29/2024 showed severe multivessel disease.  Status post PCI as above.   Hypertension.  Blood pressure controlled.  Continue current medications.   Bilateral pleural effusions secondary to congestive heart failure Continue oral Lasix , incentive spirometry.  Currently on 1 L of oxygen by nasal cannula.   Type 2 diabetes with hyperglycemia Review of last hemoglobin A1c 10.7 on 04/20/2024.  Had hyperglycemia on presentation.  Continue sliding scale insulin  and long-acting insulin  Accu-Cheks diabetic diet.  Being followed by PCP as outpatient.  On insulin  as outpatient.  Farxiga  on hold due to recent DKA UTI/pyelonephritis.  Diabetic coordinator on board and insulin  dose has been adjusted.   Mild non-anion gap metabolic acidosis Improved   Hyperlipidemia Peripheral artery disease Continue Aspirin /Plavix , Lipitor , Toprol -XL   Generalized weakness Seen by PT OT, home health PT OT recommended and is being arranged for her.   Hypokalemia.  Resolved  Discharge plan was discussed with patient and/or family member and they verbalized understanding and agreed with it.  Discharge Diagnoses:  Principal Problem:   Acute on chronic congestive heart failure (HCC) Active Problems:   Acute systolic heart failure [facility])    Discharge Instructions  Discharge Instructions     Amb Referral to Cardiac Rehabilitation   Complete by: As directed    Diagnosis:  Coronary Stents PTCA     After initial evaluation and assessments completed: Virtual Based Care may be provided alone or in conjunction with Phase 2 Cardiac Rehab based on patient  barriers.: Yes   Intensive Cardiac Rehabilitation (ICR) [facility] location only OR Traditional Cardiac Rehabilitation (TCR) *If criteria for ICR are not met will enroll in TCR ([facility] only): Yes   Ambulatory referral to Occupational Therapy   Complete by: As directed    Ambulatory referral to Physical Therapy   Complete by: As directed    Ambulatory referral to Physical Therapy   Complete by: As directed       Allergies as of 07/01/2024       Reactions   Bayer Aspirin  [aspirin ] Other (See Comments)   Stomach pain occurs with 325mg  dose, able to tolerate 81mg .   Darvon-n [propoxyphene] Rash, Other (See Comments)   Darvocet    Ery-tab [erythromycin] Nausea And Vomiting   Glucophage  [metformin ] Diarrhea, Nausea Only, Other (See Comments)   General GI intolerance    Motrin [ibuprofen] Other (See Comments)   Stomach pain   Neurontin [gabapentin] Other (See Comments)   Suicidal ideations         Medication List     STOP taking these medications    Dexcom G7 Receiver Espiridion  TAKE these medications    acetaminophen  500 MG tablet Commonly known as: TYLENOL  Take 1,000 mg by mouth every 6 (six) hours as needed for headache or fever (pain).   aspirin  81 MG chewable tablet Chew 1 tablet (81 mg total) by mouth daily. What changed: when to take this   atorvastatin  80 MG tablet Commonly known as: LIPITOR  Take 1 tablet (80 mg total) by mouth daily. What changed: when to take this   BLOOD GLUCOSE TEST STRIPS Strp 1 each by Does not apply route 3 (three) times daily. Use as directed to check blood sugar. May dispense any manufacturer covered by patient's insurance and fits patient's device.   clopidogrel  75 MG tablet Commonly known as: PLAVIX  Take 1 tablet (75 mg total) by mouth daily.   cyanocobalamin 1000 MCG tablet Commonly known as: VITAMIN B12 Take 3,000 mcg by mouth daily.   Entresto  49-51 MG Generic drug: sacubitril -valsartan  Take 1 tablet by mouth 2 (two) times  daily.   FeroSul 325 (65 Fe) MG tablet Generic drug: ferrous sulfate  Take 325 mg by mouth daily.   furosemide  20 MG tablet Commonly known as: LASIX  Take 1 tablet (20 mg total) by mouth daily.   insulin  glargine 100 UNIT/ML Solostar Pen Commonly known as: LANTUS  Inject 20 Units into the skin daily. What changed:  how much to take when to take this   Lancet Device Misc 1 each by Does not apply route 3 (three) times daily. May dispense any manufacturer covered by patient's insurance.   metoprolol  succinate 25 MG 24 hr tablet Commonly known as: TOPROL -XL Take 1 tablet (25 mg total) by mouth daily. What changed:  how much to take additional instructions   NovoLOG  FlexPen 100 UNIT/ML FlexPen Generic drug: insulin  aspart Inject 3-14 Units into the skin 3 (three) times daily with meals. If eating and Blood Glucose (BG) 80 or higher inject 3 units for meal coverage and add correction dose per scale. If not eating, correction dose only. BG <150= 0 unit; BG 150-200= 1 unit; BG 201-250= 3 unit; BG 251-300= 5 unit; BG 301-350= 7 unit; BG 351-400= 9 unit; BG >400= 11 unit and Call Primary Care.   Pen Needles 31G X 5 MM Misc Use 3 times a day.   potassium chloride  SA 20 MEQ tablet Commonly known as: KLOR-CON  M Take 1 tablet (20 mEq total) by mouth 2 (two) times daily.   spironolactone  25 MG tablet Commonly known as: ALDACTONE  Take 1 tablet (25 mg total) by mouth daily. What changed: when to take this        Follow-up Information     [facility]. Go in 4 day(s).   Specialty: Cardiology Why: Hospital follow up 07/04/24 @ 2:45 pm PLEASE bring a current medication list to appointment  FREE valet parking, Entrance C, off [address] and [facility] entrance Contact information: [address] [place]  [phone]        [facility] at  [facility] Follow up.   Specialty: Rehabilitation Why: they will call you to set up apt times. if you do not hear from them in 3 business days please give them a call . thank you Contact information: [address] [place]  [address] [phone]        [name], MD Follow up in 1 week(s).   Specialty: Family Medicine Contact information: [address] [place] [address] [place] [address]  [phone]                Allergies  Allergen Reactions   Bayer Aspirin  [Aspirin ] Other (See Comments)    Stomach pain occurs with 325mg  dose, able to tolerate 81mg .   Darvon-N [Propoxyphene] Rash and Other (See Comments)    Darvocet    Ery-Tab [Erythromycin] Nausea And Vomiting   Glucophage  [Metformin ] Diarrhea, Nausea Only and Other (See Comments)    General GI intolerance    Motrin [Ibuprofen] Other (See Comments)    Stomach pain   Neurontin [Gabapentin] Other (See Comments)    Suicidal ideations     Consultations: Cardiology   Procedures/Studies: CARDIAC CATHETERIZATION Result Date: 06/30/2024 Images from the original result were not included. Coronary angioplasty 06/30/2024: Successful IVUS guided stenting of the ostial left main with implantation of a 3.5 x 8 mm MEGATRON DES, postdilated with the same stent balloon at 20 atmospheric pressure x 3 including flaring into the ostium and aorta.  Preangioplasty IVUS lumen area 1.6-1.7 mm post stent 17.1 mm.   Recommendation: Patient will probably need lifelong Plavix , for now DAPT with aspirin  81 mg and Plavix  75 mg daily.   CARDIAC CATHETERIZATION Result Date: 06/29/2024   Mid LAD lesion is 80% stenosed.   Prox LAD lesion is 80% stenosed.   Mid Cx lesion is 70% stenosed.   Prox RCA lesion is 100% stenosed.   2nd Diag lesion is 85% stenosed.   1st Diag lesion is 99% stenosed.   Ost LM lesion is 70% stenosed.   LV end diastolic pressure is normal.   There is no aortic valve stenosis.   Anticipated  discharge date to be determined.   Recommend dual antiplatelet therapy with Aspirin  81mg  daily and Clopidogrel  75mg  daily. Conclusions: Relatively stable appearance of severe multivessel CAD, as detailed below, compared to last catheterization in 09/2021. Normal left and right heart filling pressures. Normal Fick cardiac output/index. Recommendations: Continue escalation of goal-directed medical therapy as tolerated. Maintain net even fluid balance.  Will hold further diuretics today; consider transitioning to oral regimen as soon as tomorrow. Will review angiograms with Heart Team tomorrow.  Continue aggressive secondary prevention in the meantime. Lonni Hanson, MD [facility]  ECHOCARDIOGRAM COMPLETE Result Date: 06/28/2024    ECHOCARDIOGRAM REPORT   Patient Name:   Brooke [name] Date of Exam: 06/28/2024 Medical Rec #:  [id number]      Height:       64.0 in Accession #:    [id number]     Weight:       110.0 lb Date of Birth:  1960/10/20      BSA:          1.517 m Patient Age:    63 years       BP:           118/70 mmHg Patient Gender: F              HR:           78 bpm. Exam Location:  Inpatient Procedure: 2D Echo, Cardiac Doppler, Color Doppler and Intracardiac            Opacification Agent (Both Spectral and Color Flow Doppler were            utilized during procedure). Indications:    CHF I50.31  History:        Patient has prior history of Echocardiogram examinations, most  recent 10/09/2021. CHF.  Sonographer:    Tinnie Gosling RDCS Referring Phys: [id number] CAROLE N HALL IMPRESSIONS  1. Left ventricular ejection fraction, by estimation, is 20 to 25%. The left ventricle has severely decreased function. The left ventricle demonstrates global hypokinesis. The left ventricular internal cavity size was mildly dilated. Left ventricular diastolic parameters are consistent with Grade II diastolic dysfunction (pseudonormalization).  2. Right ventricular systolic function is normal. The right  ventricular size is normal. Tricuspid regurgitation signal is inadequate for assessing PA pressure.  3. Left atrial size was mildly dilated.  4. A small pericardial effusion is present. The pericardial effusion is localized near the right atrium. There is no evidence of cardiac tamponade.  5. The mitral valve is normal in structure. Trivial mitral valve regurgitation. No evidence of mitral stenosis.  6. The aortic valve is normal in structure. Aortic valve regurgitation is trivial. No aortic stenosis is present.  7. The inferior vena cava is dilated in size with <50% respiratory variability, suggesting right atrial pressure of 15 mmHg. FINDINGS  Left Ventricle: Left ventricular ejection fraction, by estimation, is 20 to 25%. The left ventricle has severely decreased function. The left ventricle demonstrates global hypokinesis. The left ventricular internal cavity size was mildly dilated. There is no left ventricular hypertrophy. Left ventricular diastolic parameters are consistent with Grade II diastolic dysfunction (pseudonormalization). Right Ventricle: The right ventricular size is normal. No increase in right ventricular wall thickness. Right ventricular systolic function is normal. Tricuspid regurgitation signal is inadequate for assessing PA pressure. Left Atrium: Left atrial size was mildly dilated. Right Atrium: Right atrial size was normal in size. Pericardium: A small pericardial effusion is present. The pericardial effusion is localized near the right atrium. There is no evidence of cardiac tamponade. Mitral Valve: The mitral valve is normal in structure. Mild mitral annular calcification. Trivial mitral valve regurgitation. No evidence of mitral valve stenosis. Tricuspid Valve: The tricuspid valve is normal in structure. Tricuspid valve regurgitation is trivial. No evidence of tricuspid stenosis. Aortic Valve: The aortic valve is normal in structure. Aortic valve regurgitation is trivial. No aortic  stenosis is present. Pulmonic Valve: The pulmonic valve was normal in structure. Pulmonic valve regurgitation is not visualized. No evidence of pulmonic stenosis. Aorta: The aortic root is normal in size and structure. Venous: The inferior vena cava is dilated in size with less than 50% respiratory variability, suggesting right atrial pressure of 15 mmHg. IAS/Shunts: No atrial level shunt detected by color flow Doppler.  LEFT VENTRICLE PLAX 2D LVIDd:         5.10 cm      Diastology LVIDs:         4.20 cm      LV e' medial:    3.59 cm/s LV PW:         0.90 cm      LV E/e' medial:  26.4 LV IVS:        0.90 cm      LV e' lateral:   4.68 cm/s LVOT diam:     2.00 cm      LV E/e' lateral: 20.3 LV SV:         32 LV SV Index:   21 LVOT Area:     3.14 cm  LV Volumes (MOD) LV vol d, MOD A2C: 90.4 ml LV vol d, MOD A4C: 106.0 ml LV vol s, MOD A2C: 74.7 ml LV vol s, MOD A4C: 69.0 ml LV SV MOD A2C:     15.7  ml LV SV MOD A4C:     106.0 ml LV SV MOD BP:      24.3 ml RIGHT VENTRICLE         IVC TAPSE (M-mode): 1.8 cm  IVC diam: 2.40 cm                          PULMONARY VEINS                         Diastolic Velocity: 34.50 cm/s                         S/D Velocity:       0.80                         Systolic Velocity:  26.60 cm/s LEFT ATRIUM             Index        RIGHT ATRIUM           Index LA diam:        3.50 cm 2.31 cm/m   RA Area:     10.50 cm LA Vol (A2C):   21.2 ml 13.97 ml/m  RA Volume:   23.20 ml  15.29 ml/m LA Vol (A4C):   47.1 ml 31.04 ml/m LA Biplane Vol: 32.3 ml 21.29 ml/m  AORTIC VALVE LVOT Vmax:   59.90 cm/s LVOT Vmean:  40.900 cm/s LVOT VTI:    0.103 m  AORTA Ao Root diam: 2.80 cm Ao Asc diam:  2.40 cm MITRAL VALVE MV Area (PHT): 4.65 cm    SHUNTS MV Decel Time: 163 msec    Systemic VTI:  0.10 m MV E velocity: 94.90 cm/s  Systemic Diam: 2.00 cm MV A velocity: 77.60 cm/s MV E/A ratio:  1.22 Toribio Fuel MD Electronically signed by Toribio Fuel MD Signature Date/Time: 06/28/2024/2:33:22 PM    Final     DG Chest 1 View Result Date: 06/27/2024 EXAM: 1 VIEW(S) XRAY OF THE CHEST 06/27/2024 02:05:00 PM COMPARISON: 05/13/2024 CLINICAL HISTORY: Chest pain. FINDINGS: LUNGS AND PLEURA: Bilateral pleural effusions are noted with associated atelectasis, left greater than right. No pneumothorax. HEART AND MEDIASTINUM: No acute abnormality of the cardiac and mediastinal silhouettes. BONES AND SOFT TISSUES: No acute osseous abnormality. IMPRESSION: 1. Bilateral pleural effusions with associated atelectasis, left greater than right. Electronically signed by: Lynwood Seip MD 06/27/2024 02:25 PM EST RP Workstation: [id number]     Discharge Exam: Vitals:   07/01/24 0448 07/01/24 1220  BP: 104/68 (!) 92/58  Pulse: 74 73  Resp: 20 17  Temp: 98.1 F (36.7 C) (!) 97.4 F (36.3 C)  SpO2: 100% 100%   Vitals:   06/30/24 2203 07/01/24 0023 07/01/24 0448 07/01/24 1220  BP: 104/62 100/66 104/68 (!) 92/58  Pulse: 75 77 74 73  Resp: 16 20 20 17   Temp:  97.7 F (36.5 C) 98.1 F (36.7 C) (!) 97.4 F (36.3 C)  TempSrc:  Oral Oral Oral  SpO2: 100% 100% 100% 100%  Weight:   50.4 kg   Height:        General: Pt is alert, awake, not in acute distress Cardiovascular: RRR, S1/S2 +, no rubs, no gallops Respiratory: CTA bilaterally, no wheezing, no rhonchi Abdominal: Soft, NT, ND, bowel sounds + Extremities: no edema, no cyanosis    The results of significant diagnostics from this  hospitalization (including imaging, microbiology, ancillary and laboratory) are listed below for reference.     Microbiology: No results found for this or any previous visit (from the past 240 hours).   Labs: BNP (last 3 results) Recent Labs    12/26/23 1624 05/11/24 2100 06/27/24 1336  BNP 119.1* 2,255.1* 1,890.2*   Basic Metabolic Panel: Recent Labs  Lab 06/27/24 1336 06/28/24 0515 06/29/24 0247 06/29/24 1453 06/29/24 1456 06/30/24 0710 06/30/24 1452 07/01/24 0220  NA 140 140 141 145 143 135 138 137  K 3.8  4.3 3.9 2.9* 2.9* 3.2* 3.3* 4.2  CL 108 103 101  --   --  98  --  103  CO2 21* 24 27  --   --  28  --  25  GLUCOSE 206* 86 75  --   --  176*  --  175*  BUN 7* 7* 7*  --   --  10  --  7*  CREATININE 0.71 0.93 0.96  --   --  0.65  --  0.72  CALCIUM  7.5* 9.0 8.3*  --   --  7.9*  --  8.0*  MG  --  1.7 1.7  --   --  1.7  --  1.6*  PHOS  --  3.2 3.9  --   --   --   --   --    Liver Function Tests: Recent Labs  Lab 06/26/24 0903 06/27/24 1336  AST 10 21  ALT 8 10  ALKPHOS 119 86  BILITOT 0.4 1.0  PROT 7.0 6.3*  ALBUMIN 3.6* 2.6*   No results for input(s): LIPASE, AMYLASE in the last 168 hours. No results for input(s): AMMONIA in the last 168 hours. CBC: Recent Labs  Lab 06/27/24 1336 06/28/24 0515 06/29/24 0247 06/29/24 1453 06/29/24 1456 06/30/24 1452 07/01/24 0220  WBC 7.2 8.1 6.6  --   --   --  7.6  NEUTROABS 5.0  --   --   --   --   --   --   HGB 9.7* 10.5* 10.2* 11.2* 11.2* 9.9* 9.7*  HCT 32.1* 33.8* 32.6* 33.0* 33.0* 29.0* 30.7*  MCV 80.7 79.7* 78.9*  --   --   --  77.7*  PLT 276 338 339  --   --   --  322   Cardiac Enzymes: No results for input(s): CKTOTAL, CKMB, CKMBINDEX, TROPONINI in the last 168 hours. BNP: Invalid input(s): POCBNP CBG: Recent Labs  Lab 06/30/24 1150 06/30/24 1708 06/30/24 2148 07/01/24 0756 07/01/24 1218  GLUCAP 165* 132* 164* 118* 164*   D-Dimer No results for input(s): DDIMER in the last 72 hours. Hgb A1c No results for input(s): HGBA1C in the last 72 hours. Lipid Profile Recent Labs    06/29/24 0247  CHOL 122  HDL 33*  LDLCALC 75  TRIG 72  CHOLHDL 3.7   Thyroid function studies No results for input(s): TSH, T4TOTAL, T3FREE, THYROIDAB in the last 72 hours.  Invalid input(s): FREET3 Anemia work up No results for input(s): VITAMINB12, FOLATE, FERRITIN, TIBC, IRON , RETICCTPCT in the last 72 hours. Urinalysis    Component Value Date/Time   COLORURINE STRAW (A) 06/28/2024 0300    APPEARANCEUR HAZY (A) 06/28/2024 0300   LABSPEC 1.004 (L) 06/28/2024 0300   PHURINE 7.0 06/28/2024 0300   GLUCOSEU NEGATIVE 06/28/2024 0300   HGBUR SMALL (A) 06/28/2024 0300   BILIRUBINUR NEGATIVE 06/28/2024 0300   BILIRUBINUR negative 01/14/2024 1259   BILIRUBINUR Negative 03/17/2021 1107   KETONESUR  NEGATIVE 06/28/2024 0300   PROTEINUR NEGATIVE 06/28/2024 0300   UROBILINOGEN 0.2 01/14/2024 1259   UROBILINOGEN 0.2 11/07/2020 1150   NITRITE NEGATIVE 06/28/2024 0300   LEUKOCYTESUR LARGE (A) 06/28/2024 0300   Sepsis Labs Recent Labs  Lab 06/27/24 1336 06/28/24 0515 06/29/24 0247 07/01/24 0220  WBC 7.2 8.1 6.6 7.6   Microbiology No results found for this or any previous visit (from the past 240 hours).  FURTHER DISCHARGE INSTRUCTIONS:   Get Medicines reviewed and adjusted: Please take all your medications with you for your next visit with your Primary MD   Laboratory/radiological data: Please request your Primary MD to go over all hospital tests and procedure/radiological results at the follow up, please ask your Primary MD to get all Hospital records sent to his/her office.   In some cases, they will be blood work, cultures and biopsy results pending at the time of your discharge. Please request that your primary care M.D. goes through all the records of your hospital data and follows up on these results.   Also Note the following: If you experience worsening of your admission symptoms, develop shortness of breath, life threatening emergency, suicidal or homicidal thoughts you must seek medical attention immediately by calling 911 or calling your MD immediately  if symptoms less severe.   You must read complete instructions/literature along with all the possible adverse reactions/side effects for all the Medicines you take and that have been prescribed to you. Take any new Medicines after you have completely understood and accpet all the possible adverse reactions/side effects.     patient was instructed, not to drive, operate heavy machinery, perform activities at heights, swimming or participation in water  activities or provide baby-sitting services while on Pain, Sleep and Anxiety Medications; until their outpatient Physician has advised to do so again. Also recommended to not to take more than prescribed Pain, Sleep and Anxiety Medications.  It is not advisable to combine anxiety, sleep and pain medications without talking with your primary care provider.     Wear Seat belts while driving.   Please note: You were cared for by a hospitalist during your hospital stay. Once you are discharged, your primary care physician will handle any further medical issues. Please note that NO REFILLS for any discharge medications will be authorized once you are discharged, as it is imperative that you return to your primary care physician (or establish a relationship with a primary care physician if you do not have one) for your post hospital discharge needs so that they can reassess your need for medications and monitor your lab values  Time coordinating discharge: Over 30 minutes  SIGNED:   Fredia Skeeter, MD  [facility] 07/01/2024, 3:00 PM *Please note that this is a verbal dictation therefore any spelling or grammatical errors are due to the Dragon Medical One system interpretation. If 7PM-7AM, please contact night-coverage [email or web address]

## 2024-07-01 NOTE — Progress Notes (Signed)
 RNCM spoke with patient and she declines Home Health services-states she would prefer outpatient services.

## 2024-07-01 NOTE — Progress Notes (Signed)
 CARDIAC REHAB PHASE I   PRE:  Rate/Rhythm: 74 SR    BP: sitting 104/69    SpO2: 100 RA  MODE:  Ambulation: stood x2   POST:  Rate/Rhythm: 80 SR    BP: sitting 96/64     SpO2: 99 RA   Pt very weak. Slowly moved to EOB and stood, no real assist. Once standing, c/o 11/10 left groin pain and sat back down x2. Return to bed. Needs PT/OT. CR will sign off.  Discussed with pt importance of Plavix , watching for fluid retentions, decreased sodium in diet but increasing nutrition. Pt receptive. Will refer to [facility]II to meet protocol however she will need significant increase in strength/endurance before ready.  [id number]  Aliene Aris BS, ACSM-CEP 07/01/2024 12:13 PM

## 2024-07-01 NOTE — Progress Notes (Signed)
 Physical Therapy Treatment Patient Details Name: Brooke [name] MRN: [id number] DOB: [birth year] Today's Date: 07/01/2024   History of Present Illness Brooke [name] is a 63 y.o. female 06/27/24  presented with intermittent chest pain, associated with exertional shortness of breath and bilateral lower extremity edema.   Chest x-ray revealed bilateral pleural effusions, left greater than right.  Suspected CHF exacerbation, cardiac catheterization 11/20. Coronary stent placement 11/21. PMH: type 2 diabetes, hypertension, hyperlipidemia, coronary artery disease, peripheral artery disease, chronic HFpEF chronic indwelling Foley catheter use, history of dysphagia.    PT Comments  Pt demo mod I bed mobility. CGA transfers and amb 15' with RW. Increased time required to complete all mobility skills. Pt reporting L groin pain as limiting factor. BP stable at 114/73 after amb. Pt returned to bed at end of session. Spoke with pt regarding needs for further therapy. She is declining SNF or HH. Pt only agreeable to OPPT.   If plan is discharge home, recommend the following: A little help with walking and/or transfers;A little help with bathing/dressing/bathroom;Assistance with cooking/housework;Assist for transportation;Help with stairs or ramp for entrance   Can travel by private vehicle        Equipment Recommendations  None recommended by PT    Recommendations for Other Services       Precautions / Restrictions Precautions Precautions: Fall;Other (comment) Recall of Precautions/Restrictions: Intact Precaution/Restrictions Comments: watch BP     Mobility  Bed Mobility Overal bed mobility: Modified Independent             General bed mobility comments: using bed functions, increased time    Transfers Overall transfer level: Needs assistance Equipment used: Rolling walker (2 wheels) Transfers: Sit to/from Stand Sit to Stand: Contact guard assist           General transfer  comment: increased time, STS x 4 trials    Ambulation/Gait Ambulation/Gait assistance: Contact guard assist Gait Distance (Feet): 15 Feet Assistive device: Rolling walker (2 wheels) Gait Pattern/deviations: Step-through pattern, Step-to pattern, Decreased stride length, Trunk flexed Gait velocity: very slow and tenuous     General Gait Details: Maintaining very flexed posture. When cued to correct, pt stating this is my only comfortable position. BP 114/73   Stairs             Wheelchair Mobility     Tilt Bed    Modified Rankin (Stroke Patients Only)       Balance Overall balance assessment: Needs assistance Sitting-balance support: No upper extremity supported, Feet supported Sitting balance-Leahy Scale: Good     Standing balance support: Bilateral upper extremity supported, During functional activity, Reliant on assistive device for balance Standing balance-Leahy Scale: Poor                              Communication Communication Communication: No apparent difficulties  Cognition Arousal: Alert Behavior During Therapy: WFL for tasks assessed/performed   PT - Cognitive impairments: No apparent impairments                         Following commands: Intact      Cueing Cueing Techniques: Verbal cues  Exercises      General Comments General comments (skin integrity, edema, etc.): SpO2 in 90s on RA. HR 77. BP 114/73      Pertinent Vitals/Pain Pain Assessment Pain Assessment: Faces Faces Pain Scale: Hurts even more Pain Location: L groin Pain Descriptors /  Indicators: Grimacing, Guarding Pain Intervention(s): Limited activity within patient's tolerance, Monitored during session    Home Living                          Prior Function            PT Goals (current goals can now be found in the care plan section) Acute Rehab PT Goals Patient Stated Goal: home Progress towards PT goals: Not progressing toward  goals - comment (pain, self limiting)    Frequency    Min 2X/week      PT Plan      Co-evaluation              AM-PAC PT 6 Clicks Mobility   Outcome Measure  Help needed turning from your back to your side while in a flat bed without using bedrails?: None Help needed moving from lying on your back to sitting on the side of a flat bed without using bedrails?: A Little Help needed moving to and from a bed to a chair (including a wheelchair)?: A Little Help needed standing up from a chair using your arms (e.g., wheelchair or bedside chair)?: A Little Help needed to walk in hospital room?: A Little Help needed climbing 3-5 steps with a railing? : Total 6 Click Score: 17    End of Session Equipment Utilized During Treatment: Gait belt Activity Tolerance: Patient limited by pain Patient left: in bed;with call bell/phone within reach;with bed alarm set Nurse Communication: Mobility status PT Visit Diagnosis: Muscle weakness (generalized) (M62.81)     Time: 8542-8479 PT Time Calculation (min) (ACUTE ONLY): 23 min  Charges:    $Gait Training: 23-37 mins PT General Charges $$ ACUTE PT VISIT: 1 Visit                     Sari MATSU., PT  Office # [phone]    Erven Sari Shaker 07/01/2024, 3:35 PM

## 2024-07-03 ENCOUNTER — Telehealth (HOSPITAL_COMMUNITY): Payer: Self-pay

## 2024-07-03 ENCOUNTER — Telehealth: Payer: Self-pay | Admitting: *Deleted

## 2024-07-03 MED FILL — Nitroglycerin IV Soln 100 MCG/ML in D5W: INTRA_ARTERIAL | Qty: 10 | Status: AC

## 2024-07-03 NOTE — Telephone Encounter (Signed)
 Attempted to call patient in regards to Cardiac Rehab - unable to leave message.

## 2024-07-03 NOTE — Transitions of Care (Post Inpatient/ED Visit) (Signed)
   07/03/2024  Name: Jenell Dobransky MRN: [id number] DOB: 1961/02/05  Today's TOC FU Call Status: Today's TOC FU Call Status:: Unsuccessful Call (1st Attempt) Unsuccessful Call (1st Attempt) Date: 07/03/24  Attempted to reach the patient regarding the most recent Inpatient/ED visit.  Follow Up Plan: Additional outreach attempts will be made to reach the patient to complete the Transitions of Care (Post Inpatient/ED visit) call.   Andrea Dimes RN, BSN [place]  [facility] RN Care Manager [phone]

## 2024-07-04 ENCOUNTER — Telehealth (HOSPITAL_COMMUNITY): Payer: Self-pay

## 2024-07-04 ENCOUNTER — Ambulatory Visit (HOSPITAL_COMMUNITY): Admit: 2024-07-04 | Discharge: 2024-07-04 | Disposition: A | Discharge status: Home / Self Care

## 2024-07-04 ENCOUNTER — Telehealth: Payer: Self-pay | Admitting: *Deleted

## 2024-07-04 NOTE — Progress Notes (Incomplete)
 HEART & VASCULAR TRANSITION OF CARE CONSULT NOTE     PCP: [name], MD   Chief Complaint: chronic HFpEF  HPI: Referred to clinic by Dr. Vernon for heart failure consultation.   Brooke [name] is a 63 y.o. female chronic HFpEF, DM complicated by DKA, HTN, HLD, diffuse CAD, PAD, chronic indwelling foley use, chronic pelvic floor dysfunction and dysphagia s/p esophageal dilatation 3/24.   Admitted 3/23 with acute CHF and cardiogenic shock. Had not seen a medical provider in > 2 years. She was started on empiric milrinone  and diuresed with IV lasix . Echo with LVEF 20-25%, GIIDD, RV moderately reduced, trivial MR. R/LHC: severe multivessel CAD with severe LV dysfunction, low filling pressures and low output. Milrinone  increased and AHF consulted. Not CABG candidate with poor targets, malnutrition and LV systolic dysfunction. Cardiac MRI showed LVEF 24% with < 50% wall thickness LGE in all areas except apex. Coronary anatomy not favorable for PCI. Milrinone  weaned off and GDMT titrated. Insulin  started with A1C 11.5. Discharged home, weight 100 lbs.   Echo 9/23: EF 55-60%. Graduated from AHF clinic.   Admitted 11/25 with CP and SOB.   Echo 11/25: EF 20-25%, LV with GHK, GIIDD, RV normal, LA mildly dilated, small pericardial effusion present, trivial MR. L/RHC 06/29/24: severe multivessel CAD, normal L/RHC filling pressures and normal Fick CO/CO. 06/30/24 LHC successful DES to ostial L main. Plan for lifelong plavix , discharged with ASA/Plavix .   Today she presents for AHF [facility] clinic visit. Overall feeling ***. Denies palpitations, CP, dizziness, edema, or PND/Orthopnea. *** SOB. Appetite ok. No fever or chills. Weight at home *** pounds. Taking all medications. Denies ETOH, tobacco or drug use.   Past Medical History:  Diagnosis Date   Abdominal pain, left lower quadrant 12/17/2015   Anemia    Anxiety 07/01/2016   Carotid artery disease 10/27/2022   Chest pain, unspecified  10/05/2003   CHF (congestive heart failure) (HCC)    Chronic systolic heart failure (HCC) 02/20/2022   Depression, major, recurrent, moderate (HCC) 07/31/2005   Diabetes mellitus without complication (HCC) 09/08/2006   Endometriosis 03/28/1996   GERD (gastroesophageal reflux disease)    Heart murmur    History of colonic polyps 08/07/2015   20 mm removed 05/10/15 by Dr. Kristie     PAD (peripheral artery disease)    Unspecified constipation 06/24/2004    Current Outpatient Medications  Medication Sig Dispense Refill   acetaminophen  (TYLENOL ) 500 MG tablet Take 1,000 mg by mouth every 6 (six) hours as needed for headache or fever (pain).     aspirin  81 MG chewable tablet Chew 1 tablet (81 mg total) by mouth daily. (Patient taking differently: Chew 81 mg by mouth at bedtime.) 30 tablet 1   atorvastatin  (LIPITOR ) 80 MG tablet Take 1 tablet (80 mg total) by mouth daily. (Patient taking differently: Take 80 mg by mouth at bedtime.) 90 tablet 1   clopidogrel  (PLAVIX ) 75 MG tablet Take 1 tablet (75 mg total) by mouth daily. 90 tablet 1   furosemide  (LASIX ) 20 MG tablet Take 1 tablet (20 mg total) by mouth daily. 30 tablet 0   Glucose Blood (BLOOD GLUCOSE TEST STRIPS) STRP 1 each by Does not apply route 3 (three) times daily. Use as directed to check blood sugar. May dispense any manufacturer covered by patient's insurance and fits patient's device. (Patient not taking: Reported on 06/26/2024) 100 strip 0   insulin  aspart (NOVOLOG ) 100 UNIT/ML FlexPen Inject 3-14 Units into the skin 3 (three)  times daily with meals. If eating and Blood Glucose (BG) 80 or higher inject 3 units for meal coverage and add correction dose per scale. If not eating, correction dose only. BG <150= 0 unit; BG 150-200= 1 unit; BG 201-250= 3 unit; BG 251-300= 5 unit; BG 301-350= 7 unit; BG 351-400= 9 unit; BG >400= 11 unit and Call Primary Care. 15 mL 0   insulin  glargine (LANTUS ) 100 UNIT/ML Solostar Pen Inject 20 Units into the  skin daily. (Patient taking differently: Inject 20-21 Units into the skin at bedtime.) 15 mL 3   Insulin  Pen Needle (PEN NEEDLES) 31G X 5 MM MISC Use 3 times a day. (Patient not taking: Reported on 06/28/2024) 100 each 0   Iron , Ferrous Sulfate , 325 (65 Fe) MG TABS Take 325 mg by mouth daily. 30 tablet 1   Lancet Device MISC 1 each by Does not apply route 3 (three) times daily. May dispense any manufacturer covered by patient's insurance. (Patient not taking: Reported on 06/26/2024) 1 each 0   metoprolol  succinate (TOPROL -XL) 25 MG 24 hr tablet Take 1 tablet (25 mg total) by mouth daily. 30 tablet 0   potassium chloride  SA (KLOR-CON  M) 20 MEQ tablet Take 1 tablet (20 mEq total) by mouth 2 (two) times daily. 60 tablet 0   sacubitril -valsartan  (ENTRESTO ) 49-51 MG Take 1 tablet by mouth 2 (two) times daily. 180 tablet 0   spironolactone  (ALDACTONE ) 25 MG tablet Take 1 tablet (25 mg total) by mouth daily. 30 tablet 0   vitamin B-12 (CYANOCOBALAMIN) 1000 MCG tablet Take 3,000 mcg by mouth daily.     No current facility-administered medications for this visit.    Allergies  Allergen Reactions   Bayer Aspirin  [Aspirin ] Other (See Comments)    Stomach pain occurs with 325mg  dose, able to tolerate 81mg .   Darvon-N [Propoxyphene] Rash and Other (See Comments)    Darvocet    Ery-Tab [Erythromycin] Nausea And Vomiting   Glucophage  [Metformin ] Diarrhea, Nausea Only and Other (See Comments)    General GI intolerance    Motrin [Ibuprofen] Other (See Comments)    Stomach pain   Neurontin [Gabapentin] Other (See Comments)    Suicidal ideations       Social History   Socioeconomic History   Marital status: Married    Spouse name: Beryl   Number of children: 1   Years of education: Not on file   Highest education level: Bachelor's degree (e.g., BA, AB, BS)  Occupational History   Not on file  Tobacco Use   Smoking status: Never   Smokeless tobacco: Never  Vaping Use   Vaping status: Never  Used  Substance and Sexual Activity   Alcohol use: No   Drug use: No   Sexual activity: Not Currently  Other Topics Concern   Not on file  Social History Narrative   Not on file   Social Drivers of Health   Financial Resource Strain: Low Risk  (06/29/2024)   Overall Financial Resource Strain (CARDIA)    Difficulty of Paying Living Expenses: Not hard at all  Food Insecurity: Food Insecurity Present (06/28/2024)   Hunger Vital Sign    Worried About Running Out of Food in the Last Year: Sometimes true    Ran Out of Food in the Last Year: Sometimes true  Transportation Needs: No Transportation Needs (06/28/2024)   PRAPARE - Administrator, Civil Service (Medical): No    Lack of Transportation (Non-Medical): No  Physical Activity: Not on  file  Stress: Not on file  Social Connections: Not on file  Intimate Partner Violence: Not At Risk (06/28/2024)   Humiliation, Afraid, Rape, and Kick questionnaire    Fear of Current or Ex-Partner: No    Emotionally Abused: No    Physically Abused: No    Sexually Abused: No      Family History  Problem Relation Age of Onset   Diabetes Mother    Hypertension Mother    Arthritis Mother        RA   Pancreatic cancer Father    Stomach cancer Neg Hx    Colon cancer Neg Hx    Esophageal cancer Neg Hx    Rectal cancer Neg Hx     There were no vitals filed for this visit.  PHYSICAL EXAM: General:  *** appearing.  No respiratory difficulty Neck: JVD *** cm.  Cor: Regular rate & rhythm. No murmurs. Lungs: clear Extremities: no edema  Neuro: alert & oriented x 3. Affect pleasant.   ECG:   ASSESSMENT & PLAN: CAD - LHC as above in HPI. Diffuse multivessel CAD - Continue ASA/Plavix /Statin/BB - No chest pain today   Chronic BiV HF - Echo (3/23): EF 20-25%, RV moderately reduced. - LHC (3/23): w/ severe diffuse MVCAD. - RHC (3/23): on milrinone  0.25 w/ low filling pressures and low CO/CI by thermo, CO 3L/min, CI 2L/m. PAPI  1.8. Milrinone  increased to 0.5.  - cMRI (3/23): EF 24%, RVEF 48%, coronary disease type LGE present, LGE < 50% wall thickness in all areas except apex.  - Echo 9/23: EF 55-60% -iCM - echo 11/25 EF 20-25% - NYHA *** - Volume ***. Continue lasix  20 mg daily - Continue Entresto  49-51 mg BID - Continue spiro 25 mg daily - Continue Toprol  XL 25 mg daily - No SGLT2i with hx DKA and chronic foley use  DM w/ hx DKA - per primary - no SGLT2i with hx DKA - A1c 10.7 9/25    Referred to HFSW (PCP, Medications, Transportation, ETOH Abuse, Drug Abuse, Insurance, Financial ): Yes or No Refer to Pharmacy: Yes or No Refer to Home Health: Yes on No Refer to Advanced Heart Failure Clinic: Yes or no  Refer to General Cardiology: Yes or No  Follow up

## 2024-07-04 NOTE — Telephone Encounter (Signed)
 Called to confirm/remind patient of their appointment at the Advanced Heart Failure Clinic on 07/04/24.   Appointment:   [] Confirmed  [] Left mess   [] No answer/No voice mail  [] VM Full/unable to leave message  [x] Phone not in service  Patient reminded to bring all medications and/or complete list.  Confirmed patient has transportation. Gave directions, instructed to utilize valet parking.

## 2024-07-04 NOTE — Transitions of Care (Post Inpatient/ED Visit) (Signed)
   07/04/2024  Name: Eliyanna Ault MRN: [id number] DOB: 12/25/1960  Today's TOC FU Call Status: Today's TOC FU Call Status:: Unsuccessful Call (2nd Attempt) Unsuccessful Call (2nd Attempt) Date: 07/04/24  Attempted to reach the patient regarding the most recent Inpatient/ED visit.  Follow Up Plan: Additional outreach attempts will be made to reach the patient to complete the Transitions of Care (Post Inpatient/ED visit) call.   Andrea Dimes RN, BSN [place]  [facility] RN Care Manager [phone]

## 2024-07-05 ENCOUNTER — Telehealth: Payer: Self-pay

## 2024-07-05 NOTE — Transitions of Care (Post Inpatient/ED Visit) (Signed)
   07/05/2024  Name: Brooke [name] MRN: [id number] DOB: 1960/12/11  Today's TOC FU Call Status: Today's TOC FU Call Status:: Unsuccessful Call (3rd Attempt) Unsuccessful Call (3rd Attempt) Date: 07/05/24  Attempted to reach the patient regarding the most recent Inpatient/ED visit.  Follow Up Plan: No further outreach attempts will be made at this time. We have been unable to contact the patient.  Ingeborg Fite J. Rodger Giangregorio RN, MSN [facility], [facility] Health RN Care Manager Direct Dial: [phone]  Fax: [phone] Website: [email or web address]

## 2024-07-11 ENCOUNTER — Ambulatory Visit: Payer: Self-pay | Admitting: Cardiology

## 2024-07-11 LAB — HELIX PHARMACOGENOMICS (PGX) CLOPIDOGREL TEST

## 2024-07-11 NOTE — Progress Notes (Signed)
 CYP2C19 clopidogrel  test 12-25: Normal Metabolizer

## 2024-07-11 NOTE — Telephone Encounter (Signed)
Closing note. See other phone note.

## 2024-07-14 ENCOUNTER — Ambulatory Visit: Admitting: Urology

## 2024-07-14 NOTE — Progress Notes (Deleted)
 Assessment: 1. Hypotonic neurogenic bladder   2. Hydronephrosis, unspecified hydronephrosis type   3. History of UTI      Plan: I personally reviewed the patient's chart including provider notes, lab and imaging results.   Chief Complaint: No chief complaint on file.   History of Present Illness:  Brooke [name] is a 63 y.o. female who is seen in consultation from Newlin, Enobong, MD for evaluation of neurogenic/hypotonic bladder, right hydroureteronephrosis, and history of UTIs. She has previously been followed by Dr. Selma with [facility] in [place]. In July 2025, she was found to have a distended bladder with right hydronephrosis to the level of the UVJ. Evaluation with cystoscopy and retrograde pyelogram showed severe right hydroureteronephrosis with a torturous ureter.  She underwent placement of a right ureteral stent with associated purulent debris drained. She was evaluated with urodynamics in August 2025.  This showed a max capacity of 351 mL with instability; she was able to generate a voluntary contraction and void 134 mL with max flow of 5 mL/s and PVR of 216 mL.  Reflux was noted on the right. She underwent right ureteroscopy in September 2022.  No stricture or evidence of obstruction was seen.  Biopsy of the distal ureter was positive for yeast.  She had a stent placed with Foley catheter.  She was seen in the emergency room on 05/12/2024 due to decreased output through the Foley catheter and severe abdominal pain.  CT imaging at that time showed the right ureteral stent in place with persistent distal hydroureteronephrosis and a distended bladder.  Foley catheter was replaced with >1 L of purulent output. She has had a persistent yeast infection.  Urine culture results: 6/25 60K Klebsiella 7/25 >100K Strep, 60K staph 7/25 3K Klebsiella 10/25 >100 K yeast.  Past Medical History:  Past Medical History:  Diagnosis Date   Abdominal pain, left lower quadrant 12/17/2015    Anemia    Anxiety 07/01/2016   Carotid artery disease 10/27/2022   Chest pain, unspecified 10/05/2003   CHF (congestive heart failure) (HCC)    Chronic systolic heart failure (HCC) 02/20/2022   Depression, major, recurrent, moderate (HCC) 07/31/2005   Diabetes mellitus without complication (HCC) 09/08/2006   Endometriosis 03/28/1996   GERD (gastroesophageal reflux disease)    Heart murmur    History of colonic polyps 08/07/2015   20 mm removed 05/10/15 by Dr. Kristie     PAD (peripheral artery disease)    Unspecified constipation 06/24/2004    Past Surgical History:  Past Surgical History:  Procedure Laterality Date   ABDOMINAL AORTOGRAM W/LOWER EXTREMITY Left 07/29/2023   Procedure: ABDOMINAL AORTOGRAM W/LOWER EXTREMITY;  Surgeon: Gretta Lonni PARAS, MD;  Location: [facility] INVASIVE CV LAB;  Service: Cardiovascular;  Laterality: Left;   APPENDECTOMY  08/10/1994   Dr. Buford   BREAST SURGERY Bilateral 08/10/1994   Dr. Ardeen   calcifications removed   CORONARY STENT INTERVENTION N/A 06/30/2024   Procedure: CORONARY STENT INTERVENTION;  Surgeon: Ladona Heinz, MD;  Location: [facility] INVASIVE CV LAB;  Service: Cardiovascular;  Laterality: N/A;   CORONARY ULTRASOUND/IVUS N/A 06/30/2024   Procedure: Coronary Ultrasound/IVUS;  Surgeon: Ladona Heinz, MD;  Location: [facility] INVASIVE CV LAB;  Service: Cardiovascular;  Laterality: N/A;  LM   CYSTOSCOPY W/ RETROGRADES Right 05/01/2024   Procedure: CYSTOSCOPY, WITH RETROGRADE PYELOGRAM;  Surgeon: Selma Donnice SAUNDERS, MD;  Location: [facility] ORS;  Service: Urology;  Laterality: Right;   CYSTOSCOPY W/ URETERAL STENT PLACEMENT Right 03/02/2024   Procedure: CYSTOSCOPY, WITH RETROGRADE PYELOGRAM AND  URETERAL STENT INSERTION;  Surgeon: Selma Donnice SAUNDERS, MD;  Location: [facility] OR;  Service: Urology;  Laterality: Right;   CYSTOSCOPY WITH BIOPSY N/A 05/01/2024   Procedure: CYSTOSCOPY, WITH URETERAL BIOPSY;  Surgeon: Selma Donnice SAUNDERS, MD;  Location: [facility] ORS;  Service: Urology;  Laterality: N/A;    CYSTOSCOPY/URETEROSCOPY/HOLMIUM LASER/STENT PLACEMENT Right 05/01/2024   Procedure: CYSTOSCOPY/URETEROSCOPY/EXCHANGE;  Surgeon: Selma Donnice SAUNDERS, MD;  Location: [facility];  Service: Urology;  Laterality: Right;   ESOPHAGEAL MANOMETRY N/A 03/23/2023   Procedure: ESOPHAGEAL MANOMETRY (EM);  Surgeon: Leigh Elspeth SQUIBB, MD;  Location: [facility];  Service: Gastroenterology;  Laterality: N/A;   INGUINAL HERNIA REPAIR     OVARIAN CYST REMOVAL     PERIPHERAL VASCULAR BALLOON ANGIOPLASTY Left 07/29/2023   Procedure: PERIPHERAL VASCULAR BALLOON ANGIOPLASTY;  Surgeon: Gretta Lonni PARAS, MD;  Location: [facility] INVASIVE CV LAB;  Service: Cardiovascular;  Laterality: Left;   RIGHT/LEFT HEART CATH AND CORONARY ANGIOGRAPHY N/A 10/09/2021   Procedure: RIGHT/LEFT HEART CATH AND CORONARY ANGIOGRAPHY;  Surgeon: Wendel Lurena POUR, MD;  Location: [facility] INVASIVE CV LAB;  Service: Cardiovascular;  Laterality: N/A;   RIGHT/LEFT HEART CATH AND CORONARY ANGIOGRAPHY N/A 06/29/2024   Procedure: RIGHT/LEFT HEART CATH AND CORONARY ANGIOGRAPHY;  Surgeon: Mady Lonni, MD;  Location: [facility] INVASIVE CV LAB;  Service: Cardiovascular;  Laterality: N/A;   TOOTH EXTRACTION N/A 07/28/2022   Procedure: EXTRACTION ALL REMAING TEETH THREE, FOUR, FIVE, SIX, SEVEN, EIGHT, NINE, TEN, ELEVEN, TWELVE, TWENTY ONE, TWENTY TWO, TWENTY THREE, TWENTY FOUR, TWENTY FIVE, TWENTY SIX, TWENTY SEVEN, TWENTY EIGHT, TWENTY NINE, THIRTY, AVEOLOPLASTY;  Surgeon: Sheryle Hamilton, DMD;  Location: [facility] OR;  Service: Oral Surgery;  Laterality: N/A;    Allergies:  Allergies  Allergen Reactions   Bayer Aspirin  [Aspirin ] Other (See Comments)    Stomach pain occurs with 325mg  dose, able to tolerate 81mg .   Darvon-N [Propoxyphene] Rash and Other (See Comments)    Darvocet    Ery-Tab [Erythromycin] Nausea And Vomiting   Glucophage  [Metformin ] Diarrhea, Nausea Only and Other (See Comments)    General GI intolerance    Motrin [Ibuprofen] Other (See Comments)     Stomach pain   Neurontin [Gabapentin] Other (See Comments)    Suicidal ideations     Family History:  Family History  Problem Relation Age of Onset   Diabetes Mother    Hypertension Mother    Arthritis Mother        RA   Pancreatic cancer Father    Stomach cancer Neg Hx    Colon cancer Neg Hx    Esophageal cancer Neg Hx    Rectal cancer Neg Hx     Social History:  Social History   Tobacco Use   Smoking status: Never   Smokeless tobacco: Never  Vaping Use   Vaping status: Never Used  Substance Use Topics   Alcohol use: No   Drug use: No    Review of symptoms:  Constitutional:  Negative for unexplained weight loss, night sweats, fever, chills ENT:  Negative for nose bleeds, sinus pain, painful swallowing CV:  Negative for chest pain, shortness of breath, exercise intolerance, palpitations, loss of consciousness Resp:  Negative for cough, wheezing, shortness of breath GI:  Negative for nausea, vomiting, diarrhea, bloody stools GU:  Positives noted in HPI; otherwise negative for gross hematuria, dysuria, urinary incontinence Neuro:  Negative for seizures, poor balance, limb weakness, slurred speech Psych:  Negative for lack of energy, depression, anxiety Endocrine:  Negative for polydipsia, polyuria, symptoms of hypoglycemia (dizziness, hunger,  sweating) Hematologic:  Negative for anemia, purpura, petechia, prolonged or excessive bleeding, use of anticoagulants  Allergic:  Negative for difficulty breathing or choking as a result of exposure to anything; no shellfish allergy; no allergic response (rash/itch) to materials, foods  Physical exam: LMP 11/16/2014 (Approximate)  GENERAL APPEARANCE:  Well appearing, well developed, well nourished, NAD HEENT: Atraumatic, Normocephalic, oropharynx clear. NECK: Supple without lymphadenopathy or thyromegaly. LUNGS: Clear to auscultation bilaterally. HEART: Regular Rate and Rhythm without murmurs, gallops, or rubs. ABDOMEN: Soft,  non-tender, No Masses. EXTREMITIES: Moves all extremities well.  Without clubbing, cyanosis, or edema. NEUROLOGIC:  Alert and oriented x 3, normal gait, CN II-XII grossly intact.  MENTAL STATUS:  Appropriate. BACK:  Non-tender to palpation.  No CVAT SKIN:  Warm, dry and intact.    Results: U/A:

## 2024-07-26 ENCOUNTER — Telehealth: Payer: Self-pay | Admitting: Family Medicine

## 2024-07-26 NOTE — Telephone Encounter (Signed)
 Pt confirmed appt 07/26/2024

## 2024-07-27 ENCOUNTER — Encounter: Payer: Self-pay | Admitting: Family Medicine

## 2024-07-27 ENCOUNTER — Other Ambulatory Visit: Payer: Self-pay

## 2024-07-27 ENCOUNTER — Ambulatory Visit: Admitting: Family Medicine

## 2024-07-27 VITALS — BP 104/66 | HR 81 | Temp 98.0°F | Ht 64.0 in | Wt 107.4 lb

## 2024-07-27 DIAGNOSIS — Z794 Long term (current) use of insulin: Secondary | ICD-10-CM | POA: Diagnosis not present

## 2024-07-27 DIAGNOSIS — I2510 Atherosclerotic heart disease of native coronary artery without angina pectoris: Secondary | ICD-10-CM | POA: Diagnosis not present

## 2024-07-27 DIAGNOSIS — I5042 Chronic combined systolic (congestive) and diastolic (congestive) heart failure: Secondary | ICD-10-CM | POA: Diagnosis not present

## 2024-07-27 DIAGNOSIS — E1159 Type 2 diabetes mellitus with other circulatory complications: Secondary | ICD-10-CM

## 2024-07-27 DIAGNOSIS — I110 Hypertensive heart disease with heart failure: Secondary | ICD-10-CM | POA: Diagnosis not present

## 2024-07-27 DIAGNOSIS — E1165 Type 2 diabetes mellitus with hyperglycemia: Secondary | ICD-10-CM | POA: Diagnosis not present

## 2024-07-27 DIAGNOSIS — I152 Hypertension secondary to endocrine disorders: Secondary | ICD-10-CM | POA: Diagnosis not present

## 2024-07-27 DIAGNOSIS — E876 Hypokalemia: Secondary | ICD-10-CM | POA: Diagnosis not present

## 2024-07-27 DIAGNOSIS — E119 Type 2 diabetes mellitus without complications: Secondary | ICD-10-CM

## 2024-07-27 DIAGNOSIS — N312 Flaccid neuropathic bladder, not elsewhere classified: Secondary | ICD-10-CM | POA: Diagnosis not present

## 2024-07-27 LAB — POCT GLYCOSYLATED HEMOGLOBIN (HGB A1C): HbA1c, POC (controlled diabetic range): 12.1 % — AB (ref 0.0–7.0)

## 2024-07-27 MED ORDER — SPIRONOLACTONE 25 MG PO TABS
25.0000 mg | ORAL_TABLET | Freq: Every day | ORAL | 1 refills | Status: AC
  Filled 2024-07-27: qty 90, 90d supply, fill #0

## 2024-07-27 MED ORDER — FUROSEMIDE 20 MG PO TABS
20.0000 mg | ORAL_TABLET | Freq: Every day | ORAL | 1 refills | Status: AC
  Filled 2024-07-27: qty 90, 90d supply, fill #0

## 2024-07-27 MED ORDER — INSULIN ASPART 100 UNIT/ML FLEXPEN
0.0000 [IU] | PEN_INJECTOR | Freq: Three times a day (TID) | SUBCUTANEOUS | 3 refills | Status: AC
  Filled 2024-07-27: qty 30, 83d supply, fill #0

## 2024-07-27 MED ORDER — PEN NEEDLES 31G X 5 MM MISC
1.0000 | Freq: Three times a day (TID) | 3 refills | Status: AC
  Filled 2024-07-27: qty 100, 33d supply, fill #0
  Filled 2024-09-13: qty 100, 33d supply, fill #1

## 2024-07-27 MED ORDER — POTASSIUM CHLORIDE CRYS ER 20 MEQ PO TBCR
20.0000 meq | EXTENDED_RELEASE_TABLET | Freq: Two times a day (BID) | ORAL | 1 refills | Status: AC
  Filled 2024-07-27: qty 180, 90d supply, fill #0

## 2024-07-27 NOTE — Progress Notes (Signed)
 Subjective:  Patient ID: Brooke [name], female    DOB: 1960/12/11  Age: 63 y.o. MRN: [id number]  CC: Medical Management of Chronic Issues     Discussed the use of AI scribe software for clinical note transcription with the patient, who gave verbal consent to proceed.  History of Present Illness Brooke [name] is a 63 year old female with  type 2 diabetes mellitus, HFmEF (EF 55 to 60% on echo of 04/2022 improved from 20 to 25%), CAD (status post stent), PAD, hypertension, anxiety and depression, protein calorie malnutrition, history of dysphagia, status post esophageal dilatation (in 10/2022) lumbar radiculopathy, atonic bladder (Foley catheter in place), hydronephrosis*post right ureteral stent placement who presents today for follow-up of her diabetes.  She was hospitalized 02/25/2024 through 07/01/2024 for CHF exacerbation and a BNP of greater than 1800.  Echo revealed EF of 20 to 25%, global hypokinesis, grade 2 DD.  She underwent cardiac cath revealing multivessel CAD and subsequently underwent PCI to left main.  She was maintained on Plavix  and aspirin .  Since discharge, leg edema has improved but she still has ankle swelling that she manages with leg elevation and tapping her feet.  She is taking aspirin  and Plavix  for 12 months after the stent. She is on Lasix  (furosemide ) 20 mg but did not take a dose this morning.    Her diabetes control has worsened, with A1c rising from 10.7 to 12.1 despite insulin . At home, fasting blood sugars are usually 80-90 mg/dL and postprandial 829-819 mg/dL. She uses Lantus  18-20 units daily based on blood sugar and Novolog  sliding scale three times daily with meals.  During her recent hospitalization her fasting blood sugars were often 55-60 mg/dL, prompting insulin  adjustments.      Past Medical History:  Diagnosis Date   Abdominal pain, left lower quadrant 12/17/2015   Anemia    Anxiety 07/01/2016   Carotid artery disease 10/27/2022   Chest pain,  unspecified 10/05/2003   CHF (congestive heart failure) (HCC)    Chronic systolic heart failure (HCC) 02/20/2022   Depression, major, recurrent, moderate (HCC) 07/31/2005   Diabetes mellitus without complication (HCC) 09/08/2006   Endometriosis 03/28/1996   GERD (gastroesophageal reflux disease)    Heart murmur    History of colonic polyps 08/07/2015   20 mm removed 05/10/15 by Dr. Kristie     PAD (peripheral artery disease)    Unspecified constipation 06/24/2004    Past Surgical History:  Procedure Laterality Date   ABDOMINAL AORTOGRAM W/LOWER EXTREMITY Left 07/29/2023   Procedure: ABDOMINAL AORTOGRAM W/LOWER EXTREMITY;  Surgeon: Gretta Lonni PARAS, MD;  Location: [facility] INVASIVE CV LAB;  Service: Cardiovascular;  Laterality: Left;   APPENDECTOMY  08/10/1994   Dr. Buford   BREAST SURGERY Bilateral 08/10/1994   Dr. Ardeen   calcifications removed   CORONARY STENT INTERVENTION N/A 06/30/2024   Procedure: CORONARY STENT INTERVENTION;  Surgeon: Ladona Heinz, MD;  Location: [facility] INVASIVE CV LAB;  Service: Cardiovascular;  Laterality: N/A;   CORONARY ULTRASOUND/IVUS N/A 06/30/2024   Procedure: Coronary Ultrasound/IVUS;  Surgeon: Ladona Heinz, MD;  Location: [facility] INVASIVE CV LAB;  Service: Cardiovascular;  Laterality: N/A;  LM   CYSTOSCOPY W/ RETROGRADES Right 05/01/2024   Procedure: CYSTOSCOPY, WITH RETROGRADE PYELOGRAM;  Surgeon: Selma Donnice SAUNDERS, MD;  Location: [facility] ORS;  Service: Urology;  Laterality: Right;   CYSTOSCOPY W/ URETERAL STENT PLACEMENT Right 03/02/2024   Procedure: CYSTOSCOPY, WITH RETROGRADE PYELOGRAM AND URETERAL STENT INSERTION;  Surgeon: Selma Donnice SAUNDERS, MD;  Location: [facility] OR;  Service:  Urology;  Laterality: Right;   CYSTOSCOPY WITH BIOPSY N/A 05/01/2024   Procedure: CYSTOSCOPY, WITH URETERAL BIOPSY;  Surgeon: Selma Donnice SAUNDERS, MD;  Location: [facility] ORS;  Service: Urology;  Laterality: N/A;   CYSTOSCOPY/URETEROSCOPY/HOLMIUM LASER/STENT PLACEMENT Right 05/01/2024   Procedure:  CYSTOSCOPY/URETEROSCOPY/EXCHANGE;  Surgeon: Selma Donnice SAUNDERS, MD;  Location: [facility] ORS;  Service: Urology;  Laterality: Right;   ESOPHAGEAL MANOMETRY N/A 03/23/2023   Procedure: ESOPHAGEAL MANOMETRY (EM);  Surgeon: Leigh Elspeth SQUIBB, MD;  Location: [facility] ENDOSCOPY;  Service: Gastroenterology;  Laterality: N/A;   INGUINAL HERNIA REPAIR     OVARIAN CYST REMOVAL     PERIPHERAL VASCULAR BALLOON ANGIOPLASTY Left 07/29/2023   Procedure: PERIPHERAL VASCULAR BALLOON ANGIOPLASTY;  Surgeon: Gretta Lonni PARAS, MD;  Location: [facility] INVASIVE CV LAB;  Service: Cardiovascular;  Laterality: Left;   RIGHT/LEFT HEART CATH AND CORONARY ANGIOGRAPHY N/A 10/09/2021   Procedure: RIGHT/LEFT HEART CATH AND CORONARY ANGIOGRAPHY;  Surgeon: Wendel Lurena POUR, MD;  Location: [facility] INVASIVE CV LAB;  Service: Cardiovascular;  Laterality: N/A;   RIGHT/LEFT HEART CATH AND CORONARY ANGIOGRAPHY N/A 06/29/2024   Procedure: RIGHT/LEFT HEART CATH AND CORONARY ANGIOGRAPHY;  Surgeon: Mady Lonni, MD;  Location: [facility] INVASIVE CV LAB;  Service: Cardiovascular;  Laterality: N/A;   TOOTH EXTRACTION N/A 07/28/2022   Procedure: EXTRACTION ALL REMAING TEETH THREE, FOUR, FIVE, SIX, SEVEN, EIGHT, NINE, TEN, ELEVEN, TWELVE, TWENTY ONE, TWENTY TWO, TWENTY THREE, TWENTY FOUR, TWENTY FIVE, TWENTY SIX, TWENTY SEVEN, TWENTY EIGHT, TWENTY NINE, THIRTY, AVEOLOPLASTY;  Surgeon: Sheryle Hamilton, DMD;  Location: [facility] OR;  Service: Oral Surgery;  Laterality: N/A;    Family History  Problem Relation Age of Onset   Diabetes Mother    Hypertension Mother    Arthritis Mother        RA   Pancreatic cancer Father    Stomach cancer Neg Hx    Colon cancer Neg Hx    Esophageal cancer Neg Hx    Rectal cancer Neg Hx     Social History   Socioeconomic History   Marital status: Married    Spouse name: Beryl   Number of children: 1   Years of education: Not on file   Highest education level: Bachelor's degree (e.g., BA, AB, BS)  Occupational History   Not on file   Tobacco Use   Smoking status: Never   Smokeless tobacco: Never  Vaping Use   Vaping status: Never Used  Substance and Sexual Activity   Alcohol use: No   Drug use: No   Sexual activity: Not Currently  Other Topics Concern   Not on file  Social History Narrative   Not on file   Social Drivers of Health   Tobacco Use: Low Risk (06/27/2024)   Patient History    Smoking Tobacco Use: Never    Smokeless Tobacco Use: Never    Passive Exposure: Not on file  Financial Resource Strain: Low Risk (06/29/2024)   Overall Financial Resource Strain (CARDIA)    Difficulty of Paying Living Expenses: Not hard at all  Food Insecurity: Food Insecurity Present (06/28/2024)   Epic    Worried About Programme Researcher, Broadcasting/film/video in the Last Year: Sometimes true    Ran Out of Food in the Last Year: Sometimes true  Transportation Needs: No Transportation Needs (06/28/2024)   Epic    Lack of Transportation (Medical): No    Lack of Transportation (Non-Medical): No  Physical Activity: Not on file  Stress: Not on file  Social Connections: Not on file  Depression (PHQ2-9):  Medium Risk (06/15/2024)   Depression (PHQ2-9)    PHQ-2 Score: 9  Alcohol Screen: Low Risk (06/29/2024)   Alcohol Screen    Last Alcohol Screening Score (AUDIT): 0  Housing: Low Risk (06/28/2024)   Epic    Unable to Pay for Housing in the Last Year: No    Number of Times Moved in the Last Year: 0    Homeless in the Last Year: No  Utilities: Not At Risk (06/28/2024)   Epic    Threatened with loss of utilities: No  Health Literacy: Not on file    Allergies[1]  Outpatient Medications Prior to Visit  Medication Sig Dispense Refill   acetaminophen  (TYLENOL ) 500 MG tablet Take 1,000 mg by mouth every 6 (six) hours as needed for headache or fever (pain).     aspirin  81 MG chewable tablet Chew 1 tablet (81 mg total) by mouth daily. 30 tablet 1   atorvastatin  (LIPITOR ) 80 MG tablet Take 1 tablet (80 mg total) by mouth daily. 90 tablet 1    clopidogrel  (PLAVIX ) 75 MG tablet Take 1 tablet (75 mg total) by mouth daily. 90 tablet 1   Glucose Blood (BLOOD GLUCOSE TEST STRIPS) STRP 1 each by Does not apply route 3 (three) times daily. Use as directed to check blood sugar. May dispense any manufacturer covered by patient's insurance and fits patient's device. 100 strip 0   insulin  glargine (LANTUS ) 100 UNIT/ML Solostar Pen Inject 20 Units into the skin daily. 15 mL 3   Iron , Ferrous Sulfate , 325 (65 Fe) MG TABS Take 325 mg by mouth daily. 30 tablet 1   Lancet Device MISC 1 each by Does not apply route 3 (three) times daily. May dispense any manufacturer covered by patient's insurance. 1 each 0   metoprolol  succinate (TOPROL -XL) 25 MG 24 hr tablet Take 1 tablet (25 mg total) by mouth daily. 30 tablet 0   sacubitril -valsartan  (ENTRESTO ) 49-51 MG Take 1 tablet by mouth 2 (two) times daily. 180 tablet 0   vitamin B-12 (CYANOCOBALAMIN) 1000 MCG tablet Take 3,000 mcg by mouth daily.     furosemide  (LASIX ) 20 MG tablet Take 1 tablet (20 mg total) by mouth daily. 30 tablet 0   insulin  aspart (NOVOLOG ) 100 UNIT/ML FlexPen Inject 3-14 Units into the skin 3 (three) times daily with meals. If eating and Blood Glucose (BG) 80 or higher inject 3 units for meal coverage and add correction dose per scale. If not eating, correction dose only. BG <150= 0 unit; BG 150-200= 1 unit; BG 201-250= 3 unit; BG 251-300= 5 unit; BG 301-350= 7 unit; BG 351-400= 9 unit; BG >400= 11 unit and Call Primary Care. 15 mL 0   Insulin  Pen Needle (PEN NEEDLES) 31G X 5 MM MISC Use 3 times a day. 100 each 0   potassium chloride  SA (KLOR-CON  M) 20 MEQ tablet Take 1 tablet (20 mEq total) by mouth 2 (two) times daily. 60 tablet 0   spironolactone  (ALDACTONE ) 25 MG tablet Take 1 tablet (25 mg total) by mouth daily. 30 tablet 0   No facility-administered medications prior to visit.     ROS Review of Systems  Constitutional:  Negative for activity change and appetite change.  HENT:   Negative for sinus pressure and sore throat.   Respiratory:  Negative for chest tightness, shortness of breath and wheezing.   Cardiovascular:  Negative for chest pain and palpitations.  Gastrointestinal:  Negative for abdominal distention, abdominal pain and constipation.  Genitourinary: Negative.  Musculoskeletal: Negative.   Psychiatric/Behavioral:  Negative for behavioral problems and dysphoric mood.     Objective:  BP 104/66   Pulse 81   Temp 98 F (36.7 C) (Oral)   Ht 5' 4 (1.626 m)   Wt 107 lb 6.4 oz (48.7 kg)   LMP 11/16/2014   SpO2 100%   BMI 18.44 kg/m      07/27/2024    9:12 AM 07/01/2024    4:36 PM 07/01/2024   12:20 PM  BP/Weight  Systolic BP 104 106 92  Diastolic BP 66 66 58  Wt. (Lbs) 107.4    BMI 18.44 kg/m2        Physical Exam Constitutional:      Appearance: She is well-developed.  Cardiovascular:     Rate and Rhythm: Normal rate.     Heart sounds: Normal heart sounds. No murmur heard. Pulmonary:     Effort: Pulmonary effort is normal.     Breath sounds: Normal breath sounds. No wheezing or rales.  Chest:     Chest wall: No tenderness.  Abdominal:     General: Bowel sounds are normal. There is no distension.     Palpations: Abdomen is soft. There is no mass.     Tenderness: There is no abdominal tenderness.  Musculoskeletal:        General: Normal range of motion.     Right lower leg: Edema (1+ edema of dorsum of foot) present.     Left lower leg: Edema (1+ edema of dorsum of foot) present.  Neurological:     Mental Status: She is alert and oriented to person, place, and time.  Psychiatric:        Mood and Affect: Mood normal.        Latest Ref Rng & Units 07/01/2024    2:20 AM 06/30/2024    2:52 PM 06/30/2024    7:10 AM  CMP  Glucose 70 - 99 mg/dL 824   823   BUN 8 - 23 mg/dL 7   10   Creatinine 9.55 - 1.00 mg/dL 9.27   9.34   Sodium 864 - 145 mmol/L 137  138  135   Potassium 3.5 - 5.1 mmol/L 4.2  3.3  3.2   Chloride 98 -  111 mmol/L 103   98   CO2 22 - 32 mmol/L 25   28   Calcium  8.9 - 10.3 mg/dL 8.0   7.9     Lipid Panel     Component Value Date/Time   CHOL 122 06/29/2024 0247   CHOL 192 12/13/2015 0856   TRIG 72 06/29/2024 0247   HDL 33 (L) 06/29/2024 0247   HDL 41 12/13/2015 0856   CHOLHDL 3.7 06/29/2024 0247   VLDL 14 06/29/2024 0247   LDLCALC 75 06/29/2024 0247   LDLCALC 118 (H) 12/13/2015 0856    CBC    Component Value Date/Time   WBC 7.6 07/01/2024 0220   RBC 3.95 07/01/2024 0220   HGB 9.7 (L) 07/01/2024 0220   HGB 10.9 (L) 06/15/2024 1057   HCT 30.7 (L) 07/01/2024 0220   HCT 36.4 06/15/2024 1057   PLT 322 07/01/2024 0220   PLT 333 06/15/2024 1057   MCV 77.7 (L) 07/01/2024 0220   MCV 81 06/15/2024 1057   MCH 24.6 (L) 07/01/2024 0220   MCHC 31.6 07/01/2024 0220   RDW 15.6 (H) 07/01/2024 0220   RDW 14.1 06/15/2024 1057   LYMPHSABS 1.8 06/27/2024 1336   LYMPHSABS 2.2 06/15/2024 1057  MONOABS 0.4 06/27/2024 1336   EOSABS 0.0 06/27/2024 1336   EOSABS 0.1 06/15/2024 1057   BASOSABS 0.0 06/27/2024 1336   BASOSABS 0.0 06/15/2024 1057    Lab Results  Component Value Date   HGBA1C 12.1 (A) 07/27/2024       Assessment & Plan Type 2 diabetes mellitus, uncontrolled A1c at 12.1 indicates poor control. Discrepancy between A1c and home glucose readings suggests monitoring or insulin  issues.  She would be an ideal candidate for CGM but she states she was informed against using a CGM - Referred to endocrinologist. - Provided updated sliding scale insulin  instructions:  For blood sugars 0-150 give 0 units of insulin , 151-200 give 2 units of insulin , 201-250 give 4 units, 251-300 give 6 units, 301-350 give 8 units, 351-400 give 10 units,> 400 give 12 units and call M.D. Discussed hypoglycemia protocol. - Ordered basic metabolic panel and complete blood count.  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 Improvement in edema with diuretics. Mild ankle swelling persists. Potassium monitoring needed due to diuretics. - Continue furosemide  20 mg daily. - Refilled spironolactone  for 90 days. - Monitor potassium levels with basic metabolic panel.  Coronary artery disease, post stent placement Recent stent placement. On dual antiplatelet therapy. No recent cardiology follow-up due to mobility issues. - Continue aspirin  and clopidogrel  for 12 months post-stent. - Advised to schedule follow-up with cardiologist.  Atonic bladder Has indwelling Foley catheter - She missed her appointment with urology and plans to reschedule  Hypokalemia - Diuretic induced - Continue potassium replacement      Meds ordered this encounter  Medications   insulin  aspart (NOVOLOG ) 100 UNIT/ML FlexPen    Sig: Inject 0-12 Units into the skin 3 (three) times daily with meals.    Dispense:  30 mL    Refill:  3    For blood sugars 0-150 give 0 units of insulin , 151-200 give 2 units of insulin , 201-250 give 4 units, 251-300 give 6 units, 301-350 give 8 units, 351-400 give 10 units,> 400 give 12 units and call M.D.   Insulin  Pen Needle (PEN NEEDLES) 31G X 5 MM MISC    Sig: Use 3 times a day.    Dispense:  100 each    Refill:  3   potassium chloride  SA (KLOR-CON  M) 20 MEQ tablet    Sig: Take 1 tablet (20 mEq total) by mouth 2 (two) times daily.    Dispense:  180 tablet    Refill:  1   furosemide  (LASIX ) 20 MG tablet    Sig: Take 1 tablet (20 mg total) by mouth daily.    Dispense:  90 tablet    Refill:  1   spironolactone  (ALDACTONE ) 25 MG tablet    Sig: Take 1 tablet (25 mg total) by mouth daily.    Dispense:  90 tablet    Refill:  1    Follow-up: Return in about 3 months (around 10/25/2024) for Chronic medical conditions.       Corrina Sabin, MD, FAAFP. [facility] [place], [place] [phone]   07/27/2024, 1:06 PM     [1]  Allergies Allergen  Reactions   Bayer Aspirin  [Aspirin ] Other (See Comments)    Stomach pain occurs with 325mg  dose, able to tolerate 81mg .   Darvon-N [Propoxyphene] Rash and Other (See Comments)    Darvocet    Ery-Tab [Erythromycin] Nausea And Vomiting   Glucophage  [Metformin ] Diarrhea, Nausea  Only and Other (See Comments)    General GI intolerance    Motrin [Ibuprofen] Other (See Comments)    Stomach pain   Neurontin [Gabapentin] Other (See Comments)    Suicidal ideations

## 2024-07-27 NOTE — Patient Instructions (Signed)
 VISIT SUMMARY:  Today, we discussed your concerns about fluid retention and blood sugar management. You have experienced leg swelling and shortness of breath, and you were recently hospitalized for severe symptoms related to your heart condition. We reviewed your medications and made some adjustments to help manage your conditions better.  YOUR PLAN:  -TYPE 2 DIABETES MELLITUS, UNCONTROLLED: Your blood sugar levels are not well controlled, which is indicated by your A1c level of 12.1. This can increase the risk of complications, including issues with your heart stent. We have referred you to an endocrinologist for specialized care and provided updated instructions for your insulin  dosage. We also ordered some blood tests to monitor your condition.  -HYPERTENSIVE HEART DISEASE WITH COMBINED SYSTOLIC AND DIASTOLIC CONGESTIVE HEART FAILURE: This condition means your heart is struggling to pump blood effectively, leading to fluid buildup. You have been experiencing leg swelling and shortness of breath. We will continue your current medications, including furosemide  and spironolactone , and monitor your potassium levels to ensure they remain stable.  -CORONARY ARTERY DISEASE, POST STENT PLACEMENT: You recently had a stent placed to help keep your coronary arteries open. It is important to continue taking aspirin  and clopidogrel  for 12 months to prevent the stent from becoming blocked. Please schedule a follow-up appointment with your cardiologist to ensure everything is progressing well.  INSTRUCTIONS:  Please follow up with the endocrinologist for your diabetes management and schedule an appointment with your cardiologist for a check-up. Continue taking your medications as prescribed and monitor your blood sugar levels and any swelling in your legs. We will review your blood test results at your next visit.

## 2024-07-28 ENCOUNTER — Ambulatory Visit: Payer: Self-pay | Admitting: Family Medicine

## 2024-07-28 LAB — BASIC METABOLIC PANEL WITH GFR
BUN/Creatinine Ratio: 20 (ref 12–28)
BUN: 18 mg/dL (ref 8–27)
CO2: 20 mmol/L (ref 20–29)
Calcium: 10.2 mg/dL (ref 8.7–10.3)
Chloride: 98 mmol/L (ref 96–106)
Creatinine, Ser: 0.92 mg/dL (ref 0.57–1.00)
Glucose: 282 mg/dL — ABNORMAL HIGH (ref 70–99)
Potassium: 4.5 mmol/L (ref 3.5–5.2)
Sodium: 138 mmol/L (ref 134–144)
eGFR: 71 mL/min/1.73

## 2024-07-28 LAB — CBC WITH DIFFERENTIAL/PLATELET
Basophils Absolute: 0 x10E3/uL (ref 0.0–0.2)
Basos: 0 %
EOS (ABSOLUTE): 0.1 x10E3/uL (ref 0.0–0.4)
Eos: 2 %
Hematocrit: 39.3 % (ref 34.0–46.6)
Hemoglobin: 12.3 g/dL (ref 11.1–15.9)
Immature Grans (Abs): 0 x10E3/uL (ref 0.0–0.1)
Immature Granulocytes: 0 %
Lymphocytes Absolute: 2.9 x10E3/uL (ref 0.7–3.1)
Lymphs: 35 %
MCH: 24.8 pg — ABNORMAL LOW (ref 26.6–33.0)
MCHC: 31.3 g/dL — ABNORMAL LOW (ref 31.5–35.7)
MCV: 79 fL (ref 79–97)
Monocytes Absolute: 0.6 x10E3/uL (ref 0.1–0.9)
Monocytes: 7 %
Neutrophils Absolute: 4.5 x10E3/uL (ref 1.4–7.0)
Neutrophils: 56 %
Platelets: 359 x10E3/uL (ref 150–450)
RBC: 4.95 x10E6/uL (ref 3.77–5.28)
RDW: 15.2 % (ref 11.7–15.4)
WBC: 8.2 x10E3/uL (ref 3.4–10.8)

## 2024-08-01 ENCOUNTER — Telehealth: Payer: Self-pay

## 2024-08-01 ENCOUNTER — Other Ambulatory Visit: Payer: Self-pay

## 2024-08-01 ENCOUNTER — Other Ambulatory Visit: Payer: Self-pay | Admitting: Pharmacist

## 2024-08-01 MED ORDER — ACCU-CHEK GUIDE TEST VI STRP
ORAL_STRIP | 6 refills | Status: AC
  Filled 2024-08-01: qty 100, 33d supply, fill #0
  Filled 2024-09-13: qty 100, 33d supply, fill #1

## 2024-08-01 MED ORDER — ACCU-CHEK GUIDE W/DEVICE KIT
PACK | 0 refills | Status: AC
  Filled 2024-08-01: qty 1, 30d supply, fill #0

## 2024-08-01 MED ORDER — ACCU-CHEK SOFTCLIX LANCETS MISC
6 refills | Status: AC
  Filled 2024-08-01: qty 100, 33d supply, fill #0
  Filled 2024-09-13: qty 100, 33d supply, fill #1

## 2024-08-01 NOTE — Telephone Encounter (Signed)
 Copied from CRM #[id number]. Topic: Clinical - Prescription Issue >> Aug 01, 2024  3:03 PM Amber H wrote: Reason for CRM: Patient called and stated she had a stent placed and she cannot use the Continuous Glucose Monitors anymore and has to use the manual pin prick for her blood glucose. She stated that she was prescribed one by provider a few months ago I did not see this prescription in file. Patient sated Dr. Newlin had prescribed it to her. She stated she is also needing the Glucose Blood (BLOOD GLUCOSE TEST STRIPS)    [phone]

## 2024-08-01 NOTE — Telephone Encounter (Signed)
 Rxn for glucometer and supplies sent to pharmacy.

## 2024-08-17 ENCOUNTER — Other Ambulatory Visit: Payer: Self-pay | Admitting: Family Medicine

## 2024-08-17 ENCOUNTER — Other Ambulatory Visit: Payer: Self-pay

## 2024-08-18 ENCOUNTER — Other Ambulatory Visit: Payer: Self-pay

## 2024-08-18 MED ORDER — METOPROLOL SUCCINATE ER 25 MG PO TB24
25.0000 mg | ORAL_TABLET | Freq: Every day | ORAL | 0 refills | Status: DC
  Filled 2024-08-18: qty 30, 30d supply, fill #0

## 2024-08-24 ENCOUNTER — Encounter: Payer: Self-pay | Admitting: Urology

## 2024-08-24 ENCOUNTER — Other Ambulatory Visit: Payer: Self-pay

## 2024-08-24 ENCOUNTER — Ambulatory Visit: Admitting: Urology

## 2024-08-24 VITALS — BP 118/74 | HR 88 | Ht 64.0 in | Wt 108.0 lb

## 2024-08-24 DIAGNOSIS — N312 Flaccid neuropathic bladder, not elsewhere classified: Secondary | ICD-10-CM

## 2024-08-24 DIAGNOSIS — R8290 Unspecified abnormal findings in urine: Secondary | ICD-10-CM | POA: Diagnosis not present

## 2024-08-24 DIAGNOSIS — N319 Neuromuscular dysfunction of bladder, unspecified: Secondary | ICD-10-CM | POA: Insufficient documentation

## 2024-08-24 DIAGNOSIS — N134 Hydroureter: Secondary | ICD-10-CM | POA: Diagnosis not present

## 2024-08-24 LAB — URINALYSIS, ROUTINE W REFLEX MICROSCOPIC
Bilirubin, UA: NEGATIVE
Glucose, UA: NEGATIVE
Ketones, UA: NEGATIVE
Nitrite, UA: NEGATIVE
Specific Gravity, UA: 1.015 (ref 1.005–1.030)
Urobilinogen, Ur: 0.2 mg/dL (ref 0.2–1.0)
pH, UA: 5 (ref 5.0–7.5)

## 2024-08-24 LAB — MICROSCOPIC EXAMINATION

## 2024-08-24 MED ORDER — SULFAMETHOXAZOLE-TRIMETHOPRIM 800-160 MG PO TABS
1.0000 | ORAL_TABLET | Freq: Two times a day (BID) | ORAL | 0 refills | Status: DC
  Filled 2024-08-24: qty 14, 7d supply, fill #0

## 2024-08-24 NOTE — Progress Notes (Signed)
 "  Assessment: 1. Hypotonic neurogenic bladder   2. Hydroureter on right   3. Abnormal urine findings     Plan: I personally reviewed the patient's chart including provider notes, lab and imaging results. She has had a fairly extensive workup including cystoscopy, right ureteroscopy, and urodynamics.  Based on the studies she appears to have a hypotonic/areflexic bladder with right vesicoureteral reflux. I discussed options for management of her neurogenic bladder including continued Foley catheter drainage, SP tube placement, and clean intermittent catheterization.  I strongly recommended that she consider CIC due to the decreased risk of infection and the opportunity to potentially have a return of some bladder function with spontaneous voiding. She would like to proceed with CIC. Her Foley catheter was removed and she was instructed on CIC and was able to demonstrate this technique in the office. Begin Bactrim  DS twice daily x 7 days. Begin CIC 4 times daily.  Record cath volumes. Return to office in 3-4 weeks.  Chief Complaint:  Chief Complaint  Patient presents with   Hypotonic bladder    History of Present Illness:  Brooke [name] is a 64 y.o. female who is seen for evaluation of areflexic bladder and right hydronephrosis. She reports a history of UTIs within the past year. Urine culture from 6/25 grew 60K Klebsiella. She presented to the emergency room in July 2025 with lower abdominal pain, anorexia, nausea, fatigue, and malaise.  She had been treated for a complicated UTI for approximately 2 weeks prior to her arrival.  She was found to have AKI with a creatinine of 1.6.  CT abdomen and pelvis in July 2025 revealed moderate right hydronephrosis with marked right hydroureter down to the level of the right UVJ and diffuse bladder wall thickening.  She underwent right ureteral stent placement on 03/02/2024 by Dr. Selma.  Right retrograde pyelogram showed severe right hydronephrosis  with a torturous ureter and a complete 360 degree loop in the proximal right ureter.  She was also found to have a distended bladder with purulent debris.  Urine culture from July 2025 grew strep and MRSE.  Urodynamics in August 2025 showed a maximal capacity of 351 mL with instability and 1 unstable detrusor contraction.  She was able to generate a voluntary contraction and void 134 mL with a max flow of 5 mL/s.  PVR was 216 mL.  Right vesicoureteral reflux was noted.  A Foley catheter was placed following the procedure. She underwent right ureteroscopy with brush biopsy and stent exchange on 05/01/2024.  Pathology showed filamentous hyphae and yeast consistent with Candida albicans. CT imaging from 05/11/2024 showed a right ureteral stent in position, persistent mild dilation of the right renal pelvis and marked hydroureter, prominent bladder distention with Foley catheter in place.  Her right ureteral stent was removed in 10/25.  Creatinine from 12/25: 0.92  She presents today for a second opinion regarding management of her urologic issues.  She has a Foley catheter in place which apparently has not been changed since her admission to the hospital in early October 2025.  The catheter has been draining fairly well.  She has not had any recent fevers or chills.  No gross hematuria.   Past Medical History:  Past Medical History:  Diagnosis Date   Abdominal pain, left lower quadrant 12/17/2015   Anemia    Anxiety 07/01/2016   Carotid artery disease 10/27/2022   Chest pain, unspecified 10/05/2003   CHF (congestive heart failure) (HCC)    Chronic systolic heart failure (  HCC) 02/20/2022   Depression, major, recurrent, moderate (HCC) 07/31/2005   Diabetes mellitus without complication (HCC) 09/08/2006   Endometriosis 03/28/1996   GERD (gastroesophageal reflux disease)    Heart murmur    History of colonic polyps 08/07/2015   20 mm removed 05/10/15 by Dr. Kristie     PAD (peripheral artery disease)     Unspecified constipation 06/24/2004    Past Surgical History:  Past Surgical History:  Procedure Laterality Date   ABDOMINAL AORTOGRAM W/LOWER EXTREMITY Left 07/29/2023   Procedure: ABDOMINAL AORTOGRAM W/LOWER EXTREMITY;  Surgeon: Gretta Lonni PARAS, MD;  Location: [facility] INVASIVE CV LAB;  Service: Cardiovascular;  Laterality: Left;   APPENDECTOMY  08/10/1994   Dr. Buford   BREAST SURGERY Bilateral 08/10/1994   Dr. Ardeen   calcifications removed   CORONARY STENT INTERVENTION N/A 06/30/2024   Procedure: CORONARY STENT INTERVENTION;  Surgeon: Ladona Heinz, MD;  Location: [facility] INVASIVE CV LAB;  Service: Cardiovascular;  Laterality: N/A;   CORONARY ULTRASOUND/IVUS N/A 06/30/2024   Procedure: Coronary Ultrasound/IVUS;  Surgeon: Ladona Heinz, MD;  Location: [facility] INVASIVE CV LAB;  Service: Cardiovascular;  Laterality: N/A;  LM   CYSTOSCOPY W/ RETROGRADES Right 05/01/2024   Procedure: CYSTOSCOPY, WITH RETROGRADE PYELOGRAM;  Surgeon: Selma Donnice SAUNDERS, MD;  Location: [facility] ORS;  Service: Urology;  Laterality: Right;   CYSTOSCOPY W/ URETERAL STENT PLACEMENT Right 03/02/2024   Procedure: CYSTOSCOPY, WITH RETROGRADE PYELOGRAM AND URETERAL STENT INSERTION;  Surgeon: Selma Donnice SAUNDERS, MD;  Location: [facility] OR;  Service: Urology;  Laterality: Right;   CYSTOSCOPY WITH BIOPSY N/A 05/01/2024   Procedure: CYSTOSCOPY, WITH URETERAL BIOPSY;  Surgeon: Selma Donnice SAUNDERS, MD;  Location: [facility];  Service: Urology;  Laterality: N/A;   CYSTOSCOPY/URETEROSCOPY/HOLMIUM LASER/STENT PLACEMENT Right 05/01/2024   Procedure: CYSTOSCOPY/URETEROSCOPY/EXCHANGE;  Surgeon: Selma Donnice SAUNDERS, MD;  Location: [facility];  Service: Urology;  Laterality: Right;   ESOPHAGEAL MANOMETRY N/A 03/23/2023   Procedure: ESOPHAGEAL MANOMETRY (EM);  Surgeon: Leigh Elspeth SQUIBB, MD;  Location: [facility];  Service: Gastroenterology;  Laterality: N/A;   INGUINAL HERNIA REPAIR     OVARIAN CYST REMOVAL     PERIPHERAL VASCULAR BALLOON ANGIOPLASTY Left 07/29/2023    Procedure: PERIPHERAL VASCULAR BALLOON ANGIOPLASTY;  Surgeon: Gretta Lonni PARAS, MD;  Location: [facility] INVASIVE CV LAB;  Service: Cardiovascular;  Laterality: Left;   RIGHT/LEFT HEART CATH AND CORONARY ANGIOGRAPHY N/A 10/09/2021   Procedure: RIGHT/LEFT HEART CATH AND CORONARY ANGIOGRAPHY;  Surgeon: Wendel Lurena POUR, MD;  Location: [facility] INVASIVE CV LAB;  Service: Cardiovascular;  Laterality: N/A;   RIGHT/LEFT HEART CATH AND CORONARY ANGIOGRAPHY N/A 06/29/2024   Procedure: RIGHT/LEFT HEART CATH AND CORONARY ANGIOGRAPHY;  Surgeon: Mady Lonni, MD;  Location: [facility] INVASIVE CV LAB;  Service: Cardiovascular;  Laterality: N/A;   TOOTH EXTRACTION N/A 07/28/2022   Procedure: EXTRACTION ALL REMAING TEETH THREE, FOUR, FIVE, SIX, SEVEN, EIGHT, NINE, TEN, ELEVEN, TWELVE, TWENTY ONE, TWENTY TWO, TWENTY THREE, TWENTY FOUR, TWENTY FIVE, TWENTY SIX, TWENTY SEVEN, TWENTY EIGHT, TWENTY NINE, THIRTY, AVEOLOPLASTY;  Surgeon: Sheryle Hamilton, DMD;  Location: [facility] OR;  Service: Oral Surgery;  Laterality: N/A;    Allergies:  Allergies[1]  Family History:  Family History  Problem Relation Age of Onset   Diabetes Mother    Hypertension Mother    Arthritis Mother        RA   Pancreatic cancer Father    Stomach cancer Neg Hx    Colon cancer Neg Hx    Esophageal cancer Neg Hx    Rectal cancer Neg Hx  Social History:  Social History[2]  Review of symptoms:  Constitutional:  Negative for unexplained weight loss, night sweats, fever, chills ENT:  Negative for nose bleeds, sinus pain, painful swallowing CV:  Negative for chest pain, shortness of breath, exercise intolerance, palpitations, loss of consciousness Resp:  Negative for cough, wheezing, shortness of breath GI:  Negative for nausea, vomiting, diarrhea, bloody stools GU:  Positives noted in HPI; otherwise negative for gross hematuria, dysuria, urinary incontinence Neuro:  Negative for seizures, poor balance, limb weakness, slurred speech Psych:  Negative  for lack of energy, depression, anxiety Endocrine:  Negative for polydipsia, polyuria, symptoms of hypoglycemia (dizziness, hunger, sweating) Hematologic:  Negative for anemia, purpura, petechia, prolonged or excessive bleeding, use of anticoagulants  Allergic:  Negative for difficulty breathing or choking as a result of exposure to anything; no shellfish allergy; no allergic response (rash/itch) to materials, foods  Physical exam: BP 118/74   Pulse 88   Ht 5' 4 (1.626 m)   Wt 108 lb (49 kg)   LMP 11/16/2014   BMI 18.54 kg/m  GENERAL APPEARANCE:  Well appearing, well developed, well nourished, NAD HEENT: Atraumatic, Normocephalic, oropharynx clear. NECK: Supple without lymphadenopathy or thyromegaly. LUNGS: Clear to auscultation bilaterally. HEART: Regular Rate and Rhythm without murmurs, gallops, or rubs. ABDOMEN: Soft, non-tender, No Masses. EXTREMITIES: Moves all extremities well.  Without clubbing, cyanosis, or edema. NEUROLOGIC:  Alert and oriented x 3, normal gait, CN II-XII grossly intact.  MENTAL STATUS:  Appropriate. BACK:  Non-tender to palpation.  No CVAT SKIN:  Warm, dry and intact.   GU:  foley in place draining cloudy urine  Results: U/A: 11-30 WBCs, 3-10 RBCs, many bacteria, many yeast, nitrite negative     [1]  Allergies Allergen Reactions   Bayer Aspirin  [Aspirin ] Other (See Comments)    Stomach pain occurs with 325mg  dose, able to tolerate 81mg .   Darvon-N [Propoxyphene] Rash and Other (See Comments)    Darvocet    Ery-Tab [Erythromycin] Nausea And Vomiting   Glucophage  [Metformin ] Diarrhea, Nausea Only and Other (See Comments)    General GI intolerance    Motrin [Ibuprofen] Other (See Comments)    Stomach pain   Neurontin [Gabapentin] Other (See Comments)    Suicidal ideations   [2]  Social History Tobacco Use   Smoking status: Never   Smokeless tobacco: Never  Vaping Use   Vaping status: Never Used  Substance Use Topics   Alcohol use: No    Drug use: No   "

## 2024-08-24 NOTE — Progress Notes (Signed)
 Catheter Removal  Patient is present today for a catheter removal.  8ml of water  was drained from the balloon. A 16FR foley cath was removed from the bladder, no complications were noted. Patient tolerated well.  Performed by: JAYSON Mainland CMA   Continuous Intermittent Catheterization  Due to hypotonic bladder patient is present today for a teaching of self I & O Catheterization. Patient was given detailed verbal and printed instructions of self catheterization. Patient was cleaned and prepped in a sterile fashion.  With instruction and assistance patient inserted a 14FR and urine return was noted 20 ml, urine was cloudy in color. Patient tolerated well, no complications were noted Patient was given a sample bag with supplies to take home.  Instructions were given per Dr Roseann for patient to cath 4 times daily.  An order was placed with [employer] for catheters to be sent to the patient's home.   Performed by: JAYSON Mainland CMA

## 2024-08-27 LAB — URINE CULTURE

## 2024-08-28 ENCOUNTER — Other Ambulatory Visit: Payer: Self-pay

## 2024-08-28 ENCOUNTER — Ambulatory Visit: Payer: Self-pay | Admitting: Urology

## 2024-08-28 DIAGNOSIS — R8290 Unspecified abnormal findings in urine: Secondary | ICD-10-CM

## 2024-08-28 MED ORDER — CIPROFLOXACIN HCL 500 MG PO TABS
500.0000 mg | ORAL_TABLET | Freq: Two times a day (BID) | ORAL | 0 refills | Status: AC
  Filled 2024-08-28: qty 14, 7d supply, fill #0

## 2024-08-28 MED ORDER — NITROFURANTOIN MONOHYD MACRO 100 MG PO CAPS
100.0000 mg | ORAL_CAPSULE | Freq: Every day | ORAL | 2 refills | Status: DC
  Filled 2024-08-28: qty 30, 30d supply, fill #0
  Filled 2024-09-01 – 2024-09-14 (×3): qty 30, 30d supply, fill #1
  Filled ????-??-??: fill #1

## 2024-08-28 NOTE — Telephone Encounter (Signed)
 OK per DPR, left detailed message on VM. Advised pt to please return call should she have any questions. Will attempt to follow up with patient tomorrow for confirmation she received the message and started new Rx.

## 2024-08-28 NOTE — Telephone Encounter (Signed)
-----   Message from Adine Manly, MD sent at 08/28/2024 11:56 AM EST ----- Please notify the patient that we need to change her antibiotics based on the urine culture.  I have sent in a prescription for Cipro  x 7 days for treatment of a UTI.  Following the Cipro , I would  like for her to begin daily Macrobid  for UTI prevention.  Prescription sent.

## 2024-08-29 ENCOUNTER — Other Ambulatory Visit: Payer: Self-pay

## 2024-08-30 ENCOUNTER — Ambulatory Visit: Admitting: Cardiology

## 2024-09-01 ENCOUNTER — Other Ambulatory Visit: Payer: Self-pay | Admitting: Family Medicine

## 2024-09-01 ENCOUNTER — Other Ambulatory Visit: Payer: Self-pay

## 2024-09-01 ENCOUNTER — Other Ambulatory Visit (HOSPITAL_COMMUNITY): Payer: Self-pay

## 2024-09-01 DIAGNOSIS — R8290 Unspecified abnormal findings in urine: Secondary | ICD-10-CM

## 2024-09-01 MED ORDER — METOPROLOL SUCCINATE ER 25 MG PO TB24
25.0000 mg | ORAL_TABLET | Freq: Every day | ORAL | 1 refills | Status: AC
  Filled 2024-09-01: qty 90, 90d supply, fill #0
  Filled 2024-09-04: qty 30, 30d supply, fill #0
  Filled 2024-09-14: qty 90, 90d supply, fill #0

## 2024-09-04 ENCOUNTER — Other Ambulatory Visit: Payer: Self-pay

## 2024-09-13 ENCOUNTER — Other Ambulatory Visit: Payer: Self-pay

## 2024-09-14 ENCOUNTER — Other Ambulatory Visit: Payer: Self-pay | Admitting: Urology

## 2024-09-14 ENCOUNTER — Other Ambulatory Visit: Payer: Self-pay | Admitting: Family Medicine

## 2024-09-14 ENCOUNTER — Other Ambulatory Visit: Payer: Self-pay

## 2024-09-14 DIAGNOSIS — R8290 Unspecified abnormal findings in urine: Secondary | ICD-10-CM

## 2024-09-14 MED ORDER — CIPROFLOXACIN HCL 500 MG PO TABS
500.0000 mg | ORAL_TABLET | Freq: Two times a day (BID) | ORAL | 0 refills | Status: AC
  Filled 2024-09-14: qty 14, 7d supply, fill #0

## 2024-09-14 MED ORDER — NITROFURANTOIN MONOHYD MACRO 100 MG PO CAPS
100.0000 mg | ORAL_CAPSULE | Freq: Every day | ORAL | 2 refills | Status: AC
  Filled 2024-09-14: qty 30, 30d supply, fill #0

## 2024-09-19 ENCOUNTER — Ambulatory Visit: Admitting: Urology

## 2024-10-26 ENCOUNTER — Ambulatory Visit: Payer: Self-pay | Admitting: Family Medicine

## 2024-11-09 ENCOUNTER — Ambulatory Visit: Admitting: Urology
